# Patient Record
Sex: Female | Born: 1945
Health system: Southern US, Community
[De-identification: ages and names within clinical notes are randomized; demographics above are authoritative.]

## PROBLEM LIST (undated history)

## (undated) DIAGNOSIS — R112 Nausea with vomiting, unspecified: Secondary | ICD-10-CM

## (undated) DIAGNOSIS — I200 Unstable angina: Secondary | ICD-10-CM

## (undated) DIAGNOSIS — K649 Unspecified hemorrhoids: Secondary | ICD-10-CM

## (undated) DIAGNOSIS — G709 Myoneural disorder, unspecified: Secondary | ICD-10-CM

## (undated) DIAGNOSIS — Z860100 Personal history of colon polyps, unspecified: Secondary | ICD-10-CM

## (undated) DIAGNOSIS — E039 Hypothyroidism, unspecified: Secondary | ICD-10-CM

## (undated) DIAGNOSIS — T50905S Adverse effect of unspecified drugs, medicaments and biological substances, sequela: Secondary | ICD-10-CM

## (undated) DIAGNOSIS — M1990 Unspecified osteoarthritis, unspecified site: Secondary | ICD-10-CM

## (undated) DIAGNOSIS — H9190 Unspecified hearing loss, unspecified ear: Secondary | ICD-10-CM

## (undated) DIAGNOSIS — E785 Hyperlipidemia, unspecified: Secondary | ICD-10-CM

## (undated) DIAGNOSIS — R233 Spontaneous ecchymoses: Secondary | ICD-10-CM

## (undated) DIAGNOSIS — Z8701 Personal history of pneumonia (recurrent): Secondary | ICD-10-CM

## (undated) DIAGNOSIS — K5792 Diverticulitis of intestine, part unspecified, without perforation or abscess without bleeding: Secondary | ICD-10-CM

## (undated) DIAGNOSIS — R0600 Dyspnea, unspecified: Secondary | ICD-10-CM

## (undated) DIAGNOSIS — F329 Major depressive disorder, single episode, unspecified: Secondary | ICD-10-CM

## (undated) DIAGNOSIS — IMO0002 Reserved for concepts with insufficient information to code with codable children: Secondary | ICD-10-CM

## (undated) DIAGNOSIS — J069 Acute upper respiratory infection, unspecified: Secondary | ICD-10-CM

## (undated) DIAGNOSIS — Q211 Atrial septal defect: Secondary | ICD-10-CM

## (undated) DIAGNOSIS — Z8709 Personal history of other diseases of the respiratory system: Secondary | ICD-10-CM

## (undated) DIAGNOSIS — I10 Essential (primary) hypertension: Secondary | ICD-10-CM

## (undated) DIAGNOSIS — Z9889 Other specified postprocedural states: Secondary | ICD-10-CM

## (undated) DIAGNOSIS — G459 Transient cerebral ischemic attack, unspecified: Secondary | ICD-10-CM

## (undated) DIAGNOSIS — R51 Headache: Secondary | ICD-10-CM

## (undated) DIAGNOSIS — Z8669 Personal history of other diseases of the nervous system and sense organs: Secondary | ICD-10-CM

## (undated) DIAGNOSIS — R238 Other skin changes: Secondary | ICD-10-CM

## (undated) DIAGNOSIS — C50919 Malignant neoplasm of unspecified site of unspecified female breast: Secondary | ICD-10-CM

## (undated) DIAGNOSIS — J40 Bronchitis, not specified as acute or chronic: Secondary | ICD-10-CM

## (undated) DIAGNOSIS — R109 Unspecified abdominal pain: Secondary | ICD-10-CM

## (undated) DIAGNOSIS — R42 Dizziness and giddiness: Secondary | ICD-10-CM

## (undated) DIAGNOSIS — J189 Pneumonia, unspecified organism: Secondary | ICD-10-CM

## (undated) DIAGNOSIS — G4730 Sleep apnea, unspecified: Secondary | ICD-10-CM

## (undated) DIAGNOSIS — F32A Depression, unspecified: Secondary | ICD-10-CM

## (undated) DIAGNOSIS — K5732 Diverticulitis of large intestine without perforation or abscess without bleeding: Secondary | ICD-10-CM

## (undated) DIAGNOSIS — I639 Cerebral infarction, unspecified: Secondary | ICD-10-CM

## (undated) DIAGNOSIS — R002 Palpitations: Secondary | ICD-10-CM

## (undated) DIAGNOSIS — R351 Nocturia: Secondary | ICD-10-CM

## (undated) DIAGNOSIS — M542 Cervicalgia: Secondary | ICD-10-CM

## (undated) DIAGNOSIS — M47812 Spondylosis without myelopathy or radiculopathy, cervical region: Secondary | ICD-10-CM

## (undated) DIAGNOSIS — J029 Acute pharyngitis, unspecified: Secondary | ICD-10-CM

## (undated) DIAGNOSIS — Q2112 Patent foramen ovale: Secondary | ICD-10-CM

## (undated) HISTORY — DX: Hypothyroidism, unspecified: E03.9

## (undated) HISTORY — PX: BACK SURGERY: SHX140

## (undated) HISTORY — DX: Adverse effect of unspecified drugs, medicaments and biological substances, sequela: T50.905S

## (undated) HISTORY — DX: Personal history of other diseases of the nervous system and sense organs: Z86.69

## (undated) HISTORY — DX: Personal history of colon polyps, unspecified: Z86.0100

## (undated) HISTORY — DX: Patent foramen ovale: Q21.12

## (undated) HISTORY — DX: Diverticulitis of intestine, part unspecified, without perforation or abscess without bleeding: K57.92

## (undated) HISTORY — DX: Spondylosis without myelopathy or radiculopathy, cervical region: M47.812

## (undated) HISTORY — DX: Essential (primary) hypertension: I10

## (undated) HISTORY — DX: Atrial septal defect: Q21.1

## (undated) HISTORY — DX: Cerebral infarction, unspecified: I63.9

## (undated) HISTORY — DX: Malignant neoplasm of unspecified site of unspecified female breast: C50.919

## (undated) HISTORY — PX: UMBILICAL HERNIA REPAIR: SHX2598

## (undated) HISTORY — PX: ABDOMINAL HYSTERECTOMY: SHX81

## (undated) HISTORY — DX: Unstable angina: I20.0

## (undated) HISTORY — PX: EYE SURGERY: SHX253

## (undated) HISTORY — DX: Hyperlipidemia, unspecified: E78.5

## (undated) HISTORY — DX: Palpitations: R00.2

## (undated) HISTORY — DX: Personal history of colonic polyps: Z86.010

## (undated) HISTORY — DX: Unspecified osteoarthritis, unspecified site: M19.90

---

## 1981-09-19 HISTORY — PX: TUBAL LIGATION: SHX77

## 1998-12-08 ENCOUNTER — Encounter: Payer: Self-pay | Admitting: Surgery

## 1998-12-08 ENCOUNTER — Ambulatory Visit (HOSPITAL_COMMUNITY): Admission: RE | Admit: 1998-12-08 | Discharge: 1998-12-08 | Payer: Self-pay | Admitting: Surgery

## 1999-08-09 ENCOUNTER — Encounter: Admission: RE | Admit: 1999-08-09 | Discharge: 1999-08-09 | Payer: Self-pay | Admitting: Urology

## 1999-08-09 ENCOUNTER — Encounter: Payer: Self-pay | Admitting: Urology

## 2000-01-13 ENCOUNTER — Ambulatory Visit (HOSPITAL_COMMUNITY): Admission: RE | Admit: 2000-01-13 | Discharge: 2000-01-13 | Payer: Self-pay | Admitting: Surgery

## 2000-01-13 ENCOUNTER — Encounter: Payer: Self-pay | Admitting: Surgery

## 2000-01-21 ENCOUNTER — Ambulatory Visit: Admission: RE | Admit: 2000-01-21 | Discharge: 2000-01-21 | Payer: Self-pay | Admitting: Otolaryngology

## 2001-01-15 ENCOUNTER — Ambulatory Visit (HOSPITAL_COMMUNITY): Admission: RE | Admit: 2001-01-15 | Discharge: 2001-01-15 | Payer: Self-pay | Admitting: *Deleted

## 2001-01-15 ENCOUNTER — Encounter: Payer: Self-pay | Admitting: *Deleted

## 2001-07-31 ENCOUNTER — Other Ambulatory Visit: Admission: RE | Admit: 2001-07-31 | Discharge: 2001-07-31 | Payer: Self-pay | Admitting: *Deleted

## 2002-01-03 ENCOUNTER — Ambulatory Visit (HOSPITAL_COMMUNITY): Admission: RE | Admit: 2002-01-03 | Discharge: 2002-01-03 | Payer: Self-pay | Admitting: Orthopedic Surgery

## 2002-01-03 ENCOUNTER — Encounter: Payer: Self-pay | Admitting: Orthopedic Surgery

## 2002-09-10 ENCOUNTER — Ambulatory Visit (HOSPITAL_COMMUNITY): Admission: RE | Admit: 2002-09-10 | Discharge: 2002-09-10 | Payer: Self-pay | Admitting: *Deleted

## 2002-09-10 ENCOUNTER — Encounter: Payer: Self-pay | Admitting: *Deleted

## 2002-10-23 ENCOUNTER — Other Ambulatory Visit: Admission: RE | Admit: 2002-10-23 | Discharge: 2002-10-23 | Payer: Self-pay | Admitting: *Deleted

## 2002-12-28 ENCOUNTER — Encounter: Payer: Self-pay | Admitting: Orthopedic Surgery

## 2002-12-28 ENCOUNTER — Ambulatory Visit (HOSPITAL_COMMUNITY): Admission: RE | Admit: 2002-12-28 | Discharge: 2002-12-28 | Payer: Self-pay | Admitting: Orthopedic Surgery

## 2003-09-30 ENCOUNTER — Ambulatory Visit (HOSPITAL_COMMUNITY): Admission: RE | Admit: 2003-09-30 | Discharge: 2003-09-30 | Payer: Self-pay | Admitting: *Deleted

## 2003-11-22 ENCOUNTER — Ambulatory Visit (HOSPITAL_COMMUNITY): Admission: RE | Admit: 2003-11-22 | Discharge: 2003-11-22 | Payer: Self-pay | Admitting: Orthopedic Surgery

## 2003-12-25 ENCOUNTER — Other Ambulatory Visit: Admission: RE | Admit: 2003-12-25 | Discharge: 2003-12-25 | Payer: Self-pay | Admitting: *Deleted

## 2004-07-23 ENCOUNTER — Ambulatory Visit (HOSPITAL_COMMUNITY): Admission: RE | Admit: 2004-07-23 | Discharge: 2004-07-23 | Payer: Self-pay | Admitting: Neurosurgery

## 2004-08-09 ENCOUNTER — Ambulatory Visit (HOSPITAL_COMMUNITY): Admission: RE | Admit: 2004-08-09 | Discharge: 2004-08-09 | Payer: Self-pay | Admitting: Neurosurgery

## 2005-01-27 ENCOUNTER — Other Ambulatory Visit: Admission: RE | Admit: 2005-01-27 | Discharge: 2005-01-27 | Payer: Self-pay | Admitting: *Deleted

## 2005-03-02 ENCOUNTER — Ambulatory Visit (HOSPITAL_COMMUNITY): Admission: RE | Admit: 2005-03-02 | Discharge: 2005-03-02 | Payer: Self-pay | Admitting: *Deleted

## 2005-04-11 ENCOUNTER — Ambulatory Visit (HOSPITAL_COMMUNITY): Admission: RE | Admit: 2005-04-11 | Discharge: 2005-04-11 | Payer: Self-pay | Admitting: Neurosurgery

## 2005-04-25 ENCOUNTER — Ambulatory Visit (HOSPITAL_COMMUNITY): Admission: RE | Admit: 2005-04-25 | Discharge: 2005-04-25 | Payer: Self-pay | Admitting: Neurosurgery

## 2005-04-27 ENCOUNTER — Inpatient Hospital Stay (HOSPITAL_COMMUNITY): Admission: EM | Admit: 2005-04-27 | Discharge: 2005-05-01 | Payer: Self-pay | Admitting: Emergency Medicine

## 2005-04-28 ENCOUNTER — Encounter (INDEPENDENT_AMBULATORY_CARE_PROVIDER_SITE_OTHER): Payer: Self-pay | Admitting: *Deleted

## 2005-04-29 ENCOUNTER — Encounter (INDEPENDENT_AMBULATORY_CARE_PROVIDER_SITE_OTHER): Payer: Self-pay | Admitting: Cardiology

## 2005-06-02 ENCOUNTER — Encounter: Payer: Self-pay | Admitting: Internal Medicine

## 2005-06-08 ENCOUNTER — Encounter: Admission: RE | Admit: 2005-06-08 | Discharge: 2005-06-08 | Payer: Self-pay | Admitting: Neurology

## 2005-08-08 ENCOUNTER — Encounter: Payer: Self-pay | Admitting: Internal Medicine

## 2005-08-26 ENCOUNTER — Encounter: Payer: Self-pay | Admitting: Internal Medicine

## 2005-08-26 ENCOUNTER — Ambulatory Visit (HOSPITAL_COMMUNITY): Admission: RE | Admit: 2005-08-26 | Discharge: 2005-08-26 | Payer: Self-pay | Admitting: Cardiology

## 2005-09-27 ENCOUNTER — Ambulatory Visit: Payer: Self-pay | Admitting: Internal Medicine

## 2005-09-27 ENCOUNTER — Encounter: Admission: RE | Admit: 2005-09-27 | Discharge: 2005-09-27 | Payer: Self-pay | Admitting: Internal Medicine

## 2005-10-03 ENCOUNTER — Ambulatory Visit: Payer: Self-pay | Admitting: Internal Medicine

## 2005-10-07 ENCOUNTER — Encounter: Admission: RE | Admit: 2005-10-07 | Discharge: 2006-01-05 | Payer: Self-pay | Admitting: Internal Medicine

## 2005-11-01 ENCOUNTER — Ambulatory Visit: Payer: Self-pay | Admitting: Internal Medicine

## 2005-12-21 ENCOUNTER — Ambulatory Visit: Payer: Self-pay | Admitting: Internal Medicine

## 2006-01-20 ENCOUNTER — Ambulatory Visit: Payer: Self-pay | Admitting: Internal Medicine

## 2006-03-14 ENCOUNTER — Encounter: Admission: RE | Admit: 2006-03-14 | Discharge: 2006-03-14 | Payer: Self-pay | Admitting: *Deleted

## 2006-03-15 ENCOUNTER — Other Ambulatory Visit: Admission: RE | Admit: 2006-03-15 | Discharge: 2006-03-15 | Payer: Self-pay | Admitting: *Deleted

## 2006-06-20 ENCOUNTER — Ambulatory Visit: Payer: Self-pay | Admitting: Internal Medicine

## 2006-07-26 ENCOUNTER — Ambulatory Visit: Payer: Self-pay | Admitting: Internal Medicine

## 2006-08-24 ENCOUNTER — Encounter: Admission: RE | Admit: 2006-08-24 | Discharge: 2006-08-24 | Payer: Self-pay | Admitting: Orthopedic Surgery

## 2006-09-06 ENCOUNTER — Encounter: Admission: RE | Admit: 2006-09-06 | Discharge: 2006-10-25 | Payer: Self-pay | Admitting: Orthopedic Surgery

## 2006-11-13 ENCOUNTER — Ambulatory Visit: Payer: Self-pay | Admitting: Internal Medicine

## 2007-01-17 ENCOUNTER — Encounter: Admission: RE | Admit: 2007-01-17 | Discharge: 2007-01-17 | Payer: Self-pay | Admitting: *Deleted

## 2007-02-23 ENCOUNTER — Ambulatory Visit: Payer: Self-pay | Admitting: Internal Medicine

## 2007-02-28 ENCOUNTER — Encounter: Payer: Self-pay | Admitting: Internal Medicine

## 2007-03-06 DIAGNOSIS — M545 Low back pain: Secondary | ICD-10-CM | POA: Insufficient documentation

## 2007-03-06 DIAGNOSIS — I10 Essential (primary) hypertension: Secondary | ICD-10-CM

## 2007-03-06 DIAGNOSIS — E785 Hyperlipidemia, unspecified: Secondary | ICD-10-CM | POA: Insufficient documentation

## 2007-03-06 DIAGNOSIS — E039 Hypothyroidism, unspecified: Secondary | ICD-10-CM

## 2007-03-06 DIAGNOSIS — Z8679 Personal history of other diseases of the circulatory system: Secondary | ICD-10-CM | POA: Insufficient documentation

## 2007-03-09 ENCOUNTER — Ambulatory Visit: Payer: Self-pay | Admitting: Internal Medicine

## 2007-04-09 ENCOUNTER — Other Ambulatory Visit: Admission: RE | Admit: 2007-04-09 | Discharge: 2007-04-09 | Payer: Self-pay | Admitting: *Deleted

## 2007-04-11 ENCOUNTER — Encounter: Payer: Self-pay | Admitting: Internal Medicine

## 2007-04-11 ENCOUNTER — Encounter: Admission: RE | Admit: 2007-04-11 | Discharge: 2007-04-11 | Payer: Self-pay | Admitting: *Deleted

## 2007-04-18 ENCOUNTER — Encounter: Payer: Self-pay | Admitting: Internal Medicine

## 2007-04-18 ENCOUNTER — Encounter: Admission: RE | Admit: 2007-04-18 | Discharge: 2007-04-18 | Payer: Self-pay | Admitting: *Deleted

## 2007-04-20 ENCOUNTER — Ambulatory Visit: Payer: Self-pay | Admitting: Internal Medicine

## 2007-04-20 DIAGNOSIS — T887XXA Unspecified adverse effect of drug or medicament, initial encounter: Secondary | ICD-10-CM | POA: Insufficient documentation

## 2007-04-30 LAB — CONVERTED CEMR LAB
AST: 19 units/L (ref 0–37)
Cholesterol: 165 mg/dL (ref 0–200)
TSH: 0.5 microintl units/mL (ref 0.35–5.50)

## 2007-05-23 ENCOUNTER — Encounter: Payer: Self-pay | Admitting: Internal Medicine

## 2007-05-30 ENCOUNTER — Encounter: Payer: Self-pay | Admitting: Internal Medicine

## 2007-06-04 ENCOUNTER — Encounter: Payer: Self-pay | Admitting: Internal Medicine

## 2007-07-02 ENCOUNTER — Telehealth: Payer: Self-pay | Admitting: Internal Medicine

## 2007-07-23 ENCOUNTER — Ambulatory Visit: Payer: Self-pay | Admitting: Internal Medicine

## 2007-07-23 DIAGNOSIS — Z860100 Personal history of colon polyps, unspecified: Secondary | ICD-10-CM | POA: Insufficient documentation

## 2007-09-04 ENCOUNTER — Encounter: Payer: Self-pay | Admitting: Internal Medicine

## 2007-09-11 ENCOUNTER — Encounter: Payer: Self-pay | Admitting: Internal Medicine

## 2007-09-27 ENCOUNTER — Telehealth: Payer: Self-pay | Admitting: Internal Medicine

## 2007-09-28 ENCOUNTER — Ambulatory Visit: Payer: Self-pay | Admitting: Internal Medicine

## 2007-09-28 DIAGNOSIS — G459 Transient cerebral ischemic attack, unspecified: Secondary | ICD-10-CM | POA: Insufficient documentation

## 2007-09-28 HISTORY — DX: Transient cerebral ischemic attack, unspecified: G45.9

## 2007-11-20 ENCOUNTER — Ambulatory Visit: Payer: Self-pay | Admitting: Internal Medicine

## 2007-11-20 DIAGNOSIS — R1310 Dysphagia, unspecified: Secondary | ICD-10-CM

## 2007-11-22 ENCOUNTER — Encounter: Payer: Self-pay | Admitting: Internal Medicine

## 2007-11-23 ENCOUNTER — Encounter: Payer: Self-pay | Admitting: Internal Medicine

## 2007-11-26 ENCOUNTER — Encounter: Payer: Self-pay | Admitting: Internal Medicine

## 2007-11-28 ENCOUNTER — Encounter: Payer: Self-pay | Admitting: Internal Medicine

## 2007-12-11 ENCOUNTER — Ambulatory Visit: Payer: Self-pay | Admitting: Internal Medicine

## 2007-12-11 DIAGNOSIS — L723 Sebaceous cyst: Secondary | ICD-10-CM

## 2008-01-08 ENCOUNTER — Encounter: Payer: Self-pay | Admitting: Internal Medicine

## 2008-01-18 ENCOUNTER — Encounter: Payer: Self-pay | Admitting: Internal Medicine

## 2008-01-21 ENCOUNTER — Ambulatory Visit: Payer: Self-pay | Admitting: Internal Medicine

## 2008-01-30 ENCOUNTER — Encounter: Admission: RE | Admit: 2008-01-30 | Discharge: 2008-01-30 | Payer: Self-pay | Admitting: Neurology

## 2008-02-06 ENCOUNTER — Ambulatory Visit: Payer: Self-pay | Admitting: Internal Medicine

## 2008-02-12 ENCOUNTER — Ambulatory Visit: Payer: Self-pay | Admitting: Internal Medicine

## 2008-02-12 DIAGNOSIS — G44209 Tension-type headache, unspecified, not intractable: Secondary | ICD-10-CM

## 2008-02-14 ENCOUNTER — Telehealth (INDEPENDENT_AMBULATORY_CARE_PROVIDER_SITE_OTHER): Payer: Self-pay

## 2008-02-15 ENCOUNTER — Emergency Department (HOSPITAL_COMMUNITY): Admission: EM | Admit: 2008-02-15 | Discharge: 2008-02-16 | Payer: Self-pay | Admitting: Emergency Medicine

## 2008-02-27 ENCOUNTER — Ambulatory Visit: Payer: Self-pay | Admitting: Internal Medicine

## 2008-02-28 DIAGNOSIS — R51 Headache: Secondary | ICD-10-CM

## 2008-03-03 ENCOUNTER — Encounter: Payer: Self-pay | Admitting: Internal Medicine

## 2008-04-07 ENCOUNTER — Telehealth: Payer: Self-pay | Admitting: Internal Medicine

## 2008-04-11 ENCOUNTER — Encounter: Admission: RE | Admit: 2008-04-11 | Discharge: 2008-04-11 | Payer: Self-pay | Admitting: Gynecology

## 2008-04-23 ENCOUNTER — Encounter: Payer: Self-pay | Admitting: Internal Medicine

## 2008-04-28 ENCOUNTER — Encounter: Payer: Self-pay | Admitting: Internal Medicine

## 2008-04-28 ENCOUNTER — Other Ambulatory Visit: Admission: RE | Admit: 2008-04-28 | Discharge: 2008-04-28 | Payer: Self-pay | Admitting: Gynecology

## 2008-05-15 ENCOUNTER — Ambulatory Visit: Payer: Self-pay | Admitting: Internal Medicine

## 2008-05-15 DIAGNOSIS — R599 Enlarged lymph nodes, unspecified: Secondary | ICD-10-CM | POA: Insufficient documentation

## 2008-06-13 ENCOUNTER — Encounter: Payer: Self-pay | Admitting: Internal Medicine

## 2008-07-10 ENCOUNTER — Telehealth: Payer: Self-pay | Admitting: Family Medicine

## 2008-07-15 ENCOUNTER — Telehealth: Payer: Self-pay | Admitting: Internal Medicine

## 2008-09-02 ENCOUNTER — Ambulatory Visit: Payer: Self-pay | Admitting: Internal Medicine

## 2008-09-02 DIAGNOSIS — J069 Acute upper respiratory infection, unspecified: Secondary | ICD-10-CM | POA: Insufficient documentation

## 2008-09-02 HISTORY — DX: Acute upper respiratory infection, unspecified: J06.9

## 2008-09-04 ENCOUNTER — Telehealth (INDEPENDENT_AMBULATORY_CARE_PROVIDER_SITE_OTHER): Payer: Self-pay

## 2008-09-23 ENCOUNTER — Telehealth: Payer: Self-pay | Admitting: Internal Medicine

## 2008-09-24 ENCOUNTER — Telehealth: Payer: Self-pay | Admitting: Internal Medicine

## 2008-09-30 ENCOUNTER — Encounter: Payer: Self-pay | Admitting: Internal Medicine

## 2008-10-01 ENCOUNTER — Encounter: Admission: RE | Admit: 2008-10-01 | Discharge: 2008-10-01 | Payer: Self-pay | Admitting: Gastroenterology

## 2008-10-03 ENCOUNTER — Encounter: Payer: Self-pay | Admitting: Internal Medicine

## 2008-10-14 ENCOUNTER — Encounter: Payer: Self-pay | Admitting: Internal Medicine

## 2008-10-15 ENCOUNTER — Encounter: Payer: Self-pay | Admitting: Internal Medicine

## 2008-12-24 ENCOUNTER — Encounter: Payer: Self-pay | Admitting: Internal Medicine

## 2008-12-24 ENCOUNTER — Encounter: Admission: RE | Admit: 2008-12-24 | Discharge: 2008-12-24 | Payer: Self-pay | Admitting: Gastroenterology

## 2008-12-30 ENCOUNTER — Encounter: Payer: Self-pay | Admitting: Internal Medicine

## 2009-01-14 ENCOUNTER — Encounter: Payer: Self-pay | Admitting: Internal Medicine

## 2009-01-28 ENCOUNTER — Encounter: Payer: Self-pay | Admitting: Internal Medicine

## 2009-04-13 ENCOUNTER — Encounter: Admission: RE | Admit: 2009-04-13 | Discharge: 2009-04-13 | Payer: Self-pay | Admitting: Gynecology

## 2009-04-30 ENCOUNTER — Ambulatory Visit: Payer: Self-pay | Admitting: Internal Medicine

## 2009-04-30 DIAGNOSIS — J029 Acute pharyngitis, unspecified: Secondary | ICD-10-CM

## 2009-04-30 HISTORY — DX: Acute pharyngitis, unspecified: J02.9

## 2009-04-30 LAB — CONVERTED CEMR LAB: Rapid Strep: NEGATIVE

## 2009-05-07 ENCOUNTER — Encounter: Payer: Self-pay | Admitting: Internal Medicine

## 2009-06-01 ENCOUNTER — Encounter (INDEPENDENT_AMBULATORY_CARE_PROVIDER_SITE_OTHER): Payer: Self-pay

## 2009-08-03 ENCOUNTER — Telehealth: Payer: Self-pay | Admitting: Internal Medicine

## 2009-08-04 ENCOUNTER — Ambulatory Visit: Payer: Self-pay | Admitting: Internal Medicine

## 2009-08-04 LAB — CONVERTED CEMR LAB: Rapid Strep: NEGATIVE

## 2009-08-10 ENCOUNTER — Telehealth: Payer: Self-pay | Admitting: Internal Medicine

## 2009-08-20 ENCOUNTER — Encounter (INDEPENDENT_AMBULATORY_CARE_PROVIDER_SITE_OTHER): Payer: Self-pay | Admitting: *Deleted

## 2009-10-02 ENCOUNTER — Encounter: Payer: Self-pay | Admitting: Internal Medicine

## 2009-10-14 ENCOUNTER — Telehealth: Payer: Self-pay | Admitting: Internal Medicine

## 2009-10-19 ENCOUNTER — Encounter: Payer: Self-pay | Admitting: Internal Medicine

## 2009-12-28 ENCOUNTER — Ambulatory Visit: Payer: Self-pay | Admitting: Internal Medicine

## 2010-02-10 ENCOUNTER — Ambulatory Visit: Payer: Self-pay | Admitting: Internal Medicine

## 2010-02-10 DIAGNOSIS — K5732 Diverticulitis of large intestine without perforation or abscess without bleeding: Secondary | ICD-10-CM

## 2010-02-10 HISTORY — DX: Diverticulitis of large intestine without perforation or abscess without bleeding: K57.32

## 2010-02-10 LAB — CONVERTED CEMR LAB
Basophils Absolute: 0 10*3/uL (ref 0.0–0.1)
Chloride: 104 meq/L (ref 96–112)
Creatinine, Ser: 0.7 mg/dL (ref 0.4–1.2)
Eosinophils Absolute: 0 10*3/uL (ref 0.0–0.7)
Lymphocytes Relative: 21.2 % (ref 12.0–46.0)
MCHC: 35 g/dL (ref 30.0–36.0)
Neutro Abs: 6.5 10*3/uL (ref 1.4–7.7)
Neutrophils Relative %: 70.7 % (ref 43.0–77.0)
Platelets: 174 10*3/uL (ref 150.0–400.0)
RDW: 13.2 % (ref 11.5–14.6)
Sodium: 141 meq/L (ref 135–145)

## 2010-02-12 ENCOUNTER — Ambulatory Visit: Payer: Self-pay | Admitting: Internal Medicine

## 2010-04-15 ENCOUNTER — Encounter: Admission: RE | Admit: 2010-04-15 | Discharge: 2010-04-15 | Payer: Self-pay | Admitting: Gynecology

## 2010-05-06 ENCOUNTER — Encounter: Payer: Self-pay | Admitting: Internal Medicine

## 2010-05-07 ENCOUNTER — Ambulatory Visit: Payer: Self-pay | Admitting: Internal Medicine

## 2010-05-20 ENCOUNTER — Telehealth: Payer: Self-pay | Admitting: Internal Medicine

## 2010-05-20 DIAGNOSIS — R079 Chest pain, unspecified: Secondary | ICD-10-CM

## 2010-05-27 ENCOUNTER — Ambulatory Visit: Payer: Self-pay | Admitting: Internal Medicine

## 2010-05-27 DIAGNOSIS — R002 Palpitations: Secondary | ICD-10-CM | POA: Insufficient documentation

## 2010-05-28 ENCOUNTER — Encounter: Payer: Self-pay | Admitting: Internal Medicine

## 2010-06-01 ENCOUNTER — Telehealth (INDEPENDENT_AMBULATORY_CARE_PROVIDER_SITE_OTHER): Payer: Self-pay | Admitting: *Deleted

## 2010-06-08 ENCOUNTER — Ambulatory Visit: Payer: Self-pay | Admitting: Internal Medicine

## 2010-06-08 ENCOUNTER — Encounter: Payer: Self-pay | Admitting: Internal Medicine

## 2010-06-08 DIAGNOSIS — R072 Precordial pain: Secondary | ICD-10-CM

## 2010-06-09 LAB — CONVERTED CEMR LAB
Chloride: 108 meq/L (ref 96–112)
Eosinophils Relative: 3.9 % (ref 0.0–5.0)
GFR calc non Af Amer: 88.69 mL/min (ref >60)
Glucose, Bld: 88 mg/dL (ref 70–99)
HCT: 36.9 % (ref 36.0–46.0)
Hemoglobin: 12.7 g/dL (ref 12.0–15.0)
Lymphs Abs: 2.2 10*3/uL (ref 0.7–4.0)
Monocytes Relative: 8 % (ref 3.0–12.0)
Neutro Abs: 2.9 10*3/uL (ref 1.4–7.7)
Potassium: 3.9 meq/L (ref 3.5–5.1)
RDW: 14 % (ref 11.5–14.6)
Sodium: 140 meq/L (ref 135–145)
WBC: 5.8 10*3/uL (ref 4.5–10.5)

## 2010-06-11 ENCOUNTER — Inpatient Hospital Stay (HOSPITAL_BASED_OUTPATIENT_CLINIC_OR_DEPARTMENT_OTHER): Admission: RE | Admit: 2010-06-11 | Discharge: 2010-06-11 | Payer: Self-pay | Admitting: Internal Medicine

## 2010-06-11 ENCOUNTER — Ambulatory Visit: Payer: Self-pay | Admitting: Internal Medicine

## 2010-06-17 ENCOUNTER — Ambulatory Visit: Payer: Self-pay

## 2010-06-17 ENCOUNTER — Ambulatory Visit (HOSPITAL_COMMUNITY): Admission: RE | Admit: 2010-06-17 | Discharge: 2010-06-17 | Payer: Self-pay | Admitting: Internal Medicine

## 2010-06-17 ENCOUNTER — Ambulatory Visit: Payer: Self-pay | Admitting: Cardiology

## 2010-07-07 ENCOUNTER — Telehealth: Payer: Self-pay | Admitting: Internal Medicine

## 2010-07-08 ENCOUNTER — Encounter: Payer: Self-pay | Admitting: Internal Medicine

## 2010-07-14 ENCOUNTER — Telehealth: Payer: Self-pay | Admitting: Internal Medicine

## 2010-07-29 ENCOUNTER — Encounter: Payer: Self-pay | Admitting: Internal Medicine

## 2010-08-18 ENCOUNTER — Encounter: Payer: Self-pay | Admitting: Internal Medicine

## 2010-08-24 ENCOUNTER — Telehealth: Payer: Self-pay

## 2010-08-30 ENCOUNTER — Telehealth: Payer: Self-pay | Admitting: Internal Medicine

## 2010-09-21 ENCOUNTER — Ambulatory Visit
Admission: RE | Admit: 2010-09-21 | Discharge: 2010-09-21 | Payer: Self-pay | Source: Home / Self Care | Attending: Internal Medicine | Admitting: Internal Medicine

## 2010-09-21 DIAGNOSIS — M542 Cervicalgia: Secondary | ICD-10-CM

## 2010-09-21 DIAGNOSIS — R109 Unspecified abdominal pain: Secondary | ICD-10-CM | POA: Insufficient documentation

## 2010-09-21 HISTORY — DX: Unspecified abdominal pain: R10.9

## 2010-09-21 HISTORY — DX: Cervicalgia: M54.2

## 2010-10-19 NOTE — Progress Notes (Signed)
Summary: labs from gyn  Phone Note Call from Patient   Caller: Patient Call For: Gordy Savers  MD Reason for Call: Lab or Test Results Summary of Call: we should of recv'd labs from gyn appt - faxed on 12/2 - tsh medication needs to be adjusted per pt.  [facility].   Initial call taken by: Duard Brady LPN,  August 24, 2010 5:13 PM  Follow-up for Phone Call        I have not seen any fax r/t to this results - please advise Follow-up by: Duard Brady LPN,  August 24, 2010 5:15 PM  Additional Follow-up for Phone Call Additional follow up Details #1::        ask gyn to fax lab please Additional Follow-up by: Gordy Savers  MD,  August 24, 2010 5:38 PM    Additional Follow-up for Phone Call Additional follow up Details #2::    attempt to call pt - ans mach - LMTCB - need name and number for GYN to obtain labs - still have not recv'd - ok to leav info on VM. KIK Follow-up by: Duard Brady LPN,  August 27, 2010 1:34 PM

## 2010-10-19 NOTE — Progress Notes (Signed)
Summary: monitor  Phone Note Outgoing Call Call back at [facility] Phone [phone]   Call placed by: Marcos Eke,  June 01, 2010 4:54 PM Action Taken: Appt scheduled Summary of Call: Pt will have monitor put on the same day of Lab 06/08/10 .

## 2010-10-19 NOTE — Assessment & Plan Note (Signed)
Summary: np6/ chestpain. pt has medicare, blue cross state. gd   Visit Type:  Initial Consult Primary Provider:  Gordy Savers  MD  CC:  shortness of breath.  History of Present Illness: 65 y/o with h/p HTN, HL, chronic HAs, CVA hypothyroidism and depression. Referred by Dr. Amador Cunas for further evalaution of CP.  Does not have a known h/o CAD. Has never had a cath. Had exercise treadmill test in 2009 with Dr. Jacinto Halim which was normal.   Had bicerberal CVA in 2006 and found to have PFO on TEE. Referred to Dr. Jacinto Halim but he was unable to close PFO due to small size. In 2009 was having episodes of dizziness and being pulled to her left side. There was question of vertebrobasilar TIAs. MRI mildly abnormal.  Over past year, has had episodes where she feels like she is having an out of body experience. Chest gets tight and she can't breathe. Feels her heart racing. Then feels presyncopal. Episodes occur 2-3x/week. Can come and go at any time. No nocturnal episdoes. Walks on treadmill for 3-4x week. Occasional dyspneic but hasn't hadone of her episodes while walking on a treadmill. Feels like episodes are getting more frequent. Denies panic attacks. Has worn a heart monitor for AF surveillance in 2009 and it was normal.   Preventive Screening-Counseling & Management  Alcohol-Tobacco     Smoking Status: quit  Caffeine-Diet-Exercise     Does Patient Exercise: yes      Drug Use:  yes.    Problems Prior to Update: 1)  Palpitations  (ICD-785.1) 2)  Chest Pain Unspecified  (ICD-786.50) 3)  Diverticulitis, Acute  (ICD-562.11) 4)  Sore Throat  (ICD-462) 5)  Uri  (ICD-465.9) 6)  Cervical Lymphadenopathy, Right  (ICD-785.6) 7)  Headache  (ICD-784.0) 8)  Tension Headache  (ICD-307.81) 9)  Sebaceous Cyst, Infected  (ICD-706.2) 10)  Sebaceous Cyst, Neck  (ICD-706.2) 11)  Problems With Swallowing and Mastication  (ICD-V41.6) 12)  Tia  (ICD-435.9) 13)  Diverticulosis, Colon   (ICD-562.10) 14)  Colonic Polyps, Hx of  (ICD-V12.72) 15)  Advef, Drug/medicinal/biological Subst Nos  (ICD-995.20) 16)  Hyperlipidemia  (ICD-272.4) 17)  Cerebrovascular Accident, Hx of  (ICD-V12.50) 18)  Low Back Pain  (ICD-724.2) 19)  Hypothyroidism  (ICD-244.9) 20)  Hypertension  (ICD-401.9)  Medications Prior to Update: 1)  Levoxyl 125 Mcg Tabs (Levothyroxine Sodium) .... Take 1 Tablet By Mouth Once A Day 2)  Plavix 75 Mg Tabs (Clopidogrel Bisulfate) .... Take 1 Tablet By Mouth Once A Day 3)  Verapamil Hcl Cr 360 Mg Cp24 (Verapamil Hcl) .... Take 1 Once Daily 4)  Simvastatin 40 Mg  Tabs (Simvastatin) .Marland Kitchen.. 1 Once Daily 5)  Fioricet 50-325-40 Mg  Tabs (Butalbital-Apap-Caffeine) .Marland Kitchen.. 1 Q6h As Needed 6)  Meclizine Hcl 25 Mg Tabs (Meclizine Hcl) .... One Tab Every 4-6 Hours As Needed For Dizziness 7)  Diclofenac Sodium 50 Mg Tbec (Diclofenac Sodium) .... One By Mouth Bid 8)  Tramadol Hcl 50 Mg Tabs (Tramadol Hcl) .... One Every 6 Hours As Needed For Pain 9)  Celebrex 200 Mg Caps (Celecoxib) .... Two Times A Day - Given Samples By Ortho  Current Medications (verified): 1)  Levoxyl 125 Mcg Tabs (Levothyroxine Sodium) .... Take 1 Tablet By Mouth Once A Day 2)  Plavix 75 Mg Tabs (Clopidogrel Bisulfate) .... Take 1 Tablet By Mouth Once A Day 3)  Verapamil Hcl Cr 360 Mg Cp24 (Verapamil Hcl) .... Take 1 Once Daily 4)  Simvastatin 40 Mg  Tabs (  Simvastatin) .Marland Kitchen.. 1 Once Daily 5)  Fioricet 50-325-40 Mg  Tabs (Butalbital-Apap-Caffeine) .Marland Kitchen.. 1 Q6h As Needed 6)  Meclizine Hcl 25 Mg Tabs (Meclizine Hcl) .... One Tab Every 4-6 Hours As Needed For Dizziness 7)  Diclofenac Sodium 50 Mg Tbec (Diclofenac Sodium) .... One By Mouth Two Times A Day When Not On Celebrex 8)  Celebrex 200 Mg Caps (Celecoxib) .... Two Times A Day - Given Samples By Ortho  Allergies (verified): 1)  ! Demerol 2)  Morphine Sulfate (Morphine Sulfate) 3)  Codeine Phosphate (Codeine Phosphate)  Past History:  Family  History: Last updated: 08/03/2007 father died age 72, prostate cancer mother died  52, following a stroke two sisters positive for breast cancer  Social History: Last updated: 05/27/2010 Retired  Married  Tobacco Use - Former.  quit 1960's Alcohol Use - yes -- maybe 2 a month if that Regular Exercise - yes -- treadmill Drug Use - yes -- last in 1980's  Risk Factors: Exercise: yes (05/27/2010)  Risk Factors: Smoking Status: quit (05/27/2010)  Past Medical History: Reviewed history from 05/27/2010 and no changes required. 1. Hypertension 2. Hypothyroidism 3. Low back pain 4. Neck pain 5. Cerebrovascular accident, hx of 06 6. CVD 7. Hyperlipidemia 8. Colonic polyps, hx of 9. Diverticulosis, colon 10. Headache 11. Acute diverticulitis  Past Surgical History: colonoscopy in October 2008 heart catheterization 12 2006 Abdominal Hysterectomy-Total Thyroidectomy Elbow ligament repair  Family History: Reviewed history from 08/03/07 and no changes required. father died age 10, prostate cancer mother died  37, following a stroke two sisters positive for breast cancer  Social History: Reviewed history and no changes required. Retired  Married  Tobacco Use - Former.  quit 1960's Alcohol Use - yes -- maybe 2 a month if that Regular Exercise - yes -- treadmill Drug Use - yes -- last in 1980's Does Patient Exercise:  yes Drug Use:  yes  Review of Systems       As per HPI and past medical history; otherwise all systems negative.   Vital Signs:  Patient profile:   65 year old female Height:      65 inches Weight:      190 pounds BMI:     31.73 Pulse rate:   55 / minute BP sitting:   118 / 78  (right arm) Cuff size:   regular  Vitals Entered By: Hardin Negus, RMA (May 27, 2010 10:53 AM)  Physical Exam  General:  Well appearing. no resp difficulty HEENT: normal Neck: supple. no JVD. Carotids 2+ bilat; no bruits. No lymphadenopathy or thryomegaly  appreciated. Cor: PMI nondisplaced. Regular rate & rhythm. No rubs, gallops, murmur. Lungs: clear Abdomen: soft, nontender, nondistended. No hepatosplenomegaly. No bruits or masses. Good bowel sounds. Extremities: no cyanosis, clubbing, rash, edema Neuro: alert & orientedx3, cranial nerves grossly intact. moves all 4 extremities w/o difficulty. affect flat   Impression & Recommendations:  Problem # 1:  CHEST PAIN UNSPECIFIED (ICD-786.50) and PALPITATIONS Symptoms are mostly atypical but does have some typical features. We discussed repeat stress testing vs cardiac catheterization and she favors cardiac cath for a definitive diagnosis as she is very concerned about her symptoms. We will also place a 2 week event monitor to assess for underlying arrhythmia. We did discuss the possibility that these may be panic attacks but she did not feel that this was the case.   Other Orders: EKG w/ Interpretation (93000) Cardiac Catheterization (Cardiac Cath) Event (Event)  Patient Instructions: 1)  Your physician  recommends that you return for lab work in: week of 9/19 (bmet, cbc, pt 786.51) 2)  Your physician has requested that you have a cardiac catheterization.  Cardiac catheterization is used to diagnose and/or treat various heart conditions. Doctors may recommend this procedure for a number of different reasons. The most common reason is to evaluate chest pain. Chest pain can be a symptom of coronary artery disease (CAD), and cardiac catheterization can show whether plaque is narrowing or blocking your heart's arteries. This procedure is also used to evaluate the valves, as well as measure the blood flow and oxygen levels in different parts of your heart.  For further information please visit [email or web address]  Please follow instruction sheet, as given. 3)  Your physician has recommended that you wear an event monitor.  Event monitors are medical devices that record the heart's electrical activity.  Doctors most often use these monitors to diagnose arrhythmias. Arrhythmias are problems with the speed or rhythm of the heartbeat. The monitor is a small, portable device. You can wear one while you do your normal daily activities. This is usually used to diagnose what is causing palpitations/syncope (passing out).

## 2010-10-19 NOTE — Assessment & Plan Note (Signed)
Summary: 2 day rov/njr   Vital Signs:  Patient profile:   65 year old female Weight:      190 pounds Temp:     98.3 degrees F oral BP sitting:   100 / 70  (right arm) Cuff size:   regular  Vitals Entered By: Duard Brady LPN (Feb 12, 2010 10:47 AM) CC: 2 day f/u - improving Is Patient Diabetic? No   CC:  2 day f/u - improving.  History of Present Illness: 65 year old patient who is seen today for follow-up of acute diverticulitis.  She  presently is on Cipro and metronidazole, which she continues to tolerate.  Her pain is much improved, and she is tolerating a soft diet without difficulty.  There is been no fever or chills.  She is much better compared to two days ago.  White blood cell count at that time was normal.  No nausea or vomiting  Allergies: 1)  ! Demerol 2)  Morphine Sulfate (Morphine Sulfate) 3)  Codeine Phosphate (Codeine Phosphate)  Past History:  Past Medical History: Reviewed history from 02/10/2010 and no changes required. Hypertension Hypothyroidism Low back pain Neck pain Cerebrovascular accident, hx of 06 CVD Hyperlipidemia Colonic polyps, hx of Diverticulosis, colon Headache acute diverticulitis  Review of Systems       The patient complains of abdominal pain.  The patient denies anorexia, fever, weight loss, weight gain, vision loss, decreased hearing, hoarseness, chest pain, syncope, dyspnea on exertion, peripheral edema, prolonged cough, headaches, hemoptysis, melena, hematochezia, severe indigestion/heartburn, hematuria, incontinence, genital sores, muscle weakness, suspicious skin lesions, transient blindness, difficulty walking, depression, unusual weight change, abnormal bleeding, enlarged lymph nodes, angioedema, and breast masses.    Physical Exam  General:  Well-developed,well-nourished,in no acute distress; alert,appropriate and cooperative throughout examination Head:  Normocephalic and atraumatic without obvious abnormalities.  No apparent alopecia or balding. Eyes:  No corneal or conjunctival inflammation noted. EOMI. Perrla. Funduscopic exam benign, without hemorrhages, exudates or papilledema. Vision grossly normal. Mouth:  Oral mucosa and oropharynx without lesions or exudates.  Teeth in good repair. Neck:  No deformities, masses, or tenderness noted. Lungs:  Normal respiratory effort, chest expands symmetrically. Lungs are clear to auscultation, no crackles or wheezes. Heart:  Normal rate and regular rhythm. S1 and S2 normal without gallop, murmur, click, rub or other extra sounds. Abdomen:  still with considerable lower quadrant tenderness, probably most marked in the lower midline.  Bowel sounds remain active.  No guarding is present, but persistent mild rebound tenderness noted Msk:  No deformity or scoliosis noted of thoracic or lumbar spine.     Impression & Recommendations:  Problem # 1:  DIVERTICULITIS, ACUTE (ICD-562.11) modestly improved.  Will continue antibiotic regimen, and slowly advance diet.  If pain courses or she develops fever or chills, will evaluate in the emergency department  Problem # 2:  HYPERTENSION (ICD-401.9)  Her updated medication list for this problem includes:    Verapamil Hcl Cr 360 Mg Cp24 (Verapamil hcl) .Marland Kitchen... Take 1 once daily  Complete Medication List: 1)  Levoxyl 125 Mcg Tabs (Levothyroxine sodium) .... Take 1 tablet by mouth once a day 2)  Plavix 75 Mg Tabs (Clopidogrel bisulfate) .... Take 1 tablet by mouth once a day 3)  Verapamil Hcl Cr 360 Mg Cp24 (Verapamil hcl) .... Take 1 once daily 4)  Simvastatin 40 Mg Tabs (Simvastatin) .Marland Kitchen.. 1 once daily 5)  Fioricet 50-325-40 Mg Tabs (Butalbital-apap-caffeine) .Marland Kitchen.. 1 q6h as needed 6)  Meclizine Hcl 25 Mg Tabs (Meclizine  hcl) .... One tab every 4-6 hours as needed for dizziness 7)  Diclofenac Sodium 50 Mg Tbec (Diclofenac sodium) .... One by mouth bid 8)  Metronidazole 500 Mg Tabs (Metronidazole) .... One tablet 3 times  daily 9)  Tramadol Hcl 50 Mg Tabs (Tramadol hcl) .... One every 6 hours as needed for pain 10)  Cipro 500mg   .... Bid  Patient Instructions: 1)  Drink clear liquids only for the next 24 hours, then slowly add other liquids and food as you  tolerate them. 2)  call if pain worsens or  you  develop fever

## 2010-10-19 NOTE — Letter (Signed)
Summary: [facility]   Imported By: Maryln Gottron 10/27/2009 11:27:36  _____________________________________________________________________  External Attachment:    Type:   Image     Comment:   External Document

## 2010-10-19 NOTE — Cardiovascular Report (Signed)
Summary: Pre Cath Orders   Pre Cath Orders   Imported By: Roderic Ovens 06/07/2010 13:37:26  _____________________________________________________________________  External Attachment:    Type:   Image     Comment:   External Document

## 2010-10-19 NOTE — Assessment & Plan Note (Signed)
Summary: DIVERTICULITIS FLARE UP/CJR   Vital Signs:  Patient profile:   65 year old female Weight:      190 pounds Temp:     100.0 degrees F oral BP sitting:   114 / 70  (left arm) Cuff size:   regular  Vitals Entered By: Duard Brady LPN (Feb 10, 2010 11:13 AM) CC: c/o abd pain - diverticulosis, fever, took 2 fioricet this AM , Gi rx'd cipro yesterday Is Patient Diabetic? No   CC:  c/o abd pain - diverticulosis, fever, took 2 fioricet this AM , and Gi rx'd cipro yesterday.  History of Present Illness: a 65 year old patient who has a history of diverticular disease, who presents with a 3-day history of lower abdominal pain.  There has been some low-grade fever.  Denies any nausea or vomiting or change in her bowel habits.  She has been on Cipro for 3 doses as prescribed by her GI consultant, but has reluctant to take Flagyl due to a history of mild intolerance.  She does not recall exactly her side effects, but clearly did not have a significant allergic reaction.  She states that she would really like to avoid a CT abdominal scan today if possible.  Allergies: 1)  ! Demerol 2)  Morphine Sulfate (Morphine Sulfate) 3)  Codeine Phosphate (Codeine Phosphate)  Past History:  Past Medical History: Hypertension Hypothyroidism Low back pain Neck pain Cerebrovascular accident, hx of 06 CVD Hyperlipidemia Colonic polyps, hx of Diverticulosis, colon Headache acute diverticulitis  Review of Systems       The patient complains of anorexia, fever, and abdominal pain.  The patient denies weight loss, weight gain, vision loss, decreased hearing, hoarseness, chest pain, syncope, dyspnea on exertion, peripheral edema, prolonged cough, headaches, hemoptysis, melena, hematochezia, severe indigestion/heartburn, hematuria, incontinence, genital sores, muscle weakness, suspicious skin lesions, transient blindness, difficulty walking, depression, unusual weight change, abnormal bleeding,  enlarged lymph nodes, angioedema, and breast masses.    Physical Exam  General:  appears unwell, but in no acute distress.  Temperature 100 degrees Head:  Normocephalic and atraumatic without obvious abnormalities. No apparent alopecia or balding. Eyes:  No corneal or conjunctival inflammation noted. EOMI. Perrla. Funduscopic exam benign, without hemorrhages, exudates or papilledema. Vision grossly normal. Mouth:  Oral mucosa and oropharynx without lesions or exudates.  Teeth in good repair. Neck:  No deformities, masses, or tenderness noted. Lungs:  Normal respiratory effort, chest expands symmetrically. Lungs are clear to auscultation, no crackles or wheezes. Heart:  Normal rate and regular rhythm. S1 and S2 normal without gallop, murmur, click, rub or other extra sounds. no tachycardia Abdomen:  no distention.  Bowel sounds are quite active she did have a mild tenderness in the lower abdominal quadrants, left greater than the right or midline; mild rebound tenderness noted.  No guarding   Impression & Recommendations:  Problem # 1:  DIVERTICULITIS, ACUTE (ICD-562.11)  Wilcher with analgesics add metronidazole, and observe closely.  Next 24 hours.  If she is unimproved tomorrow.  Will obtain an abdominal - pelvic CT.  Will check a CBC and also a renal indices anticipating the need for a CT scan  Orders: Prescription Created Electronically [id number]) Venipuncture (60454) Demerol / Phenergan Injection (U9811) Admin of Therapeutic Inj  intramuscular or subcutaneous (91478) TLB-CBC Platelet - w/Differential (85025-CBCD) TLB-BMP (Basic Metabolic Panel-BMET) (80048-METABOL)  Complete Medication List: 1)  Levoxyl 125 Mcg Tabs (Levothyroxine sodium) .... Take 1 tablet by mouth once a day 2)  Plavix 75 Mg Tabs (Clopidogrel  bisulfate) .... Take 1 tablet by mouth once a day 3)  Verapamil Hcl Cr 360 Mg Cp24 (Verapamil hcl) .... Take 1 once daily 4)  Simvastatin 40 Mg Tabs (Simvastatin) .Marland Kitchen.. 1  once daily 5)  Fioricet 50-325-40 Mg Tabs (Butalbital-apap-caffeine) .Marland Kitchen.. 1 q6h as needed 6)  Meclizine Hcl 25 Mg Tabs (Meclizine hcl) .... One tab every 4-6 hours as needed for dizziness 7)  Diclofenac Sodium 50 Mg Tbec (Diclofenac sodium) .... One by mouth bid 8)  Cephalexin 500 Mg Caps (Cephalexin) .... One twice daily 9)  Metronidazole 500 Mg Tabs (Metronidazole) .... One tablet 3 times daily 10)  Tramadol Hcl 50 Mg Tabs (Tramadol hcl) .... One every 6 hours as needed for pain  Patient Instructions: 1)  Drink clear liquids only for the next 24 hours, then slowly add other liquids and food as you  tolerate them. 2)  call immediately if pain or symptoms worsen 3)  return in 48 hours for follow-up 4)  Take your antibiotic as prescribed until ALL of it is gone, but stop if you develop a rash or swelling and contact our office as soon as possible. Prescriptions: TRAMADOL HCL 50 MG TABS (TRAMADOL HCL) one every 6 hours as needed for pain  #50 x 0   Entered and Authorized by:   Gordy Savers  MD   Signed by:   Gordy Savers  MD on 02/10/2010   Method used:   Electronically to        [facility] [phone]* (retail)       [address]       [place], [place]  [address]       Ph: [phone] or [phone]       Fax: [phone]   RxID:   [id number] METRONIDAZOLE 500 MG TABS (METRONIDAZOLE) one tablet 3 times daily  #30 x 0   Entered and Authorized by:   Gordy Savers  MD   Signed by:   Gordy Savers  MD on 02/10/2010   Method used:   Electronically to        [facility] [phone]* (retail)       [address]       [place], [place]  [address]       Ph: [phone] or [phone]       Fax: [phone]   RxID:   [id number]    Medication Administration  Injection # 1:    Medication: Demerol / Phenergan Injection    Diagnosis: DIVERTICULITIS, ACUTE (ICD-562.11)    Route: IM    Site: RUOQ  gluteus    Exp Date: 11/18/2010    Lot #: [id number]    Mfr: [email or web address]    Comments: Demerol 50 / phenergan 50 given    Patient tolerated injection without complications    Given by: Duard Brady LPN (Feb 10, 2010 11:53 AM)  Orders Added: 1)  Prescription Created Electronically [G8553] 2)  Est. Patient Level III [51761] 3)  Venipuncture [60737] 4)  Demerol / Phenergan Injection [J2180] 5)  Admin of Therapeutic Inj  intramuscular or subcutaneous [96372] 6)  TLB-CBC Platelet - w/Differential [85025-CBCD] 7)  TLB-BMP (Basic Metabolic Panel-BMET) [80048-METABOL]

## 2010-10-19 NOTE — Progress Notes (Signed)
Summary: Needs Note for Colonoscopy  Phone Note From Other Clinic Call back at [phone]   Caller: Rene Kocher from [facility] Office  Summary of Call: Needs a note stating if patient can be off plavix a week before 11/10 for scheduled colonoscopy.  Please fax to [phone]  Initial call taken by: Trixie Dredge,  July 07, 2010 9:11 AM  Follow-up for Phone Call        OK Follow-up by: Gordy Savers  MD,  July 07, 2010 12:25 PM  Additional Follow-up for Phone Call Additional follow up Details #1::        letter sent Additional Follow-up by: Trixie Dredge,  July 08, 2010 11:45 AM

## 2010-10-19 NOTE — Procedures (Signed)
Summary: Summary Report  Summary Report   Imported By: Erle Crocker 07/23/2010 10:53:14  _____________________________________________________________________  External Attachment:    Type:   Image     Comment:   External Document

## 2010-10-19 NOTE — Assessment & Plan Note (Signed)
Summary: PAINFUL SWOLLEN LYMPH NODE? (NECK AREA) // RS   Vital Signs:  Patient profile:   65 year old female Weight:      195 pounds Temp:     98.1 degrees F oral BP sitting:   126 / 80  (left arm) Cuff size:   regular  Vitals Entered By: Duard Brady LPN (December 28, 2009 9:18 AM) CC: c/o "knot" to (R) upper clavical area since last monday Is Patient Diabetic? No   CC:  c/o "knot" to (R) upper clavical area since last monday.  History of Present Illness: 65 year old patient who presents with a 7 day history of a painful nodule in theright lateral neck area.  She first noticed this with head turning while she was driver in a vehicle.  She feels this has gotten smaller over the past several days.  No sore throat, fever, or other constitutional complaints.  Her last mammogram was this past summer.  No swallowing difficulty, painful throat.  She has treated hypertension, which has been stable  Preventive Screening-Counseling & Management  Alcohol-Tobacco     Smoking Status: quit  Allergies: 1)  Morphine Sulfate (Morphine Sulfate) 2)  Codeine Phosphate (Codeine Phosphate)  Past History:  Past Medical History: Reviewed history from 02/27/2008 and no changes required. Hypertension Hypothyroidism Low back pain Neck pain Cerebrovascular accident, hx of 06 CVD Hyperlipidemia Colonic polyps, hx of Diverticulosis, colon Headache  Review of Systems  The patient denies anorexia, fever, weight loss, weight gain, vision loss, decreased hearing, hoarseness, chest pain, syncope, dyspnea on exertion, peripheral edema, prolonged cough, headaches, hemoptysis, abdominal pain, melena, hematochezia, severe indigestion/heartburn, hematuria, incontinence, genital sores, muscle weakness, suspicious skin lesions, transient blindness, difficulty walking, depression, unusual weight change, abnormal bleeding, enlarged lymph nodes, angioedema, and breast masses.    Physical Exam  General:   Well-developed,well-nourished,in no acute distress; alert,appropriate and cooperative throughout examination Head:  Normocephalic and atraumatic without obvious abnormalities. No apparent alopecia or balding. Eyes:  No corneal or conjunctival inflammation noted. EOMI. Perrla. Funduscopic exam benign, without hemorrhages, exudates or papilledema. Vision grossly normal. Ears:  External ear exam shows no significant lesions or deformities.  Otoscopic examination reveals clear canals, tympanic membranes are intact bilaterally without bulging, retraction, inflammation or discharge. Hearing is grossly normal bilaterally. Nose:  External nasal examination shows no deformity or inflammation. Nasal mucosa are pink and moist without lesions or exudates. Mouth:  Oral mucosa and oropharynx without lesions or exudates.  Teeth in good repair. Neck:  a small 1 cm round, freely movable tender nodule in the right supraclavicular area Lungs:  Normal respiratory effort, chest expands symmetrically. Lungs are clear to auscultation, no crackles or wheezes.   Impression & Recommendations:  Problem # 1:  CERVICAL LYMPHADENOPATHY, RIGHT (ICD-785.6)  The following medications were removed from the medication list:    Doxycycline Hyclate 100 Mg Caps (Doxycycline hyclate) ..... One twice daily Her updated medication list for this problem includes:    Cephalexin 500 Mg Caps (Cephalexin) ..... One twice daily  Problem # 2:  HYPERTENSION (ICD-401.9)  Her updated medication list for this problem includes:    Verapamil Hcl Cr 360 Mg Cp24 (Verapamil hcl) .Marland Kitchen... Take 1 once daily  Complete Medication List: 1)  Levoxyl 125 Mcg Tabs (Levothyroxine sodium) .... Take 1 tablet by mouth once a day 2)  Plavix 75 Mg Tabs (Clopidogrel bisulfate) .... Take 1 tablet by mouth once a day 3)  Verapamil Hcl Cr 360 Mg Cp24 (Verapamil hcl) .... Take 1  once daily 4)  Simvastatin 40 Mg Tabs (Simvastatin) .Marland Kitchen.. 1 once daily 5)  Fioricet  50-325-40 Mg Tabs (Butalbital-apap-caffeine) .Marland Kitchen.. 1 q6h as needed 6)  Tramadol Hcl 50 Mg Tabs (Tramadol hcl) .Marland Kitchen.. 1 q6h as needed 7)  Meclizine Hcl 25 Mg Tabs (Meclizine hcl) .... One tab every 4-6 hours as needed for dizziness 8)  Diazepam 5 Mg Tabs (Diazepam) .... One tab two times a day 9)  Diclofenac Sodium 50 Mg Tbec (Diclofenac sodium) .... One by mouth bid 10)  Cephalexin 500 Mg Caps (Cephalexin) .... One twice daily  Patient Instructions: 1)  Take your antibiotic as prescribed until ALL of it is gone, but stop if you develop a rash or swelling and contact our office as soon as possible. 2)  Please schedule a follow-up appointment in 3 weeks Prescriptions: CEPHALEXIN 500 MG CAPS (CEPHALEXIN) one twice daily  #20 x 0   Entered and Authorized by:   Gordy Savers  MD   Signed by:   Gordy Savers  MD on 12/28/2009   Method used:   Print then Give to Patient   RxID:   [id number] DOXYCYCLINE HYCLATE 100 MG CAPS (DOXYCYCLINE HYCLATE) one twice daily  #20 x 0   Entered and Authorized by:   Gordy Savers  MD   Signed by:   Gordy Savers  MD on 12/28/2009   Method used:   Print then Give to Patient   RxID:   [id number] DICLOFENAC SODIUM 50 MG TBEC (DICLOFENAC SODIUM) one by mouth bid  #30 Tablet x 3   Entered and Authorized by:   Gordy Savers  MD   Signed by:   Gordy Savers  MD on 12/28/2009   Method used:   Print then Give to Patient   RxID:   [id number]

## 2010-10-19 NOTE — Letter (Signed)
Summary: Cardiac Catheterization Instructions- [facility]  [facility], Main Office  [address]   [place], [place] [address]   Phone: [phone]  Fax: [phone]     05/27/2010 MRN: [id number]  [facility] [address] [place], [place]  [address]  Dear Ms. EARNHART,   You are scheduled for a Cardiac Catheterization on Friday 06/11/10 with Dr. Gala Romney  Please arrive to the 1st floor of the Heart and Vascular Center at [facility] at 7:00 am / pm on the day of your procedure. Please do not arrive before 6:30 a.m. Call the Heart and Vascular Center at [phone] if you are unable to make your appointmnet. The Code to get into the parking garage under the building is 0020. Take the elevators to the 1st floor. You must have someone to drive you home. Someone must be with you for the first 24 hours after you arrive home. Please wear clothes that are easy to get on and off and wear slip-on shoes. Do not eat or drink after midnight except water with your medications that morning. Bring all your medications and current insurance cards with you.  ___ DO NOT take these medications before your procedure: ________________________________________________________________  _X__ Make sure you take your aspirin.  _X__ You may take ALL of your medications with water that morning. ________________________________________________________________________________________________________________________________  ___ DO NOT take ANY medications before your procedure.  ___ Pre-med instructions:  ________________________________________________________________________________________________________________________________  The usual length of stay after your procedure is 2 to 3 hours. This can vary.  If you have any questions, please call the office at the number listed above.   Meredith Staggers, RN

## 2010-10-19 NOTE — Progress Notes (Signed)
Summary: monitor results  Phone Note Outgoing Call   Call placed by: Meredith Staggers, RN,  July 14, 2010 9:34 AM Summary of Call: Dr Gala Romney reviewed monitor, sinus rhythm occ. PACs, have left message for pt to call back   Follow-up for Phone Call        pt aware of results Meredith Staggers, RN  July 14, 2010 11:26 AM

## 2010-10-19 NOTE — Miscellaneous (Signed)
Summary: Appointment Canceled  Appointment status changed to canceled by [employer] on 06/15/2010 12:43 PM.  Cancellation Comments --------------------- echo/ dx: questionable dilated aortic root. gd  Appointment Information ----------------------- Appt Type:  CARDIOLOGY ANCILLARY VISIT      Date:  Wednesday, June 16, 2010      Time:  2:00 PM for 60 min   Urgency:  Routine   Made By:  Hoy Finlay Scheduler  To Visit:  [id number]    Reason:  echo/ dx: questionable dilated aortic root. gd  Appt Comments ------------- -- 06/15/10 12:43: ([facility]) CANCELED -- echo/ dx: questionable dilated aortic root. gd -- 06/11/10 9:38: ([facility]) BOOKED -- Routine CARDIOLOGY ANCILLARY VISIT at 06/16/2010 2:00 PM for 60 min echo/ dx: questionable dilated aortic root. gd

## 2010-10-19 NOTE — Letter (Signed)
Summary: Dr. Micki Riley Office   Dr. Micki Riley Office   Imported By: Marylou Mccoy 06/10/2010 16:45:04  _____________________________________________________________________  External Attachment:    Type:   Image     Comment:   External Document

## 2010-10-19 NOTE — Progress Notes (Signed)
Summary: meclizine refill  Phone Note Call from Patient   Caller: Patient Call For: Gordy Savers  MD Reason for Call: Acute Illness Summary of Call: Pt is asking for refill of Meclizine called to [facility])  Complaining of dizziness.  [phone] Initial call taken by: Lynann Beaver CMA,  October 14, 2009 1:21 PM  Follow-up for Phone Call        generic meclizine 25  #90 use every 6 hrs as needed  RF 5 Follow-up by: Gordy Savers  MD,  October 15, 2009 8:01 AM    Prescriptions: MECLIZINE HCL 25 MG TABS (MECLIZINE HCL) one tab every 4-6 hours as needed for dizziness  #50 x 1   Entered by:   Raechel Ache, RN   Authorized by:   Gordy Savers  MD   Signed by:   Raechel Ache, RN on 10/15/2009   Method used:   [facility] to        [facility] [phone]* (retail)       [address]       [place], [place]  [address]       Ph: [phone] or [phone]       Fax: [phone]   RxID:   [id number]

## 2010-10-19 NOTE — Progress Notes (Signed)
Summary: cardiology referral  Phone Note Call from Patient Call back at Home Phone [phone]   Caller: Patient Call For: Gordy Savers  MD Summary of Call: Pt is asking for a cardiology referral to Dr. Gala Romney Initial call taken by: Lynann Beaver CMA,  May 20, 2010 8:58 AM  Follow-up for Phone Call        ok Follow-up by: Gordy Savers  MD,  May 20, 2010 12:39 PM  New Problems: CHEST PAIN UNSPECIFIED (ICD-786.50)   New Problems: CHEST PAIN UNSPECIFIED (ICD-786.50)

## 2010-10-19 NOTE — Letter (Signed)
[facility]: [facility]   Imported By: Maryln Gottron 10/15/2009 09:15:03  _____________________________________________________________________  External Attachment:    Type:   Image     Comment:   External Document

## 2010-10-19 NOTE — Letter (Signed)
Summary: [facility]   Imported By: Maryln Gottron 06/14/2010 13:04:34  _____________________________________________________________________  External Attachment:    Type:   Image     Comment:   External Document

## 2010-10-19 NOTE — Letter (Signed)
Summary: [facility]  [facility]   Imported By: Maryln Gottron 05/13/2010 15:39:50  _____________________________________________________________________  External Attachment:    Type:   Image     Comment:   External Document

## 2010-10-19 NOTE — Assessment & Plan Note (Signed)
Summary: DIZZINESS // RS  the  Vital Signs:  Patient profile:   65 year old female Weight:      195 pounds Temp:     98.0 degrees F oral BP sitting:   130 / 80  (left arm) Cuff size:   regular  Vitals Entered By: Duard Brady LPN (May 07, 2010 11:24 AM) CC: c/o dizziness on/off x 3 wks Is Patient Diabetic? No   CC:  c/o dizziness on/off x 3 wks.  History of Present Illness: 65 year old patient who is seen today for follow-up.  She has a history of hypertension, dyslipidemia, and cerebrovascular disease.  Three complaints today include some dizziness off and on for the past 3 weeks.  She has had some neck pain, as well as some right shoulder discomfort.  She has seen Dr. Charlett Blake  recently and did receive a cortisone injection.  she has  also been placed on Celebrex twice daily.  Allergies: 1)  ! Demerol 2)  Morphine Sulfate (Morphine Sulfate) 3)  Codeine Phosphate (Codeine Phosphate)  Past History:  Past Medical History: Reviewed history from 02/10/2010 and no changes required. Hypertension Hypothyroidism Low back pain Neck pain Cerebrovascular accident, hx of 06 CVD Hyperlipidemia Colonic polyps, hx of Diverticulosis, colon Headache acute diverticulitis  Review of Systems  The patient denies anorexia, fever, weight loss, weight gain, vision loss, decreased hearing, hoarseness, chest pain, syncope, dyspnea on exertion, peripheral edema, prolonged cough, headaches, hemoptysis, abdominal pain, melena, hematochezia, severe indigestion/heartburn, hematuria, incontinence, genital sores, muscle weakness, suspicious skin lesions, transient blindness, difficulty walking, depression, unusual weight change, abnormal bleeding, enlarged lymph nodes, angioedema, and breast masses.    Physical Exam  General:  Well-developed,well-nourished,in no acute distress; alert,appropriate and cooperative throughout examination Head:  Normocephalic and atraumatic without obvious  abnormalities. No apparent alopecia or balding. Eyes:  No corneal or conjunctival inflammation noted. EOMI. Perrla. Funduscopic exam benign, without hemorrhages, exudates or papilledema. Vision grossly normal. Mouth:  Oral mucosa and oropharynx without lesions or exudates.  Teeth in good repair. Neck:  No deformities, masses, or tenderness noted. Lungs:  Normal respiratory effort, chest expands symmetrically. Lungs are clear to auscultation, no crackles or wheezes. Heart:  Normal rate and regular rhythm. S1 and S2 normal without gallop, murmur, click, rub or other extra sounds. Abdomen:  Bowel sounds positive,abdomen soft and non-tender without masses, organomegaly or hernias noted.   Impression & Recommendations:  Problem # 1:  HYPERTENSION (ICD-401.9)  Her updated medication list for this problem includes:    Verapamil Hcl Cr 360 Mg Cp24 (Verapamil hcl) .Marland Kitchen... Take 1 once daily  Problem # 2:  CEREBROVASCULAR ACCIDENT, HX OF (ICD-V12.50)  Problem # 3:  HYPOTHYROIDISM (ICD-244.9)  Her updated medication list for this problem includes:    Levoxyl 125 Mcg Tabs (Levothyroxine sodium) .Marland Kitchen... Take 1 tablet by mouth once a day  Complete Medication List: 1)  Levoxyl 125 Mcg Tabs (Levothyroxine sodium) .... Take 1 tablet by mouth once a day 2)  Plavix 75 Mg Tabs (Clopidogrel bisulfate) .... Take 1 tablet by mouth once a day 3)  Verapamil Hcl Cr 360 Mg Cp24 (Verapamil hcl) .... Take 1 once daily 4)  Simvastatin 40 Mg Tabs (Simvastatin) .Marland Kitchen.. 1 once daily 5)  Fioricet 50-325-40 Mg Tabs (Butalbital-apap-caffeine) .Marland Kitchen.. 1 q6h as needed 6)  Meclizine Hcl 25 Mg Tabs (Meclizine hcl) .... One tab every 4-6 hours as needed for dizziness 7)  Diclofenac Sodium 50 Mg Tbec (Diclofenac sodium) .... One by mouth bid 8)  Tramadol Hcl  50 Mg Tabs (Tramadol hcl) .... One every 6 hours as needed for pain 9)  Celebrex 200 Mg Caps (Celecoxib) .... Two times a day - given samples by ortho  Patient Instructions: 1)   Please schedule a follow-up appointment in 3 months. 2)  Limit your Sodium (Salt). 3)  It is important that you exercise regularly at least 20 minutes 5 times a week. If you develop chest pain, have severe difficulty breathing, or feel very tired , stop exercising immediately and seek medical attention. Prescriptions: CELEBREX 200 MG CAPS (CELECOXIB) two times a day - given samples by ortho  #180 x 6   Entered and Authorized by:   Gordy Savers  MD   Signed by:   Gordy Savers  MD on 05/07/2010   Method used:   [facility] to        [facility] [phone]* (retail)       [address]       [place], [place]  [address]       Ph: [phone] or [phone]       Fax: [phone]   RxID:   [id number] MECLIZINE HCL 25 MG TABS (MECLIZINE HCL) one tab every 4-6 hours as needed for dizziness  #50 x 1   Entered and Authorized by:   Gordy Savers  MD   Signed by:   Gordy Savers  MD on 05/07/2010   Method used:   [facility] to        [facility] [phone]* (retail)       [address]       [place], [place]  [address]       Ph: [phone] or [phone]       Fax: [phone]   RxID:   [id number] SIMVASTATIN 40 MG  TABS (SIMVASTATIN) 1 once daily  #90 x 6   Entered and Authorized by:   Gordy Savers  MD   Signed by:   Gordy Savers  MD on 05/07/2010   Method used:   Electronically to        [facility] [phone]* (retail)       [address]       [place], [place]  [address]       Ph: [phone] or [phone]       Fax: [phone]   RxID:   [id number] PLAVIX 75 MG TABS (CLOPIDOGREL BISULFATE) Take 1 tablet by mouth once a day  #90 x 5   Entered and Authorized by:   Gordy Savers  MD   Signed by:   Gordy Savers  MD on 05/07/2010   Method used:   Electronically to        [facility] [phone]* (retail)       [address]       [place], [place]  [address]       Ph: [phone] or [phone]       Fax: [phone]   RxID:   [id number] LEVOXYL 125 MCG TABS (LEVOTHYROXINE SODIUM) Take 1 tablet by mouth once a day  #90 Tablet x 4   Entered and Authorized by:   Gordy Savers  MD   Signed by:   Janett Labella  Amador Cunas  MD on 05/07/2010   Method used:   Electronically to        [facility] [phone]* (retail)       [address]       [place], [place]  [address]       Ph: [phone] or [phone]       Fax: [phone]   RxID:   [id number]

## 2010-10-19 NOTE — Letter (Signed)
Summary: Generic Letter  [place] [facility]  [address] [place], [place] [address]   Phone: [phone]  Fax: [phone]    07/08/2010  [facility] [address] [place], [place]  [address]  To Whom It May Concern:  [name] is able to d/c plavix one week prior to colonoscopy scheduled on 07/29/10.       Sincerely,     Eleonore Chiquito, MD

## 2010-10-21 NOTE — Assessment & Plan Note (Signed)
Summary: pain in groin area//ccm   Vital Signs:  Patient profile:   65 year old female Weight:      198 pounds Temp:     98.0 degrees F oral BP sitting:   140 / 74  (right arm) Cuff size:   regular  Vitals Entered By: Duard Brady LPN (September 21, 2010 1:45 PM) CC: (R) inner thigh - check lump , (R) neck - check lump Is Patient Diabetic? No   Primary Care Provider:  Gordy Savers  MD  CC:  (R) inner thigh - check lump  and (R) neck - check lump.  History of Present Illness: 65 year old patient who is seen today with concerns about pain and swelling in the right neck and right groin area.  She noticed pain and swelling approximately  5 days ago that initially started in the right groin area.  She subsequently noticed pain in the right neck area, associated with a palpable lump.  At the present time.  She is having difficult time identifying these nodules.  There is been no fever or other constitutional complaints.  She does treated hypertension, which has been stable.  She has been using Fioricet, and anti-inflammatories with improvement  Allergies: 1)  ! Demerol 2)  Morphine Sulfate (Morphine Sulfate) 3)  Codeine Phosphate (Codeine Phosphate)  Past History:  Past Medical History: Reviewed history from 05/27/2010 and no changes required. 1. Hypertension 2. Hypothyroidism 3. Low back pain 4. Neck pain 5. Cerebrovascular accident, hx of 06 6. CVD 7. Hyperlipidemia 8. Colonic polyps, hx of 9. Diverticulosis, colon 10. Headache 11. Acute diverticulitis  Past Surgical History: Reviewed history from 05/27/2010 and no changes required. colonoscopy in October 2008 heart catheterization 12 2006 Abdominal Hysterectomy-Total Thyroidectomy Elbow ligament repair  Review of Systems  The patient denies anorexia, fever, weight loss, weight gain, vision loss, decreased hearing, hoarseness, chest pain, syncope, dyspnea on exertion, peripheral edema, prolonged cough,  headaches, hemoptysis, abdominal pain, melena, hematochezia, severe indigestion/heartburn, hematuria, incontinence, genital sores, muscle weakness, suspicious skin lesions, transient blindness, difficulty walking, depression, unusual weight change, abnormal bleeding, enlarged lymph nodes, angioedema, and breast masses.    Physical Exam  General:  Well-developed,well-nourished,in no acute distress; alert,appropriate and cooperative throughout examination Head:  Normocephalic and atraumatic without obvious abnormalities. No apparent alopecia or balding. Eyes:  No corneal or conjunctival inflammation noted. EOMI. Perrla. Funduscopic exam benign, without hemorrhages, exudates or papilledema. Vision grossly normal. Mouth:  Oral mucosa and oropharynx without lesions or exudates.  Teeth in good repair. Neck:  no adenopathy noted Abdomen:  Bowel sounds positive,abdomen soft and non-tender without masses, organomegaly or hernias noted. no adenopathy in the groin region identified   Impression & Recommendations:  Problem # 1:  GROIN PAIN (ICD-789.09)  Her updated medication list for this problem includes:    Fioricet 50-325-40 Mg Tabs (Butalbital-apap-caffeine) .Marland Kitchen... 1 q6h as needed    Diclofenac Sodium 50 Mg Tbec (Diclofenac sodium) ..... One by mouth two times a day when not on celebrex    Celebrex 200 Mg Caps (Celecoxib) .Marland Kitchen..Marland Kitchen Two times a day - given samples by ortho  Problem # 2:  NECK PAIN, ACUTE (ICD-723.1)  Her updated medication list for this problem includes:    Fioricet 50-325-40 Mg Tabs (Butalbital-apap-caffeine) .Marland Kitchen... 1 q6h as needed    Diclofenac Sodium 50 Mg Tbec (Diclofenac sodium) ..... One by mouth two times a day when not on celebrex    Celebrex 200 Mg Caps (Celecoxib) .Marland Kitchen..Marland Kitchen Two times a  day - given samples by ortho  Complete Medication List: 1)  Levoxyl 125 Mcg Tabs (Levothyroxine sodium) .... Take 1 tablet by mouth once a day 2)  Plavix 75 Mg Tabs (Clopidogrel bisulfate) ....  Take 1 tablet by mouth once a day 3)  Verapamil Hcl Cr 360 Mg Cp24 (Verapamil hcl) .... Take 1 once daily 4)  Simvastatin 40 Mg Tabs (Simvastatin) .Marland Kitchen.. 1 once daily 5)  Fioricet 50-325-40 Mg Tabs (Butalbital-apap-caffeine) .Marland Kitchen.. 1 q6h as needed 6)  Meclizine Hcl 25 Mg Tabs (Meclizine hcl) .... One tab every 4-6 hours as needed for dizziness 7)  Diclofenac Sodium 50 Mg Tbec (Diclofenac sodium) .... One by mouth two times a day when not on celebrex 8)  Celebrex 200 Mg Caps (Celecoxib) .... Two times a day - given samples by ortho 9)  Vitamin D (ergocalciferol) 50000 Unit Caps (Ergocalciferol) .... Biwkly  Patient Instructions: 1)  Please schedule a follow-up appointment in 4 months. 2)  Limit your Sodium (Salt). 3)  call if  any symptoms worsened   Orders Added: 1)  Est. Patient Level III [[address]]

## 2010-10-21 NOTE — Progress Notes (Signed)
Summary: requestin increase of levoxyl  Phone Note Call from Patient Call back at Home Phone [phone] Call back at Work Phone [phone]   Caller: Patient---triage vm Summary of Call: her gyn dr is Dr Teodora Medici with [facility]   [phone].   A fax was sent to Dr Kirtland Bouchard about her labwork, which  was done and was told that she needed an increase of her Levoxyl.         [facility]     ph----[phone] Initial call taken by: Warnell Forester,  August 30, 2010 10:27 AM  Follow-up for Phone Call        spke with Shanda Bumps at Dr. Chevis Pretty - will refax labs   KIK Follow-up by: Duard Brady LPN,  August 30, 2010 12:32 PM  Additional Follow-up for Phone Call Additional follow up Details #1::        patient called back. wants Selena Batten to return call. wants thyroid meds. Additional Follow-up by: Warnell Forester,  September 02, 2010 8:54 AM    Additional Follow-up for Phone Call Additional follow up Details #2::    spoke with pt - TSH - 3.603 per Dr. Amador Cunas - ok - no adjustment to med needed -  [facility] - she has refill aviabl. for her already. KIK Follow-up by: Duard Brady LPN,  September 06, 2010 11:06 AM

## 2010-10-21 NOTE — Procedures (Signed)
Summary: Colonoscopy Report/Dr. Sharrell Ku  Colonoscopy Report/Dr. Sharrell Ku   Imported By: Maryln Gottron 09/02/2010 09:45:13  _____________________________________________________________________  External Attachment:    Type:   Image     Comment:   External Document

## 2010-11-04 ENCOUNTER — Other Ambulatory Visit: Payer: Self-pay | Admitting: Internal Medicine

## 2010-11-29 ENCOUNTER — Other Ambulatory Visit: Payer: Self-pay | Admitting: Internal Medicine

## 2011-01-07 ENCOUNTER — Other Ambulatory Visit: Payer: Self-pay | Admitting: Internal Medicine

## 2011-01-07 NOTE — Telephone Encounter (Signed)
Last filled 11/04/10 with #180 - please advise Last seen 09/2010

## 2011-01-10 NOTE — Telephone Encounter (Signed)
Dr. Amador Cunas approved and sent rx. KIK

## 2011-01-17 ENCOUNTER — Ambulatory Visit (INDEPENDENT_AMBULATORY_CARE_PROVIDER_SITE_OTHER): Payer: Medicare Other | Admitting: Internal Medicine

## 2011-01-17 ENCOUNTER — Encounter: Payer: Self-pay | Admitting: Internal Medicine

## 2011-01-17 VITALS — BP 104/80 | Temp 98.7°F | Wt 193.0 lb

## 2011-01-17 DIAGNOSIS — K5732 Diverticulitis of large intestine without perforation or abscess without bleeding: Secondary | ICD-10-CM

## 2011-01-17 DIAGNOSIS — I10 Essential (primary) hypertension: Secondary | ICD-10-CM

## 2011-01-17 DIAGNOSIS — E785 Hyperlipidemia, unspecified: Secondary | ICD-10-CM

## 2011-01-17 NOTE — Progress Notes (Signed)
  Subjective:    Patient ID: Abigail [name], female    DOB: 1946-04-26, 65 y.o.   MRN: [id number]  HPI 65 year old patient who was hospitalized recently at [facility]. She was admitted for CT scan confirmed acute diverticulitis. She was discharged yesterday on antibiotic therapy which she continues to take and tolerate well she still has abdominal pain that has modestly improved she has advanced her diet to mechanical soft. No nausea or vomiting. She's had no further rectal bleeding. Hospital records reviewed. No fever or chills. She does have treated hypertension and dyslipidemia. She also has a history of known diverticulosis and colonic polyps.    Review of Systems  Constitutional: Positive for fatigue.  HENT: Negative for hearing loss, congestion, sore throat, rhinorrhea, dental problem, sinus pressure and tinnitus.   Eyes: Negative for pain, discharge and visual disturbance.  Respiratory: Negative for cough and shortness of breath.   Cardiovascular: Negative for chest pain, palpitations and leg swelling.  Gastrointestinal: Positive for abdominal pain and blood in stool. Negative for nausea, vomiting, diarrhea, constipation and abdominal distention.  Genitourinary: Negative for dysuria, urgency, frequency, hematuria, flank pain, vaginal bleeding, vaginal discharge, difficulty urinating, vaginal pain and pelvic pain.  Musculoskeletal: Negative for joint swelling, arthralgias and gait problem.  Skin: Negative for rash.  Neurological: Negative for dizziness, syncope, speech difficulty, weakness, numbness and headaches.  Hematological: Negative for adenopathy.  Psychiatric/Behavioral: Negative for behavioral problems, dysphoric mood and agitation. The patient is not nervous/anxious.        Objective:   Physical Exam  Constitutional: She is oriented to person, place, and time. She appears well-developed and well-nourished.  HENT:  Head: Normocephalic.  Right Ear: External  ear normal.  Left Ear: External ear normal.  Mouth/Throat: Oropharynx is clear and moist.  Eyes: Conjunctivae and EOM are normal. Pupils are equal, round, and reactive to light.  Neck: Normal range of motion. Neck supple. No thyromegaly present.  Cardiovascular: Normal rate, regular rhythm, normal heart sounds and intact distal pulses.   Pulmonary/Chest: Effort normal and breath sounds normal.  Abdominal: Soft. Bowel sounds are normal. She exhibits no distension and no mass. There is tenderness. There is rebound. There is no guarding.       Abdomen was soft and nondistended no guarding noted she did have mild rebound tenderness on the left  Musculoskeletal: Normal range of motion.  Lymphadenopathy:    She has no cervical adenopathy.  Neurological: She is alert and oriented to person, place, and time.  Skin: Skin is warm and dry. No rash noted.  Psychiatric: She has a normal mood and affect. Her behavior is normal.          Assessment & Plan:   Acute diverticulitis Rectal bleeding secondary to diverticular disease Hypertension stable History of electrolyte abnormalities  We'll continue antibiotic therapy. We'll check a CBC and electrolytes. She'll call if there is any clinical worsening.

## 2011-01-17 NOTE — Patient Instructions (Signed)
Drink clear liquids only for the next 24 hours,  then slowly add other liquids and food as you  tolerate them  Take your antibiotic as prescribed until ALL of it is gone, but stop if you develop a rash, swelling, or any side effects of the medication.  Contact our office as soon as possible if  there are side effects of the medication.  Call or return to clinic prn if these symptoms worsen or fail to improve as anticipated.  

## 2011-01-18 LAB — CBC WITH DIFFERENTIAL/PLATELET
Basophils Relative: 0.3 % (ref 0.0–3.0)
Eosinophils Relative: 0.9 % (ref 0.0–5.0)
HCT: 39.7 % (ref 36.0–46.0)
Lymphs Abs: 2.5 10*3/uL (ref 0.7–4.0)
MCV: 91.3 fl (ref 78.0–100.0)
Monocytes Absolute: 0.6 10*3/uL (ref 0.1–1.0)
RBC: 4.34 Mil/uL (ref 3.87–5.11)
WBC: 7.9 10*3/uL (ref 4.5–10.5)

## 2011-01-18 LAB — BASIC METABOLIC PANEL
BUN: 14 mg/dL (ref 6–23)
Calcium: 9.6 mg/dL (ref 8.4–10.5)
GFR: 81.68 mL/min (ref >60.00)
Glucose, Bld: 99 mg/dL (ref 70–99)

## 2011-02-01 NOTE — Consult Note (Signed)
NAMESHAVANA, CALDER                   ACCOUNT NO.:  [id number]   MEDICAL RECORD NO.:  [id number]          PATIENT TYPE:  EMS   LOCATION:  MAJO                         FACILITY:  [facility]   PHYSICIAN:  Pramod P. Pearlean Brownie, MD    DATE OF BIRTH:  Feb 15, 1946   DATE OF CONSULTATION:  DATE OF DISCHARGE:  02/16/2008                                 CONSULTATION   REFERRING PHYSICIAN:  Lear Ng, MD.   REASON FOR REFERRAL:  Dizziness and headache.   HISTORY OF PRESENT ILLNESS:  Ms. Eichel is a 65 year old African American  lady who has started having new onset of headache since 2 weeks.  She  describes headache has been daily, severe, throbbing like hemicranial.  She has been taking Fioricet up to 6 tablets a day, which takes the edge  off, headache settles for couple of hours, but then comes back as the  effect wears off.  She has also been complaining of dizziness and  sensation of off-balance.  She feels she did not fall and she denies  vertigo, loss of vision, diplopia, focal extremity weakness or numbness.  She has past neurological history of recent episode of tilting to the  left and dizziness for which she saw me in the office last month and  thought she had mild peripheral vestibular dysfunction as it is  responsible for symptoms.  She was evaluated at that time for posterior  circulation TIA, which on MRI scan showed remote lacunar infarcts in the  right corpus callosum and left anterior thalamus, all of which were of  remote dates, but apparently some progression of white matter changes  compared with previous MRI of June 08, 2005.  Differential  diagnosis otherwise white matter changes was thought to be demyelinating  disease versus small vessel disease, vasculitis, or CADASIL.  The  patient had MRI of the brain and neck done on November 28, 2007, both of  which showed no significant stenosis.  MRI of the C-spine also showed  only minor degenerative change at C5-C6, but no frank  compression was  noted.   PAST MEDICAL HISTORY:  Significant for:  1. Hypertension.  2. Hyperlipidemia.  3. Chronic headaches.  4. Degenerative lumbar and cervical spine disease.  5. Depression.  6. Hypothyroidism.   HOME MEDICATIONS:  Plavix, verapamil, Levoxyl, Fioricet, Zocor,  tramadol, Darvocet, and Amitraz.   MEDICATION ALLERGIES:  CODEINE.   REVIEW OF SYSTEMS:  Positive for headache, dizziness, and gait  difficulties.   PHYSICAL EXAMINATION:  GENERAL:  Reveals a pleasant middle-aged African  American lady at present not in distress.  VITAL SIGNS:  She is afebrile.  Pulse rate 72 per minute and regular,  respirations 16 per minute, and distal pulses well heard.  HEAD:  Nontraumatic.  NECK:  Supple.  There is no neck stiffness.  NEUROLOGICAL:  Face is symmetric, bilateral movements are normal.  Tongue is midline.  MOTOR SYSTEM:  Reveals no upper extremity drift.  Symmetric strength on  reflexes, coordination, and sensation.  The gait was not tested today.   DATA REVIEWED:  No  previous office notes.  An MRI and MRA findings as  stated above and are reviewed.  She has also patent foramen ovale as per  transcranial Doppler bubble study.  Her TE did not show a large enough  PFO to be endovascularly closed, even though she had gone for the  procedure to have it closed.  She also had a CT angiogram of the chest  done recently by me to evaluate for pulmonary AVM, which was also  negative.  MRI scan of the brain has been done today, which I have  looked at shows no acute infarct, shows white matter nonspecific changes  as stated above, which were probably unchanged.   IMPRESSION:  A 65 year old lady with worsening headache and dizziness  for last 2 weeks.  She does have a prior history of chronic headaches  with present headaches seem more severe, perhaps she has a component of  analgesic-rebound headache due to taking too much Fioricet.  I have  advised her to stop the  Fioricet and instead take Ultram 50-100 mg as  needed for headache up to 4 times a day.  Give her IV Depacon 1.5 g,  loading dose now, subsequently followed by Depakote ER 500 mg a day for  headache prophylaxis.  We will check labs for vasculitic panel, Lyme  disease, and sarcoidosis.  She will follow up with me in the office in  few weeks, call early as needed.  May need to consider doing electric  spinal tap in the office if her headache persists.           ______________________________  Sunny Schlein. Pearlean Brownie, MD     PPS/MEDQ  D:  02/15/2008  T:  02/16/2008  Job:  [id number]

## 2011-02-04 NOTE — H&P (Signed)
Abigail [name], Abigail [name]                   ACCOUNT NO.:  [id number]   MEDICAL RECORD NO.:  [id number]          PATIENT TYPE:  INP   LOCATION:  1823                         FACILITY:  [facility]   PHYSICIAN:  Michael L. Reynolds, M.D.DATE OF BIRTH:  Apr 06, 1946   DATE OF ADMISSION:  04/27/2005  DATE OF DISCHARGE:                                HISTORY & PHYSICAL   CHIEF COMPLAINT:  Right-sided weakness and aphasia.   HISTORY OF PRESENT ILLNESS:  This is the initial stroke service for this 65-  year-old woman with a past medical history which includes hypertension.  The  patient's son reports that this morning she was sitting at her computer  acting like her usual normal self.  He went to take a shower at  approximately 10 to 10:15 a.m.  He came back about 10:30 a.m. and found her  with great difficulty speaking and unable to get up from the chair.  EMS was  alerted.  The patient was brought to the [facility]  emergency Room where code stroke protocols were initiated.  The patient had  slight improvement in the emergency room, but still had significant  deficits, including leftward gaze, right hemiparesis and aphasia, especially  non-fluent.  The patient was given intravenous TPA with a bolus starting at  12:40 p.m. and was taken to the angiography suite for urgent intervention.  The patient has no history of previous symptoms or any stroke events per the  family.   PAST MEDICAL HISTORY:  1.  Remarkable for hypertension.  2.  Hypothyroidism.  3.  Chronic neck and low back pain, for which she has seen neurosurgeon, Dr.      Danae Orleans. Venetia Maxon.  She states she had an MRI on her neck yesterday.   FAMILY HISTORY:  Her mother had a stroke in her 70's.   SOCIAL HISTORY:  No tobacco use.  She was independent in her activities of  daily living.   ALLERGIES:  CODEINE causes nausea and vomiting.   MEDICATIONS:  1.  Verapamil.  2.  Levoxyl.  No known vascular or other blood  thinners.   REVIEW OF SYSTEMS:  Unobtainable from the patient.  No recent symptoms or  events per the family members.   PHYSICAL EXAMINATION:  VITAL SIGNS:  Temperature 97.5 degrees, blood  pressure 140/83, pulse 56, respirations 22.  GENERAL:  This is a healthy-appearing woman supine on the hospital bed, in  no evident distress.  HEENT:  Head normocephalic and atraumatic.  Oropharynx benign.  NECK:  Supple without carotid bruits.  CHEST:  Clear to auscultation bilaterally.  HEART:  Bradycardic, regular rhythm, no murmur.  ABDOMEN:  Soft, normoactive bowel sounds.  EXTREMITIES:  With 2+ pulses.  NEUROLOGIC:  Mental status:  She appears drowsy.  She alerts to voice.  She  is unable to answer any orientation questions.  She follows one-step  commands inconsistently.  She is able to answer most yes or no questions,  but is unable to express any speech except for some occasional garbled  words.  Cranial nerves:  Pupils  3 mm, briskly reactive to 2.  Gaze is  deviated to the left.  She does not blink to threat from the right.  There  is a mild right facial droop.  Tongue and palate move in the midline.  Motor:  She is able to occasionally move the right upper and lower  extremities with less than  antigravity strength spontaneously.  She  demonstrates full strength on the left.  Sensation:  She withdraws with 3/5  strength to pain in the right lower extremity and 2/5 strength to pain in  the right upper extremity.  Sensation on the left appears to be normal.  Toes are up on the right and down on the left.   LABORATORY DATA:  CBC:  White count 4.5, hemoglobin 13.8, platelets 227,000.  CMET is unremarkable.  Coags were unremarkable.   A CT of the head is reviewed and demonstrates no acute abnormality.   IMPRESSION:  Acute left brain stroke with right hemiparesis and aphasia.  Suspect a carotid or middle cerebral artery occlusion.   PLAN:  The patient is receiving intravenous TPA in the  angio suite for a  cerebral angiography and possible intra-arterial TPA.  Following that she  will be moved to the intensive care unit for routine post-TPA stroke care.  Stroke service to follow.      Michael L. Thad Ranger, M.D.  Electronically Signed     MLR/MEDQ  D:  04/27/2005  T:  04/27/2005  Job:  [id number]   cc:   Gordy Savers, M.D. [facility]  [address] [place]  [place] [address]   Danae Orleans. Venetia Maxon, M.D.  [address]  [place]  [place] [address]  Fax: [phone]

## 2011-02-04 NOTE — Discharge Summary (Signed)
NAMECAMRY, [name]                   ACCOUNT NO.:  [id number]   MEDICAL RECORD NO.:  [id number]          PATIENT TYPE:  INP   LOCATION:  3033                         FACILITY:  [facility]   PHYSICIAN:  Deanna Artis. Hickling, M.D.DATE OF BIRTH:  [birth year]   DATE OF ADMISSION:  04/27/2005  DATE OF DISCHARGE:  05/01/2005                                 DISCHARGE SUMMARY   FINAL DIAGNOSES:  1.  Bi-cerebral embolic non-hemorrhagic infarctions (433.31) without source.  2.  Mild dyslipidemia with elevated LDH.  3.  Hypertension, in good control (404.10).  4.  Obesity.  5.  Chronic headache disorder (784.0).  6.  Low-grade fever without clear cut source.   PROCEDURE:  1.  MRI of the brain.  2.  MRA intracranial.  3.  2-D echocardiogram.  4.  Carotid Doppler.  5.  Esophageal echocardiogram.   COMPLICATIONS:  None.   HISTORY OF PRESENT ILLNESS:  Abigail [name] is a 65 year old right handed married  woman with a history of hypertension. The patient had onset of right sided  weakness and difficulty speaking and inability to get up from her chair on  the morning of April 27, 2005. She was brought to the emergency room within  a 3 hour time window and received 2/3 dose of IV TPA and was taken to the  angiogram suite. Angiogram was negative. The patient's symptoms completely  cleared. It is presumed that the occlusion lysed with IV TPA, perhaps spread  further distally, where it could not be seen.   MRI scan of the brain showed multiple bi-hemispheric, sub-centimeter, non-  hemorrhagic infarctions that was thought to be from an embolic etiology.   The patient had the 2-D echocardiogram, which failed to show source of  emboli and showed normal ejection fraction of 55% to 65%. No regional wall  abnormalities. Left ventricular wall was mildly increased. The study was  technically limited because of a poor acoustic window.   Transesophageal echocardiogram was carried out and showed a minuet patent  foramen ovale that showed minimal bubbles spontaneously from right to left  and only a few more when the patient had a Valsalva maneuver. No clot was  seen. All chambers were normal. There was minimal mitral and tricuspid  regurgitation, normal aortic valve, and normal pulmonary valve. The aortic  root and ascending aorta showed atheromatous changes and luminal  irregularities. It was felt that the patent foramen ovale was not the cause  of the patient's embolic events.   The patient was placed on Aggrenox and did not tolerate it. Very shortly  after taking it, she developed a very severe headache with vomiting, which  persisted until the next day. This extended her hospital stay by 1 day. We  took her off of Aggrenox, placed her on aspirin, treated her with Toradol  and Phenergan and her symptoms have subsided.   PHYSICAL EXAMINATION:  GENERAL:  This morning, her examination blood  pressure 120/69, resting pulse 66, respiratory rate 18, temperature  minimally elevated at 99.3. No sources for infection. Oxygen saturation 98%  on room air.  LUNGS:  Clear.  HEART:  No murmurs.  ABDOMEN:  Soft. Bowel sounds normal.  EXTREMITIES:  Normal.  SKIN:  No lesions.  NEUROLOGIC:  Awake, alert, without dysphagia or dyspraxia. Cranial nerves  round and reactive. Motor examination, normal strength, tone, and mass. Good  fine motor movements. No pronator drift. Sensation intact to primary and  cortical modalities. Cerebellar examination, good finger to nose, repetitive  movements. Gait was normal. Reflexes were diminished. The patient has  bilateral flexor plantar responses.  HEENT:  Pupils, visual fields full. Symmetric facial strength. Midline  tongue.   LABORATORY DATA:  Other laboratory studies:  Serum homocystine 10.32.  Hemoglobin A1C 5.8. Total cholesterol 195, LDL cholesterol 120, HDL  cholesterol 61, VLDL cholesterol 14, triglycerides 68. We will start Lipitor  10 mg in an attempt to  decrease the LDL and I have also given the patient  counseling concerning a low-fat diet. I have also talked to the patient  about myalgias and the liver function tests while she is on this Statin  drug. We will switch her Aggrenox to aspirin.   Other laboratory studies include:  Factor 5 Leiden mutation, lupus  anticoagulant, protein S, protein C - total and functional, which are  pending at this time. White blood cell count 4,500. Hemoglobin 13.8 and  hematocrit 40.5. MCV 84.8. Platelet count 227,000. PT 12.6, INR 0.9, PTT 31.  Anti-thrombin 3 is also pending. Sodium 138, potassium 4.0, chloride 107,  CO2 23, glucose 92, BUN 9, creatinine 0.7, calcium 9.6, total protein 7.7,  albumin 3.9, AST 18, ALT 16, ALP 79, total bilirubin 1.0. Anti-cardiolipin  antibody panel pending. Beta 2 glucoprotein IGG, IGM, IGA pending.  Prothrombin gene mutation G to A 20210 pending.   DISPOSITION:  The patient is discharged in improved condition at this time.   DISCHARGE MEDICATIONS:  1.  Verapamil 240 mg SR 1 daily.  2.  Synthroid 125 mcg daily.  3.  Aspirin 325 mg daily (enteric coated).  4.  Lipitor 10 mg daily.   FOLLOW UP:  1.  She will return to see Dr. Pearlean Brownie in 6 to 8 weeks time.  2.  She should see Dr. Amador Cunas within a month to check on blood      pressure. I would recommend repeat cholesterol testing in about 3 months      time but a liver panel in 1 months time, when she sees Dr. Amador Cunas.   DIET:  She is to consume a low-fat, low-salt diet.   SPECIAL INSTRUCTIONS:  She is to report any new focal neurologic deficits to  Dr. Pearlean Brownie at [facility] and/or Dr. Amador Cunas.      Deanna Artis. Sharene Skeans, M.D.  Electronically Signed     WHH/MEDQ  D:  05/01/2005  T:  05/01/2005  Job:  [id number]   cc:   Gordy Savers, M.D. [facility]  [address] [place]  [place] [address]   Pramod P. Pearlean Brownie, MD  Fax: [phone]

## 2011-02-04 NOTE — Cardiovascular Report (Signed)
NAMEKYNZEE, DEVINNEY                   ACCOUNT NO.:  [id number]   MEDICAL RECORD NO.:  [id number]          PATIENT TYPE:  OIB   LOCATION:  2853                         FACILITY:  [facility]   PHYSICIAN:  Cristy Hilts. Jacinto Halim, MD       DATE OF BIRTH:  [birth year]   DATE OF PROCEDURE:  08/26/2005  DATE OF DISCHARGE:  08/26/2005                              CARDIAC CATHETERIZATION   PROCEDURE PERFORMED:  Intracardiac echocardiogram.   CARDIOLOGIST:  Cristy Hilts. Jacinto Halim, M.D.   INDICATIONS:  Ms. Joette Schmoker is a 65 year old female with a history of  hypertension and hyperlipidemia who had a cerebrovascular accident showing  multiple bihemispheric hemorrhagic stroke.  It was felt to be embolic.  She  underwent a transesophageal echocardiogram yesterday, which showed a small-  to-moderate size PFO with spontaneous shunting from right-to-left.  Given  this the patient was brought to the catheterization lab for possible PFO  closure.  It was felt that the PFO was probable culprit  for a stroke.   RESULTS:   INTRACARDIAC ECHOCARDIOGRAPHIC DATA:  Left Atrium:  The left atrium was  normal.   Left Ventricle:  The left ventricle was normal.   Right Atrium:  The right atrium was normal.   Right Ventricle:  The right ventricle was normal.   Tricuspid Valve:  The tricuspid valve was normal.  There is mild tricuspid  valvular regurgitation.   Mitral Valve:  The mitral valve was normal.  There was minimal mitral  valvular regurgitation.   Aortic Valve:  The aortic valve was normal.   Pulmonary Valve:  The pulmonary valve was normal.   Aortic Root:  The aortic root was normal without any complex plaque.   Intra-atrial Septum:  The intra-atrial septum was intact.  An injection of  double contrast study was positive for right-to-left shunting; however, the  bubble from the left atrial side appeared late in the cardiac cycle  suggesting an AV malformation than a true PFO.  After confirming this with  multiple  injections and after having probed the intra-atrial septum PFO  could not be confirmed.   IMPRESSION:  1.  Weakly positive double contrast study suggesting right-to-left shunting.      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  The bubbles in the left atrial side appeared      late in the cardiac cycle.  2.  No PFO by intracardiac echocardiographic examination.  In spite of      probing the intra-atrial septum I was unable to cross into the left      atrial side.   RECOMMENDATIONS:  1.  Based on the intracardiac echocardiographic findings continued medical      therapy is advised.  2.  The patient will be discharged home this evening if she remains stable.   TECHNIQUE OF THE PROCEDURE:  Under the topical  anesthesia and mild sedation  using a 9 French left femoral venous access and a 6 French right femoral  venous access the intracardiac echo probe was advanced through the  left  femoral vein and intracardiac echocardiogram was performed.  The data was  carefully analyzed.  Then a 6 [place] multipurpose A2 catheter was advanced  through the right femoral vein and the entry to the septum was attempted to  be probed.  In spite of multiple attempts I was unable to cross into the  left atrial side. Hence, after confirming that there was no significant PFO  the procedure was abandoned.   The patient tolerated the procedure.   COMPLICATIONS:  There were no immediate complications.   MEDICATIONS:  During the procedure intravenous heparin was administered and  the ACT was maintained at greater than 260.      Cristy Hilts. Jacinto Halim, MD  Electronically Signed     JRG/MEDQ  D:  08/26/2005  T:  08/27/2005  Job:  [id number]   cc:   Gordy Savers, M.D. [facility]  [address] [place]  [place] [address]   Pramod P. Pearlean Brownie, MD  Fax: [phone]

## 2011-03-04 ENCOUNTER — Other Ambulatory Visit: Payer: Self-pay | Admitting: Internal Medicine

## 2011-03-04 NOTE — Telephone Encounter (Signed)
Please advise last written 12/2010 #180  0RF , last seen 12/2010

## 2011-03-04 NOTE — Telephone Encounter (Signed)
#  50  RF 2 

## 2011-03-04 NOTE — Telephone Encounter (Signed)
Sent refill

## 2011-03-15 ENCOUNTER — Other Ambulatory Visit: Payer: Self-pay | Admitting: Gynecology

## 2011-03-15 DIAGNOSIS — Z1231 Encounter for screening mammogram for malignant neoplasm of breast: Secondary | ICD-10-CM

## 2011-04-07 ENCOUNTER — Ambulatory Visit (INDEPENDENT_AMBULATORY_CARE_PROVIDER_SITE_OTHER): Payer: Medicare Other | Admitting: Internal Medicine

## 2011-04-07 ENCOUNTER — Encounter: Payer: Self-pay | Admitting: Internal Medicine

## 2011-04-07 DIAGNOSIS — I10 Essential (primary) hypertension: Secondary | ICD-10-CM

## 2011-04-07 DIAGNOSIS — K5732 Diverticulitis of large intestine without perforation or abscess without bleeding: Secondary | ICD-10-CM

## 2011-04-07 MED ORDER — METRONIDAZOLE 500 MG PO TABS: 500.0000 mg | ORAL_TABLET | Freq: Three times a day (TID) | ORAL | Status: DC

## 2011-04-07 MED ORDER — TRAMADOL HCL 50 MG PO TABS: 50.0000 mg | ORAL_TABLET | Freq: Four times a day (QID) | ORAL | Status: AC | PRN

## 2011-04-07 MED ORDER — CIPROFLOXACIN HCL 500 MG PO TABS: 500.0000 mg | ORAL_TABLET | Freq: Two times a day (BID) | ORAL | Status: DC

## 2011-04-07 NOTE — Progress Notes (Signed)
  Subjective:    Patient ID: Abigail [name], female    DOB: 1946-06-28, 65 y.o.   MRN: [id number]  HPI  65 year old patient who has a history of recurrent diverticular disease. She was treated and hospitalized briefly in April of this year. For the past few days she has had increasing pain in the lower abdominal quadrants. There's been no fever. She has had some mild diarrhea earlier that has resolved. For the past 2 days she has been taking Flagyl. Her chief complaint is persistent ongoing pain. She states her symptoms are identical to prior episodes of acute diverticulitis    Review of Systems  Constitutional: Negative.   HENT: Negative for hearing loss, congestion, sore throat, rhinorrhea, dental problem, sinus pressure and tinnitus.   Eyes: Negative for pain, discharge and visual disturbance.  Respiratory: Negative for cough and shortness of breath.   Cardiovascular: Negative for chest pain, palpitations and leg swelling.  Gastrointestinal: Positive for nausea, abdominal pain and diarrhea. Negative for vomiting, constipation, blood in stool and abdominal distention.  Genitourinary: Negative for dysuria, urgency, frequency, hematuria, flank pain, vaginal bleeding, vaginal discharge, difficulty urinating, vaginal pain and pelvic pain.  Musculoskeletal: Negative for joint swelling, arthralgias and gait problem.  Skin: Negative for rash.  Neurological: Negative for dizziness, syncope, speech difficulty, weakness, numbness and headaches.  Hematological: Negative for adenopathy.  Psychiatric/Behavioral: Negative for behavioral problems, dysphoric mood and agitation. The patient is not nervous/anxious.        Objective:   Physical Exam  Constitutional: She appears well-developed and well-nourished. No distress.  Abdominal: Soft. Bowel sounds are normal. She exhibits no distension. There is tenderness. There is rebound. There is no guarding.       Tenderness was noted in the lower quadrants left  greater than right or midline bowel sounds are active No guarding appreciated but she had mild rebound tenderness          Assessment & Plan:   Recurrent acute diverticulitis  The patient placed on bowel rest oral antibiotic therapy. She was told if she develops worsening pain or fever to report to the ED for evaluation

## 2011-04-07 NOTE — Patient Instructions (Addendum)
Advance diet slowly  Take your antibiotic as prescribed until ALL of it is gone, but stop if you develop a rash, swelling, or any side effects of the medication.  Contact our office as soon as possible if  there are side effects of the medication.  Call if you develop  fever worsening pain or report to the emergency department for evaluationDiverticulitis Small pockets or "bubbles" can develop in the wall of the intestine. These are called diverticuli. If they become infected and inflamed, the problem is called diverticulitis. This causes belly pain (usually on the left side). There may also be:    Fever.   Bloating.   Gas.   Feeling sick to your stomach (nausea).   Loss of hunger.  Some people with this problem have watery poop (diarrhea). Others feel that they cannot poop (constipated).   HOME CARE  Only take medicine as told by your doctor. If you have been given medicines (antibiotics) that kill germs, finish all the medicine. Do this even if you feel better.   For 24 to 48 hours, drink only clear liquids, such as:   Water.   Fruit juices with no pulp.   Clear broth.   Frozen ice pops.   Drink enough water and fluids to keep your pee clear or pale yellow.   Eat more fiber (whole grains, fruits, and vegetables). Eat a little more each day as you feel better.   Go to the bathroom when you have the urge to go.   Avoid using medicine that makes you go poop (laxatives).  GET HELP RIGHT AWAY IF YOU:  Are not getting better after taking medicine (antibiotics) for 2 days.   Notice bright red blood in your poop (stool).   Develop very bad belly (abdominal) pain.   You have a temperature by mouth above 100, not controlled by medicine.   Cannot poop.   Keep throwing up (vomiting).  MAKE SURE YOU:    Understand these instructions.   Will watch your condition.   Will get help right away if you are not doing well or get worse.  Document Released: 02/22/2008 Document  Re-Released: 11/30/2009 [facility] Patient Information 2011 [place], [place].Diverticulitis Small pockets or "bubbles" can develop in the wall of the intestine. These are called diverticuli. If they become infected and inflamed, the problem is called diverticulitis. This causes belly pain (usually on the left side). There may also be:    Fever.   Bloating.   Gas.   Feeling sick to your stomach (nausea).   Loss of hunger.  Some people with this problem have watery poop (diarrhea). Others feel that they cannot poop (constipated).   HOME CARE  Only take medicine as told by your doctor. If you have been given medicines (antibiotics) that kill germs, finish all the medicine. Do this even if you feel better.   For 24 to 48 hours, drink only clear liquids, such as:   Water.   Fruit juices with no pulp.   Clear broth.   Frozen ice pops.   Drink enough water and fluids to keep your pee clear or pale yellow.   Eat more fiber (whole grains, fruits, and vegetables). Eat a little more each day as you feel better.   Go to the bathroom when you have the urge to go.   Avoid using medicine that makes you go poop (laxatives).  GET HELP RIGHT AWAY IF YOU:  Are not getting better after taking medicine (antibiotics) for 2 days.  Notice bright red blood in your poop (stool).   Develop very bad belly (abdominal) pain.   You have a temperature by mouth above 100, not controlled by medicine.   Cannot poop.   Keep throwing up (vomiting).  MAKE SURE YOU:    Understand these instructions.   Will watch your condition.   Will get help right away if you are not doing well or get worse.  Document Released: 02/22/2008 Document Re-Released: 11/30/2009 [facility] Patient Information 2011 [place], [place].Diverticulitis Small pockets or "bubbles" can develop in the wall of the intestine. These are called diverticuli. If they become infected and inflamed, the problem is called diverticulitis. This  causes belly pain (usually on the left side). There may also be:    Fever.   Bloating.   Gas.   Feeling sick to your stomach (nausea).   Loss of hunger.  Some people with this problem have watery poop (diarrhea). Others feel that they cannot poop (constipated).   HOME CARE  Only take medicine as told by your doctor. If you have been given medicines (antibiotics) that kill germs, finish all the medicine. Do this even if you feel better.   For 24 to 48 hours, drink only clear liquids, such as:   Water.   Fruit juices with no pulp.   Clear broth.   Frozen ice pops.   Drink enough water and fluids to keep your pee clear or pale yellow.   Eat more fiber (whole grains, fruits, and vegetables). Eat a little more each day as you feel better.   Go to the bathroom when you have the urge to go.   Avoid using medicine that makes you go poop (laxatives).  GET HELP RIGHT AWAY IF YOU:  Are not getting better after taking medicine (antibiotics) for 2 days.   Notice bright red blood in your poop (stool).   Develop very bad belly (abdominal) pain.   You have a temperature by mouth above 100, not controlled by medicine.   Cannot poop.   Keep throwing up (vomiting).  MAKE SURE YOU:    Understand these instructions.   Will watch your condition.   Will get help right away if you are not doing well or get worse.  Document Released: 02/22/2008 Document Re-Released: 11/30/2009 [facility] Patient Information 2011 [place], [place].

## 2011-04-18 ENCOUNTER — Ambulatory Visit
Admission: RE | Admit: 2011-04-18 | Discharge: 2011-04-18 | Disposition: A | Discharge status: Home / Self Care | Payer: Medicare Other | Source: Ambulatory Visit | Attending: Gynecology | Admitting: Gynecology

## 2011-04-18 DIAGNOSIS — Z1231 Encounter for screening mammogram for malignant neoplasm of breast: Secondary | ICD-10-CM

## 2011-04-19 ENCOUNTER — Telehealth: Payer: Self-pay

## 2011-04-19 NOTE — Telephone Encounter (Signed)
Need aug 15 office notes faxed to dr. Glenna Fellows at [phone] , office ph [phone] Being evaled for surgery r/t diveticulisits

## 2011-04-20 ENCOUNTER — Ambulatory Visit (INDEPENDENT_AMBULATORY_CARE_PROVIDER_SITE_OTHER): Payer: Medicare Other | Admitting: Surgery

## 2011-04-25 ENCOUNTER — Telehealth (INDEPENDENT_AMBULATORY_CARE_PROVIDER_SITE_OTHER): Payer: Self-pay | Admitting: General Surgery

## 2011-04-25 NOTE — Telephone Encounter (Signed)
Has the med record for this office visit been sent ?

## 2011-04-25 NOTE — Telephone Encounter (Signed)
Abigail [name] with Dr Kinnie Scales called to see if we could move patient sooner with Dr Johna Sheriff. They did not want to switch MD's. I advised his office was full. If he has any new patient cancellations if we could move her sooner from 05/04/11 that would be good. Patient having diverticulitis attack. Advised if patient doesn't get better may need to go to ER. Thanks.

## 2011-05-02 NOTE — Telephone Encounter (Signed)
Faxed notes to Dr.Hoxworth for pt.

## 2011-05-02 NOTE — Telephone Encounter (Signed)
noted 

## 2011-05-04 ENCOUNTER — Ambulatory Visit (INDEPENDENT_AMBULATORY_CARE_PROVIDER_SITE_OTHER): Payer: Medicare Other | Admitting: General Surgery

## 2011-05-04 ENCOUNTER — Encounter (INDEPENDENT_AMBULATORY_CARE_PROVIDER_SITE_OTHER): Payer: Self-pay | Admitting: General Surgery

## 2011-05-04 VITALS — BP 132/84 | HR 72 | Temp 97.8°F | Resp 16 | Ht 65.5 in | Wt 191.6 lb

## 2011-05-04 DIAGNOSIS — K5732 Diverticulitis of large intestine without perforation or abscess without bleeding: Secondary | ICD-10-CM | POA: Insufficient documentation

## 2011-05-04 NOTE — Patient Instructions (Signed)
Will discuss results of X-ray and GYN referral by phone then schedule surgery

## 2011-05-04 NOTE — Progress Notes (Signed)
Subjective:   Recurrent diverticulitis and abdominal pain  Patient ID: Abigail [name], female   DOB: [birth year], 65 y.o.   MRN: [id number]  HPI The patient is a 65 year old female here for the courtesy of Dr. Kinnie Scales for recurrent abdominal pain and diverticulitis. This has been a problem for her for the last 2-3 years. She estimates that she has had 7 or 8 episodes requiring treatment over that period of time. Her last episode was about one month ago and she is now completing a course of treatment. She did require one hospitalization when she was on vacation at the beach and she developed pain and bleeding. Otherwise this has been treated as an outpatient. Between episodes she will feel reasonably well. She does have some chronic constipation, bloating and small stools. No recent bleeding. Review of her imaging shows that she had a CT scan of the abdomen and pelvis in April of last year showing an uncomplicated sigmoid diverticulitis. The patient is here to discuss possible surgical treatment. Review of Systems  Constitutional: Positive for fatigue. Negative for fever and unexpected weight change.  HENT: Negative.   Eyes: Negative.   Respiratory: Positive for shortness of breath. Negative for chest tightness, wheezing and stridor.   Cardiovascular: Negative.   Gastrointestinal: Positive for abdominal pain, constipation and abdominal distention. Negative for nausea, blood in stool and anal bleeding.  Genitourinary: Negative.   Musculoskeletal: Positive for back pain and arthralgias.  Neurological: Positive for dizziness and light-headedness. Negative for syncope and speech difficulty.  Hematological: Negative.        Objective:   Physical Exam General: Moderately overweight African American female in no acute distress Skin: Warm and dry without rash or infection HEENT: Sclera nonicteric. Pupils equal and reactive. Well-healed thyroidectomy scar. No masses. Lungs: Clear without wheezing or increased  work of breathing Cardiovascular: Regular rate and rhythm. No murmurs. No JVD or edema. Abdomen: Mildly obese. No hernias. Soft with mild to moderate left lower quadrant and suprapubic tenderness. No masses or organomegaly appreciated. Extremities: No joint swelling or deformity. Neurologic: Alert and fully oriented. Gait normal.    Assessment:     65 year old female with multiple episodes of recurrent sigmoid diverticulitis. He has some tenderness on exam today despite being between episodes. I think she is at high risk for continued problems and for complications. For this reason I believe she would be best served by elective sigmoid colectomy.    Plan:     We discussed in detail today the pros and cons of proceeding with elective colectomy to control her recurrent attacks and prevent future complications. I believe she would be a good candidate for laparoscopic procedure and we discussed this. The indications for the procedure, risks of anesthetic complications, bleeding, infection, anastomotic leakage, and possible need for temporary ostomy were discussed and understood.  I would like to get a preoperative barium enema to assess the extent of her disease and discussed this with her.  The patient is followed by Dr. Thamas Jaegers for ovarian cysts and there is apparently some consideration of performing bilateral salpingo-oophorectomy. The patient will obtain a recommendation regarding gynecologic surgery I will discuss with that physician the possibility of a combined procedure.  We will be in touch by phone with the results of the barium enema and the above referral.

## 2011-05-05 ENCOUNTER — Other Ambulatory Visit (INDEPENDENT_AMBULATORY_CARE_PROVIDER_SITE_OTHER): Payer: Self-pay

## 2011-05-09 ENCOUNTER — Other Ambulatory Visit (INDEPENDENT_AMBULATORY_CARE_PROVIDER_SITE_OTHER): Payer: Self-pay

## 2011-05-09 DIAGNOSIS — K5730 Diverticulosis of large intestine without perforation or abscess without bleeding: Secondary | ICD-10-CM

## 2011-05-13 ENCOUNTER — Other Ambulatory Visit: Payer: Self-pay | Admitting: Internal Medicine

## 2011-05-13 ENCOUNTER — Telehealth (INDEPENDENT_AMBULATORY_CARE_PROVIDER_SITE_OTHER): Payer: Self-pay

## 2011-05-13 NOTE — Telephone Encounter (Signed)
Pt left voice mail message c/o abdominal cramping, vomiting x1 & diarrhea, she's scheduled for upcoming Barium Enema and wanted to know if she need's to reschedule her test.  I spoke with Dr. Johna Sheriff and states patient's BE can be rescheduled if she has not improved by then but, he also states that patient need's to be followed by her PCP for symptoms she may be having.  I was unable to reach patient and left message to call our office to discuss this further.

## 2011-05-16 ENCOUNTER — Ambulatory Visit
Admission: RE | Admit: 2011-05-16 | Discharge: 2011-05-16 | Disposition: A | Discharge status: Home / Self Care | Payer: Medicare Other | Source: Ambulatory Visit | Attending: General Surgery | Admitting: General Surgery

## 2011-05-16 DIAGNOSIS — K5730 Diverticulosis of large intestine without perforation or abscess without bleeding: Secondary | ICD-10-CM

## 2011-05-18 ENCOUNTER — Telehealth (INDEPENDENT_AMBULATORY_CARE_PROVIDER_SITE_OTHER): Payer: Self-pay | Admitting: General Surgery

## 2011-05-18 NOTE — Telephone Encounter (Signed)
I discussed with the patient the results of her barium enema showing extensive diverticular disease in the sigmoid colon and scattered diverticula elsewhere. She is interested in proceeding with colectomy. She is to see Dr. Harold Hedge for consultation in about one week. I asked her to have him call me following this to talk about a possible combined procedure.

## 2011-05-18 NOTE — Telephone Encounter (Signed)
Message copied by Mariella Saa on Wed May 18, 2011 12:42 PM ------      Message from: Glenna Fellows T      Created: Tue May 17, 2011  7:15 AM       Call re BE results

## 2011-05-19 ENCOUNTER — Encounter: Payer: Self-pay | Admitting: Internal Medicine

## 2011-05-19 ENCOUNTER — Ambulatory Visit (INDEPENDENT_AMBULATORY_CARE_PROVIDER_SITE_OTHER): Payer: Medicare Other | Admitting: Internal Medicine

## 2011-05-19 DIAGNOSIS — K5730 Diverticulosis of large intestine without perforation or abscess without bleeding: Secondary | ICD-10-CM

## 2011-05-19 DIAGNOSIS — K5732 Diverticulitis of large intestine without perforation or abscess without bleeding: Secondary | ICD-10-CM

## 2011-05-19 LAB — CBC WITH DIFFERENTIAL/PLATELET
Basophils Relative: 0.3 % (ref 0.0–3.0)
Eosinophils Relative: 0.3 % (ref 0.0–5.0)
Lymphocytes Relative: 17.4 % (ref 12.0–46.0)
Neutrophils Relative %: 75 % (ref 43.0–77.0)
RBC: 4.47 Mil/uL (ref 3.87–5.11)
WBC: 10.6 10*3/uL — ABNORMAL HIGH (ref 4.5–10.5)

## 2011-05-19 MED ORDER — METRONIDAZOLE 500 MG PO TABS: 500.0000 mg | ORAL_TABLET | Freq: Three times a day (TID) | ORAL | Status: DC

## 2011-05-19 MED ORDER — CIPROFLOXACIN HCL 500 MG PO TABS: 500.0000 mg | ORAL_TABLET | Freq: Two times a day (BID) | ORAL | Status: DC

## 2011-05-19 MED ORDER — BUTALBITAL-APAP-CAFFEINE 50-325-40 MG PO TABS: 1.0000 | ORAL_TABLET | Freq: Four times a day (QID) | ORAL | Status: DC | PRN

## 2011-05-19 NOTE — Patient Instructions (Signed)
Report to the hospital immediately if you develop fever or worsening abdominal pain.  Take your antibiotic as prescribed until ALL of it is gone, but stop if you develop a rash, swelling, or any side effects of the medication.  Contact our office as soon as possible if  there are side effects of the medication.  General surgical and gynecologic followup as planned

## 2011-05-19 NOTE — Progress Notes (Signed)
  Subjective:    Patient ID: Abigail [name], female    DOB: 1946-04-27, 65 y.o.   MRN: [id number]  HPI  65 year old patient who has a history of complex recurrent diverticular disease. She has been evaluated by general surgery who recently and is scheduled for elective resection probably next month. She is scheduled for a gynecologic evaluation on September 10 for consideration of a combined  Oophorectomy.  She had recurrent pain involving the left mid abdominal area 3 days ago. She has chills but no documented fever. Pain is quite uncomfortable and she did not sleep last night. She has been taking Fioricet only for pain and unfortunately is intolerant of narcotic analgesics;  she has not been using Ultram.    Review of Systems  Constitutional: Positive for chills, activity change and appetite change. Negative for fever.  HENT: Negative for hearing loss, congestion, sore throat, rhinorrhea, dental problem, sinus pressure and tinnitus.   Eyes: Negative for pain, discharge and visual disturbance.  Respiratory: Negative for cough and shortness of breath.   Cardiovascular: Negative for chest pain, palpitations and leg swelling.  Gastrointestinal: Positive for abdominal pain. Negative for nausea, vomiting, diarrhea, constipation, blood in stool and abdominal distention.  Genitourinary: Negative for dysuria, urgency, frequency, hematuria, flank pain, vaginal bleeding, vaginal discharge, difficulty urinating, vaginal pain and pelvic pain.  Musculoskeletal: Negative for joint swelling, arthralgias and gait problem.  Skin: Negative for rash.  Neurological: Negative for dizziness, syncope, speech difficulty, weakness, numbness and headaches.  Hematological: Negative for adenopathy.  Psychiatric/Behavioral: Negative for behavioral problems, dysphoric mood and agitation. The patient is not nervous/anxious.        Objective:   Physical Exam  Constitutional: She is oriented to person, place, and time. She  appears well-developed and well-nourished.  HENT:  Head: Normocephalic.  Right Ear: External ear normal.  Left Ear: External ear normal.  Mouth/Throat: Oropharynx is clear and moist.  Eyes: Conjunctivae and EOM are normal. Pupils are equal, round, and reactive to light.  Neck: Normal range of motion. Neck supple. No thyromegaly present.  Cardiovascular: Normal rate, regular rhythm, normal heart sounds and intact distal pulses.   Pulmonary/Chest: Effort normal and breath sounds normal.  Abdominal: Soft. Bowel sounds are normal. She exhibits no distension and no mass. There is tenderness. There is rebound and guarding.  Musculoskeletal: Normal range of motion.  Lymphadenopathy:    She has no cervical adenopathy.  Neurological: She is alert and oriented to person, place, and time.  Skin: Skin is warm and dry. No rash noted.  Psychiatric: She has a normal mood and affect. Her behavior is normal.          Assessment & Plan:   Recurrent diverticulitis. We'll check a CBC today. The patient feels unwell and does not desire inpatient treatment at this time. This was encouraged and offered;  we'll start on antibiotic therapy today and check a white count. She will report to the ED if pain worsens she develops fever for CT abdominal scan and hospitalization for IV medications. She will continue Fioricet and also take tramadol every 6 hours

## 2011-05-24 ENCOUNTER — Other Ambulatory Visit: Payer: Self-pay | Admitting: Internal Medicine

## 2011-05-24 ENCOUNTER — Telehealth (INDEPENDENT_AMBULATORY_CARE_PROVIDER_SITE_OTHER): Payer: Self-pay | Admitting: General Surgery

## 2011-05-25 ENCOUNTER — Other Ambulatory Visit: Payer: Self-pay | Admitting: Internal Medicine

## 2011-05-31 ENCOUNTER — Telehealth (INDEPENDENT_AMBULATORY_CARE_PROVIDER_SITE_OTHER): Payer: Self-pay | Admitting: General Surgery

## 2011-06-09 ENCOUNTER — Encounter: Payer: Self-pay | Admitting: Internal Medicine

## 2011-06-09 ENCOUNTER — Ambulatory Visit (INDEPENDENT_AMBULATORY_CARE_PROVIDER_SITE_OTHER): Payer: Medicare Other | Admitting: Internal Medicine

## 2011-06-09 DIAGNOSIS — R109 Unspecified abdominal pain: Secondary | ICD-10-CM

## 2011-06-09 DIAGNOSIS — I10 Essential (primary) hypertension: Secondary | ICD-10-CM

## 2011-06-09 NOTE — Progress Notes (Signed)
  Subjective:    Patient ID: Abigail [name], female    DOB: 10-Nov-1945, 65 y.o.   MRN: [id number]  HPI  65 year old patient has a history of treated hypertension and complex diverticular disease. For the past 4 days she has had some discomfort in the left flank area. She was seen earlier and treated for a diverticular flare. These symptoms have resolved. She is scheduled soon for elective surgery due to recurrent complex diverticular disease. Denies any GU symptoms. No fever or chills. No recent falls or trauma. She is using tramadol for pain with definite    Review of Systems  Constitutional: Negative.   HENT: Negative for hearing loss, congestion, sore throat, rhinorrhea, dental problem, sinus pressure and tinnitus.   Eyes: Negative for pain, discharge and visual disturbance.  Respiratory: Negative for cough and shortness of breath.   Cardiovascular: Negative for chest pain, palpitations and leg swelling.  Gastrointestinal: Positive for abdominal pain. Negative for nausea, vomiting, diarrhea, constipation, blood in stool and abdominal distention.  Genitourinary: Negative for dysuria, urgency, frequency, hematuria, flank pain, vaginal bleeding, vaginal discharge, difficulty urinating, vaginal pain and pelvic pain.  Musculoskeletal: Positive for back pain. Negative for joint swelling, arthralgias and gait problem.  Skin: Negative for rash.  Neurological: Negative for dizziness, syncope, speech difficulty, weakness, numbness and headaches.  Hematological: Negative for adenopathy.  Psychiatric/Behavioral: Negative for behavioral problems, dysphoric mood and agitation. The patient is not nervous/anxious.        Objective:   Physical Exam  Constitutional: She appears well-developed and well-nourished. No distress.       Blood pressure 130/76  Pulmonary/Chest: Effort normal and breath sounds normal.  Abdominal: Soft.       No CVA tenderness  Musculoskeletal: Normal range of motion. She exhibits  no tenderness.       No significant back tenderness          Assessment & Plan:    Nonspecific left flank pain. We'll clinically observe at this time and treat symptomatically. She has had a recent CT abdominal scan and is scheduled for a elective surgical repair of diverticular disease. She will call if she develops worsening symptoms  Hypertension stable. We'll continue present regimen

## 2011-06-09 NOTE — Patient Instructions (Signed)
Call or return to clinic prn if these symptoms worsen or fail to improve as anticipated.

## 2011-06-15 LAB — COMPREHENSIVE METABOLIC PANEL
ALT: 36 — ABNORMAL HIGH
AST: 23
Alkaline Phosphatase: 106
CO2: 25
GFR calc Af Amer: >60
Glucose, Bld: 84
Potassium: 3.9
Sodium: 141
Total Protein: 7.9

## 2011-06-15 LAB — DIFFERENTIAL
Basophils Relative: 1
Eosinophils Absolute: 0.3
Eosinophils Relative: 5
Monocytes Relative: 10
Neutrophils Relative %: 51

## 2011-06-15 LAB — CK TOTAL AND CKMB (NOT AT ARMC): CK, MB: 1

## 2011-06-15 LAB — SJOGRENS SYNDROME-A EXTRACTABLE NUCLEAR ANTIBODY: SSA (Ro) (ENA) Antibody, IgG: <0.2 AI (ref <1.0)

## 2011-06-15 LAB — B. BURGDORFI ANTIBODIES: B burgdorferi Ab IgG+IgM: 0.35

## 2011-06-15 LAB — PROTIME-INR: INR: 0.9

## 2011-06-15 LAB — ANA: Anti Nuclear Antibody(ANA): NEGATIVE

## 2011-06-15 LAB — SEDIMENTATION RATE: Sed Rate: 25 — ABNORMAL HIGH

## 2011-06-15 LAB — SJOGRENS SYNDROME-B EXTRACTABLE NUCLEAR ANTIBODY: SSB (La) (ENA) Antibody, IgG: 0.6 AI (ref <1.0)

## 2011-06-15 LAB — CBC
Hemoglobin: 13.3
MCHC: 34.4
MCV: 89
RBC: 4.34
WBC: 5.1

## 2011-06-15 LAB — ANGIOTENSIN CONVERTING ENZYME: Angiotensin-Converting Enzyme: 32 U/L (ref 9–67)

## 2011-06-23 ENCOUNTER — Telehealth (INDEPENDENT_AMBULATORY_CARE_PROVIDER_SITE_OTHER): Payer: Self-pay | Admitting: General Surgery

## 2011-06-23 NOTE — Telephone Encounter (Signed)
I called the patient with a barium enema report. She says she is ready to proceed with surgery. She states she saw Dr. [name] and that he has apparently recommended proceeding with oophorectomy at the time of her colectomy. I told her I would need to discuss that with him personally and then we could get her surgery scheduled.

## 2011-07-13 ENCOUNTER — Other Ambulatory Visit: Payer: Self-pay | Admitting: Internal Medicine

## 2011-07-13 MED ORDER — CIPROFLOXACIN HCL 500 MG PO TABS: 500.0000 mg | ORAL_TABLET | Freq: Two times a day (BID) | ORAL | Status: DC

## 2011-07-13 MED ORDER — METRONIDAZOLE 500 MG PO TABS: 500.0000 mg | ORAL_TABLET | Freq: Three times a day (TID) | ORAL | Status: DC

## 2011-07-13 NOTE — Telephone Encounter (Signed)
Order sent to [facility] Pt aware. kik

## 2011-07-13 NOTE — Telephone Encounter (Signed)
Pt having symptoms of diverticulitis again and is req refill of metroNIDAZOLE (FLAGYL) 500 MG tablet and ciprofloxacin (CIPRO) 500 MG tablet to [facility] in [place]. Pt doesn't feel like coming in for ov.

## 2011-07-13 NOTE — Telephone Encounter (Signed)
OK 

## 2011-07-13 NOTE — Telephone Encounter (Signed)
Please advise 

## 2011-07-29 ENCOUNTER — Encounter (INDEPENDENT_AMBULATORY_CARE_PROVIDER_SITE_OTHER): Payer: Self-pay | Admitting: General Surgery

## 2011-08-06 ENCOUNTER — Other Ambulatory Visit: Payer: Self-pay | Admitting: Internal Medicine

## 2011-08-08 ENCOUNTER — Encounter: Payer: Self-pay | Admitting: Internal Medicine

## 2011-08-08 ENCOUNTER — Ambulatory Visit (INDEPENDENT_AMBULATORY_CARE_PROVIDER_SITE_OTHER): Payer: Medicare Other | Admitting: Internal Medicine

## 2011-08-08 VITALS — BP 120/80 | Temp 98.4°F | Wt 204.0 lb

## 2011-08-08 DIAGNOSIS — Z0000 Encounter for general adult medical examination without abnormal findings: Secondary | ICD-10-CM

## 2011-08-08 DIAGNOSIS — E039 Hypothyroidism, unspecified: Secondary | ICD-10-CM

## 2011-08-08 DIAGNOSIS — K5730 Diverticulosis of large intestine without perforation or abscess without bleeding: Secondary | ICD-10-CM

## 2011-08-08 DIAGNOSIS — Z23 Encounter for immunization: Secondary | ICD-10-CM

## 2011-08-08 DIAGNOSIS — I10 Essential (primary) hypertension: Secondary | ICD-10-CM

## 2011-08-08 NOTE — Patient Instructions (Signed)
Discontinue diclofenac  Nexium one daily    It is important that you exercise regularly, at least 20 minutes 3 to 4 times per week.  If you develop chest pain or shortness of breath seek  medical attention.  Limit your sodium (Salt) intake  Return in 3 months for follow-up

## 2011-08-08 NOTE — Progress Notes (Signed)
  Subjective:    Patient ID: Abigail [name], female    DOB: [birth year], 65 y.o.   MRN: [id number]  HPI  65 year old patient who has a history of complex diverticular disease. She is scheduled for elective partial left colectomy next month. She takes diclofenac for arthritic pain. For the past several days she has had left upper quadrant discomfort. This has been associated with belching bloating. Other complaints including weight gain. No nausea or vomiting. Pain is clearly different from her diverticular pain. She is also scheduled for bilateral oophorectomy at the time of her partial colectomy    Review of Systems  Constitutional: Negative.   HENT: Negative for hearing loss, congestion, sore throat, rhinorrhea, dental problem, sinus pressure and tinnitus.   Eyes: Negative for pain, discharge and visual disturbance.  Respiratory: Negative for cough and shortness of breath.   Cardiovascular: Negative for chest pain, palpitations and leg swelling.  Gastrointestinal: Positive for abdominal pain. Negative for nausea, vomiting, diarrhea, constipation, blood in stool and abdominal distention.  Genitourinary: Negative for dysuria, urgency, frequency, hematuria, flank pain, vaginal bleeding, vaginal discharge, difficulty urinating, vaginal pain and pelvic pain.  Musculoskeletal: Negative for joint swelling, arthralgias and gait problem.  Skin: Negative for rash.  Neurological: Negative for dizziness, syncope, speech difficulty, weakness, numbness and headaches.  Hematological: Negative for adenopathy.  Psychiatric/Behavioral: Negative for behavioral problems, dysphoric mood and agitation. The patient is not nervous/anxious.        Objective:   Physical Exam  Constitutional: She is oriented to person, place, and time. She appears well-developed and well-nourished.  HENT:  Head: Normocephalic.  Right Ear: External ear normal.  Left Ear: External ear normal.  Mouth/Throat: Oropharynx is clear and moist.   Eyes: Conjunctivae and EOM are normal. Pupils are equal, round, and reactive to light.  Neck: Normal range of motion. Neck supple. No thyromegaly present.  Cardiovascular: Normal rate, regular rhythm, normal heart sounds and intact distal pulses.   Pulmonary/Chest: Effort normal and breath sounds normal.  Abdominal: Soft. Bowel sounds are normal. She exhibits no distension and no mass. There is no tenderness. There is no rebound and no guarding.  Musculoskeletal: Normal range of motion.  Lymphadenopathy:    She has no cervical adenopathy.  Neurological: She is alert and oriented to person, place, and time.  Skin: Skin is warm and dry. No rash noted.  Psychiatric: She has a normal mood and affect. Her behavior is normal.          Assessment & Plan:   Left upper quadrant pain. Possible gastritis related to anti-inflammatory drug use. This be discontinued and she'll be empirically placed on Nexium. Hypertension well controlled Complex diverticular disease. Scheduled for elective partial colectomy.  Hypothyroidism. Due to weight gain and a TSH will be reviewed

## 2011-08-10 ENCOUNTER — Other Ambulatory Visit: Payer: Self-pay | Admitting: Internal Medicine

## 2011-08-10 NOTE — Telephone Encounter (Signed)
Pharmacy called and is req to get pts butalbital-acetaminophen-caffeine (FIORICET, ESGIC) 50-325-40 MG per tablet refilled. Pt is completely out of med. Pharmacy said that they had told pt that they would send in refill req for pt, but pharmacy did not send it, so now pt has run out of med. Pls call in today if possible. Pharmacy is aware that pcp is out of office today and that office will be closed tomorrow.

## 2011-08-25 ENCOUNTER — Telehealth: Payer: Self-pay | Admitting: Internal Medicine

## 2011-08-25 NOTE — Telephone Encounter (Signed)
Pt Signed ROI,All Records were Faxed to Dr.Hoxworth @ [phone] 08/25/11/km

## 2011-08-26 ENCOUNTER — Encounter (INDEPENDENT_AMBULATORY_CARE_PROVIDER_SITE_OTHER): Payer: Self-pay | Admitting: General Surgery

## 2011-08-26 ENCOUNTER — Ambulatory Visit (INDEPENDENT_AMBULATORY_CARE_PROVIDER_SITE_OTHER): Payer: Medicare Other | Admitting: General Surgery

## 2011-08-26 VITALS — BP 140/86 | HR 60 | Temp 97.6°F | Resp 16 | Ht 65.5 in | Wt 204.2 lb

## 2011-08-26 DIAGNOSIS — K5732 Diverticulitis of large intestine without perforation or abscess without bleeding: Secondary | ICD-10-CM

## 2011-08-26 NOTE — Progress Notes (Signed)
Subjective:     Patient ID: Abigail [name], female   DOB: 01-31-1946, 65 y.o.   MRN: [id number]  HPI Patient returns to the office for a preop visit prior to planned laparoscopic sigmoid colectomy for recurrent episodes of sigmoid diverticulitis. Please see previous notes for details of this illness. We did get a barium enema since her last visit which showed extensive diverticulosis concentrated in the sigmoid colon with just a scattered tics elsewhere. Also she has salpingo-oophorectomy planned during the same anesthesia. She has not had any recent severe attacks but continues to have intermittent symptoms. We discussed the procedure in detail today and all her questions were answered. Past Medical History  Diagnosis Date  . Hypothyroidism   . Hyperlipidemia   . Hypertension   . History of colonic polyps   . Cervical lymphadenopathy     Right side  . Late effect of adverse effect of drug, medicinal or biological substance   . History of tension headache   . Heart palpitations   . Diverticulitis   . Osteoarthritis   . Stroke 2006, 2009  . Degenerative joint disease of cervical spine   . Patent foramen ovale     Small   No past surgical history on file. Current Outpatient Prescriptions  Medication Sig Dispense Refill  . butalbital-acetaminophen-caffeine (FIORICET, ESGIC) 50-325-40 MG per tablet TAKE 1 TABLET BY MOUTH EVERY 6 HOURS AS NEEDED FOR HEADACHE.  50 tablet  2  . ciprofloxacin (CIPRO) 500 MG tablet Take 1 tablet (500 mg total) by mouth 2 (two) times daily.  20 tablet  0  . clopidogrel (PLAVIX) 75 MG tablet TAKE 1 TABLET BY MOUTH ONCE A DAY  90 tablet  4  . Esomeprazole Magnesium (NEXIUM PO) Take by mouth.        . levothyroxine (SYNTHROID, LEVOTHROID) 125 MCG tablet TAKE 1 TABLET BY MOUTH ONCE A DAY  90 tablet  3  . meclizine (ANTIVERT) 25 MG tablet Take 25 mg by mouth 3 (three) times daily as needed.        . metroNIDAZOLE (FLAGYL) 500 MG tablet Take 1 tablet (500 mg total) by mouth  3 (three) times daily.  30 tablet  0  . PLAVIX 75 MG tablet Take 75 mg by mouth daily.       . simvastatin (ZOCOR) 40 MG tablet TAKE 1 TABLET BY MOUTH EVERY DAY  90 tablet  5  . traMADol (ULTRAM) 50 MG tablet Take 50 mg by mouth every 6 (six) hours as needed.       . diclofenac (VOLTAREN) 50 MG EC tablet Take 50 mg by mouth as needed.        . verapamil (VERELAN PM) 360 MG 24 hr capsule TAKE ONE CAPSULE ONCE DAILY  90 capsule  2   Allergies  Allergen Reactions  . Aggrenox (Aspirin-Dipyridamole)   . Codeine Phosphate     REACTION: unspecified  . Dilaudid (Hydromorphone Hcl)   . Hydrocodone   . Meperidine Hcl   . Percocet (Oxycodone-Acetaminophen)    History  Substance Use Topics  . Smoking status: Former Smoker    Quit date: 09/19/1968  . Smokeless tobacco: Never Used  . Alcohol Use: Yes    Review of Systems  Constitutional: Negative.   Respiratory: Negative.   Cardiovascular: Negative.   Gastrointestinal: Positive for abdominal pain.       Objective:   Physical Exam    General: Moderately overweight African American female in no acute distress  Skin: Warm and  dry without rash or infection  HEENT: Sclera nonicteric. Pupils equal and reactive. Well-healed thyroidectomy scar. No masses.  Lungs: Clear without wheezing or increased work of breathing  Cardiovascular: Regular rate and rhythm. No murmurs. No JVD or edema.  Abdomen: Mildly obese. No hernias. Soft with mild to moderate left lower quadrant and suprapubic tenderness. No masses or organomegaly appreciated.  Extremities: No joint swelling or deformity.  Neurologic: Alert and fully oriented. Gait normal  Assessment:     Recurrent diverticulitis of the sigmoid colon    Plan:     Patient will be admitted for laparoscopic and possible open sigmoid colectomy. I again discussed with the patient and her husband the nature of the surgery, its indications, and risks of possible open procedure, anesthetic complications,  cardiopulmonary complications, bleeding, infection, anastomotic leak, and possible need for diverting temporary ostomy. All their questions were answered.

## 2011-08-30 ENCOUNTER — Other Ambulatory Visit: Payer: Self-pay | Admitting: Obstetrics and Gynecology

## 2011-08-30 ENCOUNTER — Telehealth: Payer: Self-pay | Admitting: Internal Medicine

## 2011-08-30 NOTE — Telephone Encounter (Signed)
Cath,Lov,Echo,12 faxed to [facility]  @ [phone] 08/30/11/km

## 2011-08-31 ENCOUNTER — Telehealth (INDEPENDENT_AMBULATORY_CARE_PROVIDER_SITE_OTHER): Payer: Self-pay | Admitting: General Surgery

## 2011-08-31 NOTE — Telephone Encounter (Signed)
[facility] called and stated that the pt needs to be on Antibodies before surgery. Pt is schedule for surgery on 09-07-11

## 2011-09-01 ENCOUNTER — Encounter (HOSPITAL_COMMUNITY): Payer: Self-pay | Admitting: Pharmacy Technician

## 2011-09-05 ENCOUNTER — Other Ambulatory Visit: Payer: Self-pay

## 2011-09-05 ENCOUNTER — Encounter (HOSPITAL_COMMUNITY)
Admission: RE | Admit: 2011-09-05 | Discharge: 2011-09-05 | Disposition: A | Discharge status: Home / Self Care | Payer: Medicare Other | Source: Ambulatory Visit | Attending: General Surgery | Admitting: General Surgery

## 2011-09-05 ENCOUNTER — Ambulatory Visit (HOSPITAL_COMMUNITY)
Admission: RE | Admit: 2011-09-05 | Discharge: 2011-09-05 | Disposition: A | Discharge status: Home / Self Care | Payer: Medicare Other | Source: Ambulatory Visit | Attending: General Surgery | Admitting: General Surgery

## 2011-09-05 ENCOUNTER — Encounter (HOSPITAL_COMMUNITY): Payer: Self-pay

## 2011-09-05 DIAGNOSIS — E039 Hypothyroidism, unspecified: Secondary | ICD-10-CM

## 2011-09-05 DIAGNOSIS — G4730 Sleep apnea, unspecified: Secondary | ICD-10-CM

## 2011-09-05 DIAGNOSIS — M47812 Spondylosis without myelopathy or radiculopathy, cervical region: Secondary | ICD-10-CM

## 2011-09-05 DIAGNOSIS — I639 Cerebral infarction, unspecified: Secondary | ICD-10-CM

## 2011-09-05 DIAGNOSIS — Z01810 Encounter for preprocedural cardiovascular examination: Secondary | ICD-10-CM | POA: Insufficient documentation

## 2011-09-05 DIAGNOSIS — I10 Essential (primary) hypertension: Secondary | ICD-10-CM

## 2011-09-05 DIAGNOSIS — K5792 Diverticulitis of intestine, part unspecified, without perforation or abscess without bleeding: Secondary | ICD-10-CM

## 2011-09-05 DIAGNOSIS — Z01818 Encounter for other preprocedural examination: Secondary | ICD-10-CM | POA: Insufficient documentation

## 2011-09-05 DIAGNOSIS — Z01812 Encounter for preprocedural laboratory examination: Secondary | ICD-10-CM | POA: Insufficient documentation

## 2011-09-05 HISTORY — DX: Essential (primary) hypertension: I10

## 2011-09-05 HISTORY — DX: Hypothyroidism, unspecified: E03.9

## 2011-09-05 HISTORY — DX: Sleep apnea, unspecified: G47.30

## 2011-09-05 HISTORY — DX: Cerebral infarction, unspecified: I63.9

## 2011-09-05 HISTORY — PX: ELBOW SURGERY: SHX618

## 2011-09-05 HISTORY — PX: THYROIDECTOMY, PARTIAL: SHX18

## 2011-09-05 HISTORY — DX: Spondylosis without myelopathy or radiculopathy, cervical region: M47.812

## 2011-09-05 HISTORY — DX: Diverticulitis of intestine, part unspecified, without perforation or abscess without bleeding: K57.92

## 2011-09-05 LAB — SURGICAL PCR SCREEN
MRSA, PCR: NEGATIVE
Staphylococcus aureus: NEGATIVE

## 2011-09-05 LAB — CBC
HCT: 37.7 % (ref 36.0–46.0)
MCHC: 34.5 g/dL (ref 30.0–36.0)
MCV: 87.5 fL (ref 78.0–100.0)
Platelets: 172 10*3/uL (ref 150–400)
RDW: 13 % (ref 11.5–15.5)
WBC: 4.9 10*3/uL (ref 4.0–10.5)

## 2011-09-05 LAB — BASIC METABOLIC PANEL
CO2: 23 mEq/L (ref 19–32)
Calcium: 9.7 mg/dL (ref 8.4–10.5)
GFR calc non Af Amer: 88 mL/min — ABNORMAL LOW (ref >90)
Glucose, Bld: 89 mg/dL (ref 70–99)
Potassium: 3.6 mEq/L (ref 3.5–5.1)
Sodium: 140 mEq/L (ref 135–145)

## 2011-09-05 LAB — TYPE AND SCREEN: ABO/RH(D): O POS

## 2011-09-05 NOTE — Patient Instructions (Addendum)
20 Abigail [name]  09/05/2011   Your procedure is scheduled on: 09-07-11  Report to [facility] at 0630 AM.  Call this number if you have problems the morning of surgery: [phone]   Remember:   Do not eat food:After Midnight.  May have clear liquids:until Midnight .  Clear liquids include soda, tea, black coffee, apple or grape juice, broth.  Take these medicines the morning of surgery with A SIP OF WATER: Levothyroxine, Tramadol   Do not wear jewelry, make-up or nail polish.  Do not wear lotions, powders, or perfumes. You may wear deodorant.  Do not shave 48 hours prior to surgery.  Do not bring valuables to the hospital.  Contacts, dentures or bridgework may not be worn into surgery.  Leave suitcase in the car. After surgery it may be brought to your room.  For patients admitted to the hospital, checkout time is 11:00 AM the day of discharge.   Patients discharged the day of surgery will not be allowed to drive home.  Name and phone number of your driver: spouse Special Instructions: CHG Shower Use Special Wash: 1/2 bottle night before surgery and 1/2 bottle morning of surgery.             Follow bowel prep instructions per Dr. Johna Sheriff.   Please read over the following fact sheets that you were given: Blood Transfusion Information and MRSA Information

## 2011-09-05 NOTE — Pre-Procedure Instructions (Signed)
09-05-11 (06-11-10) Cardiac Cath report with chart.

## 2011-09-06 ENCOUNTER — Encounter (HOSPITAL_COMMUNITY): Admission: RE | Payer: Self-pay | Source: Ambulatory Visit

## 2011-09-06 ENCOUNTER — Ambulatory Visit (HOSPITAL_COMMUNITY)
Admission: RE | Admit: 2011-09-06 | Payer: Medicare Other | Source: Ambulatory Visit | Admitting: Obstetrics and Gynecology

## 2011-09-06 SURGERY — LAPAROSCOPY OPERATIVE
Anesthesia: General

## 2011-09-06 NOTE — Pre-Procedure Instructions (Signed)
08-31-11, 09-06-11 call to [facility] office to inquire of any antibiotic order(spoke with Pattricia Boss).

## 2011-09-07 ENCOUNTER — Encounter (HOSPITAL_COMMUNITY): Payer: Self-pay | Admitting: *Deleted

## 2011-09-07 ENCOUNTER — Inpatient Hospital Stay (HOSPITAL_COMMUNITY)
Admission: RE | Admit: 2011-09-07 | Discharge: 2011-09-12 | DRG: 331 | Disposition: A | Discharge status: Home / Self Care | Payer: Medicare Other | Source: Ambulatory Visit | Attending: General Surgery | Admitting: General Surgery

## 2011-09-07 ENCOUNTER — Encounter (HOSPITAL_COMMUNITY): Payer: Self-pay | Admitting: Certified Registered Nurse Anesthetist

## 2011-09-07 ENCOUNTER — Other Ambulatory Visit (INDEPENDENT_AMBULATORY_CARE_PROVIDER_SITE_OTHER): Payer: Self-pay | Admitting: General Surgery

## 2011-09-07 ENCOUNTER — Inpatient Hospital Stay (HOSPITAL_COMMUNITY): Payer: Medicare Other | Admitting: Certified Registered Nurse Anesthetist

## 2011-09-07 ENCOUNTER — Encounter (HOSPITAL_COMMUNITY)
Admission: RE | Disposition: A | Discharge status: Home / Self Care | Payer: Self-pay | Source: Ambulatory Visit | Attending: General Surgery

## 2011-09-07 DIAGNOSIS — K5732 Diverticulitis of large intestine without perforation or abscess without bleeding: Secondary | ICD-10-CM

## 2011-09-07 DIAGNOSIS — K5730 Diverticulosis of large intestine without perforation or abscess without bleeding: Secondary | ICD-10-CM

## 2011-09-07 DIAGNOSIS — N83209 Unspecified ovarian cyst, unspecified side: Secondary | ICD-10-CM | POA: Diagnosis present

## 2011-09-07 DIAGNOSIS — I10 Essential (primary) hypertension: Secondary | ICD-10-CM | POA: Diagnosis present

## 2011-09-07 DIAGNOSIS — E039 Hypothyroidism, unspecified: Secondary | ICD-10-CM | POA: Diagnosis present

## 2011-09-07 DIAGNOSIS — N7013 Chronic salpingitis and oophoritis: Secondary | ICD-10-CM

## 2011-09-07 HISTORY — PX: LAPAROSCOPY: SHX197

## 2011-09-07 HISTORY — PX: COLON RESECTION: SHX5231

## 2011-09-07 HISTORY — PX: SALPINGOOPHORECTOMY: SHX82

## 2011-09-07 LAB — CBC
HCT: 33.6 % — ABNORMAL LOW (ref 36.0–46.0)
Hemoglobin: 11.6 g/dL — ABNORMAL LOW (ref 12.0–15.0)
MCH: 29.7 pg (ref 26.0–34.0)
MCHC: 34.5 g/dL (ref 30.0–36.0)
MCV: 86.2 fL (ref 78.0–100.0)
Platelets: 134 K/uL — ABNORMAL LOW (ref 150–400)
RBC: 3.9 MIL/uL (ref 3.87–5.11)
RDW: 12.7 % (ref 11.5–15.5)
WBC: 11 K/uL — ABNORMAL HIGH (ref 4.0–10.5)

## 2011-09-07 SURGERY — LAPAROSCOPIC SIGMOID COLON RESECTION
Anesthesia: General | Site: Abdomen | Laterality: Right | Wound class: Clean Contaminated

## 2011-09-07 MED ORDER — LIDOCAINE HCL (CARDIAC) 20 MG/ML IV SOLN
INTRAVENOUS | Status: DC | PRN
  Administered 2011-09-07: 70 mg via INTRAVENOUS

## 2011-09-07 MED ORDER — GLYCOPYRROLATE 0.2 MG/ML IJ SOLN
INTRAMUSCULAR | Status: DC | PRN
  Administered 2011-09-07: .8 mg via INTRAVENOUS

## 2011-09-07 MED ORDER — DEXAMETHASONE SODIUM PHOSPHATE 10 MG/ML IJ SOLN
INTRAMUSCULAR | Status: DC | PRN
  Administered 2011-09-07: 10 mg via INTRAVENOUS

## 2011-09-07 MED ORDER — MIDAZOLAM HCL 5 MG/5ML IJ SOLN
INTRAMUSCULAR | Status: DC | PRN
  Administered 2011-09-07: 2 mg via INTRAVENOUS

## 2011-09-07 MED ORDER — ONDANSETRON HCL 4 MG/2ML IJ SOLN
INTRAMUSCULAR | Status: DC | PRN
  Administered 2011-09-07 (×2): 4 mg via INTRAVENOUS

## 2011-09-07 MED ORDER — PROMETHAZINE HCL 25 MG/ML IJ SOLN
INTRAMUSCULAR | Status: AC
  Filled 2011-09-07: qty 1

## 2011-09-07 MED ORDER — MORPHINE SULFATE 2 MG/ML IJ SOLN
2.0000 mg | INTRAMUSCULAR | Status: DC | PRN
  Administered 2011-09-07 (×2): 2 mg via INTRAVENOUS

## 2011-09-07 MED ORDER — FENTANYL CITRATE 0.05 MG/ML IJ SOLN: 25.0000 ug | INTRAMUSCULAR | Status: DC | PRN

## 2011-09-07 MED ORDER — ACETAMINOPHEN 10 MG/ML IV SOLN
INTRAVENOUS | Status: AC
  Filled 2011-09-07: qty 100

## 2011-09-07 MED ORDER — NEOSTIGMINE METHYLSULFATE 1 MG/ML IJ SOLN
INTRAMUSCULAR | Status: DC | PRN
  Administered 2011-09-07: 5 mg via INTRAVENOUS

## 2011-09-07 MED ORDER — BUPIVACAINE LIPOSOME 1.3 % IJ SUSP
20.0000 mL | INTRAMUSCULAR | Status: AC
  Administered 2011-09-07: 20 mL
  Filled 2011-09-07: qty 20

## 2011-09-07 MED ORDER — 0.9 % SODIUM CHLORIDE (POUR BTL) OPTIME
TOPICAL | Status: DC | PRN
  Administered 2011-09-07 (×2): 1000 mL

## 2011-09-07 MED ORDER — ONDANSETRON HCL 4 MG/2ML IJ SOLN: 4.0000 mg | Freq: Four times a day (QID) | INTRAMUSCULAR | Status: DC | PRN

## 2011-09-07 MED ORDER — FENTANYL CITRATE 0.05 MG/ML IJ SOLN
INTRAMUSCULAR | Status: DC | PRN
  Administered 2011-09-07: 100 ug via INTRAVENOUS
  Administered 2011-09-07 (×8): 50 ug via INTRAVENOUS

## 2011-09-07 MED ORDER — SODIUM CHLORIDE 0.9 % IV SOLN
1.0000 g | INTRAVENOUS | Status: DC
  Filled 2011-09-07: qty 1

## 2011-09-07 MED ORDER — LACTATED RINGERS IV SOLN
INTRAVENOUS | Status: DC
  Administered 2011-09-07: 14:00:00 via INTRAVENOUS

## 2011-09-07 MED ORDER — ALVIMOPAN 12 MG PO CAPS
12.0000 mg | ORAL_CAPSULE | Freq: Two times a day (BID) | ORAL | Status: DC
  Administered 2011-09-08 – 2011-09-11 (×8): 12 mg via ORAL
  Filled 2011-09-07 (×10): qty 1

## 2011-09-07 MED ORDER — BUPIVACAINE HCL 0.5 % IJ SOLN
INTRAMUSCULAR | Status: DC | PRN
  Administered 2011-09-07: 50 mL

## 2011-09-07 MED ORDER — SUCCINYLCHOLINE CHLORIDE 20 MG/ML IJ SOLN
INTRAMUSCULAR | Status: DC | PRN
  Administered 2011-09-07: 100 mg via INTRAVENOUS

## 2011-09-07 MED ORDER — PHENYLEPHRINE HCL 10 MG/ML IJ SOLN
10.0000 mg | INTRAVENOUS | Status: DC | PRN
  Administered 2011-09-07: 20 ug/min via INTRAVENOUS

## 2011-09-07 MED ORDER — HEPARIN SODIUM (PORCINE) 5000 UNIT/ML IJ SOLN
5000.0000 [IU] | Freq: Three times a day (TID) | INTRAMUSCULAR | Status: DC
  Administered 2011-09-08 – 2011-09-12 (×13): 5000 [IU] via SUBCUTANEOUS
  Filled 2011-09-07 (×16): qty 1

## 2011-09-07 MED ORDER — KCL IN DEXTROSE-NACL 20-5-0.9 MEQ/L-%-% IV SOLN
INTRAVENOUS | Status: DC
  Administered 2011-09-07 – 2011-09-10 (×8): via INTRAVENOUS
  Filled 2011-09-07 (×10): qty 1000

## 2011-09-07 MED ORDER — MORPHINE SULFATE 10 MG/ML IJ SOLN
INTRAMUSCULAR | Status: AC
  Filled 2011-09-07: qty 1

## 2011-09-07 MED ORDER — MORPHINE SULFATE 2 MG/ML IJ SOLN
2.0000 mg | INTRAMUSCULAR | Status: DC | PRN
  Administered 2011-09-07: 2 mg via INTRAVENOUS
  Administered 2011-09-08 (×2): 4 mg via INTRAVENOUS
  Administered 2011-09-08 – 2011-09-09 (×4): 2 mg via INTRAVENOUS
  Administered 2011-09-09: 6 mg via INTRAVENOUS
  Administered 2011-09-09: 4 mg via INTRAVENOUS
  Filled 2011-09-07 (×2): qty 1
  Filled 2011-09-07: qty 2
  Filled 2011-09-07: qty 3
  Filled 2011-09-07 (×3): qty 1
  Filled 2011-09-07 (×2): qty 2
  Filled 2011-09-07: qty 1

## 2011-09-07 MED ORDER — BUPIVACAINE HCL 0.5 % IJ SOLN
INTRAMUSCULAR | Status: AC
  Filled 2011-09-07: qty 2

## 2011-09-07 MED ORDER — FAT EMULSION 20 % IV EMUL
INTRAVENOUS | Status: AC
  Filled 2011-09-07: qty 500

## 2011-09-07 MED ORDER — SODIUM CHLORIDE 0.9 % IV SOLN
1.0000 g | INTRAVENOUS | Status: DC | PRN
  Administered 2011-09-07: 1 g via INTRAVENOUS

## 2011-09-07 MED ORDER — LACTATED RINGERS IV SOLN
INTRAVENOUS | Status: DC | PRN
  Administered 2011-09-07 (×4): via INTRAVENOUS

## 2011-09-07 MED ORDER — ALVIMOPAN 12 MG PO CAPS
12.0000 mg | ORAL_CAPSULE | Freq: Once | ORAL | Status: AC
  Administered 2011-09-07: 12 mg via ORAL

## 2011-09-07 MED ORDER — PROMETHAZINE HCL 25 MG/ML IJ SOLN
6.2500 mg | INTRAMUSCULAR | Status: DC | PRN
  Administered 2011-09-07: 6.25 mg via INTRAVENOUS

## 2011-09-07 MED ORDER — ACETAMINOPHEN 10 MG/ML IV SOLN
INTRAVENOUS | Status: DC | PRN
  Administered 2011-09-07: 1000 mg via INTRAVENOUS

## 2011-09-07 MED ORDER — EPHEDRINE SULFATE 50 MG/ML IJ SOLN
INTRAMUSCULAR | Status: DC | PRN
  Administered 2011-09-07 (×2): 10 mg via INTRAVENOUS

## 2011-09-07 MED ORDER — LACTATED RINGERS IR SOLN
Status: DC | PRN
  Administered 2011-09-07: 3000 mL

## 2011-09-07 MED ORDER — PROPOFOL 10 MG/ML IV EMUL
INTRAVENOUS | Status: DC | PRN
  Administered 2011-09-07: 150 mg via INTRAVENOUS

## 2011-09-07 MED ORDER — ROCURONIUM BROMIDE 100 MG/10ML IV SOLN
INTRAVENOUS | Status: DC | PRN
  Administered 2011-09-07 (×3): 10 mg via INTRAVENOUS
  Administered 2011-09-07: 40 mg via INTRAVENOUS
  Administered 2011-09-07: 10 mg via INTRAVENOUS
  Administered 2011-09-07: 20 mg via INTRAVENOUS
  Administered 2011-09-07: 10 mg via INTRAVENOUS
  Administered 2011-09-07: 30 mg via INTRAVENOUS

## 2011-09-07 MED ORDER — SODIUM CHLORIDE 0.9 % IV SOLN
INTRAVENOUS | Status: AC
  Filled 2011-09-07: qty 50

## 2011-09-07 MED ORDER — ONDANSETRON HCL 4 MG PO TABS: 4.0000 mg | ORAL_TABLET | Freq: Four times a day (QID) | ORAL | Status: DC | PRN

## 2011-09-07 SURGICAL SUPPLY — 108 items
ADH SKN CLS APL DERMABOND .7 (GAUZE/BANDAGES/DRESSINGS) ×3
APPLIER CLIP 5 13 M/L LIGAMAX5 (MISCELLANEOUS)
APPLIER CLIP ROT 10 11.4 M/L (STAPLE) ×4
APR CLP MED LRG 11.4X10 (STAPLE) ×3
APR CLP MED LRG 5 ANG JAW (MISCELLANEOUS)
BAG SPEC RTRVL LRG 6X4 10 (ENDOMECHANICALS) ×6
BAG URINE DRAINAGE (UROLOGICAL SUPPLIES) IMPLANT
BAG URO CATCHER STRL LF (DRAPE) IMPLANT
BLADE EXTENDED COATED 6.5IN (ELECTRODE) ×4 IMPLANT
BLADE HEX COATED 2.75 (ELECTRODE) ×4 IMPLANT
BLADE SURG SZ10 CARB STEEL (BLADE) ×4 IMPLANT
BLADE SURG SZ11 CARB STEEL (BLADE) ×2 IMPLANT
CABLE HIGH FREQUENCY MONO STRZ (ELECTRODE) ×8 IMPLANT
CANISTER SUCTION 2500CC (MISCELLANEOUS) ×4 IMPLANT
CATH FOLEY SILVER 30CC 28FR (CATHETERS) IMPLANT
CELLS DAT CNTRL 66122 CELL SVR (MISCELLANEOUS) ×3 IMPLANT
CLIP APPLIE 5 13 M/L LIGAMAX5 (MISCELLANEOUS) ×2 IMPLANT
CLIP APPLIE ROT 10 11.4 M/L (STAPLE) ×3 IMPLANT
CLOTH BEACON ORANGE TIMEOUT ST (SAFETY) ×8 IMPLANT
COVER MAYO STAND STRL (DRAPES) ×8 IMPLANT
CUTTER FLEX LINEAR 45M (STAPLE) ×2 IMPLANT
DECANTER SPIKE VIAL GLASS SM (MISCELLANEOUS) ×4 IMPLANT
DERMABOND ADVANCED (GAUZE/BANDAGES/DRESSINGS) ×1
DERMABOND ADVANCED .7 DNX12 (GAUZE/BANDAGES/DRESSINGS) ×1 IMPLANT
DRAIN CHANNEL 19F RND (DRAIN) ×4 IMPLANT
DRAPE CAMERA CLOSED 9X96 (DRAPES) ×4 IMPLANT
DRAPE LAPAROSCOPIC ABDOMINAL (DRAPES) ×2 IMPLANT
DRAPE LG THREE QUARTER DISP (DRAPES) ×4 IMPLANT
DRAPE WARM FLUID 44X44 (DRAPE) ×6 IMPLANT
ELECT REM PT RETURN 9FT ADLT (ELECTROSURGICAL) ×4
ELECTRODE REM PT RTRN 9FT ADLT (ELECTROSURGICAL) ×3 IMPLANT
FILTER SMOKE EVAC LAPAROSHD (FILTER) IMPLANT
GLOVE BIO SURGEON STRL SZ8 (GLOVE) ×6 IMPLANT
GLOVE BIOGEL PI IND STRL 6.5 (GLOVE) ×4 IMPLANT
GLOVE BIOGEL PI IND STRL 7.0 (GLOVE) ×8 IMPLANT
GLOVE BIOGEL PI INDICATOR 6.5 (GLOVE) ×4
GLOVE BIOGEL PI INDICATOR 7.0 (GLOVE) ×6
GLOVE ECLIPSE 8.0 STRL XLNG CF (GLOVE) ×8 IMPLANT
GLOVE INDICATOR 8.0 STRL GRN (GLOVE) ×6 IMPLANT
GLOVE SURG SS PI 7.0 STRL IVOR (GLOVE) ×6 IMPLANT
GLOVE SURG SS PI 7.5 STRL IVOR (GLOVE) ×6 IMPLANT
GOWN STRL NON-REIN LRG LVL3 (GOWN DISPOSABLE) ×8 IMPLANT
GOWN STRL REIN XL XLG (GOWN DISPOSABLE) ×8 IMPLANT
GRASPER LAPSCPC 5X35 EPIX (ENDOMECHANICALS) IMPLANT
HAND ACTIVATED (MISCELLANEOUS) ×2 IMPLANT
KIT BASIN OR (CUSTOM PROCEDURE TRAY) ×4 IMPLANT
LEGGING LITHOTOMY PAIR STRL (DRAPES) IMPLANT
LIGASURE IMPACT 36 18CM CVD LR (INSTRUMENTS) IMPLANT
NS IRRIG 1000ML POUR BTL (IV SOLUTION) ×4 IMPLANT
PACK GENERAL/GYN (CUSTOM PROCEDURE TRAY) ×2 IMPLANT
PENCIL BUTTON HOLSTER BLD 10FT (ELECTRODE) ×4 IMPLANT
POUCH SPECIMEN RETRIEVAL 10MM (ENDOMECHANICALS) ×4 IMPLANT
PUMP PAIN ON-Q (MISCELLANEOUS) ×2 IMPLANT
RELOAD 45 VASCULAR/THIN (ENDOMECHANICALS) ×4 IMPLANT
RELOAD STAPLE 45 2.5 WHT GRN (ENDOMECHANICALS) IMPLANT
RELOAD STAPLE 45 3.5 BLU ETS (ENDOMECHANICALS) IMPLANT
RELOAD STAPLE TA45 3.5 REG BLU (ENDOMECHANICALS) ×8 IMPLANT
RETRACTOR WND ALEXIS 18 MED (MISCELLANEOUS) IMPLANT
RTRCTR WOUND ALEXIS 18CM MED (MISCELLANEOUS) ×4
SCISSORS LAP 5X35 DISP (ENDOMECHANICALS) ×4 IMPLANT
SEALER TISSUE G2 CVD JAW 45CM (ENDOMECHANICALS) ×2 IMPLANT
SET IRRIG TUBING LAPAROSCOPIC (IRRIGATION / IRRIGATOR) ×4 IMPLANT
SHEET LAVH (DRAPES) ×2 IMPLANT
SLEEVE ADV FIXATION 12X100MM (TROCAR) ×2 IMPLANT
SLEEVE ADV FIXATION 5X100MM (TROCAR) ×2 IMPLANT
SLEEVE Z-THREAD 5X100MM (TROCAR) ×4 IMPLANT
SOLUTION ANTI FOG 6CC (MISCELLANEOUS) ×4 IMPLANT
SPONGE GAUZE 4X4 12PLY (GAUZE/BANDAGES/DRESSINGS) ×4 IMPLANT
SPONGE LAP 18X18 X RAY DECT (DISPOSABLE) ×8 IMPLANT
STAPLER CIRC CVD 29MM 37CM (STAPLE) ×2 IMPLANT
STAPLER VISISTAT 35W (STAPLE) ×4 IMPLANT
SUCTION POOLE TIP (SUCTIONS) ×4 IMPLANT
SUT MNCRL AB 4-0 PS2 18 (SUTURE) ×2 IMPLANT
SUT NOVA NAB GS-21 0 18 T12 DT (SUTURE) ×4 IMPLANT
SUT PDS AB 0 CT1 36 (SUTURE) ×4 IMPLANT
SUT PDS AB 1 CTX 36 (SUTURE) ×8 IMPLANT
SUT PDS AB 1 TP1 96 (SUTURE) IMPLANT
SUT PROLENE 2 0 KS (SUTURE) ×2 IMPLANT
SUT SILK 2 0 (SUTURE) ×4
SUT SILK 2 0 SH CR/8 (SUTURE) ×4 IMPLANT
SUT SILK 2-0 18XBRD TIE 12 (SUTURE) ×3 IMPLANT
SUT SILK 3 0 (SUTURE) ×4
SUT SILK 3 0 SH CR/8 (SUTURE) ×4 IMPLANT
SUT SILK 3-0 18XBRD TIE 12 (SUTURE) ×3 IMPLANT
SUT VIC AB 2-0 SH 18 (SUTURE) ×8 IMPLANT
SUT VICRYL 2 0 18  UND BR (SUTURE) ×2
SUT VICRYL 2 0 18 UND BR (SUTURE) ×6 IMPLANT
SYR 30ML LL (SYRINGE) IMPLANT
SYRINGE IRR TOOMEY STRL 70CC (SYRINGE) IMPLANT
SYS LAPSCP GELPORT 120MM (MISCELLANEOUS)
SYSTEM LAPSCP GELPORT 120MM (MISCELLANEOUS) IMPLANT
TAPE STRIPS DRAPE STRL (GAUZE/BANDAGES/DRESSINGS) ×2 IMPLANT
TOWEL OR 17X26 10 PK STRL BLUE (TOWEL DISPOSABLE) ×8 IMPLANT
TRAY FOLEY CATH 14FRSI W/METER (CATHETERS) ×4 IMPLANT
TRAY LAP CHOLE (CUSTOM PROCEDURE TRAY) ×6 IMPLANT
TROCAR ADV FIXATION 11X100MM (TROCAR) ×2 IMPLANT
TROCAR ADV FIXATION 5X100MM (TROCAR) ×2 IMPLANT
TROCAR BALLN 12MMX100 BLUNT (TROCAR) ×2 IMPLANT
TROCAR XCEL BLUNT TIP 100MML (ENDOMECHANICALS) ×4 IMPLANT
TROCAR XCEL NON-BLD 11X100MML (ENDOMECHANICALS) ×2 IMPLANT
TROCAR Z-THREAD FIOS 11X100 BL (TROCAR) ×4 IMPLANT
TROCAR Z-THREAD FIOS 12X100MM (TROCAR) ×2 IMPLANT
TROCAR Z-THREAD FIOS 5X100MM (TROCAR) ×10 IMPLANT
TROCAR Z-THREAD SLEEVE 11X100 (TROCAR) IMPLANT
TUBING CONNECTING 10 (TUBING) ×2 IMPLANT
TUBING FILTER THERMOFLATOR (ELECTROSURGICAL) ×4 IMPLANT
YANKAUER SUCT BULB TIP 10FT TU (MISCELLANEOUS) ×4 IMPLANT
YANKAUER SUCT BULB TIP NO VENT (SUCTIONS) ×4 IMPLANT

## 2011-09-07 NOTE — Transfer of Care (Signed)
Immediate Anesthesia Transfer of Care Note  Patient: Abigail [name]  Procedure(s) Performed:  LAPAROSCOPY DIAGNOSTIC; SALPINGO OOPHERECTOMY; LAPAROSCOPIC SIGMOID COLON RESECTION - with proctoscopy  Patient Location: PACU  Anesthesia Type: General  Level of Consciousness: sedated, patient cooperative and responds to stimulaton  Airway & Oxygen Therapy: Patient Spontanous Breathing and Patient connected to face mask oxgen  Post-op Assessment: Report given to PACU RN and Post -op Vital signs reviewed and stable  Post vital signs: Reviewed and stable  Complications: No apparent anesthesia complications

## 2011-09-07 NOTE — Anesthesia Postprocedure Evaluation (Signed)
  Anesthesia Post-op Note  Patient: Abigail [name]  Procedure(s) Performed:  LAPAROSCOPY DIAGNOSTIC; SALPINGO OOPHERECTOMY; LAPAROSCOPIC SIGMOID COLON RESECTION - with proctoscopy  Patient Location: PACU  Anesthesia Type: General  Level of Consciousness: awake and alert   Airway and Oxygen Therapy: Patient Spontanous Breathing  Post-op Pain: mild  Post-op Assessment: Post-op Vital signs reviewed, Patient's Cardiovascular Status Stable, Respiratory Function Stable, Patent Airway and No signs of Nausea or vomiting  Post-op Vital Signs: stable  Complications: No apparent anesthesia complications

## 2011-09-07 NOTE — Progress Notes (Signed)
65 yo S/P hysterectomy with approximately 1.5 x 3.0 cm cystic mass in left adnexa most c/w hydrosalpinx.  Admitted for sigmoid resection for diverticulosis with Dr Johna Sheriff.  Laparoscopic BSO planned for patient if possible. Patient understands that due to adhesions it may be impossible to complete BSO. If bowel resection possible laparoscopically but BSO not possible with laparoscope, will not proceed to laparotomy for BSO only. Risks/benefits reviewed with patient. Exam unchanged. All questions answered.

## 2011-09-07 NOTE — Progress Notes (Signed)
Resting well, good pain relief, no nausea  Blood pressure 132/51, pulse 80, temperature 97.8 F (36.6 C), temperature source Axillary, resp. rate 16, height 5\' 5"  (1.651 m), weight 93.8 kg (206 lb 12.7 oz), SpO2 98.00%.  Lungs CTA Cor RRR Abd soft Ext PAS on  D/W pt and family my part of surgery FU office 2-3 weeks

## 2011-09-07 NOTE — Brief Op Note (Signed)
09/07/2011  9:46 AM  PATIENT:  Abigail [name]  65 y.o. female   PRE-OPERATIVE DIAGNOSIS:  Hydrosalpinx  POST-OPERATIVE DIAGNOSIS:  right hydro salpinx  PROCEDURE:  Procedure(s): DIAGNOSTIC LAPAROSCOPY, RIGHT SALPINGO OOPHERECTOMY  SURGEON:  Harold Hedge MD  PHYSICIAN ASSISTANT:   ASSISTANTS: none   ANESTHESIA:   general  EBL:  Total I/O In: 1000 [I.V.:1000] Out: -   BLOOD ADMINISTERED:none  DRAINS: Urinary Catheter (Foley)   LOCAL MEDICATIONS USED:  MARCAINE 8CC  SPECIMEN:  Source of Specimen:  right tube/ovary  DISPOSITION OF SPECIMEN:  PATHOLOGY  COUNTS:  YES  TOURNIQUET:  * No tourniquets in log *  DICTATION: .Other Dictation: Dictation Number [id number]  PLAN OF CARE: Dr Johna Sheriff proceeds with his proceedure  PATIENT DISPOSITION:  Stable in OR, all counts correct   Delay start of Pharmacological VTE agent (>24hrs) due to surgical blood loss or risk of bleeding:  {YES/NO/NOT APPLICABLE:20182

## 2011-09-07 NOTE — OR Nursing (Signed)
First case completed by Dr [name] at [phone] Dr Johna Sheriff scrubbed in at [phone] and made incision at [phone] for second procedure

## 2011-09-07 NOTE — Op Note (Signed)
Preoperative Diagnosis: diverticulitis of sigmoid colon and ovarian cyst  Postoprative Diagnosis: diverticulitis of sigmoid colon and ovarian cyst  Procedure: Procedure(s): LAPAROSCOPIC SIGMOID COLON RESECTION with takedown of splenic flexure Left oophorectomy   Surgeon: Glenna Fellows T   Assistants: Cyndia Bent M.D.  Anesthesia:  General endotracheal anesthesiaDiagnos  Indications: severe recurrent sigmoid diverticulitis and symptomatic ovarian cysts.    Procedure Detail: at the completion of laparoscopic right oophorectomy by Dr. [name].  I proceeded with planned sigmoid colon resection. Dr. [name] indicated that he was unable to get to the left ovary due to severe adhesions from her the patient's sigmoid diverticulitis and that he felt that a left nephrectomy was optional and that I could proceed with that based on findings during the procedure. The patient had a Hassan cannula at the umbilicus and a 5 mm suprapubic cannula. Under direct vision I placed a 12 mm trocar just medial and above the right iliac crest and a 5 mm trocar mirror image on the left. There was noted to be severe chronic inflammatory changes of the sigmoid colon with a loop matted to itself and dense adhesions to the lateral pelvic sidewall. Initially I approached the sigmoid colon medial to lateral placing the patient in steep Trendelenburg and moving the bowel loops to the right upper quadrant. I incised the peritoneum along the base of the sigmoid mesentery. However due to the severe adhesions and somewhat thickened mesentery I was not happy with the plane that I could develop from the medial to lateral approach as I could not really retract the colon anteriorly. We therefore began mobilizing lateral inflammatory adhesions with hook cautery and the harmonic scalpel mobilizing the sigmoid lateral to medial. As we did this we came across the left ovary with a large simple appearing cyst which was densely adherent  to the sigmoid colon and sigmoid mesentery laterally. We continued to mobilize and the ovarian vessels were clearly identified and more medially we identified the ureter and traced along its course beneath the sigmoid mesentery. The ovary was densely adherent to the colon and with a large cyst I felt that had to be removed and we clearly isolated the ovarian vessels and divided further adhesions to the ovary and the cyst completely mobilizing it to the vascular pedicle which was then divided with the harmonic scalpel with the ureter clearly in view posterior to this. After mobilizing the sigmoid we could see normal appearing rectosigmoid distal in the pelvis. The sigmoid was very redundant. I was then able to elevate the sigmoid mesentery from a medial approach and the mesentery was dissected laterally connecting to our lateral dissection and the inferior mesenteric vascular pedicle was isolated again with the ureter clearly  In view and away from the area and this pedicle was divided with a single firing of the white load 45 mm stapler. We then fully mobilized the left colon dividing lateral peritoneal attachments working toward the splenic flexure. With the patient in reverse Trendelenburg I mobilized the splenic flexure off of the introitus fascia and dissected the omentum up off the distal transverse colon with the Harmonic scalpel completely mobilizing the splenic flexure. We then continued to tack down toward the pelvis and further mobilizing the mesentery toward the midline for mobility. At this point we again went back to reverse Trendelenburg and pulling the rectosigmoid up out of the pelvis chose a point of resection at the rectosigmoid where there were no tinea or diverticular changes. The peritoneum on either side of this  area was dissected and careful blunt dissection was carried behind the rectosigmoid opening a window in the mesentery. The rectosigmoid was then divided with 2 firings of the 45 mm blue  load stapler. Elevating the distal end of the rectosigmoid the mesentery of the distal sigmoid was divided with the harmonic scalpel again keeping the ureter in clear view until the mesenteric dissection joined the area where it divided the inferior mesenteric pedicle. At this point we had complete mobility of the left and sigmoid colon. Approximately 6 cm oblique incision was made in the left lower quadrant and a muscle-splitting fashion and the peritoneum opened. The end of the proximal colon was brought out through the wound protector in place and the entire thickened and inflamed sigmoid mobilized well out of the abdomen. We chose a nice soft normal portion of distal left colon which was cleared of mesentery and then the mesentery down distally was divided between clamps and tied with 2-0 silk ties. A pursestring clamp was used to place a 2-0 Prolene pursestring suture through the distal left colon and the bowel was divided and specimen removed. There was plenty of length to extend down to the pubis. The end of the bowel was sized to a 29 mm dilator and the 29 mm anvil placed in the end of the bowel and the pursestring suture secured with nice healthy bowel wall over the anvil. This end of the bowel was then returned to the abdomen and the wound protector occluded and pneumoperitoneum reestablished. Dr. Jamey Ripa then went below and passed the circular stapler through the rectum and advanced up to the end of the divided rectosigmoid under direct vision. The spike was deployed just anterior to the middle of the staple line and the anvil connected and the stapler closed excluding any extraneous tissue. The stapler was fired and removed without difficulty. We had 2 complete tissue rings. With the bowel clamped just above the anastomosis and under saline irrigation after strep performed sigmoidoscopy and with the bowel tensely distended with air there was no evidence of leak. The abdomen was carefully inspected for  hemostasis or bowel injury and everything looked fine. Trochars were removed and all CO2 evacuated. The left lower quadrant incision was closed in layers with 0 PDS. The umbilical port site was closed with interrupted #1 Novafil. Skin incisions were closed with subcuticular Monocryl and Dermabond. Sponge needle and its contents were correct.     Estimated Blood Loss:  less than 100 mL         Drains: none  Blood Given: none          Specimens: #1 sigmoid colon #2 left ovary and cyst        Complications:  * No complications entered in OR log *         Disposition: PACU - hemodynamically stable.         Condition: stable   Mariella Saa MD, FACS  09/07/2011, 2:05 PM

## 2011-09-07 NOTE — Progress Notes (Deleted)
Notified NP Gunnar Fusi concerning obtaining diet order for patient, NP stated that patient could resume his regular diet Means, Anand Tejada N 09-07-11

## 2011-09-07 NOTE — Op Note (Signed)
Abigail [name], Abigail [name]                   ACCOUNT NO.:  [id number]  MEDICAL RECORD NO.:  [id number]  LOCATION:  [facility]                         FACILITY:  [facility]  PHYSICIAN:  Guy Sandifer. [name], M.D. DATE OF BIRTH:  [birth year]  DATE OF PROCEDURE:  09/07/2011 DATE OF DISCHARGE:                              OPERATIVE REPORT   PREOPERATIVE DIAGNOSIS:  Hydrosalpinx.  POSTOPERATIVE DIAGNOSIS:  Right hydrosalpinx.  PROCEDURE:  Diagnostic open laparoscopy with right salpingo- oophorectomy.  SURGEON:  Guy Sandifer. [name], M.D.  ANESTHESIA:  General with endotracheal intubation.  ESTIMATED BLOOD LOSS:  Drops.  INDICATIONS AND CONSENT:  This patient is a 65 year old female with previous hysterectomy who is admitted for laparoscopic resection of her sigmoid with Dr. Johna Sheriff.  She had an incidental finding of an approximately 1 x 3.5 cm hydrosalpinx in the left adnexa on evaluation in the office.  Laparoscopic BSO is discussed with the patient preoperatively.  She understands due to adhesive disease, it may not be possible to remove both ovaries.  She agrees that we will not proceed the laparotomy simply to do the BSO if it is not needed to accomplish her bowel resection.  Potential risks and complications have been discussed preoperatively including not limited to infection, organ damage, bleeding requiring transfusion of blood products with HIV and hepatitis acquisition, DVT, PE, pneumonia, laparotomy, pelvic abdominal pain.  All questions have been answered and consent is signed on the chart.  FINDINGS:  Upper abdomen is grossly normal.  Left ovary and left adnexa completely enveloped with adhesions from the sigmoid.  The right fallopian tube has a small hydrosalpinx approximately 1-2 cm in diameter and the right ovary is otherwise normal in appearance.  PROCEDURE:  The patient was taken to the operating room where she was identified, placed in dorsal supine position, general anesthesia  was induced via endotracheal intubation.  She was placed in the dorsal lithotomy position.  Time-out was undertaken.  She was prepped abdominally and vaginally.  Foley catheter was placed in the bladder and she was draped in a sterile fashion.  The infraumbilical and suprapubic areas were injected with 1.5% Marcaine, approximately 8 cc total.  An infraumbilical incision was made and dissection was carried out to the fascia.  The fascia was grasped with Kocher clamps, elevated, and incised.  Angle sutures were placed at 3 and 9 o'clock position with 0 Vicryl suture.  The peritoneal cavity was entered bluntly without difficulty.  10, 11 disposable Hasson trocar sleeve was then placed and anchored down.  Small suprapubic incision was made and a 5-mm disposable trocar sleeve was placed under direct visualization without difficulty. The above findings were noted.  Using the EnSeal products, the right infundibulopelvic ligaments taken down and the remainder of the ovaries resected.  The stoma are clear of the pelvic sidewall and the ureter. Good hemostasis was maintained.  Using the 5-mm laparoscope suprapubically, the EndoCatch was placed at the umbilical trocar sleeve and the ovary was retrieved through the umbilical incision without difficulty.  Trocar sleeve was replaced and reinspection revealed excellent hemostasis all around.  At this point, patient is stable and all counts are correct. Dr. Johna Sheriff enters the  room and proceeds with his procedure dictated under separate note.     Guy Sandifer [name], M.D.     JET/MEDQ  D:  09/07/2011  T:  09/07/2011  Job:  [id number]

## 2011-09-07 NOTE — Anesthesia Preprocedure Evaluation (Addendum)
Anesthesia Evaluation  Patient identified by MRN, date of birth, ID band Patient awake    Reviewed: Allergy & Precautions, H&P , NPO status , Patient's Chart, lab work & pertinent test results  Airway Mallampati: II TM Distance: >3 FB Neck ROM: Full    Dental No notable dental hx.    Pulmonary neg pulmonary ROS, sleep apnea (s/p uvppp. now asymptomatic) ,  clear to auscultation  Pulmonary exam normal       Cardiovascular hypertension, Pt. on medications neg cardio ROS Regular Normal    Neuro/Psych CVA, Residual Symptoms Negative Neurological ROS  Negative Psych ROS   GI/Hepatic negative GI ROS, Neg liver ROS,   Endo/Other  Negative Endocrine ROSHypothyroidism   Renal/GU negative Renal ROS  Genitourinary negative   Musculoskeletal negative musculoskeletal ROS (+)   Abdominal   Peds negative pediatric ROS (+)  Hematology negative hematology ROS (+)   Anesthesia Other Findings   Reproductive/Obstetrics negative OB ROS                          Anesthesia Physical Anesthesia Plan  ASA: II  Anesthesia Plan: General   Post-op Pain Management:    Induction: Intravenous  Airway Management Planned: Oral ETT  Additional Equipment:   Intra-op Plan:   Post-operative Plan: Extubation in OR  Informed Consent: I have reviewed the patients History and Physical, chart, labs and discussed the procedure including the risks, benefits and alternatives for the proposed anesthesia with the patient or authorized representative who has indicated his/her understanding and acceptance.   Dental advisory given  Plan Discussed with: CRNA  Anesthesia Plan Comments:         Anesthesia Quick Evaluation

## 2011-09-08 LAB — BASIC METABOLIC PANEL
CO2: 26 mEq/L (ref 19–32)
Calcium: 9.2 mg/dL (ref 8.4–10.5)
Chloride: 108 mEq/L (ref 96–112)
GFR calc Af Amer: >90 mL/min (ref >90)
Sodium: 140 mEq/L (ref 135–145)

## 2011-09-08 LAB — CBC
Platelets: 140 10*3/uL — ABNORMAL LOW (ref 150–400)
RBC: 3.78 MIL/uL — ABNORMAL LOW (ref 3.87–5.11)
WBC: 11 10*3/uL — ABNORMAL HIGH (ref 4.0–10.5)

## 2011-09-08 MED ORDER — BUTALBITAL-APAP-CAFFEINE 50-325-40 MG PO TABS
2.0000 | ORAL_TABLET | Freq: Four times a day (QID) | ORAL | Status: DC | PRN
  Filled 2011-09-08: qty 2

## 2011-09-08 MED ORDER — LEVOTHYROXINE SODIUM 125 MCG PO TABS
125.0000 ug | ORAL_TABLET | Freq: Every day | ORAL | Status: DC
  Administered 2011-09-08 – 2011-09-12 (×5): 125 ug via ORAL
  Filled 2011-09-08 (×5): qty 1

## 2011-09-08 MED ORDER — VERAPAMIL HCL ER 180 MG PO TBCR
180.0000 mg | EXTENDED_RELEASE_TABLET | Freq: Every day | ORAL | Status: DC
  Administered 2011-09-08 – 2011-09-11 (×4): 180 mg via ORAL
  Filled 2011-09-08 (×5): qty 1

## 2011-09-08 MED ORDER — LEVOTHYROXINE SODIUM 125 MCG PO TABS: 125.0000 ug | ORAL_TABLET | Freq: Every day | ORAL | Status: DC

## 2011-09-08 MED ORDER — BUTALBITAL-APAP-CAFFEINE 50-325-40 MG PO TABS
1.0000 | ORAL_TABLET | Freq: Four times a day (QID) | ORAL | Status: DC | PRN
  Administered 2011-09-08 – 2011-09-12 (×10): 1 via ORAL
  Filled 2011-09-08 (×10): qty 1

## 2011-09-08 NOTE — Progress Notes (Signed)
Pt states that she knows how to correctly use Incentive Spirometer and verbalizes understanding of its correct use. Pt educated by RN the benefits associated with IS use. Pt encouraged to use IS often throughout the day. Marcelino Duster, RN

## 2011-09-08 NOTE — Progress Notes (Signed)
Pt has had a low-grade fever throughout the day: 99.46F at 10:00, 99.44F at 14:00. Pt encouraged to use Incentive Spirometer to aid in helping to alleviate fever. Marcelino Duster, RN

## 2011-09-08 NOTE — Progress Notes (Signed)
Patient ID: Abigail [name], female   DOB: 02/01/1946, 65 y.o.   MRN: [id number] 1 Day Post-Op  Subjective: Some lower abd pain, relieved with meds.  No nausea  Objective: Vital signs in last 24 hours: Temp:  [94.2 F (34.6 C)-99.8 F (37.7 C)] 97.1 F (36.2 C) (12/20 0616) Pulse Rate:  [61-80] 66  (12/20 0616) Resp:  [12-18] 18  (12/20 0616) BP: (118-147)/(51-88) 119/74 mmHg (12/20 0616) SpO2:  [97 %-100 %] 98 % (12/20 0616) Weight:  [206 lb 12.7 oz (93.8 kg)] 206 lb 12.7 oz (93.8 kg) (12/19 1611) Last BM Date: 09/07/11  Intake/Output from previous day: 12/19 0701 - 12/20 0700 In: 6466 [I.V.:6366; IV Piggyback:100] Out: 3900 [Urine:3800; Blood:100] Intake/Output this shift:    General appearance: alert and no distress Resp: clear to auscultation bilaterally GI: abnormal findings:  moderate tenderness in the lower abdomen Incision/Wound:Clean and dry  Lab Results:   [facility] 09/08/11 0355 09/07/11 2229  WBC 11.0* 11.0*  HGB 11.3* 11.6*  HCT 33.2* 33.6*  PLT 140* 134*   BMET  Basename 09/08/11 0355 09/05/11 1130  NA 140 140  K 3.8 3.6  CL 108 108  CO2 26 23  GLUCOSE 138* 89  BUN 6 15  CREATININE 0.69 0.70  CALCIUM 9.2 9.7   PT/INR No results found for this basename: LABPROT:2,INR:2 in the last 72 hours ABG No results found for this basename: PHART:2,PCO2:2,PO2:2,HCO3:2 in the last 72 hours  Studies/Results: No results found.  Anti-infectives: Anti-infectives     Start     Dose/Rate Route Frequency Ordered Stop   09/07/11 0638   ertapenem [facility]) 1 g in sodium chloride 0.9 % 50 mL IVPB  Status:  Discontinued        1 g 100 mL/hr over 30 Minutes Intravenous 60 min pre-op 09/07/11 0638 09/07/11 1440          Assessment/Plan: s/p Procedure(s): LAPAROSCOPY DIAGNOSTIC SALPINGO OOPHERECTOMY LAPAROSCOPIC SIGMOID COLON RESECTION Stable post op.  On SQ heparin. Start CL diet   LOS: 1 day    Terrick Allred T 09/08/2011

## 2011-09-08 NOTE — Progress Notes (Signed)
Pt states that she takes Simvastatin x daily for cholesterol. RN did not see active order for the medication. Pt asked the the RN call MD to enquire about obtaining an order for medication. Notified MD, MD stated "pt does not need to be on the medication while in the hospital". Marcelino Duster, RN

## 2011-09-09 ENCOUNTER — Encounter (HOSPITAL_COMMUNITY): Payer: Self-pay | Admitting: General Surgery

## 2011-09-09 LAB — DIFFERENTIAL
Eosinophils Relative: 1 % (ref 0–5)
Lymphocytes Relative: 25 % (ref 12–46)
Lymphs Abs: 2 10*3/uL (ref 0.7–4.0)
Neutro Abs: 5.4 10*3/uL (ref 1.7–7.7)

## 2011-09-09 LAB — CBC
HCT: 31.2 % — ABNORMAL LOW (ref 36.0–46.0)
MCV: 88.6 fL (ref 78.0–100.0)
Platelets: 126 10*3/uL — ABNORMAL LOW (ref 150–400)
RBC: 3.52 MIL/uL — ABNORMAL LOW (ref 3.87–5.11)
WBC: 8.1 10*3/uL (ref 4.0–10.5)

## 2011-09-09 MED ORDER — MORPHINE SULFATE 15 MG PO TABS
15.0000 mg | ORAL_TABLET | ORAL | Status: DC | PRN
  Administered 2011-09-09 – 2011-09-10 (×4): 15 mg via ORAL
  Filled 2011-09-09 (×4): qty 1

## 2011-09-09 NOTE — Progress Notes (Signed)
Patient ID: Abigail [name], female   DOB: 11-10-1945, 65 y.o.   MRN: [id number] 2 Days Post-Op  Subjective: Feels much better today. Less pain. Up walking in the hall. Tolerating clear liquids without difficulty. She has had several small BMs with a little bit of blood. Denies nausea.  Objective: Vital signs in last 24 hours: Temp:  [98.7 F (37.1 C)-100.9 F (38.3 C)] 99.1 F (37.3 C) (12/21 0600) Pulse Rate:  [67-75] 68  (12/21 0600) Resp:  [16-19] 19  (12/21 0600) BP: (127-144)/(75-84) 135/84 mmHg (12/21 0600) SpO2:  [92 %-96 %] 92 % (12/21 0600) Last BM Date: 09/08/11  Intake/Output from previous day: 12/20 0701 - 12/21 0700 In: 2842.4 [P.O.:440; I.V.:2402.4] Out: 2800 [Urine:2800] Intake/Output this shift:    General appearance: alert and no distress Resp: clear to auscultation bilaterally GI: abnormal findings:  moderate tenderness in the LLQ Incision/Wound:clean and dry without signs of infection  Lab Results:   Basename 09/09/11 0350 09/08/11 0355  WBC 8.1 11.0*  HGB 10.6* 11.3*  HCT 31.2* 33.2*  PLT 126* 140*   BMET  Basename 09/08/11 0355  NA 140  K 3.8  CL 108  CO2 26  GLUCOSE 138*  BUN 6  CREATININE 0.69  CALCIUM 9.2   PT/INR No results found for this basename: LABPROT:2,INR:2 in the last 72 hours ABG No results found for this basename: PHART:2,PCO2:2,PO2:2,HCO3:2 in the last 72 hours  Studies/Results: No results found.  Anti-infectives: Anti-infectives     Start     Dose/Rate Route Frequency Ordered Stop   09/07/11 0638   ertapenem [facility]) 1 g in sodium chloride 0.9 % 50 mL IVPB  Status:  Discontinued        1 g 100 mL/hr over 30 Minutes Intravenous 60 min pre-op 09/07/11 4098 09/07/11 1440          Assessment/Plan: s/p Procedure(s): LAPAROSCOPY DIAGNOSTIC SALPINGO OOPHERECTOMY LAPAROSCOPIC SIGMOID COLON RESECTION Doing well postoperatively. She has a low-grade temp but less pain and white count is normal. I suspect this is pulmonary.  We'll advance to full liquid diet. Continue pulmonary toilet and mobility.   LOS: 2 days    Franky Reier T 09/09/2011

## 2011-09-10 MED ORDER — TRAMADOL HCL 50 MG PO TABS
50.0000 mg | ORAL_TABLET | Freq: Four times a day (QID) | ORAL | Status: DC | PRN
  Administered 2011-09-10 – 2011-09-12 (×5): 50 mg via ORAL
  Filled 2011-09-10 (×5): qty 1

## 2011-09-10 NOTE — Progress Notes (Signed)
Pt not having relief from current prescribed pain medicine.  Pt requesting to take home dose of ultram with foricet.  Notified Dr Biagio Quint. Order given.  Patient informed of changes in pain medicines and pt verbalizes understanding.  Pt instructed to call with call light within reach.

## 2011-09-10 NOTE — Progress Notes (Signed)
3 Days Post-Op  Subjective: +flatus and a few small bloody BM's.  Tolerating full liquids, no nausea   Objective: Vital signs in last 24 hours: Temp:  [98.6 F (37 C)-99.5 F (37.5 C)] 98.6 F (37 C) (12/22 0500) Pulse Rate:  [66-75] 66  (12/22 0500) Resp:  [16-18] 17  (12/22 0500) BP: (105-138)/(67-84) 122/70 mmHg (12/22 1013) SpO2:  [93 %-95 %] 94 % (12/22 0500) Last BM Date: 09/09/11  Intake/Output from previous day: 12/21 0701 - 12/22 0700 In: 900 [I.V.:900] Out: 3575 [Urine:3575] Intake/Output this shift: Total I/O In: 940 [P.O.:940] Out: 1300 [Urine:1300]  General appearance: alert, cooperative and no distress GI: soft, appropriate LLQ tenderness, ND, incisions without infectino  Lab Results:   Basename 09/09/11 0350 09/08/11 0355  WBC 8.1 11.0*  HGB 10.6* 11.3*  HCT 31.2* 33.2*  PLT 126* 140*   BMET  Basename 09/08/11 0355  NA 140  K 3.8  CL 108  CO2 26  GLUCOSE 138*  BUN 6  CREATININE 0.69  CALCIUM 9.2   PT/INR No results found for this basename: LABPROT:2,INR:2 in the last 72 hours ABG No results found for this basename: PHART:2,PCO2:2,PO2:2,HCO3:2 in the last 72 hours  Studies/Results: No results found.  Anti-infectives: Anti-infectives     Start     Dose/Rate Route Frequency Ordered Stop   09/07/11 0638   ertapenem (INVANZ) 1 g in sodium chloride 0.9 % 50 mL IVPB  Status:  Discontinued        1 g 100 mL/hr over 30 Minutes Intravenous 60 min pre-op 09/07/11 1610 09/07/11 1440          Assessment/Plan: s/p Procedure(s): LAPAROSCOPY DIAGNOSTIC SALPINGO OOPHERECTOMY LAPAROSCOPIC SIGMOID COLON RESECTION will continue on full liquids and likely advance tomorrow if still doing okay.  Check HGB in am   LOS: 3 days    Lodema Pilot DAVID 09/10/2011

## 2011-09-11 LAB — CBC
HCT: 33.7 % — ABNORMAL LOW (ref 36.0–46.0)
Hemoglobin: 11.3 g/dL — ABNORMAL LOW (ref 12.0–15.0)
MCV: 88 fL (ref 78.0–100.0)
RDW: 12.8 % (ref 11.5–15.5)
WBC: 4.8 10*3/uL (ref 4.0–10.5)

## 2011-09-11 NOTE — Progress Notes (Signed)
4 Days Post-Op  Subjective: Feels well, no nausea and would like to advance diet.  Objective: Vital signs in last 24 hours: Temp:  [99 F (37.2 C)-99.4 F (37.4 C)] 99 F (37.2 C) (12/23 0520) Pulse Rate:  [61-68] 61  (12/23 0520) Resp:  [18-20] 18  (12/23 0520) BP: (98-143)/(63-97) 120/72 mmHg (12/23 1018) SpO2:  [93 %-96 %] 93 % (12/23 0520) Last BM Date: 09/09/11  Intake/Output from previous day: 12/22 0701 - 12/23 0700 In: 2929.5 [P.O.:2040] Out: 4025 [Urine:4025] Intake/Output this shift: Total I/O In: 560 [P.O.:560] Out: -   General appearance: alert, cooperative and no distress GI: soft, no significant tenderness, nd, and wounds without infection  Lab Results:   Basename 09/11/11 0447 09/09/11 0350  WBC 4.8 8.1  HGB 11.3* 10.6*  HCT 33.7* 31.2*  PLT 151 126*   BMET No results found for this basename: NA:2,K:2,CL:2,CO2:2,GLUCOSE:2,BUN:2,CREATININE:2,CALCIUM:2 in the last 72 hours PT/INR No results found for this basename: LABPROT:2,INR:2 in the last 72 hours ABG No results found for this basename: PHART:2,PCO2:2,PO2:2,HCO3:2 in the last 72 hours  Studies/Results: No results found.  Anti-infectives: Anti-infectives     Start     Dose/Rate Route Frequency Ordered Stop   09/07/11 0638   ertapenem [facility]) 1 g in sodium chloride 0.9 % 50 mL IVPB  Status:  Discontinued        1 g 100 mL/hr over 30 Minutes Intravenous 60 min pre-op 09/07/11 1610 09/07/11 1440          Assessment/Plan: s/p Procedure(s): LAPAROSCOPY DIAGNOSTIC SALPINGO OOPHERECTOMY LAPAROSCOPIC SIGMOID COLON RESECTION Advance diet Plan for discharge tomorrow  LOS: 4 days    Abigail [name] DAVID 09/11/2011

## 2011-09-12 MED ORDER — TRAMADOL HCL 50 MG PO TABS: 50.0000 mg | ORAL_TABLET | Freq: Four times a day (QID) | ORAL | Status: AC | PRN

## 2011-09-12 NOTE — Progress Notes (Signed)
Patient ID: Abigail [name], female   DOB: 07/08/1946, 65 y.o.   MRN: [id number] 5 Days Post-Op  Subjective: Feels better daily, mild pain.  + BMs. tol diet  Objective: Vital signs in last 24 hours: Temp:  [98.2 F (36.8 C)-99.2 F (37.3 C)] 98.9 F (37.2 C) (12/24 0450) Pulse Rate:  [62] 62  (12/24 0450) Resp:  [18-20] 18  (12/24 0450) BP: (108-120)/(63-73) 108/68 mmHg (12/24 0450) SpO2:  [96 %-97 %] 97 % (12/24 0450) Last BM Date: 09/11/11  Intake/Output from previous day: 12/23 0701 - 12/24 0700 In: 920 [P.O.:920] Out: 850 [Urine:850] Intake/Output this shift:    General appearance: alert and no distress GI: abnormal findings:  mild tenderness in the LLQ, improved Incision/Wound:Clean and dry  Lab Results:   [facility] 09/11/11 0447  WBC 4.8  HGB 11.3*  HCT 33.7*  PLT 151   BMET No results found for this basename: NA:2,K:2,CL:2,CO2:2,GLUCOSE:2,BUN:2,CREATININE:2,CALCIUM:2 in the last 72 hours PT/INR No results found for this basename: LABPROT:2,INR:2 in the last 72 hours ABG No results found for this basename: PHART:2,PCO2:2,PO2:2,HCO3:2 in the last 72 hours  Studies/Results: No results found.  Anti-infectives: Anti-infectives     Start     Dose/Rate Route Frequency Ordered Stop   09/07/11 0638   ertapenem [facility]) 1 g in sodium chloride 0.9 % 50 mL IVPB  Status:  Discontinued        1 g 100 mL/hr over 30 Minutes Intravenous 60 min pre-op 09/07/11 1324 09/07/11 1440          Assessment/Plan: s/p Procedure(s): LAPAROSCOPY DIAGNOSTIC SALPINGO OOPHERECTOMY LAPAROSCOPIC SIGMOID COLON RESECTION Doing well postoperatively. Okay for discharge. Followup in the office in 2 weeks.   LOS: 5 days    Tiari Andringa T 09/12/2011

## 2011-09-12 NOTE — Discharge Summary (Signed)
   Patient ID: Abigail [name] [id number] 65 y.o. June 18, 1946  09/07/2011  Discharge date and time: 09/12/2011   Admitting Physician: Glenna Fellows T  Discharge Physician: Glenna Fellows T  Admission Diagnoses: diverticulitis of sigmoid colon  Discharge Diagnoses: same  Operations: Procedure(s): LAPAROSCOPY DIAGNOSTIC SALPINGO OOPHERECTOMY LAPAROSCOPIC SIGMOID COLON RESECTION  Admission Condition: fair  Discharged Condition: good  Indication for Admission: chronic and recurring sigmoid colon diverticulitis  Hospital Course: following a mechanical bowel prep at home the patient was admitted on the morning of her procedure. She underwent an uneventful laparoscopic sigmoid colectomy with anastomosis as well as bilateral salpingo-oophorectomy. Findings intraoperatively were consistent with severe chronic and acute sigmoid colon diverticulitis. Her postoperative course was relatively smooth. She had some moderate pain and nausea early postoperatively as expected controlled with medications. She was begun on a clear liquid diet on the first postoperative day and was able to be advanced over several days to a regular diet. By the third postoperative day she was having small bowel movements. Her hemoglobin was stable and white count decreased to normal. Pathology confirmed acute diverticulitis and ovarian cysts and hydrosalpinx. At the time of discharge she is tolerating a regular diet and having semi-formed bowel movements. Abdomen is minimally tender and wounds healing well.  Consults: none  Significant Diagnostic Studies: none  Disposition: Home  Patient Instructions:   Abigail, [name]  Home Medication Instructions [id number]   Printed on:09/12/11 0725  Medication Information                    traMADol (ULTRAM) 50 MG tablet Take 50 mg by mouth every 6 (six) hours as needed. PAIN            butalbital-acetaminophen-caffeine (FIORICET, ESGIC) 50-325-40 MG per tablet TAKE 1  TABLET BY MOUTH EVERY 6 HOURS AS NEEDED FOR HEADACHE.           Esomeprazole Magnesium (NEXIUM PO) Take 40 mg by mouth every evening.            Aspirin-Caffeine (ANACIN) 400-32 MG TABS Take 2 tablets by mouth every 6 (six) hours as needed. HEADACHE              metroNIDAZOLE (METROGEL) 0.75 % vaginal gel Place 1 Applicatorful vaginally at bedtime.             levothyroxine (SYNTHROID, LEVOTHROID) 125 MCG tablet             verapamil (VERELAN PM) 360 MG 24 hr capsule             simvastatin (ZOCOR) 40 MG tablet             clopidogrel (PLAVIX) 75 MG tablet             traMADol (ULTRAM) 50 MG tablet Take 1 tablet (50 mg total) by mouth every 6 (six) hours as needed. Maximum dose= 8 tablets per day             Activity: no heavy lifting for 3 weeks Diet: regular diet Wound Care: none needed  Follow-up:  With Dr. Johna Sheriff in 2 weeks.  Signed: Mariella Saa MD, FACS  09/12/2011, 7:25 AM

## 2011-09-14 NOTE — H&P (Signed)
Please see detailed history and physical from office visit on 08/26/2011. The patient was taken to the operating room initially today by Dr. Huntley Dec for a joint procedure and I was therefore unable to reexamine her preoperatively. Mariella Saa MD, FACS  09/14/2011, 11:14 AM

## 2011-09-15 ENCOUNTER — Telehealth: Payer: Self-pay

## 2011-09-15 MED ORDER — BUTALBITAL-APAP-CAFFEINE 50-325-40 MG PO TABS: 2.0000 | ORAL_TABLET | Freq: Four times a day (QID) | ORAL | Status: DC | PRN

## 2011-09-15 NOTE — Telephone Encounter (Signed)
ok 

## 2011-09-15 NOTE — Telephone Encounter (Signed)
Change made and new rx sent to [facility]

## 2011-09-15 NOTE — Telephone Encounter (Signed)
Pt states she had surgery on last Wednesday and came home from the hospital on 09/12/11. Pt states she cannot have any medication with codeine in it.  Pt has been taking Fioricet and tramadol for pain.  Pt states she usually takes 2 Fioricet q6h and would like to get an rx written for this quantity. [facility] [place]. Pls advise.

## 2011-09-16 ENCOUNTER — Telehealth (INDEPENDENT_AMBULATORY_CARE_PROVIDER_SITE_OTHER): Payer: Self-pay | Admitting: General Surgery

## 2011-09-16 NOTE — Telephone Encounter (Signed)
PT CALLED RE ABDOMINAL BLOATING NOTED FOR SEVERAL DAYS/ SHE HAD SURGERY ON 09-07-11/ SHE HAS NOT HAD A BM FOR SEVERAL DAYS/ SHE IS ONLY EATING SOUPS AND DRINKING LIQUIDS/ NO NAUSEA OR VOMITING/ OR EXCESSIVE PAIN/ PT STATES SHE IS PASSING GAS AND IS UP AND ABOUT WITH ACTIVITY. PHYSICAL ACTIVITY/WALKING ENCOURAGED/ I REVIEWED THIS WITH DR. HOXWORTH. HE SAID AS LONG AS SHE IS PASSING GAS IS GOOD. TRY TO INCREASE DIET GRADUALLY. IF PAIN NOT RELIEVED BY PAIN MED OR IF FEVER OR NAUSEA/VOMITING OCCUR CALL OFFICE/ PT INSTRUCTED/GY/09-16-11.

## 2011-09-23 ENCOUNTER — Encounter (INDEPENDENT_AMBULATORY_CARE_PROVIDER_SITE_OTHER): Payer: Self-pay | Admitting: General Surgery

## 2011-09-23 ENCOUNTER — Ambulatory Visit (INDEPENDENT_AMBULATORY_CARE_PROVIDER_SITE_OTHER): Payer: Medicare Other | Admitting: General Surgery

## 2011-09-23 VITALS — BP 154/96 | HR 66 | Temp 97.5°F | Ht 65.5 in | Wt 198.2 lb

## 2011-09-23 DIAGNOSIS — Z09 Encounter for follow-up examination after completed treatment for conditions other than malignant neoplasm: Secondary | ICD-10-CM

## 2011-09-23 MED ORDER — CIPROFLOXACIN HCL 500 MG PO TABS: 500.0000 mg | ORAL_TABLET | Freq: Two times a day (BID) | ORAL | Status: AC

## 2011-09-23 NOTE — Progress Notes (Signed)
Patient returns to the office 2 weeks following laparoscopic sigmoid colectomy for severe chronic diverticulitis and bilateral oophorectomy for benign ovarian cysts. She generally has been getting along well and has noticed some increasing pain and redness at her umbilicus for 2-3 days. She does not have drainage. No fever or chills. She does not have any generalized abdominal pain. She is eating okay with some expected minimal appetite. Bowels are moving regularly and returning to more normal from loose right after surgery.  On exam she is afebrile. She does not appear acutely ill. Abdomen is generally soft and nontender. There is an approximately 5 cm area of erythema just beneath her small umbilical incision and a smaller area of induration at the incision. No fluctuance.  Assessment and plan: Doing well except for some mild cellulitis at her umbilical incision. I am going to put her on Cipro b.i.d. for one week. She will call me if this is not improving after several days or worsens at any point. She has a return appointment in 3 weeks.

## 2011-09-27 ENCOUNTER — Encounter (INDEPENDENT_AMBULATORY_CARE_PROVIDER_SITE_OTHER): Payer: Self-pay | Admitting: General Surgery

## 2011-09-27 ENCOUNTER — Ambulatory Visit (INDEPENDENT_AMBULATORY_CARE_PROVIDER_SITE_OTHER): Payer: Medicare Other | Admitting: General Surgery

## 2011-09-27 VITALS — BP 154/92 | HR 64 | Temp 96.1°F | Ht 65.5 in | Wt 192.6 lb

## 2011-09-27 DIAGNOSIS — T8140XA Infection following a procedure, unspecified, initial encounter: Secondary | ICD-10-CM

## 2011-09-27 NOTE — Patient Instructions (Signed)
Tomorrow removed the gauze strip from the wound bed wash and a tub or shower daily and keep covered. Expect some drainage for several days. Call for any concerns and keep her scheduled appointment. Complete antibiotics.

## 2011-09-27 NOTE — Progress Notes (Signed)
Patient returns to the office. We placed her on antibiotics last week for some cellulitis around her umbilical incision. She noted some drainage over the last 48 hours. It's been bloody a little bit purulent. No fever or chills or generalized abdominal pain. Overall the area feels better.  On examination there is about a half centimeter skin separation just beneath the umbilicus. I cleaned this out with a Q-tip and it tracks about a centimeter deep with some slight purulent drainage. Overall the erythema and induration around the umbilicus has essentially resolved.  Assessment and plan: Superficial wound infection following laparoscopic colectomy. I think this is resolving. I cleaned the wound today and put a small piece of form gauze into the cavity which will remove tomorrow. She will shower daily and keep this covered. She has an appointment in 3 weeks and will call me sooner if this is not feeling steadily better.

## 2011-10-13 ENCOUNTER — Ambulatory Visit (INDEPENDENT_AMBULATORY_CARE_PROVIDER_SITE_OTHER): Payer: Medicare Other | Admitting: General Surgery

## 2011-10-13 ENCOUNTER — Encounter (INDEPENDENT_AMBULATORY_CARE_PROVIDER_SITE_OTHER): Payer: Self-pay | Admitting: General Surgery

## 2011-10-13 VITALS — BP 130/84 | HR 72 | Ht 65.5 in | Wt 192.4 lb

## 2011-10-13 DIAGNOSIS — Z09 Encounter for follow-up examination after completed treatment for conditions other than malignant neoplasm: Secondary | ICD-10-CM

## 2011-10-13 NOTE — Progress Notes (Signed)
History: Patient returns for more long-term followup after laparoscopic sigmoid colectomy for severe chronic diverticulitis as well as bilateral laparoscopic salpingo-oophorectomy. She  at this point is getting along very well. She denies pain. Valves are moving well. Appetite is good. She's getting back to normal activities. She still does has a tiny spot of drainage at her umbilical bandage where we had opened her wound.  Exam: She appears well. Abdomen is soft and nontender. There is just a 1-2 mm area of granulation tissue at her umbilical incision is clean and just about healed.  Assessment and plan: Doing well postoperatively. I told her the drainage at the umbilicus should stop within the week or 2. If it does not work she has any other concerns she will call as needed but did not schedule any further routine followup.

## 2011-11-10 ENCOUNTER — Telehealth: Payer: Self-pay | Admitting: *Deleted

## 2011-11-10 MED ORDER — BUTALBITAL-APAP-CAFFEINE 50-325-40 MG PO TABS: 2.0000 | ORAL_TABLET | Freq: Four times a day (QID) | ORAL | Status: DC | PRN

## 2011-11-10 NOTE — Telephone Encounter (Signed)
Pt wants 3 months written at the time not refills.

## 2011-11-10 NOTE — Telephone Encounter (Signed)
done

## 2011-11-10 NOTE — Telephone Encounter (Signed)
Pt is asking for larger amount of Fioricet called to [facility] than just one month.

## 2011-11-10 NOTE — Telephone Encounter (Signed)
#  120  One every 6 hours prn

## 2011-11-10 NOTE — Telephone Encounter (Signed)
Please advise -  Last written 09/05/11  # 60 2 RF

## 2012-02-28 ENCOUNTER — Other Ambulatory Visit: Payer: Self-pay | Admitting: Internal Medicine

## 2012-03-05 ENCOUNTER — Encounter: Payer: Self-pay | Admitting: Internal Medicine

## 2012-03-05 ENCOUNTER — Ambulatory Visit (INDEPENDENT_AMBULATORY_CARE_PROVIDER_SITE_OTHER): Payer: Medicare Other | Admitting: Internal Medicine

## 2012-03-05 VITALS — BP 128/80 | Temp 98.2°F | Wt 200.0 lb

## 2012-03-05 DIAGNOSIS — I10 Essential (primary) hypertension: Secondary | ICD-10-CM

## 2012-03-05 DIAGNOSIS — Z87448 Personal history of other diseases of urinary system: Secondary | ICD-10-CM

## 2012-03-05 DIAGNOSIS — R3129 Other microscopic hematuria: Secondary | ICD-10-CM

## 2012-03-05 DIAGNOSIS — Z8679 Personal history of other diseases of the circulatory system: Secondary | ICD-10-CM

## 2012-03-05 DIAGNOSIS — E785 Hyperlipidemia, unspecified: Secondary | ICD-10-CM

## 2012-03-05 DIAGNOSIS — E039 Hypothyroidism, unspecified: Secondary | ICD-10-CM

## 2012-03-05 DIAGNOSIS — R311 Benign essential microscopic hematuria: Secondary | ICD-10-CM

## 2012-03-05 LAB — POCT URINALYSIS DIPSTICK
Bilirubin, UA: NEGATIVE
Glucose, UA: NEGATIVE
Ketones, UA: NEGATIVE
Spec Grav, UA: 1.025
Urobilinogen, UA: 0.2

## 2012-03-05 LAB — LIPID PANEL
Cholesterol: 151 mg/dL (ref 0–200)
HDL: 77.8 mg/dL (ref >39.00)
Triglycerides: 62 mg/dL (ref 0.0–149.0)
VLDL: 12.4 mg/dL (ref 0.0–40.0)

## 2012-03-05 MED ORDER — SIMVASTATIN 40 MG PO TABS: 40.0000 mg | ORAL_TABLET | Freq: Every day | ORAL | Status: DC

## 2012-03-05 MED ORDER — TRAMADOL HCL 50 MG PO TABS: 50.0000 mg | ORAL_TABLET | Freq: Four times a day (QID) | ORAL | Status: DC | PRN

## 2012-03-05 MED ORDER — CLOPIDOGREL BISULFATE 75 MG PO TABS: 75.0000 mg | ORAL_TABLET | Freq: Every day | ORAL | Status: DC

## 2012-03-05 MED ORDER — DICLOFENAC SODIUM 75 MG PO TBEC: 75.0000 mg | DELAYED_RELEASE_TABLET | Freq: Two times a day (BID) | ORAL | Status: DC

## 2012-03-05 MED ORDER — LEVOTHYROXINE SODIUM 125 MCG PO TABS: 125.0000 ug | ORAL_TABLET | Freq: Every day | ORAL | Status: DC

## 2012-03-05 MED ORDER — VERAPAMIL HCL ER 360 MG PO CP24: 360.0000 mg | ORAL_CAPSULE | Freq: Every day | ORAL | Status: DC

## 2012-03-05 NOTE — Patient Instructions (Signed)
Limit your sodium (Salt) intake    It is important that you exercise regularly, at least 20 minutes 3 to 4 times per week.  If you develop chest pain or shortness of breath seek  medical attention. 

## 2012-03-05 NOTE — Progress Notes (Signed)
  Subjective:    Patient ID: Abigail [name], female    DOB: 01-Oct-1945, 66 y.o.   MRN: [id number]  HPI  66 year old patient history of hypertension cerebral vascular disease and dyslipidemia. She has had a recent GYN evaluation that revealed microscopic hematuria. This has been evaluated by Dr. Aldean Ast in the past. She has no GU symptoms. Her blood pressure remains well-controlled. She states that she has had a recent eye examination. She remains on simvastatin which he continues to tolerate well she has surgical hypothyroidism. She has a history of cerebral vascular disease and denies any focal neurological symptoms. Her main complaints are osteoarthritic.    Review of Systems  Constitutional: Negative.   HENT: Negative for hearing loss, congestion, sore throat, rhinorrhea, dental problem, sinus pressure and tinnitus.   Eyes: Negative for pain, discharge and visual disturbance.  Respiratory: Negative for cough and shortness of breath.   Cardiovascular: Negative for chest pain, palpitations and leg swelling.  Gastrointestinal: Negative for nausea, vomiting, abdominal pain, diarrhea, constipation, blood in stool and abdominal distention.  Genitourinary: Negative for dysuria, urgency, frequency, hematuria, flank pain, vaginal bleeding, vaginal discharge, difficulty urinating, vaginal pain and pelvic pain.  Musculoskeletal: Positive for back pain and arthralgias. Negative for joint swelling and gait problem.  Skin: Negative for rash.  Neurological: Negative for dizziness, syncope, speech difficulty, weakness, numbness and headaches.  Hematological: Negative for adenopathy.  Psychiatric/Behavioral: Negative for behavioral problems, dysphoric mood and agitation. The patient is not nervous/anxious.        Objective:   Physical Exam  Constitutional: She is oriented to person, place, and time. She appears well-developed and well-nourished.  HENT:  Head: Normocephalic.  Right Ear: External ear  normal.  Left Ear: External ear normal.  Mouth/Throat: Oropharynx is clear and moist.  Eyes: Conjunctivae and EOM are normal. Pupils are equal, round, and reactive to light.  Neck: Normal range of motion. Neck supple. No thyromegaly present.  Cardiovascular: Normal rate, regular rhythm, normal heart sounds and intact distal pulses.   Pulmonary/Chest: Effort normal and breath sounds normal.  Abdominal: Soft. Bowel sounds are normal. She exhibits no mass. There is no tenderness.  Musculoskeletal: Normal range of motion.  Lymphadenopathy:    She has no cervical adenopathy.  Neurological: She is alert and oriented to person, place, and time.  Skin: Skin is warm and dry. No rash noted.  Psychiatric: She has a normal mood and affect. Her behavior is normal.          Assessment & Plan:   Hypertension well controlled Dyslipidemia. We'll check a lipid profile today Cerebrovascular disease. Remains asymptomatic continue Plavix Osteoarthritis  Recheck in 6 months

## 2012-03-19 ENCOUNTER — Other Ambulatory Visit: Payer: Self-pay | Admitting: Internal Medicine

## 2012-03-29 ENCOUNTER — Other Ambulatory Visit: Payer: Self-pay | Admitting: Gynecology

## 2012-03-29 DIAGNOSIS — Z1231 Encounter for screening mammogram for malignant neoplasm of breast: Secondary | ICD-10-CM

## 2012-04-18 ENCOUNTER — Ambulatory Visit
Admission: RE | Admit: 2012-04-18 | Discharge: 2012-04-18 | Disposition: A | Discharge status: Home / Self Care | Payer: Medicare Other | Source: Ambulatory Visit | Attending: Gynecology | Admitting: Gynecology

## 2012-04-18 DIAGNOSIS — Z1231 Encounter for screening mammogram for malignant neoplasm of breast: Secondary | ICD-10-CM

## 2012-04-24 ENCOUNTER — Other Ambulatory Visit: Payer: Self-pay | Admitting: Gynecology

## 2012-04-24 DIAGNOSIS — N63 Unspecified lump in unspecified breast: Secondary | ICD-10-CM

## 2012-05-09 ENCOUNTER — Other Ambulatory Visit: Payer: Self-pay | Admitting: Internal Medicine

## 2012-05-10 ENCOUNTER — Ambulatory Visit (INDEPENDENT_AMBULATORY_CARE_PROVIDER_SITE_OTHER): Payer: Medicare Other | Admitting: General Surgery

## 2012-05-10 ENCOUNTER — Encounter (INDEPENDENT_AMBULATORY_CARE_PROVIDER_SITE_OTHER): Payer: Self-pay | Admitting: General Surgery

## 2012-05-10 VITALS — BP 116/76 | HR 64 | Temp 97.6°F | Ht 65.5 in | Wt 197.0 lb

## 2012-05-10 DIAGNOSIS — K432 Incisional hernia without obstruction or gangrene: Secondary | ICD-10-CM

## 2012-05-10 NOTE — Progress Notes (Signed)
Subjective:   lump in the abdomen  Patient ID: Abigail [name], female   DOB: 1946/09/19, 66 y.o.   MRN: [id number]  HPI Patient returns to the office. She has a history of laparoscopic sigmoid colectomy for chronic diverticulitis almost one year ago. She recently has noted a bulge at her umbilicus. This has gotten a little bit larger. It is occasionally tender with pressure. She has not had any nausea or vomiting. She does get a little bit of lower abdominal discomfort after eating somewhat similar to her previous diverticulitis but much less severe. Her bowels are moving okay with just a little bit of constipation. No bleeding.  Past Medical History  Diagnosis Date  . Hyperlipidemia   . History of colonic polyps   . Cervical lymphadenopathy     Right side  . Late effect of adverse effect of drug, medicinal or biological substance   . History of tension headache   . Heart palpitations   . Diverticulitis 09-05-11    hx. gastritis, diverticulitis x2 -now surgery planned  . Patent foramen ovale     Small  . Hypertension 09-05-11    tx. Verapamil  . Stroke 09-05-11    2006/2009-(loss of memory, balance issues remains occ.)  . Sleep apnea 09-05-11    no cpap ever, had surgery to remove cartilage, no problems now  . Hypothyroidism 09-05-11    Supplement used  . Degenerative joint disease of cervical spine 09-05-11    Cervival area, now some osteoarthritis-lower back and Rt. shoulder  . Osteoarthritis 112-17-12    spine and rt. hip, rt. shoulder   Past Surgical History  Procedure Date  . Abdominal hysterectomy   . Thyroidectomy, partial 09-05-11  . Elbow surgery 09-05-11    left elbow -ligament repair  . Umbilical hernia repair 09-05-11  . Tubal ligation   . Colon resection 09/07/2011    Procedure: LAPAROSCOPIC SIGMOID COLON RESECTION;  Surgeon: Mariella Saa, MD;  Location: [facility];  Service: General;  Laterality: N/A;  with proctoscopy  . Laparoscopy 09/07/2011    Procedure:  LAPAROSCOPY DIAGNOSTIC;  Surgeon: Roselle Locus II;  Location: [facility];  Service: Gynecology;  Laterality: N/A;  . Salpingoophorectomy 09/07/2011    Procedure: SALPINGO OOPHERECTOMY;  Surgeon: Roselle Locus II;  Location: [facility];  Service: Gynecology;  Laterality: Right;   Current Outpatient Prescriptions  Medication Sig Dispense Refill  . Aspirin-Caffeine (ANACIN) 400-32 MG TABS Take 2 tablets by mouth every 6 (six) hours as needed. HEADACHE         . butalbital-acetaminophen-caffeine (FIORICET, ESGIC) 50-325-40 MG per tablet TAKE 2 TABLETS BY MOUTH EVERY 6 (SIX) HOURS AS NEEDED FOR HEADACHE.  120 tablet  0  . clopidogrel (PLAVIX) 75 MG tablet Take 1 tablet (75 mg total) by mouth daily.  90 tablet  6  . diclofenac (VOLTAREN) 75 MG EC tablet Take 1 tablet (75 mg total) by mouth 2 (two) times daily.  180 tablet  6  . levothyroxine (SYNTHROID, LEVOTHROID) 125 MCG tablet Take 1 tablet (125 mcg total) by mouth daily.  90 tablet  6  . ranitidine (ZANTAC) 150 MG tablet Take 75 mg by mouth 2 (two) times daily.       . simvastatin (ZOCOR) 40 MG tablet Take 1 tablet (40 mg total) by mouth at bedtime.  90 tablet  6  . traMADol (ULTRAM) 50 MG tablet Take 1 tablet (50 mg total) by mouth every 6 (six) hours as needed. PAIN  90 tablet  6  . verapamil (VERELAN PM) 360 MG 24 hr capsule Take 1 capsule (360 mg total) by mouth at bedtime.  90 capsule  1   Allergies  Allergen Reactions  . Aggrenox (Aspirin-Dipyridamole Er) Nausea And Vomiting and Other (See Comments)    Headache   . Dilaudid (Hydromorphone Hcl) Shortness Of Breath and Nausea And Vomiting  . Shellfish Allergy Shortness Of Breath and Rash  . Codeine Phosphate     REACTION: unspecified  . Hydrocodone Nausea And Vomiting  . Meperidine Hcl Nausea And Vomiting  . Percocet (Oxycodone-Acetaminophen) Nausea And Vomiting  . Cephalexin Itching and Rash     Review of Systems  Constitutional: Positive for fatigue.  Respiratory: Positive for  chest tightness and shortness of breath.   Cardiovascular: Negative for chest pain, palpitations and leg swelling.  Gastrointestinal: Positive for abdominal pain and constipation. Negative for nausea, vomiting, blood in stool and abdominal distention.       Objective:   Physical Exam General: Alert, Mildly obese African American female, in no distress Skin: Warm and dry without rash or infection. HEENT: No palpable masses or thyromegaly. Sclera nonicteric. Pupils equal round and reactive. Oropharynx clear. Lymph nodes: No cervical, supraclavicular, or inguinal nodes palpable. Lungs: Breath sounds clear and equal without increased work of breathing Cardiovascular: Regular rate and rhythm without murmur. No JVD or edema. Peripheral pulses intact. Abdomen: Nondistended. Soft and nontender. No masses palpable. No organomegaly. There is a small to moderate-sized reducible hernia in her small periumbilical incision site. Extremities: No edema or joint swelling or deformity. No chronic venous stasis changes. Neurologic: Alert and fully oriented. Gait normal.     Assessment:     Incisional hernia. This will need to be repaired. I discussed laparoscopic repair with her including the indications and risks of general anesthesia, bleeding, infection, recurrence, and visceral injury. She's having a little bit of lower abdominal discomfort which may be related to her hernia. We are going to go ahead with a CT scan of the abdomen and pelvis to make sure there is no other pathology or recurrent diverticulitis.  The little concerned about her symptoms of chest tightness and shortness of breath. She has had these in the past with an apparently unremarkable cardiac evaluation. I've asked her to see Dr. Amador Cunas for a preoperative visit.    Plan:     Laparoscopic repair of ventral incisional hernia following preoperative CT scan of the abdomen and pelvis and medical evaluation.

## 2012-05-16 ENCOUNTER — Ambulatory Visit
Admission: RE | Admit: 2012-05-16 | Discharge: 2012-05-16 | Disposition: A | Discharge status: Home / Self Care | Payer: Medicare Other | Source: Ambulatory Visit | Attending: General Surgery | Admitting: General Surgery

## 2012-05-16 DIAGNOSIS — K432 Incisional hernia without obstruction or gangrene: Secondary | ICD-10-CM

## 2012-05-16 MED ORDER — IOHEXOL 300 MG/ML  SOLN
100.0000 mL | Freq: Once | INTRAMUSCULAR | Status: AC | PRN
  Administered 2012-05-16: 100 mL via INTRAVENOUS

## 2012-05-17 ENCOUNTER — Encounter: Payer: Self-pay | Admitting: Internal Medicine

## 2012-05-17 ENCOUNTER — Ambulatory Visit (INDEPENDENT_AMBULATORY_CARE_PROVIDER_SITE_OTHER): Payer: Medicare Other | Admitting: Internal Medicine

## 2012-05-17 VITALS — BP 118/80 | HR 64 | Temp 98.2°F | Resp 20 | Ht 65.0 in | Wt 200.0 lb

## 2012-05-17 DIAGNOSIS — I10 Essential (primary) hypertension: Secondary | ICD-10-CM

## 2012-05-17 DIAGNOSIS — Z8679 Personal history of other diseases of the circulatory system: Secondary | ICD-10-CM

## 2012-05-17 DIAGNOSIS — R072 Precordial pain: Secondary | ICD-10-CM

## 2012-05-17 DIAGNOSIS — E785 Hyperlipidemia, unspecified: Secondary | ICD-10-CM

## 2012-05-17 NOTE — Progress Notes (Signed)
Subjective:    Patient ID: Abigail [name], female    DOB: 1945-12-18, 66 y.o.   MRN: [id number]  HPI  66 year old patient who is seen today for preoperative clearance. She is scheduled for elective repair of a ventral hernia next month. She has a long history of a chest pain syndrome that occurs 2-3 times per week these episodes of paroxysmal and can occur at rest with walking use with the change in position. She describes the precordial chest pain as a tightness with radiation to the upper neck area. These are associated with shortness of breath palpitations and a sense of near syncope. She has had cardiology evaluation in the past and she states she has had 2 treadmill exercise tests. Unclear whether these were nuclear tests she has had a 2-D echocardiogram. Twelve-lead EKG has been unremarkable except for a short PR interval.  There has been no change in her symptom complex over the past 3 years. She does exercise regularly on her home treadmill for 30 minutes frequently without any symptoms.  Past Medical History  Diagnosis Date  . Hyperlipidemia   . History of colonic polyps   . Cervical lymphadenopathy     Right side  . Late effect of adverse effect of drug, medicinal or biological substance   . History of tension headache   . Heart palpitations   . Diverticulitis 09-05-11    hx. gastritis, diverticulitis x2 -now surgery planned  . Patent foramen ovale     Small  . Hypertension 09-05-11    tx. Verapamil  . Stroke 09-05-11    2006/2009-(loss of memory, balance issues remains occ.)  . Sleep apnea 09-05-11    no cpap ever, had surgery to remove cartilage, no problems now  . Hypothyroidism 09-05-11    Supplement used  . Degenerative joint disease of cervical spine 09-05-11    Cervival area, now some osteoarthritis-lower back and Rt. shoulder  . Osteoarthritis 112-17-12    spine and rt. hip, rt. shoulder    History   Social History  . Marital Status: Married    Spouse Name: N/A   Number of Children: N/A  . Years of Education: N/A   Occupational History  . Not on file.   Social History Main Topics  . Smoking status: Former Smoker    Quit date: 09/19/1968  . Smokeless tobacco: Never Used  . Alcohol Use: Yes     social -rare  . Drug Use: No  . Sexually Active: Yes   Other Topics Concern  . Not on file   Social History Narrative  . No narrative on file    Past Surgical History  Procedure Date  . Abdominal hysterectomy   . Thyroidectomy, partial 09-05-11  . Elbow surgery 09-05-11    left elbow -ligament repair  . Umbilical hernia repair 09-05-11  . Tubal ligation   . Colon resection 09/07/2011    Procedure: LAPAROSCOPIC SIGMOID COLON RESECTION;  Surgeon: Mariella Saa, MD;  Location: [facility];  Service: General;  Laterality: N/A;  with proctoscopy  . Laparoscopy 09/07/2011    Procedure: LAPAROSCOPY DIAGNOSTIC;  Surgeon: Roselle Locus II;  Location: [facility];  Service: Gynecology;  Laterality: N/A;  . Salpingoophorectomy 09/07/2011    Procedure: SALPINGO OOPHERECTOMY;  Surgeon: Roselle Locus II;  Location: [facility];  Service: Gynecology;  Laterality: Right;    No family history on file.  Allergies  Allergen Reactions  . Aggrenox (Aspirin-Dipyridamole Er) Nausea And Vomiting and Other (See Comments)  Headache   . Dilaudid (Hydromorphone Hcl) Shortness Of Breath and Nausea And Vomiting  . Shellfish Allergy Shortness Of Breath and Rash  . Codeine Phosphate     REACTION: unspecified  . Hydrocodone Nausea And Vomiting  . Meperidine Hcl Nausea And Vomiting  . Percocet (Oxycodone-Acetaminophen) Nausea And Vomiting  . Cephalexin Itching and Rash    Current Outpatient Prescriptions on File Prior to Visit  Medication Sig Dispense Refill  . Aspirin-Caffeine (ANACIN) 400-32 MG TABS Take 2 tablets by mouth every 6 (six) hours as needed. HEADACHE         . butalbital-acetaminophen-caffeine (FIORICET, ESGIC) 50-325-40 MG per tablet TAKE 2 TABLETS  BY MOUTH EVERY 6 (SIX) HOURS AS NEEDED FOR HEADACHE.  120 tablet  0  . clopidogrel (PLAVIX) 75 MG tablet Take 1 tablet (75 mg total) by mouth daily.  90 tablet  6  . diclofenac (VOLTAREN) 75 MG EC tablet Take 1 tablet (75 mg total) by mouth 2 (two) times daily.  180 tablet  6  . levothyroxine (SYNTHROID, LEVOTHROID) 125 MCG tablet Take 1 tablet (125 mcg total) by mouth daily.  90 tablet  6  . ranitidine (ZANTAC) 150 MG tablet Take 75 mg by mouth 2 (two) times daily.       . simvastatin (ZOCOR) 40 MG tablet Take 1 tablet (40 mg total) by mouth at bedtime.  90 tablet  6  . traMADol (ULTRAM) 50 MG tablet Take 1 tablet (50 mg total) by mouth every 6 (six) hours as needed. PAIN  90 tablet  6  . verapamil (VERELAN PM) 360 MG 24 hr capsule Take 1 capsule (360 mg total) by mouth at bedtime.  90 capsule  1    BP 118/80  Pulse 64  Temp 98.2 F (36.8 C) (Oral)  Resp 20  Ht 5\' 5"  (1.651 m)  Wt 200 lb (90.719 kg)  BMI 33.28 kg/m2  SpO2 97%      Review of Systems  Constitutional: Negative.   HENT: Negative for hearing loss, congestion, sore throat, rhinorrhea, dental problem, sinus pressure and tinnitus.   Eyes: Negative for pain, discharge and visual disturbance.  Respiratory: Positive for chest tightness. Negative for cough and shortness of breath.   Cardiovascular: Negative for chest pain, palpitations and leg swelling.  Gastrointestinal: Negative for nausea, vomiting, abdominal pain, diarrhea, constipation, blood in stool and abdominal distention.  Genitourinary: Negative for dysuria, urgency, frequency, hematuria, flank pain, vaginal bleeding, vaginal discharge, difficulty urinating, vaginal pain and pelvic pain.  Musculoskeletal: Negative for joint swelling, arthralgias and gait problem.  Skin: Negative for rash.  Neurological: Negative for dizziness, syncope, speech difficulty, weakness, numbness and headaches.  Hematological: Negative for adenopathy.  Psychiatric/Behavioral: Negative  for behavioral problems, dysphoric mood and agitation. The patient is not nervous/anxious.        Objective:   Physical Exam  Constitutional: She is oriented to person, place, and time. She appears well-developed and well-nourished.  HENT:  Head: Normocephalic.  Right Ear: External ear normal.  Left Ear: External ear normal.  Mouth/Throat: Oropharynx is clear and moist.  Eyes: Conjunctivae and EOM are normal. Pupils are equal, round, and reactive to light.  Neck: Normal range of motion. Neck supple. No thyromegaly present.  Cardiovascular: Normal rate, regular rhythm, normal heart sounds and intact distal pulses.   Pulmonary/Chest: Effort normal and breath sounds normal.  Abdominal: Soft. Bowel sounds are normal.  Musculoskeletal: Normal range of motion.  Lymphadenopathy:    She has no cervical adenopathy.  Neurological:  She is alert and oriented to person, place, and time.  Skin: Skin is warm and dry. No rash noted.  Psychiatric: She has a normal mood and affect. Her behavior is normal.          Assessment & Plan:   Long history of chest pain syndrome in a patient with multiple cardiovascular risk factors. Patient has seen cardiology in the past and we'll obtain a cardiology preoperative appointment Hypertension stable Dyslipidemia History of cerebrovascular disease

## 2012-05-17 NOTE — Patient Instructions (Signed)
Cardiology followup as discussed  Limit your sodium (Salt) intake    It is important that you exercise regularly, at least 20 minutes 3 to 4 times per week.  If you develop chest pain or shortness of breath seek  medical attention.  Return in 4 months for follow-up

## 2012-05-23 ENCOUNTER — Telehealth (INDEPENDENT_AMBULATORY_CARE_PROVIDER_SITE_OTHER): Payer: Self-pay

## 2012-05-23 NOTE — Telephone Encounter (Signed)
Message copied by Maryan Puls on Wed May 23, 2012  4:23 PM ------      Message from: Glenna Fellows T      Created: Tue May 22, 2012 10:15 AM       Please call patient "CT showed hernia as expected, colon looks fine without diverticulitis"

## 2012-05-23 NOTE — Telephone Encounter (Signed)
Called and left message for patient to call our office re: CT results "CT showed hernia as expected, colon looks fine without diverticulitis"

## 2012-05-25 ENCOUNTER — Telehealth (INDEPENDENT_AMBULATORY_CARE_PROVIDER_SITE_OTHER): Payer: Self-pay

## 2012-05-25 NOTE — Telephone Encounter (Signed)
Return call to patient re: CT results, left voice message on home and mobile phone numbers.  Will try to contact patient Monday 05/28/12.

## 2012-05-29 ENCOUNTER — Telehealth (INDEPENDENT_AMBULATORY_CARE_PROVIDER_SITE_OTHER): Payer: Self-pay

## 2012-05-29 NOTE — Telephone Encounter (Signed)
Patient given CT results

## 2012-05-29 NOTE — Telephone Encounter (Signed)
Message copied by Maryan Puls on Tue May 29, 2012  3:38 PM ------      Message from: Glenna Fellows T      Created: Tue May 22, 2012 10:15 AM       Please call patient "CT showed hernia as expected, colon looks fine without diverticulitis"

## 2012-05-30 ENCOUNTER — Ambulatory Visit (INDEPENDENT_AMBULATORY_CARE_PROVIDER_SITE_OTHER): Payer: Medicare Other | Admitting: Cardiology

## 2012-05-30 ENCOUNTER — Encounter: Payer: Self-pay | Admitting: Cardiology

## 2012-05-30 VITALS — BP 124/78 | HR 56 | Ht 65.5 in | Wt 200.0 lb

## 2012-05-30 DIAGNOSIS — Z01810 Encounter for preprocedural cardiovascular examination: Secondary | ICD-10-CM | POA: Insufficient documentation

## 2012-05-30 DIAGNOSIS — I10 Essential (primary) hypertension: Secondary | ICD-10-CM

## 2012-05-30 DIAGNOSIS — R002 Palpitations: Secondary | ICD-10-CM

## 2012-05-30 DIAGNOSIS — R079 Chest pain, unspecified: Secondary | ICD-10-CM

## 2012-05-30 NOTE — Patient Instructions (Addendum)
Your physician recommends that you schedule a follow-up appointment in: as needed  

## 2012-05-30 NOTE — Assessment & Plan Note (Signed)
Blood pressure controlled. Continue present medications. 

## 2012-05-30 NOTE — Assessment & Plan Note (Signed)
Patient presents for preoperative evaluation prior to ventral hernia surgery. She has not had chest pain. Cardiac catheterization approximately 2 years ago showed essentially normal coronaries. No plans for further evaluation prior to surgery.

## 2012-05-30 NOTE — Assessment & Plan Note (Signed)
Patient has long-standing brief palpitations associated with dizziness. She is worn monitors previously. We will not pursue further evaluation at this time but can consider repeat monitor in the future if her symptoms worsen.

## 2012-05-30 NOTE — Progress Notes (Signed)
HPI: 66 year old female for preoperative evaluation prior to repair of ventral hernia. She has a long history of palpitations and presyncope. Patient apparently noted to have a PFO on transesophageal echocardiogram in 2006 following CVA. Cardiac catheterization performed in September of 2011 showed a normal left main, minimal irregularities in the mid LAD, normal circumflex and normal right coronary artery. Ejection fraction was 55%. No evidence of abdominal aortic aneurysm. Echocardiogram in September 2011 showed normal LV function, grade 1 diastolic dysfunction, mildly dilated descending aorta and trace aortic insufficiency. Event monitor in 2011 for palpitations showed sinus with PACs. The patient has mild dyspnea on exertion but denies orthopnea, PND, pedal edema. She does not have exertional chest pain. She does have "spells".  These are long-standing for several years. She has worn monitors previously. She feels 2-3 seconds of palpitations associated with dizziness and dyspnea. No chest pain. Her symptoms resolved spontaneously. They occur more commonly with standing suddenly or when she is "hot". She does not have syncope.  Current Outpatient Prescriptions  Medication Sig Dispense Refill  . Aspirin-Caffeine (ANACIN) 400-32 MG TABS Take 2 tablets by mouth every 6 (six) hours as needed. HEADACHE         . butalbital-acetaminophen-caffeine (FIORICET, ESGIC) 50-325-40 MG per tablet TAKE 2 TABLETS BY MOUTH EVERY 6 (SIX) HOURS AS NEEDED FOR HEADACHE.  120 tablet  0  . clopidogrel (PLAVIX) 75 MG tablet Take 1 tablet (75 mg total) by mouth daily.  90 tablet  6  . diclofenac (VOLTAREN) 75 MG EC tablet Take 1 tablet (75 mg total) by mouth 2 (two) times daily.  180 tablet  6  . levothyroxine (SYNTHROID, LEVOTHROID) 125 MCG tablet Take 1 tablet (125 mcg total) by mouth daily.  90 tablet  6  . ranitidine (ZANTAC) 150 MG tablet Take 75 mg by mouth 2 (two) times daily.       . simvastatin (ZOCOR) 40 MG  tablet Take 1 tablet (40 mg total) by mouth at bedtime.  90 tablet  6  . traMADol (ULTRAM) 50 MG tablet Take 1 tablet (50 mg total) by mouth every 6 (six) hours as needed. PAIN  90 tablet  6  . verapamil (VERELAN PM) 360 MG 24 hr capsule Take 1 capsule (360 mg total) by mouth at bedtime.  90 capsule  1     Past Medical History  Diagnosis Date  . Hyperlipidemia   . History of colonic polyps   . Cervical lymphadenopathy     Right side  . Late effect of adverse effect of drug, medicinal or biological substance   . History of tension headache   . Heart palpitations   . Diverticulitis 09-05-11    hx. gastritis, diverticulitis x2 -now surgery planned  . Patent foramen ovale     Small  . Hypertension 09-05-11    tx. Verapamil  . Stroke 09-05-11    2006/2009-(loss of memory, balance issues remains occ.)  . Sleep apnea 09-05-11    no cpap ever, had surgery to remove cartilage, no problems now  . Hypothyroidism 09-05-11    Supplement used  . Degenerative joint disease of cervical spine 09-05-11    Cervival area, now some osteoarthritis-lower back and Rt. shoulder  . Osteoarthritis 112-17-12    spine and rt. hip, rt. shoulder    Past Surgical History  Procedure Date  . Abdominal hysterectomy   . Thyroidectomy, partial 09-05-11  . Elbow surgery 09-05-11    left elbow -ligament repair  . Umbilical hernia repair  09-05-11  . Tubal ligation   . Colon resection 09/07/2011    Procedure: LAPAROSCOPIC SIGMOID COLON RESECTION;  Surgeon: Mariella Saa, MD;  Location: [facility];  Service: General;  Laterality: N/A;  with proctoscopy  . Laparoscopy 09/07/2011    Procedure: LAPAROSCOPY DIAGNOSTIC;  Surgeon: Roselle Locus II;  Location: [facility];  Service: Gynecology;  Laterality: N/A;  . Salpingoophorectomy 09/07/2011    Procedure: SALPINGO OOPHERECTOMY;  Surgeon: Roselle Locus II;  Location: [facility];  Service: Gynecology;  Laterality: Right;    History   Social History  . Marital  Status: Married    Spouse Name: N/A    Number of Children: N/A  . Years of Education: N/A   Occupational History  . Not on file.   Social History Main Topics  . Smoking status: Former Smoker    Quit date: 09/19/1968  . Smokeless tobacco: Never Used  . Alcohol Use: Yes     social -rare  . Drug Use: No  . Sexually Active: Yes   Other Topics Concern  . Not on file   Social History Narrative  . No narrative on file    ROS: no fevers or chills, productive cough, hemoptysis, dysphasia, odynophagia, melena, hematochezia, dysuria, hematuria, rash, seizure activity, orthopnea, PND, pedal edema, claudication. Remaining systems are negative.  Physical Exam: Well-developed well-nourished in no acute distress.  Skin is warm and dry.  HEENT is normal.  Neck is supple.  Chest is clear to auscultation with normal expansion.  Cardiovascular exam is regular rate and rhythm.  Abdominal exam nontender or distended. No masses palpated. Extremities show no edema. neuro grossly intact  ECG sinus bradycardia at a rate of 56. Left ventricular hypertrophy. Nonspecific ST changes.

## 2012-06-01 ENCOUNTER — Other Ambulatory Visit: Payer: Self-pay | Admitting: Internal Medicine

## 2012-06-01 ENCOUNTER — Institutional Professional Consult (permissible substitution): Payer: Medicare Other | Admitting: Cardiology

## 2012-06-07 ENCOUNTER — Encounter (HOSPITAL_COMMUNITY): Payer: Self-pay | Admitting: Pharmacy Technician

## 2012-06-07 ENCOUNTER — Telehealth (INDEPENDENT_AMBULATORY_CARE_PROVIDER_SITE_OTHER): Payer: Self-pay | Admitting: General Surgery

## 2012-06-07 NOTE — Telephone Encounter (Signed)
Cold patient and answered questions she had about surgery and use of mesh

## 2012-06-14 ENCOUNTER — Encounter (HOSPITAL_COMMUNITY)
Admission: RE | Admit: 2012-06-14 | Discharge: 2012-06-14 | Disposition: A | Discharge status: Home / Self Care | Payer: Medicare Other | Source: Ambulatory Visit | Attending: General Surgery | Admitting: General Surgery

## 2012-06-14 ENCOUNTER — Telehealth (INDEPENDENT_AMBULATORY_CARE_PROVIDER_SITE_OTHER): Payer: Self-pay | Admitting: General Surgery

## 2012-06-14 ENCOUNTER — Encounter (HOSPITAL_COMMUNITY): Payer: Self-pay

## 2012-06-14 HISTORY — DX: Other specified postprocedural states: R11.2

## 2012-06-14 HISTORY — DX: Other specified postprocedural states: Z98.890

## 2012-06-14 LAB — BASIC METABOLIC PANEL
BUN: 17 mg/dL (ref 6–23)
CO2: 24 mEq/L (ref 19–32)
Chloride: 106 mEq/L (ref 96–112)
Creatinine, Ser: 0.73 mg/dL (ref 0.50–1.10)

## 2012-06-14 LAB — CBC
HCT: 39.7 % (ref 36.0–46.0)
MCH: 30.2 pg (ref 26.0–34.0)
MCV: 89.6 fL (ref 78.0–100.0)
RBC: 4.43 MIL/uL (ref 3.87–5.11)
WBC: 5.5 10*3/uL (ref 4.0–10.5)

## 2012-06-14 NOTE — Pre-Procedure Instructions (Addendum)
09-05-2011 chest 2 view xray epic lov note/ cardiac clearance dr Jens Som 05-30-2012 epic ekg 05-30-2012 epic

## 2012-06-14 NOTE — Telephone Encounter (Signed)
Pt called to ask Dr. Johna Sheriff to order (subq) heparin shots in her stomach after her surgery.  She is on Plavix for history of stroke.  Stated this is something that can be ordered on her DOS if needed, but will let MD know she called with this request.

## 2012-06-14 NOTE — Patient Instructions (Signed)
20 Abigail [name]  06/14/2012   Your procedure is scheduled on:  06-20-2012  Report to [facility] at  0730 AM.  Call this number if you have problems the morning of surgery: [phone]   Remember:   Do not eat food or drink liquids:After Midnight.  .  Take these medicines the morning of surgery with A SIP OF WATER: levothryoxine, ranitidine, eye drop if needed   Do not wear jewelry or make up.  Do not wear lotions, powders, or perfumes. You may wear deodorant.    Do not bring valuables to the hospital.  Contacts, dentures or bridgework may not be worn into surgery.  Leave suitcase in the car. After surgery it may be brought to your room.  For patients admitted to the hospital, checkout time is 11:00 AM the day of discharge                             Patients discharged the day of surgery will not be allowed to drive home. If going home same day of surgery, you must have someone stay with you the first 24 hours at home and arrange for some one to drive you home from hospital.    Special Instructions: See [facility] Preparing for Surgery instruction sheet. Women do not shave legs or underarms for 12 hours before showers. Men may shave face morning of surgery.    Please read over the following fact sheets that you were given: MRSA Information  Cain Sieve [facility] pre op nurse phone number [phone], call if needed

## 2012-06-15 ENCOUNTER — Other Ambulatory Visit: Payer: Self-pay

## 2012-06-15 ENCOUNTER — Other Ambulatory Visit: Payer: Self-pay | Admitting: Internal Medicine

## 2012-06-15 MED ORDER — BUTALBITAL-APAP-CAFFEINE 50-325-40 MG PO TABS: 1.0000 | ORAL_TABLET | Freq: Four times a day (QID) | ORAL | Status: DC | PRN

## 2012-06-15 NOTE — Telephone Encounter (Signed)
Last written 05/09/12 # [id number] Rf Please advise

## 2012-06-15 NOTE — Telephone Encounter (Signed)
ok 

## 2012-06-20 ENCOUNTER — Encounter (HOSPITAL_COMMUNITY): Payer: Self-pay | Admitting: *Deleted

## 2012-06-20 ENCOUNTER — Encounter (HOSPITAL_COMMUNITY): Payer: Self-pay | Admitting: Anesthesiology

## 2012-06-20 ENCOUNTER — Encounter (HOSPITAL_COMMUNITY)
Admission: RE | Disposition: A | Discharge status: Home / Self Care | Payer: Self-pay | Source: Ambulatory Visit | Attending: General Surgery

## 2012-06-20 ENCOUNTER — Inpatient Hospital Stay (HOSPITAL_COMMUNITY)
Admission: RE | Admit: 2012-06-20 | Discharge: 2012-06-22 | DRG: 354 | Disposition: A | Discharge status: Home / Self Care | Payer: Medicare Other | Source: Ambulatory Visit | Attending: General Surgery | Admitting: General Surgery

## 2012-06-20 ENCOUNTER — Ambulatory Visit (HOSPITAL_COMMUNITY): Payer: Medicare Other | Admitting: Anesthesiology

## 2012-06-20 DIAGNOSIS — E669 Obesity, unspecified: Secondary | ICD-10-CM | POA: Diagnosis present

## 2012-06-20 DIAGNOSIS — Z888 Allergy status to other drugs, medicaments and biological substances status: Secondary | ICD-10-CM

## 2012-06-20 DIAGNOSIS — K432 Incisional hernia without obstruction or gangrene: Secondary | ICD-10-CM

## 2012-06-20 DIAGNOSIS — Z860100 Personal history of colon polyps, unspecified: Secondary | ICD-10-CM

## 2012-06-20 DIAGNOSIS — Q2111 Secundum atrial septal defect: Secondary | ICD-10-CM

## 2012-06-20 DIAGNOSIS — G4730 Sleep apnea, unspecified: Secondary | ICD-10-CM | POA: Diagnosis present

## 2012-06-20 DIAGNOSIS — Z90710 Acquired absence of both cervix and uterus: Secondary | ICD-10-CM

## 2012-06-20 DIAGNOSIS — Z9089 Acquired absence of other organs: Secondary | ICD-10-CM

## 2012-06-20 DIAGNOSIS — Z6832 Body mass index (BMI) 32.0-32.9, adult: Secondary | ICD-10-CM

## 2012-06-20 DIAGNOSIS — K439 Ventral hernia without obstruction or gangrene: Secondary | ICD-10-CM

## 2012-06-20 DIAGNOSIS — Z9079 Acquired absence of other genital organ(s): Secondary | ICD-10-CM

## 2012-06-20 DIAGNOSIS — Z885 Allergy status to narcotic agent status: Secondary | ICD-10-CM

## 2012-06-20 DIAGNOSIS — I69998 Other sequelae following unspecified cerebrovascular disease: Secondary | ICD-10-CM

## 2012-06-20 DIAGNOSIS — Z79899 Other long term (current) drug therapy: Secondary | ICD-10-CM

## 2012-06-20 DIAGNOSIS — M479 Spondylosis, unspecified: Secondary | ICD-10-CM | POA: Diagnosis present

## 2012-06-20 DIAGNOSIS — I10 Essential (primary) hypertension: Secondary | ICD-10-CM | POA: Diagnosis present

## 2012-06-20 DIAGNOSIS — Z886 Allergy status to analgesic agent status: Secondary | ICD-10-CM

## 2012-06-20 DIAGNOSIS — Q211 Atrial septal defect: Secondary | ICD-10-CM

## 2012-06-20 DIAGNOSIS — Z8719 Personal history of other diseases of the digestive system: Secondary | ICD-10-CM

## 2012-06-20 DIAGNOSIS — Z7982 Long term (current) use of aspirin: Secondary | ICD-10-CM

## 2012-06-20 DIAGNOSIS — M159 Polyosteoarthritis, unspecified: Secondary | ICD-10-CM | POA: Diagnosis present

## 2012-06-20 DIAGNOSIS — E890 Postprocedural hypothyroidism: Secondary | ICD-10-CM | POA: Diagnosis present

## 2012-06-20 DIAGNOSIS — E785 Hyperlipidemia, unspecified: Secondary | ICD-10-CM | POA: Diagnosis present

## 2012-06-20 DIAGNOSIS — Z9049 Acquired absence of other specified parts of digestive tract: Secondary | ICD-10-CM

## 2012-06-20 DIAGNOSIS — M47812 Spondylosis without myelopathy or radiculopathy, cervical region: Secondary | ICD-10-CM | POA: Diagnosis present

## 2012-06-20 HISTORY — PX: VENTRAL HERNIA REPAIR: SHX424

## 2012-06-20 SURGERY — REPAIR, HERNIA, VENTRAL, LAPAROSCOPIC
Anesthesia: General | Wound class: Clean

## 2012-06-20 MED ORDER — SCOPOLAMINE 1 MG/3DAYS TD PT72
1.0000 | MEDICATED_PATCH | TRANSDERMAL | Status: DC
  Administered 2012-06-20: 1 via TRANSDERMAL
  Filled 2012-06-20: qty 1

## 2012-06-20 MED ORDER — NEOSTIGMINE METHYLSULFATE 1 MG/ML IJ SOLN
INTRAMUSCULAR | Status: DC | PRN
  Administered 2012-06-20: .5 mg via INTRAVENOUS

## 2012-06-20 MED ORDER — HYDROMORPHONE 0.3 MG/ML IV SOLN
INTRAVENOUS | Status: AC
  Filled 2012-06-20: qty 25

## 2012-06-20 MED ORDER — BACITRACIN-NEOMYCIN-POLYMYXIN 400-5-5000 EX OINT
TOPICAL_OINTMENT | Freq: Two times a day (BID) | CUTANEOUS | Status: DC
  Administered 2012-06-20 – 2012-06-21 (×2): 1 via TOPICAL

## 2012-06-20 MED ORDER — ONDANSETRON HCL 4 MG/2ML IJ SOLN
INTRAMUSCULAR | Status: DC | PRN
  Administered 2012-06-20: 4 mg via INTRAVENOUS

## 2012-06-20 MED ORDER — SUCCINYLCHOLINE CHLORIDE 20 MG/ML IJ SOLN
INTRAMUSCULAR | Status: DC | PRN
  Administered 2012-06-20: 100 mg via INTRAVENOUS

## 2012-06-20 MED ORDER — CISATRACURIUM BESYLATE (PF) 10 MG/5ML IV SOLN
INTRAVENOUS | Status: DC | PRN
  Administered 2012-06-20: 6 mg via INTRAVENOUS
  Administered 2012-06-20 (×2): 4 mg via INTRAVENOUS

## 2012-06-20 MED ORDER — DEXTROSE IN LACTATED RINGERS 5 % IV SOLN
INTRAVENOUS | Status: DC
  Administered 2012-06-20 – 2012-06-21 (×3): via INTRAVENOUS

## 2012-06-20 MED ORDER — BUPIVACAINE-EPINEPHRINE 0.25% -1:200000 IJ SOLN
INTRAMUSCULAR | Status: DC | PRN
  Administered 2012-06-20: 19 mL

## 2012-06-20 MED ORDER — PROMETHAZINE HCL 25 MG/ML IJ SOLN: 12.5000 mg | Freq: Four times a day (QID) | INTRAMUSCULAR | Status: DC | PRN

## 2012-06-20 MED ORDER — ONDANSETRON HCL 4 MG PO TABS: 4.0000 mg | ORAL_TABLET | Freq: Four times a day (QID) | ORAL | Status: DC | PRN

## 2012-06-20 MED ORDER — VERAPAMIL HCL ER 120 MG PO TBCR
360.0000 mg | EXTENDED_RELEASE_TABLET | Freq: Every day | ORAL | Status: DC
  Administered 2012-06-20 – 2012-06-21 (×2): 360 mg via ORAL
  Filled 2012-06-20 (×4): qty 3

## 2012-06-20 MED ORDER — ACETAMINOPHEN 10 MG/ML IV SOLN
INTRAVENOUS | Status: DC | PRN
  Administered 2012-06-20: 1000 mg via INTRAVENOUS

## 2012-06-20 MED ORDER — ALBUTEROL SULFATE (5 MG/ML) 0.5% IN NEBU
INHALATION_SOLUTION | RESPIRATORY_TRACT | Status: AC
  Filled 2012-06-20: qty 0.5

## 2012-06-20 MED ORDER — HEPARIN SODIUM (PORCINE) 5000 UNIT/ML IJ SOLN
5000.0000 [IU] | Freq: Three times a day (TID) | INTRAMUSCULAR | Status: DC
  Administered 2012-06-20 – 2012-06-22 (×5): 5000 [IU] via SUBCUTANEOUS
  Filled 2012-06-20 (×9): qty 1

## 2012-06-20 MED ORDER — FENTANYL CITRATE 0.05 MG/ML IJ SOLN
INTRAMUSCULAR | Status: DC | PRN
  Administered 2012-06-20 (×3): 50 ug via INTRAVENOUS
  Administered 2012-06-20: 100 ug via INTRAVENOUS

## 2012-06-20 MED ORDER — KETOROLAC TROMETHAMINE 30 MG/ML IJ SOLN: 15.0000 mg | Freq: Once | INTRAMUSCULAR | Status: DC | PRN

## 2012-06-20 MED ORDER — CIPROFLOXACIN IN D5W 400 MG/200ML IV SOLN
400.0000 mg | INTRAVENOUS | Status: AC
  Administered 2012-06-20: 400 mg via INTRAVENOUS

## 2012-06-20 MED ORDER — KETOROLAC TROMETHAMINE 30 MG/ML IJ SOLN
INTRAMUSCULAR | Status: AC
  Filled 2012-06-20: qty 1

## 2012-06-20 MED ORDER — FENTANYL CITRATE 0.05 MG/ML IJ SOLN
25.0000 ug | INTRAMUSCULAR | Status: DC | PRN
  Administered 2012-06-20 (×3): 50 ug via INTRAVENOUS

## 2012-06-20 MED ORDER — ONDANSETRON HCL 4 MG/2ML IJ SOLN
4.0000 mg | Freq: Four times a day (QID) | INTRAMUSCULAR | Status: DC | PRN
  Administered 2012-06-20 – 2012-06-22 (×3): 4 mg via INTRAVENOUS
  Filled 2012-06-20 (×3): qty 2

## 2012-06-20 MED ORDER — EPHEDRINE SULFATE 50 MG/ML IJ SOLN
INTRAMUSCULAR | Status: DC | PRN
  Administered 2012-06-20: 5 mg via INTRAVENOUS

## 2012-06-20 MED ORDER — GLYCOPYRROLATE 0.2 MG/ML IJ SOLN
INTRAMUSCULAR | Status: DC | PRN
  Administered 2012-06-20: .5 mg via INTRAVENOUS

## 2012-06-20 MED ORDER — FENTANYL CITRATE 0.05 MG/ML IJ SOLN
INTRAMUSCULAR | Status: AC
  Filled 2012-06-20: qty 2

## 2012-06-20 MED ORDER — LEVOTHYROXINE SODIUM 125 MCG PO TABS
125.0000 ug | ORAL_TABLET | Freq: Every day | ORAL | Status: DC
  Administered 2012-06-21 – 2012-06-22 (×2): 125 ug via ORAL
  Filled 2012-06-20 (×4): qty 1

## 2012-06-20 MED ORDER — SCOPOLAMINE 1 MG/3DAYS TD PT72
MEDICATED_PATCH | TRANSDERMAL | Status: AC
  Filled 2012-06-20: qty 1

## 2012-06-20 MED ORDER — TRAMADOL HCL 50 MG PO TABS
50.0000 mg | ORAL_TABLET | Freq: Four times a day (QID) | ORAL | Status: DC | PRN
  Administered 2012-06-21: 50 mg via ORAL
  Filled 2012-06-20: qty 1

## 2012-06-20 MED ORDER — BUPIVACAINE-EPINEPHRINE PF 0.25-1:200000 % IJ SOLN
INTRAMUSCULAR | Status: AC
  Filled 2012-06-20: qty 30

## 2012-06-20 MED ORDER — SIMVASTATIN 40 MG PO TABS
40.0000 mg | ORAL_TABLET | Freq: Every day | ORAL | Status: DC
  Administered 2012-06-20: 40 mg via ORAL
  Filled 2012-06-20 (×3): qty 1

## 2012-06-20 MED ORDER — LIDOCAINE HCL (CARDIAC) 20 MG/ML IV SOLN
INTRAVENOUS | Status: DC | PRN
  Administered 2012-06-20: 8 mg via INTRAVENOUS

## 2012-06-20 MED ORDER — RACEPINEPHRINE HCL 2.25 % IN NEBU
0.5000 mL | INHALATION_SOLUTION | Freq: Once | RESPIRATORY_TRACT | Status: AC
  Administered 2012-06-20: 0.5 mL via RESPIRATORY_TRACT
  Filled 2012-06-20: qty 0.5

## 2012-06-20 MED ORDER — PROPOFOL 10 MG/ML IV BOLUS
INTRAVENOUS | Status: DC | PRN
  Administered 2012-06-20: 170 mg via INTRAVENOUS

## 2012-06-20 MED ORDER — MORPHINE SULFATE 2 MG/ML IJ SOLN
2.0000 mg | INTRAMUSCULAR | Status: DC | PRN
  Administered 2012-06-20: 2 mg via INTRAVENOUS
  Administered 2012-06-20: 4 mg via INTRAVENOUS
  Administered 2012-06-21: 2 mg via INTRAVENOUS
  Administered 2012-06-21 (×2): 4 mg via INTRAVENOUS
  Administered 2012-06-22: 2 mg via INTRAVENOUS
  Filled 2012-06-20: qty 2
  Filled 2012-06-20 (×2): qty 1
  Filled 2012-06-20 (×2): qty 2
  Filled 2012-06-20 (×2): qty 1

## 2012-06-20 MED ORDER — LACTATED RINGERS IV SOLN
INTRAVENOUS | Status: DC
  Administered 2012-06-20: 1000 mL via INTRAVENOUS
  Administered 2012-06-20: 11:00:00 via INTRAVENOUS

## 2012-06-20 MED ORDER — CIPROFLOXACIN IN D5W 400 MG/200ML IV SOLN
INTRAVENOUS | Status: AC
  Filled 2012-06-20: qty 200

## 2012-06-20 MED ORDER — BUTALBITAL-APAP-CAFFEINE 50-325-40 MG PO TABS
1.0000 | ORAL_TABLET | Freq: Four times a day (QID) | ORAL | Status: DC | PRN
  Administered 2012-06-20 – 2012-06-22 (×3): 1 via ORAL
  Filled 2012-06-20 (×3): qty 1

## 2012-06-20 MED ORDER — PROMETHAZINE HCL 25 MG/ML IJ SOLN: 6.2500 mg | INTRAMUSCULAR | Status: DC | PRN

## 2012-06-20 SURGICAL SUPPLY — 56 items
ADH SKN CLS APL DERMABOND .7 (GAUZE/BANDAGES/DRESSINGS) ×1
APL SKNCLS STERI-STRIP NONHPOA (GAUZE/BANDAGES/DRESSINGS)
APPLIER CLIP 5 13 M/L LIGAMAX5 (MISCELLANEOUS)
APR CLP MED LRG 5 ANG JAW (MISCELLANEOUS)
BENZOIN TINCTURE PRP APPL 2/3 (GAUZE/BANDAGES/DRESSINGS) IMPLANT
BINDER ABD UNIV 12 45-62 (WOUND CARE) IMPLANT
BINDER ABDOMINAL 46IN 62IN (WOUND CARE)
CANISTER SUCTION 2500CC (MISCELLANEOUS) ×2 IMPLANT
CHLORAPREP W/TINT 26ML (MISCELLANEOUS) ×3 IMPLANT
CLIP APPLIE 5 13 M/L LIGAMAX5 (MISCELLANEOUS) IMPLANT
CLOTH BEACON ORANGE TIMEOUT ST (SAFETY) ×2 IMPLANT
COVER SURGICAL LIGHT HANDLE (MISCELLANEOUS) ×1 IMPLANT
DECANTER SPIKE VIAL GLASS SM (MISCELLANEOUS) ×2 IMPLANT
DERMABOND ADVANCED (GAUZE/BANDAGES/DRESSINGS) ×1
DERMABOND ADVANCED .7 DNX12 (GAUZE/BANDAGES/DRESSINGS) IMPLANT
DEVICE SECURE STRAP 25 ABSORB (INSTRUMENTS) ×1 IMPLANT
DEVICE TROCAR PUNCTURE CLOSURE (ENDOMECHANICALS) ×1 IMPLANT
DISSECTOR BLUNT TIP ENDO 5MM (MISCELLANEOUS) IMPLANT
DRAPE LAPAROSCOPIC ABDOMINAL (DRAPES) ×2 IMPLANT
DRAPE UTILITY XL STRL (DRAPES) ×2 IMPLANT
ELECT REM PT RETURN 9FT ADLT (ELECTROSURGICAL) ×2
ELECTRODE REM PT RTRN 9FT ADLT (ELECTROSURGICAL) ×1 IMPLANT
GLOVE BIOGEL PI IND STRL 7.0 (GLOVE) ×1 IMPLANT
GLOVE BIOGEL PI INDICATOR 7.0 (GLOVE) ×8
GLOVE SURG SS PI 7.5 STRL IVOR (GLOVE) ×2 IMPLANT
GOWN STRL NON-REIN LRG LVL3 (GOWN DISPOSABLE) ×2 IMPLANT
GOWN STRL REIN XL XLG (GOWN DISPOSABLE) ×5 IMPLANT
KIT BASIN OR (CUSTOM PROCEDURE TRAY) ×2 IMPLANT
MESH VENTRALIGHT ST 6IN (Mesh General) ×1 IMPLANT
NDL SPNL 22GX3.5 QUINCKE BK (NEEDLE) ×1 IMPLANT
NEEDLE SPNL 22GX3.5 QUINCKE BK (NEEDLE) ×2 IMPLANT
NS IRRIG 1000ML POUR BTL (IV SOLUTION) ×2 IMPLANT
PEN SKIN MARKING BROAD (MISCELLANEOUS) ×1 IMPLANT
PENCIL BUTTON HOLSTER BLD 10FT (ELECTRODE) IMPLANT
SCALPEL HARMONIC ACE (MISCELLANEOUS) IMPLANT
SCISSORS LAP 5X35 DISP (ENDOMECHANICALS) ×1 IMPLANT
SET IRRIG TUBING LAPAROSCOPIC (IRRIGATION / IRRIGATOR) IMPLANT
SLEEVE ADV FIXATION 5X100MM (TROCAR) IMPLANT
SLEEVE Z-THREAD 5X100MM (TROCAR) IMPLANT
SOLUTION ANTI FOG 6CC (MISCELLANEOUS) ×2 IMPLANT
STRIP CLOSURE SKIN 1/2X4 (GAUZE/BANDAGES/DRESSINGS) IMPLANT
SUT MNCRL AB 4-0 PS2 18 (SUTURE) ×2 IMPLANT
SUT NOVA NAB GS-21 0 18 T12 DT (SUTURE) ×1 IMPLANT
SUT PROLENE 0 CT 1 CR/8 (SUTURE) ×1 IMPLANT
TACKER 5MM HERNIA 3.5CML NAB (ENDOMECHANICALS) ×1 IMPLANT
TOWEL OR 17X26 10 PK STRL BLUE (TOWEL DISPOSABLE) ×2 IMPLANT
TRAY FOLEY CATH 14FRSI W/METER (CATHETERS) ×1 IMPLANT
TRAY LAP CHOLE (CUSTOM PROCEDURE TRAY) ×2 IMPLANT
TROCAR ADV FIXATION 11X100MM (TROCAR) IMPLANT
TROCAR ADV FIXATION 5X100MM (TROCAR) ×1 IMPLANT
TROCAR BLADELESS OPT 5 75 (ENDOMECHANICALS) ×1 IMPLANT
TROCAR XCEL NON-BLD 11X100MML (ENDOMECHANICALS) IMPLANT
TROCAR Z-THREAD FIOS 11X100 BL (TROCAR) ×1 IMPLANT
TROCAR Z-THREAD FIOS 5X100MM (TROCAR) ×1 IMPLANT
TROCAR Z-THREAD SLEEVE 11X100 (TROCAR) IMPLANT
TUBING INSUFFLATION 10FT LAP (TUBING) ×2 IMPLANT

## 2012-06-20 NOTE — Anesthesia Procedure Notes (Signed)
Procedure Name: Intubation Date/Time: 06/20/2012 10:12 AM Performed by: Hulan Fess Pre-anesthesia Checklist: Patient identified, Emergency Drugs available, Suction available, Patient being monitored and Timeout performed Patient Re-evaluated:Patient Re-evaluated prior to inductionOxygen Delivery Method: Circle system utilized and Simple face mask Preoxygenation: Pre-oxygenation with 100% oxygen Intubation Type: IV induction Ventilation: Mask ventilation without difficulty Laryngoscope Size: Mac and 3 Grade View: Grade I Tube type: Oral Tube size: 8.0 mm Number of attempts: 1 Placement Confirmation: ETT inserted through vocal cords under direct vision Secured at: 22 cm Tube secured with: Tape Dental Injury: Teeth and Oropharynx as per pre-operative assessment

## 2012-06-20 NOTE — H&P (Signed)
Subjective:   lump in the abdomen  Patient ID: Abigail [name], female DOB: Jun 05, 1946, 66 y.o. MRN: [id number]  HPI  Patient returns to the office. She has a history of laparoscopic sigmoid colectomy for chronic diverticulitis almost one year ago. She recently has noted a bulge at her umbilicus. This has gotten a little bit larger. It is occasionally tender with pressure. She has not had any nausea or vomiting. She does get a little bit of lower abdominal discomfort after eating somewhat similar to her previous diverticulitis but much less severe. Her bowels are moving okay with just a little bit of constipation. No bleeding.  Past Medical History   Diagnosis  Date   .  Hyperlipidemia    .  History of colonic polyps    .  Cervical lymphadenopathy      Right side   .  Late effect of adverse effect of drug, medicinal or biological substance    .  History of tension headache    .  Heart palpitations    .  Diverticulitis  09-05-11     hx. gastritis, diverticulitis x2 -now surgery planned   .  Patent foramen ovale      Small   .  Hypertension  09-05-11     tx. Verapamil   .  Stroke  09-05-11     2006/2009-(loss of memory, balance issues remains occ.)   .  Sleep apnea  09-05-11     no cpap ever, had surgery to remove cartilage, no problems now   .  Hypothyroidism  09-05-11     Supplement used   .  Degenerative joint disease of cervical spine  09-05-11     Cervival area, now some osteoarthritis-lower back and Rt. shoulder   .  Osteoarthritis  112-17-12     spine and rt. hip, rt. shoulder    Past Surgical History   Procedure  Date   .  Abdominal hysterectomy    .  Thyroidectomy, partial  09-05-11   .  Elbow surgery  09-05-11     left elbow -ligament repair   .  Umbilical hernia repair  09-05-11   .  Tubal ligation    .  Colon resection  09/07/2011     Procedure: LAPAROSCOPIC SIGMOID COLON RESECTION; Surgeon: Mariella Saa, MD; Location: [facility]; Service: General; Laterality: N/A; with  proctoscopy   .  Laparoscopy  09/07/2011     Procedure: LAPAROSCOPY DIAGNOSTIC; Surgeon: Roselle Locus II; Location: [facility]; Service: Gynecology; Laterality: N/A;   .  Salpingoophorectomy  09/07/2011     Procedure: SALPINGO OOPHERECTOMY; Surgeon: Roselle Locus II; Location: [facility]; Service: Gynecology; Laterality: Right;    Current Outpatient Prescriptions   Medication  Sig  Dispense  Refill   .  Aspirin-Caffeine (ANACIN) 400-32 MG TABS  Take 2 tablets by mouth every 6 (six) hours as needed. HEADACHE     .  butalbital-acetaminophen-caffeine (FIORICET, ESGIC) 50-325-40 MG per tablet  TAKE 2 TABLETS BY MOUTH EVERY 6 (SIX) HOURS AS NEEDED FOR HEADACHE.  120 tablet  0   .  clopidogrel (PLAVIX) 75 MG tablet  Take 1 tablet (75 mg total) by mouth daily.  90 tablet  6   .  diclofenac (VOLTAREN) 75 MG EC tablet  Take 1 tablet (75 mg total) by mouth 2 (two) times daily.  180 tablet  6   .  levothyroxine (SYNTHROID, LEVOTHROID) 125 MCG tablet  Take 1 tablet (125 mcg total) by mouth daily.  90 tablet  6   .  ranitidine (ZANTAC) 150 MG tablet  Take 75 mg by mouth 2 (two) times daily.     .  simvastatin (ZOCOR) 40 MG tablet  Take 1 tablet (40 mg total) by mouth at bedtime.  90 tablet  6   .  traMADol (ULTRAM) 50 MG tablet  Take 1 tablet (50 mg total) by mouth every 6 (six) hours as needed. PAIN  90 tablet  6   .  verapamil (VERELAN PM) 360 MG 24 hr capsule  Take 1 capsule (360 mg total) by mouth at bedtime.  90 capsule  1    Allergies   Allergen  Reactions   .  Aggrenox (Aspirin-Dipyridamole Er)  Nausea And Vomiting and Other (See Comments)     Headache   .  Dilaudid (Hydromorphone Hcl)  Shortness Of Breath and Nausea And Vomiting   .  Shellfish Allergy  Shortness Of Breath and Rash   .  Codeine Phosphate      REACTION: unspecified   .  Hydrocodone  Nausea And Vomiting   .  Meperidine Hcl  Nausea And Vomiting   .  Percocet (Oxycodone-Acetaminophen)  Nausea And Vomiting   .  Cephalexin  Itching and  Rash    Review of Systems  Constitutional: Positive for fatigue.  Respiratory: Positive for chest tightness and shortness of breath.  Cardiovascular: Negative for chest pain, palpitations and leg swelling.  Gastrointestinal: Positive for abdominal pain and constipation. Negative for nausea, vomiting, blood in stool and abdominal distention.    Objective:   Physical Exam  General: Alert, Mildly obese African American female, in no distress  Skin: Warm and dry without rash or infection.  HEENT: No palpable masses or thyromegaly. Sclera nonicteric. Pupils equal round and reactive. Oropharynx clear.  Lymph nodes: No cervical, supraclavicular, or inguinal nodes palpable.  Lungs: Breath sounds clear and equal without increased work of breathing  Cardiovascular: Regular rate and rhythm without murmur. No JVD or edema. Peripheral pulses intact.  Abdomen: Nondistended. Soft and nontender. No masses palpable. No organomegaly. There is a small to moderate-sized reducible hernia in her small periumbilical incision site.  Extremities: No edema or joint swelling or deformity. No chronic venous stasis changes.  Neurologic: Alert and fully oriented. Gait normal.   Assessment:    Incisional hernia. This will need to be repaired. I discussed laparoscopic repair with her including the indications and risks of general anesthesia, bleeding, infection, recurrence, and visceral injury. We obtained a preoperative CT scan showing an incisional hernia but no other pathology.  Plan:    Laparoscopic repair of ventral incisional hernia

## 2012-06-20 NOTE — Anesthesia Postprocedure Evaluation (Signed)
  Anesthesia Post-op Note  Patient: Abigail [name]  Procedure(s) Performed: Procedure(s) (LRB): LAPAROSCOPIC VENTRAL HERNIA (N/A)  Patient Location: PACU  Anesthesia Type: General  Level of Consciousness: awake and alert   Airway and Oxygen Therapy: Patient Spontanous Breathing  Post-op Pain: mild  Post-op Assessment: Post-op Vital signs reviewed, Patient's Cardiovascular Status Stable, Respiratory Function Stable, Patent Airway and No signs of Nausea or vomiting  Post-op Vital Signs: stable  Complications: No apparent anesthesia complications

## 2012-06-20 NOTE — Transfer of Care (Signed)
Immediate Anesthesia Transfer of Care Note  Patient: Abigail [name]  Procedure(s) Performed: Procedure(s) (LRB) with comments: LAPAROSCOPIC VENTRAL HERNIA (N/A) - Laparoscopic Repair of Ventral Hernia with mesh  Patient Location: PACU  Anesthesia Type: General  Level of Consciousness: awake, alert  and oriented  Airway & Oxygen Therapy: Patient Spontanous Breathing and Patient connected to face mask oxygen  Post-op Assessment: Report given to PACU RN  Post vital signs: Reviewed and stable  Complications: No apparent anesthesia complications

## 2012-06-20 NOTE — Op Note (Signed)
Preoperative Diagnosis: ventral incisional hernia  Postoprative Diagnosis: ventral incisional hernia  Procedure: Procedure(s): LAPAROSCOPIC VENTRAL HERNIA REPAIR   Surgeon: Glenna Fellows T   Assistants: None  Anesthesia:  General endotracheal anesthesiaDiagnos  Indications:   Patient is a 66 year old female with a history of laparoscopic-assisted sigmoid colectomy for diverticulitis. She has now developed an enlarging ventral incisional hernia at the umbilicus after her small extraction site.  We have discussed options and risks detailed extensively elsewhere and have elected to proceed with laparoscopic repair of her ventral incisional hernia.  Procedure Detail:  Patient is brought to the operating room, placed in supine position on the operating table, and general endotracheal anesthesia induced. Foley catheter was placed. She received preoperative IV antibiotics. The abdomen was widely sterilely prepped and draped and the correct patient and procedure were verified. Access was obtained with a 5 mm Optiview trocar in the left upper quadrant without difficulty. Under direct vision a 5 mm and an 11 mm trocar were placed in the left midabdomen and left lower quadrant laterally. The hernia defect was easily visualized. There was a tiny area of omental adhesions that were taken down. The defect was circular measuring about 4.5 cm in diameter. I chose to repair this with a 15 cm diameter Bard VentralLight echo mesh. The mesh was coiled and introduced into the abdominal cavity without difficulty. Through a small stab wound in the center of the hernia defect the insufflation tubing was brought up. The balloon was then inflated and the mesh brought up to the anterior abdominal wall with good wide even deployment in all directions. The mesh was then tacked circumferentially with the Securestrap tacker. The balloon was deflated and removed intact from the abdominal cavity. 0 Prolene fixation sutures  were then placed at the 12, 6, 3 and 9:00 positions. This appeared to provide nice wide part coverage of the defect with the mesh. The abdomen was inspected for hemostasis or any evidence of trocar injury and everything looked fine. Trochars were removed and all CO2 evacuated. Skin incisions were closed with subcuticular Monocryl and Dermabond. Sponge needle and instrument counts were correct.   Estimated Blood Loss:  Minimal         Blood Given: none          Specimens: None        Complications:  * No complications entered in OR log *         Disposition: PACU - hemodynamically stable.         Condition: stable  Mariella Saa MD, FACS  06/20/2012, 11:59 AM

## 2012-06-20 NOTE — Anesthesia Preprocedure Evaluation (Signed)
Anesthesia Evaluation  Patient identified by MRN, date of birth, ID band Patient awake    Reviewed: Allergy & Precautions, H&P , NPO status , Patient's Chart, lab work & pertinent test results  History of Anesthesia Complications (+) PONV  Airway Mallampati: II TM Distance: <3 FB Neck ROM: Full    Dental No notable dental hx.    Pulmonary neg pulmonary ROS,  breath sounds clear to auscultation  Pulmonary exam normal       Cardiovascular hypertension, Pt. on medications Rhythm:Regular Rate:Normal     Neuro/Psych CVA, Residual Symptoms negative psych ROS   GI/Hepatic negative GI ROS, Neg liver ROS,   Endo/Other  Hypothyroidism   Renal/GU negative Renal ROS  negative genitourinary   Musculoskeletal negative musculoskeletal ROS (+)   Abdominal   Peds negative pediatric ROS (+)  Hematology negative hematology ROS (+)   Anesthesia Other Findings   Reproductive/Obstetrics negative OB ROS                           Anesthesia Physical Anesthesia Plan  ASA: III  Anesthesia Plan: General   Post-op Pain Management:    Induction: Intravenous  Airway Management Planned: Oral ETT  Additional Equipment:   Intra-op Plan:   Post-operative Plan:   Informed Consent: I have reviewed the patients History and Physical, chart, labs and discussed the procedure including the risks, benefits and alternatives for the proposed anesthesia with the patient or authorized representative who has indicated his/her understanding and acceptance.   Dental advisory given  Plan Discussed with: CRNA and Surgeon  Anesthesia Plan Comments:         Anesthesia Quick Evaluation

## 2012-06-21 MED ORDER — SALINE SPRAY 0.65 % NA SOLN
1.0000 | NASAL | Status: DC | PRN
  Administered 2012-06-21: 1 via NASAL
  Filled 2012-06-21: qty 44

## 2012-06-21 MED ORDER — ATORVASTATIN CALCIUM 20 MG PO TABS
20.0000 mg | ORAL_TABLET | Freq: Every day | ORAL | Status: DC
  Administered 2012-06-21: 20 mg via ORAL
  Filled 2012-06-21 (×2): qty 1

## 2012-06-21 MED ORDER — FAMOTIDINE 20 MG PO TABS
20.0000 mg | ORAL_TABLET | Freq: Two times a day (BID) | ORAL | Status: DC
  Administered 2012-06-21: 20 mg via ORAL
  Filled 2012-06-21 (×4): qty 1

## 2012-06-21 NOTE — Care Management Note (Signed)
    Page 1 of 1   06/21/2012     1:45:38 PM   CARE MANAGEMENT NOTE 06/21/2012  Patient:  Abigail [name],Abigail [name]   Account Number:  [id number]  Date Initiated:  06/21/2012  Documentation initiated by:  Lorenda Ishihara  Subjective/Objective Assessment:   66 yo female admitted s/p hernia repair. PTA lived at home with spouse     Action/Plan:   Anticipated DC Date:  06/22/2012   Anticipated DC Plan:  HOME/SELF CARE      DC Planning Services  CM consult      Choice offered to / List presented to:             Status of service:  Completed, signed off Medicare Important Message given?   (If response is "NO", the following Medicare IM given date fields will be blank) Date Medicare IM given:   Date Additional Medicare IM given:    Discharge Disposition:  HOME/SELF CARE  Per UR Regulation:  Reviewed for med. necessity/level of care/duration of stay  If discussed at Long Length of Stay Meetings, dates discussed:    Comments:

## 2012-06-21 NOTE — Progress Notes (Signed)
PHARMACIST - PHYSICIAN COMMUNICATION  DESCRIPTION:   Patients on verapamil and simvastatin >10mg /day have reported cases of rhabdomyolysis. Per P/T Policy, if simvastatin dose is > 20 mg, substitute atorvastatin (Lipitor) 1mg  for each 2mg  simvastatin and leave communication form advising MD of change.  Please take this into consideration at discharge.   Thank you.    If you have questions about this conversion, please contact the Pharmacy Department  []   [phone] )  [facility] []   [phone] )  [facility]  []   [phone] )  [facility] [x]   [phone] )  [facility]    Geoffry Paradise, PharmD, BCPS Pager: [phone] 2:23 PM Pharmacy #: [phone]

## 2012-06-21 NOTE — Progress Notes (Signed)
Patient ID: Abigail [name], female   DOB: 03/13/1946, 66 y.o.   MRN: [id number] 1 Day Post-Op  Subjective: No major complaints except she is having quite a bit of pain when she tries to get up. No pain at rest. Mild nausea well controlled with medications.  Objective: Vital signs in last 24 hours: Temp:  [97.4 F (36.3 C)-98.8 F (37.1 C)] 98.7 F (37.1 C) (10/03 0650) Pulse Rate:  [55-68] 55  (10/03 0650) Resp:  [13-22] 18  (10/03 0650) BP: (113-143)/(57-84) 113/63 mmHg (10/03 0650) SpO2:  [93 %-100 %] 94 % (10/03 0650) Weight:  [199 lb (90.266 kg)] 199 lb (90.266 kg) (10/02 1748)    Intake/Output from previous day: 10/02 0701 - 10/03 0700 In: 2817.5 [P.O.:240; I.V.:2577.5] Out: 1750 [Urine:1750] Intake/Output this shift:    General appearance: alert and no distress GI: normal findings: soft, non-tender Incision/Wound: clean and dry  Lab Results:  No results found for this basename: WBC:2,HGB:2,HCT:2,PLT:2 in the last 72 hours BMET No results found for this basename: NA:2,K:2,CL:2,CO2:2,GLUCOSE:2,BUN:2,CREATININE:2,CALCIUM:2 in the last 72 hours   Studies/Results: No results found.  Anti-infectives: Anti-infectives     Start     Dose/Rate Route Frequency Ordered Stop   06/20/12 0819   ciprofloxacin (CIPRO) IVPB 400 mg        400 mg 200 mL/hr over 60 Minutes Intravenous 120 min pre-op 06/20/12 0819 06/20/12 1004          Assessment/Plan: s/p Procedure(s): LAPAROSCOPIC VENTRAL HERNIA Doing well. Still requiring IV pain and nausea medication that I don't think quite ready to go home. She will work on walking and mobilization today and probable discharge tomorrow.   LOS: 1 day    Baylea Milburn T 06/21/2012

## 2012-06-22 ENCOUNTER — Encounter (HOSPITAL_COMMUNITY): Payer: Self-pay | Admitting: General Surgery

## 2012-06-22 MED ORDER — TRAMADOL HCL 50 MG PO TABS: 50.0000 mg | ORAL_TABLET | Freq: Four times a day (QID) | ORAL | Status: DC | PRN

## 2012-06-22 MED ORDER — ONDANSETRON HCL 4 MG PO TABS: 4.0000 mg | ORAL_TABLET | Freq: Four times a day (QID) | ORAL | Status: DC | PRN

## 2012-06-22 NOTE — Discharge Summary (Signed)
  Patient ID: Abigail [name] [id number] 66 y.o. 03-09-1946  06/20/2012  Discharge date and time: 06/22/2012   Admitting Physician: Glenna Fellows T  Discharge Physician: Glenna Fellows T  Admission Diagnoses: ventral incisional hernia  Discharge Diagnoses: same  Operations: Procedure(s): LAPAROSCOPIC VENTRAL HERNIA  Admission Condition: good  Discharged Condition: good  Indication for Admission: patient is a 66 year old female who presents with an enlarging ventral incisional hernia from her periumbilical incision status post laparoscopic colectomy. After discussion of benefits and risks detailed elsewhere she is admitted for elective laparoscopic repair  Hospital Course: on the morning of admission the patient underwent laparoscopic repair of an approximately 5 cm periumbilical hernia without complication. Her postoperative course was smooth. She had some expected pain and nausea on the first postoperative day. This improved and by the second postoperative day she is ambulatory, tolerating her diet, and pain is well-controlled. Abdomen is benign her wound is healing well.   Disposition: Home  Patient Instructions:   Abigail [name], Abigail [name]  Home Medication Instructions [id number]   Printed on:06/22/12 0849  Medication Information                    ranitidine (ZANTAC) 150 MG tablet Take 150 mg by mouth 2 (two) times daily.            diclofenac (VOLTAREN) 75 MG EC tablet Take 1 tablet (75 mg total) by mouth 2 (two) times daily.           simvastatin (ZOCOR) 40 MG tablet Take 1 tablet (40 mg total) by mouth at bedtime.           traMADol (ULTRAM) 50 MG tablet Take 1 tablet (50 mg total) by mouth every 6 (six) hours as needed. PAIN           verapamil (VERELAN PM) 360 MG 24 hr capsule Take 1 capsule (360 mg total) by mouth at bedtime.           clopidogrel (PLAVIX) 75 MG tablet Take 75 mg by mouth every evening.           levothyroxine (SYNTHROID, LEVOTHROID) 125 MCG  tablet Take 125 mcg by mouth daily before breakfast.           tetrahydrozoline 0.05 % ophthalmic solution Place 2 drops into both eyes as needed. Red eyes           Aspirin (ANACIN PO) Take 2 tablets by mouth every 6 (six) hours as needed. headache           neomycin-bacitracin-polymyxin (NEOSPORIN) ointment Apply topically every 12 (twelve) hours. Apply right shoulder as needed           butalbital-acetaminophen-caffeine (FIORICET, ESGIC) 50-325-40 MG per tablet Take 1 tablet by mouth every 6 (six) hours as needed for headache.           ondansetron (ZOFRAN) 4 MG tablet Take 1 tablet (4 mg total) by mouth every 6 (six) hours as needed for nausea.           traMADol (ULTRAM) 50 MG tablet Take 1 tablet (50 mg total) by mouth every 6 (six) hours as needed.             Activity: no lifting,  or strenuous exercise for three-week Diet: regular diet Wound Care: none needed  Follow-up:  With Dr. Johna Sheriff in 3 weeks.  Signed: Mariella Saa MD, FACS  06/22/2012, 8:49 AM

## 2012-06-22 NOTE — Progress Notes (Signed)
Patient ID: Abigail [name], female   DOB: July 15, 1946, 66 y.o.   MRN: [id number] 2 Days Post-Op  Subjective: No major C/O, some expected pain when getting in and out of bed.  Tol diet, wants to go home  Objective: Vital signs in last 24 hours: Temp:  [98.4 F (36.9 C)-98.7 F (37.1 C)] 98.6 F (37 C) (10/04 0610) Pulse Rate:  [54-80] 63  (10/04 0610) Resp:  [18] 18  (10/04 0610) BP: (127-153)/(68-80) 127/68 mmHg (10/04 0610) SpO2:  [94 %-97 %] 97 % (10/04 0610) Last BM Date: 06/20/12  Intake/Output from previous day: 10/03 0701 - 10/04 0700 In: 2880 [P.O.:1080; I.V.:1800] Out: 2650 [Urine:2650] Intake/Output this shift:    General appearance: alert and no distress GI: abnormal findings:  mild tenderness in the periumbilical area Incision/Wound: Clean  Lab Results:  No results found for this basename: WBC:2,HGB:2,HCT:2,PLT:2 in the last 72 hours BMET No results found for this basename: NA:2,K:2,CL:2,CO2:2,GLUCOSE:2,BUN:2,CREATININE:2,CALCIUM:2 in the last 72 hours   Studies/Results: No results found.  Anti-infectives: Anti-infectives     Start     Dose/Rate Route Frequency Ordered Stop   06/20/12 0819   ciprofloxacin (CIPRO) IVPB 400 mg        400 mg 200 mL/hr over 60 Minutes Intravenous 120 min pre-op 06/20/12 0819 06/20/12 1004          Assessment/Plan: s/p Procedure(s): LAPAROSCOPIC VENTRAL HERNIA Doing well without apparent complication. Okay for discharge today.   LOS: 2 days    Joie Reamer T 06/22/2012

## 2012-06-26 ENCOUNTER — Telehealth (INDEPENDENT_AMBULATORY_CARE_PROVIDER_SITE_OTHER): Payer: Self-pay

## 2012-06-26 NOTE — Telephone Encounter (Signed)
Pt called today c/o burning pain in RLQ.  She only gets minimal relief when she takes the Tramadol.  She also had redness on her abdomen, but it resolved when she took off her binder.  Please advise.

## 2012-07-11 ENCOUNTER — Other Ambulatory Visit: Payer: Self-pay | Admitting: Internal Medicine

## 2012-07-12 ENCOUNTER — Ambulatory Visit (INDEPENDENT_AMBULATORY_CARE_PROVIDER_SITE_OTHER): Payer: Medicare Other | Admitting: General Surgery

## 2012-07-12 VITALS — BP 140/98 | HR 45 | Temp 96.8°F | Resp 20 | Ht 65.5 in | Wt 195.8 lb

## 2012-07-12 DIAGNOSIS — Z09 Encounter for follow-up examination after completed treatment for conditions other than malignant neoplasm: Secondary | ICD-10-CM

## 2012-07-12 NOTE — Progress Notes (Signed)
History: Patient returns just over 3 weeks following laparoscopic repair of her ventral incisional hernia. She's had some expected discomfort and soreness but this is steadily improving and she is pleased with her progress. She had one day of some significant pain in her right flank and also one day of some pain in her left leg but this is now all resolved.  Exam: Gen.: Appears well Abdomen: Soft and nontender. Incisions are well healed. Hernia repair feels solid.  Assessment and plan: Doing well following laparoscopic ventral hernia repair without complication identified. She will return in one month for a final check.

## 2012-07-24 ENCOUNTER — Telehealth: Payer: Self-pay | Admitting: Internal Medicine

## 2012-07-24 NOTE — Telephone Encounter (Signed)
Caller: Corianne/Patient; Patient Name: Abigail [name]; PCP: Eleonore Chiquito [facility]); Best Callback Phone Number: [phone]. States that she started with Diarrhea yesterday, 07/23/12. Has become almost uncontrollable and is not being able to make it to the bathroom. Is very sore internally and wanted to be seen in office tomorrow, 07/25/12. Afebrile.  States that anything she tries to eat or drink comes back out "violently". Has not been drinking since 0730AM when taking medications.  States that she last urinated approximately 15 minutes ago. States that inside of abdomen is sore, does not hurt to the touch. All emergent symptoms per Diarrhea or Other Changes in Bowel Habits protocol ruled out. Home care advice given. Advised appointment within 24 hours per guidelines. Appointment scheduled for 07/25/12 with Dr. Lesia Hausen, tomorrow, 07/25/12 at 1015AM.

## 2012-07-25 ENCOUNTER — Encounter: Payer: Self-pay | Admitting: Internal Medicine

## 2012-07-25 ENCOUNTER — Ambulatory Visit (INDEPENDENT_AMBULATORY_CARE_PROVIDER_SITE_OTHER): Payer: Medicare Other | Admitting: Internal Medicine

## 2012-07-25 VITALS — BP 130/80 | Temp 97.9°F | Wt 195.0 lb

## 2012-07-25 DIAGNOSIS — Z23 Encounter for immunization: Secondary | ICD-10-CM

## 2012-07-25 DIAGNOSIS — K5732 Diverticulitis of large intestine without perforation or abscess without bleeding: Secondary | ICD-10-CM

## 2012-07-25 DIAGNOSIS — I10 Essential (primary) hypertension: Secondary | ICD-10-CM

## 2012-07-25 MED ORDER — CIPROFLOXACIN HCL 500 MG PO TABS: 500.0000 mg | ORAL_TABLET | Freq: Two times a day (BID) | ORAL | Status: DC

## 2012-07-25 MED ORDER — METRONIDAZOLE 500 MG PO TABS: 500.0000 mg | ORAL_TABLET | Freq: Three times a day (TID) | ORAL | Status: DC

## 2012-07-25 NOTE — Progress Notes (Signed)
Subjective:    Patient ID: Abigail [name], female    DOB: 11/02/1945, 66 y.o.   MRN: [id number]  HPI  66 year old patient who has a history recurrent diverticulitis. She's had a partial colectomy in the past do to the complex diverticular disease. For the past 3 days she has had recurrent abdominal pain. She states the pain is identical to the pain associated with prior episodes of acute diverticulitis. There's been no fever. She has noted diarrhea for the past 2 days. No nausea or vomiting.  Past Medical History  Diagnosis Date  . Hyperlipidemia   . History of colonic polyps   . Late effect of adverse effect of drug, medicinal or biological substance   . History of tension headache   . Heart palpitations   . Diverticulitis 09-05-11    hx. gastritis, diverticulitis x2 -now surgery planned  . Patent foramen ovale     Small  . Hypertension 09-05-11    tx. Verapamil  . Hypothyroidism 09-05-11    Supplement used  . Degenerative joint disease of cervical spine 09-05-11    Cervival area, now some osteoarthritis-lower back and Rt. shoulder  . Osteoarthritis 112-17-12    spine and rt. hip, rt. shoulder  . Sleep apnea 09-05-11    no cpap ever, had surgery to remove cartilage, no problems now  . Stroke 09-05-11    2006/2009-(loss of memory, balance issues remains occ.)  . Cervical lymphadenopathy     Right side  . PONV (postoperative nausea and vomiting)     History   Social History  . Marital Status: Married    Spouse Name: N/A    Number of Children: N/A  . Years of Education: N/A   Occupational History  . Not on file.   Social History Main Topics  . Smoking status: Former Smoker -- 0.5 packs/day for 5 years    Types: Cigarettes    Quit date: 09/19/1968  . Smokeless tobacco: Never Used  . Alcohol Use: Yes     Comment: social -rare  . Drug Use: No  . Sexually Active: Yes   Other Topics Concern  . Not on file   Social History Narrative  . No narrative on file    Past  Surgical History  Procedure Date  . Abdominal hysterectomy   . Thyroidectomy, partial 09-05-11  . Elbow surgery 09-05-11    left elbow -ligament repair  . Umbilical hernia repair yrs ago  . Colon resection 09/07/2011    Procedure: LAPAROSCOPIC SIGMOID COLON RESECTION;  Surgeon: Mariella Saa, MD;  Location: [facility];  Service: General;  Laterality: N/A;  with proctoscopy  . Laparoscopy 09/07/2011    Procedure: LAPAROSCOPY DIAGNOSTIC;  Surgeon: Roselle Locus II;  Location: [facility];  Service: Gynecology;  Laterality: N/A;  . Salpingoophorectomy 09/07/2011    Procedure: SALPINGO OOPHERECTOMY;  Surgeon: Roselle Locus II;  Location: [facility];  Service: Gynecology;  Laterality: Right;  . Tubal ligation 1983  . Ventral hernia repair 06/20/2012    Procedure: LAPAROSCOPIC VENTRAL HERNIA;  Surgeon: Mariella Saa, MD;  Location: [facility];  Service: General;  Laterality: N/A;  Laparoscopic Repair of Ventral Hernia with mesh    No family history on file.  Allergies  Allergen Reactions  . Aggrenox (Aspirin-Dipyridamole Er) Nausea And Vomiting and Other (See Comments)    Headache   . Dilaudid (Hydromorphone Hcl) Shortness Of Breath and Nausea And Vomiting  . Shellfish Allergy Shortness Of Breath and Rash    Only shrimp  allergy  . Codeine Phosphate Nausea And Vomiting    REACTION: unspecified  . Hydrocodone Nausea And Vomiting  . Meperidine Hcl Nausea And Vomiting  . Percocet (Oxycodone-Acetaminophen) Nausea And Vomiting  . Cephalexin Itching and Rash    Current Outpatient Prescriptions on File Prior to Visit  Medication Sig Dispense Refill  . butalbital-acetaminophen-caffeine (FIORICET, ESGIC) 50-325-40 MG per tablet TAKE 2 TABLETS BY MOUTH EVERY 6 HOURS AS NEEDED FOR HEADACHE  120 tablet  0  . clopidogrel (PLAVIX) 75 MG tablet Take 75 mg by mouth every evening.      . diclofenac (VOLTAREN) 75 MG EC tablet Take 1 tablet (75 mg total) by mouth 2 (two) times daily.  180 tablet  6  .  levothyroxine (SYNTHROID, LEVOTHROID) 125 MCG tablet Take 125 mcg by mouth daily before breakfast.      . neomycin-bacitracin-polymyxin (NEOSPORIN) ointment Apply topically every 12 (twelve) hours. Apply right shoulder as needed      . ranitidine (ZANTAC) 150 MG tablet Take 150 mg by mouth 2 (two) times daily.       . simvastatin (ZOCOR) 40 MG tablet Take 1 tablet (40 mg total) by mouth at bedtime.  90 tablet  6  . tetrahydrozoline 0.05 % ophthalmic solution Place 2 drops into both eyes as needed. Red eyes      . traMADol (ULTRAM) 50 MG tablet Take 1 tablet (50 mg total) by mouth every 6 (six) hours as needed. PAIN  90 tablet  6  . verapamil (VERELAN PM) 360 MG 24 hr capsule Take 1 capsule (360 mg total) by mouth at bedtime.  90 capsule  1    BP 130/80  Temp 97.9 F (36.6 C) (Oral)  Wt 195 lb (88.451 kg)       Review of Systems  Constitutional: Negative.   HENT: Negative for hearing loss, congestion, sore throat, rhinorrhea, dental problem, sinus pressure and tinnitus.   Eyes: Negative for pain, discharge and visual disturbance.  Respiratory: Negative for cough and shortness of breath.   Cardiovascular: Negative for chest pain, palpitations and leg swelling.  Gastrointestinal: Positive for abdominal pain and diarrhea. Negative for nausea, vomiting, constipation, blood in stool and abdominal distention.  Genitourinary: Negative for dysuria, urgency, frequency, hematuria, flank pain, vaginal bleeding, vaginal discharge, difficulty urinating, vaginal pain and pelvic pain.  Musculoskeletal: Negative for joint swelling, arthralgias and gait problem.  Skin: Negative for rash.  Neurological: Negative for dizziness, syncope, speech difficulty, weakness, numbness and headaches.  Hematological: Negative for adenopathy.  Psychiatric/Behavioral: Negative for behavioral problems, dysphoric mood and agitation. The patient is not nervous/anxious.        Objective:   Physical Exam    Constitutional: She is oriented to person, place, and time. She appears well-developed and well-nourished.  HENT:  Head: Normocephalic.  Right Ear: External ear normal.  Left Ear: External ear normal.  Mouth/Throat: Oropharynx is clear and moist.  Eyes: Conjunctivae normal and EOM are normal. Pupils are equal, round, and reactive to light.  Neck: Normal range of motion. Neck supple. No thyromegaly present.  Cardiovascular: Normal rate, regular rhythm, normal heart sounds and intact distal pulses.   Pulmonary/Chest: Effort normal and breath sounds normal.  Abdominal: Soft. She exhibits no distension and no mass. There is tenderness (bowel sounds). There is rebound. There is no guarding.       Abdomen is flat and nondistended No significant guarding Considerable tenderness is noted in the mid and left abdominal quadrants mainly Rebound tenderness noted Bowel sounds  diminished but present  Musculoskeletal: Normal range of motion.  Lymphadenopathy:    She has no cervical adenopathy.  Neurological: She is alert and oriented to person, place, and time.  Skin: Skin is warm and dry. No rash noted.  Psychiatric: She has a normal mood and affect. Her behavior is normal.          Assessment & Plan:    probable recurrent diverticulitis. Diarrhea is somewhat atypical. Will treat with aggressive antibiotic therapy and recheck in 48 hours. ED evaluation if any clinical worsening Hypertension

## 2012-07-25 NOTE — Patient Instructions (Signed)
Take your antibiotic as prescribed until ALL of it is gone, but stop if you develop a rash, swelling, or any side effects of the medication.  Contact our office as soon as possible if  there are side effects of the medication.  Emergency department evaluation if any clinical worsening  Recheck in 2 days  Drink as much fluid as you  can tolerate over the next few days  ALIGN  one daily for 2 weeks

## 2012-07-27 ENCOUNTER — Other Ambulatory Visit: Payer: Self-pay | Admitting: Internal Medicine

## 2012-07-30 ENCOUNTER — Other Ambulatory Visit: Payer: Self-pay | Admitting: General Practice

## 2012-07-30 MED ORDER — ATORVASTATIN CALCIUM 40 MG PO TABS: 40.0000 mg | ORAL_TABLET | Freq: Every day | ORAL | Status: DC

## 2012-07-30 NOTE — Telephone Encounter (Signed)
Med changed and filled.

## 2012-07-30 NOTE — Telephone Encounter (Signed)
Med filled. Old Rx had expired before all refills used.

## 2012-07-31 ENCOUNTER — Telehealth: Payer: Self-pay | Admitting: Internal Medicine

## 2012-07-31 NOTE — Telephone Encounter (Signed)
Pt is aware that the simvastatin 40 mg may have a reaction with her other meds. Dr. Kirtland Bouchard changed her to the atorvastatin 40 mg. To avoid an interaction.

## 2012-07-31 NOTE — Telephone Encounter (Signed)
Caller: Tranice/Patient; Patient Name: Abigail [name]; PCP: Eleonore Chiquito [facility]); Best Callback Phone Number: [phone].  Patient returning call from [facility] 07/30/12.  States she got a call from the office but did not understand the voicemail.  Per Epic, no telephone note in system, but Rx sent to her pharmacy for atorvastatin 40mg , 1 tab q day.  States has been taking simvastatin for a long time, and this is "the wrong medication."  Unable to answer patient's questions; info to office for staff/provider review/callback.   May reach patient at [phone].

## 2012-08-07 ENCOUNTER — Other Ambulatory Visit: Payer: Self-pay | Admitting: Internal Medicine

## 2012-08-07 NOTE — Telephone Encounter (Signed)
Fioricet refill request. Last filled on 10/23 qty 120 0 refills. Pt last seen on 11/6. Ok to refill?

## 2012-08-07 NOTE — Telephone Encounter (Signed)
Med phoned in °

## 2012-08-07 NOTE — Telephone Encounter (Signed)
ok 

## 2012-08-08 ENCOUNTER — Ambulatory Visit (INDEPENDENT_AMBULATORY_CARE_PROVIDER_SITE_OTHER): Payer: Medicare Other | Admitting: Internal Medicine

## 2012-08-08 DIAGNOSIS — Z23 Encounter for immunization: Secondary | ICD-10-CM

## 2012-08-23 ENCOUNTER — Telehealth (INDEPENDENT_AMBULATORY_CARE_PROVIDER_SITE_OTHER): Payer: Self-pay

## 2012-08-23 NOTE — Telephone Encounter (Signed)
Follow up call to patient to see how she's feeling.  Patient reports some discomfort around her umbilicus.  Patient appointment moved to 1:45 pm on 08/24/12 w/Dr. Johna Sheriff.

## 2012-08-24 ENCOUNTER — Ambulatory Visit (INDEPENDENT_AMBULATORY_CARE_PROVIDER_SITE_OTHER): Payer: Medicare Other | Admitting: General Surgery

## 2012-08-24 ENCOUNTER — Encounter (INDEPENDENT_AMBULATORY_CARE_PROVIDER_SITE_OTHER): Payer: Self-pay | Admitting: General Surgery

## 2012-08-24 VITALS — BP 136/80 | HR 57 | Temp 97.0°F | Ht 65.5 in | Wt 193.8 lb

## 2012-08-24 DIAGNOSIS — Z09 Encounter for follow-up examination after completed treatment for conditions other than malignant neoplasm: Secondary | ICD-10-CM

## 2012-08-24 NOTE — Progress Notes (Signed)
History: The patient returns for more long-term followup and possibly 9 weeks laparoscopic repair of incisional hernia post colectomy. She states she is feeling pretty well but did a little increased physical activity around Thanksgiving the couple of weeks ago and has noted some persistent soreness at the incision site. She also saw Dr. Lesia Hausen. Therefore an episode of acute [place] pain very similar to previous diverticulitis attacks. This resolved after a few days of antibiotics.  Exam: BP 136/80  Pulse 57  Temp 97 F (36.1 C) (Temporal)  Ht 5' 5.5" (1.664 m)  Wt 193 lb 12.8 oz (87.907 kg)  BMI 31.76 kg/m2  SpO2 98% General: No distress Abdomen: With patient standing I cannot feel evidence of recurrent hernia. There is mild tenderness.  Assessment and plan: Status post ventral hernia repair. She has some discomfort but her exam seems okay. She has been doing a little more physical activity. I told her to monitor this and if she is still having discomfort in 4 weeks I will get her back in for recheck. Encouraged to call me at any point should she have concerns or any symptoms similar to recurrent diverticulitis again.

## 2012-08-24 NOTE — Patient Instructions (Signed)
Call us in 4 weeks if discomfort does not resolve then we will recheck you.

## 2012-09-05 ENCOUNTER — Other Ambulatory Visit: Payer: Self-pay | Admitting: Internal Medicine

## 2012-09-06 ENCOUNTER — Other Ambulatory Visit: Payer: Self-pay | Admitting: *Deleted

## 2012-09-12 ENCOUNTER — Other Ambulatory Visit: Payer: Self-pay | Admitting: Internal Medicine

## 2012-09-26 ENCOUNTER — Other Ambulatory Visit: Payer: Self-pay | Admitting: Internal Medicine

## 2012-10-22 ENCOUNTER — Telehealth (INDEPENDENT_AMBULATORY_CARE_PROVIDER_SITE_OTHER): Payer: Self-pay | Admitting: General Surgery

## 2012-10-22 NOTE — Telephone Encounter (Signed)
Patient called in stating that she had another bout of diverticulitis. Patient stated she was instructed by Dr. Johna Sheriff to call so that he can see her if it happens again. Patient said it was very bad last night, but did not need an appointment to be seen by him today, but whatever he had available.   I checked and her last appointment in December was a 15 minute post op, but he does not have any available on his schedule right now. Patient confirmed her contact number is [phone] and that a message can be left on her voicemail if she cannot pick up.

## 2012-10-25 ENCOUNTER — Telehealth (INDEPENDENT_AMBULATORY_CARE_PROVIDER_SITE_OTHER): Payer: Self-pay

## 2012-10-25 ENCOUNTER — Other Ambulatory Visit: Payer: Self-pay | Admitting: Internal Medicine

## 2012-10-25 NOTE — Telephone Encounter (Signed)
Return call to patient re:   follow up appointment made for Thursday 11/01/12 @ 9:50 am w/Dr. Johna Sheriff.

## 2012-11-01 ENCOUNTER — Ambulatory Visit (INDEPENDENT_AMBULATORY_CARE_PROVIDER_SITE_OTHER): Payer: Medicare Other | Admitting: General Surgery

## 2012-11-02 ENCOUNTER — Ambulatory Visit (INDEPENDENT_AMBULATORY_CARE_PROVIDER_SITE_OTHER): Payer: Medicare Other | Admitting: General Surgery

## 2012-11-05 ENCOUNTER — Encounter (INDEPENDENT_AMBULATORY_CARE_PROVIDER_SITE_OTHER): Payer: Self-pay

## 2012-11-29 ENCOUNTER — Telehealth: Payer: Self-pay | Admitting: Internal Medicine

## 2012-12-03 ENCOUNTER — Other Ambulatory Visit: Payer: Self-pay | Admitting: *Deleted

## 2012-12-03 MED ORDER — BUTALBITAL-APAP-CAFFEINE 50-325-40 MG PO TABS: 2.0000 | ORAL_TABLET | Freq: Four times a day (QID) | ORAL | Status: DC | PRN

## 2012-12-03 NOTE — Telephone Encounter (Signed)
Pt notified Rx refill for Fioricet was faxed to pharmacy.

## 2012-12-03 NOTE — Telephone Encounter (Signed)
Pt is out of fioricet

## 2012-12-17 ENCOUNTER — Encounter: Payer: Self-pay | Admitting: Internal Medicine

## 2012-12-17 ENCOUNTER — Ambulatory Visit (INDEPENDENT_AMBULATORY_CARE_PROVIDER_SITE_OTHER): Payer: Medicare PPO | Admitting: Internal Medicine

## 2012-12-17 VITALS — BP 130/80 | HR 73 | Temp 98.5°F | Resp 18 | Wt 197.0 lb

## 2012-12-17 DIAGNOSIS — K5289 Other specified noninfective gastroenteritis and colitis: Secondary | ICD-10-CM

## 2012-12-17 DIAGNOSIS — I10 Essential (primary) hypertension: Secondary | ICD-10-CM

## 2012-12-17 DIAGNOSIS — K529 Noninfective gastroenteritis and colitis, unspecified: Secondary | ICD-10-CM

## 2012-12-17 NOTE — Progress Notes (Signed)
Subjective:    Patient ID: Abigail [name], female    DOB: Jul 07, 1946, 67 y.o.   MRN: [id number]  HPI  67 year old patient who has treated hypertension and a history of diverticulitis. Over the weekend she had the 24 hour history of profuse diarrhea as well as some crampy abdominal pain this was associated with nausea but no vomiting. She used Imodium and diarrhea resolved after 3 pills. Today she has had no recurrent diarrhea but has mild abdominal discomfort and weakness. She is tolerating a soft diet without difficulty. No fever or chills  Past Medical History  Diagnosis Date  . Hyperlipidemia   . History of colonic polyps   . Late effect of adverse effect of drug, medicinal or biological substance   . History of tension headache   . Heart palpitations   . Diverticulitis 09-05-11    hx. gastritis, diverticulitis x2 -now surgery planned  . Patent foramen ovale     Small  . Hypertension 09-05-11    tx. Verapamil  . Hypothyroidism 09-05-11    Supplement used  . Degenerative joint disease of cervical spine 09-05-11    Cervival area, now some osteoarthritis-lower back and Rt. shoulder  . Osteoarthritis 112-17-12    spine and rt. hip, rt. shoulder  . Sleep apnea 09-05-11    no cpap ever, had surgery to remove cartilage, no problems now  . Stroke 09-05-11    2006/2009-(loss of memory, balance issues remains occ.)  . Cervical lymphadenopathy     Right side  . PONV (postoperative nausea and vomiting)     History   Social History  . Marital Status: Married    Spouse Name: N/A    Number of Children: N/A  . Years of Education: N/A   Occupational History  . Not on file.   Social History Main Topics  . Smoking status: Former Smoker -- 0.50 packs/day for 5 years    Types: Cigarettes    Quit date: 09/19/1968  . Smokeless tobacco: Never Used  . Alcohol Use: Yes     Comment: social -rare  . Drug Use: No  . Sexually Active: Yes   Other Topics Concern  . Not on file   Social History  Narrative  . No narrative on file    Past Surgical History  Procedure Laterality Date  . Abdominal hysterectomy    . Thyroidectomy, partial  09-05-11  . Elbow surgery  09-05-11    left elbow -ligament repair  . Umbilical hernia repair  yrs ago  . Colon resection  09/07/2011    Procedure: LAPAROSCOPIC SIGMOID COLON RESECTION;  Surgeon: Mariella Saa, MD;  Location: [facility];  Service: General;  Laterality: N/A;  with proctoscopy  . Laparoscopy  09/07/2011    Procedure: LAPAROSCOPY DIAGNOSTIC;  Surgeon: Roselle Locus II;  Location: [facility];  Service: Gynecology;  Laterality: N/A;  . Salpingoophorectomy  09/07/2011    Procedure: SALPINGO OOPHERECTOMY;  Surgeon: Roselle Locus II;  Location: [facility];  Service: Gynecology;  Laterality: Right;  . Tubal ligation  1983  . Ventral hernia repair  06/20/2012    Procedure: LAPAROSCOPIC VENTRAL HERNIA;  Surgeon: Mariella Saa, MD;  Location: [facility];  Service: General;  Laterality: N/A;  Laparoscopic Repair of Ventral Hernia with mesh    No family history on file.  Allergies  Allergen Reactions  . Aggrenox (Aspirin-Dipyridamole Er) Nausea And Vomiting and Other (See Comments)    Headache   . Dilaudid (Hydromorphone Hcl) Shortness Of Breath and  Nausea And Vomiting  . Shellfish Allergy Shortness Of Breath and Rash    Only shrimp allergy  . Codeine Phosphate Nausea And Vomiting    REACTION: unspecified  . Hydrocodone Nausea And Vomiting  . Meperidine Hcl Nausea And Vomiting  . Percocet (Oxycodone-Acetaminophen) Nausea And Vomiting  . Cephalexin Itching and Rash    Current Outpatient Prescriptions on File Prior to Visit  Medication Sig Dispense Refill  . atorvastatin (LIPITOR) 40 MG tablet Take 1 tablet (40 mg total) by mouth daily.  90 tablet  3  . butalbital-acetaminophen-caffeine (FIORICET, ESGIC) 50-325-40 MG per tablet Take 2 tablets by mouth every 6 (six) hours as needed for headache.  120 tablet  0  . clopidogrel (PLAVIX)  75 MG tablet Take 75 mg by mouth every evening.      . diclofenac (VOLTAREN) 75 MG EC tablet Take 1 tablet (75 mg total) by mouth 2 (two) times daily.  180 tablet  6  . levothyroxine (SYNTHROID, LEVOTHROID) 125 MCG tablet Take 125 mcg by mouth daily before breakfast.      . neomycin-bacitracin-polymyxin (NEOSPORIN) ointment Apply topically every 12 (twelve) hours. Apply right shoulder as needed      . ranitidine (ZANTAC) 150 MG tablet Take 150 mg by mouth 2 (two) times daily.       Marland Kitchen tetrahydrozoline 0.05 % ophthalmic solution Place 2 drops into both eyes as needed. Red eyes      . traMADol (ULTRAM) 50 MG tablet Take 1 tablet (50 mg total) by mouth every 6 (six) hours as needed. PAIN  90 tablet  6  . verapamil (VERELAN PM) 360 MG 24 hr capsule Take 1 capsule (360 mg total) by mouth at bedtime.  90 capsule  1  . verapamil (VERELAN PM) 360 MG 24 hr capsule TAKE ONE CAPSULE ONCE DAILY  90 capsule  1   No current facility-administered medications on file prior to visit.    BP 130/80  Pulse 73  Temp(Src) 98.5 F (36.9 C) (Oral)  Resp 18  Wt 197 lb (89.359 kg)  BMI 32.27 kg/m2  SpO2 98%       Review of Systems  Constitutional: Negative.   HENT: Negative for hearing loss, congestion, sore throat, rhinorrhea, dental problem, sinus pressure and tinnitus.   Eyes: Negative for pain, discharge and visual disturbance.  Respiratory: Negative for cough and shortness of breath.   Cardiovascular: Negative for chest pain, palpitations and leg swelling.  Gastrointestinal: Positive for nausea, abdominal pain and diarrhea. Negative for vomiting, constipation, blood in stool and abdominal distention.  Genitourinary: Negative for dysuria, urgency, frequency, hematuria, flank pain, vaginal bleeding, vaginal discharge, difficulty urinating, vaginal pain and pelvic pain.  Musculoskeletal: Negative for joint swelling, arthralgias and gait problem.  Skin: Negative for rash.  Neurological: Negative for  dizziness, syncope, speech difficulty, weakness, numbness and headaches.  Hematological: Negative for adenopathy.  Psychiatric/Behavioral: Negative for behavioral problems, dysphoric mood and agitation. The patient is not nervous/anxious.        Objective:   Physical Exam  Constitutional: She is oriented to person, place, and time. She appears well-developed and well-nourished.  Unwell but in no acute distress. Blood pressure normal afebrile Pulse rate 73   HENT:  Head: Normocephalic.  Right Ear: External ear normal.  Left Ear: External ear normal.  Mouth/Throat: Oropharynx is clear and moist.  Eyes: Conjunctivae and EOM are normal. Pupils are equal, round, and reactive to light.  Neck: Normal range of motion. Neck supple. No thyromegaly present.  Cardiovascular: Normal rate, regular rhythm, normal heart sounds and intact distal pulses.   Pulmonary/Chest: Effort normal and breath sounds normal.  Abdominal: Soft. Bowel sounds are normal. She exhibits no mass. There is no tenderness.  Mild tenderness without guarding. Bowel sounds normal  Musculoskeletal: Normal range of motion.  Lymphadenopathy:    She has no cervical adenopathy.  Neurological: She is alert and oriented to person, place, and time.  Skin: Skin is warm and dry. No rash noted.  Psychiatric: She has a normal mood and affect. Her behavior is normal.          Assessment & Plan:   Acute viral gastroenteritis. Symptoms have greatly improved and the patient has had no further diarrhea today. We'll force fluids and slowly advance her diet. Hypertension stable

## 2012-12-17 NOTE — Patient Instructions (Signed)
Drink as much fluid as you  can tolerate over the next few days  Slowly advance your diet as tolerated  Call or return to clinic prn if these symptoms worsen or fail to improve as anticipated.

## 2012-12-18 ENCOUNTER — Telehealth: Payer: Self-pay | Admitting: Internal Medicine

## 2012-12-18 ENCOUNTER — Encounter: Payer: Self-pay | Admitting: Internal Medicine

## 2012-12-18 ENCOUNTER — Ambulatory Visit (INDEPENDENT_AMBULATORY_CARE_PROVIDER_SITE_OTHER): Payer: Medicare PPO | Admitting: Internal Medicine

## 2012-12-18 VITALS — BP 138/80 | HR 67 | Temp 98.3°F | Resp 20 | Wt 196.0 lb

## 2012-12-18 DIAGNOSIS — R197 Diarrhea, unspecified: Secondary | ICD-10-CM

## 2012-12-18 DIAGNOSIS — I10 Essential (primary) hypertension: Secondary | ICD-10-CM

## 2012-12-18 MED ORDER — DIPHENOXYLATE-ATROPINE 2.5-0.025 MG PO TABS: 1.0000 | ORAL_TABLET | Freq: Four times a day (QID) | ORAL | Status: DC | PRN

## 2012-12-18 NOTE — Patient Instructions (Addendum)
Diarrhea  Diarrhea is watery poop (stool). The most common cause of diarrhea is a germ. Other causes include:   Food poisoning.   A reaction to medicine.  HOME CARE    Drink clear fluids. This can stop you from losing too much body fluid (dehydration).   Drink enough fluids to keep your pee (urine) clear or pale yellow.   Avoid solid foods and dairy products until you start to feel better. Then start eating bland foods, such as:   Bananas.   Rice.   Crackers.   Applesauce.   Dry toast.   Avoid spicy foods, caffeine, and alcohol.   Your doctor may give medicine to help with cramps and watery poop. Take this as told. Avoid these medicines if you have a fever or blood in your poop.   Take your medicine as told. Finish them even if you start to feel better.  GET HELP RIGHT AWAY IF:    The watery poop lasts longer than 3 days.   You have a fever.   Your baby is older than 3 months with a rectal temperature of 100.5 F (38.1 C) or higher for more than 1 day.   There is blood in your poop.   You start to throw up (vomit).   You lose too much fluid.  MAKE SURE YOU:    Understand these instructions.   Will watch your condition.   Will get help right away if you are not doing well or get worse.  Document Released: 02/22/2008 Document Revised: 11/28/2011 Document Reviewed: 02/22/2008  [employer] Patient Information 2013 [employer]

## 2012-12-18 NOTE — Progress Notes (Signed)
Subjective:    Patient ID: Abigail [name], female    DOB: 05-28-1946, 67 y.o.   MRN: [id number]  HPI  67 year old patient who was seen yesterday with a complaint of diarrhea which had resolved. Late yesterday afternoon after dance in her diet she had recurrent diarrhea through the night. Today diarrhea has again resolved. She has been using Imodium A-D.   She called the office and it was suggested she be reevaluated. No nausea vomiting or abdominal pain  Past Medical History  Diagnosis Date  . Hyperlipidemia   . History of colonic polyps   . Late effect of adverse effect of drug, medicinal or biological substance   . History of tension headache   . Heart palpitations   . Diverticulitis 09-05-11    hx. gastritis, diverticulitis x2 -now surgery planned  . Patent foramen ovale     Small  . Hypertension 09-05-11    tx. Verapamil  . Hypothyroidism 09-05-11    Supplement used  . Degenerative joint disease of cervical spine 09-05-11    Cervival area, now some osteoarthritis-lower back and Rt. shoulder  . Osteoarthritis 112-17-12    spine and rt. hip, rt. shoulder  . Sleep apnea 09-05-11    no cpap ever, had surgery to remove cartilage, no problems now  . Stroke 09-05-11    2006/2009-(loss of memory, balance issues remains occ.)  . Cervical lymphadenopathy     Right side  . PONV (postoperative nausea and vomiting)     History   Social History  . Marital Status: Married    Spouse Name: N/A    Number of Children: N/A  . Years of Education: N/A   Occupational History  . Not on file.   Social History Main Topics  . Smoking status: Former Smoker -- 0.50 packs/day for 5 years    Types: Cigarettes    Quit date: 09/19/1968  . Smokeless tobacco: Never Used  . Alcohol Use: Yes     Comment: social -rare  . Drug Use: No  . Sexually Active: Yes   Other Topics Concern  . Not on file   Social History Narrative  . No narrative on file    Past Surgical History  Procedure Laterality  Date  . Abdominal hysterectomy    . Thyroidectomy, partial  09-05-11  . Elbow surgery  09-05-11    left elbow -ligament repair  . Umbilical hernia repair  yrs ago  . Colon resection  09/07/2011    Procedure: LAPAROSCOPIC SIGMOID COLON RESECTION;  Surgeon: Mariella Saa, MD;  Location: [facility];  Service: General;  Laterality: N/A;  with proctoscopy  . Laparoscopy  09/07/2011    Procedure: LAPAROSCOPY DIAGNOSTIC;  Surgeon: Roselle Locus II;  Location: [facility];  Service: Gynecology;  Laterality: N/A;  . Salpingoophorectomy  09/07/2011    Procedure: SALPINGO OOPHERECTOMY;  Surgeon: Roselle Locus II;  Location: [facility];  Service: Gynecology;  Laterality: Right;  . Tubal ligation  1983  . Ventral hernia repair  06/20/2012    Procedure: LAPAROSCOPIC VENTRAL HERNIA;  Surgeon: Mariella Saa, MD;  Location: [facility];  Service: General;  Laterality: N/A;  Laparoscopic Repair of Ventral Hernia with mesh    No family history on file.  Allergies  Allergen Reactions  . Aggrenox (Aspirin-Dipyridamole Er) Nausea And Vomiting and Other (See Comments)    Headache   . Dilaudid (Hydromorphone Hcl) Shortness Of Breath and Nausea And Vomiting  . Shellfish Allergy Shortness Of Breath and Rash  Only shrimp allergy  . Codeine Phosphate Nausea And Vomiting    REACTION: unspecified  . Hydrocodone Nausea And Vomiting  . Meperidine Hcl Nausea And Vomiting  . Percocet (Oxycodone-Acetaminophen) Nausea And Vomiting  . Cephalexin Itching and Rash    Current Outpatient Prescriptions on File Prior to Visit  Medication Sig Dispense Refill  . atorvastatin (LIPITOR) 40 MG tablet Take 1 tablet (40 mg total) by mouth daily.  90 tablet  3  . butalbital-acetaminophen-caffeine (FIORICET, ESGIC) 50-325-40 MG per tablet Take 2 tablets by mouth every 6 (six) hours as needed for headache.  120 tablet  0  . clopidogrel (PLAVIX) 75 MG tablet Take 75 mg by mouth every evening.      . diclofenac (VOLTAREN) 75 MG EC  tablet Take 1 tablet (75 mg total) by mouth 2 (two) times daily.  180 tablet  6  . levothyroxine (SYNTHROID, LEVOTHROID) 125 MCG tablet Take 125 mcg by mouth daily before breakfast.      . neomycin-bacitracin-polymyxin (NEOSPORIN) ointment Apply topically every 12 (twelve) hours. Apply right shoulder as needed      . ranitidine (ZANTAC) 150 MG tablet Take 150 mg by mouth 2 (two) times daily.       Marland Kitchen tetrahydrozoline 0.05 % ophthalmic solution Place 2 drops into both eyes as needed. Red eyes      . traMADol (ULTRAM) 50 MG tablet Take 1 tablet (50 mg total) by mouth every 6 (six) hours as needed. PAIN  90 tablet  6  . verapamil (VERELAN PM) 360 MG 24 hr capsule Take 1 capsule (360 mg total) by mouth at bedtime.  90 capsule  1  . verapamil (VERELAN PM) 360 MG 24 hr capsule TAKE ONE CAPSULE ONCE DAILY  90 capsule  1   No current facility-administered medications on file prior to visit.    BP 138/80  Pulse 67  Temp(Src) 98.3 F (36.8 C) (Oral)  Resp 20  Wt 196 lb (88.905 kg)  BMI 32.11 kg/m2  SpO2 96%      Review of Systems  Constitutional: Negative.   HENT: Negative for hearing loss, congestion, sore throat, rhinorrhea, dental problem, sinus pressure and tinnitus.   Eyes: Negative for pain, discharge and visual disturbance.  Respiratory: Negative for cough and shortness of breath.   Cardiovascular: Negative for chest pain, palpitations and leg swelling.  Gastrointestinal: Positive for diarrhea. Negative for nausea, vomiting, abdominal pain, constipation, blood in stool and abdominal distention.  Genitourinary: Negative for dysuria, urgency, frequency, hematuria, flank pain, vaginal bleeding, vaginal discharge, difficulty urinating, vaginal pain and pelvic pain.  Musculoskeletal: Negative for joint swelling, arthralgias and gait problem.  Skin: Negative for rash.  Neurological: Negative for dizziness, syncope, speech difficulty, weakness, numbness and headaches.  Hematological:  Negative for adenopathy.  Psychiatric/Behavioral: Negative for behavioral problems, dysphoric mood and agitation. The patient is not nervous/anxious.        Objective:   Physical Exam  Constitutional: She is oriented to person, place, and time. She appears well-developed and well-nourished.  HENT:  Head: Normocephalic.  Right Ear: External ear normal.  Left Ear: External ear normal.  Mouth/Throat: Oropharynx is clear and moist.  Eyes: Conjunctivae and EOM are normal. Pupils are equal, round, and reactive to light.  Neck: Normal range of motion. Neck supple. No thyromegaly present.  Cardiovascular: Normal rate, regular rhythm, normal heart sounds and intact distal pulses.   Pulmonary/Chest: Effort normal and breath sounds normal.  Abdominal: Soft. Bowel sounds are normal. She exhibits no distension  and no mass. There is no tenderness. There is no rebound and no guarding.  Musculoskeletal: Normal range of motion.  Lymphadenopathy:    She has no cervical adenopathy.  Neurological: She is alert and oriented to person, place, and time.  Skin: Skin is warm and dry. No rash noted.  Psychiatric: She has a normal mood and affect. Her behavior is normal.          Assessment & Plan:   Diarrhea-  Will slowly advance diet; Align for 2 weeks HTN- stable

## 2012-12-18 NOTE — Telephone Encounter (Signed)
Patient Information:  Caller Name: Verne  Phone: [phone]  Patient: Abigail [name], Abigail [name]  Gender: Female  DOB: December 28, 1945  Age: 67 Years  PCP: [facility])  Office Follow Up:  Does the office need to follow up with this patient?: No  Instructions For The Office: N/A  RN Note:  Pt was seen by Dr Amador Cunas on 3-31, advised to take Imodium and start eating.  Pt states Diarrhea started again as soon as she left appt.  Pt took Imudium x4 on 3-31, Pt continued to have x4 episodes of Diarrhea.  Pt asking for something stronger than Imodium to be called in.  Pt denies dizziness, has urinated his AM.  All emergent symptoms ruled out per Diarrhea protocol, go to office now dispo. 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 Discussed clear fluids, urine output and starchy foods w/ diarrhea for easy disgestion.  Pt verbalized understanding.  Appt scheduled w/ Dr Amador Cunas at 1430 on 4-1.   Symptoms  Reason For Call & Symptoms: ER CALL. Follow up Diarrhea after eval on 3-31  Reviewed Health History In EMR: N/A  Reviewed Medications In EMR: N/A  Reviewed Allergies In EMR: N/A  Reviewed Surgeries / Procedures: N/A  Date of Onset of Symptoms: 12/16/2012  Treatments Tried: Imodium  Treatments Tried Worked: No  Guideline(s) Used:  Diarrhea  Disposition Per Guideline:   Go to Office Now  Reason For Disposition Reached:   Age > 60 years and has had > 6 diarrhea stools in past 24 hours  Advice Given:  N/A  Patient Will Follow Care Advice:  YES  Appointment Scheduled:  12/18/2012 16:00:00 Appointment Scheduled Provider:  Eleonore Chiquito (Family Practice)

## 2012-12-27 ENCOUNTER — Other Ambulatory Visit: Payer: Self-pay | Admitting: Internal Medicine

## 2012-12-28 ENCOUNTER — Encounter: Payer: Self-pay | Admitting: Family Medicine

## 2012-12-28 ENCOUNTER — Ambulatory Visit (INDEPENDENT_AMBULATORY_CARE_PROVIDER_SITE_OTHER): Payer: Medicare PPO | Admitting: Family Medicine

## 2012-12-28 VITALS — BP 136/88 | HR 68 | Temp 98.2°F | Wt 196.0 lb

## 2012-12-28 DIAGNOSIS — D692 Other nonthrombocytopenic purpura: Secondary | ICD-10-CM

## 2012-12-28 DIAGNOSIS — E785 Hyperlipidemia, unspecified: Secondary | ICD-10-CM

## 2012-12-28 LAB — POCT URINALYSIS DIPSTICK
Bilirubin, UA: NEGATIVE
Ketones, UA: NEGATIVE
Spec Grav, UA: 1.025
pH, UA: 6

## 2012-12-28 LAB — CBC WITH DIFFERENTIAL/PLATELET
Basophils Relative: 0.6 % (ref 0.0–3.0)
Eosinophils Absolute: 0.1 10*3/uL (ref 0.0–0.7)
HCT: 38.5 % (ref 36.0–46.0)
Hemoglobin: 12.9 g/dL (ref 12.0–15.0)
Lymphocytes Relative: 36.1 % (ref 12.0–46.0)
MCHC: 33.6 g/dL (ref 30.0–36.0)
MCV: 90.4 fl (ref 78.0–100.0)
Monocytes Absolute: 0.5 10*3/uL (ref 0.1–1.0)
Neutro Abs: 2.7 10*3/uL (ref 1.4–7.7)
RBC: 4.25 Mil/uL (ref 3.87–5.11)

## 2012-12-28 LAB — HEPATIC FUNCTION PANEL
AST: 16 U/L (ref 0–37)
Alkaline Phosphatase: 108 U/L (ref 39–117)
Total Bilirubin: 0.4 mg/dL (ref 0.3–1.2)

## 2012-12-28 LAB — BASIC METABOLIC PANEL
GFR: 114.65 mL/min (ref >60.00)
Potassium: 3.6 mEq/L (ref 3.5–5.1)
Sodium: 137 mEq/L (ref 135–145)

## 2012-12-28 LAB — APTT: aPTT: 27.3 s (ref 21.7–28.8)

## 2012-12-28 NOTE — Progress Notes (Signed)
  Subjective:    Patient ID: Abigail [name], female    DOB: 1945/11/16, 67 y.o.   MRN: [id number]  HPI Here for 3 days of bruising over the arms and legs with no recent hx of trauma. No coughing up blood, no nosebleeds, no blood in the urine or stool. She feels fine. She has been on Plavix since 2006 and has never had a problem with bruising. She never takes OTC NSAIDs. She has used Diclofenac in the past but has not used this for several months. No new medications recently.    Review of Systems  Constitutional: Negative.   Respiratory: Negative.   Cardiovascular: Negative.   Gastrointestinal: Negative.   Genitourinary: Negative.   Hematological: Negative for adenopathy. Bruises/bleeds easily.       Objective:   Physical Exam  Constitutional: She is oriented to person, place, and time. She appears well-developed and well-nourished.  Cardiovascular: Normal rate, regular rhythm, normal heart sounds and intact distal pulses.   Pulmonary/Chest: Effort normal and breath sounds normal.  Abdominal: Soft. Bowel sounds are normal. She exhibits no distension and no mass. There is no tenderness. There is no rebound and no guarding.  Neurological: She is alert and oriented to person, place, and time.  Skin:  She has numerous tender ecchymotic areas on the arms and legs. The trunk is clear          Assessment & Plan:  Purpura of unclear etiology. Check labs today.

## 2012-12-31 ENCOUNTER — Telehealth: Payer: Self-pay | Admitting: Internal Medicine

## 2012-12-31 NOTE — Telephone Encounter (Signed)
Pt would like blood work results and refill on fiorcet call into [facility]

## 2012-12-31 NOTE — Telephone Encounter (Signed)
Spoke to pt told her labs look normal but Dr. Kirtland Bouchard has not reviewed them yet will be back tomorrow and was given a printed Rx for Fiorcet and Lomotil at last visit. Pt stated she did not realize will look when she gets home. Told pt okay just let me know. Pt verbalized understanding.

## 2013-01-01 ENCOUNTER — Telehealth: Payer: Self-pay | Admitting: Internal Medicine

## 2013-01-01 NOTE — Telephone Encounter (Addendum)
Pt needs refill of butalbital-acetaminophen-caffeine (FIORICET, ESGIC) 50-325-40 MG per tablet. [facility]. Pt states she does not have this script as thought she might have .

## 2013-01-02 MED ORDER — BUTALBITAL-APAP-CAFFEINE 50-325-40 MG PO TABS: 2.0000 | ORAL_TABLET | Freq: Four times a day (QID) | ORAL | Status: DC | PRN

## 2013-01-02 NOTE — Telephone Encounter (Signed)
Rx for Fioricet called into pharmacy gave verbal order to pharmacist Robin. Pt lost printed Rx.  Pt notified Rx called into pharmacy.

## 2013-01-07 NOTE — Progress Notes (Signed)
Quick Note:  I left voice message with results. ______ 

## 2013-01-17 ENCOUNTER — Encounter: Payer: Self-pay | Admitting: Internal Medicine

## 2013-01-17 ENCOUNTER — Telehealth: Payer: Self-pay | Admitting: Internal Medicine

## 2013-01-17 ENCOUNTER — Ambulatory Visit (INDEPENDENT_AMBULATORY_CARE_PROVIDER_SITE_OTHER): Payer: Medicare PPO | Admitting: Internal Medicine

## 2013-01-17 VITALS — BP 140/80 | HR 68 | Temp 98.7°F | Resp 20 | Wt 199.0 lb

## 2013-01-17 DIAGNOSIS — M545 Low back pain: Secondary | ICD-10-CM

## 2013-01-17 DIAGNOSIS — I10 Essential (primary) hypertension: Secondary | ICD-10-CM

## 2013-01-17 DIAGNOSIS — Z8679 Personal history of other diseases of the circulatory system: Secondary | ICD-10-CM

## 2013-01-17 MED ORDER — METHYLPREDNISOLONE ACETATE 80 MG/ML IJ SUSP
80.0000 mg | Freq: Once | INTRAMUSCULAR | Status: AC
  Administered 2013-01-17: 80 mg via INTRAMUSCULAR

## 2013-01-17 NOTE — Progress Notes (Signed)
Subjective:    Patient ID: Abigail [name], female    DOB: 1946/07/06, 67 y.o.   MRN: [id number]  HPI   67 year old patient who has a long history of the intermittent low back pain. She has been followed by orthopedics. She's had MRIs that revealed degenerative joint disease. For the past 2 or 3 weeks has had increasing low back pain. This has been helped immensely by when necessary diclofenac but this was discontinued due to some excessive traumatic bruising. Bruising has resolved.  Past Medical History  Diagnosis Date  . Hyperlipidemia   . History of colonic polyps   . Late effect of adverse effect of drug, medicinal or biological substance   . History of tension headache   . Heart palpitations   . Diverticulitis 09-05-11    hx. gastritis, diverticulitis x2 -now surgery planned  . Patent foramen ovale     Small  . Hypertension 09-05-11    tx. Verapamil  . Hypothyroidism 09-05-11    Supplement used  . Degenerative joint disease of cervical spine 09-05-11    Cervival area, now some osteoarthritis-lower back and Rt. shoulder  . Osteoarthritis 112-17-12    spine and rt. hip, rt. shoulder  . Sleep apnea 09-05-11    no cpap ever, had surgery to remove cartilage, no problems now  . Stroke 09-05-11    2006/2009-(loss of memory, balance issues remains occ.)  . Cervical lymphadenopathy     Right side  . PONV (postoperative nausea and vomiting)     History   Social History  . Marital Status: Married    Spouse Name: N/A    Number of Children: N/A  . Years of Education: N/A   Occupational History  . Not on file.   Social History Main Topics  . Smoking status: Former Smoker -- 0.50 packs/day for 5 years    Types: Cigarettes    Quit date: 09/19/1968  . Smokeless tobacco: Never Used  . Alcohol Use: Yes     Comment: social -rare  . Drug Use: No  . Sexually Active: Yes   Other Topics Concern  . Not on file   Social History Narrative  . No narrative on file    Past Surgical  History  Procedure Laterality Date  . Abdominal hysterectomy    . Thyroidectomy, partial  09-05-11  . Elbow surgery  09-05-11    left elbow -ligament repair  . Umbilical hernia repair  yrs ago  . Colon resection  09/07/2011    Procedure: LAPAROSCOPIC SIGMOID COLON RESECTION;  Surgeon: Mariella Saa, MD;  Location: [facility];  Service: General;  Laterality: N/A;  with proctoscopy  . Laparoscopy  09/07/2011    Procedure: LAPAROSCOPY DIAGNOSTIC;  Surgeon: Roselle Locus II;  Location: [facility];  Service: Gynecology;  Laterality: N/A;  . Salpingoophorectomy  09/07/2011    Procedure: SALPINGO OOPHERECTOMY;  Surgeon: Roselle Locus II;  Location: [facility];  Service: Gynecology;  Laterality: Right;  . Tubal ligation  1983  . Ventral hernia repair  06/20/2012    Procedure: LAPAROSCOPIC VENTRAL HERNIA;  Surgeon: Mariella Saa, MD;  Location: [facility];  Service: General;  Laterality: N/A;  Laparoscopic Repair of Ventral Hernia with mesh    No family history on file.  Allergies  Allergen Reactions  . Aggrenox (Aspirin-Dipyridamole Er) Nausea And Vomiting and Other (See Comments)    Headache   . Dilaudid (Hydromorphone Hcl) Shortness Of Breath and Nausea And Vomiting  . Shellfish Allergy Shortness Of Breath  and Rash    Only shrimp allergy  . Codeine Phosphate Nausea And Vomiting    REACTION: unspecified  . Hydrocodone Nausea And Vomiting  . Meperidine Hcl Nausea And Vomiting  . Percocet (Oxycodone-Acetaminophen) Nausea And Vomiting  . Cephalexin Itching and Rash    Current Outpatient Prescriptions on File Prior to Visit  Medication Sig Dispense Refill  . atorvastatin (LIPITOR) 40 MG tablet Take 1 tablet (40 mg total) by mouth daily.  90 tablet  3  . butalbital-acetaminophen-caffeine (FIORICET, ESGIC) 50-325-40 MG per tablet Take 2 tablets by mouth every 6 (six) hours as needed for headache.  120 tablet  0  . clopidogrel (PLAVIX) 75 MG tablet Take 75 mg by mouth every evening.      Marland Kitchen  levothyroxine (SYNTHROID, LEVOTHROID) 125 MCG tablet Take 125 mcg by mouth daily before breakfast.      . ranitidine (ZANTAC) 150 MG tablet Take 150 mg by mouth 2 (two) times daily.       Marland Kitchen tetrahydrozoline 0.05 % ophthalmic solution Place 2 drops into both eyes as needed. Red eyes      . traMADol (ULTRAM) 50 MG tablet Take 1 tablet (50 mg total) by mouth every 6 (six) hours as needed. PAIN  90 tablet  6  . verapamil (VERELAN PM) 360 MG 24 hr capsule Take 1 capsule (360 mg total) by mouth at bedtime.  90 capsule  1   No current facility-administered medications on file prior to visit.    BP 140/80  Pulse 68  Temp(Src) 98.7 F (37.1 C) (Oral)  Resp 20  Wt 199 lb (90.266 kg)  BMI 32.6 kg/m2  SpO2 97%       Review of Systems  Constitutional: Negative.   HENT: Negative for hearing loss, congestion, sore throat, rhinorrhea, dental problem, sinus pressure and tinnitus.   Eyes: Negative for pain, discharge and visual disturbance.  Respiratory: Negative for cough and shortness of breath.   Cardiovascular: Negative for chest pain, palpitations and leg swelling.  Gastrointestinal: Negative for nausea, vomiting, abdominal pain, diarrhea, constipation, blood in stool and abdominal distention.  Genitourinary: Negative for dysuria, urgency, frequency, hematuria, flank pain, vaginal bleeding, vaginal discharge, difficulty urinating, vaginal pain and pelvic pain.  Musculoskeletal: Positive for back pain. Negative for joint swelling, arthralgias and gait problem.  Skin: Negative for rash.  Neurological: Negative for dizziness, syncope, speech difficulty, weakness, numbness and headaches.  Hematological: Negative for adenopathy.  Psychiatric/Behavioral: Negative for behavioral problems, dysphoric mood and agitation. The patient is not nervous/anxious.        Objective:   Physical Exam  Constitutional: She is oriented to person, place, and time. She appears well-developed and well-nourished.   HENT:  Head: Normocephalic.  Right Ear: External ear normal.  Left Ear: External ear normal.  Mouth/Throat: Oropharynx is clear and moist.  Eyes: Conjunctivae and EOM are normal. Pupils are equal, round, and reactive to light.  Neck: Normal range of motion. Neck supple. No thyromegaly present.  Cardiovascular: Normal rate, regular rhythm, normal heart sounds and intact distal pulses.   Pulmonary/Chest: Effort normal and breath sounds normal.  Abdominal: Soft. Bowel sounds are normal. She exhibits no mass. There is no tenderness.  Musculoskeletal: Normal range of motion.  Straight leg test normal Normal muscle strength Able to walk on toes and heels   Lymphadenopathy:    She has no cervical adenopathy.  Neurological: She is alert and oriented to person, place, and time.  Skin: Skin is warm and dry. No rash  noted.  Psychiatric: She has a normal mood and affect. Her behavior is normal.          Assessment & Plan:   Recurrent low back pain. Patient treated with the Depo-Medrol. Hypertension stable  Will consider resuming diclofenac in the future if needed. Risks versus benefits of the medication discussed

## 2013-01-17 NOTE — Patient Instructions (Signed)
You  may move around, but avoid painful motions and activities.  Apply ice to the sore area for 15 to 20 minutes 3 or 4 times daily for the next two to 3 days. 

## 2013-01-17 NOTE — Telephone Encounter (Signed)
Patient Information:  Caller Name: Laura-Lee  Phone: [phone]  Patient: Abigail [name], Tolsma  Gender: Female  DOB: 1945/11/25  Age: 67 Years  PCP: Eleon[facility] Chiqu[facility])  Office Follow Up:  Does the office need to follow up with this patient?: No  Instructions For The Office: N/A  RN Note:  Pt has history of degenerative disk disease w/ ongoing problems w/ back pain.  Pt takes care of grand-daughter, 2 mth old.  Pt took Tramadol before bed on 4-30, Pt woke up on 5-1 w/ severe back pain, unable to stand up straight.  Pt wanting to know if there is any other pain meds she could take, Pt is allergic to codene, Pt unable to take Diclofenac.  Appt scheduled for 15:30 on 5-1 due to Pt needs husband to drive her and husband's work schedule conflicts w/ earlier appts.   Symptoms  Reason For Call & Symptoms: ER CALL. Back Pain, onset several years.  Reviewed Health History In EMR: N/A  Reviewed Medications In EMR: N/A  Reviewed Allergies In EMR: N/A  Reviewed Surgeries / Procedures: N/A  Date of Onset of Symptoms: 01/17/2013  Treatments Tried: Tramadol and Fioricet  Treatments Tried Worked: No  Guideline(s) Used:  Back Pain  Disposition Per Guideline:   Go to Office Now  Reason For Disposition Reached:   Severe back pain  Advice Given:  N/A  Patient Will Follow Care Advice:  YES  Appointment Scheduled:  01/17/2013 15:30:00 Appointment Scheduled Provider:  Eleonore Chiquito (Family Practice)

## 2013-01-28 ENCOUNTER — Other Ambulatory Visit: Payer: Self-pay | Admitting: Internal Medicine

## 2013-02-01 ENCOUNTER — Other Ambulatory Visit: Payer: Self-pay | Admitting: Internal Medicine

## 2013-02-01 MED ORDER — BUTALBITAL-APAP-CAFFEINE 50-325-40 MG PO TABS: 2.0000 | ORAL_TABLET | Freq: Four times a day (QID) | ORAL | Status: DC | PRN

## 2013-02-01 NOTE — Telephone Encounter (Signed)
Rx printed, signed and faxed to pharmacy. 

## 2013-03-05 ENCOUNTER — Other Ambulatory Visit: Payer: Self-pay | Admitting: Internal Medicine

## 2013-03-10 ENCOUNTER — Other Ambulatory Visit: Payer: Self-pay | Admitting: Internal Medicine

## 2013-03-22 ENCOUNTER — Other Ambulatory Visit: Payer: Self-pay | Admitting: Internal Medicine

## 2013-04-05 ENCOUNTER — Other Ambulatory Visit: Payer: Self-pay | Admitting: Internal Medicine

## 2013-04-21 ENCOUNTER — Other Ambulatory Visit: Payer: Self-pay | Admitting: Internal Medicine

## 2013-05-07 ENCOUNTER — Other Ambulatory Visit: Payer: Self-pay | Admitting: Internal Medicine

## 2013-06-01 ENCOUNTER — Other Ambulatory Visit: Payer: Self-pay | Admitting: Internal Medicine

## 2013-06-04 ENCOUNTER — Other Ambulatory Visit: Payer: Self-pay | Admitting: Internal Medicine

## 2013-06-06 ENCOUNTER — Other Ambulatory Visit: Payer: Self-pay | Admitting: Internal Medicine

## 2013-06-14 ENCOUNTER — Ambulatory Visit (INDEPENDENT_AMBULATORY_CARE_PROVIDER_SITE_OTHER): Payer: Medicare PPO | Admitting: Internal Medicine

## 2013-06-14 ENCOUNTER — Encounter: Payer: Self-pay | Admitting: Internal Medicine

## 2013-06-14 VITALS — BP 142/80 | HR 65 | Temp 98.1°F | Resp 20 | Wt 193.0 lb

## 2013-06-14 DIAGNOSIS — I10 Essential (primary) hypertension: Secondary | ICD-10-CM

## 2013-06-14 DIAGNOSIS — G5600 Carpal tunnel syndrome, unspecified upper limb: Secondary | ICD-10-CM

## 2013-06-14 DIAGNOSIS — M545 Low back pain: Secondary | ICD-10-CM

## 2013-06-14 DIAGNOSIS — G5602 Carpal tunnel syndrome, left upper limb: Secondary | ICD-10-CM

## 2013-06-14 DIAGNOSIS — Z23 Encounter for immunization: Secondary | ICD-10-CM

## 2013-06-14 MED ORDER — METHYLPREDNISOLONE ACETATE 80 MG/ML IJ SUSP
80.0000 mg | Freq: Once | INTRAMUSCULAR | Status: AC
  Administered 2013-06-14: 80 mg via INTRAMUSCULAR

## 2013-06-14 NOTE — Addendum Note (Signed)
Addended by: Jimmye Norman on: 06/14/2013 12:11 PM   Modules accepted: Orders

## 2013-06-14 NOTE — Progress Notes (Signed)
Subjective:    Patient ID: Abigail [name], female    DOB: 09-20-1945, 67 y.o.   MRN: [id number]  HPI   67 year old patient who has a history of treated hypertension. She also has a history of right-sided carpal tunnel syndrome. Complaints today include a painful left thumb as well as paresthesias of the left hand. She has seen orthopedics in the past. She also has had some left lower back and left hip discomfort that began yesterday after a round trip to [place] and back by car. Analgesics include Fioricet diclofenac and tramadol She has hypertension which has been well-controlled.  Past Medical History  Diagnosis Date  . Hyperlipidemia   . History of colonic polyps   . Late effect of adverse effect of drug, medicinal or biological substance   . History of tension headache   . Heart palpitations   . Diverticulitis 09-05-11    hx. gastritis, diverticulitis x2 -now surgery planned  . Patent foramen ovale     Small  . Hypertension 09-05-11    tx. Verapamil  . Hypothyroidism 09-05-11    Supplement used  . Degenerative joint disease of cervical spine 09-05-11    Cervival area, now some osteoarthritis-lower back and Rt. shoulder  . Osteoarthritis 112-17-12    spine and rt. hip, rt. shoulder  . Sleep apnea 09-05-11    no cpap ever, had surgery to remove cartilage, no problems now  . Stroke 09-05-11    2006/2009-(loss of memory, balance issues remains occ.)  . Cervical lymphadenopathy     Right side  . PONV (postoperative nausea and vomiting)     History   Social History  . Marital Status: Married    Spouse Name: N/A    Number of Children: N/A  . Years of Education: N/A   Occupational History  . Not on file.   Social History Main Topics  . Smoking status: Former Smoker -- 0.50 packs/day for 5 years    Types: Cigarettes    Quit date: 09/19/1968  . Smokeless tobacco: Never Used  . Alcohol Use: Yes     Comment: social -rare  . Drug Use: No  . Sexual Activity: Yes   Other  Topics Concern  . Not on file   Social History Narrative  . No narrative on file    Past Surgical History  Procedure Laterality Date  . Abdominal hysterectomy    . Thyroidectomy, partial  09-05-11  . Elbow surgery  09-05-11    left elbow -ligament repair  . Umbilical hernia repair  yrs ago  . Colon resection  09/07/2011    Procedure: LAPAROSCOPIC SIGMOID COLON RESECTION;  Surgeon: Mariella Saa, MD;  Location: [facility];  Service: General;  Laterality: N/A;  with proctoscopy  . Laparoscopy  09/07/2011    Procedure: LAPAROSCOPY DIAGNOSTIC;  Surgeon: Roselle Locus II;  Location: [facility];  Service: Gynecology;  Laterality: N/A;  . Salpingoophorectomy  09/07/2011    Procedure: SALPINGO OOPHERECTOMY;  Surgeon: Roselle Locus II;  Location: [facility];  Service: Gynecology;  Laterality: Right;  . Tubal ligation  1983  . Ventral hernia repair  06/20/2012    Procedure: LAPAROSCOPIC VENTRAL HERNIA;  Surgeon: Mariella Saa, MD;  Location: [facility];  Service: General;  Laterality: N/A;  Laparoscopic Repair of Ventral Hernia with mesh    No family history on file.  Allergies  Allergen Reactions  . Aggrenox [Aspirin-Dipyridamole Er] Nausea And Vomiting and Other (See Comments)    Headache   .  Dilaudid [Hydromorphone Hcl] Shortness Of Breath and Nausea And Vomiting  . Shellfish Allergy Shortness Of Breath and Rash    Only shrimp allergy  . Codeine Phosphate Nausea And Vomiting    REACTION: unspecified  . Hydrocodone Nausea And Vomiting  . Meperidine Hcl Nausea And Vomiting  . Percocet [Oxycodone-Acetaminophen] Nausea And Vomiting  . Cephalexin Itching and Rash    Current Outpatient Prescriptions on File Prior to Visit  Medication Sig Dispense Refill  . atorvastatin (LIPITOR) 40 MG tablet Take 1 tablet (40 mg total) by mouth daily.  90 tablet  3  . butalbital-acetaminophen-caffeine (FIORICET, ESGIC) 50-325-40 MG per tablet TAKE 2 TABLETS BY MOUTH EVERY 6 HOURS AS NEEDED  120 tablet   1  . clopidogrel (PLAVIX) 75 MG tablet TAKE 1 TABLET BY MOUTH ONCE A DAY  90 tablet  3  . diclofenac (VOLTAREN) 75 MG EC tablet TAKE 1 TABLET BY MOUTH TWICE A DAY  180 tablet  0  . levothyroxine (SYNTHROID, LEVOTHROID) 125 MCG tablet Take 125 mcg by mouth daily before breakfast.      . ranitidine (ZANTAC) 150 MG tablet Take 150 mg by mouth 2 (two) times daily.       Marland Kitchen tetrahydrozoline 0.05 % ophthalmic solution Place 2 drops into both eyes as needed. Red eyes      . traMADol (ULTRAM) 50 MG tablet TAKE 1 TABLET BY MOUTH EVERY 6 HOURS AS NEEDED FOR PAIN  90 tablet  2  . verapamil (VERELAN PM) 360 MG 24 hr capsule Take 1 capsule (360 mg total) by mouth at bedtime.  90 capsule  1   No current facility-administered medications on file prior to visit.    BP 142/80  Pulse 65  Temp(Src) 98.1 F (36.7 C) (Oral)  Resp 20  Wt 193 lb (87.544 kg)  BMI 31.62 kg/m2  SpO2 98%       Review of Systems  Constitutional: Negative.   HENT: Negative for hearing loss, congestion, sore throat, rhinorrhea, dental problem, sinus pressure and tinnitus.   Eyes: Negative for pain, discharge and visual disturbance.  Respiratory: Negative for cough and shortness of breath.   Cardiovascular: Negative for chest pain, palpitations and leg swelling.  Gastrointestinal: Negative for nausea, vomiting, abdominal pain, diarrhea, constipation, blood in stool and abdominal distention.  Genitourinary: Negative for dysuria, urgency, frequency, hematuria, flank pain, vaginal bleeding, vaginal discharge, difficulty urinating, vaginal pain and pelvic pain.  Musculoskeletal: Positive for back pain. Negative for joint swelling, arthralgias and gait problem.  Skin: Negative for rash.  Neurological: Positive for numbness. Negative for dizziness, syncope, speech difficulty, weakness and headaches.  Hematological: Negative for adenopathy.  Psychiatric/Behavioral: Negative for behavioral problems, dysphoric mood and agitation. The  patient is not nervous/anxious.        Objective:   Physical Exam  Constitutional: She appears well-developed and well-nourished. No distress.  Blood pressure well controlled  Musculoskeletal:  Negative canals Negative straight leg test Full range of motion left hip          Assessment & Plan:   Hypertension stable Left lumbar and left hip pain. Osteoarthritis and probable mild left hip bursitis. Will continue rest anti-inflammatories. We'll treat with Depo-Medrol 80. We'll continue analgesics as needed Probable carpal tunnel syndrome left hand. A wrist splint was discussed

## 2013-06-14 NOTE — Patient Instructions (Signed)

## 2013-06-19 ENCOUNTER — Other Ambulatory Visit: Payer: Self-pay

## 2013-06-19 DIAGNOSIS — Z1231 Encounter for screening mammogram for malignant neoplasm of breast: Secondary | ICD-10-CM

## 2013-06-19 DIAGNOSIS — Z803 Family history of malignant neoplasm of breast: Secondary | ICD-10-CM

## 2013-06-21 ENCOUNTER — Ambulatory Visit
Admission: RE | Admit: 2013-06-21 | Discharge: 2013-06-21 | Disposition: A | Discharge status: Home / Self Care | Payer: Medicare PPO | Source: Ambulatory Visit

## 2013-06-21 DIAGNOSIS — Z1231 Encounter for screening mammogram for malignant neoplasm of breast: Secondary | ICD-10-CM

## 2013-06-21 DIAGNOSIS — Z803 Family history of malignant neoplasm of breast: Secondary | ICD-10-CM

## 2013-06-25 ENCOUNTER — Other Ambulatory Visit: Payer: Self-pay | Admitting: Gynecology

## 2013-06-25 DIAGNOSIS — R928 Other abnormal and inconclusive findings on diagnostic imaging of breast: Secondary | ICD-10-CM

## 2013-07-01 ENCOUNTER — Telehealth: Payer: Self-pay | Admitting: Internal Medicine

## 2013-07-01 ENCOUNTER — Other Ambulatory Visit: Payer: Self-pay | Admitting: Internal Medicine

## 2013-07-01 MED ORDER — LEVOTHYROXINE SODIUM 125 MCG PO TABS: 125.0000 ug | ORAL_TABLET | Freq: Every day | ORAL | Status: DC

## 2013-07-01 MED ORDER — VERAPAMIL HCL ER 360 MG PO CP24: 360.0000 mg | ORAL_CAPSULE | Freq: Every day | ORAL | Status: DC

## 2013-07-01 NOTE — Telephone Encounter (Signed)
Pt notified Rx's sent to pharmacy as requested. 

## 2013-07-01 NOTE — Telephone Encounter (Signed)
Pt is calling is calling to request a refill of her levothyroxine (SYNTHROID, LEVOTHROID) 125 MCG tablet, and verapamil (VERELAN PM) 360 MG 24 hr capsule. She would like them called into [facility] in [place], [place].phone number; [phone]

## 2013-07-09 ENCOUNTER — Ambulatory Visit
Admission: RE | Admit: 2013-07-09 | Discharge: 2013-07-09 | Disposition: A | Discharge status: Home / Self Care | Payer: Medicare PPO | Source: Ambulatory Visit | Attending: Gynecology | Admitting: Gynecology

## 2013-07-09 DIAGNOSIS — R928 Other abnormal and inconclusive findings on diagnostic imaging of breast: Secondary | ICD-10-CM

## 2013-07-12 ENCOUNTER — Telehealth (INDEPENDENT_AMBULATORY_CARE_PROVIDER_SITE_OTHER): Payer: Self-pay

## 2013-07-12 NOTE — Telephone Encounter (Signed)
Message copied by Maryan Puls on Fri Jul 12, 2013  2:27 PM ------      Message from: Rise Paganini      Created: Fri Jul 05, 2013  9:18 AM      Regarding: [place]      Contact: [phone]       Patient stated that she has a question about hemorrhoids. Please call. Thank you. ------

## 2013-07-12 NOTE — Telephone Encounter (Signed)
Spoke to Ms. Silliman regarding message.  Ms. Correll req'd her son an earlier appointment with Dr. Johna Sheriff if available due to the discomfort he's having.  Appointment has been rescheduled to 07/18/13 @ 8:45 am w/Dr. Johna Sheriff.

## 2013-08-02 ENCOUNTER — Other Ambulatory Visit: Payer: Self-pay | Admitting: Internal Medicine

## 2013-08-06 NOTE — Telephone Encounter (Signed)
Pt is out °

## 2013-08-07 ENCOUNTER — Other Ambulatory Visit: Payer: Self-pay | Admitting: *Deleted

## 2013-08-07 MED ORDER — BUTALBITAL-APAP-CAFFEINE 50-325-40 MG PO TABS: ORAL_TABLET | ORAL | Status: DC

## 2013-08-07 NOTE — Telephone Encounter (Signed)
Pt stopped by office for refill on Fioricet Rx printed and signed given to pt.

## 2013-08-31 ENCOUNTER — Other Ambulatory Visit: Payer: Self-pay | Admitting: Internal Medicine

## 2013-09-09 ENCOUNTER — Ambulatory Visit (INDEPENDENT_AMBULATORY_CARE_PROVIDER_SITE_OTHER): Payer: Medicare PPO | Admitting: Internal Medicine

## 2013-09-09 ENCOUNTER — Encounter: Payer: Self-pay | Admitting: Internal Medicine

## 2013-09-09 VITALS — BP 110/80 | Temp 98.6°F | Wt 188.0 lb

## 2013-09-09 DIAGNOSIS — M545 Low back pain: Secondary | ICD-10-CM

## 2013-09-09 DIAGNOSIS — I10 Essential (primary) hypertension: Secondary | ICD-10-CM

## 2013-09-09 NOTE — Progress Notes (Signed)
Pre visit review using our clinic review tool, if applicable. No additional management support is needed unless otherwise documented below in the visit note. 

## 2013-09-10 ENCOUNTER — Encounter: Payer: Self-pay | Admitting: Internal Medicine

## 2013-09-10 NOTE — Patient Instructions (Signed)
Call or return to clinic prn if these symptoms worsen or fail to improve as anticipated.

## 2013-09-10 NOTE — Progress Notes (Signed)
Subjective:    Patient ID: Abigail [name], female    DOB: [birth year], 67 y.o.   MRN: [id number]  HPI Pre-visit discussion using our clinic review tool. No additional management support is needed unless otherwise documented below in the visit note.  67 year old patient who has a history of treated hypertension. She presents with a three-day history of low back pain this started after spending hours trimming her Christmas tree she is somewhat improved today. Initially the pain was in the right lumbar region but now has migrated to the left lower hip and back area. She has a history of dyslipidemia and due to concerns about diabetes has self discontinued Lipitor. Risks and benefits of the medication discussed at length and encouraged to resume  Past Medical History  Diagnosis Date  . Hyperlipidemia   . History of colonic polyps   . Late effect of adverse effect of drug, medicinal or biological substance   . History of tension headache   . Heart palpitations   . Diverticulitis 09-05-11    hx. gastritis, diverticulitis x2 -now surgery planned  . Patent foramen ovale     Small  . Hypertension 09-05-11    tx. Verapamil  . Hypothyroidism 09-05-11    Supplement used  . Degenerative joint disease of cervical spine 09-05-11    Cervival area, now some osteoarthritis-lower back and Rt. shoulder  . Osteoarthritis 112-17-12    spine and rt. hip, rt. shoulder  . Sleep apnea 09-05-11    no cpap ever, had surgery to remove cartilage, no problems now  . Stroke 09-05-11    2006/2009-(loss of memory, balance issues remains occ.)  . Cervical lymphadenopathy     Right side  . PONV (postoperative nausea and vomiting)     History   Social History  . Marital Status: Married    Spouse Name: N/A    Number of Children: N/A  . Years of Education: N/A   Occupational History  . Not on file.   Social History Main Topics  . Smoking status: Former Smoker -- 0.50 packs/day for 5 years    Types: Cigarettes      Quit date: 09/19/1968  . Smokeless tobacco: Never Used  . Alcohol Use: Yes     Comment: social -rare  . Drug Use: No  . Sexual Activity: Yes   Other Topics Concern  . Not on file   Social History Narrative  . No narrative on file    Past Surgical History  Procedure Laterality Date  . Abdominal hysterectomy    . Thyroidectomy, partial  09-05-11  . Elbow surgery  09-05-11    left elbow -ligament repair  . Umbilical hernia repair  yrs ago  . Colon resection  09/07/2011    Procedure: LAPAROSCOPIC SIGMOID COLON RESECTION;  Surgeon: Mariella Saa, MD;  Location: [facility];  Service: General;  Laterality: N/A;  with proctoscopy  . Laparoscopy  09/07/2011    Procedure: LAPAROSCOPY DIAGNOSTIC;  Surgeon: Roselle Locus II;  Location: [facility];  Service: Gynecology;  Laterality: N/A;  . Salpingoophorectomy  09/07/2011    Procedure: SALPINGO OOPHERECTOMY;  Surgeon: Roselle Locus II;  Location: [facility];  Service: Gynecology;  Laterality: Right;  . Tubal ligation  1983  . Ventral hernia repair  06/20/2012    Procedure: LAPAROSCOPIC VENTRAL HERNIA;  Surgeon: Mariella Saa, MD;  Location: [facility];  Service: General;  Laterality: N/A;  Laparoscopic Repair of Ventral Hernia with mesh    No family history  on file.  Allergies  Allergen Reactions  . Aggrenox [Aspirin-Dipyridamole Er] Nausea And Vomiting and Other (See Comments)    Headache   . Dilaudid [Hydromorphone Hcl] Shortness Of Breath and Nausea And Vomiting  . Shellfish Allergy Shortness Of Breath and Rash    Only shrimp allergy  . Codeine Phosphate Nausea And Vomiting    REACTION: unspecified  . Hydrocodone Nausea And Vomiting  . Meperidine Hcl Nausea And Vomiting  . Percocet [Oxycodone-Acetaminophen] Nausea And Vomiting  . Cephalexin Itching and Rash    Current Outpatient Prescriptions on File Prior to Visit  Medication Sig Dispense Refill  . butalbital-acetaminophen-caffeine (FIORICET, ESGIC) 50-325-40 MG per  tablet TAKE 2 TABLETS BY MOUTH EVERY 6 HOURS AS NEEDED  120 tablet  2  . clopidogrel (PLAVIX) 75 MG tablet TAKE 1 TABLET BY MOUTH ONCE A DAY  90 tablet  3  . diclofenac (VOLTAREN) 75 MG EC tablet TAKE 1 TABLET BY MOUTH TWICE A DAY  180 tablet  0  . levothyroxine (SYNTHROID, LEVOTHROID) 125 MCG tablet Take 1 tablet (125 mcg total) by mouth daily before breakfast.  90 tablet  3  . ranitidine (ZANTAC) 150 MG tablet Take 150 mg by mouth 2 (two) times daily.       Marland Kitchen tetrahydrozoline 0.05 % ophthalmic solution Place 2 drops into both eyes as needed. Red eyes      . traMADol (ULTRAM) 50 MG tablet TAKE 1 TABLET BY MOUTH EVERY 6 HOURS AS NEEDED FOR PAIN  90 tablet  2  . verapamil (VERELAN PM) 360 MG 24 hr capsule Take 1 capsule (360 mg total) by mouth at bedtime.  90 capsule  1  . atorvastatin (LIPITOR) 40 MG tablet TAKE 1 TABLET BY MOUTH EVERY DAY  90 tablet  1   No current facility-administered medications on file prior to visit.    BP 110/80  Temp(Src) 98.6 F (37 C) (Oral)  Wt 188 lb (85.276 kg)       Review of Systems  Musculoskeletal: Positive for back pain.       Objective:   Physical Exam  Constitutional: She appears well-developed and well-nourished. No distress.  Musculoskeletal: Normal range of motion.  Negative straight leg test Full range of motion both hips No focal tenderness or spasm noted on exam          Assessment & Plan:   Low back strain secondary to overuse. We'll continue symptomatic treatment and warm compresses Hypertension stable Dyslipidemia. Will continue Lipitor

## 2013-09-23 ENCOUNTER — Telehealth: Payer: Self-pay | Admitting: Internal Medicine

## 2013-09-23 NOTE — Telephone Encounter (Signed)
Pt is requesting a

## 2013-09-30 ENCOUNTER — Encounter: Payer: Self-pay | Admitting: Internal Medicine

## 2013-09-30 ENCOUNTER — Ambulatory Visit (INDEPENDENT_AMBULATORY_CARE_PROVIDER_SITE_OTHER): Payer: Medicare PPO | Admitting: Internal Medicine

## 2013-09-30 VITALS — BP 146/90 | HR 62 | Temp 98.7°F | Resp 20 | Ht 65.5 in | Wt 192.0 lb

## 2013-09-30 DIAGNOSIS — H8110 Benign paroxysmal vertigo, unspecified ear: Secondary | ICD-10-CM

## 2013-09-30 DIAGNOSIS — I10 Essential (primary) hypertension: Secondary | ICD-10-CM

## 2013-09-30 MED ORDER — MECLIZINE HCL 25 MG PO TABS: 25.0000 mg | ORAL_TABLET | Freq: Three times a day (TID) | ORAL | Status: DC | PRN

## 2013-09-30 NOTE — Progress Notes (Signed)
Pre-visit discussion using our clinic review tool. No additional management support is needed unless otherwise documented below in the visit note.  

## 2013-09-30 NOTE — Patient Instructions (Addendum)
Perform  repositioning exercises as discussed   Call or return to clinic prn if these symptoms worsen or fail to improve as anticipated. Benign Positional Vertigo Vertigo means you feel like you or your surroundings are moving when they are not. Benign positional vertigo is the most common form of vertigo. Benign means that the cause of your condition is not serious. Benign positional vertigo is more common in older adults. CAUSES  Benign positional vertigo is the result of an upset in the labyrinth system. This is an area in the middle ear that helps control your balance. This may be caused by a viral infection, head injury, or repetitive motion. However, often no specific cause is found. SYMPTOMS  Symptoms of benign positional vertigo occur when you move your head or eyes in different directions. Some of the symptoms may include:  Loss of balance and falls.  Vomiting.  Blurred vision.  Dizziness.  Nausea.  Involuntary eye movements (nystagmus). DIAGNOSIS  Benign positional vertigo is usually diagnosed by physical exam. If the specific cause of your benign positional vertigo is unknown, your caregiver may perform imaging tests, such as magnetic resonance imaging (MRI) or computed tomography (CT). TREATMENT  Your caregiver may recommend movements or procedures to correct the benign positional vertigo. Medicines such as meclizine, benzodiazepines, and medicines for nausea may be used to treat your symptoms. In rare cases, if your symptoms are caused by certain conditions that affect the inner ear, you may need surgery. HOME CARE INSTRUCTIONS   Follow your caregiver's instructions.  Move slowly. Do not make sudden body or head movements.  Avoid driving.  Avoid operating heavy machinery.  Avoid performing any tasks that would be dangerous to you or others during a vertigo episode.  Drink enough fluids to keep your urine clear or pale yellow. SEEK IMMEDIATE MEDICAL CARE IF:   You  develop problems with walking, weakness, numbness, or using your arms, hands, or legs.  You have difficulty speaking.  You develop severe headaches.  Your nausea or vomiting continues or gets worse.  You develop visual changes.  Your family or friends notice any behavioral changes.  Your condition gets worse.  You have a fever.  You develop a stiff neck or sensitivity to light. MAKE SURE YOU:   Understand these instructions.  Will watch your condition.  Will get help right away if you are not doing well or get worse. Document Released: 06/13/2006 Document Revised: 11/28/2011 Document Reviewed: 05/26/2011 [facility] Patient Information 2014 [place], [place].

## 2013-09-30 NOTE — Progress Notes (Signed)
Subjective:    Patient ID: Abigail [name], female    DOB: 1945/11/12, 68 y.o.   MRN: [id number]  HPI  68 year old patient who presents with a two-week history of intermittent vertigo. Symptoms are worse with the [facility] on her right side. She has hypertension and a history of chronic headaches. No significant URI symptoms. She has had vertigo in the past but not in greater than one year. No focal neurological symptoms  Past Medical History  Diagnosis Date  . Hyperlipidemia   . History of colonic polyps   . Late effect of adverse effect of drug, medicinal or biological substance   . History of tension headache   . Heart palpitations   . Diverticulitis 09-05-11    hx. gastritis, diverticulitis x2 -now surgery planned  . Patent foramen ovale     Small  . Hypertension 09-05-11    tx. Verapamil  . Hypothyroidism 09-05-11    Supplement used  . Degenerative joint disease of cervical spine 09-05-11    Cervival area, now some osteoarthritis-lower back and Rt. shoulder  . Osteoarthritis 112-17-12    spine and rt. hip, rt. shoulder  . Sleep apnea 09-05-11    no cpap ever, had surgery to remove cartilage, no problems now  . Stroke 09-05-11    2006/2009-(loss of memory, balance issues remains occ.)  . Cervical lymphadenopathy     Right side  . PONV (postoperative nausea and vomiting)     History   Social History  . Marital Status: Married    Spouse Name: N/A    Number of Children: N/A  . Years of Education: N/A   Occupational History  . Not on file.   Social History Main Topics  . Smoking status: Former Smoker -- 0.50 packs/day for 5 years    Types: Cigarettes    Quit date: 09/19/1968  . Smokeless tobacco: Never Used  . Alcohol Use: Yes     Comment: social -rare  . Drug Use: No  . Sexual Activity: Yes   Other Topics Concern  . Not on file   Social History Narrative  . No narrative on file    Past Surgical History  Procedure Laterality Date  . Abdominal hysterectomy    .  Thyroidectomy, partial  09-05-11  . Elbow surgery  09-05-11    left elbow -ligament repair  . Umbilical hernia repair  yrs ago  . Colon resection  09/07/2011    Procedure: LAPAROSCOPIC SIGMOID COLON RESECTION;  Surgeon: Mariella Saa, MD;  Location: [facility];  Service: General;  Laterality: N/A;  with proctoscopy  . Laparoscopy  09/07/2011    Procedure: LAPAROSCOPY DIAGNOSTIC;  Surgeon: Roselle Locus II;  Location: [facility];  Service: Gynecology;  Laterality: N/A;  . Salpingoophorectomy  09/07/2011    Procedure: SALPINGO OOPHERECTOMY;  Surgeon: Roselle Locus II;  Location: [facility];  Service: Gynecology;  Laterality: Right;  . Tubal ligation  1983  . Ventral hernia repair  06/20/2012    Procedure: LAPAROSCOPIC VENTRAL HERNIA;  Surgeon: Mariella Saa, MD;  Location: [facility];  Service: General;  Laterality: N/A;  Laparoscopic Repair of Ventral Hernia with mesh    No family history on file.  Allergies  Allergen Reactions  . Aggrenox [Aspirin-Dipyridamole Er] Nausea And Vomiting and Other (See Comments)    Headache   . Dilaudid [Hydromorphone Hcl] Shortness Of Breath and Nausea And Vomiting  . Shellfish Allergy Shortness Of Breath and Rash    Only shrimp allergy  .  Codeine Phosphate Nausea And Vomiting    REACTION: unspecified  . Hydrocodone Nausea And Vomiting  . Meperidine Hcl Nausea And Vomiting  . Percocet [Oxycodone-Acetaminophen] Nausea And Vomiting  . Cephalexin Itching and Rash    Current Outpatient Prescriptions on File Prior to Visit  Medication Sig Dispense Refill  . atorvastatin (LIPITOR) 40 MG tablet TAKE 1 TABLET BY MOUTH EVERY DAY  90 tablet  1  . butalbital-acetaminophen-caffeine (FIORICET, ESGIC) 50-325-40 MG per tablet TAKE 2 TABLETS BY MOUTH EVERY 6 HOURS AS NEEDED  120 tablet  2  . clopidogrel (PLAVIX) 75 MG tablet TAKE 1 TABLET BY MOUTH ONCE A DAY  90 tablet  3  . diclofenac (VOLTAREN) 75 MG EC tablet TAKE 1 TABLET BY MOUTH TWICE A DAY  180 tablet  0  .  levothyroxine (SYNTHROID, LEVOTHROID) 125 MCG tablet Take 1 tablet (125 mcg total) by mouth daily before breakfast.  90 tablet  3  . tetrahydrozoline 0.05 % ophthalmic solution Place 2 drops into both eyes as needed. Red eyes      . traMADol (ULTRAM) 50 MG tablet TAKE 1 TABLET BY MOUTH EVERY 6 HOURS AS NEEDED FOR PAIN  90 tablet  2  . verapamil (VERELAN PM) 360 MG 24 hr capsule Take 1 capsule (360 mg total) by mouth at bedtime.  90 capsule  1   No current facility-administered medications on file prior to visit.    BP 146/90  Pulse 62  Temp(Src) 98.7 F (37.1 C) (Oral)  Resp 20  Ht 5' 5.5" (1.664 m)  Wt 192 lb (87.091 kg)  BMI 31.45 kg/m2  SpO2 98%       Review of Systems  Constitutional: Negative.   HENT: Negative for congestion, dental problem, hearing loss, rhinorrhea, sinus pressure, sore throat and tinnitus.   Eyes: Negative for pain, discharge and visual disturbance.  Respiratory: Negative for cough and shortness of breath.   Cardiovascular: Negative for chest pain, palpitations and leg swelling.  Gastrointestinal: Negative for nausea, vomiting, abdominal pain, diarrhea, constipation, blood in stool and abdominal distention.  Genitourinary: Negative for dysuria, urgency, frequency, hematuria, flank pain, vaginal bleeding, vaginal discharge, difficulty urinating, vaginal pain and pelvic pain.  Musculoskeletal: Negative for arthralgias, gait problem and joint swelling.  Skin: Negative for rash.  Neurological: Positive for dizziness and headaches. Negative for syncope, speech difficulty, weakness and numbness.  Hematological: Negative for adenopathy.  Psychiatric/Behavioral: Negative for behavioral problems, dysphoric mood and agitation. The patient is not nervous/anxious.        Objective:   Physical Exam  Constitutional: She is oriented to person, place, and time. She appears well-developed and well-nourished. No distress.  Repeat blood pressure 130/80  HENT:  Head:  Normocephalic.  Right Ear: External ear normal.  Left Ear: External ear normal.  Mouth/Throat: Oropharynx is clear and moist.  Tympanic membranes normal  Eyes: Conjunctivae and EOM are normal. Pupils are equal, round, and reactive to light.  No pathological nystagmus  Neck: Normal range of motion. Neck supple. No thyromegaly present.  Cardiovascular: Normal rate, regular rhythm, normal heart sounds and intact distal pulses.   Pulmonary/Chest: Effort normal and breath sounds normal.  Abdominal: Soft. Bowel sounds are normal. She exhibits no mass. There is no tenderness.  Musculoskeletal: Normal range of motion.  Lymphadenopathy:    She has no cervical adenopathy.  Neurological: She is alert and oriented to person, place, and time. She has normal reflexes.  Finger to nose testing normal No drift  Skin: Skin is warm  and dry. No rash noted.  Psychiatric: She has a normal mood and affect. Her behavior is normal.          Assessment & Plan:   Benign positional vertigo. We'll treat with meclizine. She was given instructions for repositioning maneuvers. She will call if unimproved Hypertension stable

## 2013-10-01 ENCOUNTER — Telehealth: Payer: Self-pay | Admitting: Internal Medicine

## 2013-10-01 NOTE — Telephone Encounter (Signed)
Relevant patient education assigned to patient using Emmi. ° °

## 2013-10-17 ENCOUNTER — Other Ambulatory Visit: Payer: Self-pay | Admitting: Internal Medicine

## 2013-11-06 ENCOUNTER — Telehealth (INDEPENDENT_AMBULATORY_CARE_PROVIDER_SITE_OTHER): Payer: Self-pay

## 2013-11-06 NOTE — Telephone Encounter (Signed)
Patient states she thinks she has a incisional hernia, she states  was offered a appointment for April but she feels she needs to be seen sooner, Spoke with Neysa BonitoChristy , she advised for her to come 11-13-13 @245p . Appt scheduled at that time and advised her if her pain become worse for her to go to the ED. Patient verbalized understanding

## 2013-11-13 ENCOUNTER — Encounter (INDEPENDENT_AMBULATORY_CARE_PROVIDER_SITE_OTHER): Payer: Self-pay | Admitting: General Surgery

## 2013-11-13 ENCOUNTER — Other Ambulatory Visit (INDEPENDENT_AMBULATORY_CARE_PROVIDER_SITE_OTHER): Payer: Self-pay | Admitting: General Surgery

## 2013-11-13 ENCOUNTER — Ambulatory Visit (INDEPENDENT_AMBULATORY_CARE_PROVIDER_SITE_OTHER): Payer: Medicare PPO | Admitting: General Surgery

## 2013-11-13 VITALS — BP 125/87 | HR 72 | Temp 98.3°F | Resp 16 | Ht 65.5 in | Wt 190.2 lb

## 2013-11-13 DIAGNOSIS — K432 Incisional hernia without obstruction or gangrene: Secondary | ICD-10-CM

## 2013-11-13 NOTE — Patient Instructions (Signed)
Our office will call you probably next week to schedule her surgery  Laparoscopic Ventral Hernia Repair Laparoscopic ventral hernia repairis a surgery to fix a ventral hernia. Aventral hernia, also called an incisional hernia, is a bulge of body tissue or intestines that pushes through the front part of the abdomen. This can happen if the connective tissue covering the muscles over the abdomen has a weak spot or is torn because of a surgical cut (incision) from a previous surgery. Laparoscopic ventral hernia repair is often done soon after diagnosis to stop the hernia from getting bigger, becoming uncomfortable, or becoming an emergency. This surgery usually takes about 2 hours, but the time can vary greatly. LET YOUR CAREGIVER KNOW ABOUT:  Any allergies you have.  All medicines you are taking, including steroids, vitamins, herbs, eyedrops, and over-the-counter medicines and creams.  Previous problems you or members of your family have had with the use of anesthetics.  Any blood disorders or bleeding problems you have had.  Past surgeries you have had.  Other health problems you have. RISKS AND COMPLICATIONS  Generally, laparoscopic ventral hernia repair is a safe procedure. However, as with any surgical procedure, complications can occur. Possible complications include:  Bleeding.  Trouble passing urine or having a bowel movement after the operation.  Infection.  Pneumonia.  Blood clots.  Pain in the area of the hernia.  A bulge in the area of the hernia that may be caused by a collection of fluid.  Injury to intestines or other structures in the abdomen.  Return of the hernia after surgery. In some cases, the caregiver may need to stop the laparoscopic procedure and do regular, open surgery. This may be necessary for very difficult hernias, when organs are hard to see, or when bleeding problems occur during surgery. BEFORE THE PROCEDURE   You may need to have blood tests,  urine tests, a chest X-ray, or electrocardiography done before the day of the surgery.  Ask your caregiver about changing or stopping your regular medicines.  You may need to wash with a special type of germ-killing soap.  Do not eat or drink anything for at least 6 hours before the surgery.  Make plans to have someone drive you home after the procedure. PROCEDURE   Small monitors will be put on your body. They are used to check your heart, blood pressure, and oxygen level.  An intravenous (IV) access tube will be put into a vein in your hand or arm. Fluids and medicine will flow directly into your body through the IV tube.  You will be given medicine to make you sleep through the procedure (general anesthetic).  Your abdomen will be cleaned with a special soap to kill any germs on your skin.  Once you are asleep, several small incisions will be made in your abdomen.  The large space in your abdomen will be filled with air so that it expands. This gives the caregiver more room and a better view.  A thin, lighted tube with a tiny camera on the end (laparoscope) is put through a small incision in your abdomen. The camera on the laparoscope sends a picture to a TV screen in the operating room. This gives the caregiver a good view inside the abdomen.  Hollow tubes are put through the other small incisions in your abdomen. The tools needed for the procedure are put through these tubes.  The caregiver puts the tissue or intestines that formed the hernia back in place.  A screen-like patch (mesh) is used to close the hernia. This helps make the area stronger. Stitches, tacks, or staples are used to keep the mesh in place.  Medicine and a bandage (dressing) or skin glue will be put over the incisions. AFTER THE PROCEDURE   You will stay in a recovery area until the anesthetic wears off. Your blood pressure and pulse will be checked often.  You may be able to go home the same day or may  need to stay in the hospital for 1 or 2 days after this surgery. Your caregiver will decide when you can go home.  You may feel some pain. You will likely be given medicine for pain.  You will be urged to do breathing exercises that involve taking deep breaths. This helps prevent a lung infection after a surgery.  You may have to wear compression stockings while you are in the hospital. These stockings help keep blood clots from forming in your legs. Document Released: 08/22/2012 Document Reviewed: 08/22/2012 [facility] Patient Information 2014 [employer], [place].

## 2013-11-13 NOTE — Progress Notes (Signed)
Subjective:   pain and bulge in the upper abdomen  Patient ID: Abigail [name], female   DOB: 07/12/1946, 68 y.o.   MRN: [id number]  HPI Patient is a 68 year old female well known to me with history of laparoscopic sigmoid colectomy for recurrent symptomatic diverticulitis. She then subsequently developed an incisional hernia at her small infraumbilical extraction site and underwent laparoscopic repair about 1-1/2 years ago. About 3 months ago she noted somewhat of a bulge above her umbilicus but it was not painful. About a month ago she had some significant pain over several days which now has gradually improved although it is still sore. She has noted a gradual increase in the size of the bulge. No nausea or vomiting.  Past Medical History  Diagnosis Date  . Hyperlipidemia   . History of colonic polyps   . Late effect of adverse effect of drug, medicinal or biological substance   . History of tension headache   . Heart palpitations   . Diverticulitis 09-05-11    hx. gastritis, diverticulitis x2 -now surgery planned  . Patent foramen ovale     Small  . Hypertension 09-05-11    tx. Verapamil  . Hypothyroidism 09-05-11    Supplement used  . Degenerative joint disease of cervical spine 09-05-11    Cervival area, now some osteoarthritis-lower back and Rt. shoulder  . Osteoarthritis 112-17-12    spine and rt. hip, rt. shoulder  . Sleep apnea 09-05-11    no cpap ever, had surgery to remove cartilage, no problems now  . Stroke 09-05-11    2006/2009-(loss of memory, balance issues remains occ.)  . Cervical lymphadenopathy     Right side  . PONV (postoperative nausea and vomiting)    Past Surgical History  Procedure Laterality Date  . Abdominal hysterectomy    . Thyroidectomy, partial  09-05-11  . Elbow surgery  09-05-11    left elbow -ligament repair  . Umbilical hernia repair  yrs ago  . Colon resection  09/07/2011    Procedure: LAPAROSCOPIC SIGMOID COLON RESECTION;  Surgeon: Mariella SaaBenjamin T  Jun Rightmyer, MD;  Location: [facility];  Service: General;  Laterality: N/A;  with proctoscopy  . Laparoscopy  09/07/2011    Procedure: LAPAROSCOPY DIAGNOSTIC;  Surgeon: Roselle LocusJames E Tomblin II;  Location: [facility];  Service: Gynecology;  Laterality: N/A;  . Salpingoophorectomy  09/07/2011    Procedure: SALPINGO OOPHERECTOMY;  Surgeon: Roselle LocusJames E Tomblin II;  Location: [facility];  Service: Gynecology;  Laterality: Right;  . Tubal ligation  1983  . Ventral hernia repair  06/20/2012    Procedure: LAPAROSCOPIC VENTRAL HERNIA;  Surgeon: Mariella SaaBenjamin T Dayona Shaheen, MD;  Location: [facility];  Service: General;  Laterality: N/A;  Laparoscopic Repair of Ventral Hernia with mesh   Current Outpatient Prescriptions  Medication Sig Dispense Refill  . atorvastatin (LIPITOR) 40 MG tablet TAKE 1 TABLET BY MOUTH EVERY DAY  90 tablet  1  . atorvastatin (LIPITOR) 40 MG tablet TAKE 1 TABLET BY MOUTH EVERY DAY  90 tablet  1  . butalbital-acetaminophen-caffeine (FIORICET, ESGIC) 50-325-40 MG per tablet TAKE 2 TABLETS BY MOUTH EVERY 6 HOURS AS NEEDED  120 tablet  2  . clopidogrel (PLAVIX) 75 MG tablet TAKE 1 TABLET BY MOUTH ONCE A DAY  90 tablet  3  . diclofenac (VOLTAREN) 75 MG EC tablet TAKE 1 TABLET BY MOUTH TWICE A DAY  180 tablet  0  . levothyroxine (SYNTHROID, LEVOTHROID) 125 MCG tablet Take 1 tablet (125 mcg total) by mouth daily before  breakfast.  90 tablet  3  . meclizine (ANTIVERT) 25 MG tablet Take 1 tablet (25 mg total) by mouth 3 (three) times daily as needed for dizziness.  30 tablet  2  . ranitidine (ZANTAC) 150 MG tablet Take 150 mg by mouth as needed for heartburn.      . tetrahydrozoline 0.05 % ophthalmic solution Place 2 drops into both eyes as needed. Red eyes      . traMADol (ULTRAM) 50 MG tablet TAKE 1 TABLET BY MOUTH EVERY 6 HOURS AS NEEDED FOR PAIN  90 tablet  2  . verapamil (VERELAN PM) 360 MG 24 hr capsule Take 1 capsule (360 mg total) by mouth at bedtime.  90 capsule  1   No current facility-administered medications  for this visit.   Allergies  Allergen Reactions  . Aggrenox [Aspirin-Dipyridamole Er] Nausea And Vomiting and Other (See Comments)    Headache   . Dilaudid [Hydromorphone Hcl] Shortness Of Breath and Nausea And Vomiting  . Shellfish Allergy Shortness Of Breath and Rash    Only shrimp allergy  . Codeine Phosphate Nausea And Vomiting    REACTION: unspecified  . Hydrocodone Nausea And Vomiting  . Meperidine Hcl Nausea And Vomiting  . Percocet [Oxycodone-Acetaminophen] Nausea And Vomiting  . Cephalexin Itching and Rash   History  Substance Use Topics  . Smoking status: Former Smoker -- 0.50 packs/day for 5 years    Types: Cigarettes    Quit date: 09/19/1968  . Smokeless tobacco: Never Used  . Alcohol Use: Yes     Comment: social -rare     Review of Systems  Constitutional: Negative for fever and chills.  Respiratory: Negative.   Cardiovascular: Negative.   Gastrointestinal: Positive for abdominal pain. Negative for nausea, vomiting, diarrhea, constipation and blood in stool.       Objective:   Physical Exam BP 125/87  Pulse 72  Temp(Src) 98.3 F (36.8 C) (Temporal)  Resp 16  Ht 5' 5.5" (1.664 m)  Wt 190 lb 3.2 oz (86.274 kg)  BMI 31.16 kg/m2 General: Well-appearing African American female no distress Skin: No rash infection HEENT: No palpable masses or thyromegaly. Sclera nonicteric Lungs: Clear equal breath sounds bilaterally without increased work of breathing Cardiac: Regular rate and rhythm without murmurs. No edema. Abdomen: well healed infraumbilical incision about 2 cm in length. Her abdominal wall feels solid this area. However several centimeters above the umbilicus is a several centimeter slightly tender reducible midline hernia. No other masses or tenderness. No organomegaly. Extremities: No joint swelling or deformity Neurologic: Alert and oriented. Gait normal.    Assessment:     Ventral hernia. This is superior to her previous incision and hernia but  I expect represents a recurrence at the uppermost aspect of her incision. It is symptomatic and enlarging. I recommended proceeding with repair. I still think she would be a very good candidate for laparoscopic repair and we would just plan to put a larger piece of mesh into cover the superior aspect of her incision. We discussed the nature of the procedure and indications as well as risks of anesthetic complications, bleeding, infection, intestinal injury and recurrence. [facility] and given literature regarding the procedure. All questions were answered.    Plan:     Laparoscopic repair of recurrent ventral incisional hernia with a.m. Admission. We will call the patient regarding scheduling.

## 2013-12-04 ENCOUNTER — Encounter (HOSPITAL_COMMUNITY): Payer: Self-pay | Admitting: Pharmacy Technician

## 2013-12-09 ENCOUNTER — Encounter (HOSPITAL_COMMUNITY)
Admission: RE | Admit: 2013-12-09 | Discharge: 2013-12-09 | Disposition: A | Discharge status: Home / Self Care | Payer: Medicare PPO | Source: Ambulatory Visit | Attending: General Surgery | Admitting: General Surgery

## 2013-12-09 ENCOUNTER — Ambulatory Visit (HOSPITAL_COMMUNITY)
Admission: RE | Admit: 2013-12-09 | Discharge: 2013-12-09 | Disposition: A | Discharge status: Home / Self Care | Payer: Medicare PPO | Source: Ambulatory Visit | Attending: General Surgery | Admitting: General Surgery

## 2013-12-09 ENCOUNTER — Encounter (HOSPITAL_COMMUNITY): Payer: Self-pay

## 2013-12-09 DIAGNOSIS — Z01812 Encounter for preprocedural laboratory examination: Secondary | ICD-10-CM | POA: Insufficient documentation

## 2013-12-09 DIAGNOSIS — Z01810 Encounter for preprocedural cardiovascular examination: Secondary | ICD-10-CM | POA: Insufficient documentation

## 2013-12-09 DIAGNOSIS — I517 Cardiomegaly: Secondary | ICD-10-CM | POA: Insufficient documentation

## 2013-12-09 DIAGNOSIS — Z01818 Encounter for other preprocedural examination: Secondary | ICD-10-CM | POA: Insufficient documentation

## 2013-12-09 DIAGNOSIS — R9431 Abnormal electrocardiogram [ECG] [EKG]: Secondary | ICD-10-CM | POA: Insufficient documentation

## 2013-12-09 DIAGNOSIS — I498 Other specified cardiac arrhythmias: Secondary | ICD-10-CM | POA: Insufficient documentation

## 2013-12-09 DIAGNOSIS — I10 Essential (primary) hypertension: Secondary | ICD-10-CM | POA: Insufficient documentation

## 2013-12-09 HISTORY — DX: Reserved for concepts with insufficient information to code with codable children: IMO0002

## 2013-12-09 HISTORY — DX: Spontaneous ecchymoses: R23.3

## 2013-12-09 HISTORY — DX: Headache: R51

## 2013-12-09 HISTORY — DX: Other skin changes: R23.8

## 2013-12-09 LAB — BASIC METABOLIC PANEL
BUN: 12 mg/dL (ref 6–23)
CHLORIDE: 105 meq/L (ref 96–112)
CO2: 25 mEq/L (ref 19–32)
CREATININE: 0.71 mg/dL (ref 0.50–1.10)
Calcium: 9.7 mg/dL (ref 8.4–10.5)
GFR calc Af Amer: >90 mL/min (ref >90)
GFR calc non Af Amer: 87 mL/min — ABNORMAL LOW (ref >90)
GLUCOSE: 95 mg/dL (ref 70–99)
POTASSIUM: 3.8 meq/L (ref 3.7–5.3)
Sodium: 142 mEq/L (ref 137–147)

## 2013-12-09 LAB — CBC
HEMATOCRIT: 37.9 % (ref 36.0–46.0)
HEMOGLOBIN: 12.7 g/dL (ref 12.0–15.0)
MCH: 30 pg (ref 26.0–34.0)
MCHC: 33.5 g/dL (ref 30.0–36.0)
MCV: 89.4 fL (ref 78.0–100.0)
Platelets: 173 10*3/uL (ref 150–400)
RBC: 4.24 MIL/uL (ref 3.87–5.11)
RDW: 13 % (ref 11.5–15.5)
WBC: 6.2 10*3/uL (ref 4.0–10.5)

## 2013-12-09 NOTE — Patient Instructions (Addendum)
Enid DerryLynn G Kackley  12/09/2013                           YOUR PROCEDURE IS SCHEDULED ON: 12/13/13               PLEASE REPORT TO SHORT STAY CENTER AT : 5:30 AM               CALL THIS NUMBER IF ANY PROBLEMS THE DAY OF SURGERY :               [phone]                                REMEMBER:   Do not eat food or drink liquids AFTER MIDNIGHT                 Take these medicines the morning of surgery with A SIP OF WATER: LEVOTHYROXINE   Do not wear jewelry, make-up   Do not wear lotions, powders, or perfumes.   Do not shave legs or underarms 12 hrs. before surgery (men may shave face)  Do not bring valuables to the hospital.  Contacts, dentures or bridgework may not be worn into surgery.  Leave suitcase in the car. After surgery it may be brought to your room.  For patients admitted to the hospital more than one night, checkout time is            11:00 AM                                                       The day of discharge.   Patients discharged the day of surgery will not be allowed to drive home.            If going home same day of surgery, must have someone stay with you              FIRST 24 hrs at home and arrange for some one to drive you              home from hospital.    Special Instructions             Please read over the following fact sheets that you were given:               1. [facility] PREPARING FOR SURGERY SHEET               2. STOP ASPIRIN AND HERBAL MEDS 5 DAYS PREOP                                                X_____________________________________________________________________        Failure to follow these instructions may result in cancellation of your surgery

## 2013-12-11 ENCOUNTER — Ambulatory Visit (INDEPENDENT_AMBULATORY_CARE_PROVIDER_SITE_OTHER): Payer: Medicare PPO | Admitting: Internal Medicine

## 2013-12-11 ENCOUNTER — Telehealth: Payer: Self-pay

## 2013-12-11 ENCOUNTER — Encounter: Payer: Self-pay | Admitting: Internal Medicine

## 2013-12-11 VITALS — BP 130/90 | HR 65 | Temp 98.2°F | Resp 20 | Ht 65.5 in | Wt 190.0 lb

## 2013-12-11 DIAGNOSIS — M7061 Trochanteric bursitis, right hip: Secondary | ICD-10-CM

## 2013-12-11 DIAGNOSIS — M76899 Other specified enthesopathies of unspecified lower limb, excluding foot: Secondary | ICD-10-CM

## 2013-12-11 DIAGNOSIS — I10 Essential (primary) hypertension: Secondary | ICD-10-CM

## 2013-12-11 DIAGNOSIS — E785 Hyperlipidemia, unspecified: Secondary | ICD-10-CM

## 2013-12-11 MED ORDER — OSELTAMIVIR PHOSPHATE 75 MG PO CAPS: 75.0000 mg | ORAL_CAPSULE | Freq: Two times a day (BID) | ORAL | Status: DC

## 2013-12-11 NOTE — Progress Notes (Signed)
Pre-visit discussion using our clinic review tool. No additional management support is needed unless otherwise documented below in the visit note.  

## 2013-12-11 NOTE — Telephone Encounter (Signed)
Error // ck °

## 2013-12-11 NOTE — Progress Notes (Signed)
Subjective:    Patient ID: Abigail [name], female    DOB: 09/18/1946, 68 y.o.   MRN: [id number]  HPI  68 year old patient, who presents with a two-week history of worsening right hip pain.  There's been no fall or trauma.  She is scheduled for elective ventral hernia repair later this week.  No constitutional complaints.  Pain is often aggravated by walking and movement.  Pain is aggravated by sleeping on her right side and alleviated by sleeping on the left side.  Past Medical History  Diagnosis Date  . Hyperlipidemia   . History of colonic polyps   . Late effect of adverse effect of drug, medicinal or biological substance   . History of tension headache   . Heart palpitations   . Diverticulitis 09-05-11    hx. gastritis, diverticulitis x2 -now surgery planned  . Patent foramen ovale     Small - unable to be closed -   . Hypertension 09-05-11    tx. Verapamil  . Hypothyroidism 09-05-11    Supplement used  . Degenerative joint disease of cervical spine 09-05-11    Cervival area, now some osteoarthritis-lower back and Rt. shoulder  . Osteoarthritis 112-17-12    spine and rt. hip, rt. shoulder  . Sleep apnea 09-05-11    no cpap ever, had surgery to remove cartilage, no problems now  . Stroke 09-05-11    2006/2009-(loss of memory, balance issues remains occ.)  . PONV (postoperative nausea and vomiting)   . Shortness of breath     occasionally  . Headache(784.0)   . Bruises easily   . DDD (degenerative disc disease)     History   Social History  . Marital Status: Married    Spouse Name: N/A    Number of Children: N/A  . Years of Education: N/A   Occupational History  . Not on file.   Social History Main Topics  . Smoking status: Former Smoker -- 0.50 packs/day for 5 years    Types: Cigarettes    Quit date: 09/19/1968  . Smokeless tobacco: Never Used  . Alcohol Use: Yes     Comment:  -rare  . Drug Use: No  . Sexual Activity: Yes   Other Topics Concern  . Not on  file   Social History Narrative  . No narrative on file    Past Surgical History  Procedure Laterality Date  . Abdominal hysterectomy    . Thyroidectomy, partial  09-05-11  . Elbow surgery  09-05-11    left elbow -ligament repair  . Umbilical hernia repair  yrs ago  . Colon resection  09/07/2011    Procedure: LAPAROSCOPIC SIGMOID COLON RESECTION;  Surgeon: Mariella Saa, MD;  Location: [facility];  Service: General;  Laterality: N/A;  with proctoscopy  . Laparoscopy  09/07/2011    Procedure: LAPAROSCOPY DIAGNOSTIC;  Surgeon: Roselle Locus II;  Location: [facility];  Service: Gynecology;  Laterality: N/A;  . Salpingoophorectomy  09/07/2011    Procedure: SALPINGO OOPHERECTOMY;  Surgeon: Roselle Locus II;  Location: [facility];  Service: Gynecology;  Laterality: Right;  . Tubal ligation  1983  . Ventral hernia repair  06/20/2012    Procedure: LAPAROSCOPIC VENTRAL HERNIA;  Surgeon: Mariella Saa, MD;  Location: [facility];  Service: General;  Laterality: N/A;  Laparoscopic Repair of Ventral Hernia with mesh    History reviewed. No pertinent family history.  Allergies  Allergen Reactions  . Aggrenox [Aspirin-Dipyridamole Er] Nausea And Vomiting and Other (  See Comments)    Headache   . Dilaudid [Hydromorphone Hcl] Shortness Of Breath and Nausea And Vomiting  . Shellfish Allergy Shortness Of Breath and Rash    Only shrimp allergy  . Codeine Phosphate Nausea And Vomiting    REACTION: unspecified  . Hydrocodone Nausea And Vomiting  . Meperidine Hcl Nausea And Vomiting  . Percocet [Oxycodone-Acetaminophen] Nausea And Vomiting  . Cephalexin Itching and Rash    Current Outpatient Prescriptions on File Prior to Visit  Medication Sig Dispense Refill  . atorvastatin (LIPITOR) 40 MG tablet Take 40 mg by mouth every evening.      . butalbital-acetaminophen-caffeine (FIORICET, ESGIC) 50-325-40 MG per tablet Take 2 tablets by mouth every 6 (six) hours as needed for headache.      .  clopidogrel (PLAVIX) 75 MG tablet Take 75 mg by mouth every evening.      Marland Kitchen levothyroxine (SYNTHROID, LEVOTHROID) 125 MCG tablet Take 125 mcg by mouth daily before breakfast.      . tetrahydrozoline 0.05 % ophthalmic solution Place 2 drops into both eyes as needed. Red eyes      . traMADol (ULTRAM) 50 MG tablet Take 50 mg by mouth every 6 (six) hours as needed for moderate pain.      . verapamil (VERELAN PM) 360 MG 24 hr capsule Take 360 mg by mouth at bedtime.       No current facility-administered medications on file prior to visit.    BP 130/90  Pulse 65  Temp(Src) 98.2 F (36.8 C) (Oral)  Resp 20  Ht 5' 5.5" (1.664 m)  Wt 190 lb (86.183 kg)  BMI 31.13 kg/m2  SpO2 99%       Review of Systems  Constitutional: Negative.   HENT: Negative for congestion, dental problem, hearing loss, rhinorrhea, sinus pressure, sore throat and tinnitus.   Eyes: Negative for pain, discharge and visual disturbance.  Respiratory: Negative for cough and shortness of breath.   Cardiovascular: Negative for chest pain, palpitations and leg swelling.  Gastrointestinal: Negative for nausea, vomiting, abdominal pain, diarrhea, constipation, blood in stool and abdominal distention.  Genitourinary: Negative for dysuria, urgency, frequency, hematuria, flank pain, vaginal bleeding, vaginal discharge, difficulty urinating, vaginal pain and pelvic pain.  Musculoskeletal: Positive for arthralgias and gait problem. Negative for joint swelling.  Skin: Negative for rash.  Neurological: Negative for dizziness, syncope, speech difficulty, weakness, numbness and headaches.  Hematological: Negative for adenopathy.  Psychiatric/Behavioral: Negative for behavioral problems, dysphoric mood and agitation. The patient is not nervous/anxious.        Objective:   Physical Exam  Constitutional: She is oriented to person, place, and time. She appears well-developed and well-nourished.  HENT:  Head: Normocephalic.  Right  Ear: External ear normal.  Left Ear: External ear normal.  Mouth/Throat: Oropharynx is clear and moist.  Status post uvuloplasty  Eyes: Conjunctivae and EOM are normal. Pupils are equal, round, and reactive to light.  Neck: Normal range of motion. Neck supple. No thyromegaly present.  Cardiovascular: Normal rate, regular rhythm, normal heart sounds and intact distal pulses.   Pulmonary/Chest: Effort normal and breath sounds normal.  Abdominal: Soft. Bowel sounds are normal. She exhibits no mass. There is no tenderness.  Musculoskeletal: Normal range of motion.  Full range of motion of the right hip without diminished range of motion Tenderness over the right greater trochanter  Lymphadenopathy:    She has no cervical adenopathy.  Neurological: She is alert and oriented to person, place, and time.  Skin:  Skin is warm and dry. No rash noted.  Psychiatric: She has a normal mood and affect. Her behavior is normal.          Assessment & Plan:   Right hip pain secondary to greater trochanteric bursitis  Procedure note:  after informed consent.  The area of the right trochanteric bursa injected with 2 cc of lidocaine 1% and 1 cc of 80 mg of Depo-Medrol after alcohol prep.  A bandage applied.  Patient tolerated procedure well

## 2013-12-11 NOTE — Patient Instructions (Signed)

## 2013-12-12 ENCOUNTER — Other Ambulatory Visit: Payer: Self-pay | Admitting: Internal Medicine

## 2013-12-13 ENCOUNTER — Encounter (HOSPITAL_COMMUNITY)
Admission: RE | Disposition: A | Discharge status: Home / Self Care | Payer: Self-pay | Source: Ambulatory Visit | Attending: General Surgery

## 2013-12-13 ENCOUNTER — Encounter (HOSPITAL_COMMUNITY): Payer: Self-pay | Admitting: *Deleted

## 2013-12-13 ENCOUNTER — Inpatient Hospital Stay (HOSPITAL_COMMUNITY)
Admission: RE | Admit: 2013-12-13 | Discharge: 2013-12-14 | DRG: 355 | Disposition: A | Discharge status: Home / Self Care | Payer: Medicare PPO | Source: Ambulatory Visit | Attending: General Surgery | Admitting: General Surgery

## 2013-12-13 ENCOUNTER — Encounter (HOSPITAL_COMMUNITY): Payer: Medicare PPO | Admitting: *Deleted

## 2013-12-13 ENCOUNTER — Ambulatory Visit (HOSPITAL_COMMUNITY): Payer: Medicare PPO | Admitting: *Deleted

## 2013-12-13 DIAGNOSIS — Z8673 Personal history of transient ischemic attack (TIA), and cerebral infarction without residual deficits: Secondary | ICD-10-CM

## 2013-12-13 DIAGNOSIS — K432 Incisional hernia without obstruction or gangrene: Secondary | ICD-10-CM

## 2013-12-13 DIAGNOSIS — I10 Essential (primary) hypertension: Secondary | ICD-10-CM | POA: Diagnosis present

## 2013-12-13 DIAGNOSIS — Z87891 Personal history of nicotine dependence: Secondary | ICD-10-CM

## 2013-12-13 DIAGNOSIS — Z90710 Acquired absence of both cervix and uterus: Secondary | ICD-10-CM

## 2013-12-13 DIAGNOSIS — E039 Hypothyroidism, unspecified: Secondary | ICD-10-CM | POA: Diagnosis present

## 2013-12-13 HISTORY — PX: INSERTION OF MESH: SHX5868

## 2013-12-13 HISTORY — PX: VENTRAL HERNIA REPAIR: SHX424

## 2013-12-13 HISTORY — DX: Transient cerebral ischemic attack, unspecified: G45.9

## 2013-12-13 HISTORY — DX: Cervicalgia: M54.2

## 2013-12-13 HISTORY — DX: Acute pharyngitis, unspecified: J02.9

## 2013-12-13 HISTORY — DX: Acute upper respiratory infection, unspecified: J06.9

## 2013-12-13 HISTORY — DX: Unspecified abdominal pain: R10.9

## 2013-12-13 HISTORY — DX: Diverticulitis of large intestine without perforation or abscess without bleeding: K57.32

## 2013-12-13 SURGERY — REPAIR, HERNIA, VENTRAL, LAPAROSCOPIC
Anesthesia: General | Site: Abdomen

## 2013-12-13 MED ORDER — GLYCOPYRROLATE 0.2 MG/ML IJ SOLN
INTRAMUSCULAR | Status: DC | PRN
  Administered 2013-12-13: 0.1 mg via INTRAVENOUS
  Administered 2013-12-13: 0.6 mg via INTRAVENOUS
  Administered 2013-12-13: 0.1 mg via INTRAVENOUS

## 2013-12-13 MED ORDER — ONDANSETRON HCL 4 MG/2ML IJ SOLN: 4.0000 mg | Freq: Four times a day (QID) | INTRAMUSCULAR | Status: DC | PRN

## 2013-12-13 MED ORDER — GLYCOPYRROLATE 0.2 MG/ML IJ SOLN
INTRAMUSCULAR | Status: AC
  Filled 2013-12-13: qty 3

## 2013-12-13 MED ORDER — BUTALBITAL-APAP-CAFFEINE 50-325-40 MG PO TABS
2.0000 | ORAL_TABLET | Freq: Four times a day (QID) | ORAL | Status: DC | PRN
  Administered 2013-12-14: 2 via ORAL
  Filled 2013-12-13: qty 2

## 2013-12-13 MED ORDER — MIDAZOLAM HCL 2 MG/2ML IJ SOLN
INTRAMUSCULAR | Status: AC
  Filled 2013-12-13: qty 2

## 2013-12-13 MED ORDER — LACTATED RINGERS IR SOLN
Status: DC | PRN
  Administered 2013-12-13: 1000 mL

## 2013-12-13 MED ORDER — ROCURONIUM BROMIDE 100 MG/10ML IV SOLN
INTRAVENOUS | Status: AC
  Filled 2013-12-13: qty 1

## 2013-12-13 MED ORDER — EPHEDRINE SULFATE 50 MG/ML IJ SOLN
INTRAMUSCULAR | Status: DC | PRN
  Administered 2013-12-13: 5 mg via INTRAVENOUS

## 2013-12-13 MED ORDER — NEOSTIGMINE METHYLSULFATE 1 MG/ML IJ SOLN
INTRAMUSCULAR | Status: DC | PRN
  Administered 2013-12-13: 4 mg via INTRAVENOUS

## 2013-12-13 MED ORDER — LEVOTHYROXINE SODIUM 125 MCG PO TABS
125.0000 ug | ORAL_TABLET | Freq: Every day | ORAL | Status: DC
  Administered 2013-12-14: 125 ug via ORAL
  Filled 2013-12-13 (×2): qty 1

## 2013-12-13 MED ORDER — LIDOCAINE HCL (CARDIAC) 20 MG/ML IV SOLN
INTRAVENOUS | Status: DC | PRN
  Administered 2013-12-13: 50 mg via INTRAVENOUS

## 2013-12-13 MED ORDER — MORPHINE SULFATE 10 MG/ML IJ SOLN: 1.0000 mg | INTRAMUSCULAR | Status: DC | PRN

## 2013-12-13 MED ORDER — NEOSTIGMINE METHYLSULFATE 1 MG/ML IJ SOLN
INTRAMUSCULAR | Status: AC
  Filled 2013-12-13: qty 10

## 2013-12-13 MED ORDER — LIDOCAINE HCL (CARDIAC) 20 MG/ML IV SOLN
INTRAVENOUS | Status: AC
  Filled 2013-12-13: qty 5

## 2013-12-13 MED ORDER — ONDANSETRON HCL 4 MG PO TABS: 4.0000 mg | ORAL_TABLET | Freq: Four times a day (QID) | ORAL | Status: DC | PRN

## 2013-12-13 MED ORDER — ACETAMINOPHEN 10 MG/ML IV SOLN
1000.0000 mg | Freq: Once | INTRAVENOUS | Status: AC
  Administered 2013-12-13: 1000 mg via INTRAVENOUS
  Filled 2013-12-13: qty 100

## 2013-12-13 MED ORDER — SODIUM CHLORIDE 0.9 % IJ SOLN
INTRAMUSCULAR | Status: AC
  Administered 2013-12-13: 12:00:00
  Filled 2013-12-13: qty 3

## 2013-12-13 MED ORDER — KCL IN DEXTROSE-NACL 20-5-0.9 MEQ/L-%-% IV SOLN
INTRAVENOUS | Status: DC
  Administered 2013-12-13 – 2013-12-14 (×2): via INTRAVENOUS
  Filled 2013-12-13 (×4): qty 1000

## 2013-12-13 MED ORDER — MIDAZOLAM HCL 5 MG/5ML IJ SOLN
INTRAMUSCULAR | Status: DC | PRN
  Administered 2013-12-13 (×2): 1 mg via INTRAVENOUS

## 2013-12-13 MED ORDER — ROCURONIUM BROMIDE 100 MG/10ML IV SOLN
INTRAVENOUS | Status: DC | PRN
  Administered 2013-12-13: 50 mg via INTRAVENOUS
  Administered 2013-12-13 (×2): 10 mg via INTRAVENOUS

## 2013-12-13 MED ORDER — CHLORHEXIDINE GLUCONATE 4 % EX LIQD: 1.0000 "application " | Freq: Once | CUTANEOUS | Status: DC

## 2013-12-13 MED ORDER — MORPHINE SULFATE 2 MG/ML IJ SOLN
2.0000 mg | INTRAMUSCULAR | Status: DC | PRN
  Administered 2013-12-13 – 2013-12-14 (×3): 2 mg via INTRAVENOUS
  Filled 2013-12-13 (×3): qty 1

## 2013-12-13 MED ORDER — PROPOFOL 10 MG/ML IV BOLUS
INTRAVENOUS | Status: DC | PRN
  Administered 2013-12-13: 160 mg via INTRAVENOUS

## 2013-12-13 MED ORDER — DEXAMETHASONE SODIUM PHOSPHATE 10 MG/ML IJ SOLN
INTRAMUSCULAR | Status: DC | PRN
  Administered 2013-12-13: 5 mg via INTRAVENOUS

## 2013-12-13 MED ORDER — LACTATED RINGERS IV SOLN
INTRAVENOUS | Status: DC | PRN
  Administered 2013-12-13 (×2): via INTRAVENOUS

## 2013-12-13 MED ORDER — FENTANYL CITRATE 0.05 MG/ML IJ SOLN
INTRAMUSCULAR | Status: AC
  Filled 2013-12-13: qty 5

## 2013-12-13 MED ORDER — MORPHINE SULFATE 15 MG PO TABS: 15.0000 mg | ORAL_TABLET | ORAL | Status: DC | PRN

## 2013-12-13 MED ORDER — ONDANSETRON HCL 4 MG/2ML IJ SOLN
INTRAMUSCULAR | Status: AC
  Filled 2013-12-13: qty 2

## 2013-12-13 MED ORDER — FENTANYL CITRATE 0.05 MG/ML IJ SOLN
INTRAMUSCULAR | Status: DC | PRN
  Administered 2013-12-13: 50 ug via INTRAVENOUS
  Administered 2013-12-13: 25 ug via INTRAVENOUS
  Administered 2013-12-13: 50 ug via INTRAVENOUS
  Administered 2013-12-13: 25 ug via INTRAVENOUS
  Administered 2013-12-13 (×2): 50 ug via INTRAVENOUS

## 2013-12-13 MED ORDER — RACEPINEPHRINE HCL 2.25 % IN NEBU
0.5000 mL | INHALATION_SOLUTION | RESPIRATORY_TRACT | Status: AC
  Administered 2013-12-13: 0.5 mL via RESPIRATORY_TRACT
  Filled 2013-12-13: qty 0.5

## 2013-12-13 MED ORDER — BUPIVACAINE-EPINEPHRINE 0.25% -1:200000 IJ SOLN
INTRAMUSCULAR | Status: DC | PRN
  Administered 2013-12-13: 20 mL

## 2013-12-13 MED ORDER — VERAPAMIL HCL ER 180 MG PO TBCR
360.0000 mg | EXTENDED_RELEASE_TABLET | Freq: Every day | ORAL | Status: DC
  Administered 2013-12-13: 360 mg via ORAL
  Filled 2013-12-13 (×2): qty 2

## 2013-12-13 MED ORDER — PROMETHAZINE HCL 25 MG/ML IJ SOLN: 6.2500 mg | INTRAMUSCULAR | Status: DC | PRN

## 2013-12-13 MED ORDER — HEPARIN SODIUM (PORCINE) 5000 UNIT/ML IJ SOLN
5000.0000 [IU] | Freq: Three times a day (TID) | INTRAMUSCULAR | Status: DC
  Administered 2013-12-13 – 2013-12-14 (×2): 5000 [IU] via SUBCUTANEOUS
  Filled 2013-12-13 (×5): qty 1

## 2013-12-13 MED ORDER — CIPROFLOXACIN IN D5W 400 MG/200ML IV SOLN
INTRAVENOUS | Status: AC
  Filled 2013-12-13: qty 200

## 2013-12-13 MED ORDER — METOCLOPRAMIDE HCL 5 MG/ML IJ SOLN
INTRAMUSCULAR | Status: DC | PRN
  Administered 2013-12-13: 10 mg via INTRAVENOUS

## 2013-12-13 MED ORDER — TRAMADOL HCL 50 MG PO TABS
50.0000 mg | ORAL_TABLET | Freq: Four times a day (QID) | ORAL | Status: DC | PRN
  Administered 2013-12-14: 50 mg via ORAL
  Filled 2013-12-13: qty 1

## 2013-12-13 MED ORDER — ONDANSETRON HCL 4 MG/2ML IJ SOLN
INTRAMUSCULAR | Status: DC | PRN
  Administered 2013-12-13: 4 mg via INTRAVENOUS

## 2013-12-13 MED ORDER — OSELTAMIVIR PHOSPHATE 75 MG PO CAPS: 75.0000 mg | ORAL_CAPSULE | Freq: Every day | ORAL | Status: DC

## 2013-12-13 MED ORDER — BUPIVACAINE-EPINEPHRINE PF 0.25-1:200000 % IJ SOLN
INTRAMUSCULAR | Status: AC
  Filled 2013-12-13: qty 30

## 2013-12-13 MED ORDER — PROPOFOL 10 MG/ML IV BOLUS
INTRAVENOUS | Status: AC
  Filled 2013-12-13: qty 20

## 2013-12-13 MED ORDER — CIPROFLOXACIN IN D5W 400 MG/200ML IV SOLN
400.0000 mg | INTRAVENOUS | Status: AC
  Administered 2013-12-13: 400 mg via INTRAVENOUS

## 2013-12-13 SURGICAL SUPPLY — 54 items
ADH SKN CLS APL DERMABOND .7 (GAUZE/BANDAGES/DRESSINGS) ×2
APL SKNCLS STERI-STRIP NONHPOA (GAUZE/BANDAGES/DRESSINGS)
APPLIER CLIP 5 13 M/L LIGAMAX5 (MISCELLANEOUS)
APR CLP MED LRG 5 ANG JAW (MISCELLANEOUS)
BENZOIN TINCTURE PRP APPL 2/3 (GAUZE/BANDAGES/DRESSINGS) IMPLANT
BINDER ABDOMINAL 12 ML 46-62 (SOFTGOODS) ×2 IMPLANT
CANISTER SUCTION 2500CC (MISCELLANEOUS) ×4 IMPLANT
CHLORAPREP W/TINT 26ML (MISCELLANEOUS) ×4 IMPLANT
CLIP APPLIE 5 13 M/L LIGAMAX5 (MISCELLANEOUS) IMPLANT
CLOSURE WOUND 1/2 X4 (GAUZE/BANDAGES/DRESSINGS)
COVER SURGICAL LIGHT HANDLE (MISCELLANEOUS) ×4 IMPLANT
DECANTER SPIKE VIAL GLASS SM (MISCELLANEOUS) ×2 IMPLANT
DERMABOND ADVANCED (GAUZE/BANDAGES/DRESSINGS) ×2
DERMABOND ADVANCED .7 DNX12 (GAUZE/BANDAGES/DRESSINGS) IMPLANT
DEVICE SECURE STRAP 25 ABSORB (INSTRUMENTS) ×2 IMPLANT
DEVICE TROCAR PUNCTURE CLOSURE (ENDOMECHANICALS) ×2 IMPLANT
DISSECTOR BLUNT TIP ENDO 5MM (MISCELLANEOUS) IMPLANT
DRAPE LAPAROSCOPIC ABDOMINAL (DRAPES) ×4 IMPLANT
DRAPE UTILITY XL STRL (DRAPES) ×4 IMPLANT
ELECT REM PT RETURN 9FT ADLT (ELECTROSURGICAL) ×4
ELECTRODE REM PT RTRN 9FT ADLT (ELECTROSURGICAL) ×2 IMPLANT
GLOVE BIOGEL PI IND STRL 7.0 (GLOVE) ×2 IMPLANT
GLOVE BIOGEL PI INDICATOR 7.0 (GLOVE) ×2
GLOVE SURG SS PI 7.5 STRL IVOR (GLOVE) ×18 IMPLANT
GOWN STRL REUS W/TWL LRG LVL3 (GOWN DISPOSABLE) ×6 IMPLANT
GOWN STRL REUS W/TWL XL LVL3 (GOWN DISPOSABLE) ×8 IMPLANT
KIT BASIN OR (CUSTOM PROCEDURE TRAY) ×4 IMPLANT
MARKER SKIN DUAL TIP RULER LAB (MISCELLANEOUS) ×4 IMPLANT
MESH VENTRALIGHT ST 4X6IN (Mesh General) ×2 IMPLANT
NDL SPNL 22GX3.5 QUINCKE BK (NEEDLE) ×2 IMPLANT
NEEDLE SPNL 22GX3.5 QUINCKE BK (NEEDLE) ×4 IMPLANT
NS IRRIG 1000ML POUR BTL (IV SOLUTION) ×4 IMPLANT
PENCIL BUTTON HOLSTER BLD 10FT (ELECTRODE) IMPLANT
SCALPEL HARMONIC ACE (MISCELLANEOUS) ×2 IMPLANT
SCISSORS LAP 5X35 DISP (ENDOMECHANICALS) ×2 IMPLANT
SET IRRIG TUBING LAPAROSCOPIC (IRRIGATION / IRRIGATOR) ×2 IMPLANT
SLEEVE ADV FIXATION 5X100MM (TROCAR) ×2 IMPLANT
SLEEVE Z-THREAD 5X100MM (TROCAR) IMPLANT
SOLUTION ANTI FOG 6CC (MISCELLANEOUS) ×4 IMPLANT
STRIP CLOSURE SKIN 1/2X4 (GAUZE/BANDAGES/DRESSINGS) IMPLANT
SUT MNCRL AB 4-0 PS2 18 (SUTURE) ×4 IMPLANT
SUT NOVA NAB GS-21 0 18 T12 DT (SUTURE) ×4 IMPLANT
SUT PROLENE 0 CT 1 CR/8 (SUTURE) IMPLANT
TACKER 5MM HERNIA 3.5CML NAB (ENDOMECHANICALS) ×2 IMPLANT
TOWEL OR 17X26 10 PK STRL BLUE (TOWEL DISPOSABLE) ×4 IMPLANT
TRAY FOLEY CATH 14FRSI W/METER (CATHETERS) IMPLANT
TRAY LAP CHOLE (CUSTOM PROCEDURE TRAY) ×4 IMPLANT
TROCAR ADV FIXATION 11X100MM (TROCAR) ×2 IMPLANT
TROCAR ADV FIXATION 5X100MM (TROCAR) ×2 IMPLANT
TROCAR BLADELESS OPT 5 100 (ENDOMECHANICALS) ×2 IMPLANT
TROCAR BLADELESS OPT 5 75 (ENDOMECHANICALS) IMPLANT
TROCAR XCEL NON-BLD 11X100MML (ENDOMECHANICALS) IMPLANT
TROCAR XCEL UNIV SLVE 11M 100M (ENDOMECHANICALS) IMPLANT
TUBING INSUFFLATION 10FT LAP (TUBING) ×4 IMPLANT

## 2013-12-13 NOTE — Interval H&P Note (Signed)
History and Physical Interval Note:  12/13/2013 7:20 AM  Abigail [name]  has presented today for surgery, with the diagnosis of recurrent vental incisional hernia  The various methods of treatment have been discussed with the patient and family. After consideration of risks, benefits and other options for treatment, the patient has consented to  Procedure(s): LAPAROSCOPIC REPAIR RECURRENT VENTRAL INCISIONAL  HERNIA (N/A) as a surgical intervention .  The patient's history has been reviewed, patient examined, no change in status, stable for surgery.  I have reviewed the patient's chart and labs.  Questions were answered to the patient's satisfaction.     Maisha Bogen T

## 2013-12-13 NOTE — Progress Notes (Signed)
PACU note-----Pt having inspiratory stridor on arrival to PACU; SAO2 100; Dr. Okey Dupreose called, order rec'd for racemic epi treatment, P Flynn-Cook, CRNA at bedside, positive pressure with ambu bag; pt reassured

## 2013-12-13 NOTE — Op Note (Signed)
Preoperative Diagnosis: recurrent vental incisional hernia  Postoprative Diagnosis: recurrent vental incisional hernia  Procedure: Procedure(s): LAPAROSCOPIC REPAIR RECURRENT VENTRAL INCISIONAL  HERNIA INSERTION OF MESH   Surgeon: Glenna FellowsHoxworth, Shernell Saldierna T   Assistants: none  Anesthesia:  General endotracheal anesthesia  Indications: patient is a 68 year old female with a number of previous abdominal operations including laparoscopic tubal ligation, hysterectomy, laparoscopic sigmoid colectomy. Approximately 1-1/2 years ago I repaired a umbilical incisional hernia laparoscopically with mesh. She now presents with a recurrent hernia higher in the abdomen about 5 cm above the umbilicus. This is enlarging and symptomatic and I recommended proceeding with laparoscopic repair. We discussed the nature of the procedure and expected recovery and risks extensively which have been detailed elsewhere.    Procedure Detail:  Patient was brought to the operating room, placed in the supine position on the operating table, and general endotracheal anesthesia induced. She received preoperative IV antibiotics. PAS were in place. The abdomen was widely sterilely prepped and draped. Patient timeout was performed and correct procedure verified. Access was obtained in the right upper quadrant with a 5 mm Optiview trocar. Under direct vision an 11 mm trocar was placed in the left lower quadrant and a 5 mm trocar in the left upper quadrant. There was a previously placed intraperitoneal piece of mesh which was very nicely deployed over the lower abdomen and periumbilical area. There were just a few minimal omental adhesions to this that were taken down with harmonic scalpel. In the midline just at the superior edge of the mesh was a discrete approximately 2 cm hernia which was right at the base of the falciform ligament.  The falciform ligament was retracted posteriorly and I completely took this down from the anterior  abdominal wall up for about 10 cm toward the epigastrium to allow wide coverage of mesh over the defect. A 15 x 10.2 cm piece of Bard DualMesh was chosen for Psychologist, forensicrepair. 6 stay sutures of 0 Novafil were placed circumferentially. Sites on the anterior abdominal wall just out away from each suture were chosen and small stab wounds made for retrieval of the fixation sutures. The mesh was then moistened and coiled and introduced in the abdomen and deployed. Using the Endo Close device the stay sutures were brought up to the anterior abdominal wall and the mesh was seen to be deployed very nicely and smoothly, probably in all directions with broad coverage of the defect. The sutures were secured. The mesh was then tacked circumferentially with the Securestrap tacker also placing an anaerobic around the defect. There was no bleeding. The viscera were inspected and no evidence of injury or other problems. All CO2 was evacuated and trochars removed. Skin incisions were closed with Dermabond. Sponge needle and instrument counts were correct.    Findings: As above  Estimated Blood Loss:  Minimal         Drains: none  Blood Given: none          Specimens: none        Complications:  * No complications entered in OR log *         Disposition: PACU - hemodynamically stable.         Condition: stable

## 2013-12-13 NOTE — Anesthesia Preprocedure Evaluation (Signed)
Anesthesia Evaluation  Patient identified by MRN, date of birth, ID band Patient awake    Reviewed: Allergy & Precautions, H&P , NPO status , Patient's Chart, lab work & pertinent test results  Airway Mallampati: II TM Distance: >3 FB Neck ROM: Full    Dental no notable dental hx.    Pulmonary former smoker,  breath sounds clear to auscultation  Pulmonary exam normal       Cardiovascular hypertension, Pt. on medications Rhythm:Regular Rate:Normal     Neuro/Psych CVA, No Residual Symptoms negative psych ROS   GI/Hepatic negative GI ROS, Neg liver ROS,   Endo/Other  Hypothyroidism   Renal/GU negative Renal ROS  negative genitourinary   Musculoskeletal negative musculoskeletal ROS (+)   Abdominal   Peds negative pediatric ROS (+)  Hematology negative hematology ROS (+)   Anesthesia Other Findings   Reproductive/Obstetrics negative OB ROS                           Anesthesia Physical Anesthesia Plan  ASA: III  Anesthesia Plan: General   Post-op Pain Management:    Induction: Intravenous  Airway Management Planned: Oral ETT  Additional Equipment:   Intra-op Plan:   Post-operative Plan: Extubation in OR  Informed Consent: I have reviewed the patients History and Physical, chart, labs and discussed the procedure including the risks, benefits and alternatives for the proposed anesthesia with the patient or authorized representative who has indicated his/her understanding and acceptance.   Dental advisory given  Plan Discussed with: CRNA and Surgeon  Anesthesia Plan Comments:         Anesthesia Quick Evaluation

## 2013-12-13 NOTE — H&P (View-Only) (Signed)
Subjective:   pain and bulge in the upper abdomen  Patient ID: Abigail [name], female   DOB: 07/12/1946, 68 y.o.   MRN: [id number]  HPI Patient is a 68 year old female well known to me with history of laparoscopic sigmoid colectomy for recurrent symptomatic diverticulitis. She then subsequently developed an incisional hernia at her small infraumbilical extraction site and underwent laparoscopic repair about 1-1/2 years ago. About 3 months ago she noted somewhat of a bulge above her umbilicus but it was not painful. About a month ago she had some significant pain over several days which now has gradually improved although it is still sore. She has noted a gradual increase in the size of the bulge. No nausea or vomiting.  Past Medical History  Diagnosis Date  . Hyperlipidemia   . History of colonic polyps   . Late effect of adverse effect of drug, medicinal or biological substance   . History of tension headache   . Heart palpitations   . Diverticulitis 09-05-11    hx. gastritis, diverticulitis x2 -now surgery planned  . Patent foramen ovale     Small  . Hypertension 09-05-11    tx. Verapamil  . Hypothyroidism 09-05-11    Supplement used  . Degenerative joint disease of cervical spine 09-05-11    Cervival area, now some osteoarthritis-lower back and Rt. shoulder  . Osteoarthritis 112-17-12    spine and rt. hip, rt. shoulder  . Sleep apnea 09-05-11    no cpap ever, had surgery to remove cartilage, no problems now  . Stroke 09-05-11    2006/2009-(loss of memory, balance issues remains occ.)  . Cervical lymphadenopathy     Right side  . PONV (postoperative nausea and vomiting)    Past Surgical History  Procedure Laterality Date  . Abdominal hysterectomy    . Thyroidectomy, partial  09-05-11  . Elbow surgery  09-05-11    left elbow -ligament repair  . Umbilical hernia repair  yrs ago  . Colon resection  09/07/2011    Procedure: LAPAROSCOPIC SIGMOID COLON RESECTION;  Surgeon: Mariella SaaBenjamin T  Herminio Kniskern, MD;  Location: [facility];  Service: General;  Laterality: N/A;  with proctoscopy  . Laparoscopy  09/07/2011    Procedure: LAPAROSCOPY DIAGNOSTIC;  Surgeon: Roselle LocusJames E Tomblin II;  Location: [facility];  Service: Gynecology;  Laterality: N/A;  . Salpingoophorectomy  09/07/2011    Procedure: SALPINGO OOPHERECTOMY;  Surgeon: Roselle LocusJames E Tomblin II;  Location: [facility];  Service: Gynecology;  Laterality: Right;  . Tubal ligation  1983  . Ventral hernia repair  06/20/2012    Procedure: LAPAROSCOPIC VENTRAL HERNIA;  Surgeon: Mariella SaaBenjamin T Terrin Meddaugh, MD;  Location: [facility];  Service: General;  Laterality: N/A;  Laparoscopic Repair of Ventral Hernia with mesh   Current Outpatient Prescriptions  Medication Sig Dispense Refill  . atorvastatin (LIPITOR) 40 MG tablet TAKE 1 TABLET BY MOUTH EVERY DAY  90 tablet  1  . atorvastatin (LIPITOR) 40 MG tablet TAKE 1 TABLET BY MOUTH EVERY DAY  90 tablet  1  . butalbital-acetaminophen-caffeine (FIORICET, ESGIC) 50-325-40 MG per tablet TAKE 2 TABLETS BY MOUTH EVERY 6 HOURS AS NEEDED  120 tablet  2  . clopidogrel (PLAVIX) 75 MG tablet TAKE 1 TABLET BY MOUTH ONCE A DAY  90 tablet  3  . diclofenac (VOLTAREN) 75 MG EC tablet TAKE 1 TABLET BY MOUTH TWICE A DAY  180 tablet  0  . levothyroxine (SYNTHROID, LEVOTHROID) 125 MCG tablet Take 1 tablet (125 mcg total) by mouth daily before  breakfast.  90 tablet  3  . meclizine (ANTIVERT) 25 MG tablet Take 1 tablet (25 mg total) by mouth 3 (three) times daily as needed for dizziness.  30 tablet  2  . ranitidine (ZANTAC) 150 MG tablet Take 150 mg by mouth as needed for heartburn.      . tetrahydrozoline 0.05 % ophthalmic solution Place 2 drops into both eyes as needed. Red eyes      . traMADol (ULTRAM) 50 MG tablet TAKE 1 TABLET BY MOUTH EVERY 6 HOURS AS NEEDED FOR PAIN  90 tablet  2  . verapamil (VERELAN PM) 360 MG 24 hr capsule Take 1 capsule (360 mg total) by mouth at bedtime.  90 capsule  1   No current facility-administered medications  for this visit.   Allergies  Allergen Reactions  . Aggrenox [Aspirin-Dipyridamole Er] Nausea And Vomiting and Other (See Comments)    Headache   . Dilaudid [Hydromorphone Hcl] Shortness Of Breath and Nausea And Vomiting  . Shellfish Allergy Shortness Of Breath and Rash    Only shrimp allergy  . Codeine Phosphate Nausea And Vomiting    REACTION: unspecified  . Hydrocodone Nausea And Vomiting  . Meperidine Hcl Nausea And Vomiting  . Percocet [Oxycodone-Acetaminophen] Nausea And Vomiting  . Cephalexin Itching and Rash   History  Substance Use Topics  . Smoking status: Former Smoker -- 0.50 packs/day for 5 years    Types: Cigarettes    Quit date: 09/19/1968  . Smokeless tobacco: Never Used  . Alcohol Use: Yes     Comment: social -rare     Review of Systems  Constitutional: Negative for fever and chills.  Respiratory: Negative.   Cardiovascular: Negative.   Gastrointestinal: Positive for abdominal pain. Negative for nausea, vomiting, diarrhea, constipation and blood in stool.       Objective:   Physical Exam BP 125/87  Pulse 72  Temp(Src) 98.3 F (36.8 C) (Temporal)  Resp 16  Ht 5' 5.5" (1.664 m)  Wt 190 lb 3.2 oz (86.274 kg)  BMI 31.16 kg/m2 General: Well-appearing African American female no distress Skin: No rash infection HEENT: No palpable masses or thyromegaly. Sclera nonicteric Lungs: Clear equal breath sounds bilaterally without increased work of breathing Cardiac: Regular rate and rhythm without murmurs. No edema. Abdomen: well healed infraumbilical incision about 2 cm in length. Her abdominal wall feels solid this area. However several centimeters above the umbilicus is a several centimeter slightly tender reducible midline hernia. No other masses or tenderness. No organomegaly. Extremities: No joint swelling or deformity Neurologic: Alert and oriented. Gait normal.    Assessment:     Ventral hernia. This is superior to her previous incision and hernia but  I expect represents a recurrence at the uppermost aspect of her incision. It is symptomatic and enlarging. I recommended proceeding with repair. I still think she would be a very good candidate for laparoscopic repair and we would just plan to put a larger piece of mesh into cover the superior aspect of her incision. We discussed the nature of the procedure and indications as well as risks of anesthetic complications, bleeding, infection, intestinal injury and recurrence. She [facility] and given literature regarding the procedure. All questions were answered.    Plan:     Laparoscopic repair of recurrent ventral incisional hernia with a.m. Admission. We will call the patient regarding scheduling.

## 2013-12-13 NOTE — Progress Notes (Signed)
PACU note---- treatment done, end tidal CO2 40

## 2013-12-13 NOTE — Transfer of Care (Signed)
Immediate Anesthesia Transfer of Care Note  Patient: Abigail [name]  Procedure(s) Performed: Procedure(s): LAPAROSCOPIC REPAIR RECURRENT VENTRAL INCISIONAL  HERNIA (N/A) INSERTION OF MESH (N/A)  Patient Location: PACU  Anesthesia Type:General  Level of Consciousness: awake, alert , oriented and patient cooperative  Airway & Oxygen Therapy: Patient Spontanous Breathing and Patient connected to face mask oxygen  Post-op Assessment: Report given to PACU RN, Post -op Vital signs reviewed and stable and Patient moving all extremities  Post vital signs: Reviewed  Complications: No apparent anesthesia complications

## 2013-12-13 NOTE — Anesthesia Postprocedure Evaluation (Signed)
  Anesthesia Post-op Note  Patient: Abigail [name]  Procedure(s) Performed: Procedure(s) (LRB): LAPAROSCOPIC REPAIR RECURRENT VENTRAL INCISIONAL  HERNIA (N/A) INSERTION OF MESH (N/A)  Patient Location: PACU  Anesthesia Type: General  Level of Consciousness: awake and alert   Airway and Oxygen Therapy: Patient Spontanous Breathing  Post-op Pain: mild  Post-op Assessment: Post-op Vital signs reviewed, Patient's Cardiovascular Status Stable, Respiratory Function Stable, Patent Airway and No signs of Nausea or vomiting  Last Vitals:  Filed Vitals:   12/13/13 0930  BP:   Pulse:   Temp: 36.6 C  Resp:     Post-op Vital Signs: stable   Complications: No apparent anesthesia complications

## 2013-12-14 ENCOUNTER — Encounter (HOSPITAL_COMMUNITY): Payer: Self-pay | Admitting: Surgery

## 2013-12-14 LAB — CBC
HCT: 36.1 % (ref 36.0–46.0)
HEMOGLOBIN: 11.8 g/dL — AB (ref 12.0–15.0)
MCH: 29.7 pg (ref 26.0–34.0)
MCHC: 32.7 g/dL (ref 30.0–36.0)
MCV: 90.9 fL (ref 78.0–100.0)
Platelets: 154 10*3/uL (ref 150–400)
RBC: 3.97 MIL/uL (ref 3.87–5.11)
RDW: 12.9 % (ref 11.5–15.5)
WBC: 8.8 10*3/uL (ref 4.0–10.5)

## 2013-12-14 MED ORDER — TRAMADOL HCL 50 MG PO TABS: 50.0000 mg | ORAL_TABLET | Freq: Four times a day (QID) | ORAL | Status: DC | PRN

## 2013-12-14 MED ORDER — OSELTAMIVIR PHOSPHATE 75 MG PO CAPS
75.0000 mg | ORAL_CAPSULE | Freq: Two times a day (BID) | ORAL | Status: DC
  Administered 2013-12-14 (×2): 75 mg via ORAL
  Filled 2013-12-14 (×3): qty 1

## 2013-12-14 MED ORDER — BUTALBITAL-APAP-CAFFEINE 50-325-40 MG PO TABS: 2.0000 | ORAL_TABLET | Freq: Four times a day (QID) | ORAL | Status: DC | PRN

## 2013-12-14 NOTE — Progress Notes (Signed)
Discharge instructions explained to patient and pt was given a copy of discharge instructions as well as her prescriptions.  Pt verbalized understanding discharge instructions.

## 2013-12-14 NOTE — Discharge Instructions (Signed)
[facility] ______[facility] °LAPAROSCOPIC SURGERY: POST OP INSTRUCTIONS °Always review your discharge instruction sheet given to you by the facility where your surgery was performed. °IF YOU HAVE DISABILITY OR FAMILY LEAVE FORMS, YOU MUST BRING THEM TO THE OFFICE FOR PROCESSING.   °DO NOT GIVE THEM TO YOUR DOCTOR. ° °1. A prescription for pain medication may be given to you upon discharge.  Take your pain medication as prescribed, if needed.  If narcotic pain medicine is not needed, then you may take acetaminophen (Tylenol) or ibuprofen (Advil) as needed. °2. Take your usually prescribed medications unless otherwise directed. °3. If you need a refill on your pain medication, please contact your pharmacy.  They will contact our office to request authorization. Prescriptions will not be filled after 5pm or on week-ends. °4. You should follow a light diet the first few days after arrival home, such as soup and crackers, etc.  Be sure to include lots of fluids daily. °5. Most patients will experience some swelling and bruising in the area of the incisions.  Ice packs will help.  Swelling and bruising can take several days to resolve.  °6. It is common to experience some constipation if taking pain medication after surgery.  Increasing fluid intake and taking a stool softener (such as Colace) will usually help or prevent this problem from occurring.  A mild laxative (Milk of Magnesia or Miralax) should be taken according to package instructions if there are no bowel movements after 48 hours. °7. Unless discharge instructions indicate otherwise, you may remove your bandages 24-48 hours after surgery, and you may shower at that time.  You may have steri-strips (small skin tapes) in place directly over the incision.  These strips should be left on the skin for 7-10 days.  If your surgeon used skin glue on the incision, you may shower in 24 hours.  The glue will flake off over the next 2-3 weeks.  Any sutures or  staples will be removed at the office during your follow-up visit. °8. ACTIVITIES:  You may resume regular (light) daily activities beginning the next day--such as daily self-care, walking, climbing stairs--gradually increasing activities as tolerated.  You may have sexual intercourse when it is comfortable.  Refrain from any heavy lifting or straining until approved by your doctor. °a. You may drive when you are no longer taking prescription pain medication, you can comfortably wear a seatbelt, and you can safely maneuver your car and apply brakes. °b. RETURN TO WORK:  __________________________________________________________ °9. You should see your doctor in the office for a follow-up appointment approximately 2-3 weeks after your surgery.  Make sure that you call for this appointment within a day or two after you arrive home to insure a convenient appointment time. °10. OTHER INSTRUCTIONS: __________________________________________________________________________________________________________________________ __________________________________________________________________________________________________________________________ °WHEN TO CALL YOUR DOCTOR: °1. Fever over 101.0 °2. Inability to urinate °3. Continued bleeding from incision. °4. Increased pain, redness, or drainage from the incision. °5. Increasing abdominal pain ° °The clinic staff is available to answer your questions during regular business hours.  Please don’t hesitate to call and ask to speak to one of the nurses for clinical concerns.  If you have a medical emergency, go to the nearest emergency room or call 911.  A surgeon from [facility] is always on call at the hospital. °[address], [address], [place], [place]  [address] ? [address] [address], [place], [place]   [address] °[phone] ? [phone] ? FAX [phone] °Web site:   [email or web address] °

## 2013-12-14 NOTE — Discharge Summary (Signed)
Physician Discharge Summary  Patient ID: Abigail [name] MRN: [id number] DOB/AGE: 69/02/1946 69 y.o.  Admit date: 12/13/2013 Discharge date: 12/14/2013  Admission Diagnoses:  Discharge Diagnoses:  Active Problems:   HYPOTHYROIDISM   HYPERTENSION   Ventral incisional hernia   Discharged Condition: good  Hospital Course: uneventful postop recovery.  Discharged POD#1  Consults: None  Significant Diagnostic Studies:   Treatments: surgery: lap incisional hernia repair with mesh  Discharge Exam: Blood pressure 148/82, pulse 59, temperature 98.3 F (36.8 C), temperature source Oral, resp. rate 18, height 5\' 5"  (1.651 m), weight 190 lb 0.6 oz (86.2 kg), SpO2 97.00%. General appearance: alert, cooperative and no distress Resp: clear to auscultation bilaterally Cardio: regular rate and rhythm, S1, S2 normal, no murmur, click, rub or gallop Incision/Wound: incisions clean  Disposition: 01-Home or Self Care   Future Appointments Provider Department Dept Phone   12/27/2013 2:30 PM Mariella SaaBenjamin T Hoxworth, MD [facility] [place] [phone]       Medication List         atorvastatin 40 MG tablet  Commonly known as:  LIPITOR  Take 40 mg by mouth every evening.     butalbital-acetaminophen-caffeine 50-325-40 MG per tablet  Commonly known as:  FIORICET, ESGIC  Take 2 tablets by mouth every 6 (six) hours as needed for headache.     butalbital-acetaminophen-caffeine 50-325-40 MG per tablet  Commonly known as:  FIORICET, ESGIC  Take 2 tablets by mouth every 6 (six) hours as needed for headache.     clopidogrel 75 MG tablet  Commonly known as:  PLAVIX  Take 75 mg by mouth every evening.     diclofenac 75 MG EC tablet  Commonly known as:  VOLTAREN  TAKE 1 TABLET BY MOUTH TWICE A DAY     levothyroxine 125 MCG tablet  Commonly known as:  SYNTHROID, LEVOTHROID  Take 125 mcg by mouth daily before breakfast.     oseltamivir 75 MG capsule  Commonly known as:  TAMIFLU  Take 1  capsule (75 mg total) by mouth 2 (two) times daily.     ranitidine 150 MG tablet  Commonly known as:  ZANTAC  Take 150 mg by mouth as needed.     tetrahydrozoline 0.05 % ophthalmic solution  Place 2 drops into both eyes as needed. Red eyes     traMADol 50 MG tablet  Commonly known as:  ULTRAM  Take 1 tablet (50 mg total) by mouth every 6 (six) hours as needed for moderate pain.     traMADol 50 MG tablet  Commonly known as:  ULTRAM  Take 50 mg by mouth every 6 (six) hours as needed for moderate pain.     verapamil 360 MG 24 hr capsule  Commonly known as:  VERELAN PM  Take 360 mg by mouth at bedtime.           Follow-up Information   Follow up with HOXWORTH,BENJAMIN T, MD. Call in 2 weeks.   Specialty:  General Surgery   Contact information:   [address] [place] [address] [phone]       Signed: Shelly RubensteinBLACKMAN,Preston Weill A 12/14/2013, 10:17 AM

## 2013-12-14 NOTE — Progress Notes (Signed)
1 Day Post-Op  Subjective: Feels comfortable  Objective: Vital signs in last 24 hours: Temp:  [98 F (36.7 C)-99.2 F (37.3 C)] 98.3 F (36.8 C) (03/28 0515) Pulse Rate:  [59-80] 59 (03/28 0515) Resp:  [13-20] 18 (03/28 0515) BP: (126-167)/(70-88) 148/82 mmHg (03/28 0515) SpO2:  [94 %-100 %] 97 % (03/28 0515) Weight:  [190 lb 0.6 oz (86.2 kg)] 190 lb 0.6 oz (86.2 kg) (03/28 0010) Last BM Date:  (PTA)  Intake/Output from previous day: 03/27 0701 - 03/28 0700 In: 3900 [P.O.:240; I.V.:3160] Out: 3870 [Urine:3850; Blood:20] Intake/Output this shift: Total I/O In: 120 [P.O.:120] Out: 750 [Urine:750]  Abdomen soft, non distended  Lab Results:   Recent Labs  12/14/13 0440  WBC 8.8  HGB 11.8*  HCT 36.1  PLT 154   BMET No results found for this basename: NA, K, CL, CO2, GLUCOSE, BUN, CREATININE, CALCIUM,  in the last 72 hours PT/INR No results found for this basename: LABPROT, INR,  in the last 72 hours ABG No results found for this basename: PHART, PCO2, PO2, HCO3,  in the last 72 hours  Studies/Results: No results found.  Anti-infectives: Anti-infectives   Start     Dose/Rate Route Frequency Ordered Stop   12/14/13 2300  oseltamivir (TAMIFLU) capsule 75 mg  Status:  Discontinued     75 mg Oral Daily 12/13/13 2226 12/14/13 0014   12/14/13 0030  oseltamivir (TAMIFLU) capsule 75 mg     75 mg Oral 2 times daily 12/14/13 0015 12/18/13 2159   12/13/13 0601  ciprofloxacin (CIPRO) IVPB 400 mg     400 mg 200 mL/hr over 60 Minutes Intravenous On call to O.R. 12/13/13 0601 12/13/13 0750      Assessment/Plan: s/p Procedure(s): LAPAROSCOPIC REPAIR RECURRENT VENTRAL INCISIONAL  HERNIA (N/A) INSERTION OF MESH (N/A)  Stable post op  She wants to go home which I feel is reasonable  LOS: 1 day    Layton Tappan A 12/14/2013

## 2013-12-16 ENCOUNTER — Encounter (HOSPITAL_COMMUNITY): Payer: Self-pay | Admitting: General Surgery

## 2013-12-16 NOTE — Progress Notes (Signed)
Utilization review completed.  

## 2013-12-26 ENCOUNTER — Ambulatory Visit (INDEPENDENT_AMBULATORY_CARE_PROVIDER_SITE_OTHER): Payer: Medicare PPO | Admitting: Internal Medicine

## 2013-12-26 ENCOUNTER — Encounter: Payer: Self-pay | Admitting: Internal Medicine

## 2013-12-26 ENCOUNTER — Telehealth: Payer: Self-pay | Admitting: *Deleted

## 2013-12-26 VITALS — BP 142/90 | Temp 98.3°F | Ht 65.5 in | Wt 185.0 lb

## 2013-12-26 DIAGNOSIS — H9190 Unspecified hearing loss, unspecified ear: Secondary | ICD-10-CM

## 2013-12-26 DIAGNOSIS — H9193 Unspecified hearing loss, bilateral: Secondary | ICD-10-CM

## 2013-12-26 DIAGNOSIS — H938X1 Other specified disorders of right ear: Secondary | ICD-10-CM

## 2013-12-26 DIAGNOSIS — H938X9 Other specified disorders of ear, unspecified ear: Secondary | ICD-10-CM

## 2013-12-26 MED ORDER — BUTALBITAL-APAP-CAFFEINE 50-325-40 MG PO TABS: 2.0000 | ORAL_TABLET | Freq: Four times a day (QID) | ORAL | Status: DC | PRN

## 2013-12-26 NOTE — Telephone Encounter (Signed)
Left message on voicemail to call office.  

## 2013-12-26 NOTE — Patient Instructions (Addendum)
   Fortunately your exam  Is good to day minimal amount of wax and hair in canal.  I suppose could cause this but  Uncertain at this time.  Your hearing is down but at both ears . Will arrange  ent evaluation

## 2013-12-26 NOTE — Telephone Encounter (Signed)
Spoke to pt told her insurance will cover 120 tablets every 30 days for Fioricet. Told pt will call Rx into the pharmacy. Pt verbalized understanding. Rx called into pharmacy.

## 2013-12-26 NOTE — Progress Notes (Signed)
Chief Complaint  Patient presents with  . Ear Problem    Rt ear.  Has a "fluttering" sensation in her rt ear.  Started on Tuesday morning.  . Nasal Congestion  . Post Nasal Drip    HPI: Patient comes in today for SDA for  new acute problem evaluation. pcp na  Onset  2 days nonstop fluttering in right ear.   likes wings in ear. Lasts 5-10 seconds at a time and comes and goes no significant pain. No ringing Some runny nose and pnd . No fever  Hearing no change  Denies hearing changes but could be a little hard of hearing. Remote history of strokes no new symptoms. No weakness or numbness. Just had surgery for umbilical hernia a few weeks ago has followup check tomorrow. No fever headache vision changes. Denies any sinus infection symptoms dizziness or falling. ROS: See pertinent positives and negatives per HPI.  Past Medical History  Diagnosis Date  . Hyperlipidemia   . History of colonic polyps   . Late effect of adverse effect of drug, medicinal or biological substance   . History of tension headache   . Heart palpitations   . Diverticulitis 09-05-11    hx. gastritis, diverticulitis x2 -now surgery planned  . Patent foramen ovale     Small - unable to be closed -   . Hypertension 09-05-11    tx. Verapamil  . Hypothyroidism 09-05-11    Supplement used  . Degenerative joint disease of cervical spine 09-05-11    Cervival area, now some osteoarthritis-lower back and Rt. shoulder  . Osteoarthritis 112-17-12    spine and rt. hip, rt. shoulder  . Sleep apnea 09-05-11    no cpap ever, had surgery to remove cartilage, no problems now  . Stroke 09-05-11    2006/2009-(loss of memory, balance issues remains occ.)  . PONV (postoperative nausea and vomiting)   . Shortness of breath     occasionally  . Headache(784.0)   . Bruises easily   . DDD (degenerative disc disease)   . DIVERTICULITIS, ACUTE 02/10/2010    Qualifier: Diagnosis of  By: Amador CunasKwiatkowski  MD, Janett LabellaPeter F   . URI  09/02/2008    Qualifier: Diagnosis of  By: Amador CunasKwiatkowski  MD, Janett LabellaPeter F   . TIA 09/28/2007    Qualifier: Diagnosis of  By: Amador CunasKwiatkowski  MD, Janett LabellaPeter F   . SORE THROAT 04/30/2009    Qualifier: Diagnosis of  By: Amador CunasKwiatkowski  MD, Janett LabellaPeter F   . NECK PAIN, ACUTE 09/21/2010    Qualifier: Diagnosis of  By: Amador CunasKwiatkowski  MD, Antionette PolesPeter F   . GROIN PAIN 09/21/2010    Qualifier: Diagnosis of  By: Amador CunasKwiatkowski  MD, Janett LabellaPeter F   . CHEST PAIN UNSPECIFIED 05/20/2010    Annotation: Would like to see Dr. Gala RomneyBensimhon. Qualifier: Diagnosis of  By: Carole BinningMeadows CMA AAMA, Debby      No family history on file.  History   Social History  . Marital Status: Married    Spouse Name: N/A    Number of Children: N/A  . Years of Education: N/A   Social History Main Topics  . Smoking status: Former Smoker -- 0.50 packs/day for 5 years    Types: Cigarettes    Quit date: 09/19/1968  . Smokeless tobacco: Never Used  . Alcohol Use: Yes     Comment:  -rare  . Drug Use: No  . Sexual Activity: Yes   Other Topics Concern  . None   Social History  Narrative  . None    Outpatient Encounter Prescriptions as of 12/26/2013  Medication Sig  . atorvastatin (LIPITOR) 40 MG tablet Take 40 mg by mouth every evening.  . butalbital-acetaminophen-caffeine (FIORICET, ESGIC) 50-325-40 MG per tablet Take 2 tablets by mouth every 6 (six) hours as needed for headache.  . butalbital-acetaminophen-caffeine (FIORICET, ESGIC) 50-325-40 MG per tablet Take 2 tablets by mouth every 6 (six) hours as needed for headache.  . clopidogrel (PLAVIX) 75 MG tablet Take 75 mg by mouth every evening.  Marland Kitchen levothyroxine (SYNTHROID, LEVOTHROID) 125 MCG tablet Take 125 mcg by mouth daily before breakfast.  . ranitidine (ZANTAC) 150 MG tablet Take 150 mg by mouth as needed.   Marland Kitchen tetrahydrozoline 0.05 % ophthalmic solution Place 2 drops into both eyes as needed. Red eyes  . traMADol (ULTRAM) 50 MG tablet Take 1 tablet (50 mg total) by mouth every 6 (six) hours as needed for moderate  pain.  . verapamil (VERELAN PM) 360 MG 24 hr capsule Take 360 mg by mouth at bedtime.  . diclofenac (VOLTAREN) 75 MG EC tablet TAKE 1 TABLET BY MOUTH TWICE A DAY  . [DISCONTINUED] oseltamivir (TAMIFLU) 75 MG capsule Take 1 capsule (75 mg total) by mouth 2 (two) times daily.  . [DISCONTINUED] traMADol (ULTRAM) 50 MG tablet Take 50 mg by mouth every 6 (six) hours as needed for moderate pain.    EXAM:  BP 142/90  Temp(Src) 98.3 F (36.8 C) (Oral)  Ht 5' 5.5" (1.664 m)  Wt 185 lb (83.915 kg)  BMI 30.31 kg/m2  Body mass index is 30.31 kg/(m^2).  GENERAL: vitals reviewed and listed above, alert, oriented, appears well hydrated and in no acute distress HEENT: atraumatic, conjunctiva  clear, no obvious abnormalities on inspection of external nose and ears TMs are intact left canal totally clear right canal minimal string of wax a few hairs but nothing against the TM and no foreign body is seen OP : no lesion edema or exudate  NECK: no obvious masses on inspection palpation well healed thyroidectomy scar no bruits are heard LUNGS: clear to auscultation bilaterally, no wheezes, rales or rhonchi, CV: HRRR, no clubbing cyanosis or  peripheral edema nl cap refill  MS: moves all extremities without noticeable focal  abnormality PSYCH: pleasant and cooperative,  Hearing screen could not hear at 20 dB except for right ear at high frequency rest 40 dB bilaterally ASSESSMENT AND PLAN:  Discussed the following assessment and plan:  flutter sound in right ear - minimal wax some hair no explantion   no evkednece of vascular neuro if continue s - Plan: Ambulatory referral to ENT  Decreased hearing of both ears - Predated the above symptoms - Plan: Ambulatory referral to ENT Because of both symptoms would get ENT evaluation nonurgently she is a name of an ENT she is seen in the past would like to go to them will contact us with the name. This may go away on its and it would have her hearing evaluation  anyway. -Patient advised to return or notify health care team  if symptoms worsen ,persist or new concerns arise.  Patient Instructions    Fortunately your exam  Is good to day minimal amount of wax and hair in canal.  I suppose could cause this but  Uncertain at this time.  Your hearing is down but at both ears . Will arrange  ent evaluation      Neta Mends. Loui Massenburg M.D.  Pre visit review using our clinic review  tool, if applicable. No additional management support is needed unless otherwise documented below in the visit note.

## 2013-12-27 ENCOUNTER — Encounter (INDEPENDENT_AMBULATORY_CARE_PROVIDER_SITE_OTHER): Payer: Self-pay | Admitting: General Surgery

## 2013-12-27 ENCOUNTER — Ambulatory Visit (INDEPENDENT_AMBULATORY_CARE_PROVIDER_SITE_OTHER): Payer: Medicare PPO | Admitting: General Surgery

## 2013-12-27 VITALS — BP 124/80 | HR 77 | Temp 97.0°F | Ht 65.0 in | Wt 186.0 lb

## 2013-12-27 DIAGNOSIS — Z09 Encounter for follow-up examination after completed treatment for conditions other than malignant neoplasm: Secondary | ICD-10-CM

## 2013-12-27 NOTE — Progress Notes (Signed)
History: Patient returns 2 weeks following laparoscopic repair of recurrent ventral hernia. She is doing pretty well. She has some soreness and some occasional burning with more strenuous activity but generally is improving without major complaints.  Exam: BP 124/80  Pulse 77  Temp(Src) 97 F (36.1 C) (Oral)  Ht 5\' 5"  (1.651 m)  Wt 186 lb (84.369 kg)  BMI 30.95 kg/m2 General: Appears well Abdomen: All incisions are healing nicely. No seroma or early recurrence or other complication.  Assessment and plan: Doing well following laparoscopic repair of ventral hernia. Return in one month for what should be a final check.

## 2014-01-17 ENCOUNTER — Encounter: Payer: Self-pay | Admitting: Internal Medicine

## 2014-01-17 ENCOUNTER — Ambulatory Visit (INDEPENDENT_AMBULATORY_CARE_PROVIDER_SITE_OTHER): Payer: Medicare PPO | Admitting: Internal Medicine

## 2014-01-17 ENCOUNTER — Ambulatory Visit: Payer: Medicare PPO | Admitting: Internal Medicine

## 2014-01-17 VITALS — BP 136/80 | HR 55 | Temp 98.1°F | Resp 20 | Ht 65.0 in | Wt 185.0 lb

## 2014-01-17 DIAGNOSIS — M545 Low back pain, unspecified: Secondary | ICD-10-CM

## 2014-01-17 DIAGNOSIS — I10 Essential (primary) hypertension: Secondary | ICD-10-CM

## 2014-01-17 NOTE — Progress Notes (Signed)
Pre-visit discussion using our clinic review tool. No additional management support is needed unless otherwise documented below in the visit note.  

## 2014-01-17 NOTE — Progress Notes (Signed)
Subjective:    Patient ID: Abigail [name], female    DOB: Apr 19, 1946, 68 y.o.   MRN: [id number]  HPI  68 year old patient who has a prior history of low back pain.  She is treated hypertension.  She has had recent repair of a ventral hernia.  Complaints include some pain in the low back region.  She also describes some mild left medial elbow discomfort. She has treated hypertension, which has been stable  Past Medical History  Diagnosis Date  . Hyperlipidemia   . History of colonic polyps   . Late effect of adverse effect of drug, medicinal or biological substance   . History of tension headache   . Heart palpitations   . Diverticulitis 09-05-11    hx. gastritis, diverticulitis x2 -now surgery planned  . Patent foramen ovale     Small - unable to be closed -   . Hypertension 09-05-11    tx. Verapamil  . Hypothyroidism 09-05-11    Supplement used  . Degenerative joint disease of cervical spine 09-05-11    Cervival area, now some osteoarthritis-lower back and Rt. shoulder  . Osteoarthritis 112-17-12    spine and rt. hip, rt. shoulder  . Sleep apnea 09-05-11    no cpap ever, had surgery to remove cartilage, no problems now  . Stroke 09-05-11    2006/2009-(loss of memory, balance issues remains occ.)  . PONV (postoperative nausea and vomiting)   . Shortness of breath     occasionally  . Headache(784.0)   . Bruises easily   . DDD (degenerative disc disease)   . DIVERTICULITIS, ACUTE 02/10/2010    Qualifier: Diagnosis of  By: Amador Cunas  MD, Janett Labella   . URI 09/02/2008    Qualifier: Diagnosis of  By: Amador Cunas  MD, Janett Labella   . TIA 09/28/2007    Qualifier: Diagnosis of  By: Amador Cunas  MD, Janett Labella   . SORE THROAT 04/30/2009    Qualifier: Diagnosis of  By: Amador Cunas  MD, Janett Labella   . NECK PAIN, ACUTE 09/21/2010    Qualifier: Diagnosis of  By: Amador Cunas  MD, Antionette Poles PAIN 09/21/2010    Qualifier: Diagnosis of  By: Amador Cunas  MD, Janett Labella   . CHEST PAIN UNSPECIFIED  05/20/2010    Annotation: Would like to see Dr. Gala Romney. Qualifier: Diagnosis of  By: Carole Binning CMA AAMA, Debby      History   Social History  . Marital Status: Married    Spouse Name: N/A    Number of Children: N/A  . Years of Education: N/A   Occupational History  . Not on file.   Social History Main Topics  . Smoking status: Former Smoker -- 0.50 packs/day for 5 years    Types: Cigarettes    Quit date: 09/19/1968  . Smokeless tobacco: Never Used  . Alcohol Use: Yes     Comment:  -rare  . Drug Use: No  . Sexual Activity: Yes   Other Topics Concern  . Not on file   Social History Narrative  . No narrative on file    Past Surgical History  Procedure Laterality Date  . Abdominal hysterectomy    . Thyroidectomy, partial  09-05-11  . Elbow surgery  09-05-11    left elbow -ligament repair  . Umbilical hernia repair  yrs ago  . Colon resection  09/07/2011    Procedure: LAPAROSCOPIC SIGMOID COLON RESECTION;  Surgeon: Mariella Saa, MD;  Location: [facility];  Service: General;  Laterality: N/A;  with proctoscopy  . Laparoscopy  09/07/2011    Procedure: LAPAROSCOPY DIAGNOSTIC;  Surgeon: Roselle LocusJames E Tomblin II;  Location: [facility];  Service: Gynecology;  Laterality: N/A;  . Salpingoophorectomy  09/07/2011    Procedure: SALPINGO OOPHERECTOMY;  Surgeon: Roselle LocusJames E Tomblin II;  Location: [facility];  Service: Gynecology;  Laterality: Right;  . Tubal ligation  1983  . Ventral hernia repair  06/20/2012    Procedure: LAPAROSCOPIC VENTRAL HERNIA;  Surgeon: Mariella SaaBenjamin T Hoxworth, MD;  Location: [facility];  Service: General;  Laterality: N/A;  Laparoscopic Repair of Ventral Hernia with mesh  . Ventral hernia repair N/A 12/13/2013    Procedure: LAPAROSCOPIC REPAIR RECURRENT VENTRAL INCISIONAL  HERNIA;  Surgeon: Mariella SaaBenjamin T Hoxworth, MD;  Location: [facility];  Service: General;  Laterality: N/A;  . Insertion of mesh N/A 12/13/2013    Procedure: INSERTION OF MESH;  Surgeon: Mariella SaaBenjamin T Hoxworth, MD;  Location:  [facility];  Service: General;  Laterality: N/A;    History reviewed. No pertinent family history.  Allergies  Allergen Reactions  . Aggrenox [Aspirin-Dipyridamole Er] Nausea And Vomiting and Other (See Comments)    Headache   . Dilaudid [Hydromorphone Hcl] Shortness Of Breath and Nausea And Vomiting    Able to take morphine without issue  . Shellfish Allergy Shortness Of Breath and Rash    Only shrimp allergy  . Codeine Phosphate Nausea And Vomiting    REACTION: unspecified  . Hydrocodone Nausea And Vomiting    Can take morphine without issue  . Meperidine Hcl Nausea And Vomiting  . Percocet [Oxycodone-Acetaminophen] Nausea And Vomiting    Can take morphine without issue  . Cephalexin Itching and Rash    Current Outpatient Prescriptions on File Prior to Visit  Medication Sig Dispense Refill  . atorvastatin (LIPITOR) 40 MG tablet Take 40 mg by mouth every evening.      . butalbital-acetaminophen-caffeine (FIORICET, ESGIC) 50-325-40 MG per tablet Take 2 tablets by mouth every 6 (six) hours as needed for headache.  120 tablet  2  . clopidogrel (PLAVIX) 75 MG tablet Take 75 mg by mouth every evening.      Marland Kitchen. levothyroxine (SYNTHROID, LEVOTHROID) 125 MCG tablet Take 125 mcg by mouth daily before breakfast.      . tetrahydrozoline 0.05 % ophthalmic solution Place 2 drops into both eyes as needed. Red eyes      . traMADol (ULTRAM) 50 MG tablet Take 1 tablet (50 mg total) by mouth every 6 (six) hours as needed for moderate pain.  30 tablet  0  . verapamil (VERELAN PM) 360 MG 24 hr capsule Take 360 mg by mouth at bedtime.      . diclofenac (VOLTAREN) 75 MG EC tablet TAKE 1 TABLET BY MOUTH TWICE A DAY  180 tablet  0   No current facility-administered medications on file prior to visit.    BP 136/80  Pulse 55  Temp(Src) 98.1 F (36.7 C) (Oral)  Resp 20  Ht 5\' 5"  (1.651 m)  Wt 185 lb (83.915 kg)  BMI 30.79 kg/m2  SpO2 99%       Review of Systems  Constitutional: Negative.     HENT: Negative for congestion, dental problem, hearing loss, rhinorrhea, sinus pressure, sore throat and tinnitus.   Eyes: Negative for pain, discharge and visual disturbance.  Respiratory: Negative for cough and shortness of breath.   Cardiovascular: Negative for chest pain, palpitations and leg swelling.  Gastrointestinal: Negative for nausea, vomiting, abdominal pain,  diarrhea, constipation, blood in stool and abdominal distention.  Genitourinary: Negative for dysuria, urgency, frequency, hematuria, flank pain, vaginal bleeding, vaginal discharge, difficulty urinating, vaginal pain and pelvic pain.  Musculoskeletal: Positive for arthralgias and back pain. Negative for gait problem and joint swelling.  Skin: Negative for rash.  Neurological: Negative for dizziness, syncope, speech difficulty, weakness, numbness and headaches.  Hematological: Negative for adenopathy.  Psychiatric/Behavioral: Negative for behavioral problems, dysphoric mood and agitation. The patient is not nervous/anxious.        Objective:   Physical Exam  Constitutional: She is oriented to person, place, and time. She appears well-developed and well-nourished.  HENT:  Head: Normocephalic.  Right Ear: External ear normal.  Left Ear: External ear normal.  Mouth/Throat: Oropharynx is clear and moist.  Eyes: Conjunctivae and EOM are normal. Pupils are equal, round, and reactive to light.  Neck: Normal range of motion. Neck supple. No thyromegaly present.  Cardiovascular: Normal rate, regular rhythm, normal heart sounds and intact distal pulses.   Pulmonary/Chest: Effort normal and breath sounds normal.  Abdominal: Soft. Bowel sounds are normal. She exhibits no mass. There is no tenderness.  Musculoskeletal: Normal range of motion.  Mild tenderness over the  medial condyle, left elbow Negative straight leg test Full range of motion both hips No tenderness over the greater trochanter  Lymphadenopathy:    She has no  cervical adenopathy.  Neurological: She is alert and oriented to person, place, and time.  Skin: Skin is warm and dry. No rash noted.  Psychiatric: She has a normal mood and affect. Her behavior is normal.          Assessment & Plan:   Nonspecific low back pain.  Patient does have a prescription for diclofenac, as well as tramadol Hypertension well controlled

## 2014-01-17 NOTE — Patient Instructions (Signed)
Limit your sodium (Salt) intake  Please check your blood pressure on a regular basis.  If it is consistently greater than 150/90, please make an office appointment.  Most patients with low back pain will improve with time over the next two to 6 weeks.  Keep active but avoid any activities that cause pain.  Apply moist heat to the low back area several times daily.Back Injury Prevention Back injuries can be extremely painful and difficult to heal. After having one back injury, you are much more likely to experience another later on. It is important to learn how to avoid injuring or re-injuring your back. The following tips can help you to prevent a back injury. PHYSICAL FITNESS  Exercise regularly and try to develop good tone in your abdominal muscles. Your abdominal muscles provide a lot of the support needed by your back.  Do aerobic exercises (walking, jogging, biking, swimming) regularly.  Do exercises that increase balance and strength (tai chi, yoga) regularly. This can decrease your risk of falling and injuring your back.  Stretch before and after exercising.  Maintain a healthy weight. The more you weigh, the more stress is placed on your back. For every pound of weight, 10 times that amount of pressure is placed on the back. DIET  Talk to your caregiver about how much calcium and vitamin D you need per day. These nutrients help to prevent weakening of the bones (osteoporosis). Osteoporosis can cause broken (fractured) bones that lead to back pain.  Include good sources of calcium in your diet, such as dairy products, green, leafy vegetables, and products with calcium added (fortified).  Include good sources of vitamin D in your diet, such as milk and foods that are fortified with vitamin D.  Consider taking a nutritional supplement or a multivitamin if needed.  Stop smoking if you smoke. POSTURE  Sit and stand up straight. Avoid leaning forward when you sit or hunching over when  you stand.  Choose chairs with good low back (lumbar) support.  If you work at a desk, sit close to your work so you do not need to lean over. Keep your chin tucked in. Keep your neck drawn back and elbows bent at a right angle. Your arms should look like the letter "L."  Sit high and close to the steering wheel when you drive. Add a lumbar support to your car seat if needed.  Avoid sitting or standing in one position for too long. Take breaks to get up, stretch, and walk around at least once every hour. Take breaks if you are driving for long periods of time.  Sleep on your side with your knees slightly bent, or sleep on your back with a pillow under your knees. Do not sleep on your stomach. LIFTING, TWISTING, AND REACHING  Avoid heavy lifting, especially repetitive lifting. If you must do heavy lifting:  Stretch before lifting.  Work slowly.  Rest between lifts.  Use carts and dollies to move objects when possible.  Make several small trips instead of carrying 1 heavy load.  Ask for help when you need it.  Ask for help when moving big, awkward objects.  Follow these steps when lifting:  Stand with your feet shoulder-width apart.  Get as close to the object as you can. Do not try to pick up heavy objects that are far from your body.  Use handles or lifting straps if they are available.  Bend at your knees. Squat down, but keep your heels off  the floor.  Keep your shoulders pulled back, your chin tucked in, and your back straight.  Lift the object slowly, tightening the muscles in your legs, abdomen, and buttocks. Keep the object as close to the center of your body as possible.  When you put a load down, use these same guidelines in reverse.  Do not:  Lift the object above your waist.  Twist at the waist while lifting or carrying a load. Move your feet if you need to turn, not your waist.  Bend over without bending at your knees.  Avoid reaching over your head,  across a table, or for an object on a high surface. OTHER TIPS  Avoid wet floors and keep sidewalks clear of ice to prevent falls.  Do not sleep on a mattress that is too soft or too hard.  Keep items that are used frequently within easy reach.  Put heavier objects on shelves at waist level and lighter objects on lower or higher shelves.  Find ways to decrease your stress, such as exercise, massage, or relaxation techniques. Stress can build up in your muscles. Tense muscles are more vulnerable to injury.  Seek treatment for depression or anxiety if needed. These conditions can increase your risk of developing back pain. SEEK MEDICAL CARE IF:  You injure your back.  You have questions about diet, exercise, or other ways to prevent back injuries. MAKE SURE YOU:  Understand these instructions.  Will watch your condition.  Will get help right away if you are not doing well or get worse. Document Released: 10/13/2004 Document Revised: 11/28/2011 Document Reviewed: 10/17/2011 [facility] Patient Information 2014 [place], [place]. Lumbosacral Strain Lumbosacral strain is a strain of any of the parts that make up your lumbosacral vertebrae. Your lumbosacral vertebrae are the bones that make up the lower third of your backbone. Your lumbosacral vertebrae are held together by muscles and tough, fibrous tissue (ligaments).  CAUSES  A sudden blow to your back can cause lumbosacral strain. Also, anything that causes an excessive stretch of the muscles in the low back can cause this strain. This is typically seen when people exert themselves strenuously, fall, lift heavy objects, bend, or crouch repeatedly. RISK FACTORS  Physically demanding work.  Participation in pushing or pulling sports or sports that require sudden twist of the back (tennis, golf, baseball).  Weight lifting.  Excessive lower back curvature.  Forward-tilted pelvis.  Weak back or abdominal muscles or both.  Tight  hamstrings. SIGNS AND SYMPTOMS  Lumbosacral strain may cause pain in the area of your injury or pain that moves (radiates) down your leg.  DIAGNOSIS Your health care provider can often diagnose lumbosacral strain through a physical exam. In some cases, you may need tests such as X-ray exams.  TREATMENT  Treatment for your lower back injury depends on many factors that your clinician will have to evaluate. However, most treatment will include the use of anti-inflammatory medicines. HOME CARE INSTRUCTIONS   Avoid hard physical activities (tennis, racquetball, waterskiing) if you are not in proper physical condition for it. This may aggravate or create problems.  If you have a back problem, avoid sports requiring sudden body movements. Swimming and walking are generally safer activities.  Maintain good posture.  Maintain a healthy weight.  For acute conditions, you may put ice on the injured area.  Put ice in a plastic bag.  Place a towel between your skin and the bag.  Leave the ice on for 20 minutes, 2 3 times  a day.  When the low back starts healing, stretching and strengthening exercises may be recommended. SEEK MEDICAL CARE IF:  Your back pain is getting worse.  You experience severe back pain not relieved with medicines. SEEK IMMEDIATE MEDICAL CARE IF:   You have numbness, tingling, weakness, or problems with the use of your arms or legs.  There is a change in bowel or bladder control.  You have increasing pain in any area of the body, including your belly (abdomen).  You notice shortness of breath, dizziness, or feel faint.  You feel sick to your stomach (nauseous), are throwing up (vomiting), or become sweaty.  You notice discoloration of your toes or legs, or your feet get very cold. MAKE SURE YOU:   Understand these instructions.  Will watch your condition.  Will get help right away if you are not doing well or get worse. Document Released: 06/15/2005  Document Revised: 06/26/2013 Document Reviewed: 04/24/2013 [facility] Patient Information 2014 [place], [place]. In the ER

## 2014-01-24 ENCOUNTER — Telehealth (INDEPENDENT_AMBULATORY_CARE_PROVIDER_SITE_OTHER): Payer: Self-pay

## 2014-01-24 NOTE — Telephone Encounter (Signed)
[facility] [phone], fax confirmation rec'd.  Copy mailed to patient

## 2014-01-24 NOTE — Telephone Encounter (Signed)
Message copied by Maryan PulsMOORE, Shanitra Phillippi on Fri Jan 24, 2014  6:28 PM ------      Message from: Kerrin ChampagneLOWERY, TENA M      Created: Tue Jan 14, 2014  3:50 PM      Contact: [phone]       Did you mail her the form that you and the pt discussed, she is concerned because it has been 2 weeks. She has not received anything. If not could you fax the form. [phone] cell number also. ------

## 2014-02-04 ENCOUNTER — Ambulatory Visit (INDEPENDENT_AMBULATORY_CARE_PROVIDER_SITE_OTHER): Payer: Medicare PPO | Admitting: Internal Medicine

## 2014-02-04 ENCOUNTER — Encounter: Payer: Self-pay | Admitting: Internal Medicine

## 2014-02-04 ENCOUNTER — Encounter (INDEPENDENT_AMBULATORY_CARE_PROVIDER_SITE_OTHER): Payer: Medicare PPO | Admitting: General Surgery

## 2014-02-04 VITALS — BP 140/90 | HR 65 | Temp 99.0°F | Resp 20 | Ht 65.0 in | Wt 181.0 lb

## 2014-02-04 DIAGNOSIS — L720 Epidermal cyst: Secondary | ICD-10-CM

## 2014-02-04 DIAGNOSIS — L723 Sebaceous cyst: Secondary | ICD-10-CM

## 2014-02-04 MED ORDER — CIPROFLOXACIN HCL 500 MG PO TABS: 500.0000 mg | ORAL_TABLET | Freq: Two times a day (BID) | ORAL | Status: DC

## 2014-02-04 NOTE — Patient Instructions (Signed)
Sitz baths as discussed Take your antibiotic as prescribed until ALL of it is gone, but stop if you develop a rash, swelling, or any side effects of the medication.  Contact our office as soon as possible if  there are side effects of the medication.  Call or return to clinic prn if these symptoms worsen or fail to improve as anticipated.  Sitz Bath A sitz bath is a warm water bath taken in the sitting position that covers only the hips and buttocks. It may be used for either healing or hygiene purposes. Sitz baths are also used to relieve pain, itching, or muscle spasms. The water may contain medicine. Moist heat will help you heal and relax.  HOME CARE INSTRUCTIONS  Take 3 to 4 sitz baths a day. 1. Fill the bathtub half full with warm water. 2. Sit in the water and open the drain a little. 3. Turn on the warm water to keep the tub half full. Keep the water running constantly. 4. Soak in the water for 15 to 20 minutes. 5. After the sitz bath, pat the affected area dry first. SEEK MEDICAL CARE IF:  You get worse instead of better. Stop the sitz baths if you get worse. MAKE SURE YOU:  Understand these instructions.  Will watch your condition.  Will get help right away if you are not doing well or get worse. Document Released: 05/28/2004 Document Revised: 05/30/2012 Document Reviewed: 12/03/2010 [facility] Group[facility] Patient Information 2014 [place]ExitCare, [place]LL[place]. Epidermal Cyst An epidermal cyst is sometimes called a sebaceous cyst, epidermal inclusion cyst, or infundibular cyst. These cysts usually contain a substance that looks "pasty" or "cheesy" and may have a bad smell. This substance is a protein called keratin. Epidermal cysts are usually found on the face, neck, or trunk. They may also occur in the vaginal area or other parts of the genitalia of both men and women. Epidermal cysts are usually small, painless, slow-growing bumps or lumps that move freely under the skin. It is important not to try to pop  them. This may cause an infection and lead to tenderness and swelling. CAUSES  Epidermal cysts may be caused by a deep penetrating injury to the skin or a plugged hair follicle, often associated with acne. SYMPTOMS  Epidermal cysts can become inflamed and cause:  Redness.  Tenderness.  Increased temperature of the skin over the bumps or lumps.  Grayish-white, bad smelling material that drains from the bump or lump. DIAGNOSIS  Epidermal cysts are easily diagnosed by your caregiver during an exam. Rarely, a tissue sample (biopsy) may be taken to rule out other conditions that may resemble epidermal cysts. TREATMENT   Epidermal cysts often get better and disappear on their own. They are rarely ever cancerous.  If a cyst becomes infected, it may become inflamed and tender. This may require opening and draining the cyst. Treatment with antibiotics may be necessary. When the infection is gone, the cyst may be removed with minor surgery.  Small, inflamed cysts can often be treated with antibiotics or by injecting steroid medicines.  Sometimes, epidermal cysts become large and bothersome. If this happens, surgical removal in your caregiver's office may be necessary. HOME CARE INSTRUCTIONS  Only take over-the-counter or prescription medicines as directed by your caregiver.  Take your antibiotics as directed. Finish them even if you start to feel better. SEEK MEDICAL CARE IF:   Your cyst becomes tender, red, or swollen.  Your condition is not improving or is getting worse.  You  have any other questions or concerns. MAKE SURE YOU:  Understand these instructions.  Will watch your condition.  Will get help right away if you are not doing well or get worse. Document Released: 08/06/2004 Document Revised: 11/28/2011 Document Reviewed: 03/14/2011 [facility] Patient Information 2014 [place]ExitCare, [place]LLC.

## 2014-02-04 NOTE — Progress Notes (Signed)
Pre-visit discussion using our clinic review tool. No additional management support is needed unless otherwise documented below in the visit note.  

## 2014-02-04 NOTE — Progress Notes (Signed)
Subjective:    Patient ID: Abigail [name], female    DOB: 05/25/1946, 68 y.o.   MRN: [id number]  HPI  68 year old patient, who presents with a chief complaint of a painful nodule involving the right buttock area.  She has had infected epidermoid cysts in the past.  3 days ago, there was some minimal draining of the lesion.  No fever, or other constitutional complaints.  Past Medical History  Diagnosis Date  . Hyperlipidemia   . History of colonic polyps   . Late effect of adverse effect of drug, medicinal or biological substance   . History of tension headache   . Heart palpitations   . Diverticulitis 09-05-11    hx. gastritis, diverticulitis x2 -now surgery planned  . Patent foramen ovale     Small - unable to be closed -   . Hypertension 09-05-11    tx. Verapamil  . Hypothyroidism 09-05-11    Supplement used  . Degenerative joint disease of cervical spine 09-05-11    Cervival area, now some osteoarthritis-lower back and Rt. shoulder  . Osteoarthritis 112-17-12    spine and rt. hip, rt. shoulder  . Sleep apnea 09-05-11    no cpap ever, had surgery to remove cartilage, no problems now  . Stroke 09-05-11    2006/2009-(loss of memory, balance issues remains occ.)  . PONV (postoperative nausea and vomiting)   . Shortness of breath     occasionally  . Headache(784.0)   . Bruises easily   . DDD (degenerative disc disease)   . DIVERTICULITIS, ACUTE 02/10/2010    Qualifier: Diagnosis of  By: Amador CunasKwiatkowski  MD, Janett LabellaPeter F   . URI 09/02/2008    Qualifier: Diagnosis of  By: Amador CunasKwiatkowski  MD, Janett LabellaPeter F   . TIA 09/28/2007    Qualifier: Diagnosis of  By: Amador CunasKwiatkowski  MD, Janett LabellaPeter F   . SORE THROAT 04/30/2009    Qualifier: Diagnosis of  By: Amador CunasKwiatkowski  MD, Janett LabellaPeter F   . NECK PAIN, ACUTE 09/21/2010    Qualifier: Diagnosis of  By: Amador CunasKwiatkowski  MD, Antionette PolesPeter F   . GROIN PAIN 09/21/2010    Qualifier: Diagnosis of  By: Amador CunasKwiatkowski  MD, Janett LabellaPeter F   . CHEST PAIN UNSPECIFIED 05/20/2010    Annotation: Would like to see  Dr. Gala RomneyBensimhon. Qualifier: Diagnosis of  By: Carole BinningMeadows CMA AAMA, Debby      History   Social History  . Marital Status: Married    Spouse Name: N/A    Number of Children: N/A  . Years of Education: N/A   Occupational History  . Not on file.   Social History Main Topics  . Smoking status: Former Smoker -- 0.50 packs/day for 5 years    Types: Cigarettes    Quit date: 09/19/1968  . Smokeless tobacco: Never Used  . Alcohol Use: Yes     Comment:  -rare  . Drug Use: No  . Sexual Activity: Yes   Other Topics Concern  . Not on file   Social History Narrative  . No narrative on file    Past Surgical History  Procedure Laterality Date  . Abdominal hysterectomy    . Thyroidectomy, partial  09-05-11  . Elbow surgery  09-05-11    left elbow -ligament repair  . Umbilical hernia repair  yrs ago  . Colon resection  09/07/2011    Procedure: LAPAROSCOPIC SIGMOID COLON RESECTION;  Surgeon: Mariella SaaBenjamin T Hoxworth, MD;  Location: [facility];  Service: General;  Laterality: N/A;  with  proctoscopy  . Laparoscopy  09/07/2011    Procedure: LAPAROSCOPY DIAGNOSTIC;  Surgeon: Roselle Locus II;  Location: [facility];  Service: Gynecology;  Laterality: N/A;  . Salpingoophorectomy  09/07/2011    Procedure: SALPINGO OOPHERECTOMY;  Surgeon: Roselle Locus II;  Location: [facility];  Service: Gynecology;  Laterality: Right;  . Tubal ligation  1983  . Ventral hernia repair  06/20/2012    Procedure: LAPAROSCOPIC VENTRAL HERNIA;  Surgeon: Mariella Saa, MD;  Location: [facility];  Service: General;  Laterality: N/A;  Laparoscopic Repair of Ventral Hernia with mesh  . Ventral hernia repair N/A 12/13/2013    Procedure: LAPAROSCOPIC REPAIR RECURRENT VENTRAL INCISIONAL  HERNIA;  Surgeon: Mariella Saa, MD;  Location: [facility];  Service: General;  Laterality: N/A;  . Insertion of mesh N/A 12/13/2013    Procedure: INSERTION OF MESH;  Surgeon: Mariella Saa, MD;  Location: [facility];  Service: General;  Laterality:  N/A;    History reviewed. No pertinent family history.  Allergies  Allergen Reactions  . Aggrenox [Aspirin-Dipyridamole Er] Nausea And Vomiting and Other (See Comments)    Headache   . Dilaudid [Hydromorphone Hcl] Shortness Of Breath and Nausea And Vomiting    Able to take morphine without issue  . Shellfish Allergy Shortness Of Breath and Rash    Only shrimp allergy  . Codeine Phosphate Nausea And Vomiting    REACTION: unspecified  . Hydrocodone Nausea And Vomiting    Can take morphine without issue  . Meperidine Hcl Nausea And Vomiting  . Percocet [Oxycodone-Acetaminophen] Nausea And Vomiting    Can take morphine without issue  . Cephalexin Itching and Rash    Current Outpatient Prescriptions on File Prior to Visit  Medication Sig Dispense Refill  . atorvastatin (LIPITOR) 40 MG tablet Take 40 mg by mouth every evening.      . butalbital-acetaminophen-caffeine (FIORICET, ESGIC) 50-325-40 MG per tablet Take 2 tablets by mouth every 6 (six) hours as needed for headache.  120 tablet  2  . clopidogrel (PLAVIX) 75 MG tablet Take 75 mg by mouth every evening.      . diclofenac (VOLTAREN) 75 MG EC tablet TAKE 1 TABLET BY MOUTH TWICE A DAY  180 tablet  0  . levothyroxine (SYNTHROID, LEVOTHROID) 125 MCG tablet Take 125 mcg by mouth daily before breakfast.      . tetrahydrozoline 0.05 % ophthalmic solution Place 2 drops into both eyes as needed. Red eyes      . traMADol (ULTRAM) 50 MG tablet Take 1 tablet (50 mg total) by mouth every 6 (six) hours as needed for moderate pain.  30 tablet  0  . verapamil (VERELAN PM) 360 MG 24 hr capsule Take 360 mg by mouth at bedtime.       No current facility-administered medications on file prior to visit.    BP 140/90  Pulse 65  Temp(Src) 99 F (37.2 C) (Oral)  Resp 20  Ht 5\' 5"  (1.651 m)  Wt 181 lb (82.101 kg)  BMI 30.12 kg/m2  SpO2 98%       Review of Systems  Constitutional: Negative for fever, chills, diaphoresis, activity change,  appetite change and fatigue.  Skin: Positive for wound.       Objective:   Physical Exam  Constitutional: She is oriented to person, place, and time. She appears well-developed and well-nourished. No distress.  Neck: Normal range of motion. No thyromegaly present.  Pulmonary/Chest: Breath sounds normal.  Abdominal: She exhibits no mass.  There is no tenderness.  Lymphadenopathy:    She has no cervical adenopathy.  Neurological: She is alert and oriented to person, place, and time.  Skin: No rash noted.  A 2 cm, firm, slightly tender nodule noted involving the right buttock area near the intergluteal region.  No fluctuance or drainage  Psychiatric: She has a normal mood and affect. Her behavior is normal.          Assessment & Plan:   Infected epidermoid cyst. Sitz baths, and antibiotic therapy will be initiated.  She states that she is allergic to cephalexin and has not tolerated doxycycline well.  Will place on Cipro for 7 days

## 2014-02-08 ENCOUNTER — Other Ambulatory Visit: Payer: Self-pay | Admitting: Internal Medicine

## 2014-02-08 ENCOUNTER — Other Ambulatory Visit (INDEPENDENT_AMBULATORY_CARE_PROVIDER_SITE_OTHER): Payer: Self-pay | Admitting: Surgery

## 2014-02-11 ENCOUNTER — Other Ambulatory Visit: Payer: Self-pay

## 2014-02-11 MED ORDER — TRAMADOL HCL 50 MG PO TABS: ORAL_TABLET | ORAL | Status: DC

## 2014-02-11 NOTE — Telephone Encounter (Signed)
This an Dr Johna Sheriff patient

## 2014-02-12 ENCOUNTER — Telehealth (INDEPENDENT_AMBULATORY_CARE_PROVIDER_SITE_OTHER): Payer: Self-pay | Admitting: General Surgery

## 2014-02-12 NOTE — Telephone Encounter (Signed)
Spoke with pt and she informed us that another physician went ahead and signed off on her Rx for tramadol.  We cancelled the order that was written for Tramadol.

## 2014-02-16 ENCOUNTER — Other Ambulatory Visit (INDEPENDENT_AMBULATORY_CARE_PROVIDER_SITE_OTHER): Payer: Self-pay | Admitting: Surgery

## 2014-02-17 NOTE — Telephone Encounter (Signed)
Can this patient have this Rx 

## 2014-02-25 ENCOUNTER — Other Ambulatory Visit: Payer: Self-pay | Admitting: Internal Medicine

## 2014-02-25 ENCOUNTER — Telehealth: Payer: Self-pay | Admitting: Internal Medicine

## 2014-02-25 NOTE — Telephone Encounter (Signed)
Pt is needing new rx tramadol, pt states dr. Kirtland Bouchard had written it before but its been awhile. Pharmacy needs new rx sent to [facility]

## 2014-02-25 NOTE — Telephone Encounter (Signed)
Rx phoned into pharmacy.

## 2014-03-10 ENCOUNTER — Ambulatory Visit (INDEPENDENT_AMBULATORY_CARE_PROVIDER_SITE_OTHER): Payer: Medicare PPO | Admitting: Internal Medicine

## 2014-03-10 ENCOUNTER — Encounter: Payer: Self-pay | Admitting: Internal Medicine

## 2014-03-10 VITALS — BP 140/80 | HR 61 | Temp 98.4°F | Resp 20 | Ht 65.0 in | Wt 185.0 lb

## 2014-03-10 DIAGNOSIS — L723 Sebaceous cyst: Secondary | ICD-10-CM

## 2014-03-10 DIAGNOSIS — I10 Essential (primary) hypertension: Secondary | ICD-10-CM

## 2014-03-10 MED ORDER — AMOXICILLIN-POT CLAVULANATE 875-125 MG PO TABS: 1.0000 | ORAL_TABLET | Freq: Two times a day (BID) | ORAL | Status: DC

## 2014-03-10 NOTE — Patient Instructions (Signed)
Call or return to clinic prn if these symptoms worsen or fail to improve as anticipated.  Sitz Bath A sitz bath is a warm water bath taken in the sitting position that covers only the hips and buttocks. It may be used for either healing or hygiene purposes. Sitz baths are also used to relieve pain, itching, or muscle spasms. The water may contain medicine. Moist heat will help you heal and relax.  HOME CARE INSTRUCTIONS  Take 3 to 4 sitz baths a day. 1. Fill the bathtub half full with warm water. 2. Sit in the water and open the drain a little. 3. Turn on the warm water to keep the tub half full. Keep the water running constantly. 4. Soak in the water for 15 to 20 minutes. 5. After the sitz bath, pat the affected area dry first. SEEK MEDICAL CARE IF:  You get worse instead of better. Stop the sitz baths if you get worse. MAKE SURE YOU:  Understand these instructions.  Will watch your condition.  Will get help right away if you are not doing well or get worse. Document Released: 05/28/2004 Document Revised: 05/30/2012 Document Reviewed: 12/03/2010 [facility]allExitCare Patient Information 2015 [place]ExitCare, [place]LLC. This information is not intended to replace advice given to you by your health care provider. Make sure you discuss any questions you have with your health care provider. Epidermal Cyst An epidermal cyst is sometimes called a sebaceous cyst, epidermal inclusion cyst, or infundibular cyst. These cysts usually contain a substance that looks "pasty" or "cheesy" and may have a bad smell. This substance is a protein called keratin. Epidermal cysts are usually found on the face, neck, or trunk. They may also occur in the vaginal area or other parts of the genitalia of both men and women. Epidermal cysts are usually small, painless, slow-growing bumps or lumps that move freely under the skin. It is important not to try to pop them. This may cause an infection and lead to tenderness and swelling. CAUSES    Epidermal cysts may be caused by a deep penetrating injury to the skin or a plugged hair follicle, often associated with acne. SYMPTOMS  Epidermal cysts can become inflamed and cause:  Redness.  Tenderness.  Increased temperature of the skin over the bumps or lumps.  Grayish-white, bad smelling material that drains from the bump or lump. DIAGNOSIS  Epidermal cysts are easily diagnosed by your caregiver during an exam. Rarely, a tissue sample (biopsy) may be taken to rule out other conditions that may resemble epidermal cysts. TREATMENT   Epidermal cysts often get better and disappear on their own. They are rarely ever cancerous.  If a cyst becomes infected, it may become inflamed and tender. This may require opening and draining the cyst. Treatment with antibiotics may be necessary. When the infection is gone, the cyst may be removed with minor surgery.  Small, inflamed cysts can often be treated with antibiotics or by injecting steroid medicines.  Sometimes, epidermal cysts become large and bothersome. If this happens, surgical removal in your caregiver's office may be necessary. HOME CARE INSTRUCTIONS  Only take over-the-counter or prescription medicines as directed by your caregiver.  Take your antibiotics as directed. Finish them even if you start to feel better. SEEK MEDICAL CARE IF:   Your cyst becomes tender, red, or swollen.  Your condition is not improving or is getting worse.  You have any other questions or concerns. MAKE SURE YOU:  Understand these instructions.  Will watch your condition.  Will get help right away if you are not doing well or get worse. Document Released: 08/06/2004 Document Revised: 11/28/2011 Document Reviewed: 03/14/2011 [facility]Exit[facility] Patient Information 2015 [place] StationExitCare, MarylandLLC. This information is not intended to replace advice given to you by your health care provider. Make sure you discuss any questions you have with your health care  provider.

## 2014-03-10 NOTE — Progress Notes (Signed)
Pre-visit discussion using our clinic review tool. No additional management support is needed unless otherwise documented below in the visit note.  

## 2014-03-10 NOTE — Progress Notes (Signed)
Subjective:    Patient ID: Abigail [name], female    DOB: 09/08/1946, 68 y.o.   MRN: [id number]  HPI  68 year old patient who has treated hypertension, who presents with a one-day history of a painful cyst involving the left inner gluteal buttock area.  She has had a fairly recent sebaceous cyst responded to antibiotic therapy alone without I&D.  There has been no drainage.  Past Medical History  Diagnosis Date  . Hyperlipidemia   . History of colonic polyps   . Late effect of adverse effect of drug, medicinal or biological substance   . History of tension headache   . Heart palpitations   . Diverticulitis 09-05-11    hx. gastritis, diverticulitis x2 -now surgery planned  . Patent foramen ovale     Small - unable to be closed -   . Hypertension 09-05-11    tx. Verapamil  . Hypothyroidism 09-05-11    Supplement used  . Degenerative joint disease of cervical spine 09-05-11    Cervival area, now some osteoarthritis-lower back and Rt. shoulder  . Osteoarthritis 112-17-12    spine and rt. hip, rt. shoulder  . Sleep apnea 09-05-11    no cpap ever, had surgery to remove cartilage, no problems now  . Stroke 09-05-11    2006/2009-(loss of memory, balance issues remains occ.)  . PONV (postoperative nausea and vomiting)   . Shortness of breath     occasionally  . Headache(784.0)   . Bruises easily   . DDD (degenerative disc disease)   . DIVERTICULITIS, ACUTE 02/10/2010    Qualifier: Diagnosis of  By: Amador CunasKwiatkowski  MD, Janett LabellaPeter F   . URI 09/02/2008    Qualifier: Diagnosis of  By: Amador CunasKwiatkowski  MD, Janett LabellaPeter F   . TIA 09/28/2007    Qualifier: Diagnosis of  By: Amador CunasKwiatkowski  MD, Janett LabellaPeter F   . SORE THROAT 04/30/2009    Qualifier: Diagnosis of  By: Amador CunasKwiatkowski  MD, Janett LabellaPeter F   . NECK PAIN, ACUTE 09/21/2010    Qualifier: Diagnosis of  By: Amador CunasKwiatkowski  MD, Antionette PolesPeter F   . GROIN PAIN 09/21/2010    Qualifier: Diagnosis of  By: Amador CunasKwiatkowski  MD, Janett LabellaPeter F   . CHEST PAIN UNSPECIFIED 05/20/2010    Annotation: Would like to  see Dr. Gala RomneyBensimhon. Qualifier: Diagnosis of  By: Carole BinningMeadows CMA AAMA, Debby      History   Social History  . Marital Status: Married    Spouse Name: N/A    Number of Children: N/A  . Years of Education: N/A   Occupational History  . Not on file.   Social History Main Topics  . Smoking status: Former Smoker -- 0.50 packs/day for 5 years    Types: Cigarettes    Quit date: 09/19/1968  . Smokeless tobacco: Never Used  . Alcohol Use: Yes     Comment:  -rare  . Drug Use: No  . Sexual Activity: Yes   Other Topics Concern  . Not on file   Social History Narrative  . No narrative on file    Past Surgical History  Procedure Laterality Date  . Abdominal hysterectomy    . Thyroidectomy, partial  09-05-11  . Elbow surgery  09-05-11    left elbow -ligament repair  . Umbilical hernia repair  yrs ago  . Colon resection  09/07/2011    Procedure: LAPAROSCOPIC SIGMOID COLON RESECTION;  Surgeon: Mariella SaaBenjamin T Hoxworth, MD;  Location: [facility];  Service: General;  Laterality: N/A;  with proctoscopy  .  Laparoscopy  09/07/2011    Procedure: LAPAROSCOPY DIAGNOSTIC;  Surgeon: Roselle LocusJames E Tomblin II;  Location: [facility];  Service: Gynecology;  Laterality: N/A;  . Salpingoophorectomy  09/07/2011    Procedure: SALPINGO OOPHERECTOMY;  Surgeon: Roselle LocusJames E Tomblin II;  Location: [facility];  Service: Gynecology;  Laterality: Right;  . Tubal ligation  1983  . Ventral hernia repair  06/20/2012    Procedure: LAPAROSCOPIC VENTRAL HERNIA;  Surgeon: Mariella SaaBenjamin T Hoxworth, MD;  Location: [facility];  Service: General;  Laterality: N/A;  Laparoscopic Repair of Ventral Hernia with mesh  . Ventral hernia repair N/A 12/13/2013    Procedure: LAPAROSCOPIC REPAIR RECURRENT VENTRAL INCISIONAL  HERNIA;  Surgeon: Mariella SaaBenjamin T Hoxworth, MD;  Location: [facility];  Service: General;  Laterality: N/A;  . Insertion of mesh N/A 12/13/2013    Procedure: INSERTION OF MESH;  Surgeon: Mariella SaaBenjamin T Hoxworth, MD;  Location: [facility];  Service: General;  Laterality:  N/A;    History reviewed. No pertinent family history.  Allergies  Allergen Reactions  . Aggrenox [Aspirin-Dipyridamole Er] Nausea And Vomiting and Other (See Comments)    Headache   . Dilaudid [Hydromorphone Hcl] Shortness Of Breath and Nausea And Vomiting    Able to take morphine without issue  . Shellfish Allergy Shortness Of Breath and Rash    Only shrimp allergy  . Codeine Phosphate Nausea And Vomiting    REACTION: unspecified  . Hydrocodone Nausea And Vomiting    Can take morphine without issue  . Meperidine Hcl Nausea And Vomiting  . Percocet [Oxycodone-Acetaminophen] Nausea And Vomiting    Can take morphine without issue  . Cephalexin Itching and Rash    Current Outpatient Prescriptions on File Prior to Visit  Medication Sig Dispense Refill  . atorvastatin (LIPITOR) 40 MG tablet Take 40 mg by mouth every evening.      . butalbital-acetaminophen-caffeine (FIORICET, ESGIC) 50-325-40 MG per tablet Take 2 tablets by mouth every 6 (six) hours as needed for headache.  120 tablet  2  . clopidogrel (PLAVIX) 75 MG tablet Take 75 mg by mouth every evening.      . diclofenac (VOLTAREN) 75 MG EC tablet TAKE 1 TABLET BY MOUTH TWICE A DAY  180 tablet  0  . levothyroxine (SYNTHROID, LEVOTHROID) 125 MCG tablet Take 125 mcg by mouth daily before breakfast.      . tetrahydrozoline 0.05 % ophthalmic solution Place 2 drops into both eyes as needed. Red eyes      . traMADol (ULTRAM) 50 MG tablet TAKE 1 TABLET BY MOUTH EVERY 6 HOURS AS NEEDED FOR PAIN  90 tablet  2  . verapamil (VERELAN PM) 360 MG 24 hr capsule Take 360 mg by mouth at bedtime.       No current facility-administered medications on file prior to visit.    BP 140/80  Pulse 61  Temp(Src) 98.4 F (36.9 C) (Oral)  Resp 20  Ht 5\' 5"  (1.651 m)  Wt 185 lb (83.915 kg)  BMI 30.79 kg/m2  SpO2 98%       Review of Systems  Constitutional: Negative for fever.  Skin: Positive for wound.       Objective:   Physical Exam    Skin:  2 cm, slightly erythematous, firm, nodular density in the left inner gluteal buttock area.  No fluctuance.          Assessment & Plan:   Inflamed sebaceous cyst.  Will treat with oral antibiotics, as well as sitz baths.  Patient is aware  this may require I&D in the future Hypertension stable

## 2014-04-13 ENCOUNTER — Other Ambulatory Visit: Payer: Self-pay | Admitting: Internal Medicine

## 2014-04-27 ENCOUNTER — Other Ambulatory Visit: Payer: Self-pay | Admitting: Internal Medicine

## 2014-05-13 ENCOUNTER — Other Ambulatory Visit: Payer: Self-pay | Admitting: Internal Medicine

## 2014-05-27 ENCOUNTER — Other Ambulatory Visit: Payer: Self-pay | Admitting: Internal Medicine

## 2014-05-30 ENCOUNTER — Other Ambulatory Visit: Payer: Self-pay | Admitting: *Deleted

## 2014-05-30 ENCOUNTER — Other Ambulatory Visit: Payer: Self-pay | Admitting: Internal Medicine

## 2014-05-30 MED ORDER — BUTALBITAL-APAP-CAFFEINE 50-325-40 MG PO TABS: ORAL_TABLET | ORAL | Status: DC

## 2014-06-10 ENCOUNTER — Other Ambulatory Visit: Payer: Self-pay | Admitting: Internal Medicine

## 2014-06-18 ENCOUNTER — Telehealth: Payer: Self-pay | Admitting: Internal Medicine

## 2014-06-18 MED ORDER — CLOPIDOGREL BISULFATE 75 MG PO TABS: 75.0000 mg | ORAL_TABLET | Freq: Every evening | ORAL | Status: DC

## 2014-06-18 NOTE — Telephone Encounter (Signed)
[id number] [place] [place] [address] [facility] [place] [facility] on clopidogrel (PLAVIX) 75 MG tablet

## 2014-06-18 NOTE — Telephone Encounter (Signed)
Rx sent 

## 2014-06-19 ENCOUNTER — Other Ambulatory Visit: Payer: Self-pay | Admitting: Internal Medicine

## 2014-07-08 ENCOUNTER — Other Ambulatory Visit: Payer: Self-pay | Admitting: Internal Medicine

## 2014-07-28 ENCOUNTER — Ambulatory Visit (INDEPENDENT_AMBULATORY_CARE_PROVIDER_SITE_OTHER): Payer: Medicare PPO | Admitting: Internal Medicine

## 2014-07-28 ENCOUNTER — Encounter: Payer: Self-pay | Admitting: Internal Medicine

## 2014-07-28 VITALS — BP 146/90 | HR 84 | Temp 100.9°F | Resp 20 | Ht 65.0 in | Wt 188.0 lb

## 2014-07-28 DIAGNOSIS — I10 Essential (primary) hypertension: Secondary | ICD-10-CM

## 2014-07-28 DIAGNOSIS — J209 Acute bronchitis, unspecified: Secondary | ICD-10-CM

## 2014-07-28 MED ORDER — BENZONATATE 200 MG PO CAPS: 200.0000 mg | ORAL_CAPSULE | Freq: Two times a day (BID) | ORAL | Status: DC | PRN

## 2014-07-28 MED ORDER — AZITHROMYCIN 250 MG PO TABS: ORAL_TABLET | ORAL | Status: DC

## 2014-07-28 NOTE — Progress Notes (Signed)
Subjective:    Patient ID: Abigail [name], female    DOB: 07/28/1946, 68 y.o.   MRN: [id number]  HPI 68 year old patient who has a history of treated hypertension.  She first developed an acute febrile illness approximately 2 weeks ago.  She seemed to improve but then 4 days ago had worsening symptoms with fever, chills, worsening cough, which has been intermittently productive.  Cough has been quite incessant.  She has a history of intolerance to narcotic medication.  Past Medical History  Diagnosis Date  . Hyperlipidemia   . History of colonic polyps   . Late effect of adverse effect of drug, medicinal or biological substance   . History of tension headache   . Heart palpitations   . Diverticulitis 09-05-11    hx. gastritis, diverticulitis x2 -now surgery planned  . Patent foramen ovale     Small - unable to be closed -   . Hypertension 09-05-11    tx. Verapamil  . Hypothyroidism 09-05-11    Supplement used  . Degenerative joint disease of cervical spine 09-05-11    Cervival area, now some osteoarthritis-lower back and Rt. shoulder  . Osteoarthritis 112-17-12    spine and rt. hip, rt. shoulder  . Sleep apnea 09-05-11    no cpap ever, had surgery to remove cartilage, no problems now  . Stroke 09-05-11    2006/2009-(loss of memory, balance issues remains occ.)  . PONV (postoperative nausea and vomiting)   . Shortness of breath     occasionally  . Headache(784.0)   . Bruises easily   . DDD (degenerative disc disease)   . DIVERTICULITIS, ACUTE 02/10/2010    Qualifier: Diagnosis of  By: Amador CunasKwiatkowski  MD, Janett LabellaPeter F   . URI 09/02/2008    Qualifier: Diagnosis of  By: Amador CunasKwiatkowski  MD, Janett LabellaPeter F   . TIA 09/28/2007    Qualifier: Diagnosis of  By: Amador CunasKwiatkowski  MD, Janett LabellaPeter F   . SORE THROAT 04/30/2009    Qualifier: Diagnosis of  By: Amador CunasKwiatkowski  MD, Janett LabellaPeter F   . NECK PAIN, ACUTE 09/21/2010    Qualifier: Diagnosis of  By: Amador CunasKwiatkowski  MD, Antionette PolesPeter F   . GROIN PAIN 09/21/2010    Qualifier: Diagnosis of   By: Amador CunasKwiatkowski  MD, Janett LabellaPeter F   . CHEST PAIN UNSPECIFIED 05/20/2010    Annotation: Would like to see Dr. Gala RomneyBensimhon. Qualifier: Diagnosis of  By: Carole BinningMeadows CMA AAMA, Debby      History   Social History  . Marital Status: Married    Spouse Name: N/A    Number of Children: N/A  . Years of Education: N/A   Occupational History  . Not on file.   Social History Main Topics  . Smoking status: Former Smoker -- 0.50 packs/day for 5 years    Types: Cigarettes    Quit date: 09/19/1968  . Smokeless tobacco: Never Used  . Alcohol Use: Yes     Comment:  -rare  . Drug Use: No  . Sexual Activity: Yes   Other Topics Concern  . Not on file   Social History Narrative    Past Surgical History  Procedure Laterality Date  . Abdominal hysterectomy    . Thyroidectomy, partial  09-05-11  . Elbow surgery  09-05-11    left elbow -ligament repair  . Umbilical hernia repair  yrs ago  . Colon resection  09/07/2011    Procedure: LAPAROSCOPIC SIGMOID COLON RESECTION;  Surgeon: Mariella SaaBenjamin T Hoxworth, MD;  Location: [facility];  Service: General;  Laterality: N/A;  with proctoscopy  . Laparoscopy  09/07/2011    Procedure: LAPAROSCOPY DIAGNOSTIC;  Surgeon: Roselle Locus II;  Location: [facility];  Service: Gynecology;  Laterality: N/A;  . Salpingoophorectomy  09/07/2011    Procedure: SALPINGO OOPHERECTOMY;  Surgeon: Roselle Locus II;  Location: [facility];  Service: Gynecology;  Laterality: Right;  . Tubal ligation  1983  . Ventral hernia repair  06/20/2012    Procedure: LAPAROSCOPIC VENTRAL HERNIA;  Surgeon: Mariella Saa, MD;  Location: [facility];  Service: General;  Laterality: N/A;  Laparoscopic Repair of Ventral Hernia with mesh  . Ventral hernia repair N/A 12/13/2013    Procedure: LAPAROSCOPIC REPAIR RECURRENT VENTRAL INCISIONAL  HERNIA;  Surgeon: Mariella Saa, MD;  Location: [facility];  Service: General;  Laterality: N/A;  . Insertion of mesh N/A 12/13/2013    Procedure: INSERTION OF MESH;  Surgeon:  Mariella Saa, MD;  Location: [facility];  Service: General;  Laterality: N/A;    No family history on file.  Allergies  Allergen Reactions  . Aggrenox [Aspirin-Dipyridamole Er] Nausea And Vomiting and Other (See Comments)    Headache   . Dilaudid [Hydromorphone Hcl] Shortness Of Breath and Nausea And Vomiting    Able to take morphine without issue  . Shellfish Allergy Shortness Of Breath and Rash    Only shrimp allergy  . Codeine Phosphate Nausea And Vomiting    REACTION: unspecified  . Hydrocodone Nausea And Vomiting    Can take morphine without issue  . Meperidine Hcl Nausea And Vomiting  . Percocet [Oxycodone-Acetaminophen] Nausea And Vomiting    Can take morphine without issue  . Cephalexin Itching and Rash    Current Outpatient Prescriptions on File Prior to Visit  Medication Sig Dispense Refill  . atorvastatin (LIPITOR) 40 MG tablet TAKE 1 TABLET BY MOUTH EVERY DAY 90 tablet 1  . butalbital-acetaminophen-caffeine (FIORICET, ESGIC) 50-325-40 MG per tablet TAKE 1 TO 2 TABLETS BY MOUTH EVERY 6 HOURS AS NEEDED 120 tablet 1  . clopidogrel (PLAVIX) 75 MG tablet Take 1 tablet (75 mg total) by mouth every evening. 90 tablet 1  . diclofenac (VOLTAREN) 75 MG EC tablet TAKE 1 TABLET BY MOUTH TWICE A DAY 180 tablet 0  . levothyroxine (SYNTHROID, LEVOTHROID) 125 MCG tablet TAKE 1 TABLET BY MOUTH DAILY BEFORE BREAKFAST. 90 tablet 1  . tetrahydrozoline 0.05 % ophthalmic solution Place 2 drops into both eyes as needed. Red eyes    . traMADol (ULTRAM) 50 MG tablet TAKE 1 TABLET BY MOUTH EVERY 6 HOURS AS NEEDED FOR PAIN 90 tablet 2  . verapamil (VERELAN PM) 360 MG 24 hr capsule TAKE 1 CAPSULE BY MOUTH AT BEDTIME. 90 capsule 1   No current facility-administered medications on file prior to visit.    BP 146/90 mmHg  Pulse 84  Temp(Src) 100.9 F (38.3 C) (Oral)  Resp 20  Ht 5\' 5"  (1.651 m)  Wt 188 lb (85.276 kg)  BMI 31.28 kg/m2  SpO2 98%      Review of Systems    Constitutional: Positive for fever, chills, activity change, appetite change and fatigue.  HENT: Positive for congestion. Negative for dental problem, hearing loss, rhinorrhea, sinus pressure, sore throat and tinnitus.   Eyes: Negative for pain, discharge and visual disturbance.  Respiratory: Positive for cough. Negative for shortness of breath.   Cardiovascular: Negative for chest pain, palpitations and leg swelling.  Gastrointestinal: Negative for nausea, vomiting, abdominal pain, diarrhea, constipation, blood in stool  and abdominal distention.  Genitourinary: Negative for dysuria, urgency, frequency, hematuria, flank pain, vaginal bleeding, vaginal discharge, difficulty urinating, vaginal pain and pelvic pain.  Musculoskeletal: Negative for joint swelling, arthralgias and gait problem.  Skin: Negative for rash.  Neurological: Negative for dizziness, syncope, speech difficulty, weakness, numbness and headaches.  Hematological: Negative for adenopathy.  Psychiatric/Behavioral: Negative for behavioral problems, dysphoric mood and agitation. The patient is not nervous/anxious.        Objective:   Physical Exam  Constitutional: She is oriented to person, place, and time. She appears well-developed and well-nourished.  HENT:  Head: Normocephalic.  Right Ear: External ear normal.  Left Ear: External ear normal.  Mouth/Throat: Oropharynx is clear and moist.  Eyes: Conjunctivae and EOM are normal. Pupils are equal, round, and reactive to light.  Neck: Normal range of motion. Neck supple. No thyromegaly present.  Cardiovascular: Normal rate, regular rhythm, normal heart sounds and intact distal pulses.   Pulmonary/Chest: Effort normal.  Scattered coarse rhonchi on the left O2 saturation 98% Frequent paroxysms of coughing  Abdominal: Soft. Bowel sounds are normal. She exhibits no mass. There is no tenderness.  Musculoskeletal: Normal range of motion.  Lymphadenopathy:    She has no  cervical adenopathy.  Neurological: She is alert and oriented to person, place, and time.  Skin: Skin is warm and dry. No rash noted.  Psychiatric: She has a normal mood and affect. Her behavior is normal.          Assessment & Plan:   Bronchitis.  Possible left-sided CAP.  We'll treat with expectorants antitussives and azithromycin Hypertension, stable

## 2014-07-28 NOTE — Patient Instructions (Addendum)
Take over-the-counter expectorants and cough medications such as  Mucinex DM.  Call if there is no improvement in 5 to 7 days or if  you develop worsening cough, fever, or new symptoms, such as shortness of breath or chest pain.  Take your antibiotic as prescribed until ALL of it is gone, but stop if you develop a rash, swelling, or any side effects of the medication.  Contact our office as soon as possible if  there are side effects of the medication. Acute Bronchitis Bronchitis is inflammation of the airways that extend from the windpipe into the lungs (bronchi). The inflammation often causes mucus to develop. This leads to a cough, which is the most common symptom of bronchitis.  In acute bronchitis, the condition usually develops suddenly and goes away over time, usually in a couple weeks. Smoking, allergies, and asthma can make bronchitis worse. Repeated episodes of bronchitis may cause further lung problems.  CAUSES Acute bronchitis is most often caused by the same virus that causes a cold. The virus can spread from person to person (contagious) through coughing, sneezing, and touching contaminated objects. SIGNS AND SYMPTOMS   Cough.   Fever.   Coughing up mucus.   Body aches.   Chest congestion.   Chills.   Shortness of breath.   Sore throat.  DIAGNOSIS  Acute bronchitis is usually diagnosed through a physical exam. Your health care provider will also ask you questions about your medical history. Tests, such as chest X-rays, are sometimes done to rule out other conditions.  TREATMENT  Acute bronchitis usually goes away in a couple weeks. Oftentimes, no medical treatment is necessary. Medicines are sometimes given for relief of fever or cough. Antibiotic medicines are usually not needed but may be prescribed in certain situations. In some cases, an inhaler may be recommended to help reduce shortness of breath and control the cough. A cool mist vaporizer may also be used to  help thin bronchial secretions and make it easier to clear the chest.  HOME CARE INSTRUCTIONS  Get plenty of rest.   Drink enough fluids to keep your urine clear or pale yellow (unless you have a medical condition that requires fluid restriction). Increasing fluids may help thin your respiratory secretions (sputum) and reduce chest congestion, and it will prevent dehydration.   Take medicines only as directed by your health care provider.  If you were prescribed an antibiotic medicine, finish it all even if you start to feel better.  Avoid smoking and secondhand smoke. Exposure to cigarette smoke or irritating chemicals will make bronchitis worse. If you are a smoker, consider using nicotine gum or skin patches to help control withdrawal symptoms. Quitting smoking will help your lungs heal faster.   Reduce the chances of another bout of acute bronchitis by washing your hands frequently, avoiding people with cold symptoms, and trying not to touch your hands to your mouth, nose, or eyes.   Keep all follow-up visits as directed by your health care provider.  SEEK MEDICAL CARE IF: Your symptoms do not improve after 1 week of treatment.  SEEK IMMEDIATE MEDICAL CARE IF:  You develop an increased fever or chills.   You have chest pain.   You have severe shortness of breath.  You have bloody sputum.   You develop dehydration.  You faint or repeatedly feel like you are going to pass out.  You develop repeated vomiting.  You develop a severe headache. MAKE SURE YOU:   Understand these instructions.  Will  watch your condition.  Will get help right away if you are not doing well or get worse. Document Released: 10/13/2004 Document Revised: 01/20/2014 Document Reviewed: 02/26/2013 [facility] Patient Information 2015 [employer] [place]. This information is not intended to replace advice given to you by your health care provider. Make sure you discuss any questions you have with your  health care provider.

## 2014-07-28 NOTE — Progress Notes (Signed)
Pre visit review using our clinic review tool, if applicable. No additional management support is needed unless otherwise documented below in the visit note. 

## 2014-08-07 ENCOUNTER — Other Ambulatory Visit: Payer: Self-pay | Admitting: Internal Medicine

## 2014-08-27 ENCOUNTER — Other Ambulatory Visit: Payer: Self-pay | Admitting: Gynecology

## 2014-08-27 DIAGNOSIS — N6310 Unspecified lump in the right breast, unspecified quadrant: Secondary | ICD-10-CM

## 2014-09-04 ENCOUNTER — Ambulatory Visit (INDEPENDENT_AMBULATORY_CARE_PROVIDER_SITE_OTHER): Payer: Medicare PPO | Admitting: Family Medicine

## 2014-09-04 ENCOUNTER — Encounter: Payer: Self-pay | Admitting: Family Medicine

## 2014-09-04 VITALS — BP 130/80 | Temp 98.6°F | Wt 185.0 lb

## 2014-09-04 DIAGNOSIS — W57XXXA Bitten or stung by nonvenomous insect and other nonvenomous arthropods, initial encounter: Secondary | ICD-10-CM

## 2014-09-04 DIAGNOSIS — S50861A Insect bite (nonvenomous) of right forearm, initial encounter: Secondary | ICD-10-CM | POA: Insufficient documentation

## 2014-09-04 NOTE — Progress Notes (Signed)
Pre visit review using our clinic review tool, if applicable. No additional management support is needed unless otherwise documented below in the visit note. 

## 2014-09-04 NOTE — Progress Notes (Signed)
   Subjective:    Patient ID: Abigail [name], female    DOB: 05/03/1946, 68 y.o.   MRN: [id number]  HPI Abigail [name] is a 68 year old female who comes in today for evaluation of a tick bite  She states she had a bite on her right arm about 2 weeks ago on her husband got the tick off but apparently a piece of the tick remained in her arm. She's been afebrile and feels well.    Review of Systems Review of systems otherwise negative    Objective:   Physical Exam  Well-developed well-nourished female no acute distress vital signs stable she's afebrile examination skin shows a partially retained piece of the tick although its well demarcated and it really walled itself off.  I explained we had 2 options one was to leave alone since its been 2 weeks and she is asymptomatic the second would be the numbers up and go ahead and excise the oral area. She is content to leave alone      Assessment & Plan:  Tick bite........ no fever.......Marland Kitchen. return when necessary

## 2014-09-04 NOTE — Patient Instructions (Signed)
Return when necessary 

## 2014-09-09 ENCOUNTER — Ambulatory Visit
Admission: RE | Admit: 2014-09-09 | Discharge: 2014-09-09 | Disposition: A | Discharge status: Home / Self Care | Payer: Medicare PPO | Source: Ambulatory Visit | Attending: Gynecology | Admitting: Gynecology

## 2014-09-09 DIAGNOSIS — N6310 Unspecified lump in the right breast, unspecified quadrant: Secondary | ICD-10-CM

## 2014-09-22 ENCOUNTER — Other Ambulatory Visit: Payer: Self-pay | Admitting: Internal Medicine

## 2014-10-08 ENCOUNTER — Other Ambulatory Visit: Payer: Self-pay | Admitting: Internal Medicine

## 2014-10-20 ENCOUNTER — Encounter: Payer: Self-pay | Admitting: *Deleted

## 2014-10-20 ENCOUNTER — Encounter: Payer: Self-pay | Admitting: Internal Medicine

## 2014-10-20 ENCOUNTER — Ambulatory Visit (INDEPENDENT_AMBULATORY_CARE_PROVIDER_SITE_OTHER): Payer: Medicare PPO | Admitting: Internal Medicine

## 2014-10-20 VITALS — BP 130/86 | HR 67 | Temp 98.1°F | Resp 20 | Ht 65.0 in | Wt 185.0 lb

## 2014-10-20 DIAGNOSIS — I10 Essential (primary) hypertension: Secondary | ICD-10-CM

## 2014-10-20 DIAGNOSIS — K5732 Diverticulitis of large intestine without perforation or abscess without bleeding: Secondary | ICD-10-CM

## 2014-10-20 MED ORDER — CIPROFLOXACIN HCL 500 MG PO TABS: 500.0000 mg | ORAL_TABLET | Freq: Two times a day (BID) | ORAL | Status: DC

## 2014-10-20 MED ORDER — METRONIDAZOLE 500 MG PO TABS: 500.0000 mg | ORAL_TABLET | Freq: Three times a day (TID) | ORAL | Status: DC

## 2014-10-20 NOTE — Progress Notes (Signed)
Subjective:    Patient ID: Abigail [name], female    DOB: 1945/11/06, 69 y.o.   MRN: [id number]  HPI 69 year old patient who has a history of diverticulitis.  She is status post sigmoid colectomy for chronic recurrent diverticulitis.  For the past several days she's had increasing mid and lower abdominal pain.  She has had some associated diarrhea but no nausea, vomiting or fever.  Past Medical History  Diagnosis Date  . Hyperlipidemia   . History of colonic polyps   . Late effect of adverse effect of drug, medicinal or biological substance   . History of tension headache   . Heart palpitations   . Diverticulitis 09-05-11    hx. gastritis, diverticulitis x2 -now surgery planned  . Patent foramen ovale     Small - unable to be closed -   . Hypertension 09-05-11    tx. Verapamil  . Hypothyroidism 09-05-11    Supplement used  . Degenerative joint disease of cervical spine 09-05-11    Cervival area, now some osteoarthritis-lower back and Rt. shoulder  . Osteoarthritis 112-17-12    spine and rt. hip, rt. shoulder  . Sleep apnea 09-05-11    no cpap ever, had surgery to remove cartilage, no problems now  . Stroke 09-05-11    2006/2009-(loss of memory, balance issues remains occ.)  . PONV (postoperative nausea and vomiting)   . Shortness of breath     occasionally  . Headache(784.0)   . Bruises easily   . DDD (degenerative disc disease)   . DIVERTICULITIS, ACUTE 02/10/2010    Qualifier: Diagnosis of  By: Amador Cunas  MD, Janett Labella   . URI 09/02/2008    Qualifier: Diagnosis of  By: Amador Cunas  MD, Janett Labella   . TIA 09/28/2007    Qualifier: Diagnosis of  By: Amador Cunas  MD, Janett Labella   . SORE THROAT 04/30/2009    Qualifier: Diagnosis of  By: Amador Cunas  MD, Janett Labella   . NECK PAIN, ACUTE 09/21/2010    Qualifier: Diagnosis of  By: Amador Cunas  MD, Antionette Poles PAIN 09/21/2010    Qualifier: Diagnosis of  By: Amador Cunas  MD, Janett Labella   . CHEST PAIN UNSPECIFIED 05/20/2010    Annotation: Would  like to see Dr. Gala Romney. Qualifier: Diagnosis of  By: Carole Binning CMA AAMA, Debby      History   Social History  . Marital Status: Married    Spouse Name: N/A    Number of Children: N/A  . Years of Education: N/A   Occupational History  . Not on file.   Social History Main Topics  . Smoking status: Former Smoker -- 0.50 packs/day for 5 years    Types: Cigarettes    Quit date: 09/19/1968  . Smokeless tobacco: Never Used  . Alcohol Use: Yes     Comment:  -rare  . Drug Use: No  . Sexual Activity: Yes   Other Topics Concern  . Not on file   Social History Narrative    Past Surgical History  Procedure Laterality Date  . Abdominal hysterectomy    . Thyroidectomy, partial  09-05-11  . Elbow surgery  09-05-11    left elbow -ligament repair  . Umbilical hernia repair  yrs ago  . Colon resection  09/07/2011    Procedure: LAPAROSCOPIC SIGMOID COLON RESECTION;  Surgeon: Mariella Saa, MD;  Location: [facility];  Service: General;  Laterality: N/A;  with proctoscopy  . Laparoscopy  09/07/2011  Procedure: LAPAROSCOPY DIAGNOSTIC;  Surgeon: Roselle Locus II;  Location: [facility];  Service: Gynecology;  Laterality: N/A;  . Salpingoophorectomy  09/07/2011    Procedure: SALPINGO OOPHERECTOMY;  Surgeon: Roselle Locus II;  Location: [facility];  Service: Gynecology;  Laterality: Right;  . Tubal ligation  1983  . Ventral hernia repair  06/20/2012    Procedure: LAPAROSCOPIC VENTRAL HERNIA;  Surgeon: Mariella Saa, MD;  Location: [facility];  Service: General;  Laterality: N/A;  Laparoscopic Repair of Ventral Hernia with mesh  . Ventral hernia repair N/A 12/13/2013    Procedure: LAPAROSCOPIC REPAIR RECURRENT VENTRAL INCISIONAL  HERNIA;  Surgeon: Mariella Saa, MD;  Location: [facility];  Service: General;  Laterality: N/A;  . Insertion of mesh N/A 12/13/2013    Procedure: INSERTION OF MESH;  Surgeon: Mariella Saa, MD;  Location: [facility];  Service: General;  Laterality: N/A;    No  family history on file.  Allergies  Allergen Reactions  . Aggrenox [Aspirin-Dipyridamole Er] Nausea And Vomiting and Other (See Comments)    Headache   . Dilaudid [Hydromorphone Hcl] Shortness Of Breath and Nausea And Vomiting    Able to take morphine without issue  . Shellfish Allergy Shortness Of Breath and Rash    Only shrimp allergy  . Codeine Phosphate Nausea And Vomiting    REACTION: unspecified  . Hydrocodone Nausea And Vomiting    Can take morphine without issue  . Meperidine Hcl Nausea And Vomiting  . Percocet [Oxycodone-Acetaminophen] Nausea And Vomiting    Can take morphine without issue  . Cephalexin Itching and Rash    Current Outpatient Prescriptions on File Prior to Visit  Medication Sig Dispense Refill  . atorvastatin (LIPITOR) 40 MG tablet TAKE 1 TABLET BY MOUTH EVERY DAY 90 tablet 1  . butalbital-acetaminophen-caffeine (FIORICET, ESGIC) 50-325-40 MG per tablet TAKE 1 TO 2 TABLETS BY MOUTH EVERY 6 HOURS AS NEEDED 120 tablet 1  . clopidogrel (PLAVIX) 75 MG tablet Take 1 tablet (75 mg total) by mouth every evening. 90 tablet 1  . diclofenac (VOLTAREN) 75 MG EC tablet TAKE 1 TABLET BY MOUTH TWICE A DAY 180 tablet 0  . levothyroxine (SYNTHROID, LEVOTHROID) 125 MCG tablet TAKE 1 TABLET BY MOUTH DAILY BEFORE BREAKFAST. 90 tablet 1  . tetrahydrozoline 0.05 % ophthalmic solution Place 2 drops into both eyes as needed. Red eyes    . traMADol (ULTRAM) 50 MG tablet TAKE 1 TABLET BY MOUTH EVERY 6 HOURS AS NEEDED FOR PAIN 90 tablet 1  . verapamil (VERELAN PM) 360 MG 24 hr capsule TAKE 1 CAPSULE BY MOUTH AT BEDTIME. 90 capsule 1   No current facility-administered medications on file prior to visit.    BP 130/86 mmHg  Pulse 67  Temp(Src) 98.1 F (36.7 C) (Oral)  Resp 20  Ht  (1.651 m)  Wt 185 lb (83.915 kg)  BMI 30.79 kg/m2  SpO2 97%      Review of Systems  Constitutional: Negative.   HENT: Negative for congestion, dental problem, hearing loss, rhinorrhea,  sinus pressure, sore throat and tinnitus.   Eyes: Negative for pain, discharge and visual disturbance.  Respiratory: Negative for cough and shortness of breath.   Cardiovascular: Negative for chest pain, palpitations and leg swelling.  Gastrointestinal: Positive for abdominal pain and diarrhea. Negative for nausea, vomiting, constipation, blood in stool and abdominal distention.  Genitourinary: Negative for dysuria, urgency, frequency, hematuria, flank pain, vaginal bleeding, vaginal discharge, difficulty urinating, vaginal pain and pelvic pain.  Musculoskeletal: Negative for joint swelling, arthralgias and gait problem.  Skin: Negative for rash.  Neurological: Negative for dizziness, syncope, speech difficulty, weakness, numbness and headaches.  Hematological: Negative for adenopathy.  Psychiatric/Behavioral: Negative for behavioral problems, dysphoric mood and agitation. The patient is not nervous/anxious.        Objective:   Physical Exam  Constitutional: She appears well-developed and well-nourished. No distress.  Cardiovascular: Regular rhythm.   Heart rate normal without tachycardia  Pulmonary/Chest: Effort normal and breath sounds normal.  Abdominal: Soft. Bowel sounds are normal. She exhibits no distension and no mass. There is tenderness. There is rebound. There is no guarding.  Abdomen is soft, flat, and nondistended. Scattered bowel sounds were noted No voluntary guarding Rebound tenderness present          Assessment & Plan:   Recurrent acute diverticulitis.  Patient will be treated with the dual antibiotic therapy, analgesics and low residue diet.  She will call if she develops fever, worsening pain or does not respond promptly to medication

## 2014-10-20 NOTE — Patient Instructions (Signed)

## 2014-10-20 NOTE — Progress Notes (Signed)
Pre visit review using our clinic review tool, if applicable. No additional management support is needed unless otherwise documented below in the visit note. 

## 2014-10-21 ENCOUNTER — Telehealth: Payer: Self-pay | Admitting: Internal Medicine

## 2014-10-21 NOTE — Telephone Encounter (Signed)
emmi emailed °

## 2014-10-22 ENCOUNTER — Telehealth: Payer: Self-pay | Admitting: Internal Medicine

## 2014-10-22 NOTE — Telephone Encounter (Signed)
done

## 2014-10-22 NOTE — Telephone Encounter (Signed)
Patient states [facility] office [facility] from her 10/20/14 appointment.  (f) [phone]

## 2014-10-22 NOTE — Telephone Encounter (Signed)
Archie Pattenonya, please fax notes from last office visit with Dr. Kirtland BouchardK. Thanks

## 2014-10-27 ENCOUNTER — Telehealth: Payer: Self-pay | Admitting: Internal Medicine

## 2014-10-27 NOTE — Telephone Encounter (Signed)
[facility] for butalb-acetamin-caff.  PA was already on file and good till 09/19/15.  Patient was notified.

## 2014-10-30 ENCOUNTER — Telehealth: Payer: Self-pay | Admitting: Internal Medicine

## 2014-10-30 NOTE — Telephone Encounter (Signed)
Left detailed message need to be seen tomorrow,  call office tomorrow morning to schedule appointment per Dr.K.

## 2014-10-30 NOTE — Telephone Encounter (Signed)
Please see message and advise 

## 2014-10-30 NOTE — Telephone Encounter (Signed)
ROV tomarrow

## 2014-10-30 NOTE — Telephone Encounter (Signed)
Pt seen Mon w/ diverticulitis, an states she is still having systems.pt is having diarrhea, and stomach is sore. Pt finished all antibiotics on Mon. pls advise. [facility]

## 2014-10-31 NOTE — Telephone Encounter (Signed)
Pt states she is much better and would prefer to wait for an appt. The bloating is gone and diarrhea and states she does not feel as though she needs to come in anymore.  Advised pt  to cb if there are any changes.  Pt verbalized understanding,.

## 2014-10-31 NOTE — Telephone Encounter (Signed)
Olegario MessierKathy, please try to get pt in today.

## 2014-10-31 NOTE — Telephone Encounter (Signed)
FYI

## 2014-11-05 ENCOUNTER — Other Ambulatory Visit: Payer: Self-pay | Admitting: Internal Medicine

## 2014-11-12 ENCOUNTER — Other Ambulatory Visit: Payer: Self-pay | Admitting: Internal Medicine

## 2014-11-13 ENCOUNTER — Other Ambulatory Visit: Payer: Self-pay | Admitting: Internal Medicine

## 2014-12-09 ENCOUNTER — Other Ambulatory Visit: Payer: Self-pay | Admitting: Gynecology

## 2014-12-10 LAB — CYTOLOGY - PAP

## 2014-12-13 ENCOUNTER — Other Ambulatory Visit: Payer: Self-pay | Admitting: Internal Medicine

## 2015-01-07 ENCOUNTER — Other Ambulatory Visit: Payer: Self-pay | Admitting: Internal Medicine

## 2015-02-09 ENCOUNTER — Ambulatory Visit (INDEPENDENT_AMBULATORY_CARE_PROVIDER_SITE_OTHER): Payer: Medicare PPO | Admitting: Internal Medicine

## 2015-02-09 ENCOUNTER — Encounter: Payer: Self-pay | Admitting: Internal Medicine

## 2015-02-09 VITALS — BP 150/90 | HR 100 | Temp 100.9°F | Resp 20 | Ht 65.0 in | Wt 198.0 lb

## 2015-02-09 DIAGNOSIS — R5081 Fever presenting with conditions classified elsewhere: Secondary | ICD-10-CM

## 2015-02-09 DIAGNOSIS — R05 Cough: Secondary | ICD-10-CM

## 2015-02-09 DIAGNOSIS — I10 Essential (primary) hypertension: Secondary | ICD-10-CM | POA: Diagnosis not present

## 2015-02-09 DIAGNOSIS — R059 Cough, unspecified: Secondary | ICD-10-CM

## 2015-02-09 MED ORDER — AZITHROMYCIN 250 MG PO TABS: ORAL_TABLET | ORAL | Status: DC

## 2015-02-09 NOTE — Progress Notes (Signed)
Subjective:    Patient ID: Abigail [name], female    DOB: 10/06/1945, 69 y.o.   MRN: [id number]  HPI  69 year old patient who presents with a three-day history of headache, sinus congestion, nausea, left-sided chest pain and mild productive cough, headaches or associated with the dental pain.  She has had high fever and today is 100 point 9.  Past Medical History  Diagnosis Date  . Hyperlipidemia   . History of colonic polyps   . Late effect of adverse effect of drug, medicinal or biological substance   . History of tension headache   . Heart palpitations   . Diverticulitis 09-05-11    hx. gastritis, diverticulitis x2 -now surgery planned  . Patent foramen ovale     Small - unable to be closed -   . Hypertension 09-05-11    tx. Verapamil  . Hypothyroidism 09-05-11    Supplement used  . Degenerative joint disease of cervical spine 09-05-11    Cervival area, now some osteoarthritis-lower back and Rt. shoulder  . Osteoarthritis 112-17-12    spine and rt. hip, rt. shoulder  . Sleep apnea 09-05-11    no cpap ever, had surgery to remove cartilage, no problems now  . Stroke 09-05-11    2006/2009-(loss of memory, balance issues remains occ.)  . PONV (postoperative nausea and vomiting)   . Shortness of breath     occasionally  . Headache(784.0)   . Bruises easily   . DDD (degenerative disc disease)   . DIVERTICULITIS, ACUTE 02/10/2010    Qualifier: Diagnosis of  By: Amador CunasKwiatkowski  MD, Janett LabellaPeter F   . URI 09/02/2008    Qualifier: Diagnosis of  By: Amador CunasKwiatkowski  MD, Janett LabellaPeter F   . TIA 09/28/2007    Qualifier: Diagnosis of  By: Amador CunasKwiatkowski  MD, Janett LabellaPeter F   . SORE THROAT 04/30/2009    Qualifier: Diagnosis of  By: Amador CunasKwiatkowski  MD, Janett LabellaPeter F   . NECK PAIN, ACUTE 09/21/2010    Qualifier: Diagnosis of  By: Amador CunasKwiatkowski  MD, Antionette PolesPeter F   . GROIN PAIN 09/21/2010    Qualifier: Diagnosis of  By: Amador CunasKwiatkowski  MD, Janett LabellaPeter F   . CHEST PAIN UNSPECIFIED 05/20/2010    Annotation: Would like to see Dr. Gala RomneyBensimhon. Qualifier:  Diagnosis of  By: Carole BinningMeadows CMA AAMA, Debby      History   Social History  . Marital Status: Married    Spouse Name: N/A  . Number of Children: N/A  . Years of Education: N/A   Occupational History  . Not on file.   Social History Main Topics  . Smoking status: Former Smoker -- 0.50 packs/day for 5 years    Types: Cigarettes    Quit date: 09/19/1968  . Smokeless tobacco: Never Used  . Alcohol Use: Yes     Comment:  -rare  . Drug Use: No  . Sexual Activity: Yes   Other Topics Concern  . Not on file   Social History Narrative    Past Surgical History  Procedure Laterality Date  . Abdominal hysterectomy    . Thyroidectomy, partial  09-05-11  . Elbow surgery  09-05-11    left elbow -ligament repair  . Umbilical hernia repair  yrs ago  . Colon resection  09/07/2011    Procedure: LAPAROSCOPIC SIGMOID COLON RESECTION;  Surgeon: Mariella SaaBenjamin T Hoxworth, MD;  Location: [facility];  Service: General;  Laterality: N/A;  with proctoscopy  . Laparoscopy  09/07/2011    Procedure: LAPAROSCOPY DIAGNOSTIC;  Surgeon:  Roselle Locus II;  Location: [facility];  Service: Gynecology;  Laterality: N/A;  . Salpingoophorectomy  09/07/2011    Procedure: SALPINGO OOPHERECTOMY;  Surgeon: Roselle Locus II;  Location: [facility];  Service: Gynecology;  Laterality: Right;  . Tubal ligation  1983  . Ventral hernia repair  06/20/2012    Procedure: LAPAROSCOPIC VENTRAL HERNIA;  Surgeon: Mariella Saa, MD;  Location: [facility];  Service: General;  Laterality: N/A;  Laparoscopic Repair of Ventral Hernia with mesh  . Ventral hernia repair N/A 12/13/2013    Procedure: LAPAROSCOPIC REPAIR RECURRENT VENTRAL INCISIONAL  HERNIA;  Surgeon: Mariella Saa, MD;  Location: [facility];  Service: General;  Laterality: N/A;  . Insertion of mesh N/A 12/13/2013    Procedure: INSERTION OF MESH;  Surgeon: Mariella Saa, MD;  Location: [facility];  Service: General;  Laterality: N/A;    No family history on file.  Allergies    Allergen Reactions  . Aggrenox [Aspirin-Dipyridamole Er] Nausea And Vomiting and Other (See Comments)    Headache   . Dilaudid [Hydromorphone Hcl] Shortness Of Breath and Nausea And Vomiting    Able to take morphine without issue  . Shellfish Allergy Shortness Of Breath and Rash    Only shrimp allergy  . Codeine Phosphate Nausea And Vomiting    REACTION: unspecified  . Hydrocodone Nausea And Vomiting    Can take morphine without issue  . Meperidine Hcl Nausea And Vomiting  . Percocet [Oxycodone-Acetaminophen] Nausea And Vomiting    Can take morphine without issue  . Cephalexin Itching and Rash    Current Outpatient Prescriptions on File Prior to Visit  Medication Sig Dispense Refill  . atorvastatin (LIPITOR) 40 MG tablet TAKE 1 TABLET BY MOUTH EVERY DAY 90 tablet 1  . butalbital-acetaminophen-caffeine (FIORICET, ESGIC) 50-325-40 MG per tablet TAKE 1 TO 2 TABLETS BY MOUTH EVERY 6 HOURS AS NEEDED FOR HEADACHES 120 tablet 1  . clopidogrel (PLAVIX) 75 MG tablet Take 1 tablet (75 mg total) by mouth every evening. 90 tablet 1  . diclofenac (VOLTAREN) 75 MG EC tablet TAKE 1 TABLET BY MOUTH TWICE A DAY 180 tablet 1  . levothyroxine (SYNTHROID, LEVOTHROID) 125 MCG tablet TAKE 1 TABLET BY MOUTH DAILY BEFORE BREAKFAST. 90 tablet 0  . tetrahydrozoline 0.05 % ophthalmic solution Place 2 drops into both eyes as needed. Red eyes    . traMADol (ULTRAM) 50 MG tablet TAKE 1 TABLET BY MOUTH EVERY 6 HOURS AS NEEDED FOR PAIN 90 tablet 1  . verapamil (VERELAN PM) 360 MG 24 hr capsule TAKE 1 CAPSULE BY MOUTH AT BEDTIME. 90 capsule 1   No current facility-administered medications on file prior to visit.    BP 150/90 mmHg  Pulse 100  Temp(Src) 100.9 F (38.3 C) (Oral)  Resp 20  Ht  (1.651 m)  Wt 198 lb (89.812 kg)  BMI 32.95 kg/m2  SpO2 97%     Review of Systems  Constitutional: Positive for fever, activity change, appetite change and fatigue. Negative for chills and diaphoresis.  HENT:  Positive for congestion, postnasal drip, rhinorrhea, sinus pressure and sore throat. Negative for dental problem, hearing loss and tinnitus.   Eyes: Negative for pain, discharge and visual disturbance.  Respiratory: Positive for cough. Negative for shortness of breath.   Cardiovascular: Positive for chest pain. Negative for palpitations and leg swelling.  Gastrointestinal: Negative for nausea, vomiting, abdominal pain, diarrhea, constipation, blood in stool and abdominal distention.  Genitourinary: Negative for dysuria, urgency, frequency, hematuria,  flank pain, vaginal bleeding, vaginal discharge, difficulty urinating, vaginal pain and pelvic pain.  Musculoskeletal: Negative for joint swelling, arthralgias and gait problem.  Skin: Negative for rash.  Neurological: Negative for dizziness, syncope, speech difficulty, weakness, numbness and headaches.  Hematological: Negative for adenopathy.  Psychiatric/Behavioral: Negative for behavioral problems, dysphoric mood and agitation. The patient is not nervous/anxious.        Objective:   Physical Exam  Constitutional: She is oriented to person, place, and time. She appears well-developed and well-nourished.  Appears unwell, with temperature of 100 point 9 O2 saturation 97  HENT:  Head: Normocephalic.  Right Ear: External ear normal.  Left Ear: External ear normal.  Mouth/Throat: Oropharynx is clear and moist.  Eyes: Conjunctivae and EOM are normal. Pupils are equal, round, and reactive to light.  Neck: Normal range of motion. Neck supple. No thyromegaly present.  Cardiovascular: Normal rate, regular rhythm, normal heart sounds and intact distal pulses.   Pulmonary/Chest: Effort normal.  Bilateral coarse rhonchi  Abdominal: Soft. Bowel sounds are normal. She exhibits no mass. There is no tenderness.  Musculoskeletal: Normal range of motion.  Lymphadenopathy:    She has no cervical adenopathy.  Neurological: She is alert and oriented to  person, place, and time.  Skin: Skin is warm and dry. No rash noted.  Psychiatric: She has a normal mood and affect. Her behavior is normal.          Assessment & Plan:   URI.  Rule out community acquired pneumonia.  Will check a chest x-ray will treat with expectorants And Tylenol for fever control Hypertension

## 2015-02-09 NOTE — Progress Notes (Signed)
Pre visit review using our clinic review tool, if applicable. No additional management support is needed unless otherwise documented below in the visit note. 

## 2015-02-09 NOTE — Patient Instructions (Addendum)
HOME CARE INSTRUCTIONS  Drink plenty of water. Water helps thin the mucus so your sinuses can drain more easily.  Use a humidifier.  Inhale steam 3 to 4 times a day (for example, sit in the bathroom with the shower running).  Apply a warm, moist washcloth to your face 3 to 4 times a day, or as directed by your health care provider.  Use saline nasal sprays to help moisten and clean your sinuses.   Get plenty of rest. Drink enough fluids to keep your urine clear or pale yellow (unless you have a medical condition that requires fluid restriction). Increasing fluids may help thin your respiratory secretions (sputum) and reduce chest congestion, and it will prevent dehydration. Take medicines only as directed by your health care provider. If you were prescribed an antibiotic medicine, finish it all even if you start to feel better.   Chest x-ray in the morning as discussed  Report any new or worsening symptoms  Take [phone] mg of Tylenol every 6 hours as needed for pain relief or fever.  Avoid taking more than 3000 mg in a 24-hour period (  This may cause liver damage).

## 2015-02-10 ENCOUNTER — Ambulatory Visit (INDEPENDENT_AMBULATORY_CARE_PROVIDER_SITE_OTHER)
Admission: RE | Admit: 2015-02-10 | Discharge: 2015-02-10 | Disposition: A | Discharge status: Home / Self Care | Payer: Medicare PPO | Source: Ambulatory Visit | Attending: Internal Medicine | Admitting: Internal Medicine

## 2015-02-10 DIAGNOSIS — R05 Cough: Secondary | ICD-10-CM | POA: Diagnosis not present

## 2015-02-10 DIAGNOSIS — R5081 Fever presenting with conditions classified elsewhere: Secondary | ICD-10-CM | POA: Diagnosis not present

## 2015-02-10 DIAGNOSIS — R059 Cough, unspecified: Secondary | ICD-10-CM

## 2015-02-11 ENCOUNTER — Other Ambulatory Visit: Payer: Self-pay | Admitting: Internal Medicine

## 2015-03-10 ENCOUNTER — Telehealth: Payer: Self-pay | Admitting: *Deleted

## 2015-03-10 NOTE — Telephone Encounter (Signed)
Pt request rx for athletes feet.  I reviewed pt's records and she has not been seen in this office in over 2 years.  I informed pt and transferred her to schedulers.

## 2015-03-13 ENCOUNTER — Telehealth: Payer: Self-pay | Admitting: Internal Medicine

## 2015-03-13 NOTE — Telephone Encounter (Signed)
Spoke to pt, she is concerned about her son and GI issues. Told her to have her son call me and I will get him on the schedule for next week. Dewey verbalized understanding.

## 2015-03-13 NOTE — Telephone Encounter (Signed)
Pt called for an appt to  discuss chest xray results that has already been discussed with her.  I spoke with Dr Kirtland Bouchard to see if she needed an appt or just a call back  And he said a call back.

## 2015-03-18 ENCOUNTER — Telehealth: Payer: Self-pay | Admitting: Cardiology

## 2015-03-18 NOTE — Telephone Encounter (Signed)
PAOV Abigail [name]  

## 2015-03-18 NOTE — Telephone Encounter (Signed)
Request sent to scheduling to set up visit for next available extender.

## 2015-03-18 NOTE — Telephone Encounter (Signed)
Pt c/o of Chest Pain: STAT if CP now or developed within 24 hours  1. Are you having CP right now? Yes   2. Are you experiencing any other symptoms (ex. SOB, nausea, vomiting, sweating)? SOB   3. How long have you been experiencing CP? They have become more frequent in the last month   4. Is your CP continuous or coming and going? Coming and going   5. Have you taken Nitroglycerin? Did not say  ?

## 2015-03-18 NOTE — Telephone Encounter (Signed)
New Message   Pt called to make appt but pt stated that she has been experiencing SOB  Transferred to [place].

## 2015-03-18 NOTE — Telephone Encounter (Signed)
[facility]  Has been having ongoing chest pain and shortness of breath X several months. Last seen in 2013 w/ instruction to f/u as needed.  States since May, chest pain has become "a little more frequent". Pain under left breast. Sometimes dull, sometimes sharp/throbbing. No increase in severity. She gets relief w/ tramadol which she has used long-term PRN for pain.  States shortness of breath "daily, but not all the time".  Would like to f/u - Dr. Ludwig Clarksrenshaw's next available or w/ recommended provider. Did not want to wait till August (next schedule openings)  Will route for advice - [place] office visit OK?

## 2015-03-19 ENCOUNTER — Telehealth: Payer: Self-pay | Admitting: Cardiology

## 2015-03-19 NOTE — Telephone Encounter (Signed)
Closed encounter °

## 2015-03-29 ENCOUNTER — Other Ambulatory Visit: Payer: Self-pay | Admitting: Internal Medicine

## 2015-04-07 ENCOUNTER — Telehealth: Payer: Self-pay | Admitting: Internal Medicine

## 2015-04-07 DIAGNOSIS — R51 Headache: Principal | ICD-10-CM

## 2015-04-07 DIAGNOSIS — R519 Headache, unspecified: Secondary | ICD-10-CM

## 2015-04-07 NOTE — Telephone Encounter (Signed)
Spoke to pt, told her order for referral to Neurologist was done and someone will contact her regarding an appointment. Pt verbalized understanding.

## 2015-04-07 NOTE — Telephone Encounter (Signed)
Okay to send referral for Neuro?

## 2015-04-07 NOTE — Telephone Encounter (Signed)
ok 

## 2015-04-07 NOTE — Telephone Encounter (Signed)
Pt call to ask for a referral to see Dr Pearlean BrownieSethi her neurologist. She said she has been having sharpe pain in her head and some disoriented

## 2015-04-09 ENCOUNTER — Other Ambulatory Visit: Payer: Self-pay | Admitting: Internal Medicine

## 2015-04-17 ENCOUNTER — Telehealth: Payer: Self-pay | Admitting: Internal Medicine

## 2015-04-17 NOTE — Telephone Encounter (Signed)
Ask patient to call her neurologist office and have her placed on a cancellation list for hopefully an earlier appointment

## 2015-04-17 NOTE — Telephone Encounter (Signed)
Abigail [name] that is can't see her neuro until Sept

## 2015-04-17 NOTE — Telephone Encounter (Signed)
Please see message and advise 

## 2015-04-17 NOTE — Telephone Encounter (Signed)
Left detailed message on personal voicemail, Dr.K said for you to call your Neurologist and ask them to put you on cancellation list and hopefully they can see you sooner. Any questions please call the office.

## 2015-04-17 NOTE — Telephone Encounter (Signed)
Pt call to say she can see her neuro doctor till Sept and is asking if Dr Kirtland Bouchard knows any of the other doctors in that practice he can recommend

## 2015-04-22 ENCOUNTER — Other Ambulatory Visit: Payer: Self-pay | Admitting: Internal Medicine

## 2015-04-24 ENCOUNTER — Encounter: Payer: Self-pay | Admitting: Internal Medicine

## 2015-04-24 ENCOUNTER — Ambulatory Visit (INDEPENDENT_AMBULATORY_CARE_PROVIDER_SITE_OTHER): Payer: Medicare PPO | Admitting: Internal Medicine

## 2015-04-24 VITALS — BP 120/80 | HR 56 | Temp 99.3°F | Wt 188.0 lb

## 2015-04-24 DIAGNOSIS — I10 Essential (primary) hypertension: Secondary | ICD-10-CM | POA: Diagnosis not present

## 2015-04-24 DIAGNOSIS — M549 Dorsalgia, unspecified: Secondary | ICD-10-CM | POA: Diagnosis not present

## 2015-04-24 DIAGNOSIS — R2681 Unsteadiness on feet: Secondary | ICD-10-CM | POA: Diagnosis not present

## 2015-04-24 MED ORDER — METHYLPREDNISOLONE ACETATE 80 MG/ML IJ SUSP
80.0000 mg | Freq: Once | INTRAMUSCULAR | Status: AC
  Administered 2015-04-24: 80 mg via INTRAMUSCULAR

## 2015-04-24 NOTE — Progress Notes (Signed)
Pre visit review using our clinic review tool, if applicable. No additional management support is needed unless otherwise documented below in the visit note. 

## 2015-04-24 NOTE — Patient Instructions (Addendum)
Most patients with low back pain will improve with time over the next two to 6 weeks.  Keep active but avoid any activities that cause pain.  Apply moist heat to the low back area several times daily.  Low Back Sprain with Rehab  A sprain is an injury in which a ligament is torn. The ligaments of the lower back are vulnerable to sprains. However, they are strong and require great force to be injured. These ligaments are important for stabilizing the spinal column. Sprains are classified into three categories. Grade 1 sprains cause pain, but the tendon is not lengthened. Grade 2 sprains include a lengthened ligament, due to the ligament being stretched or partially ruptured. With grade 2 sprains there is still function, although the function may be decreased. Grade 3 sprains involve a complete tear of the tendon or muscle, and function is usually impaired. SYMPTOMS   Severe pain in the lower back.  Sometimes, a feeling of a "pop," "snap," or tear, at the time of injury.  Tenderness and sometimes swelling at the injury site.  Uncommonly, bruising (contusion) within 48 hours of injury.  Muscle spasms in the back. CAUSES  Low back sprains occur when a force is placed on the ligaments that is greater than they can handle. Common causes of injury include:  Performing a stressful act while off-balance.  Repetitive stressful activities that involve movement of the lower back.  Direct hit (trauma) to the lower back. RISK INCREASES WITH:  Contact sports (football, wrestling).  Collisions (major skiing accidents).  Sports that require throwing or lifting (baseball, weightlifting).  Sports involving twisting of the spine (gymnastics, diving, tennis, golf).  Poor strength and flexibility.  Inadequate protection.  Previous back injury or surgery (especially fusion). PREVENTION  Wear properly fitted and padded protective equipment.  Warm up and stretch properly before activity.  Allow  for adequate recovery between workouts.  Maintain physical fitness:  Strength, flexibility, and endurance.  Cardiovascular fitness.  Maintain a healthy body weight. PROGNOSIS  If treated properly, low back sprains usually heal with non-surgical treatment. The length of time for healing depends on the severity of the injury.  RELATED COMPLICATIONS   Recurring symptoms, resulting in a chronic problem.  Chronic inflammation and pain in the low back.  Delayed healing or resolution of symptoms, especially if activity is resumed too soon.  Prolonged impairment.  Unstable or arthritic joints of the low back. TREATMENT  Treatment first involves the use of ice and medicine, to reduce pain and inflammation. The use of strengthening and stretching exercises may help reduce pain with activity. These exercises may be performed at home or with a therapist. Severe injuries may require referral to a therapist for further evaluation and treatment, such as ultrasound. Your caregiver may advise that you wear a back brace or corset, to help reduce pain and discomfort. Often, prolonged bed rest results in greater harm then benefit. Corticosteroid injections may be recommended. However, these should be reserved for the most serious cases. It is important to avoid using your back when lifting objects. At night, sleep on your back on a firm mattress, with a pillow placed under your knees. If non-surgical treatment is unsuccessful, surgery may be needed.  MEDICATION   If pain medicine is needed, nonsteroidal anti-inflammatory medicines (aspirin and ibuprofen), or other minor pain relievers (acetaminophen), are often advised.  Do not take pain medicine for 7 days before surgery.  Prescription pain relievers may be given, if your caregiver thinks they are  needed. Use only as directed and only as much as you need.  Ointments applied to the skin may be helpful.  Corticosteroid injections may be given by your  caregiver. These injections should be reserved for the most serious cases, because they may only be given a certain number of times. HEAT AND COLD  Cold treatment (icing) should be applied for 10 to 15 minutes every 2 to 3 hours for inflammation and pain, and immediately after activity that aggravates your symptoms. Use ice packs or an ice massage.  Heat treatment may be used before performing stretching and strengthening activities prescribed by your caregiver, physical therapist, or athletic trainer. Use a heat pack or a warm water soak. SEEK MEDICAL CARE IF:   Symptoms get worse or do not improve in 2 to 4 weeks, despite treatment.  You develop numbness or weakness in either leg.  You lose bowel or bladder function.  Any of the following occur after surgery: fever, increased pain, swelling, redness, drainage of fluids, or bleeding in the affected area.  New, unexplained symptoms develop. (Drugs used in treatment may produce side effects.) EXERCISES  RANGE OF MOTION (ROM) AND STRETCHING EXERCISES - Low Back Sprain Most people with lower back pain will find that their symptoms get worse with excessive bending forward (flexion) or arching at the lower back (extension). The exercises that will help resolve your symptoms will focus on the opposite motion.  Your physician, physical therapist or athletic trainer will help you determine which exercises will be most helpful to resolve your lower back pain. Do not complete any exercises without first consulting with your caregiver. Discontinue any exercises which make your symptoms worse, until you speak to your caregiver. If you have pain, numbness or tingling which travels down into your buttocks, leg or foot, the goal of the therapy is for these symptoms to move closer to your back and eventually resolve. Sometimes, these leg symptoms will get better, but your lower back pain may worsen. This is often an indication of progress in your  rehabilitation. Be very alert to any changes in your symptoms and the activities in which you participated in the 24 hours prior to the change. Sharing this information with your caregiver will allow him or her to most efficiently treat your condition. These exercises may help you when beginning to rehabilitate your injury. Your symptoms may resolve with or without further involvement from your physician, physical therapist or athletic trainer. While completing these exercises, remember:   Restoring tissue flexibility helps normal motion to return to the joints. This allows healthier, less painful movement and activity.  An effective stretch should be held for at least 30 seconds.  A stretch should never be painful. You should only feel a gentle lengthening or release in the stretched tissue. FLEXION RANGE OF MOTION AND STRETCHING EXERCISES: STRETCH - Flexion, Single Knee to Chest   Lie on a firm bed or floor with both legs extended in front of you.  Keeping one leg in contact with the floor, bring your opposite knee to your chest. Hold your leg in place by either grabbing behind your thigh or at your knee.  Pull until you feel a gentle stretch in your low back. Hold __________ seconds.  Slowly release your grasp and repeat the exercise with the opposite side. Repeat __________ times. Complete this exercise __________ times per day.  STRETCH - Flexion, Double Knee to Chest  Lie on a firm bed or floor with both legs extended in  front of you.  Keeping one leg in contact with the floor, bring your opposite knee to your chest.  Tense your stomach muscles to support your back and then lift your other knee to your chest. Hold your legs in place by either grabbing behind your thighs or at your knees.  Pull both knees toward your chest until you feel a gentle stretch in your low back. Hold __________ seconds.  Tense your stomach muscles and slowly return one leg at a time to the floor. Repeat  __________ times. Complete this exercise __________ times per day.  STRETCH - Low Trunk Rotation  Lie on a firm bed or floor. Keeping your legs in front of you, bend your knees so they are both pointed toward the ceiling and your feet are flat on the floor.  Extend your arms out to the side. This will stabilize your upper body by keeping your shoulders in contact with the floor.  Gently and slowly drop both knees together to one side until you feel a gentle stretch in your low back. Hold for __________ seconds.  Tense your stomach muscles to support your lower back as you bring your knees back to the starting position. Repeat the exercise to the other side. Repeat __________ times. Complete this exercise __________ times per day  EXTENSION RANGE OF MOTION AND FLEXIBILITY EXERCISES: STRETCH - Extension, Prone on Elbows   Lie on your stomach on the floor, a bed will be too soft. Place your palms about shoulder width apart and at the height of your head.  Place your elbows under your shoulders. If this is too painful, stack pillows under your chest.  Allow your body to relax so that your hips drop lower and make contact more completely with the floor.  Hold this position for __________ seconds.  Slowly return to lying flat on the floor. Repeat __________ times. Complete this exercise __________ times per day.  RANGE OF MOTION - Extension, Prone Press Ups  Lie on your stomach on the floor, a bed will be too soft. Place your palms about shoulder width apart and at the height of your head.  Keeping your back as relaxed as possible, slowly straighten your elbows while keeping your hips on the floor. You may adjust the placement of your hands to maximize your comfort. As you gain motion, your hands will come more underneath your shoulders.  Hold this position __________ seconds.  Slowly return to lying flat on the floor. Repeat __________ times. Complete this exercise __________ times per  day.  RANGE OF MOTION- Quadruped, Neutral Spine   Assume a hands and knees position on a firm surface. Keep your hands under your shoulders and your knees under your hips. You may place padding under your knees for comfort.  Drop your head and point your tailbone toward the ground below you. This will round out your lower back like an angry cat. Hold this position for __________ seconds.  Slowly lift your head and release your tail bone so that your back sags into a large arch, like an old horse.  Hold this position for __________ seconds.  Repeat this until you feel limber in your low back.  Now, find your "sweet spot." This will be the most comfortable position somewhere between the two previous positions. This is your neutral spine. Once you have found this position, tense your stomach muscles to support your low back.  Hold this position for __________ seconds. Repeat __________ times. Complete this exercise __________  times per day.  STRENGTHENING EXERCISES - Low Back Sprain These exercises may help you when beginning to rehabilitate your injury. These exercises should be done near your "sweet spot." This is the neutral, low-back arch, somewhere between fully rounded and fully arched, that is your least painful position. When performed in this safe range of motion, these exercises can be used for people who have either a flexion or extension based injury. These exercises may resolve your symptoms with or without further involvement from your physician, physical therapist or athletic trainer. While completing these exercises, remember:   Muscles can gain both the endurance and the strength needed for everyday activities through controlled exercises.  Complete these exercises as instructed by your physician, physical therapist or athletic trainer. Increase the resistance and repetitions only as guided.  You may experience muscle soreness or fatigue, but the pain or discomfort you are  trying to eliminate should never worsen during these exercises. If this pain does worsen, stop and make certain you are following the directions exactly. If the pain is still present after adjustments, discontinue the exercise until you can discuss the trouble with your caregiver. STRENGTHENING - Deep Abdominals, Pelvic Tilt   Lie on a firm bed or floor. Keeping your legs in front of you, bend your knees so they are both pointed toward the ceiling and your feet are flat on the floor.  Tense your lower abdominal muscles to press your low back into the floor. This motion will rotate your pelvis so that your tail bone is scooping upwards rather than pointing at your feet or into the floor. With a gentle tension and even breathing, hold this position for __________ seconds. Repeat __________ times. Complete this exercise __________ times per day.  STRENGTHENING - Abdominals, Crunches   Lie on a firm bed or floor. Keeping your legs in front of you, bend your knees so they are both pointed toward the ceiling and your feet are flat on the floor. Cross your arms over your chest.  Slightly tip your chin down without bending your neck.  Tense your abdominals and slowly lift your trunk high enough to just clear your shoulder blades. Lifting higher can put excessive stress on the lower back and does not further strengthen your abdominal muscles.  Control your return to the starting position. Repeat __________ times. Complete this exercise __________ times per day.  STRENGTHENING - Quadruped, Opposite UE/LE Lift   Assume a hands and knees position on a firm surface. Keep your hands under your shoulders and your knees under your hips. You may place padding under your knees for comfort.  Find your neutral spine and gently tense your abdominal muscles so that you can maintain this position. Your shoulders and hips should form a rectangle that is parallel with the floor and is not twisted.  Keeping your trunk  steady, lift your right hand no higher than your shoulder and then your left leg no higher than your hip. Make sure you are not holding your breath. Hold this position for __________ seconds.  Continuing to keep your abdominal muscles tense and your back steady, slowly return to your starting position. Repeat with the opposite arm and leg. Repeat __________ times. Complete this exercise __________ times per day.  STRENGTHENING - Abdominals and Quadriceps, Straight Leg Raise   Lie on a firm bed or floor with both legs extended in front of you.  Keeping one leg in contact with the floor, bend the other knee so that your  foot can rest flat on the floor.  Find your neutral spine, and tense your abdominal muscles to maintain your spinal position throughout the exercise.  Slowly lift your straight leg off the floor about 6 inches for a count of 15, making sure to not hold your breath.  Still keeping your neutral spine, slowly lower your leg all the way to the floor. Repeat this exercise with each leg __________ times. Complete this exercise __________ times per day. POSTURE AND BODY MECHANICS CONSIDERATIONS - Low Back Sprain Keeping correct posture when sitting, standing or completing your activities will reduce the stress put on different body tissues, allowing injured tissues a chance to heal and limiting painful experiences. The following are general guidelines for improved posture. Your physician or physical therapist will provide you with any instructions specific to your needs. While reading these guidelines, remember:  The exercises prescribed by your provider will help you have the flexibility and strength to maintain correct postures.  The correct posture provides the best environment for your joints to work. All of your joints have less wear and tear when properly supported by a spine with good posture. This means you will experience a healthier, less painful body.  Correct posture must be  practiced with all of your activities, especially prolonged sitting and standing. Correct posture is as important when doing repetitive low-stress activities (typing) as it is when doing a single heavy-load activity (lifting). RESTING POSITIONS Consider which positions are most painful for you when choosing a resting position. If you have pain with flexion-based activities (sitting, bending, stooping, squatting), choose a position that allows you to rest in a less flexed posture. You would want to avoid curling into a fetal position on your side. If your pain worsens with extension-based activities (prolonged standing, working overhead), avoid resting in an extended position such as sleeping on your stomach. Most people will find more comfort when they rest with their spine in a more neutral position, neither too rounded nor too arched. Lying on a non-sagging bed on your side with a pillow between your knees, or on your back with a pillow under your knees will often provide some relief. Keep in mind, being in any one position for a prolonged period of time, no matter how correct your posture, can still lead to stiffness. PROPER SITTING POSTURE In order to minimize stress and discomfort on your spine, you must sit with correct posture. Sitting with good posture should be effortless for a healthy body. Returning to good posture is a gradual process. Many people can work toward this most comfortably by using various supports until they have the flexibility and strength to maintain this posture on their own. When sitting with proper posture, your ears will fall over your shoulders and your shoulders will fall over your hips. You should use the back of the chair to support your upper back. Your lower back will be in a neutral position, just slightly arched. You may place a small pillow or folded towel at the base of your lower back for  support.  When working at a desk, create an environment that supports good,  upright posture. Without extra support, muscles tire, which leads to excessive strain on joints and other tissues. Keep these recommendations in mind: CHAIR:  A chair should be able to slide under your desk when your back makes contact with the back of the chair. This allows you to work closely.  The chair's height should allow your eyes to be level  with the upper part of your monitor and your hands to be slightly lower than your elbows. BODY POSITION  Your feet should make contact with the floor. If this is not possible, use a foot rest.  Keep your ears over your shoulders. This will reduce stress on your neck and low back. INCORRECT SITTING POSTURES  If you are feeling tired and unable to assume a healthy sitting posture, do not slouch or slump. This puts excessive strain on your back tissues, causing more damage and pain. Healthier options include:  Using more support, like a lumbar pillow.  Switching tasks to something that requires you to be upright or walking.  Talking a brief walk.  Lying down to rest in a neutral-spine position. PROLONGED STANDING WHILE SLIGHTLY LEANING FORWARD  When completing a task that requires you to lean forward while standing in one place for a long time, place either foot up on a stationary 2-4 inch high object to help maintain the best posture. When both feet are on the ground, the lower back tends to lose its slight inward curve. If this curve flattens (or becomes too large), then the back and your other joints will experience too much stress, tire more quickly, and can cause pain. CORRECT STANDING POSTURES Proper standing posture should be assumed with all daily activities, even if they only take a few moments, like when brushing your teeth. As in sitting, your ears should fall over your shoulders and your shoulders should fall over your hips. You should keep a slight tension in your abdominal muscles to brace your spine. Your tailbone should point down to  the ground, not behind your body, resulting in an over-extended swayback posture.  INCORRECT STANDING POSTURES  Common incorrect standing postures include a forward head, locked knees and/or an excessive swayback. WALKING Walk with an upright posture. Your ears, shoulders and hips should all line-up. PROLONGED ACTIVITY IN A FLEXED POSITION When completing a task that requires you to bend forward at your waist or lean over a low surface, try to find a way to stabilize 3 out of 4 of your limbs. You can place a hand or elbow on your thigh or rest a knee on the surface you are reaching across. This will provide you more stability, so that your muscles do not tire as quickly. By keeping your knees relaxed, or slightly bent, you will also reduce stress across your lower back. CORRECT LIFTING TECHNIQUES DO :  Assume a wide stance. This will provide you more stability and the opportunity to get as close as possible to the object which you are lifting.  Tense your abdominals to brace your spine. Bend at the knees and hips. Keeping your back locked in a neutral-spine position, lift using your leg muscles. Lift with your legs, keeping your back straight.  Test the weight of unknown objects before attempting to lift them.  Try to keep your elbows locked down at your sides in order get the best strength from your shoulders when carrying an object.  Always ask for help when lifting heavy or awkward objects. INCORRECT LIFTING TECHNIQUES DO NOT:   Lock your knees when lifting, even if it is a small object.  Bend and twist. Pivot at your feet or move your feet when needing to change directions.  Assume that you can safely pick up even a paperclip without proper posture. Document Released: 09/05/2005 Document Revised: 11/28/2011 Document Reviewed: 12/18/2008 [facility] Patient Information 2015 [place], [place]. This information is not intended  to replace advice given to you by your health care provider. Make  sure you discuss any questions you have with your health care provider.  Neurology follow-up as scheduled  MRI as discussed

## 2015-04-24 NOTE — Progress Notes (Signed)
Subjective:    Patient ID: Abigail [name], female    DOB: 1946/02/17, 69 y.o.   MRN: [id number]  HPI  69 year old patient who presents with a chief complaint of left lumbar back pain.  She has had a long history of intermittent low back pain over the years.  Her analgesics have not been very effective.  Pain is worsened over the past week. She has a history of cerebral vascular disease.  MRI in 2009 revealed progressive small vessel disease changes compared to a prior scan in 2007.  She has had a prior left medullary stroke.  Progressive changes raise the possibility of demyelinating disease.  She is complaining of some worsening gait instability but has had no falls.  She is scheduled for neurology follow-up next month  Past Medical History  Diagnosis Date  . Hyperlipidemia   . History of colonic polyps   . Late effect of adverse effect of drug, medicinal or biological substance   . History of tension headache   . Heart palpitations   . Diverticulitis 09-05-11    hx. gastritis, diverticulitis x2 -now surgery planned  . Patent foramen ovale     Small - unable to be closed -   . Hypertension 09-05-11    tx. Verapamil  . Hypothyroidism 09-05-11    Supplement used  . Degenerative joint disease of cervical spine 09-05-11    Cervival area, now some osteoarthritis-lower back and Rt. shoulder  . Osteoarthritis 112-17-12    spine and rt. hip, rt. shoulder  . Sleep apnea 09-05-11    no cpap ever, had surgery to remove cartilage, no problems now  . Stroke 09-05-11    2006/2009-(loss of memory, balance issues remains occ.)  . PONV (postoperative nausea and vomiting)   . Shortness of breath     occasionally  . Headache(784.0)   . Bruises easily   . DDD (degenerative disc disease)   . DIVERTICULITIS, ACUTE 02/10/2010    Qualifier: Diagnosis of  By: Amador Cunas  MD, Janett Labella   . URI 09/02/2008    Qualifier: Diagnosis of  By: Amador Cunas  MD, Janett Labella   . TIA 09/28/2007    Qualifier: Diagnosis  of  By: Amador Cunas  MD, Janett Labella   . SORE THROAT 04/30/2009    Qualifier: Diagnosis of  By: Amador Cunas  MD, Janett Labella   . NECK PAIN, ACUTE 09/21/2010    Qualifier: Diagnosis of  By: Amador Cunas  MD, Antionette Poles PAIN 09/21/2010    Qualifier: Diagnosis of  By: Amador Cunas  MD, Janett Labella   . CHEST PAIN UNSPECIFIED 05/20/2010    Annotation: Would like to see Dr. Gala Romney. Qualifier: Diagnosis of  By: Carole Binning CMA AAMA, Debby      History   Social History  . Marital Status: Married    Spouse Name: N/A  . Number of Children: N/A  . Years of Education: N/A   Occupational History  . Not on file.   Social History Main Topics  . Smoking status: Former Smoker -- 0.50 packs/day for 5 years    Types: Cigarettes    Quit date: 09/19/1968  . Smokeless tobacco: Never Used  . Alcohol Use: Yes     Comment:  -rare  . Drug Use: No  . Sexual Activity: Yes   Other Topics Concern  . Not on file   Social History Narrative    Past Surgical History  Procedure Laterality Date  . Abdominal hysterectomy    .  Thyroidectomy, partial  09-05-11  . Elbow surgery  09-05-11    left elbow -ligament repair  . Umbilical hernia repair  yrs ago  . Colon resection  09/07/2011    Procedure: LAPAROSCOPIC SIGMOID COLON RESECTION;  Surgeon: Mariella Saa, MD;  Location: [facility];  Service: General;  Laterality: N/A;  with proctoscopy  . Laparoscopy  09/07/2011    Procedure: LAPAROSCOPY DIAGNOSTIC;  Surgeon: Roselle Locus II;  Location: [facility];  Service: Gynecology;  Laterality: N/A;  . Salpingoophorectomy  09/07/2011    Procedure: SALPINGO OOPHERECTOMY;  Surgeon: Roselle Locus II;  Location: [facility];  Service: Gynecology;  Laterality: Right;  . Tubal ligation  1983  . Ventral hernia repair  06/20/2012    Procedure: LAPAROSCOPIC VENTRAL HERNIA;  Surgeon: Mariella Saa, MD;  Location: [facility];  Service: General;  Laterality: N/A;  Laparoscopic Repair of Ventral Hernia with mesh  . Ventral hernia repair  N/A 12/13/2013    Procedure: LAPAROSCOPIC REPAIR RECURRENT VENTRAL INCISIONAL  HERNIA;  Surgeon: Mariella Saa, MD;  Location: [facility];  Service: General;  Laterality: N/A;  . Insertion of mesh N/A 12/13/2013    Procedure: INSERTION OF MESH;  Surgeon: Mariella Saa, MD;  Location: [facility];  Service: General;  Laterality: N/A;    No family history on file.  Allergies  Allergen Reactions  . Aggrenox [Aspirin-Dipyridamole Er] Nausea And Vomiting and Other (See Comments)    Headache   . Dilaudid [Hydromorphone Hcl] Shortness Of Breath and Nausea And Vomiting    Able to take morphine without issue  . Shellfish Allergy Shortness Of Breath and Rash    Only shrimp allergy  . Codeine Phosphate Nausea And Vomiting    REACTION: unspecified  . Hydrocodone Nausea And Vomiting    Can take morphine without issue  . Meperidine Hcl Nausea And Vomiting  . Percocet [Oxycodone-Acetaminophen] Nausea And Vomiting    Can take morphine without issue  . Cephalexin Itching and Rash    Current Outpatient Prescriptions on File Prior to Visit  Medication Sig Dispense Refill  . atorvastatin (LIPITOR) 40 MG tablet TAKE 1 TABLET BY MOUTH EVERY DAY 90 tablet 1  . butalbital-acetaminophen-caffeine (FIORICET, ESGIC) 50-325-40 MG per tablet TAKE 1 TO 2 TABLETS BY MOUTH EVERY 6 HOURS AS NEEDED FOR HEADACHE 120 tablet 1  . clopidogrel (PLAVIX) 75 MG tablet Take 1 tablet (75 mg total) by mouth every evening. 90 tablet 1  . diclofenac (VOLTAREN) 75 MG EC tablet TAKE 1 TABLET BY MOUTH TWICE A DAY 180 tablet 1  . levothyroxine (SYNTHROID, LEVOTHROID) 125 MCG tablet TAKE 1 TABLET BY MOUTH DAILY BEFORE BREAKFAST. 90 tablet 3  . tetrahydrozoline 0.05 % ophthalmic solution Place 2 drops into both eyes as needed. Red eyes    . traMADol (ULTRAM) 50 MG tablet TAKE 1 TABLET BY MOUTH EVERY 6 HOURS AS NEEDED FOR PAIN 90 tablet 2  . verapamil (VERELAN PM) 360 MG 24 hr capsule TAKE 1 CAPSULE BY MOUTH AT BEDTIME. 90 capsule 1     No current facility-administered medications on file prior to visit.    BP 120/80 mmHg  Pulse 56  Temp(Src) 99.3 F (37.4 C) (Oral)  Wt 188 lb (85.276 kg)     Review of Systems  Constitutional: Negative.   HENT: Negative for congestion, dental problem, hearing loss, rhinorrhea, sinus pressure, sore throat and tinnitus.   Eyes: Negative for pain, discharge and visual disturbance.  Respiratory: Negative for cough and shortness of breath.  Cardiovascular: Negative for chest pain, palpitations and leg swelling.  Gastrointestinal: Negative for nausea, vomiting, abdominal pain, diarrhea, constipation, blood in stool and abdominal distention.  Genitourinary: Negative for dysuria, urgency, frequency, hematuria, flank pain, vaginal bleeding, vaginal discharge, difficulty urinating, vaginal pain and pelvic pain.  Musculoskeletal: Positive for back pain and gait problem. Negative for joint swelling and arthralgias.  Skin: Negative for rash.  Neurological: Negative for dizziness, syncope, speech difficulty, weakness, numbness and headaches.  Hematological: Negative for adenopathy.  Psychiatric/Behavioral: Negative for behavioral problems, dysphoric mood and agitation. The patient is not nervous/anxious.        Objective:   Physical Exam  Constitutional: She is oriented to person, place, and time. She appears well-developed and well-nourished. She appears distressed.  Musculoskeletal:  Negative straight leg test Full range of motion of the hips Normal flexion and extension of feet Reflexes symmetrical, although blunted  Neurological: She is alert and oriented to person, place, and time.  Slightly impaired.  Finger to nose testing Heel to shin testing appeared fairly normal Gait appeared normal          Assessment & Plan:   Cerebrovascular disease.  History of worsening gait abnormality.  Will follow-up a MRI in view of her history of small vessel disease and concern for  demyelination.  Neurology follow-up as scheduled.  We'll continue antiplatelet medication and aggressive risk factor modification with good blood pressure control and statin therapy  Left lumbar pain.  Acute on chronic low back pain.  Will treat with Depo-Medrol.  Was given exercises for general stretching and conditioning.  Will call if unimproved.  Will continue analgesics  Hypertension, stable

## 2015-05-02 ENCOUNTER — Ambulatory Visit
Admission: RE | Admit: 2015-05-02 | Discharge: 2015-05-02 | Disposition: A | Discharge status: Home / Self Care | Payer: Medicare PPO | Source: Ambulatory Visit | Attending: Internal Medicine | Admitting: Internal Medicine

## 2015-05-02 DIAGNOSIS — R2681 Unsteadiness on feet: Secondary | ICD-10-CM

## 2015-05-02 DIAGNOSIS — I639 Cerebral infarction, unspecified: Secondary | ICD-10-CM | POA: Diagnosis not present

## 2015-05-04 NOTE — Progress Notes (Signed)
HPI: FU palpitations and evaluate CP. She has a long history of palpitations and presyncope. Patient apparently noted to have a PFO on transesophageal echocardiogram in 2006 following CVA. Cardiac catheterization performed in September of 2011 showed a normal left main, minimal irregularities in the mid LAD, normal circumflex and normal right coronary artery. Ejection fraction was 55%. No evidence of abdominal aortic aneurysm. Echocardiogram in September 2011 showed normal LV function, grade 1 diastolic dysfunction, mildly dilated descending aorta and trace aortic insufficiency. Event monitor in 2011 for palpitations showed sinus with PACs. Pt not seen since 9/13. Patient complains of chest pain today. This has been intermittent for the past several months. It is under the left breast without radiation. No associated symptoms. Pain is not pleuritic, positional or exertional. Last approximately 15 minutes and resolved spontaneously. Occasionally takes tramadol for these pains. Also with some dyspnea on exertion but no orthopnea, PND or pedal edema.  Current Outpatient Prescriptions  Medication Sig Dispense Refill  . atorvastatin (LIPITOR) 40 MG tablet TAKE 1 TABLET BY MOUTH EVERY DAY 90 tablet 1  . butalbital-acetaminophen-caffeine (FIORICET, ESGIC) 50-325-40 MG per tablet TAKE 1 TO 2 TABLETS BY MOUTH EVERY 6 HOURS AS NEEDED FOR HEADACHE 120 tablet 1  . clopidogrel (PLAVIX) 75 MG tablet Take 1 tablet (75 mg total) by mouth every evening. 90 tablet 1  . diclofenac (VOLTAREN) 75 MG EC tablet TAKE 1 TABLET BY MOUTH TWICE A DAY 180 tablet 1  . levothyroxine (SYNTHROID, LEVOTHROID) 125 MCG tablet TAKE 1 TABLET BY MOUTH DAILY BEFORE BREAKFAST. 90 tablet 3  . tetrahydrozoline 0.05 % ophthalmic solution Place 2 drops into both eyes as needed. Red eyes    . traMADol (ULTRAM) 50 MG tablet TAKE 1 TABLET BY MOUTH EVERY 6 HOURS AS NEEDED FOR PAIN 90 tablet 2  . verapamil (VERELAN PM) 360 MG 24 hr capsule TAKE  1 CAPSULE BY MOUTH AT BEDTIME. 90 capsule 1   No current facility-administered medications for this visit.     Past Medical History  Diagnosis Date  . Hyperlipidemia   . History of colonic polyps   . Late effect of adverse effect of drug, medicinal or biological substance   . History of tension headache   . Heart palpitations   . Diverticulitis 09-05-11    hx. gastritis, diverticulitis x2 -now surgery planned  . Patent foramen ovale     Small - unable to be closed -   . Hypertension 09-05-11    tx. Verapamil  . Hypothyroidism 09-05-11    Supplement used  . Degenerative joint disease of cervical spine 09-05-11    Cervival area, now some osteoarthritis-lower back and Rt. shoulder  . Osteoarthritis 112-17-12    spine and rt. hip, rt. shoulder  . Sleep apnea 09-05-11    no cpap ever, had surgery to remove cartilage, no problems now  . Stroke 09-05-11    2006/2009-(loss of memory, balance issues remains occ.)  . PONV (postoperative nausea and vomiting)   . Shortness of breath     occasionally  . Headache(784.0)   . Bruises easily   . DDD (degenerative disc disease)   . DIVERTICULITIS, ACUTE 02/10/2010    Qualifier: Diagnosis of  By: Amador Cunas  MD, Janett Labella   . URI 09/02/2008    Qualifier: Diagnosis of  By: Amador Cunas  MD, Janett Labella   . TIA 09/28/2007    Qualifier: Diagnosis of  By: Amador Cunas  MD, Janett Labella   . SORE THROAT 04/30/2009  Qualifier: Diagnosis of  By: Amador Cunas  MD, Janett Labella   . NECK PAIN, ACUTE 09/21/2010    Qualifier: Diagnosis of  By: Amador Cunas  MD, Antionette Poles PAIN 09/21/2010    Qualifier: Diagnosis of  By: Amador Cunas  MD, Janett Labella   . CHEST PAIN UNSPECIFIED 05/20/2010    Annotation: Would like to see Dr. Gala Romney. Qualifier: Diagnosis of  By: Carole Binning CMA Domenic Moras      Past Surgical History  Procedure Laterality Date  . Abdominal hysterectomy    . Thyroidectomy, partial  09-05-11  . Elbow surgery  09-05-11    left elbow -ligament repair  .  Umbilical hernia repair  yrs ago  . Colon resection  09/07/2011    Procedure: LAPAROSCOPIC SIGMOID COLON RESECTION;  Surgeon: Mariella Saa, MD;  Location: [facility];  Service: General;  Laterality: N/A;  with proctoscopy  . Laparoscopy  09/07/2011    Procedure: LAPAROSCOPY DIAGNOSTIC;  Surgeon: Roselle Locus II;  Location: [facility];  Service: Gynecology;  Laterality: N/A;  . Salpingoophorectomy  09/07/2011    Procedure: SALPINGO OOPHERECTOMY;  Surgeon: Roselle Locus II;  Location: [facility];  Service: Gynecology;  Laterality: Right;  . Tubal ligation  1983  . Ventral hernia repair  06/20/2012    Procedure: LAPAROSCOPIC VENTRAL HERNIA;  Surgeon: Mariella Saa, MD;  Location: [facility];  Service: General;  Laterality: N/A;  Laparoscopic Repair of Ventral Hernia with mesh  . Ventral hernia repair N/A 12/13/2013    Procedure: LAPAROSCOPIC REPAIR RECURRENT VENTRAL INCISIONAL  HERNIA;  Surgeon: Mariella Saa, MD;  Location: [facility];  Service: General;  Laterality: N/A;  . Insertion of mesh N/A 12/13/2013    Procedure: INSERTION OF MESH;  Surgeon: Mariella Saa, MD;  Location: [facility];  Service: General;  Laterality: N/A;    Social History   Social History  . Marital Status: Married    Spouse Name: N/A  . Number of Children: N/A  . Years of Education: N/A   Occupational History  . Not on file.   Social History Main Topics  . Smoking status: Former Smoker -- 0.50 packs/day for 5 years    Types: Cigarettes    Quit date: 09/19/1968  . Smokeless tobacco: Never Used  . Alcohol Use: Yes     Comment:  -rare  . Drug Use: No  . Sexual Activity: Yes   Other Topics Concern  . Not on file   Social History Narrative    ROS: no fevers or chills, productive cough, hemoptysis, dysphasia, odynophagia, melena, hematochezia, dysuria, hematuria, rash, seizure activity, orthopnea, PND, pedal edema, claudication. Remaining systems are negative.  Physical Exam: Well-developed  well-nourished in no acute distress.  Skin is warm and dry.  HEENT is normal.  Neck is supple.  Chest is clear to auscultation with normal expansion.  Cardiovascular exam is regular rate and rhythm.  Abdominal exam nontender or distended. No masses palpated. Extremities show no edema. neuro grossly intact  ECG sinus rhythm at a rate of 60. Left ventricular hypertrophy. Nonspecific ST changes.

## 2015-05-06 ENCOUNTER — Other Ambulatory Visit: Payer: Self-pay | Admitting: Internal Medicine

## 2015-05-07 ENCOUNTER — Ambulatory Visit (INDEPENDENT_AMBULATORY_CARE_PROVIDER_SITE_OTHER): Payer: Medicare PPO | Admitting: Cardiology

## 2015-05-07 ENCOUNTER — Encounter: Payer: Self-pay | Admitting: *Deleted

## 2015-05-07 ENCOUNTER — Encounter: Payer: Self-pay | Admitting: Cardiology

## 2015-05-07 VITALS — BP 140/84 | HR 60 | Ht 65.5 in | Wt 189.2 lb

## 2015-05-07 DIAGNOSIS — I10 Essential (primary) hypertension: Secondary | ICD-10-CM | POA: Diagnosis not present

## 2015-05-07 DIAGNOSIS — R002 Palpitations: Secondary | ICD-10-CM

## 2015-05-07 DIAGNOSIS — R072 Precordial pain: Secondary | ICD-10-CM | POA: Diagnosis not present

## 2015-05-07 DIAGNOSIS — R079 Chest pain, unspecified: Secondary | ICD-10-CM | POA: Diagnosis not present

## 2015-05-07 NOTE — Assessment & Plan Note (Signed)
Continue statin. 

## 2015-05-07 NOTE — Assessment & Plan Note (Signed)
Symptoms atypical. Plan nuclear study for risk stratification. She will require nuclear imaging because of inability to exercise fully and baseline left ventricular hypertrophy on ECG.

## 2015-05-07 NOTE — Patient Instructions (Signed)
Your physician recommends that you schedule a follow-up appointment in:  AS NEEDED PENDING TEST RESULTS  Your physician has requested that you have a lexiscan myoview. For further information please visit [email or web address]. Please follow instruction sheet, as given.   

## 2015-05-07 NOTE — Assessment & Plan Note (Signed)
Blood pressure controlled. Continue present medications. 

## 2015-05-07 NOTE — Assessment & Plan Note (Signed)
No recent symptoms. Continue calcium blocker.

## 2015-05-20 ENCOUNTER — Telehealth (HOSPITAL_COMMUNITY): Payer: Self-pay | Admitting: Radiology

## 2015-05-20 NOTE — Telephone Encounter (Signed)
Encounter complete. 

## 2015-05-21 DIAGNOSIS — N760 Acute vaginitis: Secondary | ICD-10-CM | POA: Diagnosis not present

## 2015-05-22 ENCOUNTER — Ambulatory Visit (HOSPITAL_COMMUNITY)
Admission: RE | Admit: 2015-05-22 | Discharge: 2015-05-22 | Disposition: A | Discharge status: Home / Self Care | Payer: Medicare PPO | Source: Ambulatory Visit | Attending: Cardiology | Admitting: Cardiology

## 2015-05-22 ENCOUNTER — Telehealth: Payer: Self-pay | Admitting: Nurse Practitioner

## 2015-05-22 ENCOUNTER — Other Ambulatory Visit: Payer: Self-pay | Admitting: Internal Medicine

## 2015-05-22 DIAGNOSIS — R9439 Abnormal result of other cardiovascular function study: Secondary | ICD-10-CM | POA: Insufficient documentation

## 2015-05-22 DIAGNOSIS — R079 Chest pain, unspecified: Secondary | ICD-10-CM

## 2015-05-22 DIAGNOSIS — R0602 Shortness of breath: Secondary | ICD-10-CM | POA: Insufficient documentation

## 2015-05-22 DIAGNOSIS — F17200 Nicotine dependence, unspecified, uncomplicated: Secondary | ICD-10-CM | POA: Insufficient documentation

## 2015-05-22 DIAGNOSIS — R0609 Other forms of dyspnea: Secondary | ICD-10-CM | POA: Insufficient documentation

## 2015-05-22 DIAGNOSIS — R002 Palpitations: Secondary | ICD-10-CM | POA: Insufficient documentation

## 2015-05-22 DIAGNOSIS — R55 Syncope and collapse: Secondary | ICD-10-CM | POA: Diagnosis not present

## 2015-05-22 LAB — MYOCARDIAL PERFUSION IMAGING
CHL CUP RESTING HR STRESS: 53 {beats}/min
CSEPPHR: 75 {beats}/min
LV dias vol: 85 mL
LVSYSVOL: 39 mL
SDS: 3
SRS: 5
SSS: 8
TID: 1.01

## 2015-05-22 MED ORDER — TECHNETIUM TC 99M SESTAMIBI GENERIC - CARDIOLITE
30.3000 | Freq: Once | INTRAVENOUS | Status: AC | PRN
  Administered 2015-05-22: 30.3 via INTRAVENOUS

## 2015-05-22 MED ORDER — TECHNETIUM TC 99M SESTAMIBI GENERIC - CARDIOLITE
10.4000 | Freq: Once | INTRAVENOUS | Status: AC | PRN
  Administered 2015-05-22: 10 via INTRAVENOUS

## 2015-05-22 MED ORDER — REGADENOSON 0.4 MG/5ML IV SOLN
0.4000 mg | Freq: Once | INTRAVENOUS | Status: AC
  Administered 2015-05-22: 0.4 mg via INTRAVENOUS

## 2015-05-26 ENCOUNTER — Telehealth: Payer: Self-pay | Admitting: *Deleted

## 2015-05-26 ENCOUNTER — Encounter: Payer: Self-pay | Admitting: *Deleted

## 2015-05-26 NOTE — Telephone Encounter (Signed)
got the fm hx info.

## 2015-05-27 ENCOUNTER — Ambulatory Visit (INDEPENDENT_AMBULATORY_CARE_PROVIDER_SITE_OTHER): Payer: Medicare PPO | Admitting: Nurse Practitioner

## 2015-05-27 ENCOUNTER — Encounter: Payer: Self-pay | Admitting: Nurse Practitioner

## 2015-05-27 VITALS — BP 148/84 | HR 60 | Ht 66.0 in | Wt 188.8 lb

## 2015-05-27 DIAGNOSIS — I10 Essential (primary) hypertension: Secondary | ICD-10-CM

## 2015-05-27 DIAGNOSIS — E785 Hyperlipidemia, unspecified: Secondary | ICD-10-CM | POA: Diagnosis not present

## 2015-05-27 DIAGNOSIS — R9439 Abnormal result of other cardiovascular function study: Secondary | ICD-10-CM

## 2015-05-27 DIAGNOSIS — I200 Unstable angina: Secondary | ICD-10-CM

## 2015-05-27 LAB — CBC WITH DIFFERENTIAL/PLATELET
BASOS PCT: 0.4 % (ref 0.0–3.0)
Basophils Absolute: 0 10*3/uL (ref 0.0–0.1)
EOS ABS: 0.1 10*3/uL (ref 0.0–0.7)
Eosinophils Relative: 1.6 % (ref 0.0–5.0)
HEMATOCRIT: 39.9 % (ref 36.0–46.0)
HEMOGLOBIN: 13.6 g/dL (ref 12.0–15.0)
LYMPHS PCT: 32.7 % (ref 12.0–46.0)
Lymphs Abs: 2 10*3/uL (ref 0.7–4.0)
MCHC: 34.1 g/dL (ref 30.0–36.0)
MCV: 89.5 fl (ref 78.0–100.0)
MONOS PCT: 9.6 % (ref 3.0–12.0)
Monocytes Absolute: 0.6 10*3/uL (ref 0.1–1.0)
NEUTROS ABS: 3.4 10*3/uL (ref 1.4–7.7)
Neutrophils Relative %: 55.7 % (ref 43.0–77.0)
PLATELETS: 167 10*3/uL (ref 150.0–400.0)
RBC: 4.45 Mil/uL (ref 3.87–5.11)
RDW: 13.7 % (ref 11.5–15.5)
WBC: 6.2 10*3/uL (ref 4.0–10.5)

## 2015-05-27 LAB — PROTIME-INR
INR: 1 ratio (ref 0.8–1.0)
PROTHROMBIN TIME: 10.8 s (ref 9.6–13.1)

## 2015-05-27 LAB — BASIC METABOLIC PANEL
BUN: 14 mg/dL (ref 6–23)
CHLORIDE: 107 meq/L (ref 96–112)
CO2: 27 meq/L (ref 19–32)
Calcium: 9.7 mg/dL (ref 8.4–10.5)
Creatinine, Ser: 0.71 mg/dL (ref 0.40–1.20)
GFR: 104.64 mL/min (ref >60.00)
Glucose, Bld: 90 mg/dL (ref 70–99)
POTASSIUM: 3.7 meq/L (ref 3.5–5.1)
SODIUM: 141 meq/L (ref 135–145)

## 2015-05-27 MED ORDER — ASPIRIN EC 81 MG PO TBEC: 81.0000 mg | DELAYED_RELEASE_TABLET | Freq: Every day | ORAL | Status: DC

## 2015-05-27 NOTE — Patient Instructions (Signed)
Medication Instructions:  START Aspirin  daily  Labwork: Bmet,Cbc, Pt/Inr today  Testing/Procedures: Your physician has requested that you have a cardiac catheterization. Cardiac catheterization is used to diagnose and/or treat various heart conditions. Doctors may recommend this procedure for a number of different reasons. The most common reason is to evaluate chest pain. Chest pain can be a symptom of coronary artery disease (CAD), and cardiac catheterization can show whether plaque is narrowing or blocking your heart's arteries. This procedure is also used to evaluate the valves, as well as measure the blood flow and oxygen levels in different parts of your heart. For further information please visit [email or web address] Please follow instruction sheet, as given.   Follow-Up: Your physician recommends that you schedule a follow-up appointment in: 2 weeks with Dr.Crenshaw/ or an APP at [facility]   Any Other Special Instructions Will Be Listed Below (If Applicable).

## 2015-05-27 NOTE — Progress Notes (Signed)
Patient Name: Abigail [name] Date of Encounter: 05/27/2015  Primary Care Provider:  Rogelia Boga, MD Primary Cardiologist:  B. Jens Som, MD   Chief Complaint  69 year old female status post recent stress testing related to chest pain, who presents today secondary to abnormal stress testing.  Past Medical History   Past Medical History  Diagnosis Date  . Hyperlipidemia   . History of colonic polyps   . Late effect of adverse effect of drug, medicinal or biological substance   . History of tension headache   . Heart palpitations   . Diverticulitis 09-05-11    hx. gastritis, diverticulitis x2 -now surgery planned  . Patent foramen ovale     Small - unable to be closed -   . Essential hypertension 09-05-11    tx. Verapamil  . Hypothyroidism 09-05-11    Supplement used  . Degenerative joint disease of cervical spine 09-05-11    Cervival area, now some osteoarthritis-lower back and Rt. shoulder  . Osteoarthritis 112-17-12    spine and rt. hip, rt. shoulder  . Sleep apnea 09-05-11    no cpap ever, had surgery to remove cartilage, no problems now  . Stroke 09-05-11    2006/2009-(loss of memory, balance issues remains occ.)  . PONV (postoperative nausea and vomiting)   . Headache(784.0)   . Bruises easily   . DDD (degenerative disc disease)   . DIVERTICULITIS, ACUTE 02/10/2010  . URI 09/02/2008  . TIA 09/28/2007  . SORE THROAT 04/30/2009  . NECK PAIN, ACUTE 09/21/2010  . GROIN PAIN 09/21/2010  . Unstable angina     a. 05/2010 Cath: nl cors, EF 55%;  b. 05/2015 Lexiscan MV: small, severe, fixed apical defect and a small, severe, reversible inf lateral defect w/ apical thinning and mild ischemia, EF 54%.   Past Surgical History  Procedure Laterality Date  . Abdominal hysterectomy    . Thyroidectomy, partial  09-05-11  . Elbow surgery  09-05-11    left elbow -ligament repair  . Umbilical hernia repair  yrs ago  . Colon resection  09/07/2011    Procedure: LAPAROSCOPIC  SIGMOID COLON RESECTION;  Surgeon: Mariella Saa, MD;  Location: [facility];  Service: General;  Laterality: N/A;  with proctoscopy  . Laparoscopy  09/07/2011    Procedure: LAPAROSCOPY DIAGNOSTIC;  Surgeon: Roselle Locus II;  Location: [facility];  Service: Gynecology;  Laterality: N/A;  . Salpingoophorectomy  09/07/2011    Procedure: SALPINGO OOPHERECTOMY;  Surgeon: Roselle Locus II;  Location: [facility];  Service: Gynecology;  Laterality: Right;  . Tubal ligation  1983  . Ventral hernia repair  06/20/2012    Procedure: LAPAROSCOPIC VENTRAL HERNIA;  Surgeon: Mariella Saa, MD;  Location: [facility];  Service: General;  Laterality: N/A;  Laparoscopic Repair of Ventral Hernia with mesh  . Ventral hernia repair N/A 12/13/2013    Procedure: LAPAROSCOPIC REPAIR RECURRENT VENTRAL INCISIONAL  HERNIA;  Surgeon: Mariella Saa, MD;  Location: [facility];  Service: General;  Laterality: N/A;  . Insertion of mesh N/A 12/13/2013    Procedure: INSERTION OF MESH;  Surgeon: Mariella Saa, MD;  Location: [facility];  Service: General;  Laterality: N/A;    Allergies  Allergies  Allergen Reactions  . Aggrenox [Aspirin-Dipyridamole Er] Nausea And Vomiting and Other (See Comments)    Headache   . Dilaudid [Hydromorphone Hcl] Shortness Of Breath and Nausea And Vomiting    Able to take morphine without issue  . Shellfish Allergy Shortness Of Breath  and Rash    Only shrimp allergy  . Hydrocodone Nausea And Vomiting    Can take morphine without issue  . Cephalexin Itching and Rash  . Codeine Phosphate Nausea And Vomiting    REACTION: unspecified  . Meperidine Hcl Nausea And Vomiting  . Percocet [Oxycodone-Acetaminophen] Nausea And Vomiting    Can take morphine without issue    HPI  69 year old female with the above complex past medical history. She has a history of chest pain status post normal catheterization in 2011. She also has a prior history of left medullary stroke with some resultant loss of  memory and balance issues. Over the past several months, she's been experiencing intermittent rest and exertional substernal chest pressure associated with dyspnea. Symptoms vary in duration and generally resolve spontaneously. There are no specific provocative factors. She recently saw Dr. Jens Som on August 18 and subsequently underwent stress testing which revealed inferolateral ischemia with normal LV function. Since then, she is continued to have intermittent chest discomfort several times per day. She presents today to discuss and schedule diagnostic catheterization. She denies PND, orthopnea, syncope, edema, or early satiety.  Home Medications  Prior to Admission medications   Medication Sig Start Date End Date Taking? Authorizing Provider  atorvastatin (LIPITOR) 40 MG tablet TAKE 1 TABLET BY MOUTH EVERY DAY 05/06/15  Yes Gordy Savers, MD  butalbital-acetaminophen-caffeine (FIORICET, ESGIC) [phone] MG per tablet TAKE 1 TO 2 TABLETS BY MOUTH EVERY 6 HOURS AS NEEDED FOR HEADACHE 04/10/15  Yes Gordy Savers, MD  clopidogrel (PLAVIX) 75 MG tablet Take 1 tablet (75 mg total) by mouth every evening. 06/18/14  Yes Gordy Savers, MD  diclofenac (VOLTAREN) 75 MG EC tablet TAKE 1 TABLET BY MOUTH TWICE A DAY 11/13/14  Yes Gordy Savers, MD  levothyroxine (SYNTHROID, LEVOTHROID) 125 MCG tablet TAKE 1 TABLET BY MOUTH DAILY BEFORE BREAKFAST. 04/22/15  Yes Gordy Savers, MD  tetrahydrozoline 0.05 % ophthalmic solution Place 2 drops into both eyes as needed. Red eyes   Yes Historical Provider, MD  traMADol (ULTRAM) 50 MG tablet TAKE 1 TABLET BY MOUTH EVERY 6 HOURS AS NEEDED FOR PAIN 03/30/15  Yes Gordy Savers, MD  verapamil (VERELAN PM) 360 MG 24 hr capsule TAKE 1 CAPSULE BY MOUTH AT BEDTIME. 05/22/15  Yes Gordy Savers, MD  aspirin EC 81 MG tablet Take 1 tablet (81 mg total) by mouth daily. 05/27/15   Ok Anis, NP    Family History  Family History  Problem  Relation Age of Onset  . Arrhythmia Mother   . Hypertension Mother   . Stroke Mother   . Stroke Maternal Grandmother   . Diabetes Paternal Grandmother     Social History  Social History   Social History  . Marital Status: Married    Spouse Name: N/A  . Number of Children: N/A  . Years of Education: N/A   Occupational History  . Not on file.   Social History Main Topics  . Smoking status: Former Smoker -- 0.50 packs/day for 5 years    Types: Cigarettes    Quit date: 09/19/1968  . Smokeless tobacco: Never Used  . Alcohol Use: 0.0 oz/week    0 Standard drinks or equivalent per week     Comment: previously a moderate drinker but none in several  years.  . Drug Use: No  . Sexual Activity: Yes   Other Topics Concern  . Not on file   Social History Narrative  Lives in [place] with her husband.  She does not routinely exercise.     Review of Systems  General:  No chills, fever, night sweats or weight changes.  Cardiovascular:  Positive chest pain, positive dyspnea on exertion, no edema, orthopnea, palpitations, paroxysmal nocturnal dyspnea. Dermatological: No rash, lesions/masses Respiratory: No cough, dyspnea Urologic: No hematuria, dysuria Abdominal:   No nausea, vomiting, diarrhea, bright red blood per rectum, melena, or hematemesis Neurologic:  She does have some balance issues and is scheduled to follow-up with neurology next week. No visual changes, wkns, changes in mental status. All other systems reviewed and are otherwise negative except as noted above.  Physical Exam  VS:  BP 148/84 mmHg  Pulse 60  Ht  (1.676 m)  Wt 188 lb 12.8 oz (85.639 kg)  BMI 30.49 kg/m2 , BMI Body mass index is 30.49 kg/(m^2). GEN: Well nourished, well developed, in no acute distress. HEENT: normal. Neck: Supple, no JVD, carotid bruits, or masses. Cardiac: RRR, no murmurs, rubs, or gallops. No clubbing, cyanosis, edema.  Radials/DP/PT 2+ and equal bilaterally.  Respiratory:   Respirations regular and unlabored, clear to auscultation bilaterally. GI: Soft, nontender, nondistended, BS + x 4. MS: no deformity or atrophy. Skin: warm and dry, no rash. Neuro:  Strength and sensation are intact. Psych: Normal affect.  Accessory Clinical Findings  Labs pending  Assessment & Plan  1.  Unstable angina: Patient has a several month history of progressive rest and exertional substernal chest discomfort associated with dyspnea. There are no specific aggravating or alleviating factors. She underwent stress testing revealing inferolateral ischemia with normal LV function. She presents today to schedule diagnostic catheterization.  The patient understands that risks include but are not limited to stroke (1 in 1000), death (1 in 1000), kidney failure [usually temporary] (1 in 500), bleeding (1 in 200), allergic reaction [possibly serious] (1 in 200), and agrees to proceed.  She has been scheduled for Monday of next week. She is on chronic Plavix therapy in the setting of prior stroke. I will add low-dose aspirin today. She remains on statin.  2. Essential hypertension: Blood pressure is elevated in clinic today though she reports that it has been normal on prior checks. I will not make changes to her antihypertensives regimen today which currently is only verapamil. Can follow-up during catheterization.  3. Hyperlipidemia: She remains on Lipitor therapy.  4. Disposition: Scheduled for diagnostic catheterization on Monday, September 12.  Nicolasa Ducking, NP 05/27/2015, 11:56 AM

## 2015-06-01 ENCOUNTER — Encounter (HOSPITAL_COMMUNITY)
Admission: RE | Disposition: A | Discharge status: Home / Self Care | Payer: Self-pay | Source: Ambulatory Visit | Attending: Cardiovascular Disease

## 2015-06-01 ENCOUNTER — Ambulatory Visit (HOSPITAL_COMMUNITY)
Admission: RE | Admit: 2015-06-01 | Discharge: 2015-06-01 | Disposition: A | Discharge status: Home / Self Care | Payer: Medicare PPO | Source: Ambulatory Visit | Attending: Cardiovascular Disease | Admitting: Cardiovascular Disease

## 2015-06-01 DIAGNOSIS — Z87891 Personal history of nicotine dependence: Secondary | ICD-10-CM | POA: Diagnosis not present

## 2015-06-01 DIAGNOSIS — R072 Precordial pain: Secondary | ICD-10-CM | POA: Diagnosis not present

## 2015-06-01 DIAGNOSIS — Z7982 Long term (current) use of aspirin: Secondary | ICD-10-CM | POA: Diagnosis not present

## 2015-06-01 DIAGNOSIS — I10 Essential (primary) hypertension: Secondary | ICD-10-CM | POA: Insufficient documentation

## 2015-06-01 DIAGNOSIS — E039 Hypothyroidism, unspecified: Secondary | ICD-10-CM | POA: Insufficient documentation

## 2015-06-01 DIAGNOSIS — Q211 Atrial septal defect: Secondary | ICD-10-CM | POA: Diagnosis not present

## 2015-06-01 DIAGNOSIS — I2510 Atherosclerotic heart disease of native coronary artery without angina pectoris: Secondary | ICD-10-CM | POA: Diagnosis not present

## 2015-06-01 DIAGNOSIS — Z8673 Personal history of transient ischemic attack (TIA), and cerebral infarction without residual deficits: Secondary | ICD-10-CM | POA: Diagnosis not present

## 2015-06-01 DIAGNOSIS — Z8249 Family history of ischemic heart disease and other diseases of the circulatory system: Secondary | ICD-10-CM | POA: Insufficient documentation

## 2015-06-01 DIAGNOSIS — Z7902 Long term (current) use of antithrombotics/antiplatelets: Secondary | ICD-10-CM | POA: Insufficient documentation

## 2015-06-01 DIAGNOSIS — E785 Hyperlipidemia, unspecified: Secondary | ICD-10-CM | POA: Diagnosis not present

## 2015-06-01 DIAGNOSIS — R0789 Other chest pain: Secondary | ICD-10-CM | POA: Insufficient documentation

## 2015-06-01 DIAGNOSIS — I200 Unstable angina: Secondary | ICD-10-CM

## 2015-06-01 DIAGNOSIS — G4730 Sleep apnea, unspecified: Secondary | ICD-10-CM | POA: Diagnosis not present

## 2015-06-01 DIAGNOSIS — M1990 Unspecified osteoarthritis, unspecified site: Secondary | ICD-10-CM | POA: Insufficient documentation

## 2015-06-01 DIAGNOSIS — Z823 Family history of stroke: Secondary | ICD-10-CM | POA: Diagnosis not present

## 2015-06-01 HISTORY — PX: CARDIAC CATHETERIZATION: SHX172

## 2015-06-01 SURGERY — LEFT HEART CATH AND CORONARY ANGIOGRAPHY

## 2015-06-01 MED ORDER — SODIUM CHLORIDE 0.9 % IJ SOLN: 3.0000 mL | Freq: Two times a day (BID) | INTRAMUSCULAR | Status: DC

## 2015-06-01 MED ORDER — VERAPAMIL HCL 2.5 MG/ML IV SOLN
INTRAVENOUS | Status: AC
  Filled 2015-06-01: qty 2

## 2015-06-01 MED ORDER — FENTANYL CITRATE (PF) 100 MCG/2ML IJ SOLN
INTRAMUSCULAR | Status: DC | PRN
  Administered 2015-06-01: 50 ug via INTRAVENOUS
  Administered 2015-06-01: 25 ug via INTRAVENOUS

## 2015-06-01 MED ORDER — SODIUM CHLORIDE 0.9 % IJ SOLN: 3.0000 mL | INTRAMUSCULAR | Status: DC | PRN

## 2015-06-01 MED ORDER — SODIUM CHLORIDE 0.9 % WEIGHT BASED INFUSION
1.0000 mL/kg/h | INTRAVENOUS | Status: DC
  Administered 2015-06-01: 1 mL/kg/h via INTRAVENOUS

## 2015-06-01 MED ORDER — SODIUM CHLORIDE 0.9 % IV SOLN
250.0000 mL | INTRAVENOUS | Status: DC | PRN
Start: 2015-06-01 — End: 2015-06-01

## 2015-06-01 MED ORDER — VERAPAMIL HCL 2.5 MG/ML IV SOLN
INTRAVENOUS | Status: DC | PRN
  Administered 2015-06-01: 12:00:00 via INTRA_ARTERIAL

## 2015-06-01 MED ORDER — MIDAZOLAM HCL 2 MG/2ML IJ SOLN
INTRAMUSCULAR | Status: AC
  Filled 2015-06-01: qty 4

## 2015-06-01 MED ORDER — ASPIRIN 81 MG PO CHEW
CHEWABLE_TABLET | ORAL | Status: AC
  Filled 2015-06-01: qty 1

## 2015-06-01 MED ORDER — HEPARIN (PORCINE) IN NACL 2-0.9 UNIT/ML-% IJ SOLN
INTRAMUSCULAR | Status: AC
  Filled 2015-06-01: qty 1500

## 2015-06-01 MED ORDER — SODIUM CHLORIDE 0.9 % WEIGHT BASED INFUSION
3.0000 mL/kg/h | INTRAVENOUS | Status: DC
  Administered 2015-06-01: 3 mL/kg/h via INTRAVENOUS

## 2015-06-01 MED ORDER — ASPIRIN 81 MG PO CHEW
81.0000 mg | CHEWABLE_TABLET | ORAL | Status: AC
  Administered 2015-06-01: 81 mg via ORAL

## 2015-06-01 MED ORDER — SODIUM CHLORIDE 0.9 % IV SOLN: 250.0000 mL | INTRAVENOUS | Status: DC | PRN

## 2015-06-01 MED ORDER — FENTANYL CITRATE (PF) 100 MCG/2ML IJ SOLN
INTRAMUSCULAR | Status: AC
  Filled 2015-06-01: qty 4

## 2015-06-01 MED ORDER — SODIUM CHLORIDE 0.9 % IV SOLN: INTRAVENOUS | Status: AC

## 2015-06-01 MED ORDER — LIDOCAINE HCL (PF) 1 % IJ SOLN
INTRAMUSCULAR | Status: AC
  Filled 2015-06-01: qty 30

## 2015-06-01 MED ORDER — IOHEXOL 350 MG/ML SOLN
INTRAVENOUS | Status: DC | PRN
  Administered 2015-06-01: 80 mL via INTRAVENOUS

## 2015-06-01 MED ORDER — HEPARIN SODIUM (PORCINE) 1000 UNIT/ML IJ SOLN
INTRAMUSCULAR | Status: DC | PRN
  Administered 2015-06-01: 4000 [IU] via INTRAVENOUS

## 2015-06-01 MED ORDER — MIDAZOLAM HCL 2 MG/2ML IJ SOLN
INTRAMUSCULAR | Status: DC | PRN
  Administered 2015-06-01: 1 mg via INTRAVENOUS

## 2015-06-01 MED ORDER — LIDOCAINE HCL (PF) 1 % IJ SOLN
INTRAMUSCULAR | Status: DC | PRN
  Administered 2015-06-01: 12:00:00

## 2015-06-01 SURGICAL SUPPLY — 12 items
CATH INFINITI 5 FR JL3.5 (CATHETERS) ×3 IMPLANT
CATH INFINITI 5FR ANG PIGTAIL (CATHETERS) ×3 IMPLANT
CATH INFINITI JR4 5F (CATHETERS) ×3 IMPLANT
DEVICE RAD COMP TR BAND LRG (VASCULAR PRODUCTS) ×3 IMPLANT
GLIDESHEATH SLEND SS 6F .021 (SHEATH) ×3 IMPLANT
KIT HEART LEFT (KITS) ×3 IMPLANT
PACK CARDIAC CATHETERIZATION (CUSTOM PROCEDURE TRAY) ×3 IMPLANT
SYR MEDRAD MARK V 150ML (SYRINGE) ×3 IMPLANT
TRANSDUCER W/STOPCOCK (MISCELLANEOUS) ×3 IMPLANT
TUBING CIL FLEX 10 FLL-RA (TUBING) ×3 IMPLANT
WIRE HI TORQ VERSACORE-J 145CM (WIRE) ×2 IMPLANT
WIRE SAFE-T 1.5MM-J .035X260CM (WIRE) ×3 IMPLANT

## 2015-06-01 NOTE — Interval H&P Note (Signed)
History and Physical Interval Note:  06/01/2015 11:28 AM  Abigail [name]  has presented today for cardiac cath with the diagnosis of abnoramal stress test/chest pain. The various methods of treatment have been discussed with the patient and family. After consideration of risks, benefits and other options for treatment, the patient has consented to  Procedure(s): Left Heart Cath and Coronary Angiography (N/A) as a surgical intervention .  The patient's history has been reviewed, patient examined, no change in status, stable for surgery.  I have reviewed the patient's chart and labs.  Questions were answered to the patient's satisfaction.    Cath Lab Visit (complete for each Cath Lab visit)  Clinical Evaluation Leading to the Procedure:   ACS: No.  Non-ACS:    Anginal Classification: CCS III  Anti-ischemic medical therapy: Minimal Therapy (1 class of medications)  Non-Invasive Test Results: No non-invasive testing performed  Prior CABG: No previous CABG         Abigail [name]

## 2015-06-01 NOTE — Discharge Instructions (Signed)
Radial Site Care °Refer to this sheet in the next few weeks. These instructions provide you with information on caring for yourself after your procedure. Your caregiver may also give you more specific instructions. Your treatment has been planned according to current medical practices, but problems sometimes occur. Call your caregiver if you have any problems or questions after your procedure. °HOME CARE INSTRUCTIONS °· You may shower the day after the procedure. Remove the bandage (dressing) and gently wash the site with plain soap and water. Gently pat the site dry. °· Do not apply powder or lotion to the site. °· Do not submerge the affected site in water for 3 to 5 days. °· Inspect the site at least twice daily. °· Do not flex or bend the affected arm for 24 hours. °· No lifting over 5 pounds (2.3 kg) for 5 days after your procedure. °· Do not drive home if you are discharged the same day of the procedure. Have someone else drive you. °· You may drive 24 hours after the procedure unless otherwise instructed by your caregiver. °· Do not operate machinery or power tools for 24 hours. °· A responsible adult should be with you for the first 24 hours after you arrive home. °What to expect: °· Any bruising will usually fade within 1 to 2 weeks. °· Blood that collects in the tissue (hematoma) may be painful to the touch. It should usually decrease in size and tenderness within 1 to 2 weeks. °SEEK IMMEDIATE MEDICAL CARE IF: °· You have unusual pain at the radial site. °· You have redness, warmth, swelling, or pain at the radial site. °· You have drainage (other than a small amount of blood on the dressing). °· You have chills. °· You have a fever or persistent symptoms for more than 72 hours. °· You have a fever and your symptoms suddenly get worse. °· Your arm becomes pale, cool, tingly, or numb. °· You have heavy bleeding from the site. Hold pressure on the site and call 911. °Document Released: 10/08/2010 Document  Revised: 11/28/2011 Document Reviewed: 10/08/2010 °[employer]® Patient Information ©2015 [employer]. This information is not intended to replace advice given to you by your health care provider. Make sure you discuss any questions you have with your health care provider. ° °

## 2015-06-01 NOTE — H&P (View-Only) (Signed)
Patient Name: Abigail [name] Date of Encounter: 05/27/2015  Primary Care Provider:  Rogelia Boga, MD Primary Cardiologist:  B. Jens Som, MD   Chief Complaint  69 year old female status post recent stress testing related to chest pain, who presents today secondary to abnormal stress testing.  Past Medical History   Past Medical History  Diagnosis Date  . Hyperlipidemia   . History of colonic polyps   . Late effect of adverse effect of drug, medicinal or biological substance   . History of tension headache   . Heart palpitations   . Diverticulitis 09-05-11    hx. gastritis, diverticulitis x2 -now surgery planned  . Patent foramen ovale     Small - unable to be closed -   . Essential hypertension 09-05-11    tx. Verapamil  . Hypothyroidism 09-05-11    Supplement used  . Degenerative joint disease of cervical spine 09-05-11    Cervival area, now some osteoarthritis-lower back and Rt. shoulder  . Osteoarthritis 112-17-12    spine and rt. hip, rt. shoulder  . Sleep apnea 09-05-11    no cpap ever, had surgery to remove cartilage, no problems now  . Stroke 09-05-11    2006/2009-(loss of memory, balance issues remains occ.)  . PONV (postoperative nausea and vomiting)   . Headache(784.0)   . Bruises easily   . DDD (degenerative disc disease)   . DIVERTICULITIS, ACUTE 02/10/2010  . URI 09/02/2008  . TIA 09/28/2007  . SORE THROAT 04/30/2009  . NECK PAIN, ACUTE 09/21/2010  . GROIN PAIN 09/21/2010  . Unstable angina     a. 05/2010 Cath: nl cors, EF 55%;  b. 05/2015 Lexiscan MV: small, severe, fixed apical defect and a small, severe, reversible inf lateral defect w/ apical thinning and mild ischemia, EF 54%.   Past Surgical History  Procedure Laterality Date  . Abdominal hysterectomy    . Thyroidectomy, partial  09-05-11  . Elbow surgery  09-05-11    left elbow -ligament repair  . Umbilical hernia repair  yrs ago  . Colon resection  09/07/2011    Procedure: LAPAROSCOPIC  SIGMOID COLON RESECTION;  Surgeon: Mariella Saa, MD;  Location: [facility];  Service: General;  Laterality: N/A;  with proctoscopy  . Laparoscopy  09/07/2011    Procedure: LAPAROSCOPY DIAGNOSTIC;  Surgeon: Roselle Locus II;  Location: [facility];  Service: Gynecology;  Laterality: N/A;  . Salpingoophorectomy  09/07/2011    Procedure: SALPINGO OOPHERECTOMY;  Surgeon: Roselle Locus II;  Location: [facility];  Service: Gynecology;  Laterality: Right;  . Tubal ligation  1983  . Ventral hernia repair  06/20/2012    Procedure: LAPAROSCOPIC VENTRAL HERNIA;  Surgeon: Mariella Saa, MD;  Location: [facility];  Service: General;  Laterality: N/A;  Laparoscopic Repair of Ventral Hernia with mesh  . Ventral hernia repair N/A 12/13/2013    Procedure: LAPAROSCOPIC REPAIR RECURRENT VENTRAL INCISIONAL  HERNIA;  Surgeon: Mariella Saa, MD;  Location: [facility];  Service: General;  Laterality: N/A;  . Insertion of mesh N/A 12/13/2013    Procedure: INSERTION OF MESH;  Surgeon: Mariella Saa, MD;  Location: [facility];  Service: General;  Laterality: N/A;    Allergies  Allergies  Allergen Reactions  . Aggrenox [Aspirin-Dipyridamole Er] Nausea And Vomiting and Other (See Comments)    Headache   . Dilaudid [Hydromorphone Hcl] Shortness Of Breath and Nausea And Vomiting    Able to take morphine without issue  . Shellfish Allergy Shortness Of Breath  and Rash    Only shrimp allergy  . Hydrocodone Nausea And Vomiting    Can take morphine without issue  . Cephalexin Itching and Rash  . Codeine Phosphate Nausea And Vomiting    REACTION: unspecified  . Meperidine Hcl Nausea And Vomiting  . Percocet [Oxycodone-Acetaminophen] Nausea And Vomiting    Can take morphine without issue    HPI  69 year old female with the above complex past medical history. She has a history of chest pain status post normal catheterization in 2011. She also has a prior history of left medullary stroke with some resultant loss of  memory and balance issues. Over the past several months, she's been experiencing intermittent rest and exertional substernal chest pressure associated with dyspnea. Symptoms vary in duration and generally resolve spontaneously. There are no specific provocative factors. She recently saw Dr. Jens Som on August 18 and subsequently underwent stress testing which revealed inferolateral ischemia with normal LV function. Since then, she is continued to have intermittent chest discomfort several times per day. She presents today to discuss and schedule diagnostic catheterization. She denies PND, orthopnea, syncope, edema, or early satiety.  Home Medications  Prior to Admission medications   Medication Sig Start Date End Date Taking? Authorizing Provider  atorvastatin (LIPITOR) 40 MG tablet TAKE 1 TABLET BY MOUTH EVERY DAY 05/06/15  Yes Gordy Savers, MD  butalbital-acetaminophen-caffeine (FIORICET, ESGIC) [phone] MG per tablet TAKE 1 TO 2 TABLETS BY MOUTH EVERY 6 HOURS AS NEEDED FOR HEADACHE 04/10/15  Yes Gordy Savers, MD  clopidogrel (PLAVIX) 75 MG tablet Take 1 tablet (75 mg total) by mouth every evening. 06/18/14  Yes Gordy Savers, MD  diclofenac (VOLTAREN) 75 MG EC tablet TAKE 1 TABLET BY MOUTH TWICE A DAY 11/13/14  Yes Gordy Savers, MD  levothyroxine (SYNTHROID, LEVOTHROID) 125 MCG tablet TAKE 1 TABLET BY MOUTH DAILY BEFORE BREAKFAST. 04/22/15  Yes Gordy Savers, MD  tetrahydrozoline 0.05 % ophthalmic solution Place 2 drops into both eyes as needed. Red eyes   Yes Historical Provider, MD  traMADol (ULTRAM) 50 MG tablet TAKE 1 TABLET BY MOUTH EVERY 6 HOURS AS NEEDED FOR PAIN 03/30/15  Yes Gordy Savers, MD  verapamil (VERELAN PM) 360 MG 24 hr capsule TAKE 1 CAPSULE BY MOUTH AT BEDTIME. 05/22/15  Yes Gordy Savers, MD  aspirin EC 81 MG tablet Take 1 tablet (81 mg total) by mouth daily. 05/27/15   Ok Anis, NP    Family History  Family History  Problem  Relation Age of Onset  . Arrhythmia Mother   . Hypertension Mother   . Stroke Mother   . Stroke Maternal Grandmother   . Diabetes Paternal Grandmother     Social History  Social History   Social History  . Marital Status: Married    Spouse Name: N/A  . Number of Children: N/A  . Years of Education: N/A   Occupational History  . Not on file.   Social History Main Topics  . Smoking status: Former Smoker -- 0.50 packs/day for 5 years    Types: Cigarettes    Quit date: 09/19/1968  . Smokeless tobacco: Never Used  . Alcohol Use: 0.0 oz/week    0 Standard drinks or equivalent per week     Comment: previously a moderate drinker but none in several  years.  . Drug Use: No  . Sexual Activity: Yes   Other Topics Concern  . Not on file   Social History Narrative  Lives in [place] with her husband.  She does not routinely exercise.     Review of Systems  General:  No chills, fever, night sweats or weight changes.  Cardiovascular:  Positive chest pain, positive dyspnea on exertion, no edema, orthopnea, palpitations, paroxysmal nocturnal dyspnea. Dermatological: No rash, lesions/masses Respiratory: No cough, dyspnea Urologic: No hematuria, dysuria Abdominal:   No nausea, vomiting, diarrhea, bright red blood per rectum, melena, or hematemesis Neurologic:  She does have some balance issues and is scheduled to follow-up with neurology next week. No visual changes, wkns, changes in mental status. All other systems reviewed and are otherwise negative except as noted above.  Physical Exam  VS:  BP 148/84 mmHg  Pulse 60  Ht  (1.676 m)  Wt 188 lb 12.8 oz (85.639 kg)  BMI 30.49 kg/m2 , BMI Body mass index is 30.49 kg/(m^2). GEN: Well nourished, well developed, in no acute distress. HEENT: normal. Neck: Supple, no JVD, carotid bruits, or masses. Cardiac: RRR, no murmurs, rubs, or gallops. No clubbing, cyanosis, edema.  Radials/DP/PT 2+ and equal bilaterally.  Respiratory:   Respirations regular and unlabored, clear to auscultation bilaterally. GI: Soft, nontender, nondistended, BS + x 4. MS: no deformity or atrophy. Skin: warm and dry, no rash. Neuro:  Strength and sensation are intact. Psych: Normal affect.  Accessory Clinical Findings  Labs pending  Assessment & Plan  1.  Unstable angina: Patient has a several month history of progressive rest and exertional substernal chest discomfort associated with dyspnea. There are no specific aggravating or alleviating factors. She underwent stress testing revealing inferolateral ischemia with normal LV function. She presents today to schedule diagnostic catheterization.  The patient understands that risks include but are not limited to stroke (1 in 1000), death (1 in 1000), kidney failure [usually temporary] (1 in 500), bleeding (1 in 200), allergic reaction [possibly serious] (1 in 200), and agrees to proceed.  She has been scheduled for Monday of next week. She is on chronic Plavix therapy in the setting of prior stroke. I will add low-dose aspirin today. She remains on statin.  2. Essential hypertension: Blood pressure is elevated in clinic today though she reports that it has been normal on prior checks. I will not make changes to her antihypertensives regimen today which currently is only verapamil. Can follow-up during catheterization.  3. Hyperlipidemia: She remains on Lipitor therapy.  4. Disposition: Scheduled for diagnostic catheterization on Monday, September 12.  Nicolasa Ducking, NP 05/27/2015, 11:56 AM

## 2015-06-02 ENCOUNTER — Encounter (HOSPITAL_COMMUNITY): Payer: Self-pay | Admitting: Cardiovascular Disease

## 2015-06-03 ENCOUNTER — Encounter: Payer: Self-pay | Admitting: Neurology

## 2015-06-03 ENCOUNTER — Ambulatory Visit (INDEPENDENT_AMBULATORY_CARE_PROVIDER_SITE_OTHER): Payer: Medicare PPO | Admitting: Neurology

## 2015-06-03 VITALS — BP 171/97 | HR 59 | Ht 65.0 in | Wt 187.0 lb

## 2015-06-03 DIAGNOSIS — R42 Dizziness and giddiness: Secondary | ICD-10-CM | POA: Diagnosis not present

## 2015-06-03 DIAGNOSIS — G8190 Hemiplegia, unspecified affecting unspecified side: Secondary | ICD-10-CM | POA: Diagnosis not present

## 2015-06-03 DIAGNOSIS — G467 Other lacunar syndromes: Secondary | ICD-10-CM | POA: Insufficient documentation

## 2015-06-03 DIAGNOSIS — I679 Cerebrovascular disease, unspecified: Secondary | ICD-10-CM

## 2015-06-03 DIAGNOSIS — I639 Cerebral infarction, unspecified: Secondary | ICD-10-CM | POA: Diagnosis not present

## 2015-06-03 DIAGNOSIS — I6381 Other cerebral infarction due to occlusion or stenosis of small artery: Secondary | ICD-10-CM

## 2015-06-03 NOTE — Patient Instructions (Addendum)
I had a long d/w patient and husband about her recent stroke, risk for recurrent stroke/TIAs, personally independently reviewed imaging studies and stroke evaluation results and answered questions.Continue Plavix  for secondary stroke prevention and maintain strict control of hypertension with blood pressure goal below 130/90, diabetes with hemoglobin A1c goal below 6.5% and lipids with LDL cholesterol goal below 100 mg/dL. Plan to check MRA of the brain and neck to look for significant occlusive intra-or extracranial stenosis I also advised the patient to eat a healthy diet with plenty of whole grains, cereals, fruits and vegetables, exercise regularly and maintain ideal body weight Followup in the future with me in  2 months or call earlier if necessary Stroke Prevention Some medical conditions and behaviors are associated with an increased chance of having a stroke. You may prevent a stroke by making healthy choices and managing medical conditions. HOW CAN I REDUCE MY RISK OF HAVING A STROKE?   Stay physically active. Get at least 30 minutes of activity on most or all days.  Do not smoke. It may also be helpful to avoid exposure to secondhand smoke.  Limit alcohol use. Moderate alcohol use is considered to be:  No more than 2 drinks per day for men.  No more than 1 drink per day for nonpregnant women.  Eat healthy foods. This involves:  Eating 5 or more servings of fruits and vegetables a day.  Making dietary changes that address high blood pressure (hypertension), high cholesterol, diabetes, or obesity.  Manage your cholesterol levels.  Making food choices that are high in fiber and low in saturated fat, trans fat, and cholesterol may control cholesterol levels.  Take any prescribed medicines to control cholesterol as directed by your health care provider.  Manage your diabetes.  Controlling your carbohydrate and sugar intake is recommended to manage diabetes.  Take any prescribed  medicines to control diabetes as directed by your health care provider.  Control your hypertension.  Making food choices that are low in salt (sodium), saturated fat, trans fat, and cholesterol is recommended to manage hypertension.  Take any prescribed medicines to control hypertension as directed by your health care provider.  Maintain a healthy weight.  Reducing calorie intake and making food choices that are low in sodium, saturated fat, trans fat, and cholesterol are recommended to manage weight.  Stop drug abuse.  Avoid taking birth control pills.  Talk to your health care provider about the risks of taking birth control pills if you are over 10 years old, smoke, get migraines, or have ever had a blood clot.  Get evaluated for sleep disorders (sleep apnea).  Talk to your health care provider about getting a sleep evaluation if you snore a lot or have excessive sleepiness.  Take medicines only as directed by your health care provider.  For some people, aspirin or blood thinners (anticoagulants) are helpful in reducing the risk of forming abnormal blood clots that can lead to stroke. If you have the irregular heart rhythm of atrial fibrillation, you should be on a blood thinner unless there is a good reason you cannot take them.  Understand all your medicine instructions.  Make sure that other conditions (such as anemia or atherosclerosis) are addressed. SEEK IMMEDIATE MEDICAL CARE IF:   You have sudden weakness or numbness of the face, arm, or leg, especially on one side of the body.  Your face or eyelid droops to one side.  You have sudden confusion.  You have trouble speaking (aphasia) or  understanding.  You have sudden trouble seeing in one or both eyes.  You have sudden trouble walking.  You have dizziness.  You have a loss of balance or coordination.  You have a sudden, severe headache with no known cause.  You have new chest pain or an irregular  heartbeat. Any of these symptoms may represent a serious problem that is an emergency. Do not wait to see if the symptoms will go away. Get medical help at once. Call your local emergency services (911 in [place] Do not drive yourself to the hospital. Document Released: 10/13/2004 Document Revised: 01/20/2014 Document Reviewed: 03/08/2013 [facility] Patient Information 2015 [place], [place]. This information is not intended to replace advice given to you by your health care provider. Make sure you discuss any questions you have with your health care provider. nd husband about her recent stroke, risk for recurrent stroke/TIAs, personally independently reviewed imaging studies and stroke evaluation results and answered questions.Continue Plavix  for secondary stroke prevention and maintain strict control of hypertension with blood pressure goal below 130/90, diabetes with hemoglobin A1c goal below 6.5% and lipids with LDL cholesterol goal below 100 mg/dL. I also advised the patient to eat a healthy diet with plenty of whole grains, cereals, fruits and vegetables, exercise regularly and maintain ideal body weight .check fasting lipid profile hemoglobin A1c, MRA of the brain and neck. I offered the patient referral to physical therapy for gait and balance training but she is declining. I counseled the patient to be compliant with her blood pressure medications and seek regular follow-up with her primary physician to regulate her blood pressure. Followup in the future with me in 2 months or call earlier if necessary.

## 2015-06-04 DIAGNOSIS — R42 Dizziness and giddiness: Secondary | ICD-10-CM | POA: Insufficient documentation

## 2015-06-04 DIAGNOSIS — I6381 Other cerebral infarction due to occlusion or stenosis of small artery: Secondary | ICD-10-CM | POA: Insufficient documentation

## 2015-06-04 LAB — LIPID PANEL
CHOLESTEROL TOTAL: 179 mg/dL (ref 100–199)
Chol/HDL Ratio: 2.3 ratio units (ref 0.0–4.4)
HDL: 77 mg/dL (ref >39)
LDL Calculated: 84 mg/dL (ref 0–99)
TRIGLYCERIDES: 92 mg/dL (ref 0–149)
VLDL Cholesterol Cal: 18 mg/dL (ref 5–40)

## 2015-06-04 LAB — HEMOGLOBIN A1C
Est. average glucose Bld gHb Est-mCnc: 126 mg/dL
Hgb A1c MFr Bld: 6 % — ABNORMAL HIGH (ref 4.8–5.6)

## 2015-06-04 NOTE — Progress Notes (Signed)
[facility] [address] [place]. [place] [address] [phone]       OFFICE CONSULT NOTE  Abigail. Abigail [name] Date of Birth:  1945-10-08 Medical Record Number:  [id number]   Referring MD:  Beverely Low  Reason for Referral:  Dizziness and abnormal MRI brain  HPI: Abigail [name] is a 37 year pleasant lady whose had five-month history of gait and balance difficulties. She just feels she often leans to the left and stumbles if she is not careful. She feels the symptoms began suddenly one day when she noticed trouble with balance and walking she also had some trouble swallowing but she did not seek immediate help at that time. She does have a prior history of her left thalamic lacunar infarct in 2007 for which she had seen me. That was felt to be due to small vessel disease and she was started on Aggrenox which did not tolerate due to side effects. She was lost to medical follow-up to me. She is recently had an outpatient MRI scan of the brain which have personally reviewed on 05/02/15 which does show a right pontine and cerebellar hyperintensity which is likely a age indeterminate lacunar infarcts which were not noticed on the previous MRI and perhaps may be the explanation of her recent dizziness and gait difficulties. The patient is currently on Plavix which is tolerating well without bleeding or bruising. She states her blood pressure has been difficult to control and today it is elevated at 171/97 in office. She did also had some recent chest pain or shortness of breath for which she underwent cardiac catheterization last Monday which did not show any correctable lesion. Dr. Jens Som for cardiologist has also added a baby aspirin and she seems to be having increased bruising tendency since then. She's not sure when her last lipid profile was checked but she does take Lipitor 40 mg daily which is tolerating well without side effects. She does have history of sleep apnea and had surgery for  that and does not use CPAP  ROS:   14 system review of systems is positive for hearing loss, spinning sensation, dizziness, imbalance, shortness of breath, chest pain, easy bruising, memory loss, headache and all other systems negative  PMH:  Past Medical History  Diagnosis Date  . Hyperlipidemia   . History of colonic polyps   . Late effect of adverse effect of drug, medicinal or biological substance   . History of tension headache   . Heart palpitations   . Diverticulitis 09-05-11    hx. gastritis, diverticulitis x2 -now surgery planned  . Patent foramen ovale     Small - unable to be closed -   . Essential hypertension 09-05-11    tx. Verapamil  . Hypothyroidism 09-05-11    Supplement used  . Degenerative joint disease of cervical spine 09-05-11    Cervival area, now some osteoarthritis-lower back and Rt. shoulder  . Osteoarthritis 112-17-12    spine and rt. hip, rt. shoulder  . Sleep apnea 09-05-11    no cpap ever, had surgery to remove cartilage, no problems now  . Stroke 09-05-11    2006/2009-(loss of memory, balance issues remains occ.)  . PONV (postoperative nausea and vomiting)   . Headache(784.0)   . Bruises easily   . DDD (degenerative disc disease)   . DIVERTICULITIS, ACUTE 02/10/2010  . URI 09/02/2008  . TIA 09/28/2007  . SORE THROAT 04/30/2009  . NECK PAIN, ACUTE 09/21/2010  . GROIN PAIN 09/21/2010  .  Unstable angina     a. 05/2010 Cath: nl cors, EF 55%;  b. 05/2015 Lexiscan MV: small, severe, fixed apical defect and a small, severe, reversible inf lateral defect w/ apical thinning and mild ischemia, EF 54%.    Social History:  Social History   Social History  . Marital Status: Married    Spouse Name: N/A  . Number of Children: N/A  . Years of Education: N/A   Occupational History  . Not on file.   Social History Main Topics  . Smoking status: Former Smoker -- 0.50 packs/day for 5 years    Types: Cigarettes    Quit date: 09/19/1968  . Smokeless tobacco:  Never Used  . Alcohol Use: 0.0 oz/week    0 Standard drinks or equivalent per week     Comment: previously a moderate drinker but none in several  years.  . Drug Use: No  . Sexual Activity: Yes   Other Topics Concern  . Not on file   Social History Narrative   Lives in [place] with her husband.  She does not routinely exercise.    Medications:   Current Outpatient Prescriptions on File Prior to Visit  Medication Sig Dispense Refill  . atorvastatin (LIPITOR) 40 MG tablet TAKE 1 TABLET BY MOUTH EVERY DAY (Patient taking differently: TAKE 40 MG BY MOUTH EVERY DAY) 90 tablet 1  . butalbital-acetaminophen-caffeine (FIORICET, ESGIC) 50-325-40 MG per tablet TAKE 1 TO 2 TABLETS BY MOUTH EVERY 6 HOURS AS NEEDED FOR HEADACHE 120 tablet 1  . clopidogrel (PLAVIX) 75 MG tablet Take 1 tablet (75 mg total) by mouth every evening. 90 tablet 1  . diclofenac (VOLTAREN) 75 MG EC tablet TAKE 1 TABLET BY MOUTH TWICE A DAY (Patient taking differently: TAKE 75 MG BY MOUTH TWICE A DAY) 180 tablet 1  . levothyroxine (SYNTHROID, LEVOTHROID) 125 MCG tablet TAKE 1 TABLET BY MOUTH DAILY BEFORE BREAKFAST. (Patient taking differently: TAKE 125 MCG BY MOUTH DAILY BEFORE BREAKFAST.) 90 tablet 3  . tetrahydrozoline 0.05 % ophthalmic solution Place 2 drops into both eyes as needed (for red eyes).     . traMADol (ULTRAM) 50 MG tablet TAKE 1 TABLET BY MOUTH EVERY 6 HOURS AS NEEDED FOR PAIN (Patient taking differently: TAKE 50 MG BY MOUTH EVERY 6 HOURS AS NEEDED FOR PAIN) 90 tablet 2  . verapamil (VERELAN PM) 360 MG 24 hr capsule TAKE 1 CAPSULE BY MOUTH AT BEDTIME. (Patient taking differently: TAKE 360 MG BY MOUTH AT BEDTIME.) 90 capsule 1   No current facility-administered medications on file prior to visit.    Allergies:   Allergies  Allergen Reactions  . Aggrenox [Aspirin-Dipyridamole Er] Nausea And Vomiting and Other (See Comments)    Headache   . Dilaudid [Hydromorphone Hcl] Shortness Of Breath, Nausea And Vomiting  and Other (See Comments)    Able to take morphine without issue  . Shellfish Allergy Shortness Of Breath and Rash    Only shrimp allergy  . Hydrocodone Nausea And Vomiting and Other (See Comments)    Can take morphine without issue  . Cephalexin Itching and Rash  . Codeine Phosphate Nausea And Vomiting and Other (See Comments)    REACTION: unspecified  . Contrast Media [Iodinated Diagnostic Agents] Other (See Comments)    Makes mouth water  . Meperidine Hcl Nausea And Vomiting  . Percocet [Oxycodone-Acetaminophen] Nausea And Vomiting and Other (See Comments)    Can take morphine without issue    Physical Exam General: well developed, well nourished, seated,  in no evident distress Head: head normocephalic and atraumatic.   Neck: supple with no carotid or supraclavicular bruits Cardiovascular: regular rate and rhythm, no murmurs Musculoskeletal: no deformity Skin:  no rash/petichiae Vascular:  Normal pulses all extremities  Neurologic Exam Mental Status: Awake and fully alert. Oriented to place and time. Recent and remote memory intact. Attention span, concentration and fund of knowledge appropriate. Mood and affect appropriate.  Cranial Nerves: Fundoscopic exam reveals sharp disc margins. Pupils equal, briskly reactive to light. Extraocular movements full without nystagmus. Visual fields full to confrontation. Hearing intact. Facial sensation intact. Face, tongue, palate moves normally and symmetrically.  Motor: Normal bulk and tone. Normal strength in all tested extremity muscles. Sensory.: intact to touch , pinprick , position and vibratory sensation.  Coordination: Rapid alternating movements normal in all extremities. Finger-to-nose and heel-to-shin performed accurately bilaterally. Gait and Station: Arises from chair without difficulty. Stance is slightly broad-based Gait demonstrates normal stride length and balance . Unable to heel, toe and tandem walk without difficulty. Patient  tends to have retropulsion while standing on a narrow base Reflexes: 1+ and symmetric. Toes downgoing.   NIHSS  0 Modified Rankin  2   ASSESSMENT: 29 year Caucasian lady with five-month history of gait and balance difficulties likely due to right pontine and cerebellar infarcts etiology likely small vessel disease. Multiple vascular risk factors of hypertension, hyperlipidemia, h/o sleep apnoea  .    PLAN: I had a long d/w patient and husband about her recent stroke, risk for recurrent stroke/TIAs, personally independently reviewed imaging studies and stroke evaluation results and answered questions.Continue Plavix  for secondary stroke prevention and maintain strict control of hypertension with blood pressure goal below 130/90, diabetes with hemoglobin A1c goal below 6.5% and lipids with LDL cholesterol goal below 100 mg/dL. Check MRA of the brain and neck to look for significant occlusive extra or intracranial disease. I also advised the patient to eat a healthy diet with plenty of whole grains, cereals, fruits and vegetables, exercise regularly and maintain ideal body weight Followup in the future with me in  2 months or call earlier if necessary  Delia Heady, MD Note: This document was prepared with digital dictation and possible smart phrase technology. Any transcriptional errors that result from this process are unintentional.

## 2015-06-05 ENCOUNTER — Ambulatory Visit (INDEPENDENT_AMBULATORY_CARE_PROVIDER_SITE_OTHER): Payer: Medicare PPO | Admitting: Internal Medicine

## 2015-06-05 ENCOUNTER — Encounter: Payer: Self-pay | Admitting: Internal Medicine

## 2015-06-05 VITALS — BP 158/100 | HR 61 | Temp 98.1°F | Resp 20 | Ht 65.0 in | Wt 187.0 lb

## 2015-06-05 DIAGNOSIS — I6381 Other cerebral infarction due to occlusion or stenosis of small artery: Secondary | ICD-10-CM

## 2015-06-05 DIAGNOSIS — Z23 Encounter for immunization: Secondary | ICD-10-CM

## 2015-06-05 DIAGNOSIS — I639 Cerebral infarction, unspecified: Secondary | ICD-10-CM

## 2015-06-05 DIAGNOSIS — I10 Essential (primary) hypertension: Secondary | ICD-10-CM | POA: Diagnosis not present

## 2015-06-05 MED ORDER — LISINOPRIL 20 MG PO TABS: 20.0000 mg | ORAL_TABLET | Freq: Every day | ORAL | Status: DC

## 2015-06-05 NOTE — Progress Notes (Signed)
Pre visit review using our clinic review tool, if applicable. No additional management support is needed unless otherwise documented below in the visit note. 

## 2015-06-05 NOTE — Progress Notes (Signed)
Subjective:    Patient ID: Abigail [name], female    DOB: 09/19/1946, 69 y.o.   MRN: [id number]  HPI  BP Readings from Last 3 Encounters:  06/05/15 158/100  06/03/15 171/97  06/01/15 65/84   69 year old patient who has essential hypertension.  She was evaluated by neurology recently due to a left-sided lacunar hypertensive stroke.  Brain MRI has been scheduled.  Doing well today except for her chronic muscle contraction headaches.  She has had a recent heart catheterization with normal results.  She complains of elevated blood pressure readings  Past Medical History  Diagnosis Date  . Hyperlipidemia   . History of colonic polyps   . Late effect of adverse effect of drug, medicinal or biological substance   . History of tension headache   . Heart palpitations   . Diverticulitis 09-05-11    hx. gastritis, diverticulitis x2 -now surgery planned  . Patent foramen ovale     Small - unable to be closed -   . Essential hypertension 09-05-11    tx. Verapamil  . Hypothyroidism 09-05-11    Supplement used  . Degenerative joint disease of cervical spine 09-05-11    Cervival area, now some osteoarthritis-lower back and Rt. shoulder  . Osteoarthritis 112-17-12    spine and rt. hip, rt. shoulder  . Sleep apnea 09-05-11    no cpap ever, had surgery to remove cartilage, no problems now  . Stroke 09-05-11    2006/2009-(loss of memory, balance issues remains occ.)  . PONV (postoperative nausea and vomiting)   . Headache(784.0)   . Bruises easily   . DDD (degenerative disc disease)   . DIVERTICULITIS, ACUTE 02/10/2010  . URI 09/02/2008  . TIA 09/28/2007  . SORE THROAT 04/30/2009  . NECK PAIN, ACUTE 09/21/2010  . GROIN PAIN 09/21/2010  . Unstable angina     a. 05/2010 Cath: nl cors, EF 55%;  b. 05/2015 Lexiscan MV: small, severe, fixed apical defect and a small, severe, reversible inf lateral defect w/ apical thinning and mild ischemia, EF 54%.    Social History   Social History  . Marital  Status: Married    Spouse Name: N/A  . Number of Children: N/A  . Years of Education: N/A   Occupational History  . Not on file.   Social History Main Topics  . Smoking status: Former Smoker -- 0.50 packs/day for 5 years    Types: Cigarettes    Quit date: 09/19/1968  . Smokeless tobacco: Never Used  . Alcohol Use: 0.0 oz/week    0 Standard drinks or equivalent per week     Comment: previously a moderate drinker but none in several  years.  . Drug Use: No  . Sexual Activity: Yes   Other Topics Concern  . Not on file   Social History Narrative   Lives in [place] with her husband.  She does not routinely exercise.    Past Surgical History  Procedure Laterality Date  . Abdominal hysterectomy    . Thyroidectomy, partial  09-05-11  . Elbow surgery  09-05-11    left elbow -ligament repair  . Umbilical hernia repair  yrs ago  . Colon resection  09/07/2011    Procedure: LAPAROSCOPIC SIGMOID COLON RESECTION;  Surgeon: Mariella Saa, MD;  Location: [facility];  Service: General;  Laterality: N/A;  with proctoscopy  . Laparoscopy  09/07/2011    Procedure: LAPAROSCOPY DIAGNOSTIC;  Surgeon: Roselle Locus II;  Location: [facility];  Service: Gynecology;  Laterality: N/A;  . Salpingoophorectomy  09/07/2011    Procedure: SALPINGO OOPHERECTOMY;  Surgeon: Roselle Locus II;  Location: [facility];  Service: Gynecology;  Laterality: Right;  . Tubal ligation  1983  . Ventral hernia repair  06/20/2012    Procedure: LAPAROSCOPIC VENTRAL HERNIA;  Surgeon: Mariella Saa, MD;  Location: [facility];  Service: General;  Laterality: N/A;  Laparoscopic Repair of Ventral Hernia with mesh  . Ventral hernia repair N/A 12/13/2013    Procedure: LAPAROSCOPIC REPAIR RECURRENT VENTRAL INCISIONAL  HERNIA;  Surgeon: Mariella Saa, MD;  Location: [facility];  Service: General;  Laterality: N/A;  . Insertion of mesh N/A 12/13/2013    Procedure: INSERTION OF MESH;  Surgeon: Mariella Saa, MD;  Location: [facility];   Service: General;  Laterality: N/A;  . Cardiac catheterization N/A 06/01/2015    Procedure: Left Heart Cath and Coronary Angiography;  Surgeon: Kathleene Hazel, MD;  Location: [facility] INVASIVE CV LAB;  Service: Cardiovascular;  Laterality: N/A;    Family History  Problem Relation Age of Onset  . Arrhythmia Mother   . Hypertension Mother   . Stroke Mother   . Stroke Maternal Grandmother   . Diabetes Paternal Grandmother     Allergies  Allergen Reactions  . Aggrenox [Aspirin-Dipyridamole Er] Nausea And Vomiting and Other (See Comments)    Headache   . Dilaudid [Hydromorphone Hcl] Shortness Of Breath, Nausea And Vomiting and Other (See Comments)    Able to take morphine without issue  . Shellfish Allergy Shortness Of Breath and Rash    Only shrimp allergy  . Hydrocodone Nausea And Vomiting and Other (See Comments)    Can take morphine without issue  . Cephalexin Itching and Rash  . Codeine Phosphate Nausea And Vomiting and Other (See Comments)    REACTION: unspecified  . Contrast Media [Iodinated Diagnostic Agents] Other (See Comments)    Makes mouth water  . Meperidine Hcl Nausea And Vomiting  . Percocet [Oxycodone-Acetaminophen] Nausea And Vomiting and Other (See Comments)    Can take morphine without issue    Current Outpatient Prescriptions on File Prior to Visit  Medication Sig Dispense Refill  . atorvastatin (LIPITOR) 40 MG tablet TAKE 1 TABLET BY MOUTH EVERY DAY (Patient taking differently: TAKE 40 MG BY MOUTH EVERY DAY) 90 tablet 1  . butalbital-acetaminophen-caffeine (FIORICET, ESGIC) 50-325-40 MG per tablet TAKE 1 TO 2 TABLETS BY MOUTH EVERY 6 HOURS AS NEEDED FOR HEADACHE 120 tablet 1  . clopidogrel (PLAVIX) 75 MG tablet Take 1 tablet (75 mg total) by mouth every evening. 90 tablet 1  . diclofenac (VOLTAREN) 75 MG EC tablet TAKE 1 TABLET BY MOUTH TWICE A DAY (Patient taking differently: TAKE 75 MG BY MOUTH TWICE A DAY) 180 tablet 1  . levothyroxine (SYNTHROID,  LEVOTHROID) 125 MCG tablet TAKE 1 TABLET BY MOUTH DAILY BEFORE BREAKFAST. (Patient taking differently: TAKE 125 MCG BY MOUTH DAILY BEFORE BREAKFAST.) 90 tablet 3  . tetrahydrozoline 0.05 % ophthalmic solution Place 2 drops into both eyes as needed (for red eyes).     . traMADol (ULTRAM) 50 MG tablet TAKE 1 TABLET BY MOUTH EVERY 6 HOURS AS NEEDED FOR PAIN (Patient taking differently: TAKE 50 MG BY MOUTH EVERY 6 HOURS AS NEEDED FOR PAIN) 90 tablet 2  . verapamil (VERELAN PM) 360 MG 24 hr capsule TAKE 1 CAPSULE BY MOUTH AT BEDTIME. (Patient taking differently: TAKE 360 MG BY MOUTH AT BEDTIME.) 90 capsule 1   No current facility-administered medications on  file prior to visit.    BP 158/100 mmHg  Pulse 61  Temp(Src) 98.1 F (36.7 C) (Oral)  Resp 20  Ht 5\' 5"  (1.651 m)  Wt 187 lb (84.823 kg)  BMI 31.12 kg/m2  SpO2 98%      Review of Systems  Constitutional: Positive for fatigue.  Neurological: Positive for light-headedness and headaches.       Objective:   Physical Exam  Constitutional:  Blood pressure on arrival 158 over 100 Lowest blood pressure 140/90  Neurological:  Impaired finger to nose testing on the left          Assessment & Plan:   Hypertensive History of lacunar stroke  Will add lisinopril 20 Low-salt diet recommended Home blood pressure monitoring.  Encouraged  Recheck 2 weeks Brain MRA as scheduled

## 2015-06-05 NOTE — Patient Instructions (Signed)
Limit your sodium (Salt) intake    It is important that you exercise regularly, at least 20 minutes 3 to 4 times per week.  If you develop chest pain or shortness of breath seek  medical attention.  Please check your blood pressure on a regular basis.  If it is consistently greater than 150/90, please make an office appointment.  Return in 2 weeks for follow-up

## 2015-06-10 ENCOUNTER — Other Ambulatory Visit: Payer: Self-pay | Admitting: Internal Medicine

## 2015-06-11 ENCOUNTER — Encounter: Payer: Self-pay | Admitting: Cardiology

## 2015-06-11 ENCOUNTER — Telehealth: Payer: Self-pay

## 2015-06-11 ENCOUNTER — Ambulatory Visit (INDEPENDENT_AMBULATORY_CARE_PROVIDER_SITE_OTHER): Payer: Medicare PPO | Admitting: Cardiology

## 2015-06-11 VITALS — BP 144/80 | HR 60 | Ht 65.5 in | Wt 186.8 lb

## 2015-06-11 DIAGNOSIS — R072 Precordial pain: Secondary | ICD-10-CM | POA: Diagnosis not present

## 2015-06-11 DIAGNOSIS — I10 Essential (primary) hypertension: Secondary | ICD-10-CM | POA: Diagnosis not present

## 2015-06-11 DIAGNOSIS — E785 Hyperlipidemia, unspecified: Secondary | ICD-10-CM

## 2015-06-11 MED ORDER — VERAPAMIL HCL ER 240 MG PO CP24: 240.0000 mg | ORAL_CAPSULE | Freq: Every day | ORAL | Status: DC

## 2015-06-11 NOTE — Telephone Encounter (Signed)
Rn call patient to inform her that her cholesterol and blood test for diabetes was borderline but satisfactory. Rn explain to patient if her provider is a part of [facility] they can see in epic. Pt stated her PCP is trying to regulate her blood pressure and she will be seeing him in 2 weeks. Pt will let her PCP know about the test. Pt understands the lab results.

## 2015-06-11 NOTE — Telephone Encounter (Signed)
-----   Message from Micki Riley, MD sent at 06/08/2015  5:19 PM EDT ----- Abigail [name] inform the patient that both cholesterol and blood test for diabetes are borderline but satisfactory

## 2015-06-11 NOTE — Progress Notes (Signed)
Cardiology Office Note   Date:  06/11/2015   ID:  Abigail [name], DOB 11-21-1945, MRN [id number]  PCP:  Rogelia Boga, MD  Cardiologist:  Dr. Jens Som    Chief Complaint  Patient presents with  . Shortness of Breath    OCC AT REST AND EXERTION  . Chest Pain    DAILY, UNCOMFORTABLE FEELING/OCC ANYTIME      History of Present Illness: Abigail [name] is a 69 y.o. female who presents for post cath follow up.  She had been with chest pain and dyspnea but cardiac cath with normal cors only 10% stenosis.  She continues with dyspnea at any time.  Today on EKG done for chest pain, her HR is 50. Possible the brady is causing some of her symptoms.    She is scheduled for a MRA with recent stroke.  CXR in May this year was stable. She has hx of positive transcranial dopplers for PFO.    On echo only small PFO EF 55-60%. Dr. Jacinto Halim was unable to close due to the size.    She denies indigestion.      Past Medical History  Diagnosis Date  . Hyperlipidemia   . History of colonic polyps   . Late effect of adverse effect of drug, medicinal or biological substance   . History of tension headache   . Heart palpitations   . Diverticulitis 09-05-11    hx. gastritis, diverticulitis x2 -now surgery planned  . Patent foramen ovale     Small - unable to be closed -   . Essential hypertension 09-05-11    tx. Verapamil  . Hypothyroidism 09-05-11    Supplement used  . Degenerative joint disease of cervical spine 09-05-11    Cervival area, now some osteoarthritis-lower back and Rt. shoulder  . Osteoarthritis 112-17-12    spine and rt. hip, rt. shoulder  . Sleep apnea 09-05-11    no cpap ever, had surgery to remove cartilage, no problems now  . Stroke 09-05-11    2006/2009-(loss of memory, balance issues remains occ.)  . PONV (postoperative nausea and vomiting)   . Headache(784.0)   . Bruises easily   . DDD (degenerative disc disease)   . DIVERTICULITIS, ACUTE 02/10/2010  . URI 09/02/2008    . TIA 09/28/2007  . SORE THROAT 04/30/2009  . NECK PAIN, ACUTE 09/21/2010  . GROIN PAIN 09/21/2010  . Unstable angina     a. 05/2010 Cath: nl cors, EF 55%;  b. 05/2015 Lexiscan MV: small, severe, fixed apical defect and a small, severe, reversible inf lateral defect w/ apical thinning and mild ischemia, EF 54%.    Past Surgical History  Procedure Laterality Date  . Abdominal hysterectomy    . Thyroidectomy, partial  09-05-11  . Elbow surgery  09-05-11    left elbow -ligament repair  . Umbilical hernia repair  yrs ago  . Colon resection  09/07/2011    Procedure: LAPAROSCOPIC SIGMOID COLON RESECTION;  Surgeon: Mariella Saa, MD;  Location: [facility];  Service: General;  Laterality: N/A;  with proctoscopy  . Laparoscopy  09/07/2011    Procedure: LAPAROSCOPY DIAGNOSTIC;  Surgeon: Roselle Locus II;  Location: [facility];  Service: Gynecology;  Laterality: N/A;  . Salpingoophorectomy  09/07/2011    Procedure: SALPINGO OOPHERECTOMY;  Surgeon: Roselle Locus II;  Location: [facility];  Service: Gynecology;  Laterality: Right;  . Tubal ligation  1983  . Ventral hernia repair  06/20/2012    Procedure: LAPAROSCOPIC VENTRAL  HERNIA;  Surgeon: Mariella Saa, MD;  Location: [facility];  Service: General;  Laterality: N/A;  Laparoscopic Repair of Ventral Hernia with mesh  . Ventral hernia repair N/A 12/13/2013    Procedure: LAPAROSCOPIC REPAIR RECURRENT VENTRAL INCISIONAL  HERNIA;  Surgeon: Mariella Saa, MD;  Location: [facility];  Service: General;  Laterality: N/A;  . Insertion of mesh N/A 12/13/2013    Procedure: INSERTION OF MESH;  Surgeon: Mariella Saa, MD;  Location: [facility];  Service: General;  Laterality: N/A;  . Cardiac catheterization N/A 06/01/2015    Procedure: Left Heart Cath and Coronary Angiography;  Surgeon: Kathleene Hazel, MD;  Location: [facility];  Service: Cardiovascular;  Laterality: N/A;     Current Outpatient Prescriptions  Medication Sig Dispense Refill  .  atorvastatin (LIPITOR) 40 MG tablet TAKE 1 TABLET BY MOUTH EVERY DAY (Patient taking differently: TAKE 40 MG BY MOUTH EVERY DAY) 90 tablet 1  . butalbital-acetaminophen-caffeine (FIORICET, ESGIC) 50-325-40 MG per tablet TAKE 1 TO 2 TABLETS BY MOUTH EVERY 6 HOURS AS NEEDED FOR HEADACHE 120 tablet 1  . clopidogrel (PLAVIX) 75 MG tablet Take 1 tablet (75 mg total) by mouth every evening. 90 tablet 1  . diclofenac (VOLTAREN) 75 MG EC tablet TAKE 1 TABLET BY MOUTH TWICE A DAY (Patient taking differently: TAKE 75 MG BY MOUTH TWICE A DAY) 180 tablet 1  . levothyroxine (SYNTHROID, LEVOTHROID) 125 MCG tablet TAKE 1 TABLET BY MOUTH DAILY BEFORE BREAKFAST. (Patient taking differently: TAKE 125 MCG BY MOUTH DAILY BEFORE BREAKFAST.) 90 tablet 3  . lisinopril (PRINIVIL,ZESTRIL) 20 MG tablet Take 1 tablet (20 mg total) by mouth daily. 90 tablet 3  . tetrahydrozoline 0.05 % ophthalmic solution Place 2 drops into both eyes as needed (for red eyes).     . traMADol (ULTRAM) 50 MG tablet TAKE 1 TABLET BY MOUTH EVERY 6 HOURS AS NEEDED FOR PAIN (Patient taking differently: TAKE 50 MG BY MOUTH EVERY 6 HOURS AS NEEDED FOR PAIN) 90 tablet 2  . verapamil (VERELAN PM) 360 MG 24 hr capsule TAKE 1 CAPSULE BY MOUTH AT BEDTIME. (Patient taking differently: TAKE 360 MG BY MOUTH AT BEDTIME.) 90 capsule 1   No current facility-administered medications for this visit.    Allergies:   Aggrenox; Dilaudid; Shellfish allergy; Hydrocodone; Cephalexin; Codeine phosphate; Contrast media; Meperidine hcl; and Percocet    Social History:  The patient  reports that she quit smoking about 46 years ago. Her smoking use included Cigarettes. She has a 2.5 pack-year smoking history. She has never used smokeless tobacco. She reports that she drinks alcohol. She reports that she does not use illicit drugs.   Family History:  The patient's family history includes Arrhythmia in her mother; Diabetes in her paternal grandmother; Hypertension in her  mother; Stroke in her maternal grandmother and mother.    ROS:  General:no colds or fevers, no weight changes Skin:no rashes or ulcers HEENT:no blurred vision, no congestion CV:see HPI PUL:see HPI GI:no diarrhea constipation or melena, no indigestion GU:no hematuria, no dysuria MS:no joint pain, no claudication Neuro:no syncope, no lightheadedness- off balance having neuro workup Endo:no diabetes, no thyroid disease  Wt Readings from Last 3 Encounters:  06/11/15 186 lb 12.8 oz (84.732 kg)  06/05/15 187 lb (84.823 kg)  06/03/15 187 lb (84.823 kg)     PHYSICAL EXAM: VS:  BP 144/80 mmHg  Pulse 60  Ht 5' 5.5" (1.664 m)  Wt 186 lb 12.8 oz (84.732 kg)  BMI 30.60  kg/m2 , BMI Body mass index is 30.6 kg/(m^2). General:Pleasant affect but flat, NAD Skin:Warm and dry, brisk capillary refill HEENT:normocephalic, sclera clear, mucus membranes moist Neck:supple, no JVD, no bruits  Heart:S1S2 RRR without murmur, gallup, rub or click Lungs:clear without rales, rhonchi, or wheezes ZOX:WRUE, non tender, + BS, do not palpate liver spleen or masses Ext:no lower ext edema, 2+ pedal pulses, 2+ radial pulses Neuro:alert and oriented X 3, MAE, follows commands, + facial symmetry    EKG:  EKG is ordered today. The ekg ordered today demonstrates S Brady at 50 but no acute changes   Recent Labs: 05/27/2015: BUN 14; Creatinine, Ser 0.71; Hemoglobin 13.6; Platelets 167.0; Potassium 3.7; Sodium 141    Lipid Panel    Component Value Date/Time   CHOL 179 06/03/2015 1101   CHOL 151 03/05/2012 1048   TRIG 92 06/03/2015 1101   HDL 77 06/03/2015 1101   HDL 77.80 03/05/2012 1048   CHOLHDL 2.3 06/03/2015 1101   CHOLHDL 2 03/05/2012 1048   VLDL 12.4 03/05/2012 1048   LDLCALC 84 06/03/2015 1101   LDLCALC 61 03/05/2012 1048       Other studies Reviewed: Additional studies/ records that were reviewed today include: Echos, cath report.   ASSESSMENT AND PLAN:  1.  Chest pain continues along  with dyspnea at rest.  She denies indigestion.  HR is 50 today will decrease dose of verapamil to 240 mg to give her more HR that may help.  She will follow up with Dr. Jens Som in 2-3 weeks   2. Cardiac cath after positive nuc study with non obstructive disease, only 10% stenosis.  3. Normal EF on cath.  4. Sinus brady.  5. HTN controlled now on lisinopril.    6. hyperlipidemia  Continue statin.   Current medicines are reviewed with the patient today.  The patient Has no concerns regarding medicines.  The following changes have been made:  See above Labs/ tests ordered today include:see above  Disposition:   FU:  see above  Nyoka Lint, NP  06/11/2015 3:38 PM    [facility] [address] [place], [place], [place]  [address]/ [address] [place], [place] Phone: [phone]; Fax: [phone]  [phone]

## 2015-06-11 NOTE — Patient Instructions (Signed)
Your physician recommends that you schedule a follow-up appointment in: 3 WEEKS WITH DR CRENSHAW  DECREASE VERAPAMIL TO 240 MG ONCE DAILY

## 2015-06-13 ENCOUNTER — Other Ambulatory Visit: Payer: Self-pay | Admitting: Internal Medicine

## 2015-06-16 ENCOUNTER — Ambulatory Visit
Admission: RE | Admit: 2015-06-16 | Discharge: 2015-06-16 | Disposition: A | Discharge status: Home / Self Care | Payer: Medicare PPO | Source: Ambulatory Visit | Attending: Neurology | Admitting: Neurology

## 2015-06-16 DIAGNOSIS — G8190 Hemiplegia, unspecified affecting unspecified side: Secondary | ICD-10-CM | POA: Diagnosis not present

## 2015-06-16 DIAGNOSIS — I679 Cerebrovascular disease, unspecified: Secondary | ICD-10-CM

## 2015-06-16 DIAGNOSIS — G467 Other lacunar syndromes: Secondary | ICD-10-CM

## 2015-06-16 MED ORDER — GADOBENATE DIMEGLUMINE 529 MG/ML IV SOLN
17.0000 mL | Freq: Once | INTRAVENOUS | Status: AC | PRN
  Administered 2015-06-16: 17 mL via INTRAVENOUS

## 2015-06-18 ENCOUNTER — Encounter: Payer: Self-pay | Admitting: Internal Medicine

## 2015-06-18 ENCOUNTER — Ambulatory Visit (INDEPENDENT_AMBULATORY_CARE_PROVIDER_SITE_OTHER): Payer: Medicare PPO | Admitting: Internal Medicine

## 2015-06-18 VITALS — BP 150/90 | HR 57 | Temp 98.6°F | Resp 20 | Ht 65.5 in | Wt 190.0 lb

## 2015-06-18 DIAGNOSIS — Z8679 Personal history of other diseases of the circulatory system: Secondary | ICD-10-CM | POA: Diagnosis not present

## 2015-06-18 DIAGNOSIS — E039 Hypothyroidism, unspecified: Secondary | ICD-10-CM | POA: Diagnosis not present

## 2015-06-18 DIAGNOSIS — G467 Other lacunar syndromes: Secondary | ICD-10-CM

## 2015-06-18 DIAGNOSIS — Z23 Encounter for immunization: Secondary | ICD-10-CM

## 2015-06-18 DIAGNOSIS — G8190 Hemiplegia, unspecified affecting unspecified side: Secondary | ICD-10-CM

## 2015-06-18 DIAGNOSIS — I10 Essential (primary) hypertension: Secondary | ICD-10-CM | POA: Diagnosis not present

## 2015-06-18 MED ORDER — LISINOPRIL-HYDROCHLOROTHIAZIDE 20-12.5 MG PO TABS: 1.0000 | ORAL_TABLET | Freq: Every day | ORAL | Status: DC

## 2015-06-18 NOTE — Progress Notes (Signed)
Subjective:    Patient ID: Abigail [name], female    DOB: 31-Aug-1946, 69 y.o.   MRN: [id number]  HPI  69 year old patient who has a history of hypertension and a hypertensive lacunar stroke.  She still has some balance difficulties and impaired finger to nose testing on the left She was seen by cardiology recently and verapamil down titrated to 240 mg daily apparently for  concerns of bradycardia  Past Medical History  Diagnosis Date  . Hyperlipidemia   . History of colonic polyps   . Late effect of adverse effect of drug, medicinal or biological substance   . History of tension headache   . Heart palpitations   . Diverticulitis 09-05-11    hx. gastritis, diverticulitis x2 -now surgery planned  . Patent foramen ovale     Small - unable to be closed -   . Essential hypertension 09-05-11    tx. Verapamil  . Hypothyroidism 09-05-11    Supplement used  . Degenerative joint disease of cervical spine 09-05-11    Cervival area, now some osteoarthritis-lower back and Rt. shoulder  . Osteoarthritis 112-17-12    spine and rt. hip, rt. shoulder  . Sleep apnea 09-05-11    no cpap ever, had surgery to remove cartilage, no problems now  . Stroke 09-05-11    2006/2009-(loss of memory, balance issues remains occ.)  . PONV (postoperative nausea and vomiting)   . Headache(784.0)   . Bruises easily   . DDD (degenerative disc disease)   . DIVERTICULITIS, ACUTE 02/10/2010  . URI 09/02/2008  . TIA 09/28/2007  . SORE THROAT 04/30/2009  . NECK PAIN, ACUTE 09/21/2010  . GROIN PAIN 09/21/2010  . Unstable angina     a. 05/2010 Cath: nl cors, EF 55%;  b. 05/2015 Lexiscan MV: small, severe, fixed apical defect and a small, severe, reversible inf lateral defect w/ apical thinning and mild ischemia, EF 54%.    Social History   Social History  . Marital Status: Married    Spouse Name: N/A  . Number of Children: N/A  . Years of Education: N/A   Occupational History  . Not on file.   Social History Main  Topics  . Smoking status: Former Smoker -- 0.50 packs/day for 5 years    Types: Cigarettes    Quit date: 09/19/1968  . Smokeless tobacco: Never Used  . Alcohol Use: 0.0 oz/week    0 Standard drinks or equivalent per week     Comment: previously a moderate drinker but none in several  years.  . Drug Use: No  . Sexual Activity: Yes   Other Topics Concern  . Not on file   Social History Narrative   Lives in [place] with her husband.  She does not routinely exercise.    Past Surgical History  Procedure Laterality Date  . Abdominal hysterectomy    . Thyroidectomy, partial  09-05-11  . Elbow surgery  09-05-11    left elbow -ligament repair  . Umbilical hernia repair  yrs ago  . Colon resection  09/07/2011    Procedure: LAPAROSCOPIC SIGMOID COLON RESECTION;  Surgeon: Mariella Saa, MD;  Location: [facility];  Service: General;  Laterality: N/A;  with proctoscopy  . Laparoscopy  09/07/2011    Procedure: LAPAROSCOPY DIAGNOSTIC;  Surgeon: Roselle Locus II;  Location: [facility];  Service: Gynecology;  Laterality: N/A;  . Salpingoophorectomy  09/07/2011    Procedure: SALPINGO OOPHERECTOMY;  Surgeon: Roselle Locus II;  Location: [facility];  Service: Gynecology;  Laterality: Right;  . Tubal ligation  1983  . Ventral hernia repair  06/20/2012    Procedure: LAPAROSCOPIC VENTRAL HERNIA;  Surgeon: Mariella Saa, MD;  Location: [facility];  Service: General;  Laterality: N/A;  Laparoscopic Repair of Ventral Hernia with mesh  . Ventral hernia repair N/A 12/13/2013    Procedure: LAPAROSCOPIC REPAIR RECURRENT VENTRAL INCISIONAL  HERNIA;  Surgeon: Mariella Saa, MD;  Location: [facility];  Service: General;  Laterality: N/A;  . Insertion of mesh N/A 12/13/2013    Procedure: INSERTION OF MESH;  Surgeon: Mariella Saa, MD;  Location: [facility];  Service: General;  Laterality: N/A;  . Cardiac catheterization N/A 06/01/2015    Procedure: Left Heart Cath and Coronary Angiography;  Surgeon: Kathleene Hazel, MD;  Location: [facility];  Service: Cardiovascular;  Laterality: N/A;    Family History  Problem Relation Age of Onset  . Arrhythmia Mother   . Hypertension Mother   . Stroke Mother   . Stroke Maternal Grandmother   . Diabetes Paternal Grandmother     Allergies  Allergen Reactions  . Aggrenox [Aspirin-Dipyridamole Er] Nausea And Vomiting and Other (See Comments)    Headache   . Dilaudid [Hydromorphone Hcl] Shortness Of Breath, Nausea And Vomiting and Other (See Comments)    Able to take morphine without issue  . Shellfish Allergy Shortness Of Breath and Rash    Only shrimp allergy  . Hydrocodone Nausea And Vomiting and Other (See Comments)    Can take morphine without issue  . Cephalexin Itching and Rash  . Codeine Phosphate Nausea And Vomiting and Other (See Comments)    REACTION: unspecified  . Contrast Media [Iodinated Diagnostic Agents] Other (See Comments)    Makes mouth water  . Meperidine Hcl Nausea And Vomiting  . Percocet [Oxycodone-Acetaminophen] Nausea And Vomiting and Other (See Comments)    Can take morphine without issue    Current Outpatient Prescriptions on File Prior to Visit  Medication Sig Dispense Refill  . atorvastatin (LIPITOR) 40 MG tablet TAKE 1 TABLET BY MOUTH EVERY DAY (Patient taking differently: TAKE 40 MG BY MOUTH EVERY DAY) 90 tablet 1  . butalbital-acetaminophen-caffeine (FIORICET, ESGIC) 50-325-40 MG per tablet TAKE 1 TO 2 TABLETS BY MOUTH EVERY 6 HOURS AS NEEDED FOR HEADACHE 120 tablet 1  . clopidogrel (PLAVIX) 75 MG tablet Take 1 tablet (75 mg total) by mouth every evening. 90 tablet 1  . diclofenac (VOLTAREN) 75 MG EC tablet TAKE 1 TABLET BY MOUTH TWICE A DAY (Patient taking differently: TAKE 75 MG BY MOUTH TWICE A DAY) 180 tablet 1  . levothyroxine (SYNTHROID, LEVOTHROID) 125 MCG tablet TAKE 1 TABLET BY MOUTH DAILY BEFORE BREAKFAST. (Patient taking differently: TAKE 125 MCG BY MOUTH DAILY BEFORE BREAKFAST.) 90 tablet 3  .  lisinopril (PRINIVIL,ZESTRIL) 20 MG tablet Take 1 tablet (20 mg total) by mouth daily. 90 tablet 3  . tetrahydrozoline 0.05 % ophthalmic solution Place 2 drops into both eyes as needed (for red eyes).     . traMADol (ULTRAM) 50 MG tablet TAKE 1 TABLET BY MOUTH EVERY 6 HOURS AS NEEDED FOR PAIN (Patient taking differently: TAKE 50 MG BY MOUTH EVERY 6 HOURS AS NEEDED FOR PAIN) 90 tablet 2  . verapamil (VERELAN PM) 240 MG 24 hr capsule Take 1 capsule (240 mg total) by mouth at bedtime. 30 capsule 12   No current facility-administered medications on file prior to visit.    BP 150/90 mmHg  Pulse  57  Temp(Src) 98.6 F (37 C) (Oral)  Resp 20  Ht 5' 5.5" (1.664 m)  Wt 190 lb (86.183 kg)  BMI 31.13 kg/m2  SpO2 98%     Review of Systems  Constitutional: Positive for fatigue.  HENT: Negative for congestion, dental problem, hearing loss, rhinorrhea, sinus pressure, sore throat and tinnitus.   Eyes: Negative for pain, discharge and visual disturbance.  Respiratory: Negative for cough and shortness of breath.   Cardiovascular: Negative for chest pain, palpitations and leg swelling.  Gastrointestinal: Negative for nausea, vomiting, abdominal pain, diarrhea, constipation, blood in stool and abdominal distention.  Genitourinary: Negative for dysuria, urgency, frequency, hematuria, flank pain, vaginal bleeding, vaginal discharge, difficulty urinating, vaginal pain and pelvic pain.  Musculoskeletal: Positive for gait problem. Negative for joint swelling and arthralgias.  Skin: Negative for rash.  Neurological: Positive for weakness and headaches. Negative for dizziness, syncope, speech difficulty and numbness.  Hematological: Negative for adenopathy.  Psychiatric/Behavioral: Negative for behavioral problems, dysphoric mood and agitation. The patient is not nervous/anxious.        Objective:   Physical Exam  Constitutional:  Blood pressure 140/90  Neurological:  Impaired finger to nose testing  on the left          Assessment & Plan:  Hypertension.  In view of her hypertensive stroke.  Will attempt tighter blood pressure control.  Will add hydrochlorothiazide 12 point 5.  Home blood pressure monitor and encourage.  Will reassess in 3 months.  She will call if blood pressures are consistently greater than 140 over 90 CVD, status post hypertensive lacunar stroke

## 2015-06-18 NOTE — Addendum Note (Signed)
Addended by: Jimmye Norman on: 06/18/2015 01:14 PM   Modules accepted: Orders

## 2015-06-18 NOTE — Progress Notes (Signed)
Pre visit review using our clinic review tool, if applicable. No additional management support is needed unless otherwise documented below in the visit note. 

## 2015-06-18 NOTE — Patient Instructions (Signed)
Limit your sodium (Salt) intake    It is important that you exercise regularly, at least 20 minutes 3 to 4 times per week.  If you develop chest pain or shortness of breath seek  medical attention.  Return in 3 months for follow-up  Please check your blood pressure on a regular basis.  If it is consistently greater than 140/90, please make an office appointment.

## 2015-06-19 ENCOUNTER — Telehealth: Payer: Self-pay

## 2015-06-19 NOTE — Telephone Encounter (Signed)
Spoke to patient. Gave imaging results. Patient verbalized understanding.

## 2015-06-19 NOTE — Telephone Encounter (Signed)
-----   Message from Micki Riley, MD sent at 06/19/2015  8:38 AM EDT ----- Joneen Roach inform the patient that MRA of both the brain and the neck is unremarkable and does not show any major blockages to worry about

## 2015-06-22 ENCOUNTER — Telehealth: Payer: Self-pay

## 2015-06-22 NOTE — Telephone Encounter (Signed)
Spoke to patient. Gave MRI results. Patient verbalized understanding.  

## 2015-06-22 NOTE — Telephone Encounter (Signed)
-----   Message from Pramod S Sethi, MD sent at 06/19/2015  8:38 AM EDT ----- Kindly inform the patient that MRA of both the brain and the neck is unremarkable and does not show any major blockages to worry about 

## 2015-07-01 NOTE — Progress Notes (Signed)
HPI: FU palpitations and evaluate CP. She has a long history of palpitations and presyncope. Patient apparently noted to have a PFO on transesophageal echocardiogram in 2006 following CVA. Echocardiogram in September 2011 showed normal LV function, grade 1 diastolic dysfunction, mildly dilated descending aorta and trace aortic insufficiency. Event monitor in 2011 for palpitations showed sinus with PACs. Nuclear study September 2016 showed apical thinning and mild ischemia in the inferior lateral wall. Cardiac catheterization September 2016 showed no obstructive coronary disease and normal LV function. Since last seen, She complains of dyspnea, fatigue and chest pain.  Current Outpatient Prescriptions  Medication Sig Dispense Refill  . atorvastatin (LIPITOR) 40 MG tablet TAKE 1 TABLET BY MOUTH EVERY DAY (Patient taking differently: TAKE 40 MG BY MOUTH EVERY DAY) 90 tablet 1  . butalbital-acetaminophen-caffeine (FIORICET, ESGIC) 50-325-40 MG per tablet TAKE 1 TO 2 TABLETS BY MOUTH EVERY 6 HOURS AS NEEDED FOR HEADACHE 120 tablet 1  . clopidogrel (PLAVIX) 75 MG tablet Take 1 tablet (75 mg total) by mouth every evening. 90 tablet 1  . diclofenac (VOLTAREN) 75 MG EC tablet TAKE 1 TABLET BY MOUTH TWICE A DAY (Patient taking differently: TAKE 75 MG BY MOUTH TWICE A DAY) 180 tablet 1  . levothyroxine (SYNTHROID, LEVOTHROID) 125 MCG tablet TAKE 1 TABLET BY MOUTH DAILY BEFORE BREAKFAST. (Patient taking differently: TAKE 125 MCG BY MOUTH DAILY BEFORE BREAKFAST.) 90 tablet 3  . lisinopril-hydrochlorothiazide (ZESTORETIC) 20-12.5 MG tablet Take 1 tablet by mouth daily. 90 tablet 3  . tetrahydrozoline 0.05 % ophthalmic solution Place 2 drops into both eyes as needed (for red eyes).     . traMADol (ULTRAM) 50 MG tablet TAKE 1 TABLET BY MOUTH EVERY 6 HOURS AS NEEDED FOR PAIN (Patient taking differently: TAKE 50 MG BY MOUTH EVERY 6 HOURS AS NEEDED FOR PAIN) 90 tablet 2  . verapamil (VERELAN PM) 240 MG 24 hr  capsule Take 1 capsule (240 mg total) by mouth at bedtime. 30 capsule 12   No current facility-administered medications for this visit.     Past Medical History  Diagnosis Date  . Hyperlipidemia   . History of colonic polyps   . Late effect of adverse effect of drug, medicinal or biological substance   . History of tension headache   . Heart palpitations   . Diverticulitis 09-05-11    hx. gastritis, diverticulitis x2 -now surgery planned  . Patent foramen ovale     Small - unable to be closed -   . Essential hypertension 09-05-11    tx. Verapamil  . Hypothyroidism 09-05-11    Supplement used  . Degenerative joint disease of cervical spine 09-05-11    Cervival area, now some osteoarthritis-lower back and Rt. shoulder  . Osteoarthritis 112-17-12    spine and rt. hip, rt. shoulder  . Sleep apnea 09-05-11    no cpap ever, had surgery to remove cartilage, no problems now  . Stroke (HCC) 09-05-11    2006/2009-(loss of memory, balance issues remains occ.)  . PONV (postoperative nausea and vomiting)   . Headache(784.0)   . Bruises easily   . DDD (degenerative disc disease)   . DIVERTICULITIS, ACUTE 02/10/2010  . URI 09/02/2008  . TIA 09/28/2007  . SORE THROAT 04/30/2009  . NECK PAIN, ACUTE 09/21/2010  . GROIN PAIN 09/21/2010  . Unstable angina (HCC)     a. 05/2010 Cath: nl cors, EF 55%;  b. 05/2015 Lexiscan MV: small, severe, fixed apical defect and a small, severe, reversible  inf lateral defect w/ apical thinning and mild ischemia, EF 54%.    Past Surgical History  Procedure Laterality Date  . Abdominal hysterectomy    . Thyroidectomy, partial  09-05-11  . Elbow surgery  09-05-11    left elbow -ligament repair  . Umbilical hernia repair  yrs ago  . Colon resection  09/07/2011    Procedure: LAPAROSCOPIC SIGMOID COLON RESECTION;  Surgeon: Mariella Saa, MD;  Location: [facility];  Service: General;  Laterality: N/A;  with proctoscopy  . Laparoscopy  09/07/2011    Procedure:  LAPAROSCOPY DIAGNOSTIC;  Surgeon: Roselle Locus II;  Location: [facility];  Service: Gynecology;  Laterality: N/A;  . Salpingoophorectomy  09/07/2011    Procedure: SALPINGO OOPHERECTOMY;  Surgeon: Roselle Locus II;  Location: [facility];  Service: Gynecology;  Laterality: Right;  . Tubal ligation  1983  . Ventral hernia repair  06/20/2012    Procedure: LAPAROSCOPIC VENTRAL HERNIA;  Surgeon: Mariella Saa, MD;  Location: [facility];  Service: General;  Laterality: N/A;  Laparoscopic Repair of Ventral Hernia with mesh  . Ventral hernia repair N/A 12/13/2013    Procedure: LAPAROSCOPIC REPAIR RECURRENT VENTRAL INCISIONAL  HERNIA;  Surgeon: Mariella Saa, MD;  Location: [facility];  Service: General;  Laterality: N/A;  . Insertion of mesh N/A 12/13/2013    Procedure: INSERTION OF MESH;  Surgeon: Mariella Saa, MD;  Location: [facility];  Service: General;  Laterality: N/A;  . Cardiac catheterization N/A 06/01/2015    Procedure: Left Heart Cath and Coronary Angiography;  Surgeon: Kathleene Hazel, MD;  Location: [facility] INVASIVE CV LAB;  Service: Cardiovascular;  Laterality: N/A;    Social History   Social History  . Marital Status: Married    Spouse Name: N/A  . Number of Children: N/A  . Years of Education: N/A   Occupational History  . Not on file.   Social History Main Topics  . Smoking status: Former Smoker -- 0.50 packs/day for 5 years    Types: Cigarettes    Quit date: 09/19/1968  . Smokeless tobacco: Never Used  . Alcohol Use: 0.0 oz/week    0 Standard drinks or equivalent per week     Comment: previously a moderate drinker but none in several  years.  . Drug Use: No  . Sexual Activity: Yes   Other Topics Concern  . Not on file   Social History Narrative   Lives in [place] with her husband.  She does not routinely exercise.    ROS: no fevers or chills, productive cough, hemoptysis, dysphasia, odynophagia, melena, hematochezia, dysuria, hematuria, rash, seizure activity,  orthopnea, PND, pedal edema, claudication. Remaining systems are negative.  Physical Exam: Well-developed well-nourished in no acute distress.  Skin is warm and dry.  HEENT is normal.  Neck is supple.  Chest is clear to auscultation with normal expansion.  Cardiovascular exam is regular rate and rhythm.  Abdominal exam nontender or distended. No masses palpated. Extremities show no edema. neuro grossly intact

## 2015-07-03 ENCOUNTER — Encounter: Payer: Self-pay | Admitting: Cardiology

## 2015-07-03 ENCOUNTER — Ambulatory Visit (INDEPENDENT_AMBULATORY_CARE_PROVIDER_SITE_OTHER): Payer: Medicare PPO | Admitting: Cardiology

## 2015-07-03 VITALS — BP 126/76 | HR 58 | Ht 65.0 in | Wt 187.0 lb

## 2015-07-03 DIAGNOSIS — R072 Precordial pain: Secondary | ICD-10-CM | POA: Diagnosis not present

## 2015-07-03 DIAGNOSIS — R002 Palpitations: Secondary | ICD-10-CM | POA: Diagnosis not present

## 2015-07-03 DIAGNOSIS — I10 Essential (primary) hypertension: Secondary | ICD-10-CM | POA: Diagnosis not present

## 2015-07-03 NOTE — Assessment & Plan Note (Signed)
Blood pressure controlled. Continue present medications. 

## 2015-07-03 NOTE — Assessment & Plan Note (Signed)
-   Continue verapamil 

## 2015-07-03 NOTE — Assessment & Plan Note (Signed)
Patient continues to have occasional chest pain.Catheterization revealed normal coronary arteries. No plans for further cardiac workup. Follow-up primary care.

## 2015-07-03 NOTE — Patient Instructions (Signed)
Your physician wants you to follow-up in: ONE YEAR WITH DR CRENSHAW You will receive a reminder letter in the mail two months in advance. If you don't receive a letter, please call our office to schedule the follow-up appointment.  

## 2015-07-03 NOTE — Assessment & Plan Note (Signed)
Continue statin. 

## 2015-07-07 NOTE — Telephone Encounter (Signed)
error 

## 2015-07-28 ENCOUNTER — Telehealth: Payer: Self-pay | Admitting: Internal Medicine

## 2015-07-28 NOTE — Telephone Encounter (Signed)
[facility]

## 2015-07-28 NOTE — Telephone Encounter (Signed)
Left message for patient to call office back.

## 2015-07-28 NOTE — Telephone Encounter (Signed)
Patient Name: Abigail [name]  DOB: 01/06/1946    Initial Comment caller states she has diarrhea, vomiting, chest pain and shortness of breath   Nurse Assessment  Nurse: Scarlette ArStandifer, RN, Heather Date/Time (Eastern Time): 07/28/2015 9:00:59 AM  Confirm and document reason for call. If symptomatic, describe symptoms. ---caller states she has diarrhea, vomiting all night, she is still having the diarrhea, but no vomiting., chest heaviness and shortness of breath, she feels like she did when she had pneumonia before.  Has the patient traveled out of the country within the last 30 days? ---Not Applicable  Does the patient have any new or worsening symptoms? ---Yes  Will a triage be completed? ---Yes  Related visit to physician within the last 2 weeks? ---No  Does the PT have any chronic conditions? (i.e. diabetes, asthma, etc.) ---Yes  List chronic conditions. ---heart problems, stroke     Guidelines    Guideline Title Affirmed Question Affirmed Notes  Chest Pain [1] Chest pain lasts > 5 minutes AND [2] age > 3650    Final Disposition User   Call EMS 911 Now Standifer, RN, Insurance underwriterHeather    Comments  Caller states that she is not going to go to the ED, she just wants to make an appt with her doctor. She cannot sit in the ED and wait to be seen.   Disagree/Comply: Disagree  Disagree/Comply Reason: Disagree with instructions

## 2015-07-29 ENCOUNTER — Ambulatory Visit: Payer: Medicare PPO | Admitting: Adult Health

## 2015-08-03 ENCOUNTER — Other Ambulatory Visit: Payer: Self-pay | Admitting: Internal Medicine

## 2015-08-06 HISTORY — PX: COLONOSCOPY: SHX174

## 2015-08-11 ENCOUNTER — Other Ambulatory Visit: Payer: Self-pay | Admitting: Internal Medicine

## 2015-08-18 ENCOUNTER — Ambulatory Visit (INDEPENDENT_AMBULATORY_CARE_PROVIDER_SITE_OTHER): Payer: Medicare PPO | Admitting: Family Medicine

## 2015-08-18 ENCOUNTER — Ambulatory Visit: Payer: Medicare PPO | Admitting: Adult Health

## 2015-08-18 ENCOUNTER — Encounter: Payer: Self-pay | Admitting: Family Medicine

## 2015-08-18 VITALS — BP 133/79 | HR 73 | Temp 98.7°F | Ht 65.0 in | Wt 184.0 lb

## 2015-08-18 DIAGNOSIS — K5732 Diverticulitis of large intestine without perforation or abscess without bleeding: Secondary | ICD-10-CM | POA: Diagnosis not present

## 2015-08-18 MED ORDER — CIPROFLOXACIN HCL 500 MG PO TABS: 500.0000 mg | ORAL_TABLET | Freq: Two times a day (BID) | ORAL | Status: DC

## 2015-08-18 MED ORDER — METRONIDAZOLE 500 MG PO TABS: 500.0000 mg | ORAL_TABLET | Freq: Three times a day (TID) | ORAL | Status: DC

## 2015-08-18 NOTE — Progress Notes (Signed)
   Subjective:    Patient ID: Abigail [name], female    DOB: 04/21/1946, 69 y.o.   MRN: [id number]  HPI Here for  2 days of LLQ abdominal pains and loose stools. No fever, no nausea. No urinary symptoms.    Review of Systems  Constitutional: Negative.   Respiratory: Negative.   Cardiovascular: Negative.   Gastrointestinal: Positive for abdominal pain and diarrhea. Negative for nausea, vomiting, constipation, blood in stool, abdominal distention and rectal pain.       Objective:   Physical Exam  Constitutional: She appears well-developed and well-nourished.  Cardiovascular: Normal rate, regular rhythm, normal heart sounds and intact distal pulses.   Pulmonary/Chest: Effort normal and breath sounds normal.  Abdominal: Soft. She exhibits no distension and no mass. There is no rebound and no guarding.  Tender in the LLQ           Assessment & Plan:  Diverticulitis, treat with Cipro and Flagyl.

## 2015-08-18 NOTE — Progress Notes (Signed)
Pre visit review using our clinic review tool, if applicable. No additional management support is needed unless otherwise documented below in the visit note. 

## 2015-08-25 ENCOUNTER — Ambulatory Visit: Payer: Medicare PPO | Admitting: Neurology

## 2015-08-31 DIAGNOSIS — Z5181 Encounter for therapeutic drug level monitoring: Secondary | ICD-10-CM | POA: Diagnosis not present

## 2015-08-31 DIAGNOSIS — Z7901 Long term (current) use of anticoagulants: Secondary | ICD-10-CM | POA: Diagnosis not present

## 2015-09-01 DIAGNOSIS — Z09 Encounter for follow-up examination after completed treatment for conditions other than malignant neoplasm: Secondary | ICD-10-CM | POA: Diagnosis not present

## 2015-09-01 DIAGNOSIS — K649 Unspecified hemorrhoids: Secondary | ICD-10-CM | POA: Diagnosis not present

## 2015-09-01 DIAGNOSIS — K641 Second degree hemorrhoids: Secondary | ICD-10-CM | POA: Diagnosis not present

## 2015-09-01 DIAGNOSIS — K5730 Diverticulosis of large intestine without perforation or abscess without bleeding: Secondary | ICD-10-CM | POA: Diagnosis not present

## 2015-09-01 DIAGNOSIS — K648 Other hemorrhoids: Secondary | ICD-10-CM | POA: Diagnosis not present

## 2015-09-01 DIAGNOSIS — Z86010 Personal history of colonic polyps: Secondary | ICD-10-CM | POA: Diagnosis not present

## 2015-09-18 ENCOUNTER — Encounter: Payer: Self-pay | Admitting: Internal Medicine

## 2015-09-23 ENCOUNTER — Telehealth: Payer: Self-pay | Admitting: Internal Medicine

## 2015-09-23 NOTE — Telephone Encounter (Signed)
Patient Name: Abigail [name]  DOB: 08/19/1946    Initial Comment Caller states neck glands are tender, congestion, can't get a productive cough    Nurse Assessment  Nurse: Vickey SagesAtkins, RN, Jacquilin Date/Time (Eastern Time): 09/23/2015 8:55:29 AM  Confirm and document reason for call. If symptomatic, describe symptoms. ---Caller states neck glands are tender, congestion, can't get a productive cough - unknown fever. Symptoms have gotten worse since Sunday.  Has the patient traveled out of the country within the last 30 days? ---No  Does the patient have any new or worsening symptoms? ---Yes  Will a triage be completed? ---Yes  Related visit to physician within the last 2 weeks? ---No  Does the PT have any chronic conditions? (i.e. diabetes, asthma, etc.) ---Yes  List chronic conditions. ---htn, hypothyroid  Is this a behavioral health or substance abuse call? ---No     Guidelines    Guideline Title Affirmed Question Affirmed Notes  Cough - Acute Non-Productive SEVERE coughing spells (e.g., whooping sound after coughing, vomiting after coughing)    Final Disposition User   See Physician within 24 Hours Atkins, RN, Jacquilin    Comments  Appointment made for 1/5 at 1030- with Dr Clent RidgesFry    Referrals  REFERRED TO PCP OFFICE   Disagree/Comply: Comply

## 2015-09-24 ENCOUNTER — Encounter: Payer: Self-pay | Admitting: Family Medicine

## 2015-09-24 ENCOUNTER — Ambulatory Visit (INDEPENDENT_AMBULATORY_CARE_PROVIDER_SITE_OTHER): Payer: Medicare Other | Admitting: Family Medicine

## 2015-09-24 VITALS — BP 119/74 | HR 72 | Temp 98.4°F | Ht 65.0 in | Wt 182.0 lb

## 2015-09-24 DIAGNOSIS — J209 Acute bronchitis, unspecified: Secondary | ICD-10-CM | POA: Diagnosis not present

## 2015-09-24 MED ORDER — LEVOFLOXACIN 500 MG PO TABS: 500.0000 mg | ORAL_TABLET | Freq: Every day | ORAL | Status: AC

## 2015-09-24 NOTE — Progress Notes (Signed)
Pre visit review using our clinic review tool, if applicable. No additional management support is needed unless otherwise documented below in the visit note. 

## 2015-09-24 NOTE — Progress Notes (Signed)
   Subjective:    Patient ID: Abigail [name], female    DOB: 08/31/1946, 70 y.o.   MRN: [id number]  HPI Here for 5 days of stuffy head, PND, chest tightness and coughing up yellow sputum. No fever.    Review of Systems  Constitutional: Negative.   HENT: Positive for congestion and postnasal drip.   Eyes: Negative.   Respiratory: Positive for cough, chest tightness and shortness of breath. Negative for wheezing.   Cardiovascular: Negative.        Objective:   Physical Exam  Constitutional: She appears well-developed and well-nourished. No distress.  HENT:  Right Ear: External ear normal.  Left Ear: External ear normal.  Nose: Nose normal.  Mouth/Throat: Oropharynx is clear and moist.  Eyes: Conjunctivae are normal.  Pulmonary/Chest: Effort normal. No respiratory distress. She has no wheezes. She has no rales.  Scattered rhonchi   Lymphadenopathy:    She has no cervical adenopathy.          Assessment & Plan:  Bronchitis. Treat with Levaquin and Delsym

## 2015-09-29 ENCOUNTER — Telehealth: Payer: Self-pay | Admitting: *Deleted

## 2015-09-29 NOTE — Telephone Encounter (Signed)
Ok for cataract surgery Abigail [name]  

## 2015-09-29 NOTE — Telephone Encounter (Signed)
Pt is needing clearance for cataract extraction w/intraocular lens implantation of the left eye followed by the right eye. Will forward for dr Jens Somcrenshaw review

## 2015-09-30 NOTE — Telephone Encounter (Signed)
Will forward to [facility].

## 2015-10-12 ENCOUNTER — Encounter: Payer: Self-pay | Admitting: Internal Medicine

## 2015-10-12 ENCOUNTER — Telehealth: Payer: Self-pay | Admitting: Internal Medicine

## 2015-10-12 ENCOUNTER — Ambulatory Visit (INDEPENDENT_AMBULATORY_CARE_PROVIDER_SITE_OTHER): Payer: Medicare Other | Admitting: Internal Medicine

## 2015-10-12 VITALS — BP 140/82 | HR 75 | Temp 98.9°F | Resp 20 | Ht 65.0 in | Wt 182.0 lb

## 2015-10-12 DIAGNOSIS — I10 Essential (primary) hypertension: Secondary | ICD-10-CM | POA: Diagnosis not present

## 2015-10-12 DIAGNOSIS — B9789 Other viral agents as the cause of diseases classified elsewhere: Principal | ICD-10-CM

## 2015-10-12 DIAGNOSIS — J069 Acute upper respiratory infection, unspecified: Secondary | ICD-10-CM | POA: Diagnosis not present

## 2015-10-12 MED ORDER — BENZONATATE 200 MG PO CAPS: 200.0000 mg | ORAL_CAPSULE | Freq: Two times a day (BID) | ORAL | Status: DC | PRN

## 2015-10-12 NOTE — Telephone Encounter (Signed)
Please see message and advise 

## 2015-10-12 NOTE — Progress Notes (Signed)
Pre visit review using our clinic review tool, if applicable. No additional management support is needed unless otherwise documented below in the visit note. 

## 2015-10-12 NOTE — Progress Notes (Signed)
Subjective:    Patient ID: Abigail [name], female    DOB: 27-Jul-1946, 70 y.o.   MRN: [id number]  HPI  70 year old patient who has a history of essential hypertension as well as cerebrovascular disease.  The past week she's had increasing cough, congestion, and general sense of unwellness. She was seen by another provider on January 5 and was treated with antibiotic therapy for bronchitis.  She continues to have cough, but no documented fever.  No wheezing, chest pain or significant sputum production  Past Medical History  Diagnosis Date  . Hyperlipidemia   . History of colonic polyps   . Late effect of adverse effect of drug, medicinal or biological substance   . History of tension headache   . Heart palpitations   . Diverticulitis 09-05-11    hx. gastritis, diverticulitis x2 -now surgery planned  . Patent foramen ovale     Small - unable to be closed -   . Essential hypertension 09-05-11    tx. Verapamil  . Hypothyroidism 09-05-11    Supplement used  . Degenerative joint disease of cervical spine 09-05-11    Cervival area, now some osteoarthritis-lower back and Rt. shoulder  . Osteoarthritis 112-17-12    spine and rt. hip, rt. shoulder  . Sleep apnea 09-05-11    no cpap ever, had surgery to remove cartilage, no problems now  . Stroke (HCC) 09-05-11    2006/2009-(loss of memory, balance issues remains occ.)  . PONV (postoperative nausea and vomiting)   . Headache(784.0)   . Bruises easily   . DDD (degenerative disc disease)   . DIVERTICULITIS, ACUTE 02/10/2010  . URI 09/02/2008  . TIA 09/28/2007  . SORE THROAT 04/30/2009  . NECK PAIN, ACUTE 09/21/2010  . GROIN PAIN 09/21/2010  . Unstable angina (HCC)     a. 05/2010 Cath: nl cors, EF 55%;  b. 05/2015 Lexiscan MV: small, severe, fixed apical defect and a small, severe, reversible inf lateral defect w/ apical thinning and mild ischemia, EF 54%.    Social History   Social History  . Marital Status: Married    Spouse Name: N/A  .  Number of Children: N/A  . Years of Education: N/A   Occupational History  . Not on file.   Social History Main Topics  . Smoking status: Former Smoker -- 0.50 packs/day for 5 years    Types: Cigarettes    Quit date: 09/19/1968  . Smokeless tobacco: Never Used  . Alcohol Use: 0.0 oz/week    0 Standard drinks or equivalent per week     Comment: previously a moderate drinker but none in several  years.  . Drug Use: No  . Sexual Activity: Yes   Other Topics Concern  . Not on file   Social History Narrative   Lives in [place] with her husband.  She does not routinely exercise.    Past Surgical History  Procedure Laterality Date  . Abdominal hysterectomy    . Thyroidectomy, partial  09-05-11  . Elbow surgery  09-05-11    left elbow -ligament repair  . Umbilical hernia repair  yrs ago  . Colon resection  09/07/2011    Procedure: LAPAROSCOPIC SIGMOID COLON RESECTION;  Surgeon: Mariella Saa, MD;  Location: [facility];  Service: General;  Laterality: N/A;  with proctoscopy  . Laparoscopy  09/07/2011    Procedure: LAPAROSCOPY DIAGNOSTIC;  Surgeon: Roselle Locus II;  Location: [facility];  Service: Gynecology;  Laterality: N/A;  . Salpingoophorectomy  09/07/2011    Procedure: SALPINGO OOPHERECTOMY;  Surgeon: Roselle Locus II;  Location: [facility];  Service: Gynecology;  Laterality: Right;  . Tubal ligation  1983  . Ventral hernia repair  06/20/2012    Procedure: LAPAROSCOPIC VENTRAL HERNIA;  Surgeon: Mariella Saa, MD;  Location: [facility];  Service: General;  Laterality: N/A;  Laparoscopic Repair of Ventral Hernia with mesh  . Ventral hernia repair N/A 12/13/2013    Procedure: LAPAROSCOPIC REPAIR RECURRENT VENTRAL INCISIONAL  HERNIA;  Surgeon: Mariella Saa, MD;  Location: [facility];  Service: General;  Laterality: N/A;  . Insertion of mesh N/A 12/13/2013    Procedure: INSERTION OF MESH;  Surgeon: Mariella Saa, MD;  Location: [facility];  Service: General;  Laterality: N/A;  .  Cardiac catheterization N/A 06/01/2015    Procedure: Left Heart Cath and Coronary Angiography;  Surgeon: Kathleene Hazel, MD;  Location: [facility] INVASIVE CV LAB;  Service: Cardiovascular;  Laterality: N/A;    Family History  Problem Relation Age of Onset  . Arrhythmia Mother   . Hypertension Mother   . Stroke Mother   . Stroke Maternal Grandmother   . Diabetes Paternal Grandmother     Allergies  Allergen Reactions  . Aggrenox [Aspirin-Dipyridamole Er] Nausea And Vomiting and Other (See Comments)    Headache   . Dilaudid [Hydromorphone Hcl] Shortness Of Breath, Nausea And Vomiting and Other (See Comments)    Able to take morphine without issue  . Shellfish Allergy Shortness Of Breath and Rash    Only shrimp allergy  . Hydrocodone Nausea And Vomiting and Other (See Comments)    Can take morphine without issue  . Cephalexin Itching and Rash  . Codeine Phosphate Nausea And Vomiting and Other (See Comments)    REACTION: unspecified  . Contrast Media [Iodinated Diagnostic Agents] Other (See Comments)    Makes mouth water  . Meperidine Hcl Nausea And Vomiting  . Percocet [Oxycodone-Acetaminophen] Nausea And Vomiting and Other (See Comments)    Can take morphine without issue    Current Outpatient Prescriptions on File Prior to Visit  Medication Sig Dispense Refill  . atorvastatin (LIPITOR) 40 MG tablet TAKE 1 TABLET BY MOUTH EVERY DAY (Patient taking differently: TAKE 40 MG BY MOUTH EVERY DAY) 90 tablet 1  . butalbital-acetaminophen-caffeine (FIORICET, ESGIC) 50-325-40 MG tablet TAKE 1 TO 2 TABLETS BY MOUTH EVERY 6 HOURS AS NEEDED FOR HEADACHE 120 tablet 2  . clopidogrel (PLAVIX) 75 MG tablet Take 1 tablet (75 mg total) by mouth every evening. 90 tablet 1  . diclofenac (VOLTAREN) 75 MG EC tablet TAKE 1 TABLET BY MOUTH TWICE A DAY 180 tablet 1  . levothyroxine (SYNTHROID, LEVOTHROID) 125 MCG tablet TAKE 1 TABLET BY MOUTH DAILY BEFORE BREAKFAST. (Patient taking differently: TAKE 125  MCG BY MOUTH DAILY BEFORE BREAKFAST.) 90 tablet 3  . lisinopril-hydrochlorothiazide (ZESTORETIC) 20-12.5 MG tablet Take 1 tablet by mouth daily. 90 tablet 3  . tetrahydrozoline 0.05 % ophthalmic solution Place 2 drops into both eyes as needed (for red eyes).     . traMADol (ULTRAM) 50 MG tablet TAKE 1 TABLET BY MOUTH EVERY 6 HOURS AS NEEDED FOR PAIN (Patient taking differently: TAKE 50 MG BY MOUTH EVERY 6 HOURS AS NEEDED FOR PAIN) 90 tablet 2  . verapamil (VERELAN PM) 240 MG 24 hr capsule Take 1 capsule (240 mg total) by mouth at bedtime. 30 capsule 12   No current facility-administered medications on file prior to visit.    BP 140/82  mmHg  Pulse 75  Temp(Src) 98.9 F (37.2 C) (Oral)  Resp 20  Ht  (1.651 m)  Wt 182 lb (82.555 kg)  BMI 30.29 kg/m2  SpO2 98%     Review of Systems  Constitutional: Positive for fatigue. Negative for fever and chills.  HENT: Negative for congestion, dental problem, hearing loss, rhinorrhea, sinus pressure, sore throat and tinnitus.   Eyes: Negative for pain, discharge and visual disturbance.  Respiratory: Positive for cough. Negative for shortness of breath.   Cardiovascular: Negative for chest pain, palpitations and leg swelling.  Gastrointestinal: Negative for nausea, vomiting, abdominal pain, diarrhea, constipation, blood in stool and abdominal distention.  Genitourinary: Negative for dysuria, urgency, frequency, hematuria, flank pain, vaginal bleeding, vaginal discharge, difficulty urinating, vaginal pain and pelvic pain.  Musculoskeletal: Negative for joint swelling, arthralgias and gait problem.  Skin: Negative for rash.  Neurological: Negative for dizziness, syncope, speech difficulty, weakness, numbness and headaches.  Hematological: Negative for adenopathy.  Psychiatric/Behavioral: Negative for behavioral problems, dysphoric mood and agitation. The patient is not nervous/anxious.        Objective:   Physical Exam  Constitutional: She  is oriented to person, place, and time. She appears well-developed and well-nourished.  Frequent paroxysms of coughing Afebrile  HENT:  Head: Normocephalic.  Right Ear: External ear normal.  Left Ear: External ear normal.  Mild injection of the oropharynx  Eyes: Conjunctivae and EOM are normal. Pupils are equal, round, and reactive to light.  Neck: Normal range of motion. Neck supple. No thyromegaly present.  Cardiovascular: Normal rate, regular rhythm, normal heart sounds and intact distal pulses.   Pulmonary/Chest: Effort normal and breath sounds normal.  Abdominal: Soft. Bowel sounds are normal. She exhibits no mass. There is no tenderness.  Musculoskeletal: Normal range of motion.  Lymphadenopathy:    She has no cervical adenopathy.  Neurological: She is alert and oriented to person, place, and time.  Skin: Skin is warm and dry. No rash noted.  Psychiatric: She has a normal mood and affect. Her behavior is normal.          Assessment & Plan:   Viral bronchitis.  Will treat symptomatically patient report any fever, or new or worsening symptoms Essential hypertension, stable

## 2015-10-12 NOTE — Telephone Encounter (Signed)
Spoke to pt, told her there is nothing else Dr.K can give her with her codeine/hydrocodone allergy but to continue Mucinex DM. Pt verbalized understanding.

## 2015-10-12 NOTE — Telephone Encounter (Signed)
Pt call to say that she took the following medicine and it caused her to vomit . benzonatate (TESSALON) 200 MG capsule   She is asking if there is something else she can take    phamracy [facility]

## 2015-10-12 NOTE — Patient Instructions (Signed)
Acute bronchitis symptoms  are generally not helped by antibiotics.  Take over-the-counter expectorants and cough medications such as  Mucinex DM.  Call if there is no improvement in 5 to 7 days or if  you develop worsening cough, fever, or new symptoms, such as shortness of breath or chest pain.  HOME CARE INSTRUCTIONS  Drink plenty of water. Water helps thin the mucus so your sinuses can drain more easily.  Use a humidifier.  Inhale steam 3-4 times a day (for example, sit in the bathroom with the shower running).  Apply a warm, moist washcloth to your face 3-4 times a day, or as directed by your health care provider.  Use saline nasal sprays to help moisten and clean your sinuses.  Take medicines only as directed by your health care provider.  

## 2015-10-12 NOTE — Telephone Encounter (Signed)
No, not with her codeine/hydrocodone allergy Continue Mucinex DM

## 2015-10-22 ENCOUNTER — Telehealth: Payer: Self-pay | Admitting: Internal Medicine

## 2015-10-22 NOTE — Telephone Encounter (Signed)
Spoke to pt, asked her if we have a copy of her new insurance card? Pt stated yes. Told her okay, Tim Lair is going to work on the PA and she will contact you with the information once she is done. Pt verbalized understanding.

## 2015-10-22 NOTE — Telephone Encounter (Signed)
Pt states she has new insurance, [employer], and was told they will not cover  butalbital-acetaminophen-caffeine (FIORICET, ESGIC) 50-325-40 MG tablet  Pt states she spoke with you and would like to know if you have heard anything?

## 2015-10-23 ENCOUNTER — Other Ambulatory Visit: Payer: Self-pay | Admitting: Internal Medicine

## 2015-10-23 NOTE — Telephone Encounter (Signed)
PA submitted to [employer], please allow 3-5 business days for response from insurance.  Left a message on patient's voicemail notifying her of this.

## 2015-10-26 ENCOUNTER — Telehealth: Payer: Self-pay | Admitting: Internal Medicine

## 2015-10-26 NOTE — Telephone Encounter (Signed)
Fax received from [facility] stating prior authorization for butalbital-acetaminophen-caffeine (FIORICET, ESGIC) 50-325-40 MG tablet has been denied due to:  This medication not being on the patient's drug formulary list.  Patient's plan does not cover this drug unless the patient has previously tried  and failed drugs covered on the formulary that are clinical alternatives for patient's condition or if the prescribing provider submits documentation to indicate that these other drugs are not clinically appropriate to treat patient's condition.  Patient will need to first try all of the following covered medications:  (a) Brand Synalgos -DC (b) Naproxen (Anaprox DS, EC-Naprosyn, Naprelan, Naprosyn, naproxen, naproxen DR, naproxen ER) OR there are specific medical reason(s) why the alternative mediations are not appropriate to treat patient's medical condition.

## 2015-10-26 NOTE — Telephone Encounter (Signed)
Dr.K, pt needs a letter stating why she needs Fioricet medication and can not take anything else. Please write letter for insurance.

## 2015-10-26 NOTE — Telephone Encounter (Signed)
Abigail [name] from [employer] is calling to let our office know  the fioricet was denied and we must do an appeal. Please call PA [phone]

## 2015-11-03 NOTE — Telephone Encounter (Signed)
Letter faxed to Pharmacy

## 2015-11-03 NOTE — Telephone Encounter (Signed)
Tim Lair can you help with this. I sent a message to Dr.K to write a letter but no response or does he need to call.

## 2015-11-03 NOTE — Telephone Encounter (Addendum)
[employer] called for pt to follow up on the request for pt to take butalbital-acetaminophen-caffeine (FIORICET, ESGIC) 50-325-40 MG tablet   They would like the appeal information to go to [facility] asap.  Please see prior notes. Please follow up with pt. Thank you.

## 2015-11-04 ENCOUNTER — Telehealth: Payer: Self-pay | Admitting: Internal Medicine

## 2015-11-04 MED ORDER — BUTALBITAL-APAP-CAFFEINE 50-325-40 MG PO TABS: ORAL_TABLET | ORAL | Status: DC

## 2015-11-04 NOTE — Telephone Encounter (Signed)
Left message on voicemail Rx called into pharmacy  

## 2015-11-04 NOTE — Telephone Encounter (Signed)
Pt needs new rx for butalbital-acetaminophen-caffeine (FIORICET, ESGIC) 50-325-40 MG tablet   90 day supply Pt has no more refills, even though prior auth was sent  [facility]/pt has new insurance [employer].

## 2015-11-09 ENCOUNTER — Telehealth: Payer: Self-pay | Admitting: Internal Medicine

## 2015-11-09 NOTE — Telephone Encounter (Signed)
Brigitte from [facility],  Has clinical question for a medical appeal for patient, will be faxing over a request form. Phone # [phone]

## 2015-11-09 NOTE — Telephone Encounter (Signed)
Cancel.

## 2015-11-11 ENCOUNTER — Telehealth: Payer: Self-pay | Admitting: Cardiology

## 2015-11-11 NOTE — Telephone Encounter (Signed)
New message    Need clearance form by tomorrow afternoon.     Request for surgical clearance:  1. What type of surgery is being performed?   eye surgery    2. When is this surgery scheduled? 2.27.2017   3. Are there any medications that need to be held prior to surgery and how long? No meds need to be held   4. Name of physician performing surgery? Dr. Vonna Kotyk   5. What is your office phone and fax number? Fax # [phone]

## 2015-11-11 NOTE — Telephone Encounter (Signed)
Ok for surgery Abigail [name]  

## 2015-11-11 NOTE — Telephone Encounter (Signed)
Will forward this note to the number provided. 

## 2015-11-17 ENCOUNTER — Encounter: Payer: Self-pay | Admitting: Neurology

## 2015-11-17 ENCOUNTER — Other Ambulatory Visit: Payer: Self-pay

## 2015-11-17 ENCOUNTER — Ambulatory Visit (INDEPENDENT_AMBULATORY_CARE_PROVIDER_SITE_OTHER): Payer: Medicare Other | Admitting: Neurology

## 2015-11-17 VITALS — BP 139/76 | HR 59 | Ht 65.0 in | Wt 183.2 lb

## 2015-11-17 DIAGNOSIS — I639 Cerebral infarction, unspecified: Secondary | ICD-10-CM

## 2015-11-17 DIAGNOSIS — I6381 Other cerebral infarction due to occlusion or stenosis of small artery: Secondary | ICD-10-CM | POA: Insufficient documentation

## 2015-11-17 MED ORDER — BUTALBITAL-APAP-CAFFEINE 50-325-40 MG PO TABS: ORAL_TABLET | ORAL | Status: DC

## 2015-11-17 NOTE — Progress Notes (Signed)
[facility] [address] [place] [address] [phone]       OFFICE FOLLOW UP VISIT NOTE  Ms. ERIELLE SPROWL Date of Birth:  [birth year] Medical Record Number:  [id number]   Referring MD:  Beverely Low  Reason for Referral:  Dizziness and abnormal MRI brain  HPI: Initial Consult 06/03/2015 :  Ms Abigail [name] is a 57 year pleasant lady whose had five-month history of gait and balance difficulties. She just feels she often leans to the left and stumbles if she is not careful. She feels the symptoms began suddenly one day when she noticed trouble with balance and walking she also had some trouble swallowing but she did not seek immediate help at that time. She does have a prior history of her left thalamic lacunar infarct in 2007 for which she had seen me. That was felt to be due to small vessel disease and she was started on Aggrenox which did not tolerate due to side effects. She was lost to medical follow-up to me. She is recently had an outpatient MRI scan of the brain which have personally reviewed on 05/02/15 which does show a right pontine and cerebellar hyperintensity which is likely a age indeterminate lacunar infarcts which were not noticed on the previous MRI and perhaps may be the explanation of her recent dizziness and gait difficulties. The patient is currently on Plavix which is tolerating well without bleeding or bruising. She states her blood pressure has been difficult to control and today it is elevated at 171/97 in office. She did also had some recent chest pain or shortness of breath for which she underwent cardiac catheterization last Monday which did not show any correctable lesion. Dr. Jens Som for cardiologist has also added a baby aspirin and she seems to be having increased bruising tendency since then. She's not sure when her last lipid profile was checked but she does take Lipitor 40 mg daily which is tolerating well without side effects. She does have  history of sleep apnea and had surgery for that and does not use CPAP Update 11/17/15 : She returns for follow-up after last visit 5 months ago. She is accompanied by husband. Patient states she was doing fine until a few weeks ago since then she's noticed intermittent tingling in the left hand as well as weakness and dropping of objects from that hand. She's also noticed some walking difficulty and having to drag her left leg. She denies any slurred speech, headache, fall or injury. She was seen by me in September of last year and at that time MRI scan of the brain prior to that visit had shown small remote age lacunar infarcts in the right cerebellum and pons. I had ordered MRA of the neck and brain which have personally reviewed done on 06/03/15 which showed no significant intra-or extracranial stenosis. Lab work on 06/03/15 showed LDL cholesterol 84 but HDL of 77 with the ratio of 2.3. Hemoglobin A1c was borderline at 6.0. Patient has been on Plavix since 2006 following her previous stroke. She was unable to tolerate Aggrenox at that time. Patient states she is having gradual worsening of her balance and walking since spring of last year and perhaps had a small stroke at that time which was not recognized. She states she is tolerating Plavix well without bleeding or bruising. Her blood pressure is well controlled and today it is 139/76. The patient is refusing a referral to physical outpatient therapy but she wants to go and participate  in the [facility] program at the [facility]. She is tolerating Lipitor without myalgias or arthralgias. She does use a CPAP for sleep apnea. ROS:   14 system review of systems is positive for numbness, tingling, weakness, walking difficulty and all other systems negative e and all other systems negative  PMH:  Past Medical History  Diagnosis Date  . Hyperlipidemia   . History of colonic polyps   . Late effect of adverse effect of drug, medicinal or biological substance   .  History of tension headache   . Heart palpitations   . Diverticulitis 09-05-11    hx. gastritis, diverticulitis x2 -now surgery planned  . Patent foramen ovale     Small - unable to be closed -   . Essential hypertension 09-05-11    tx. Verapamil  . Hypothyroidism 09-05-11    Supplement used  . Degenerative joint disease of cervical spine 09-05-11    Cervival area, now some osteoarthritis-lower back and Rt. shoulder  . Osteoarthritis 112-17-12    spine and rt. hip, rt. shoulder  . Sleep apnea 09-05-11    no cpap ever, had surgery to remove cartilage, no problems now  . Stroke (HCC) 09-05-11    2006/2009-(loss of memory, balance issues remains occ.)  . PONV (postoperative nausea and vomiting)   . Bruises easily   . DDD (degenerative disc disease)   . DIVERTICULITIS, ACUTE 02/10/2010  . URI 09/02/2008  . TIA 09/28/2007  . SORE THROAT 04/30/2009  . NECK PAIN, ACUTE 09/21/2010  . GROIN PAIN 09/21/2010  . Unstable angina (HCC)     a. 05/2010 Cath: nl cors, EF 55%;  b. 05/2015 Lexiscan MV: small, severe, fixed apical defect and a small, severe, reversible inf lateral defect w/ apical thinning and mild ischemia, EF 54%.  Marland Kitchen Headache(784.0)     headaches are better    Social History:  Social History   Social History  . Marital Status: Married    Spouse Name: N/A  . Number of Children: N/A  . Years of Education: N/A   Occupational History  . Not on file.   Social History Main Topics  . Smoking status: Former Smoker -- 0.50 packs/day for 5 years    Types: Cigarettes    Quit date: 09/19/1968  . Smokeless tobacco: Never Used  . Alcohol Use: 1.2 oz/week    0 Standard drinks or equivalent, 1 Shots of liquor, 1 Cans of beer per week     Comment: socailly  . Drug Use: No  . Sexual Activity: Yes   Other Topics Concern  . Not on file   Social History Narrative   Lives in [place] with her husband.  She does not routinely exercise.    Medications:   Current Outpatient Prescriptions on  File Prior to Visit  Medication Sig Dispense Refill  . atorvastatin (LIPITOR) 40 MG tablet TAKE 1 TABLET BY MOUTH EVERY DAY (Patient taking differently: TAKE 40 MG BY MOUTH EVERY DAY) 90 tablet 1  . clopidogrel (PLAVIX) 75 MG tablet Take 1 tablet (75 mg total) by mouth every evening. 90 tablet 1  . diclofenac (VOLTAREN) 75 MG EC tablet TAKE 1 TABLET BY MOUTH TWICE A DAY 180 tablet 1  . levothyroxine (SYNTHROID, LEVOTHROID) 125 MCG tablet TAKE 1 TABLET BY MOUTH DAILY BEFORE BREAKFAST. (Patient taking differently: TAKE 125 MCG BY MOUTH DAILY BEFORE BREAKFAST.) 90 tablet 3  . lisinopril-hydrochlorothiazide (ZESTORETIC) 20-12.5 MG tablet Take 1 tablet by mouth daily. 90 tablet 3  . tetrahydrozoline  0.05 % ophthalmic solution Place 2 drops into both eyes as needed (for red eyes).     . traMADol (ULTRAM) 50 MG tablet TAKE 1 TABLET BY MOUTH EVERY 6 HOURS AS NEEDED FOR PAIN 90 tablet 2  . verapamil (VERELAN PM) 240 MG 24 hr capsule Take 1 capsule (240 mg total) by mouth at bedtime. 30 capsule 12   No current facility-administered medications on file prior to visit.    Allergies:   Allergies  Allergen Reactions  . Aggrenox [Aspirin-Dipyridamole Er] Nausea And Vomiting and Other (See Comments)    Headache   . Dilaudid [Hydromorphone Hcl] Shortness Of Breath, Nausea And Vomiting and Other (See Comments)    Able to take morphine without issue  . Shellfish Allergy Shortness Of Breath and Rash    Only shrimp allergy  . Hydrocodone Nausea And Vomiting and Other (See Comments)    Can take morphine without issue  . Cephalexin Itching and Rash  . Codeine Phosphate Nausea And Vomiting and Other (See Comments)    REACTION: unspecified  . Contrast Media [Iodinated Diagnostic Agents] Other (See Comments)    Makes mouth water  . Meperidine Hcl Nausea And Vomiting  . Percocet [Oxycodone-Acetaminophen] Nausea And Vomiting and Other (See Comments)    Can take morphine without issue    Physical  Exam General: well developed, well nourished [place] African-American lady , seated, in no evident distress Head: head normocephalic and atraumatic.   Neck: supple with no carotid or supraclavicular bruits Cardiovascular: regular rate and rhythm, no murmurs Musculoskeletal: no deformity Skin:  no rash/petichiae Vascular:  Normal pulses all extremities  Neurologic Exam Mental Status: Awake and fully alert. Oriented to place and time. Recent and remote memory intact. Attention span, concentration and fund of knowledge appropriate. Mood and affect appropriate.  Cranial Nerves: Fundoscopic exam reveals sharp disc margins. Pupils equal, briskly reactive to light. Extraocular movements full without nystagmus. Visual fields full to confrontation. Hearing intact. Facial sensation intact. Face, tongue, palate moves normally and symmetrically.  Motor: Normal bulk and tone.Mild weakness of left grip and intrinsic hand muscles. Orbits right over left upper extremity. Mild weakness of left hip flexors only.  Sensory.: intact to touch , pinprick , position and vibratory sensation.  Coordination: Rapid alternating movements normal in all extremities. Finger-to-nose and heel-to-shin performed accurately bilaterally. Gait and Station: Arises from chair without difficulty. Stance is slightly broad-based Gait demonstrates normal stride length and balance but slight dragging of the left leg.. Unable to heel, toe and tandem walk without difficulty.   Reflexes: 1+ and symmetric. Toes downgoing.   NIHSS  0 Modified Rankin  2   ASSESSMENT: 57 year Caucasian lady with five-month history of gait and balance difficulties likely due to right pontine and cerebellar infarcts etiology likely small vessel disease. Multiple vascular risk factors of hypertension, hyperlipidemia, h/o sleep apnoea  .Recent worsening of left-sided weakness in the last 2 weeks may also represent new right brain subcortical infarct    PLAN:   had a long d/w patient and husband about her recent complaints of increased left hand weakness numbness and left leg weakness may likely represent a subacute right brain subcortical infarct.. We also discussed risk for recurrent stroke/TIAs, personally independently reviewed imaging studies and stroke evaluation results and answered questions.Continue Plavix as she has been unable to tolerate Aggrenox in the past  for secondary stroke prevention and maintain strict control of hypertension with blood pressure goal below 130/90, diabetes with hemoglobin A1c goal below 6.5% and  lipids with LDL cholesterol goal below 70 mg/dL. she was also counseled to be compliant with her CPAP for sleep apnea I also advised the patient to eat a healthy diet with plenty of whole grains, cereals, fruits and vegetables, exercise regularly and maintain ideal body weight .The patient is refusing referral to physical and occupational therapy. Greater than 50% time during this 30 minute visit was spent on counseling and coordination of care for her stroke Followup in the future with stroke nurse practitioner in 3 months or call earlier if necessary.  Delia Heady, MD Note: This document was prepared with digital dictation and possible smart phrase technology. Any transcriptional errors that result from this process are unintentional.

## 2015-11-17 NOTE — Patient Instructions (Signed)
I had a long d/w Abigail [name] and husband about her recent complaints of increased left hand weakness numbness and left leg weakness may likely represent a subacute right brain subcortical infarct.. We also discussed risk for recurrent stroke/TIAs, personally independently reviewed imaging studies and stroke evaluation results and answered questions.Continue Plavix as she has been unable to tolerate Aggrenox in the past  for secondary stroke prevention and maintain strict control of hypertension with blood pressure goal below 130/90, diabetes with hemoglobin A1c goal below 6.5% and lipids with LDL cholesterol goal below 70 mg/dL. I also advised the Abigail [name] to eat a healthy diet with plenty of whole grains, cereals, fruits and vegetables, exercise regularly and maintain ideal body weight .The Abigail [name] is refusing referral to physical and occupational therapy. Followup in the future with stroke nurse practitioner in 3 months or call earlier if necessary. Stroke Prevention Some medical conditions and behaviors are associated with an increased chance of having a stroke. You may prevent a stroke by making healthy choices and managing medical conditions. HOW CAN I REDUCE MY RISK OF HAVING A STROKE?   Stay physically active. Get at least 30 minutes of activity on most or all days.  Do not smoke. It may also be helpful to avoid exposure to secondhand smoke.  Limit alcohol use. Moderate alcohol use is considered to be:  No more than 2 drinks per day for men.  No more than 1 drink per day for nonpregnant women.  Eat healthy foods. This involves:  Eating 5 or more servings of fruits and vegetables a day.  Making dietary changes that address high blood pressure (hypertension), high cholesterol, diabetes, or obesity.  Manage your cholesterol levels.  Making food choices that are high in fiber and low in saturated fat, trans fat, and cholesterol may control cholesterol levels.  Take any prescribed medicines to  control cholesterol as directed by your health care provider.  Manage your diabetes.  Controlling your carbohydrate and sugar intake is recommended to manage diabetes.  Take any prescribed medicines to control diabetes as directed by your health care provider.  Control your hypertension.  Making food choices that are low in salt (sodium), saturated fat, trans fat, and cholesterol is recommended to manage hypertension.  Ask your health care provider if you need treatment to lower your blood pressure. Take any prescribed medicines to control hypertension as directed by your health care provider.  If you are 47-36 years of age, have your blood pressure checked every 3-5 years. If you are 69 years of age or older, have your blood pressure checked every year.  Maintain a healthy weight.  Reducing calorie intake and making food choices that are low in sodium, saturated fat, trans fat, and cholesterol are recommended to manage weight.  Stop drug abuse.  Avoid taking birth control pills.  Talk to your health care provider about the risks of taking birth control pills if you are over 50 years old, smoke, get migraines, or have ever had a blood clot.  Get evaluated for sleep disorders (sleep apnea).  Talk to your health care provider about getting a sleep evaluation if you snore a lot or have excessive sleepiness.  Take medicines only as directed by your health care provider.  For some people, aspirin or blood thinners (anticoagulants) are helpful in reducing the risk of forming abnormal blood clots that can lead to stroke. If you have the irregular heart rhythm of atrial fibrillation, you should be on a blood thinner unless there is  a good reason you cannot take them.  Understand all your medicine instructions.  Make sure that other conditions (such as anemia or atherosclerosis) are addressed. SEEK IMMEDIATE MEDICAL CARE IF:   You have sudden weakness or numbness of the face, arm, or  leg, especially on one side of the body.  Your face or eyelid droops to one side.  You have sudden confusion.  You have trouble speaking (aphasia) or understanding.  You have sudden trouble seeing in one or both eyes.  You have sudden trouble walking.  You have dizziness.  You have a loss of balance or coordination.  You have a sudden, severe headache with no known cause.  You have new chest pain or an irregular heartbeat. Any of these symptoms may represent a serious problem that is an emergency. Do not wait to see if the symptoms will go away. Get medical help at once. Call your local emergency services (911 in [place] Do not drive yourself to the hospital.   This information is not intended to replace advice given to you by your health care provider. Make sure you discuss any questions you have with your health care provider.   Document Released: 10/13/2004 Document Revised: 09/26/2014 Document Reviewed: 03/08/2013 Elsevier Interactive Abigail [name] Education [employer]

## 2015-11-18 ENCOUNTER — Other Ambulatory Visit: Payer: Self-pay | Admitting: Internal Medicine

## 2015-11-18 LAB — LIPID PANEL
Chol/HDL Ratio: 2.5 ratio units (ref 0.0–4.4)
Cholesterol, Total: 153 mg/dL (ref 100–199)
HDL: 62 mg/dL (ref >39)
LDL CALC: 70 mg/dL (ref 0–99)
Triglycerides: 103 mg/dL (ref 0–149)
VLDL CHOLESTEROL CAL: 21 mg/dL (ref 5–40)

## 2015-11-19 LAB — LIPID PANEL

## 2015-11-26 ENCOUNTER — Ambulatory Visit
Admission: RE | Admit: 2015-11-26 | Discharge: 2015-11-26 | Disposition: A | Discharge status: Home / Self Care | Payer: Medicare Other | Source: Ambulatory Visit | Attending: Neurology | Admitting: Neurology

## 2015-11-26 DIAGNOSIS — I6381 Other cerebral infarction due to occlusion or stenosis of small artery: Secondary | ICD-10-CM

## 2015-11-26 DIAGNOSIS — I639 Cerebral infarction, unspecified: Secondary | ICD-10-CM | POA: Diagnosis not present

## 2015-11-26 MED ORDER — GADOBENATE DIMEGLUMINE 529 MG/ML IV SOLN
15.0000 mL | Freq: Once | INTRAVENOUS | Status: AC | PRN
  Administered 2015-11-26: 15 mL via INTRAVENOUS

## 2015-12-17 ENCOUNTER — Other Ambulatory Visit: Payer: Self-pay | Admitting: Internal Medicine

## 2016-02-05 ENCOUNTER — Encounter: Payer: Self-pay | Admitting: Internal Medicine

## 2016-02-05 ENCOUNTER — Ambulatory Visit (INDEPENDENT_AMBULATORY_CARE_PROVIDER_SITE_OTHER): Payer: Medicare Other | Admitting: Internal Medicine

## 2016-02-05 VITALS — BP 134/86 | HR 63 | Temp 98.4°F | Ht 65.0 in | Wt 186.0 lb

## 2016-02-05 DIAGNOSIS — M5440 Lumbago with sciatica, unspecified side: Secondary | ICD-10-CM | POA: Diagnosis not present

## 2016-02-05 DIAGNOSIS — I639 Cerebral infarction, unspecified: Secondary | ICD-10-CM | POA: Diagnosis not present

## 2016-02-05 DIAGNOSIS — E039 Hypothyroidism, unspecified: Secondary | ICD-10-CM | POA: Diagnosis not present

## 2016-02-05 DIAGNOSIS — I6381 Other cerebral infarction due to occlusion or stenosis of small artery: Secondary | ICD-10-CM

## 2016-02-05 DIAGNOSIS — G8929 Other chronic pain: Secondary | ICD-10-CM

## 2016-02-05 DIAGNOSIS — I10 Essential (primary) hypertension: Secondary | ICD-10-CM

## 2016-02-05 NOTE — Progress Notes (Signed)
Pre visit review using our clinic review tool, if applicable. No additional management support is needed unless otherwise documented below in the visit note. 

## 2016-02-05 NOTE — Progress Notes (Signed)
Subjective:    Patient ID: Abigail [name], female    DOB: 03/16/1946, 70 y.o.   MRN: [id number]  HPI  70 year old patient who has a history of hypertension and dyslipidemia.  She also has a history of cerebrovascular disease and did have a right brain lacunar infarct in February of this year.  She has a long history of low back pain that has intensified over the past 5 months.  She has seen Dr. Venetia MaxonStern in the past and request a referral. She is on diclofenac due to low back pain.  She is concerned about cardiovascular risk and the medication has not been very helpful.  She has been fairly active.  She does have a treadmill as well as a exercise bike at home.  She has signed up for a [employer] sne[employer] program.  She has obtained a new mattress.  Due to the worsening low back pain.  She states that she is only able to walk on the treadmill for 10 or 15 minutes before worsening pain ' Past Medical History  Diagnosis Date  . Hyperlipidemia   . History of colonic polyps   . Late effect of adverse effect of drug, medicinal or biological substance   . History of tension headache   . Heart palpitations   . Diverticulitis 09-05-11    hx. gastritis, diverticulitis x2 -now surgery planned  . Patent foramen ovale     Small - unable to be closed -   . Essential hypertension 09-05-11    tx. Verapamil  . Hypothyroidism 09-05-11    Supplement used  . Degenerative joint disease of cervical spine 09-05-11    Cervival area, now some osteoarthritis-lower back and Rt. shoulder  . Osteoarthritis 112-17-12    spine and rt. hip, rt. shoulder  . Sleep apnea 09-05-11    no cpap ever, had surgery to remove cartilage, no problems now  . Stroke (HCC) 09-05-11    2006/2009-(loss of memory, balance issues remains occ.)  . PONV (postoperative nausea and vomiting)   . Bruises easily   . DDD (degenerative disc disease)   . DIVERTICULITIS, ACUTE 02/10/2010  . URI 09/02/2008  . TIA 09/28/2007  . SORE THROAT 04/30/2009  .  NECK PAIN, ACUTE 09/21/2010  . GROIN PAIN 09/21/2010  . Unstable angina (HCC)     a. 05/2010 Cath: nl cors, EF 55%;  b. 05/2015 Lexiscan MV: small, severe, fixed apical defect and a small, severe, reversible inf lateral defect w/ apical thinning and mild ischemia, EF 54%.  Marland Kitchen. Headache(784.0)     headaches are better     Social History   Social History  . Marital Status: Married    Spouse Name: N/A  . Number of Children: N/A  . Years of Education: N/A   Occupational History  . Not on file.   Social History Main Topics  . Smoking status: Former Smoker -- 0.50 packs/day for 5 years    Types: Cigarettes    Quit date: 09/19/1968  . Smokeless tobacco: Never Used  . Alcohol Use: 1.2 oz/week    0 Standard drinks or equivalent, 1 Shots of liquor, 1 Cans of beer per week     Comment: socailly  . Drug Use: No  . Sexual Activity: Yes   Other Topics Concern  . Not on file   Social History Narrative   [place].  She does not routinely exercise.    Past Surgical History  Procedure Laterality Date  .  Abdominal hysterectomy    . Thyroidectomy, partial  09-05-11  . Elbow surgery  09-05-11    left elbow -ligament repair  . Umbilical hernia repair  yrs ago  . Colon resection  09/07/2011    Procedure: LAPAROSCOPIC SIGMOID COLON RESECTION;  Surgeon: Mariella Saa, MD;  Location: [facility];  Service: General;  Laterality: N/A;  with proctoscopy  . Laparoscopy  09/07/2011    Procedure: LAPAROSCOPY DIAGNOSTIC;  Surgeon: Roselle Locus II;  Location: [facility];  Service: Gynecology;  Laterality: N/A;  . Salpingoophorectomy  09/07/2011    Procedure: SALPINGO OOPHERECTOMY;  Surgeon: Roselle Locus II;  Location: [facility];  Service: Gynecology;  Laterality: Right;  . Tubal ligation  1983  . Ventral hernia repair  06/20/2012    Procedure: LAPAROSCOPIC VENTRAL HERNIA;  Surgeon: Mariella Saa, MD;  Location: [facility];  Service: General;  Laterality: N/A;  Laparoscopic Repair  of Ventral Hernia with mesh  . Ventral hernia repair N/A 12/13/2013    Procedure: LAPAROSCOPIC REPAIR RECURRENT VENTRAL INCISIONAL  HERNIA;  Surgeon: Mariella Saa, MD;  Location: [facility];  Service: General;  Laterality: N/A;  . Insertion of mesh N/A 12/13/2013    Procedure: INSERTION OF MESH;  Surgeon: Mariella Saa, MD;  Location: [facility];  Service: General;  Laterality: N/A;  . Cardiac catheterization N/A 06/01/2015    Procedure: Left Heart Cath and Coronary Angiography;  Surgeon: Kathleene Hazel, MD;  Location: [facility] INVASIVE CV LAB;  Service: Cardiovascular;  Laterality: N/A;    Family History  Problem Relation Age of Onset  . Arrhythmia Mother   . Hypertension Mother   . Stroke Mother   . Stroke Maternal Grandmother   . Diabetes Paternal Grandmother     Allergies  Allergen Reactions  . Aggrenox [Aspirin-Dipyridamole Er] Nausea And Vomiting and Other (See Comments)    Headache   . Dilaudid [Hydromorphone Hcl] Shortness Of Breath, Nausea And Vomiting and Other (See Comments)    Able to take morphine without issue  . Shellfish Allergy Shortness Of Breath and Rash    Only shrimp allergy  . Hydrocodone Nausea And Vomiting and Other (See Comments)    Can take morphine without issue  . Cephalexin Itching and Rash  . Codeine Phosphate Nausea And Vomiting and Other (See Comments)    REACTION: unspecified  . Contrast Media [Iodinated Diagnostic Agents] Nausea And Vomiting  . Meperidine Hcl Nausea And Vomiting  . Percocet [Oxycodone-Acetaminophen] Nausea And Vomiting and Other (See Comments)    Can take morphine without issue    Current Outpatient Prescriptions on File Prior to Visit  Medication Sig Dispense Refill  . atorvastatin (LIPITOR) 40 MG tablet TAKE 1 TABLET BY MOUTH EVERY DAY 90 tablet 2  . butalbital-acetaminophen-caffeine (FIORICET, ESGIC) 50-325-40 MG tablet TAKE 1 TO 2 TABLETS BY MOUTH EVERY 6 HOURS AS NEEDED FOR HEADACHE 120 tablet 2  . clopidogrel  (PLAVIX) 75 MG tablet Take 1 tablet (75 mg total) by mouth every evening. 90 tablet 1  . clopidogrel (PLAVIX) 75 MG tablet TAKE 1 TABLET BY MOUTH ONCE A DAY 90 tablet 1  . levothyroxine (SYNTHROID, LEVOTHROID) 125 MCG tablet TAKE 1 TABLET BY MOUTH DAILY BEFORE BREAKFAST. (Patient taking differently: TAKE 125 MCG BY MOUTH DAILY BEFORE BREAKFAST.) 90 tablet 3  . lisinopril-hydrochlorothiazide (ZESTORETIC) 20-12.5 MG tablet Take 1 tablet by mouth daily. 90 tablet 3  . tetrahydrozoline 0.05 % ophthalmic solution Place 2 drops into both eyes as needed (for red eyes).     Marland Kitchen  traMADol (ULTRAM) 50 MG tablet TAKE 1 TABLET BY MOUTH EVERY 6 HOURS AS NEEDED FOR PAIN 90 tablet 2  . verapamil (VERELAN PM) 240 MG 24 hr capsule Take 1 capsule (240 mg total) by mouth at bedtime. 30 capsule 12  . diclofenac (VOLTAREN) 75 MG EC tablet TAKE 1 TABLET BY MOUTH TWICE A DAY (Patient not taking: Reported on 02/05/2016) 180 tablet 1   No current facility-administered medications on file prior to visit.    BP 134/86 mmHg  Pulse 63  Temp(Src) 98.4 F (36.9 C) (Oral)  Ht  (1.651 m)  Wt 186 lb (84.369 kg)  BMI 30.95 kg/m2  SpO2 98%     Review of Systems  Constitutional: Negative.   HENT: Negative for congestion, dental problem, hearing loss, rhinorrhea, sinus pressure, sore throat and tinnitus.   Eyes: Negative for pain, discharge and visual disturbance.  Respiratory: Negative for cough and shortness of breath.   Cardiovascular: Negative for chest pain, palpitations and leg swelling.  Gastrointestinal: Negative for nausea, vomiting, abdominal pain, diarrhea, constipation, blood in stool and abdominal distention.  Genitourinary: Negative for dysuria, urgency, frequency, hematuria, flank pain, vaginal bleeding, vaginal discharge, difficulty urinating, vaginal pain and pelvic pain.  Musculoskeletal: Positive for back pain. Negative for joint swelling, arthralgias and gait problem.  Skin: Negative for rash.    Neurological: Negative for dizziness, syncope, speech difficulty, weakness, numbness and headaches.  Hematological: Negative for adenopathy.  Psychiatric/Behavioral: Negative for behavioral problems, dysphoric mood and agitation. The patient is not nervous/anxious.        Objective:   Physical Exam  Constitutional: She is oriented to person, place, and time. She appears well-developed and well-nourished.  HENT:  Head: Normocephalic.  Right Ear: External ear normal.  Left Ear: External ear normal.  Mouth/Throat: Oropharynx is clear and moist.  Eyes: Conjunctivae and EOM are normal. Pupils are equal, round, and reactive to light.  Neck: Normal range of motion. Neck supple. No thyromegaly present.  Surgical scar anterior neck  Cardiovascular: Normal rate, regular rhythm, normal heart sounds and intact distal pulses.   Pulmonary/Chest: Effort normal and breath sounds normal.  Abdominal: Soft. Bowel sounds are normal. She exhibits no mass. There is no tenderness.  Musculoskeletal: Normal range of motion.  Lymphadenopathy:    She has no cervical adenopathy.  Neurological: She is alert and oriented to person, place, and time.  Skin: Skin is warm and dry. No rash noted.  Psychiatric: She has a normal mood and affect. Her behavior is normal.          Assessment & Plan:   Essential hypertension, controlled Dyslipidemia.  Continue statin therapy Acute on chronic low back pain.  Discontinue diclofenac.  Follow-up Dr. Venetia Maxon Cerebrovascular disease.  Continue efforts that aggressive risk factor modification.  Discontinue diclofenac

## 2016-02-05 NOTE — Patient Instructions (Signed)
Follow-up with Dr. Venetia MaxonStern  Limit your sodium (Salt) intake  Please check your blood pressure on a regular basis.  If it is consistently greater than 150/90, please make an office appointment.    It is important that you exercise regularly, at least 20 minutes 3 to 4 times per week.  If you develop chest pain or shortness of breath seek  medical attention.

## 2016-02-08 ENCOUNTER — Telehealth: Payer: Self-pay | Admitting: Internal Medicine

## 2016-02-08 NOTE — Telephone Encounter (Signed)
Pt call to say that she was in so much pain over the weekend that she took the following medicine.  Pt said her appt with the neurosurgery is 03/09/16 Dr Venetia MaxonStern   DICLOFENAC

## 2016-02-09 NOTE — Telephone Encounter (Signed)
Please see message. °

## 2016-02-09 NOTE — Telephone Encounter (Signed)
Left message on voicemail to call office.  

## 2016-02-09 NOTE — Telephone Encounter (Signed)
Okay to continue diclofenac if helpful Use tramadol every 6 hours

## 2016-02-10 NOTE — Telephone Encounter (Signed)
Pt is returning donna call °

## 2016-02-11 NOTE — Telephone Encounter (Signed)
Left message on voicemail to call office.  

## 2016-02-11 NOTE — Telephone Encounter (Signed)
Pt called back, told her okay to take Diclofenac if it helps per Dr.K and use Tramadol every 6 hours. Pt verbalized understanding.

## 2016-02-12 ENCOUNTER — Ambulatory Visit (INDEPENDENT_AMBULATORY_CARE_PROVIDER_SITE_OTHER): Payer: Medicare Other | Admitting: Family Medicine

## 2016-02-12 ENCOUNTER — Encounter: Payer: Self-pay | Admitting: Family Medicine

## 2016-02-12 VITALS — BP 110/69 | HR 65 | Temp 98.9°F | Ht 65.0 in | Wt 184.0 lb

## 2016-02-12 DIAGNOSIS — J209 Acute bronchitis, unspecified: Secondary | ICD-10-CM

## 2016-02-12 MED ORDER — METHYLPREDNISOLONE 4 MG PO TBPK: ORAL_TABLET | ORAL | Status: DC

## 2016-02-12 MED ORDER — AZITHROMYCIN 250 MG PO TABS: ORAL_TABLET | ORAL | Status: DC

## 2016-02-12 NOTE — Progress Notes (Signed)
Pre visit review using our clinic review tool, if applicable. No additional management support is needed unless otherwise documented below in the visit note. 

## 2016-02-12 NOTE — Progress Notes (Signed)
   Subjective:    Patient ID: Enid DerryLynn G Berne, female    DOB: 12/08/1945, 70 y.o.   MRN: [id number]  HPI Here for 2 weeks of chest tightness and coughing up yellow sputum. No chest pain or fever.    Review of Systems  Constitutional: Negative.   HENT: Negative.   Eyes: Negative.   Respiratory: Positive for cough and chest tightness. Negative for shortness of breath and wheezing.   Cardiovascular: Negative.        Objective:   Physical Exam  Constitutional: She appears well-developed and well-nourished.  HENT:  Right Ear: External ear normal.  Left Ear: External ear normal.  Nose: Nose normal.  Mouth/Throat: Oropharynx is clear and moist.  Eyes: Conjunctivae are normal.  Neck: No thyromegaly present.  Cardiovascular: Normal rate, regular rhythm, normal heart sounds and intact distal pulses.   Pulmonary/Chest: Effort normal. No respiratory distress. She has no wheezes. She has no rales.  Scattered rhonchi   Lymphadenopathy:    She has no cervical adenopathy.          Assessment & Plan:  Bronchitis, treat with a Zpack and a Medrol dose pack.  Nelwyn SalisburyFRY,STEPHEN A, MD

## 2016-02-23 ENCOUNTER — Ambulatory Visit: Payer: Medicare Other | Admitting: Nurse Practitioner

## 2016-02-25 ENCOUNTER — Telehealth: Payer: Self-pay | Admitting: Neurology

## 2016-02-25 NOTE — Telephone Encounter (Signed)
Sent medical records to Dr. Venetia MaxonStern

## 2016-03-02 ENCOUNTER — Other Ambulatory Visit: Payer: Self-pay | Admitting: Internal Medicine

## 2016-03-04 NOTE — Telephone Encounter (Signed)
Okay for refill?  

## 2016-03-04 NOTE — Telephone Encounter (Signed)
Rx phoned in.   

## 2016-03-04 NOTE — Telephone Encounter (Signed)
Okay to refill? 

## 2016-03-18 ENCOUNTER — Ambulatory Visit (INDEPENDENT_AMBULATORY_CARE_PROVIDER_SITE_OTHER): Payer: Medicare Other | Admitting: Internal Medicine

## 2016-03-18 ENCOUNTER — Encounter: Payer: Self-pay | Admitting: Internal Medicine

## 2016-03-18 VITALS — BP 128/80 | HR 57 | Temp 98.4°F | Ht 65.0 in | Wt 183.0 lb

## 2016-03-18 DIAGNOSIS — M5440 Lumbago with sciatica, unspecified side: Secondary | ICD-10-CM

## 2016-03-18 DIAGNOSIS — I10 Essential (primary) hypertension: Secondary | ICD-10-CM

## 2016-03-18 MED ORDER — BUTALBITAL-APAP-CAFFEINE 50-325-40 MG PO TABS: ORAL_TABLET | ORAL | Status: DC

## 2016-03-18 NOTE — Progress Notes (Signed)
Pre visit review using our clinic review tool, if applicable. No additional management support is needed unless otherwise documented below in the visit note. 

## 2016-03-18 NOTE — Progress Notes (Signed)
Subjective:    Patient ID: Abigail [name], female    DOB: 1946-02-07, 70 y.o.   MRN: [id number]  HPI 70 year old patient who has a history of osteoarthritis and low back pain.  She is followed by orthopedics.  She was fairly stable until about 4 days ago when she first noted significant low back and especially left hip pain while turning in bed.  She has been fairly active with walking and using her treadmill.  Earlier the week.  She was quite painful.  Pain is aggravated by weightbearing.  Orthopedics has ordered an MRI that was performed 2 days ago.  She is scheduled for orthopedic follow-up next week.  She has been on a Medrol Dosepak and today is essentially pain-free  Past Medical History  Diagnosis Date  . Hyperlipidemia   . History of colonic polyps   . Late effect of adverse effect of drug, medicinal or biological substance   . History of tension headache   . Heart palpitations   . Diverticulitis 09-05-11    hx. gastritis, diverticulitis x2 -now surgery planned  . Patent foramen ovale     Small - unable to be closed -   . Essential hypertension 09-05-11    tx. Verapamil  . Hypothyroidism 09-05-11    Supplement used  . Degenerative joint disease of cervical spine 09-05-11    Cervival area, now some osteoarthritis-lower back and Rt. shoulder  . Osteoarthritis 112-17-12    spine and rt. hip, rt. shoulder  . Sleep apnea 09-05-11    no cpap ever, had surgery to remove cartilage, no problems now  . Stroke (HCC) 09-05-11    2006/2009-(loss of memory, balance issues remains occ.)  . PONV (postoperative nausea and vomiting)   . Bruises easily   . DDD (degenerative disc disease)   . DIVERTICULITIS, ACUTE 02/10/2010  . URI 09/02/2008  . TIA 09/28/2007  . SORE THROAT 04/30/2009  . NECK PAIN, ACUTE 09/21/2010  . GROIN PAIN 09/21/2010  . Unstable angina (HCC)     a. 05/2010 Cath: nl cors, EF 55%;  b. 05/2015 Lexiscan MV: small, severe, fixed apical defect and a small, severe, reversible inf  lateral defect w/ apical thinning and mild ischemia, EF 54%.  Marland Kitchen Headache(784.0)     headaches are better     Social History   Social History  . Marital Status: Married    Spouse Name: N/A  . Number of Children: N/A  . Years of Education: N/A   Occupational History  . Not on file.   Social History Main Topics  . Smoking status: Former Smoker -- 0.50 packs/day for 5 years    Types: Cigarettes    Quit date: 09/19/1968  . Smokeless tobacco: Never Used  . Alcohol Use: 1.2 oz/week    0 Standard drinks or equivalent, 1 Shots of liquor, 1 Cans of beer per week     Comment: socailly  . Drug Use: No  . Sexual Activity: Yes   Other Topics Concern  . Not on file   Social History Narrative   Lives in [place] with her husband.  She does not routinely exercise.    Past Surgical History  Procedure Laterality Date  . Abdominal hysterectomy    . Thyroidectomy, partial  09-05-11  . Elbow surgery  09-05-11    left elbow -ligament repair  . Umbilical hernia repair  yrs ago  . Colon resection  09/07/2011    Procedure: LAPAROSCOPIC SIGMOID COLON RESECTION;  Surgeon: Sharlet Salina T  Hoxworth, MD;  Location: [facility];  Service: General;  Laterality: N/A;  with proctoscopy  . Laparoscopy  09/07/2011    Procedure: LAPAROSCOPY DIAGNOSTIC;  Surgeon: Roselle LocusJames E Tomblin II;  Location: [facility];  Service: Gynecology;  Laterality: N/A;  . Salpingoophorectomy  09/07/2011    Procedure: SALPINGO OOPHERECTOMY;  Surgeon: Roselle LocusJames E Tomblin II;  Location: [facility];  Service: Gynecology;  Laterality: Right;  . Tubal ligation  1983  . Ventral hernia repair  06/20/2012    Procedure: LAPAROSCOPIC VENTRAL HERNIA;  Surgeon: Mariella SaaBenjamin T Hoxworth, MD;  Location: [facility];  Service: General;  Laterality: N/A;  Laparoscopic Repair of Ventral Hernia with mesh  . Ventral hernia repair N/A 12/13/2013    Procedure: LAPAROSCOPIC REPAIR RECURRENT VENTRAL INCISIONAL  HERNIA;  Surgeon: Mariella SaaBenjamin T Hoxworth, MD;  Location: [facility];  Service:  General;  Laterality: N/A;  . Insertion of mesh N/A 12/13/2013    Procedure: INSERTION OF MESH;  Surgeon: Mariella SaaBenjamin T Hoxworth, MD;  Location: [facility];  Service: General;  Laterality: N/A;  . Cardiac catheterization N/A 06/01/2015    Procedure: Left Heart Cath and Coronary Angiography;  Surgeon: Kathleene Hazelhristopher D McAlhany, MD;  [facility];  Service: Cardiovascular;  Laterality: N/A;    Family History  Problem Relation Age of Onset  . Arrhythmia Mother   . Hypertension Mother   . Stroke Mother   . Stroke Maternal Grandmother   . Diabetes Paternal Grandmother     Allergies  Allergen Reactions  . Aggrenox [Aspirin-Dipyridamole Er] Nausea And Vomiting and Other (See Comments)    Headache   . Dilaudid [Hydromorphone Hcl] Shortness Of Breath, Nausea And Vomiting and Other (See Comments)    Able to take morphine without issue  . Shellfish Allergy Shortness Of Breath and Rash    Only shrimp allergy  . Hydrocodone Nausea And Vomiting and Other (See Comments)    Can take morphine without issue  . Cephalexin Itching and Rash  . Codeine Phosphate Nausea And Vomiting and Other (See Comments)    REACTION: unspecified  . Contrast Media [Iodinated Diagnostic Agents] Nausea And Vomiting  . Meperidine Hcl Nausea And Vomiting  . Percocet [Oxycodone-Acetaminophen] Nausea And Vomiting and Other (See Comments)    Can take morphine without issue    Current Outpatient Prescriptions on File Prior to Visit  Medication Sig Dispense Refill  . atorvastatin (LIPITOR) 40 MG tablet TAKE 1 TABLET BY MOUTH EVERY DAY 90 tablet 2  . butalbital-acetaminophen-caffeine (FIORICET, ESGIC) 50-325-40 MG tablet TAKE 1 TO 2 TABLETS BY MOUTH EVERY 6 HOURS AS NEEDED FOR HEADACHE 120 tablet 2  . clopidogrel (PLAVIX) 75 MG tablet Take 1 tablet (75 mg total) by mouth every evening. 90 tablet 1  . levothyroxine (SYNTHROID, LEVOTHROID) 125 MCG tablet TAKE 1 TABLET BY MOUTH DAILY BEFORE BREAKFAST. (Patient taking  differently: TAKE 125 MCG BY MOUTH DAILY BEFORE BREAKFAST.) 90 tablet 3  . lisinopril-hydrochlorothiazide (ZESTORETIC) 20-12.5 MG tablet Take 1 tablet by mouth daily. 90 tablet 3  . methylPREDNISolone (MEDROL DOSEPAK) 4 MG TBPK tablet As directed 21 tablet 0  . tetrahydrozoline 0.05 % ophthalmic solution Place 2 drops into both eyes as needed (for red eyes). Reported on 02/12/2016    . traMADol (ULTRAM) 50 MG tablet TAKE 1 TABLET BY MOUTH EVERY 6 HOURS AS NEEDED FOR PAIN 90 tablet 0  . verapamil (VERELAN PM) 240 MG 24 hr capsule Take 1 capsule (240 mg total) by mouth at bedtime. 30 capsule 12   No current  facility-administered medications on file prior to visit.    BP 128/80 mmHg  Pulse 57  Temp(Src) 98.4 F (36.9 C) (Oral)  Ht 5\' 5"  (1.651 m)  Wt 183 lb (83.008 kg)  BMI 30.45 kg/m2  SpO2 98%      Review of Systems  Constitutional: Negative.   HENT: Negative for congestion, dental problem, hearing loss, rhinorrhea, sinus pressure, sore throat and tinnitus.   Eyes: Negative for pain, discharge and visual disturbance.  Respiratory: Negative for cough and shortness of breath.   Cardiovascular: Negative for chest pain, palpitations and leg swelling.  Gastrointestinal: Negative for nausea, vomiting, abdominal pain, diarrhea, constipation, blood in stool and abdominal distention.  Genitourinary: Negative for dysuria, urgency, frequency, hematuria, flank pain, vaginal bleeding, vaginal discharge, difficulty urinating, vaginal pain and pelvic pain.  Musculoskeletal: Positive for back pain. Negative for joint swelling, arthralgias and gait problem.  Skin: Negative for rash.  Neurological: Negative for dizziness, syncope, speech difficulty, weakness, numbness and headaches.  Hematological: Negative for adenopathy.  Psychiatric/Behavioral: Negative for behavioral problems, dysphoric mood and agitation. The patient is not nervous/anxious.        Objective:   Physical Exam  Constitutional:  She appears well-developed and well-nourished. No distress.  Blood pressure well controlled  Musculoskeletal:  Straight leg test is negative bilaterally           Assessment & Plan:   Probable resolving lumbar radiculopathy.  Follow-up orthopedics next week as scheduled.  Patient will complete her Medrol Dosepak.  She was told to hold diclofenac while on Medrol Essential hypertension, stable  Recheck 4 months or as needed  Rogelia BogaKWIATKOWSKI,PETER FRANK, MD

## 2016-03-18 NOTE — Patient Instructions (Signed)
Orthopedic follow-up as scheduled  Limit your sodium (Salt) intake  Please check your blood pressure on a regular basis.  If it is consistently greater than 150/90, please make an office appointment.  Return in 4 months for follow-up  Complete Medrol Dosepak as prescribed

## 2016-03-23 ENCOUNTER — Other Ambulatory Visit: Payer: Self-pay | Admitting: Neurosurgery

## 2016-03-23 ENCOUNTER — Other Ambulatory Visit: Payer: Self-pay | Admitting: Internal Medicine

## 2016-03-25 NOTE — Progress Notes (Signed)
Clearance form sent to Dr. Venetia MaxonStern at [phone] for surgery. Form fax twice to [phone].

## 2016-03-29 ENCOUNTER — Telehealth: Payer: Self-pay | Admitting: Internal Medicine

## 2016-03-29 NOTE — Telephone Encounter (Signed)
Pt would like to have a call to discuss her medication Diclofenac 75 mg for pain (she states that is the only thing that keep her pain at bay)  She is going to have surgery on 8/15.

## 2016-03-30 NOTE — Telephone Encounter (Signed)
Left message on voicemail to call office.  

## 2016-04-01 NOTE — Telephone Encounter (Signed)
Pt returned the call.

## 2016-04-04 ENCOUNTER — Other Ambulatory Visit: Payer: Self-pay | Admitting: Internal Medicine

## 2016-04-04 ENCOUNTER — Other Ambulatory Visit (HOSPITAL_COMMUNITY): Payer: Medicare Other

## 2016-04-04 MED ORDER — TRAMADOL HCL 50 MG PO TABS: 50.0000 mg | ORAL_TABLET | Freq: Four times a day (QID) | ORAL | Status: DC | PRN

## 2016-04-04 MED ORDER — DICLOFENAC SODIUM 75 MG PO TBEC: 75.0000 mg | DELAYED_RELEASE_TABLET | Freq: Two times a day (BID) | ORAL | Status: DC

## 2016-04-04 NOTE — Telephone Encounter (Signed)
Phoned in Tramadol and sent in Diclofenac to pharmacy. Explained to patient that she is on the maximum dose of Diclofenac per Dr. Kirtland BouchardK. Patient expressed understanding and stated she would not take any more than the recommended dosage. Also wanted Dr. Kirtland BouchardK to know she is not taking the fioricet because it just makes her dizzy.

## 2016-04-04 NOTE — Telephone Encounter (Signed)
Okay to refill? 

## 2016-04-04 NOTE — Telephone Encounter (Signed)
Okay to refill tramadol 50 mg 1 every 6 hours as needed for pain Okay to refill diclofenac 75 mg 1 twice a day.  This is the maximum daily dose

## 2016-04-04 NOTE — Telephone Encounter (Signed)
Patient scheduled for surgery 05-03-16

## 2016-04-04 NOTE — Telephone Encounter (Signed)
Pt calling back to discuss refill increase on diclofenac (VOLTAREN) 75 MG EC tablet.  Pt is requesting to increase this med to even  3 tabs /bid or whatever Dr Kirtland BouchardK would recommend to get her through the day.  Pt states she cannot take the opiates, and the voltaren is the only thing that helps. Pt needs to help get through until her surgery.  ALSO Pt request refill  traMADol (ULTRAM) 50 MG tablet [facility]

## 2016-04-22 ENCOUNTER — Encounter (HOSPITAL_COMMUNITY): Payer: Self-pay

## 2016-04-25 ENCOUNTER — Encounter (HOSPITAL_COMMUNITY)
Admission: RE | Admit: 2016-04-25 | Discharge: 2016-04-25 | Disposition: A | Discharge status: Home / Self Care | Payer: Medicare Other | Source: Ambulatory Visit | Attending: Neurosurgery | Admitting: Neurosurgery

## 2016-04-25 ENCOUNTER — Encounter (HOSPITAL_COMMUNITY): Payer: Self-pay

## 2016-04-25 DIAGNOSIS — I10 Essential (primary) hypertension: Secondary | ICD-10-CM | POA: Insufficient documentation

## 2016-04-25 DIAGNOSIS — Z01812 Encounter for preprocedural laboratory examination: Secondary | ICD-10-CM | POA: Diagnosis not present

## 2016-04-25 DIAGNOSIS — M4316 Spondylolisthesis, lumbar region: Secondary | ICD-10-CM | POA: Diagnosis not present

## 2016-04-25 DIAGNOSIS — Z8673 Personal history of transient ischemic attack (TIA), and cerebral infarction without residual deficits: Secondary | ICD-10-CM | POA: Diagnosis not present

## 2016-04-25 DIAGNOSIS — M4806 Spinal stenosis, lumbar region: Secondary | ICD-10-CM | POA: Diagnosis not present

## 2016-04-25 DIAGNOSIS — Z01818 Encounter for other preprocedural examination: Secondary | ICD-10-CM | POA: Diagnosis not present

## 2016-04-25 DIAGNOSIS — M5416 Radiculopathy, lumbar region: Secondary | ICD-10-CM | POA: Insufficient documentation

## 2016-04-25 DIAGNOSIS — E785 Hyperlipidemia, unspecified: Secondary | ICD-10-CM | POA: Insufficient documentation

## 2016-04-25 DIAGNOSIS — Z79899 Other long term (current) drug therapy: Secondary | ICD-10-CM | POA: Diagnosis not present

## 2016-04-25 DIAGNOSIS — Z7902 Long term (current) use of antithrombotics/antiplatelets: Secondary | ICD-10-CM | POA: Diagnosis not present

## 2016-04-25 DIAGNOSIS — G4733 Obstructive sleep apnea (adult) (pediatric): Secondary | ICD-10-CM | POA: Insufficient documentation

## 2016-04-25 DIAGNOSIS — Z0183 Encounter for blood typing: Secondary | ICD-10-CM | POA: Insufficient documentation

## 2016-04-25 DIAGNOSIS — E039 Hypothyroidism, unspecified: Secondary | ICD-10-CM | POA: Insufficient documentation

## 2016-04-25 HISTORY — DX: Major depressive disorder, single episode, unspecified: F32.9

## 2016-04-25 HISTORY — DX: Depression, unspecified: F32.A

## 2016-04-25 HISTORY — DX: Personal history of other diseases of the respiratory system: Z87.09

## 2016-04-25 HISTORY — DX: Nocturia: R35.1

## 2016-04-25 HISTORY — DX: Unspecified hearing loss, unspecified ear: H91.90

## 2016-04-25 HISTORY — DX: Unspecified hemorrhoids: K64.9

## 2016-04-25 HISTORY — DX: Personal history of pneumonia (recurrent): Z87.01

## 2016-04-25 LAB — BASIC METABOLIC PANEL
Anion gap: 6 (ref 5–15)
BUN: 17 mg/dL (ref 6–20)
CO2: 24 mmol/L (ref 22–32)
CREATININE: 0.74 mg/dL (ref 0.44–1.00)
Calcium: 9.8 mg/dL (ref 8.9–10.3)
Chloride: 109 mmol/L (ref 101–111)
GFR calc Af Amer: >60 mL/min (ref >60)
GFR calc non Af Amer: >60 mL/min (ref >60)
Glucose, Bld: 104 mg/dL — ABNORMAL HIGH (ref 65–99)
Potassium: 3.9 mmol/L (ref 3.5–5.1)
SODIUM: 139 mmol/L (ref 135–145)

## 2016-04-25 LAB — TYPE AND SCREEN
ABO/RH(D): O POS
ANTIBODY SCREEN: NEGATIVE

## 2016-04-25 LAB — CBC
HCT: 41.1 % (ref 36.0–46.0)
Hemoglobin: 13.1 g/dL (ref 12.0–15.0)
MCH: 29.4 pg (ref 26.0–34.0)
MCHC: 31.9 g/dL (ref 30.0–36.0)
MCV: 92.4 fL (ref 78.0–100.0)
PLATELETS: 140 10*3/uL — AB (ref 150–400)
RBC: 4.45 MIL/uL (ref 3.87–5.11)
RDW: 13.2 % (ref 11.5–15.5)
WBC: 6 10*3/uL (ref 4.0–10.5)

## 2016-04-25 LAB — SURGICAL PCR SCREEN
MRSA, PCR: NEGATIVE
STAPHYLOCOCCUS AUREUS: NEGATIVE

## 2016-04-25 LAB — ABO/RH: ABO/RH(D): O POS

## 2016-04-25 NOTE — Pre-Procedure Instructions (Signed)
Enid DerryLynn G Distler  04/25/2016      [id number] [place], [place] - [address] [facility] [address] [place] [address] Phone: [phone] Fax: [phone]    Your procedure is scheduled on Tuesday, August 15th, 2017.  [facility] Admitting at 9:00 A.M.   Call this number if you have problems the morning of surgery:  [phone]   Remember:  Do not eat food or drink liquids after midnight.   Take these medicines the morning of surgery with A SIP OF WATER: Levothyroxine (Synthroid), Tramadol (Ultram) if needed.    Follow your MD's instructions on Plavix (stop taking Plavix 5 days prior to surgery).   5 days prior to surgery, stop taking: Plavix, Diclofenac (Voltaren), Aspirin, NSAIDS, Aleve, Naproxen, Ibuprofen, Advil, Motrin, BC's, Goody's, Fish oil, all herbal medications, and all vitamins.    Do not wear jewelry, make-up or nail polish.  Do not wear lotions, powders, or perfumes.  You may NOT wear deoderant.  Do not shave 48 hours prior to surgery.    Do not bring valuables to the hospital.  [facility] is not responsible for any belongings or valuables.  Contacts, dentures or bridgework may not be worn into surgery.  Leave your suitcase in the car.  After surgery it may be brought to your room.  For patients admitted to the hospital, discharge time will be determined by your treatment team.  Patients discharged the day of surgery will not be allowed to drive home.   Special instructions:  Preparing for Surgery.   Please read over the following fact sheets that you were given. MRSA Information    [facility]- Preparing For Surgery  Before surgery, you can play an important role. Because skin is not sterile, your skin needs to be as free of germs as possible. You can reduce the number of germs on your skin by washing with CHG (chlorahexidine gluconate) Soap before surgery.  CHG is an antiseptic cleaner  which kills germs and bonds with the skin to continue killing germs even after washing.  Please do not use if you have an allergy to CHG or antibacterial soaps. If your skin becomes reddened/irritated stop using the CHG.  Do not shave (including legs and underarms) for at least 48 hours prior to first CHG shower. It is OK to shave your face.  Please follow these instructions carefully.   1. Shower the NIGHT BEFORE SURGERY and the MORNING OF SURGERY with CHG.   2. If you chose to wash your hair, wash your hair first as usual with your normal shampoo.  3. After you shampoo, rinse your hair and body thoroughly to remove the shampoo.  4. Use CHG as you would any other liquid soap. You can apply CHG directly to the skin and wash gently with a scrungie or a clean washcloth.   5. Apply the CHG Soap to your body ONLY FROM THE NECK DOWN.  Do not use on open wounds or open sores. Avoid contact with your eyes, ears, mouth and genitals (private parts). Wash genitals (private parts) with your normal soap.  6. Wash thoroughly, paying special attention to the area where your surgery will be performed.  7. Thoroughly rinse your body with warm water from the neck down.  8. DO NOT shower/wash with your normal soap after using and rinsing off the CHG Soap.  9. Pat yourself dry with a CLEAN TOWEL.  10. Wear CLEAN PAJAMAS   11. Place CLEAN SHEETS on your bed the night of your first shower and DO NOT SLEEP WITH PETS.  Day of Surgery: Do not apply any deodorants/lotions. Please wear clean clothes to the hospital/surgery center.

## 2016-04-25 NOTE — Progress Notes (Signed)
PCP - Dr. Eleonore ChiquitoPeter Kwiatkowski Cardiologist - Dr. Jens Somrenshaw Neurologist - Dr. Pearlean BrownieSethi   -clearance note in chart  EKG - 06/11/15 CXR - denies Echo - 2006 Stress test - 05/2015 Cardiac Cat - 06/01/2015  Patient denies chest pain and shortness of breath at PAT appointment.

## 2016-04-27 NOTE — Progress Notes (Addendum)
Anesthesia Chart Review:  Pt is a 70 year old female scheduled for L4-5, L5-1 maximum access PLIF on 05/03/2016 with Maeola HarmanJoseph Stern, MD.   - PCP is Eleonore ChiquitoPeter Kwiatkowski, MD. - Cardiologist is Olga MillersBrian Crenshaw, MD, last office visit 07/03/15; no further cardiac work up and pt to f/u with primary care.  - Neurologist is Delia HeadyPramod Sethi, MD who has cleared pt for surgery.   PMH includes:  HTN, stroke (identified on 05/02/15 outpatient MRI), TIA, PFO (small, unable to be repaired on 2006 cath), OSA, hypthyroidism, hyperlipidemia, post-op N/V.  S/p ventral hernia repair 12/13/13, 06/20/12. S/p sigmoid colon resection, RSO 09/07/11.   Medications include: lipitor, plavix, levothyroxine, lisinopril-hctz, verapamil. Pt to stop plavix 5 days before surgery.   Preoperative labs reviewed.    EKG 06/11/15: sinus bradycardia (50 bpm).   Cardiac cath 06/01/15:   Prox RCA lesion, 10% stenosed.  Prox Cx lesion, 10% stenosed.  Prox LAD to Dist LAD lesion, 10% stenosed.  The left ventricular systolic function is normal. 1. Mild non-obstructive CAD 2. Normal LV systolic function 3. Non-cardiac chest pain  If no changes, I anticipate pt can proceed with surgery as scheduled.   Rica Mastngela Matei Magnone, FNP-BC [facility]Anesthesiology Phone: [phone] 04/27/2016 1:58 PM

## 2016-05-02 MED ORDER — VANCOMYCIN HCL IN DEXTROSE 1-5 GM/200ML-% IV SOLN
1000.0000 mg | INTRAVENOUS | Status: AC
  Administered 2016-05-03: 1000 mg via INTRAVENOUS
  Filled 2016-05-02: qty 200

## 2016-05-03 ENCOUNTER — Inpatient Hospital Stay (HOSPITAL_COMMUNITY): Payer: Medicare Other | Admitting: Anesthesiology

## 2016-05-03 ENCOUNTER — Inpatient Hospital Stay (HOSPITAL_COMMUNITY): Payer: Medicare Other | Admitting: Emergency Medicine

## 2016-05-03 ENCOUNTER — Inpatient Hospital Stay (HOSPITAL_COMMUNITY)
Admission: RE | Admit: 2016-05-03 | Discharge: 2016-05-06 | DRG: 460 | Disposition: A | Discharge status: Skilled Nursing Facility | Payer: Medicare Other | Source: Ambulatory Visit | Attending: Neurosurgery | Admitting: Neurosurgery

## 2016-05-03 ENCOUNTER — Encounter (HOSPITAL_COMMUNITY): Payer: Self-pay | Admitting: *Deleted

## 2016-05-03 ENCOUNTER — Inpatient Hospital Stay (HOSPITAL_COMMUNITY): Payer: Medicare Other

## 2016-05-03 ENCOUNTER — Inpatient Hospital Stay (HOSPITAL_COMMUNITY)
Admission: RE | Disposition: A | Discharge status: Skilled Nursing Facility | Payer: Self-pay | Source: Ambulatory Visit | Attending: Neurosurgery

## 2016-05-03 DIAGNOSIS — G4730 Sleep apnea, unspecified: Secondary | ICD-10-CM | POA: Diagnosis present

## 2016-05-03 DIAGNOSIS — M4316 Spondylolisthesis, lumbar region: Principal | ICD-10-CM | POA: Diagnosis present

## 2016-05-03 DIAGNOSIS — I10 Essential (primary) hypertension: Secondary | ICD-10-CM | POA: Diagnosis present

## 2016-05-03 DIAGNOSIS — E039 Hypothyroidism, unspecified: Secondary | ICD-10-CM | POA: Diagnosis present

## 2016-05-03 DIAGNOSIS — M549 Dorsalgia, unspecified: Secondary | ICD-10-CM | POA: Diagnosis present

## 2016-05-03 DIAGNOSIS — M5116 Intervertebral disc disorders with radiculopathy, lumbar region: Secondary | ICD-10-CM | POA: Diagnosis present

## 2016-05-03 DIAGNOSIS — Z7902 Long term (current) use of antithrombotics/antiplatelets: Secondary | ICD-10-CM

## 2016-05-03 DIAGNOSIS — Z87891 Personal history of nicotine dependence: Secondary | ICD-10-CM

## 2016-05-03 DIAGNOSIS — Z419 Encounter for procedure for purposes other than remedying health state, unspecified: Secondary | ICD-10-CM

## 2016-05-03 HISTORY — PX: MAXIMUM ACCESS (MAS)POSTERIOR LUMBAR INTERBODY FUSION (PLIF) 2 LEVEL: SHX6369

## 2016-05-03 HISTORY — DX: Dizziness and giddiness: R42

## 2016-05-03 SURGERY — FOR MAXIMUM ACCESS (MAS) POSTERIOR LUMBAR INTERBODY FUSION (PLIF) 2 LEVEL
Anesthesia: General | Site: Back

## 2016-05-03 MED ORDER — CHLORHEXIDINE GLUCONATE CLOTH 2 % EX PADS: 6.0000 | MEDICATED_PAD | Freq: Once | CUTANEOUS | Status: DC

## 2016-05-03 MED ORDER — BUTALBITAL-APAP-CAFFEINE 50-325-40 MG PO TABS
1.0000 | ORAL_TABLET | Freq: Four times a day (QID) | ORAL | Status: DC | PRN
  Administered 2016-05-04 – 2016-05-05 (×4): 2 via ORAL
  Filled 2016-05-03 (×4): qty 2

## 2016-05-03 MED ORDER — NAPHAZOLINE-GLYCERIN 0.012-0.2 % OP SOLN: 1.0000 [drp] | Freq: Four times a day (QID) | OPHTHALMIC | Status: DC | PRN

## 2016-05-03 MED ORDER — ALUM & MAG HYDROXIDE-SIMETH 200-200-20 MG/5ML PO SUSP: 30.0000 mL | Freq: Four times a day (QID) | ORAL | Status: DC | PRN

## 2016-05-03 MED ORDER — SODIUM CHLORIDE 0.9% FLUSH
3.0000 mL | Freq: Two times a day (BID) | INTRAVENOUS | Status: DC
  Administered 2016-05-03 – 2016-05-04 (×2): 3 mL via INTRAVENOUS

## 2016-05-03 MED ORDER — SODIUM CHLORIDE 0.9% FLUSH: 3.0000 mL | INTRAVENOUS | Status: DC | PRN

## 2016-05-03 MED ORDER — LACTATED RINGERS IV SOLN
INTRAVENOUS | Status: DC
  Administered 2016-05-03: 10:00:00 via INTRAVENOUS

## 2016-05-03 MED ORDER — SENNOSIDES-DOCUSATE SODIUM 8.6-50 MG PO TABS: 1.0000 | ORAL_TABLET | Freq: Every evening | ORAL | Status: DC | PRN

## 2016-05-03 MED ORDER — METHOCARBAMOL 500 MG PO TABS
500.0000 mg | ORAL_TABLET | Freq: Four times a day (QID) | ORAL | Status: DC | PRN
  Administered 2016-05-03 – 2016-05-06 (×5): 500 mg via ORAL
  Filled 2016-05-03 (×5): qty 1

## 2016-05-03 MED ORDER — MIDAZOLAM HCL 5 MG/5ML IJ SOLN
INTRAMUSCULAR | Status: DC | PRN
  Administered 2016-05-03 (×2): 1 mg via INTRAVENOUS

## 2016-05-03 MED ORDER — BUPIVACAINE HCL (PF) 0.5 % IJ SOLN
INTRAMUSCULAR | Status: DC | PRN
Start: 2016-05-03 — End: 2016-05-03
  Administered 2016-05-03: 10 mL

## 2016-05-03 MED ORDER — SUCCINYLCHOLINE CHLORIDE 200 MG/10ML IV SOSY
PREFILLED_SYRINGE | INTRAVENOUS | Status: AC
  Filled 2016-05-03: qty 10

## 2016-05-03 MED ORDER — KCL IN DEXTROSE-NACL 20-5-0.45 MEQ/L-%-% IV SOLN: INTRAVENOUS | Status: DC

## 2016-05-03 MED ORDER — PHENOL 1.4 % MT LIQD: 1.0000 | OROMUCOSAL | Status: DC | PRN

## 2016-05-03 MED ORDER — DOCUSATE SODIUM 100 MG PO CAPS
100.0000 mg | ORAL_CAPSULE | Freq: Two times a day (BID) | ORAL | Status: DC
Start: 2016-05-03 — End: 2016-05-06
  Administered 2016-05-03 – 2016-05-06 (×6): 100 mg via ORAL
  Filled 2016-05-03 (×6): qty 1

## 2016-05-03 MED ORDER — LEVOTHYROXINE SODIUM 125 MCG PO TABS
125.0000 ug | ORAL_TABLET | Freq: Every day | ORAL | Status: DC
  Administered 2016-05-04 – 2016-05-06 (×3): 125 ug via ORAL
  Filled 2016-05-03 (×3): qty 1

## 2016-05-03 MED ORDER — BIOTIN 2.5 MG PO TABS: ORAL_TABLET | Freq: Every day | ORAL | Status: DC

## 2016-05-03 MED ORDER — SCOPOLAMINE 1 MG/3DAYS TD PT72
MEDICATED_PATCH | TRANSDERMAL | Status: AC
  Filled 2016-05-03: qty 1

## 2016-05-03 MED ORDER — VERAPAMIL HCL ER 240 MG PO CP24: 240.0000 mg | ORAL_CAPSULE | Freq: Every day | ORAL | Status: DC

## 2016-05-03 MED ORDER — BUPIVACAINE LIPOSOME 1.3 % IJ SUSP
INTRAMUSCULAR | Status: DC | PRN
  Administered 2016-05-03: 20 mL

## 2016-05-03 MED ORDER — THROMBIN 20000 UNITS EX KIT
PACK | CUTANEOUS | Status: DC | PRN
  Administered 2016-05-03: 20000 [IU] via TOPICAL

## 2016-05-03 MED ORDER — SUCCINYLCHOLINE CHLORIDE 200 MG/10ML IV SOSY
PREFILLED_SYRINGE | INTRAVENOUS | Status: DC | PRN
  Administered 2016-05-03: 100 mg via INTRAVENOUS

## 2016-05-03 MED ORDER — HYDROMORPHONE HCL 1 MG/ML IJ SOLN
INTRAMUSCULAR | Status: AC
  Filled 2016-05-03: qty 1

## 2016-05-03 MED ORDER — HEMOSTATIC AGENTS (NO CHARGE) OPTIME
TOPICAL | Status: DC | PRN
  Administered 2016-05-03: 1 via TOPICAL

## 2016-05-03 MED ORDER — ONDANSETRON HCL 4 MG/2ML IJ SOLN
INTRAMUSCULAR | Status: AC
  Filled 2016-05-03: qty 2

## 2016-05-03 MED ORDER — FAMOTIDINE IN NACL 20-0.9 MG/50ML-% IV SOLN
20.0000 mg | Freq: Two times a day (BID) | INTRAVENOUS | Status: DC
  Administered 2016-05-03: 20 mg via INTRAVENOUS
  Filled 2016-05-03 (×3): qty 50

## 2016-05-03 MED ORDER — EPHEDRINE SULFATE 50 MG/ML IJ SOLN
INTRAMUSCULAR | Status: DC | PRN
  Administered 2016-05-03: 15 mg via INTRAVENOUS
  Administered 2016-05-03: 5 mg via INTRAVENOUS
  Administered 2016-05-03 (×2): 10 mg via INTRAVENOUS

## 2016-05-03 MED ORDER — THROMBIN 5000 UNITS EX SOLR
OROMUCOSAL | Status: DC | PRN
  Administered 2016-05-03: 12:00:00 via TOPICAL

## 2016-05-03 MED ORDER — VERAPAMIL HCL ER 240 MG PO TBCR
240.0000 mg | EXTENDED_RELEASE_TABLET | Freq: Every day | ORAL | Status: DC
  Administered 2016-05-03 – 2016-05-05 (×3): 240 mg via ORAL
  Filled 2016-05-03 (×3): qty 1

## 2016-05-03 MED ORDER — PROMETHAZINE HCL 25 MG/ML IJ SOLN
6.2500 mg | INTRAMUSCULAR | Status: DC | PRN
  Administered 2016-05-03: 6.25 mg via INTRAVENOUS

## 2016-05-03 MED ORDER — LISINOPRIL 20 MG PO TABS
20.0000 mg | ORAL_TABLET | Freq: Every day | ORAL | Status: DC
  Administered 2016-05-06: 20 mg via ORAL
  Filled 2016-05-03: qty 1

## 2016-05-03 MED ORDER — VANCOMYCIN HCL 10 G IV SOLR
1250.0000 mg | Freq: Once | INTRAVENOUS | Status: AC
  Administered 2016-05-03: 1250 mg via INTRAVENOUS
  Filled 2016-05-03: qty 1250

## 2016-05-03 MED ORDER — ATORVASTATIN CALCIUM 20 MG PO TABS
40.0000 mg | ORAL_TABLET | Freq: Every day | ORAL | Status: DC
  Administered 2016-05-04 – 2016-05-05 (×2): 40 mg via ORAL
  Filled 2016-05-03 (×2): qty 2

## 2016-05-03 MED ORDER — CLOPIDOGREL BISULFATE 75 MG PO TABS
75.0000 mg | ORAL_TABLET | Freq: Every evening | ORAL | Status: DC
  Filled 2016-05-03 (×3): qty 1

## 2016-05-03 MED ORDER — 0.9 % SODIUM CHLORIDE (POUR BTL) OPTIME
TOPICAL | Status: DC | PRN
  Administered 2016-05-03: 1000 mL

## 2016-05-03 MED ORDER — METHOCARBAMOL 1000 MG/10ML IJ SOLN
500.0000 mg | Freq: Four times a day (QID) | INTRAVENOUS | Status: DC | PRN
  Filled 2016-05-03: qty 5

## 2016-05-03 MED ORDER — HYDROCHLOROTHIAZIDE 25 MG PO TABS
25.0000 mg | ORAL_TABLET | Freq: Every day | ORAL | Status: DC
  Administered 2016-05-06: 25 mg via ORAL
  Filled 2016-05-03: qty 1

## 2016-05-03 MED ORDER — HYDROMORPHONE HCL 1 MG/ML IJ SOLN
0.2500 mg | INTRAMUSCULAR | Status: DC | PRN
  Administered 2016-05-03 (×3): 0.5 mg via INTRAVENOUS

## 2016-05-03 MED ORDER — MORPHINE SULFATE (PF) 2 MG/ML IV SOLN
2.0000 mg | INTRAVENOUS | Status: DC | PRN
  Administered 2016-05-03 – 2016-05-04 (×3): 4 mg via INTRAVENOUS
  Administered 2016-05-04: 2 mg via INTRAVENOUS
  Filled 2016-05-03 (×3): qty 2
  Filled 2016-05-03: qty 1

## 2016-05-03 MED ORDER — MEPERIDINE HCL 25 MG/ML IJ SOLN: 6.2500 mg | INTRAMUSCULAR | Status: DC | PRN

## 2016-05-03 MED ORDER — LIDOCAINE 2% (20 MG/ML) 5 ML SYRINGE
INTRAMUSCULAR | Status: DC | PRN
  Administered 2016-05-03: 10 mg via INTRAVENOUS

## 2016-05-03 MED ORDER — ONDANSETRON HCL 4 MG/2ML IJ SOLN
INTRAMUSCULAR | Status: DC | PRN
  Administered 2016-05-03: 4 mg via INTRAVENOUS

## 2016-05-03 MED ORDER — LIDOCAINE-EPINEPHRINE 1 %-1:100000 IJ SOLN
INTRAMUSCULAR | Status: DC | PRN
  Administered 2016-05-03: 10 mL

## 2016-05-03 MED ORDER — MIDAZOLAM HCL 2 MG/2ML IJ SOLN: 0.5000 mg | Freq: Once | INTRAMUSCULAR | Status: DC | PRN

## 2016-05-03 MED ORDER — LISINOPRIL-HYDROCHLOROTHIAZIDE 20-12.5 MG PO TABS: 1.0000 | ORAL_TABLET | Freq: Every day | ORAL | Status: DC

## 2016-05-03 MED ORDER — SUFENTANIL CITRATE 50 MCG/ML IV SOLN
INTRAVENOUS | Status: DC | PRN
  Administered 2016-05-03: 5 ug via INTRAVENOUS
  Administered 2016-05-03 (×3): 10 ug via INTRAVENOUS

## 2016-05-03 MED ORDER — SODIUM CHLORIDE 0.9 % IV SOLN: 250.0000 mL | INTRAVENOUS | Status: DC

## 2016-05-03 MED ORDER — FLEET ENEMA 7-19 GM/118ML RE ENEM: 1.0000 | ENEMA | Freq: Once | RECTAL | Status: DC | PRN

## 2016-05-03 MED ORDER — EPHEDRINE SULFATE 50 MG/ML IJ SOLN
INTRAMUSCULAR | Status: AC
  Filled 2016-05-03: qty 1

## 2016-05-03 MED ORDER — BUPIVACAINE LIPOSOME 1.3 % IJ SUSP
20.0000 mL | INTRAMUSCULAR | Status: DC
  Filled 2016-05-03: qty 20

## 2016-05-03 MED ORDER — LIDOCAINE 2% (20 MG/ML) 5 ML SYRINGE
INTRAMUSCULAR | Status: AC
  Filled 2016-05-03: qty 5

## 2016-05-03 MED ORDER — PHENYLEPHRINE HCL 10 MG/ML IJ SOLN
INTRAMUSCULAR | Status: AC
  Filled 2016-05-03: qty 1

## 2016-05-03 MED ORDER — PROPOFOL 10 MG/ML IV BOLUS
INTRAVENOUS | Status: DC | PRN
  Administered 2016-05-03: 150 mg via INTRAVENOUS
  Administered 2016-05-03: 50 mg via INTRAVENOUS

## 2016-05-03 MED ORDER — SODIUM CHLORIDE 0.9 % IV SOLN
INTRAVENOUS | Status: DC | PRN
  Administered 2016-05-03: 13:00:00 via INTRAVENOUS

## 2016-05-03 MED ORDER — PHENYLEPHRINE HCL 10 MG/ML IJ SOLN
INTRAVENOUS | Status: DC | PRN
  Administered 2016-05-03: 40 ug/min via INTRAVENOUS

## 2016-05-03 MED ORDER — PROMETHAZINE HCL 25 MG/ML IJ SOLN
INTRAMUSCULAR | Status: AC
  Filled 2016-05-03: qty 1

## 2016-05-03 MED ORDER — PROPOFOL 10 MG/ML IV BOLUS
INTRAVENOUS | Status: AC
  Filled 2016-05-03: qty 20

## 2016-05-03 MED ORDER — BISACODYL 10 MG RE SUPP
10.0000 mg | Freq: Every day | RECTAL | Status: DC | PRN
Start: 2016-05-03 — End: 2016-05-06

## 2016-05-03 MED ORDER — ZOLPIDEM TARTRATE 5 MG PO TABS
5.0000 mg | ORAL_TABLET | Freq: Every evening | ORAL | Status: DC | PRN
Start: 2016-05-03 — End: 2016-05-06

## 2016-05-03 MED ORDER — SUFENTANIL CITRATE 50 MCG/ML IV SOLN
INTRAVENOUS | Status: AC
  Filled 2016-05-03: qty 1

## 2016-05-03 MED ORDER — MENTHOL 3 MG MT LOZG: 1.0000 | LOZENGE | OROMUCOSAL | Status: DC | PRN

## 2016-05-03 MED ORDER — MIDAZOLAM HCL 2 MG/2ML IJ SOLN
INTRAMUSCULAR | Status: AC
  Filled 2016-05-03: qty 2

## 2016-05-03 MED ORDER — ONDANSETRON HCL 4 MG/2ML IJ SOLN
4.0000 mg | INTRAMUSCULAR | Status: DC | PRN
  Administered 2016-05-04 (×2): 4 mg via INTRAVENOUS
  Filled 2016-05-03 (×2): qty 2

## 2016-05-03 MED ORDER — POLYVINYL ALCOHOL 1.4 % OP SOLN
1.0000 [drp] | OPHTHALMIC | Status: DC | PRN
  Filled 2016-05-03: qty 15

## 2016-05-03 MED ORDER — TRAMADOL HCL 50 MG PO TABS
50.0000 mg | ORAL_TABLET | Freq: Four times a day (QID) | ORAL | Status: DC | PRN
  Administered 2016-05-04: 50 mg via ORAL
  Filled 2016-05-03: qty 1

## 2016-05-03 SURGICAL SUPPLY — 94 items
ADH SKN CLS APL DERMABOND .7 (GAUZE/BANDAGES/DRESSINGS) ×1
APL SKNCLS STERI-STRIP NONHPOA (GAUZE/BANDAGES/DRESSINGS)
BENZOIN TINCTURE PRP APPL 2/3 (GAUZE/BANDAGES/DRESSINGS) IMPLANT
BLADE CLIPPER SURG (BLADE) IMPLANT
BONE CANC CHIPS 20CC PCAN1/4 (Bone Implant) ×3 IMPLANT
BONE MATRIX OSTEOCEL PRO MED (Bone Implant) ×2 IMPLANT
BUR MATCHSTICK NEURO 3.0 LAGG (BURR) ×3 IMPLANT
BUR PRECISION FLUTE 5.0 (BURR) ×2 IMPLANT
BUR ROUND FLUTED 5 RND (BURR) ×2 IMPLANT
BUR ROUND FLUTED 5MM RND (BURR) ×1
CAGE PLIF 8X9X23-12 LUMBAR (Cage) ×8 IMPLANT
CANISTER SUCT 3000ML PPV (MISCELLANEOUS) ×3 IMPLANT
CAP RELINE MOD TULIP RMM (Cap) ×4 IMPLANT
CHIPS CANC BONE 20CC PCAN1/4 (Bone Implant) ×1 IMPLANT
CLIP NEUROVISION LG (CLIP) ×2 IMPLANT
CLOSURE WOUND 1/2 X4 (GAUZE/BANDAGES/DRESSINGS) ×1
CONT SPEC 4OZ CLIKSEAL STRL BL (MISCELLANEOUS) ×3 IMPLANT
COVER BACK TABLE 24X17X13 BIG (DRAPES) IMPLANT
COVER BACK TABLE 60X90IN (DRAPES) ×3 IMPLANT
DECANTER SPIKE VIAL GLASS SM (MISCELLANEOUS) ×3 IMPLANT
DERMABOND ADVANCED (GAUZE/BANDAGES/DRESSINGS) ×2
DERMABOND ADVANCED .7 DNX12 (GAUZE/BANDAGES/DRESSINGS) ×1 IMPLANT
DRAPE C-ARM 42X72 X-RAY (DRAPES) ×3 IMPLANT
DRAPE C-ARMOR (DRAPES) ×3 IMPLANT
DRAPE LAPAROTOMY 100X72X124 (DRAPES) ×3 IMPLANT
DRAPE POUCH INSTRU U-SHP 10X18 (DRAPES) ×3 IMPLANT
DRAPE SURG 17X23 STRL (DRAPES) ×3 IMPLANT
DRSG OPSITE POSTOP 4X6 (GAUZE/BANDAGES/DRESSINGS) ×2 IMPLANT
DURAPREP 26ML APPLICATOR (WOUND CARE) ×3 IMPLANT
ELECT REM PT RETURN 9FT ADLT (ELECTROSURGICAL) ×3
ELECTRODE REM PT RTRN 9FT ADLT (ELECTROSURGICAL) ×1 IMPLANT
EVACUATOR 1/8 PVC DRAIN (DRAIN) IMPLANT
GAUZE SPONGE 4X4 12PLY STRL (GAUZE/BANDAGES/DRESSINGS) ×3 IMPLANT
GAUZE SPONGE 4X4 16PLY XRAY LF (GAUZE/BANDAGES/DRESSINGS) IMPLANT
GLOVE BIO SURGEON STRL SZ8 (GLOVE) ×8 IMPLANT
GLOVE BIOGEL PI IND STRL 7.0 (GLOVE) IMPLANT
GLOVE BIOGEL PI IND STRL 8 (GLOVE) ×2 IMPLANT
GLOVE BIOGEL PI IND STRL 8.5 (GLOVE) ×2 IMPLANT
GLOVE BIOGEL PI INDICATOR 7.0 (GLOVE) ×2
GLOVE BIOGEL PI INDICATOR 8 (GLOVE) ×10
GLOVE BIOGEL PI INDICATOR 8.5 (GLOVE) ×10
GLOVE ECLIPSE 8.0 STRL XLNG CF (GLOVE) ×8 IMPLANT
GLOVE EXAM NITRILE LRG STRL (GLOVE) IMPLANT
GLOVE EXAM NITRILE MD LF STRL (GLOVE) IMPLANT
GLOVE EXAM NITRILE XL STR (GLOVE) IMPLANT
GLOVE EXAM NITRILE XS STR PU (GLOVE) IMPLANT
GLOVE SURG SS PI 7.0 STRL IVOR (GLOVE) ×4 IMPLANT
GOWN STRL REUS W/ TWL LRG LVL3 (GOWN DISPOSABLE) IMPLANT
GOWN STRL REUS W/ TWL XL LVL3 (GOWN DISPOSABLE) ×3 IMPLANT
GOWN STRL REUS W/TWL 2XL LVL3 (GOWN DISPOSABLE) IMPLANT
GOWN STRL REUS W/TWL LRG LVL3 (GOWN DISPOSABLE)
GOWN STRL REUS W/TWL XL LVL3 (GOWN DISPOSABLE) ×9
GRAFT BNE CANC CHIPS 1-8 20CC (Bone Implant) IMPLANT
HEMOSTAT POWDER KIT SURGIFOAM (HEMOSTASIS) ×2 IMPLANT
KIT BASIN OR (CUSTOM PROCEDURE TRAY) ×3 IMPLANT
KIT POSITION SURG JACKSON T1 (MISCELLANEOUS) ×3 IMPLANT
KIT ROOM TURNOVER OR (KITS) ×3 IMPLANT
MILL MEDIUM DISP (BLADE) ×3 IMPLANT
MODULE NVM5 NEXT GEN EMG (NEEDLE) ×2 IMPLANT
NDL HYPO 21X1.5 SAFETY (NEEDLE) IMPLANT
NDL HYPO 25X1 1.5 SAFETY (NEEDLE) ×1 IMPLANT
NDL SPNL 18GX3.5 QUINCKE PK (NEEDLE) IMPLANT
NEEDLE HYPO 21X1.5 SAFETY (NEEDLE) ×3 IMPLANT
NEEDLE HYPO 25X1 1.5 SAFETY (NEEDLE) ×3 IMPLANT
NEEDLE SPNL 18GX3.5 QUINCKE PK (NEEDLE) IMPLANT
NS IRRIG 1000ML POUR BTL (IV SOLUTION) ×3 IMPLANT
PACK LAMINECTOMY NEURO (CUSTOM PROCEDURE TRAY) ×3 IMPLANT
PAD ARMBOARD 7.5X6 YLW CONV (MISCELLANEOUS) ×9 IMPLANT
PATTIES SURGICAL .25X.25 (GAUZE/BANDAGES/DRESSINGS) ×2 IMPLANT
PATTIES SURGICAL .5 X.5 (GAUZE/BANDAGES/DRESSINGS) ×2 IMPLANT
PATTIES SURGICAL .5 X1 (DISPOSABLE) IMPLANT
PATTIES SURGICAL 1X1 (DISPOSABLE) IMPLANT
ROD RELINE-O COCR 5.0X55MM (Rod) ×2 IMPLANT
SCREW 5X30MM POLYAXIAL 4S (Screw) ×2 IMPLANT
SCREW LOCK RSS 4.5/5.0MM (Screw) ×12 IMPLANT
SCREW RELINE RMM 5.5X35 4S (Screw) ×4 IMPLANT
SCREW SHANK RELINE MOD 5.0X35 (Screw) ×4 IMPLANT
SPONGE LAP 4X18 X RAY DECT (DISPOSABLE) IMPLANT
SPONGE SURGIFOAM ABS GEL 100 (HEMOSTASIS) ×3 IMPLANT
STAPLER SKIN PROX WIDE 3.9 (STAPLE) IMPLANT
STRIP CLOSURE SKIN 1/2X4 (GAUZE/BANDAGES/DRESSINGS) ×2 IMPLANT
SUT PROLENE 6 0 BV (SUTURE) ×2 IMPLANT
SUT VIC AB 1 CT1 18XBRD ANBCTR (SUTURE) ×2 IMPLANT
SUT VIC AB 1 CT1 8-18 (SUTURE) ×6
SUT VIC AB 2-0 CT1 18 (SUTURE) ×6 IMPLANT
SUT VIC AB 3-0 SH 8-18 (SUTURE) ×6 IMPLANT
SYR 20CC LL (SYRINGE) ×2 IMPLANT
SYR 3ML LL SCALE MARK (SYRINGE) ×12 IMPLANT
SYR 5ML LL (SYRINGE) IMPLANT
TOWEL OR 17X24 6PK STRL BLUE (TOWEL DISPOSABLE) ×3 IMPLANT
TOWEL OR 17X26 10 PK STRL BLUE (TOWEL DISPOSABLE) ×3 IMPLANT
TRAP SPECIMEN MUCOUS 40CC (MISCELLANEOUS) ×3 IMPLANT
TRAY FOLEY W/METER SILVER 16FR (SET/KITS/TRAYS/PACK) ×3 IMPLANT
WATER STERILE IRR 1000ML POUR (IV SOLUTION) ×3 IMPLANT

## 2016-05-03 NOTE — Transfer of Care (Signed)
Immediate Anesthesia Transfer of Care Note  Patient: Enid DerryLynn G Casciano  Procedure(s) Performed: Procedure(s) with comments: L4-5 L5-S1 Maximum access posterior lumbar interbody fusion (N/A) - L4-5 L5-S1 Maximum access posterior lumbar interbody fusion  Patient Location: PACU  Anesthesia Type:General  Level of Consciousness: awake, oriented and patient cooperative  Airway & Oxygen Therapy: Patient Spontanous Breathing and Patient connected to nasal cannula oxygen  Post-op Assessment: Report given to RN, Post -op Vital signs reviewed and stable and Patient moving all extremities  Post vital signs: Reviewed and stable  Last Vitals:  Vitals:   05/03/16 0921  BP: (!) 153/74  Pulse: 62  Resp: 20  Temp: 36.8 C    Last Pain:  Vitals:   05/03/16 0956  TempSrc:   PainSc: 8       Patients Stated Pain Goal: 3 (05/03/16 0956)  Complications: No apparent anesthesia complications

## 2016-05-03 NOTE — Progress Notes (Signed)
Consulted for vancomycin post-op.  Patient received pre-op vancomycin 1g at ~1100 this morning. No drain is in place.  CrCl ~65-10870mL/min.   Plan: -vancomycin 1250mg  IV x1 to be given at 2300 tonight -pharmacy to sign off as no more doses needed  Dasean Brow D. Kleo Dungee, PharmD, BCPS Clinical Pharmacist Pager: [phone] 05/03/2016 6:46 PM

## 2016-05-03 NOTE — Interval H&P Note (Signed)
History and Physical Interval Note:  05/03/2016 7:29 AM  Abigail [name]  has presented today for surgery, with the diagnosis of Spondylolisthesis, Lumbar region  The various methods of treatment have been discussed with the patient and family. After consideration of risks, benefits and other options for treatment, the patient has consented to  Procedure(s) with comments: L4-5 L5-S1 Maximum access posterior lumbar interbody fusion (N/A) - L4-5 L5-S1 Maximum access posterior lumbar interbody fusion as a surgical intervention .  The patient's history has been reviewed, patient examined, no change in status, stable for surgery.  I have reviewed the patient's chart and labs.  Questions were answered to the patient's satisfaction.     Alegra Rost D

## 2016-05-03 NOTE — Op Note (Signed)
05/03/2016  3:57 PM  PATIENT:  Abigail [name]  70 y.o. female  PRE-OPERATIVE DIAGNOSIS:  Spondylolisthesis, Lumbar region, herniated lumbar disc, stenosis, radiculopathy L 4 5 and L 5 S 1 levels  POST-OPERATIVE DIAGNOSIS:  Spondylolisthesis, Lumbar region, herniated lumbar disc, stenosis, radiculopathy L 4 5 and L 5 S 1 levels  PROCEDURE:  Procedure(s) with comments: L4-5 L5-S1 Maximum access posterior lumbar interbody fusion (N/A) - L4-5 L5-S1 Maximum access posterior lumbar interbody fusion with PEEK cages, autograft, allograft, pedicle screw fixation, posterolateral arthrodesis   Decompression greater than for standard PLIF procedure  SURGEON:  Surgeon(s) and Role:    * Maeola HarmanJoseph Lavell Supple, MD - Primary    * Barnett AbuHenry Elsner, MD - Assisting  PHYSICIAN ASSISTANT:   ASSISTANTS: Poteat, RN   ANESTHESIA:   general  EBL:  Total I/O In: 1800 [I.V.:1800] Out: 800 [Urine:500; Blood:300]  BLOOD ADMINISTERED:none  DRAINS: none   LOCAL MEDICATIONS USED:  MARCAINE    and LIDOCAINE   SPECIMEN:  No Specimen  DISPOSITION OF SPECIMEN:  N/A  COUNTS:  YES  TOURNIQUET:  * No tourniquets in log *  DICTATION: DICTATION: Patient is a 70 year old female with spondylolisthesis , stenosis, disc herniation and severe back and bilateral lower extremity pain at L4/5 and L 5 S 1 levels of the lumbar spine.She has not improved with extensive conservative management.  It was elected to take her to surgery for MASPLIF at  L 45 and L 5 S 1  levels with posterolateral arthrodesis.  Procedure:   Following uncomplicated induction of GETA, and placement of electrodes for neural monitoring, patient was turned into a prone position on the [place] tableand using AP  fluoroscopy the area of planned incision was marked, prepped with betadine scrub and Duraprep, then draped. Exposure was performed of facet joint complex at L 45 and L 5 S 1 levels and the MAS retractor was placed.5.0 x 35 mm cortical Nuvasive screws were  placed at L 4 bilaterally according to standard landmarks using neural monitoring.  A total laminectomy of L 4 and L 5 was then performed with disarticulation of facets.  Decompression was greater than for standard PLIF procedure and thorough decompression of the thecal sac, bilateral L 4, L 5, S1 nerve roots was performed. Along with foraminal and extraforaminal portions of these nerve roots.  This bone was saved for grafting, combined with Osteocel after being run through bone mill and was placed in bone packing device.  A small midline durotomy was created, which was closed with a running 6-0 Prolene stitch.  There was no leakage of CSF.    Thorough discectomy was performed bilaterally at L 5 S 1  and the endplates were prepared for grafting.  23 x 8 x 12 degree cages were placed in the interspace and positioning was confirmed with AP and lateral fluoroscopy.  10 cc of autograft/Osteocel was packed in the interspace medial to the second cage.   Thorough discectomy was performed bilaterally at L 45  and the endplates were prepared for grafting.  23 x 8 x 12 degree cages were placed in the interspace and positioning was confirmed with AP and lateral fluoroscopy.  10 cc of autograft/Osteocel was packed in the interspace medial to the second cage.   Remaining screws were placed at L 5 (5.0 x 30mm, note these pedicles were dysplastic) and and S 1 (5.5 x 35 mm) and 55 mm rods were placed. And the screws were locked and torqued.Final Xrays showed well positioned  implants and screw fixation. The posterolateral region on the left was packed with remaining 20 cc of autograft on the left of midline and 10 cc on the right. The wounds were irrigated and then closed with 1, 2-0 and 3-0 Vicryl stitches. 20 cc long-acting Marcaine was injected into the musculature.  Sterile occlusive dressing was placed with Dermabond and an occlusive dressing. The patient was then extubated in the operating room and taken to recovery in stable  and satisfactory condition having tolerated her operation well. Counts were correct at the end of the case.  PLAN OF CARE: Admit to inpatient   PATIENT DISPOSITION:  PACU - hemodynamically stable.   Delay start of Pharmacological VTE agent (>24hrs) due to surgical blood loss or risk of bleeding: yes

## 2016-05-03 NOTE — H&P (Signed)
Patient ID:   [id number] Patient: Abigail [name]  Date of Birth: 09/04/1946 Visit Type: Office Visit   Date: 03/21/2016 02:30 PM Provider: Danae OrleansJoseph D. Venetia MaxonStern MD   This 70 year old female presents for back pain.  History of Present Illness: 1.  back pain    Patient visits today in follow-up to Medrol Dosepak begun on Friday.  She reports only one day of pain decrease.  She completes her Dosepak tomorrow.  She has stopped tramadol as it offered no relief.  She does not tolerate opiates.  X-rays on [place]  The patient has hip abductor weakness on the left along with dorsiflexion and EHL weakness on the left at 4 out of 5.  She has decreased pin sensation in the left L4 distribution and increased pinprick in the left L5 distribution.  She has a markedly positive straight leg raise on the left.  Patient's physical exam findings and her prior complaints and I explained to the patient that she continues to demonstrate signs of an L5 radiculopathy on the left and has significant stenosis and spondylosis with spondylolisthesis at L4 L5 and L5-S1 levels.  As per my previous recommendation I have recommended the patient undergo decompression and fusion at the L4 L5 and L5-S1 levels.  She has severe pain and unfortunately for the patient she is not able to tolerate narcotic analgesics.  We will going to see if she does okay with morphine and given her prescription for that.  If she does not have relief with that we will consider Nucynta.      Medical/Surgical/Interim History Reviewed, no change.  Last detailed document date:03/09/2016.   PAST MEDICAL HISTORY, SURGICAL HISTORY, FAMILY HISTORY, SOCIAL HISTORY AND REVIEW OF SYSTEMS I have reviewed the patient's past medical, surgical, family and social history as well as the comprehensive review of systems as included on the [facility] history form dated 03/09/2016, which I have signed.  Family History: Reviewed, no  changes.  Last detailed document: 03/09/2016.   Social History: Tobacco use reviewed. Reviewed, no changes. Last detailed document date: 03/09/2016.      MEDICATIONS(added, continued or stopped this visit): Started Medication Directions Instruction Stopped   diclofenac 75 mg ORAL Take 1 tablet by mouth once daily     Fioricet 50 mg-300 mg-40 mg capsule take 1 capsule by oral route  every 4 hours as needed not to exceed 6 capsules per 24hrs     levothyroxine 125 mcg tablet take 1 tablet by oral route  every day     Lipitor 40 mg tablet take 1 tablet by oral route  every day     lisinopril 20 mg-hydrochlorothiazide 12.5 mg tablet take 1 tablet by oral route  every day    03/17/2016 Medrol (Pak) 4 mg tablets in a dose pack take by Oral route as directed    03/21/2016 morphine 15 mg immediate release tablet Take 1 tab q 4-6 hours prn pain     Plavix 75 mg tablet take 1 tablet by oral route  every day     tramadol 50 mg tablet take 1 tablet by oral route  every 6 hours as needed     verapamil ER (SR) 240 mg tablet,extended release take 1 tablet by oral route  every day with food       ALLERGIES: Ingredient Reaction Medication Name Comment  HYDROCODONE BITARTRATE  Vicodin   DYE     ACETAMINOPHEN  Vicodin   OXYCODONE HCL  Percocet  ACETAMINOPHEN  Percocet   CODEINE Unknown    OPIOIDS - MORPHINE ANALOGUES         Vitals Date Temp F BP Pulse Ht In Wt Lb BMI BSA Pain Score  03/21/2016  162/87 65 65 179 29.79  10/10      IMPRESSION Persistent L5 radiculopathy with weakness with spondylolisthesis and stenosis L4 L5 and L5-S1 levels  Completed Orders (this encounter) Order Details Reason Side Interpretation Result Initial Treatment Date Region  Hypertension education Patient to f/u primary care provider        Lifestyle education regarding diet Patient encourage to eat a well balance diet         Assessment/Plan # Detail Type Description   1. Assessment Spinal stenosis of  lumbar region (M48.06).       2. Assessment Low back pain, unspecified back pain laterality, with sciatica presence unspecified (M54.5).       3. Assessment Radiculopathy, lumbar region (M54.16).       4. Assessment Essential (primary) hypertension (I10).       5. Assessment Body mass index (BMI) 29.0-29.9, adult (Z61.09(Z68.29).   Plan Orders Today's instructions / counseling include(s) Lifestyle education regarding diet.       6. Assessment Spondylolisthesis, lumbar region (M43.16).         Pain Assessment/Treatment Pain Scale: 10/10. Method: Numeric Pain Intensity Scale. Location: back. Onset: 09/08/2016. Duration: varies. Quality: discomforting. Pain Assessment/Treatment follow-up plan of care: Patient taken medication as prescribed.  Fall Risk Plan The Patient has fallen 1 times in the last year. The fall(s) resulted in injury. Details: tripped and fell hit to the head. Falls risk follow-up plan of care: Assisted devices: Advised to use safety measures when available.  Proceed with decompression and fusion L4 L5 and L5-S1 levels.  Risks and benefits were discussed in detail with patient and she wishes to proceed.  She was fitted for an LSO brace today.  She was given a pressure pushing for morphine to see if this will help with her discomfort.  Orders: Diagnostic Procedures: Assessment Procedure  M43.16 MAS PLIF L4-L5 - L5-S1  M54.16 Lumbar Spine- AP/Lat  M54.16 Return to Clinic  Instruction(s)/Education: Assessment Instruction  I10 Hypertension education  Z68.29 Lifestyle education regarding diet    MEDICATIONS PRESCRIBED TODAY    Rx Quantity Refills  MORPHINE SULFATE 15 mg  60 0            Provider:  Danae OrleansJoseph D. Venetia MaxonStern MD  03/26/2016 04:09 PM Dictation edited by: Danae OrleansJoseph D. Venetia MaxonStern    CC Providers: Eleonore ChiquitoPeter Kwiatkowski [facility] [address] [place], [place] [address]-              Electronically signed by Danae OrleansJoseph D. Venetia MaxonStern MD on  03/26/2016 04:09 PM

## 2016-05-03 NOTE — Anesthesia Preprocedure Evaluation (Signed)
Anesthesia Evaluation  Patient identified by MRN, date of birth, ID band Patient awake    Reviewed: Allergy & Precautions, NPO status , Patient's Chart, lab work & pertinent test results  History of Anesthesia Complications (+) PONV and history of anesthetic complications  Airway Mallampati: I  TM Distance: >3 FB Neck ROM: Full    Dental  (+) Missing, Chipped, Dental Advisory Given   Pulmonary sleep apnea (does not use CPAP) , former smoker (quit 1970),    breath sounds clear to auscultation       Cardiovascular hypertension, Pt. on medications (-) angina Rhythm:Regular Rate:Normal  '16 cath: very mild non-obstructive disease, normal LVF   Neuro/Psych Chronic back pain: tramadol TIACVA (gait and balance issues), Residual Symptoms    GI/Hepatic negative GI ROS, Neg liver ROS,   Endo/Other  Hypothyroidism   Renal/GU negative Renal ROS     Musculoskeletal  (+) Arthritis , Osteoarthritis,    Abdominal   Peds  Hematology negative hematology ROS (+)   Anesthesia Other Findings   Reproductive/Obstetrics                             Anesthesia Physical Anesthesia Plan  ASA: III  Anesthesia Plan: General   Post-op Pain Management:    Induction: Intravenous  Airway Management Planned: Oral ETT  Additional Equipment:   Intra-op Plan:   Post-operative Plan: Extubation in OR  Informed Consent: I have reviewed the patients History and Physical, chart, labs and discussed the procedure including the risks, benefits and alternatives for the proposed anesthesia with the patient or authorized representative who has indicated his/her understanding and acceptance.   Dental advisory given  Plan Discussed with: CRNA and Surgeon  Anesthesia Plan Comments: (Plan routine monitors, GETA)        Anesthesia Quick Evaluation

## 2016-05-03 NOTE — Brief Op Note (Signed)
05/03/2016  3:57 PM  PATIENT:  Abigail [name]  70 y.o. female  PRE-OPERATIVE DIAGNOSIS:  Spondylolisthesis, Lumbar region, herniated lumbar disc, stenosis, radiculopathy L 4 5 and L 5 S 1 levels  POST-OPERATIVE DIAGNOSIS:  Spondylolisthesis, Lumbar region, herniated lumbar disc, stenosis, radiculopathy L 4 5 and L 5 S 1 levels  PROCEDURE:  Procedure(s) with comments: L4-5 L5-S1 Maximum access posterior lumbar interbody fusion (N/A) - L4-5 L5-S1 Maximum access posterior lumbar interbody fusion with PEEK cages, autograft, allograft, pedicle screw fixation, posterolateral arthrodesis   Decompression greater than for standard PLIF procedure  SURGEON:  Surgeon(s) and Role:    * Jousha Schwandt, MD - Primary    * Henry Elsner, MD - Assisting  PHYSICIAN ASSISTANT:   ASSISTANTS: Poteat, RN   ANESTHESIA:   general  EBL:  Total I/O In: 1800 [I.V.:1800] Out: 800 [Urine:500; Blood:300]  BLOOD ADMINISTERED:none  DRAINS: none   LOCAL MEDICATIONS USED:  MARCAINE    and LIDOCAINE   SPECIMEN:  No Specimen  DISPOSITION OF SPECIMEN:  N/A  COUNTS:  YES  TOURNIQUET:  * No tourniquets in log *  DICTATION: DICTATION: Patient is a 70-year-old female with spondylolisthesis , stenosis, disc herniation and severe back and bilateral lower extremity pain at L4/5 and L 5 S 1 levels of the lumbar spine.She has not improved with extensive conservative management.  It was elected to take her to surgery for MASPLIF at  L 45 and L 5 S 1  levels with posterolateral arthrodesis.  Procedure:   Following uncomplicated induction of GETA, and placement of electrodes for neural monitoring, patient was turned into a prone position on the Jackson tableand using AP  fluoroscopy the area of planned incision was marked, prepped with betadine scrub and Duraprep, then draped. Exposure was performed of facet joint complex at L 45 and L 5 S 1 levels and the MAS retractor was placed.5.0 x 35 mm cortical Nuvasive screws were  placed at L 4 bilaterally according to standard landmarks using neural monitoring.  A total laminectomy of L 4 and L 5 was then performed with disarticulation of facets.  Decompression was greater than for standard PLIF procedure and thorough decompression of the thecal sac, bilateral L 4, L 5, S1 nerve roots was performed. Along with foraminal and extraforaminal portions of these nerve roots.  This bone was saved for grafting, combined with Osteocel after being run through bone mill and was placed in bone packing device.  A small midline durotomy was created, which was closed with a running 6-0 Prolene stitch.  There was no leakage of CSF.    Thorough discectomy was performed bilaterally at L 5 S 1  and the endplates were prepared for grafting.  23 x 8 x 12 degree cages were placed in the interspace and positioning was confirmed with AP and lateral fluoroscopy.  10 cc of autograft/Osteocel was packed in the interspace medial to the second cage.   Thorough discectomy was performed bilaterally at L 45  and the endplates were prepared for grafting.  23 x 8 x 12 degree cages were placed in the interspace and positioning was confirmed with AP and lateral fluoroscopy.  10 cc of autograft/Osteocel was packed in the interspace medial to the second cage.   Remaining screws were placed at L 5 (5.0 x 30mm, note these pedicles were dysplastic) and and S 1 (5.5 x 35 mm) and 55 mm rods were placed. And the screws were locked and torqued.Final Xrays showed well positioned   implants and screw fixation. The posterolateral region on the left was packed with remaining 20 cc of autograft on the left of midline and 10 cc on the right. The wounds were irrigated and then closed with 1, 2-0 and 3-0 Vicryl stitches. 20 cc long-acting Marcaine was injected into the musculature.  Sterile occlusive dressing was placed with Dermabond and an occlusive dressing. The patient was then extubated in the operating room and taken to recovery in stable  and satisfactory condition having tolerated her operation well. Counts were correct at the end of the case.  PLAN OF CARE: Admit to inpatient   PATIENT DISPOSITION:  PACU - hemodynamically stable.   Delay start of Pharmacological VTE agent (>24hrs) due to surgical blood loss or risk of bleeding: yes

## 2016-05-03 NOTE — Anesthesia Postprocedure Evaluation (Signed)
Anesthesia Post Note  Patient: Enid DerryLynn G Reason  Procedure(s) Performed: Procedure(s) (LRB): L4-5 L5-S1 Maximum access posterior lumbar interbody fusion (N/A)  Patient location during evaluation: PACU Anesthesia Type: General Level of consciousness: sedated, patient cooperative and oriented Pain management: pain level controlled Vital Signs Assessment: post-procedure vital signs reviewed and stable Respiratory status: spontaneous breathing, nonlabored ventilation, respiratory function stable and patient connected to nasal cannula oxygen Cardiovascular status: blood pressure returned to baseline and stable : nausea improved. Anesthetic complications: no    Last Vitals:  Vitals:   05/03/16 1600 05/03/16 1615  BP: 100/65 111/68  Pulse: 83 83  Resp: 15 (!) 23  Temp: 36.9 C     Last Pain:  Vitals:   05/03/16 1600  TempSrc:   PainSc: 3                  Taren Toops,E. Kileen Lange

## 2016-05-03 NOTE — Progress Notes (Signed)
Awake, alert, conversant.  MAEW with good strength.  Doing well. 

## 2016-05-03 NOTE — Anesthesia Procedure Notes (Signed)
Procedure Name: Intubation Date/Time: 05/03/2016 11:12 AM Performed by: Charm BargesBUTLER, Amina Menchaca R Pre-anesthesia Checklist: Patient identified, Emergency Drugs available, Suction available and Patient being monitored Patient Re-evaluated:Patient Re-evaluated prior to inductionOxygen Delivery Method: Circle System Utilized Preoxygenation: Pre-oxygenation with 100% oxygen Intubation Type: IV induction Ventilation: Mask ventilation without difficulty Laryngoscope Size: Mac and 3 Grade View: Grade I Tube type: Oral Tube size: 7.0 mm Number of attempts: 1 Airway Equipment and Method: Stylet and Oral airway Placement Confirmation: ETT inserted through vocal cords under direct vision,  positive ETCO2 and breath sounds checked- equal and bilateral Secured at: 22 cm Tube secured with: Tape Dental Injury: Teeth and Oropharynx as per pre-operative assessment

## 2016-05-04 MED ORDER — PROMETHAZINE HCL 25 MG PO TABS
12.5000 mg | ORAL_TABLET | Freq: Four times a day (QID) | ORAL | Status: DC | PRN
  Administered 2016-05-04 – 2016-05-05 (×2): 25 mg via ORAL
  Filled 2016-05-04 (×3): qty 1

## 2016-05-04 MED ORDER — TRAMADOL HCL 50 MG PO TABS
100.0000 mg | ORAL_TABLET | Freq: Four times a day (QID) | ORAL | Status: DC | PRN
Start: 2016-05-04 — End: 2016-05-05
  Administered 2016-05-04 – 2016-05-05 (×3): 100 mg via ORAL
  Filled 2016-05-04 (×3): qty 2

## 2016-05-04 MED ORDER — FAMOTIDINE 20 MG PO TABS
20.0000 mg | ORAL_TABLET | Freq: Two times a day (BID) | ORAL | Status: DC
  Administered 2016-05-04 – 2016-05-06 (×4): 20 mg via ORAL
  Filled 2016-05-04 (×4): qty 1

## 2016-05-04 NOTE — Progress Notes (Signed)
Subjective: Patient reports sore in back.  Legs better.  Objective: Vital signs in last 24 hours: Temp:  [97.8 F (36.6 C)-99.3 F (37.4 C)] 99.3 F (37.4 C) (08/16 0310) Pulse Rate:  [62-83] 72 (08/16 0310) Resp:  [15-23] 18 (08/16 0310) BP: (94-153)/(52-89) 95/52 (08/16 0310) SpO2:  [94 %-100 %] 97 % (08/16 0310) Weight:  [82.1 kg (181 lb)] 82.1 kg (181 lb) (08/15 0921)  Intake/Output from previous day: 08/15 0701 - 08/16 0700 In: 2200 [I.V.:2200] Out: 1250 [Urine:950; Blood:300] Intake/Output this shift: No intake/output data recorded.  Physical Exam: MAEW.  Dressing CDI.  Strength full.  Lab Results: No results for input(s): WBC, HGB, HCT, PLT in the last 72 hours. BMET No results for input(s): NA, K, CL, CO2, GLUCOSE, BUN, CREATININE, CALCIUM in the last 72 hours.  Studies/Results: Dg Lumbar Spine 2-3 Views  Result Date: 05/03/2016 CLINICAL DATA:  Surgery at L4-5 and L5-S1 FLUOROSCOPY TIME:  74 seconds. Images: 2 EXAM: LUMBAR SPINE - 2-3 VIEW; DG C-ARM 61-120 MIN COMPARISON:  None. FINDINGS: Pedicle screws have been placed at L4, L5, and S1 by the end of the study with disc spacer devices at L4-5 and L5-S1. The hardware appears to be in good position. The L4 screws approaches the superior endplate but do not appear to extend through the end plate. IMPRESSION: Surgery at L4-5 and L5-S1 as above. Electronically Signed   By: Gerome Samavid  Williams III M.D   On: 05/03/2016 15:49   Dg C-arm 61-120 Min  Result Date: 05/03/2016 CLINICAL DATA:  Surgery at L4-5 and L5-S1 FLUOROSCOPY TIME:  74 seconds. Images: 2 EXAM: LUMBAR SPINE - 2-3 VIEW; DG C-ARM 61-120 MIN COMPARISON:  None. FINDINGS: Pedicle screws have been placed at L4, L5, and S1 by the end of the study with disc spacer devices at L4-5 and L5-S1. The hardware appears to be in good position. The L4 screws approaches the superior endplate but do not appear to extend through the end plate. IMPRESSION: Surgery at L4-5 and L5-S1 as  above. Electronically Signed   By: Gerome Samavid  Williams III M.D   On: 05/03/2016 15:49    Assessment/Plan: Doing well POD 1.  Mobilize.  D/C Foley.    LOS: 1 day    Dorian HeckleSTERN,Arrabella Westerman D, MD 05/04/2016, 7:28 AM

## 2016-05-04 NOTE — Evaluation (Signed)
Physical Therapy Evaluation Patient Details Name: Abigail [name] MRN: [id number] DOB: 02/27/1946 Today's Date: 05/04/2016   History of Present Illness  70 yo s/p PLIF L4-5 L5- S1 PMH: HTN , CVA 04/2015, TIA, PFO, OSA, s/p ventral hernia repair 3/15, s/p sigmoid colo resection, RSO 12/12  Clinical Impression  Patient presents with decreased independence with mobility due to deficits listed in PT problem list.  She will benefit from skilled PT in the acute setting to allow return home with spouse assist and follow up HHPT.  OT noted some nystagmus upon coming upright so may need vestibular treatment when able to tolerate.  Too painful and nauseated this session.     Follow Up Recommendations Home health PT    Equipment Recommendations  Rolling walker with 5" wheels;3in1 (PT)    Recommendations for Other Services       Precautions / Restrictions Precautions Precautions: Back Precaution Comments: back precautions/ back handout provided Required Braces or Orthoses: Spinal Brace Spinal Brace: Lumbar corset;Applied in sitting position      Mobility  Bed Mobility Overal bed mobility: Needs Assistance Bed Mobility: Sit to Sidelying Rolling: Min assist   Supine to sit: Mod assist   Sit to sidelying: Min assist General bed mobility comments: assist for legs into bed; patient able to keep eyes open and noted no nystagmus or c/o dizziness  Transfers Overall transfer level: Needs assistance Equipment used: Rolling walker (2 wheeled) Transfers: Sit to/from Stand Sit to Stand: Mod assist         General transfer comment: spouse in the room and assisting to stand from recliner  Ambulation/Gait Ambulation/Gait assistance: Min assist;Mod assist Ambulation Distance (Feet): 75 Feet Assistive device: Rolling walker (2 wheeled) Gait Pattern/deviations: Step-through pattern;Decreased stride length;Shuffle     General Gait Details: slow pace, cues and assist with side stepping by the bed in  the room in narrow space, assist to maneuver walker on turns  Stairs            Wheelchair Mobility    Modified Rankin (Stroke Patients Only)       Balance Overall balance assessment: Needs assistance Sitting-balance support: Bilateral upper extremity supported;Feet supported Sitting balance-Leahy Scale: Fair     Standing balance support: Single extremity supported;During functional activity Standing balance-Leahy Scale: Poor Standing balance comment: UE support for balance, but can stand breifly unaided                             Pertinent Vitals/Pain Pain Assessment: Faces Pain Score: 10-Worst pain ever Faces Pain Scale: Hurts worst Pain Location: R back and L leg behind knee Pain Descriptors / Indicators: Aching;Discomfort Pain Intervention(s): Monitored during session;Limited activity within patient's tolerance;Premedicated before session    Home Living Family/patient expects to be discharged to:: Private residence Living Arrangements: Spouse/significant other Available Help at Discharge: Family;Available 24 hours/day Type of Home: House Home Access: Stairs to enter Entrance Stairs-Rails: None Entrance Stairs-Number of Steps: 3 Home Layout: Two level;Able to live on main level with bedroom/bathroom Home Equipment: None Additional Comments: drives    Prior Function Level of Independence: Independent         Comments: husband is retired and can provided 24/7     Hand Dominance   Dominant Hand: Right    Extremity/Trunk Assessment   Upper Extremity Assessment: Defer to OT evaluation           Lower Extremity Assessment: Generalized weakness   LLE Deficits /  Details: shooting pain  Cervical / Trunk Assessment: Other exceptions  Communication   Communication: No difficulties  Cognition Arousal/Alertness: Awake/alert Behavior During Therapy: WFL for tasks assessed/performed Overall Cognitive Status: Within Functional Limits for  tasks assessed                      General Comments General comments (skin integrity, edema, etc.): noted no nystagmus or dizziness when returning to bed lying on L side with HOB about 30 degrees; N&V prior to session and recieved nausea medication, min nausea during session    Exercises        Assessment/Plan    PT Assessment Patient needs continued PT services  PT Diagnosis Difficulty walking;Acute pain   PT Problem List Decreased strength;Decreased balance;Pain;Decreased activity tolerance;Decreased mobility;Decreased knowledge of use of DME;Decreased knowledge of precautions  PT Treatment Interventions DME instruction;Therapeutic activities;Gait training;Functional mobility training;Stair training;Therapeutic exercise;Balance training;Patient/family education   PT Goals (Current goals can be found in the Care Plan section) Acute Rehab PT Goals Patient Stated Goal: To feel better PT Goal Formulation: With patient/family Time For Goal Achievement: 05/11/16 Potential to Achieve Goals: Good    Frequency Min 5X/week   Barriers to discharge        Co-evaluation               End of Session Equipment Utilized During Treatment: Back brace Activity Tolerance: Patient limited by fatigue;Patient limited by pain Patient left: in bed;with call bell/phone within reach;with family/visitor present           Time: 0900-0921 PT Time Calculation (min) (ACUTE ONLY): 21 min   Charges:   PT Evaluation $PT Eval Moderate Complexity: 1 Procedure     PT G CodesElray [name]:        Abigail [name] 05/04/2016, 10:02 AM Abigail [name], PT [phone] 05/04/2016

## 2016-05-04 NOTE — Evaluation (Signed)
Occupational Therapy Evaluation Patient Details Name: Abigail [name] MRN: [id number] DOB: 08/04/1946 Today's Date: 05/04/2016    History of Present Illness 70 yo s/p PLIF L4-5 L5- S1 PMH: HTN , CVA 04/2015, TIA, PFO, OSA, s/p ventral hernia repair 3/15, s/p sigmoid colo resection, RSO 12/12   Clinical Impression   Patient is s/p L4-5 L5-s1 surgery resulting in functional limitations due to the deficits listed below (see OT problem list). PTA was independent at home.  Patient will benefit from skilled OT acutely to increase independence and safety with ADLS to allow discharge SNF. Pt greatly limited this session by pain and vestibular deficits ( BPPV). Pt reports history of 5 days of dizziness and previous vestibular deficits that resolved prior to vestibular evaluation.   PT contacted regarding vestibular deficits and pending assessment / treatment.      .Follow Up Recommendations  SNF (possible need for HHOT if progressing)    Equipment Recommendations  3 in 1 bedside comode    Recommendations for Other Services       Precautions / Restrictions Precautions Precautions: Back Precaution Comments: back precautions/ back handout provided Required Braces or Orthoses: Spinal Brace Spinal Brace: Lumbar corset;Applied in sitting position      Mobility Bed Mobility Overal bed mobility: Needs Assistance Bed Mobility: Rolling;Supine to Sit Rolling: Min assist   Supine to sit: Mod assist     General bed mobility comments: Pt exiting bed L side like at home with immediate spinning feeling. pt educated on gaze stabilization in sitting  Transfers Overall transfer level: Needs assistance   Transfers: Sit to/from Stand Sit to Stand: Mod assist         General transfer comment: pt required hand held (A) and attempting to reach for wall. pt needed cues to avoid reaching. pt could benefit from RW for next attempt    Balance Overall balance assessment: Needs  assistance Sitting-balance support: Bilateral upper extremity supported;Feet supported Sitting balance-Leahy Scale: Poor     Standing balance support: Single extremity supported;During functional activity Standing balance-Leahy Scale: Zero                              ADL Overall ADL's : Needs assistance/impaired Eating/Feeding: Set up;Sitting       Upper Body Bathing: Moderate assistance;Sitting   Lower Body Bathing: Maximal assistance   Upper Body Dressing : Total assistance Upper Body Dressing Details (indicate cue type and reason): total (A) to don brace this sessino due to visual attention and bil Ue used for balance     Toilet Transfer: Moderate assistance;Stand-pivot Toilet Transfer Details (indicate cue type and reason): requires cues not to reach of environmental supports           General ADL Comments: Pt limited by pain and reviewed back precautions. Ot will return to attempt further education of back precautions. pt limited by vertigo that appears to be BPPV in nature. L beating lasting less than 30 seconds. OT contacted PT vestibular specialist to help Assess     Vision     Perception     Praxis      Pertinent Vitals/Pain Pain Assessment: Faces Pain Score: 10-Worst pain ever Faces Pain Scale: Hurts worst Pain Location: back/ L hamstring area Pain Descriptors / Indicators: Constant;Discomfort Pain Intervention(s): Repositioned;Monitored during session;Premedicated before session;Limited activity within patient's tolerance     Hand Dominance Right   Extremity/Trunk Assessment Upper Extremity Assessment Upper Extremity Assessment: Overall [facility]  for tasks assessed   Lower Extremity Assessment Lower Extremity Assessment: Defer to PT evaluation;LLE deficits/detail LLE Deficits / Details: shooting pain   Cervical / Trunk Assessment Cervical / Trunk Assessment: Other exceptions (s/p surg)   Communication Communication Communication: No  difficulties   Cognition Arousal/Alertness: Awake/alert Behavior During Therapy: WFL for tasks assessed/performed Overall Cognitive Status: Within Functional Limits for tasks assessed                     General Comments       Exercises       Shoulder Instructions      Home Living Family/patient expects to be discharged to:: Private residence Living Arrangements: Spouse/significant other Available Help at Discharge: Family;Available 24 hours/day Type of Home: House Home Access: Stairs to enter Enter[employer] CorporationEntrance Stairs-Number of Steps: 3 Entrance Stairs-Rails: None Home Layout: Two level;Able to live on main level with bedroom/bathroom     Bathroom Shower/Tub: Producer, television/film/videoWalk-in shower   Bathroom Toilet: Standard     Home Equipment: None   Additional Comments: drives      Prior Functioning/Environment Level of Independence: Independent        Comments: husband is retired and can provided 24/7    OT Diagnosis: Generalized weakness;Acute pain   OT Problem List: Decreased strength;Decreased activity tolerance;Impaired balance (sitting and/or standing);Decreased safety awareness;Decreased knowledge of use of DME or AE;Decreased knowledge of precautions;Cardiopulmonary status limiting activity;Pain   OT Treatment/Interventions: Self-care/ADL training;Therapeutic exercise;DME and/or AE instruction;Therapeutic activities;Patient/family education;Other (comment)    OT Goals(Current goals can be found in the care plan section) Acute Rehab OT Goals Patient Stated Goal: none stated at this time OT Goal Formulation: With patient Time For Goal Achievement: 05/18/16 Potential to Achieve Goals: Good  OT Frequency: Min 2X/week   Barriers to D/C:            Co-evaluation              End of Session Equipment Utilized During Treatment: Gait belt;Back brace Nurse Communication: Mobility status;Precautions  Activity Tolerance: Patient limited by pain;Other (comment)  (dizziness) Patient left: in chair;with call bell/phone within reach   Time: 0810-0832 OT Time Calculation (min): 22 min Charges:  OT General Charges $OT Visit: 1 Procedure OT Evaluation $OT Eval Moderate Complexity: 1 Procedure G-Codes:    Boone MasterJones, Trevian Hayashida B 05/04/2016, 8:59 AM  Mateo FlowJones, Brynn   OTR/L Pager: [phone] Office: [phone] .

## 2016-05-05 MED ORDER — GABAPENTIN 100 MG PO CAPS
200.0000 mg | ORAL_CAPSULE | Freq: Two times a day (BID) | ORAL | Status: DC
  Administered 2016-05-05 – 2016-05-06 (×3): 200 mg via ORAL
  Filled 2016-05-05 (×3): qty 2

## 2016-05-05 MED ORDER — ACETAMINOPHEN 500 MG PO TABS
1000.0000 mg | ORAL_TABLET | Freq: Four times a day (QID) | ORAL | Status: DC | PRN
  Administered 2016-05-05 – 2016-05-06 (×2): 1000 mg via ORAL
  Filled 2016-05-05 (×2): qty 2

## 2016-05-05 MED ORDER — CLOPIDOGREL BISULFATE 75 MG PO TABS
75.0000 mg | ORAL_TABLET | Freq: Every day | ORAL | Status: DC
  Administered 2016-05-06: 75 mg via ORAL
  Filled 2016-05-05: qty 1

## 2016-05-05 MED ORDER — KETOROLAC TROMETHAMINE 10 MG PO TABS
10.0000 mg | ORAL_TABLET | Freq: Four times a day (QID) | ORAL | Status: DC | PRN
  Administered 2016-05-05 – 2016-05-06 (×3): 10 mg via ORAL
  Filled 2016-05-05 (×5): qty 1

## 2016-05-05 MED ORDER — BUTALBITAL-APAP-CAFFEINE 50-325-40 MG PO TABS
1.0000 | ORAL_TABLET | Freq: Four times a day (QID) | ORAL | Status: DC | PRN
  Administered 2016-05-05 – 2016-05-06 (×2): 1 via ORAL
  Filled 2016-05-05 (×2): qty 1

## 2016-05-05 MED ORDER — TRAMADOL HCL 50 MG PO TABS
50.0000 mg | ORAL_TABLET | Freq: Four times a day (QID) | ORAL | Status: DC | PRN
  Administered 2016-05-05 – 2016-05-06 (×3): 50 mg via ORAL
  Filled 2016-05-05 (×3): qty 1

## 2016-05-05 MED FILL — Heparin Sodium (Porcine) Inj 1000 Unit/ML: INTRAMUSCULAR | Qty: 30 | Status: AC

## 2016-05-05 MED FILL — Sodium Chloride IV Soln 0.9%: INTRAVENOUS | Qty: 1000 | Status: AC

## 2016-05-05 NOTE — Progress Notes (Signed)
Occupational Therapy Treatment Patient Details Name: Abigail [name] MRN: [id number] DOB: 11/06/1945 Today's Date: 05/05/2016    History of present illness 70 yo s/p PLIF L4-5 L5- S1 PMH: HTN , CVA 04/2015, TIA, PFO, OSA, s/p ventral hernia repair 3/15, s/p sigmoid colo resection, RSO 12/12   OT comments  Pt making gradual progress toward OT goals this session; continues to be limited by pain and dizziness with positional changes. Pt required overall min assist for bed mobility, min guard for toilet transfer, and mod assist for managing back brace. Pt able to recall 1/3 back precautions; reviewed all precautions with pt. [facility] Will continue to follow acutely.   Follow Up Recommendations  Home health OT;Supervision/Assistance - 24 hour    Equipment Recommendations  3 in 1 bedside comode    Recommendations for Other Services      Precautions / Restrictions Precautions Precautions: Back Precaution Comments: Pt able to recall 1/3 back precautions. Reviewed all precautions with pt. Required Braces or Orthoses: Spinal Brace Spinal Brace: Lumbar corset;Applied in sitting position Restrictions Weight Bearing Restrictions: No       Mobility Bed Mobility Overal bed mobility: Needs Assistance Bed Mobility: Sidelying to Sit;Sit to Sidelying   Sidelying to sit: Min assist     Sit to sidelying: Min assist General bed mobility comments: Pt able to manage LEs off EOB but required assist for elevation of trunk to sitting and controlled descent back to bed. HOB flat with heavy use of bed rails.  Transfers Overall transfer level: Needs assistance Equipment used: Rolling walker (2 wheeled) Transfers: Sit to/from Stand Sit to Stand: Min assist         General transfer comment: Min assist for balance with sit to stand from EOB. VCs for hand placement and technique.    Balance Overall balance assessment: Needs assistance Sitting-balance support:  Feet supported;Single extremity supported Sitting balance-Leahy Scale: Poor Sitting balance - Comments: Requires one extremity support due to pain   Standing balance support: No upper extremity supported;During functional activity Standing balance-Leahy Scale: Fair Standing balance comment: Able to stand at sink and wash hands without UE support                   ADL Overall ADL's : Needs assistance/impaired     Grooming: Supervision/safety;Standing           Upper Body Dressing : Moderate assistance;Sitting Upper Body Dressing Details (indicate cue type and reason): to don/doff brace due to pain     Toilet Transfer: Min guard;Ambulation;BSC;RW   Toileting- Clothing Manipulation and Hygiene: Supervision/safety;Sit to/from stand Toileting - Clothing Manipulation Details (indicate cue type and reason): for peri care. No twisting observed     Functional mobility during ADLs: Min guard;Rolling walker General ADL Comments: Pt continues to c/o dizziness with positional changes. Resolved within ~3 mintues sitting EOB.        Vision                     Perception     Praxis      Cognition   Behavior During Therapy: [facility]FL for tasks assessed/performed Overall Cognitive Status: Within Functional Limits for tasks assessed                       Extremity/Trunk Assessment               Exercises     Shoulder Instructions  General Comments      Pertinent Vitals/ Pain       Pain Assessment: Faces Faces Pain Scale: Hurts even more Pain Location: back Pain Descriptors / Indicators: Aching;Grimacing;Guarding;Moaning Pain Intervention(s): Monitored during session;Repositioned  Home Living                                          Prior Functioning/Environment              Frequency Min 2X/week     Progress Toward Goals  OT Goals(current goals can now be found in the care plan section)  Progress towards OT  goals: Progressing toward goals  Acute Rehab OT Goals Patient Stated Goal: To feel better OT Goal Formulation: With patient  Plan Discharge plan needs to be updated    Co-evaluation                 End of Session Equipment Utilized During Treatment: Gait belt;Rolling walker;Back brace   Activity Tolerance Patient limited by pain   Patient Left in bed;with call bell/phone within reach;with family/visitor present   Nurse Communication Mobility status        Time: 0800-0820 OT Time Calculation (min): 20 min  Charges: OT General Charges $OT Visit: 1 Procedure OT Treatments $Self Care/Home Management : 8-22 mins  [name] M.S., OTR/L Pager: [phone]  05/05/2016, 8:50 AM

## 2016-05-05 NOTE — Progress Notes (Addendum)
Subjective: Patient reports "My back and my left leg hurt when I move myself in bed or try to get up out of bed"  Objective: Vital signs in last 24 hours: Temp:  [98.5 F (36.9 C)-100.3 F (37.9 C)] 98.5 F (36.9 C) (08/17 1141) Pulse Rate:  [64-88] 88 (08/17 1141) Resp:  [16-20] 18 (08/17 1141) BP: (103-123)/(44-73) 123/73 (08/17 1141) SpO2:  [93 %-99 %] 99 % (08/17 1141)  Intake/Output from previous day: 08/16 0701 - 08/17 0700 In: -  Out: 575 [Urine:575] Intake/Output this shift: Total I/O In: -  Out: 200 [Urine:200]  Sitting up in chair, no sign of discomfort. Daughter present. Pt reports "excruciating" left thigh pain and low back pain with movement. Pain resolves soon after position change. No pain with ambulation, but she does voice concern about feeling her left leg is weak when walking.  Incision without erythema, swelling, or drainage beneath honeycomb & Dermabond. Strength is good BLE. Right lumbar discomfort only with Rt hip flexion. No pain with left leg strength testing.   Pt reports little relief with meds attempted so far, with significant nausea with most.  Home meds of Fioricet 2 tabs q 4-6hrs & Tramadol 50mg  2 tabs q 4-6 hrs is discussed with pt & daughter - too much, too frequently.   Lab Results: No results for input(s): WBC, HGB, HCT, PLT in the last 72 hours. BMET No results for input(s): NA, K, CL, CO2, GLUCOSE, BUN, CREATININE, CALCIUM in the last 72 hours.  Studies/Results: Dg Lumbar Spine 2-3 Views  Result Date: 05/03/2016 CLINICAL DATA:  Surgery at L4-5 and L5-S1 FLUOROSCOPY TIME:  74 seconds. Images: 2 EXAM: LUMBAR SPINE - 2-3 VIEW; DG C-ARM 61-120 MIN COMPARISON:  None. FINDINGS: Pedicle screws have been placed at L4, L5, and S1 by the end of the study with disc spacer devices at L4-5 and L5-S1. The hardware appears to be in good position. The L4 screws approaches the superior endplate but do not appear to extend through the end plate. IMPRESSION:  Surgery at L4-5 and L5-S1 as above. Electronically Signed   By: Gerome Samavid  Williams III M.D   On: 05/03/2016 15:49   Dg C-arm 61-120 Min  Result Date: 05/03/2016 CLINICAL DATA:  Surgery at L4-5 and L5-S1 FLUOROSCOPY TIME:  74 seconds. Images: 2 EXAM: LUMBAR SPINE - 2-3 VIEW; DG C-ARM 61-120 MIN COMPARISON:  None. FINDINGS: Pedicle screws have been placed at L4, L5, and S1 by the end of the study with disc spacer devices at L4-5 and L5-S1. The hardware appears to be in good position. The L4 screws approaches the superior endplate but do not appear to extend through the end plate. IMPRESSION: Surgery at L4-5 and L5-S1 as above. Electronically Signed   By: Gerome Samavid  Williams III M.D   On: 05/03/2016 15:49    Assessment/Plan: Incision healing nicely; mobilizing slowly d/t pain.  LOS: 2 days  Per Dr. Venetia MaxonStern, reduce Tramadol to 50mg  QID prn, Reduce Fioricet to q6hrs, add Neurontin 200mg  BID, and add Toradol 10mg  QID.  Coontiue to mobilize with hopes of d/c to home tomorrow.    Georgiann Cockeroteat, Brian 05/05/2016, 12:59 PM   Patient progressing slowly.

## 2016-05-05 NOTE — Progress Notes (Signed)
Physical Therapy Evaluation Patient Details Name: Abigail DerryLynn G [name] MRN: [id number] DOB: 04/23/1946 Today's Date: 05/05/2016   History of Present Illness  70 yo s/p PLIF L4-5 L5- S1 PMH: HTN , CVA 04/2015, TIA, PFO, OSA, s/p ventral hernia repair 3/15, s/p sigmoid colo resection, RSO 12/12  Clinical Impression  Patient progressing with ambulation distance despite still painful.  Patient slow with movements and with L hip instability due to pain/weakness.  Will continue to benefit from skilled PT to progress to d/c home with family support and HHPT.    Follow Up Recommendations Home health PT    Equipment Recommendations  Rolling walker with 5" wheels;3in1 (PT)    Recommendations for Other Services       Precautions / Restrictions Precautions Precautions: Back Precaution Comments: Pt able to recall 2/3 back precautions. Reviewed all precautions with pt. Spinal Brace: Lumbar corset;Applied in sitting position      Mobility  Bed Mobility Overal bed mobility: Needs Assistance   Rolling: Supervision Sidelying to sit: Min assist       General bed mobility comments: use of rail and assist to lift trunk due to pain  Transfers Overall transfer level: Needs assistance Equipment used: Rolling walker (2 wheeled) Transfers: Sit to/from Stand Sit to Stand: Supervision;Min guard         General transfer comment: stood from bed with assist for safety, from 3:1 over toilet with supervision  Ambulation/Gait Ambulation/Gait assistance: Min guard;Min assist Ambulation Distance (Feet): 130 Feet Assistive device: Rolling walker (2 wheeled) Gait Pattern/deviations: Antalgic;Trendelenburg;Step-through pattern;Decreased stride length     General Gait Details: giving into pain on R hip with ambulation, did side step to get by bed to chair  Stairs            Wheelchair Mobility    Modified Rankin (Stroke Patients Only)       Balance Overall balance assessment: Needs assistance            Standing balance-Leahy Scale: Fair Standing balance comment: washed hands at sink no UE support                             Pertinent Vitals/Pain Faces Pain Scale: Hurts whole lot Pain Location: back and L leg Pain Descriptors / Indicators: Aching;Operative site guarding;Grimacing;Moaning Pain Intervention(s): Monitored during session;Repositioned    Home Living                        Prior Function                 Hand Dominance        Extremity/Trunk Assessment                         Communication      Cognition Arousal/Alertness: Awake/alert Behavior During Therapy: WFL for tasks assessed/performed Overall Cognitive Status: Within Functional Limits for tasks assessed                      General Comments General comments (skin integrity, edema, etc.): reports only mild symptoms that quickly resolve with R side to sit, agrees that pain more than dizziness so did not want to attempt to treat    Exercises        Assessment/Plan    PT Assessment    PT Diagnosis     PT Problem List    PT Treatment  Interventions     PT Goals (Current goals can be found in the Care Plan section)      Frequency Min 5X/week   Barriers to discharge        Co-evaluation               End of Session Equipment Utilized During Treatment: Back brace Activity Tolerance: Patient limited by pain Patient left: in chair;with call bell/phone within reach;with family/visitor present           Time: 1308-65781050-1118 PT Time Calculation (min) (ACUTE ONLY): 28 min   Charges:     PT Treatments $Gait Training: 8-22 mins $Therapeutic Activity: 8-22 mins   PT G Codes:        Elray McgregorCynthia Dorin Stooksbury 05/05/2016, 1:47 PM  Sheran Lawlessyndi Wilmina Maxham, PT [phone] 05/05/2016

## 2016-05-06 ENCOUNTER — Encounter (HOSPITAL_COMMUNITY): Payer: Self-pay | Admitting: Neurosurgery

## 2016-05-06 NOTE — Progress Notes (Signed)
Occupational Therapy Treatment Patient Details Name: Abigail [name] MRN: [id number] DOB: 09/19/1945 Today's Date: 05/06/2016    History of present illness 70 yo s/p PLIF L4-5 L5- S1 PMH: HTN , CVA 04/2015, TIA, PFO, OSA, s/p ventral hernia repair 3/15, s/p sigmoid colo resection, RSO 12/12   OT comments  Pt demonstrates BPPV R posterior canal during session for bed mobility. Pt provided two person (A) for vestibular exercises due to back surg. Pt could benefit from community based rehab due to fall risk during all adls. Pt required two person (A) for safety to transfer to chair.    Follow Up Recommendations  SNF    Equipment Recommendations  3 in 1 bedside comode    Recommendations for Other Services      Precautions / Restrictions Precautions Precautions: Back Required Braces or Orthoses: Spinal Brace Spinal Brace: Lumbar corset;Applied in sitting position       Mobility Bed Mobility Overal bed mobility: Needs Assistance Bed Mobility: Rolling;Sidelying to Sit;Sit to Sidelying Rolling: Min assist Sidelying to sit: Min assist Supine to sit: Mod assist   Sit to sidelying: Min assist General bed mobility comments: Used two person mod assist to preform modified [employer] Hallpike Bil and [employer] Daroff exercises, however, she generally only needs one person heavy min assist to mobilize EOB and back into bed.   Transfers Overall transfer level: Needs assistance Equipment used: Rolling walker (2 wheeled) Transfers: Sit to/from Stand Sit to Stand: +2 physical assistance;Min assist Stand pivot transfers: +2 physical assistance;Min assist       General transfer comment: requires (A) to power up from bed surface and knee flexion present. Pt reports with ambulation L LE flexion due to pain. cues for safety    Balance Overall balance assessment: Needs assistance Sitting-balance support: Feet supported;Bilateral upper extremity supported Sitting balance-Leahy Scale: Poor Sitting balance  - Comments: Min assist up to mod assist EOB depending on if she is dizzy.  Pt never releases bil upper extremities supported on bed.  Postural control: Other (comment) ((+) sway) Standing balance support: Bilateral upper extremity supported;During functional activity Standing balance-Leahy Scale: Poor Standing balance comment: bil UE support required                   ADL Overall ADL's : Needs assistance/impaired                                       General ADL Comments: Session focused on bed mobility, basic transfer don doff brace and vestibular asessment. pt was positive for R posterior canal BPPV during session with nystagmus present and very brief. pt was able to progress during session after vestibular exercises provided ( see PT notes for further detail). Pt total (A) to don brace, mod (A) to transfer to chair with unsteady on feet. pt reports vertigo affecting overall adls and bed mobility.       Vision                     Perception     Praxis      Cognition   Behavior During Therapy: [facility] for tasks assessed/performed Overall Cognitive Status: Within Functional Limits for tasks assessed                       Extremity/Trunk Assessment  Exercises     Shoulder Instructions       General Comments      Pertinent Vitals/ Pain       Pain Assessment: 0-10 Pain Score: 7  Pain Location: back incision site and L LE Pain Descriptors / Indicators: Operative site guarding Pain Intervention(s): Limited activity within patient's tolerance;Premedicated before session;Repositioned;Monitored during session  Home Living                                          Prior Functioning/Environment              Frequency Min 2X/week     Progress Toward Goals  OT Goals(current goals can now be found in the care plan section)  Progress towards OT goals: Progressing toward goals  Acute Rehab OT  Goals Patient Stated Goal: To feel better OT Goal Formulation: With patient Time For Goal Achievement: 05/18/16 Potential to Achieve Goals: Good ADL Goals Pt Will Perform Grooming: with supervision;sitting Pt Will Perform Upper Body Bathing: with supervision;sitting Pt Will Transfer to Toilet: with supervision;bedside commode;ambulating Additional ADL Goal #1: Pt will complete bed mobility supervision level for adls. Additional ADL Goal #2: Pt will don doff brace mod I as precursor to adls  Plan Discharge plan needs to be updated    Co-evaluation    PT/OT/SLP Co-Evaluation/Treatment: Yes Reason for Co-Treatment: Complexity of the patient's impairments (multi-system involvement);For patient/therapist safety PT goals addressed during session: Mobility/safety with mobility;Balance;Proper use of DME OT goals addressed during session: ADL's and self-care;Strengthening/ROM      End of Session Equipment Utilized During Treatment: Gait belt;Back brace   Activity Tolerance Patient limited by pain   Patient Left with call bell/phone within reach;with family/visitor present;in chair   Nurse Communication Mobility status;Precautions        Time: [id number] OT Time Calculation (min): 38 min  Charges: OT General Charges $OT Visit: 1 Procedure OT Treatments $Self Care/Home Management : 8-22 mins  Boone MasterJones, Abigail Wass B 05/06/2016, 2:45 PM   Mateo FlowJones, Abigail   OTR/L Pager: [phone] Office: [phone] .

## 2016-05-06 NOTE — [place] FL2 (Signed)
[place] MEDICAID FL2 LEVEL OF CARE SCREENING TOOL     IDENTIFICATION  Patient Name: Abigail [name] Birthdate: 04/23/1946 Sex: female Admission Date (Current Location): 05/03/2016  [facility] [place]  [employer]   Facility and Address:  [facility], [address], [place], [place] [address]      Provider Number: [id number]  Attending Physician Name and Address:  Maeola HarmanJoseph Stern, MD  Relative Name and Phone Number:       Current Level of Care: Hospital Recommended Level of Care: Skilled Nursing Facility Prior Approval Number:    Date Approved/Denied:   PASRR Number: [id number]  Discharge Plan: SNF    Current Diagnoses: Patient Active Problem List   Diagnosis Date Noted  . Spondylolisthesis of lumbar region 05/03/2016  . Lacunar infarct, acute (HCC) 11/17/2015  . Dizziness and giddiness 06/04/2015  . Right-sided lacunar infarction (HCC) 06/04/2015  . Lacunar ataxic hemiparesis (HCC) 06/03/2015  . Unstable angina (HCC)   . Chest pain 05/07/2015  . Tick bite of right forearm 09/04/2014  . Ventral incisional hernia 12/13/2013  . Benign microscopic hematuria 03/05/2012  . Diverticulitis of sigmoid colon 05/04/2011  . PRECORDIAL PAIN 06/08/2010  . PALPITATIONS 05/27/2010  . CERVICAL LYMPHADENOPATHY, RIGHT 05/15/2008  . Headache 02/28/2008  . TENSION HEADACHE 02/12/2008  . Sebaceous cyst 12/11/2007  . PROBLEMS WITH SWALLOWING AND MASTICATION 11/20/2007  . COLONIC POLYPS, HX OF 07/23/2007  . Hypothyroidism 03/06/2007  . HYPERLIPIDEMIA 03/06/2007  . Essential hypertension 03/06/2007  . LOW BACK PAIN 03/06/2007  . History of cardiovascular disorder 03/06/2007    Orientation RESPIRATION BLADDER Height & Weight     Self, Time, Situation, Place  Normal Continent Weight: 181 lb (82.1 kg) Height:     BEHAVIORAL SYMPTOMS/MOOD NEUROLOGICAL BOWEL NUTRITION STATUS      Continent    AMBULATORY STATUS COMMUNICATION OF NEEDS Skin   Limited  Assist Verbally Surgical wounds                       Personal Care Assistance Level of Assistance  Dressing, Bathing Bathing Assistance: Limited assistance   Dressing Assistance: Limited assistance     Functional Limitations Info             SPECIAL CARE FACTORS FREQUENCY  PT (By licensed PT), OT (By licensed OT)     PT Frequency: daily OT Frequency: daily            Contractures Contractures Info: Not present    Additional Factors Info  Allergies   Allergies Info: Dilaudid Hydromorphone Hcl, Shellfish Allergy, Aggrenox Aspirin-dipyridamole Er, Cephalexin, Codeine Phosphate, Contrast Media Iodinated Diagnostic Agents, Hydrocodone, Meperidine Hcl, Percocet Oxycodone-acetaminophen           Current Medications (05/06/2016):  This is the current hospital active medication list Current Facility-Administered Medications  Medication Dose Route Frequency Provider Last Rate Last Dose  . acetaminophen (TYLENOL) tablet 1,000 mg  1,000 mg Oral Q6H PRN Maeola HarmanJoseph Stern, MD   1,000 mg at 05/06/16 0406  . alum & mag hydroxide-simeth (MAALOX/MYLANTA) 200-200-20 MG/5ML suspension 30 mL  30 mL Oral Q6H PRN Maeola HarmanJoseph Stern, MD      . atorvastatin (LIPITOR) tablet 40 mg  40 mg Oral q1800 Maeola HarmanJoseph Stern, MD   40 mg at 05/05/16 1934  . bisacodyl (DULCOLAX) suppository 10 mg  10 mg Rectal Daily PRN Maeola HarmanJoseph Stern, MD      . butalbital-acetaminophen-caffeine (FIORICET, ESGIC) 50-325-40 MG per tablet 1 tablet  1 tablet  Oral Q6H PRN Maeola HarmanJoseph Stern, MD   1 tablet at 05/06/16 1049  . clopidogrel (PLAVIX) tablet 75 mg  75 mg Oral Daily Maeola HarmanJoseph Stern, MD   75 mg at 05/06/16 1045  . dextrose 5 % and 0.45 % NaCl with KCl 20 mEq/L infusion   Intravenous Continuous Maeola HarmanJoseph Stern, MD      . docusate sodium (COLACE) capsule 100 mg  100 mg Oral BID Maeola HarmanJoseph Stern, MD   100 mg at 05/06/16 1047  . famotidine (PEPCID) tablet 20 mg  20 mg Oral BID Maeola HarmanJoseph Stern, MD   20 mg at 05/06/16 1048  . gabapentin (NEURONTIN)  capsule 200 mg  200 mg Oral BID Maeola HarmanJoseph Stern, MD   200 mg at 05/06/16 1047  . lisinopril (PRINIVIL,ZESTRIL) tablet 20 mg  20 mg Oral Daily Maeola HarmanJoseph Stern, MD   20 mg at 05/06/16 1048   And  . hydrochlorothiazide (HYDRODIURIL) tablet 25 mg  25 mg Oral Daily Maeola HarmanJoseph Stern, MD   25 mg at 05/06/16 1047  . ketorolac (TORADOL) tablet 10 mg  10 mg Oral Q6H PRN Maeola HarmanJoseph Stern, MD   10 mg at 05/06/16 0350  . levothyroxine (SYNTHROID, LEVOTHROID) tablet 125 mcg  125 mcg Oral QAC breakfast Maeola HarmanJoseph Stern, MD   125 mcg at 05/06/16 0546  . menthol-cetylpyridinium (CEPACOL) lozenge 3 mg  1 lozenge Oral PRN Maeola HarmanJoseph Stern, MD       Or  . phenol (CHLORASEPTIC) mouth spray 1 spray  1 spray Mouth/Throat PRN Maeola HarmanJoseph Stern, MD      . methocarbamol (ROBAXIN) tablet 500 mg  500 mg Oral Q6H PRN Maeola HarmanJoseph Stern, MD   500 mg at 05/06/16 0350   Or  . methocarbamol (ROBAXIN) 500 mg in dextrose 5 % 50 mL IVPB  500 mg Intravenous Q6H PRN Maeola HarmanJoseph Stern, MD      . morphine 2 MG/ML injection 2-4 mg  2-4 mg Intravenous Q3H PRN Maeola HarmanJoseph Stern, MD   2 mg at 05/04/16 1347  . ondansetron (ZOFRAN) injection 4 mg  4 mg Intravenous Q4H PRN Maeola HarmanJoseph Stern, MD   4 mg at 05/04/16 0855  . polyvinyl alcohol (LIQUIFILM TEARS) 1.4 % ophthalmic solution 1-2 drop  1-2 drop Both Eyes PRN Maeola HarmanJoseph Stern, MD      . promethazine (PHENERGAN) tablet 12.5-25 mg  12.5-25 mg Oral Q6H PRN Maeola HarmanJoseph Stern, MD   25 mg at 05/05/16 1935  . senna-docusate (Senokot-S) tablet 1 tablet  1 tablet Oral QHS PRN Maeola HarmanJoseph Stern, MD      . sodium phosphate (FLEET) 7-19 GM/118ML enema 1 enema  1 enema Rectal Once PRN Maeola HarmanJoseph Stern, MD      . traMADol Janean Sark(ULTRAM) tablet 50 mg  50 mg Oral Q6H PRN Maeola HarmanJoseph Stern, MD   50 mg at 05/06/16 1048  . verapamil (CALAN-SR) CR tablet 240 mg  240 mg Oral QHS Maeola HarmanJoseph Stern, MD   240 mg at 05/05/16 2142  . zolpidem (AMBIEN) tablet 5 mg  5 mg Oral QHS PRN Maeola HarmanJoseph Stern, MD         Discharge Medications: Please see discharge summary for a list of discharge  medications.  Relevant Imaging Results:  Relevant Lab Results:   Additional Information SSN: [id number]  Rondel Batonngle, Gloristine Turrubiates C, LCSW

## 2016-05-06 NOTE — Progress Notes (Addendum)
Subjective: Patient reports "I'm ok; I just still hurt when I turn over or try to get up"  Objective: Vital signs in last 24 hours: Temp:  [98.5 F (36.9 C)-102.4 F (39.1 C)] 98.8 F (37.1 C) (08/18 0547) Pulse Rate:  [69-88] 77 (08/18 0358) Resp:  [18-20] 20 (08/18 0358) BP: (98-123)/(51-73) 116/57 (08/18 0358) SpO2:  [92 %-99 %] 94 % (08/18 0358)  Intake/Output from previous day: 08/17 0701 - 08/18 0700 In: 480 [P.O.:480] Out: 200 [Urine:200] Intake/Output this shift: No intake/output data recorded.  Awakens to voice. Conversant, reporting improved pain control. She does note persistent lumbar pain with position changes; but pain dissipates quickly when walking. Strength is good BLE. Incision is flat without erythema or drainage. Temp spiked overnight, but has returned to normal. She is asymptomatic. She does not want to go to a SNF for rehab and feels confident her husband can help her at home.  (His next bowling tournament is Wednesday and he will be away from home 4 hours.)  Lab Results: No results for input(s): WBC, HGB, HCT, PLT in the last 72 hours. BMET No results for input(s): NA, K, CL, CO2, GLUCOSE, BUN, CREATININE, CALCIUM in the last 72 hours.  Studies/Results: No results found.  Assessment/Plan: Improving  LOS: 3 days  Ok per DrStern to d/c to home. HHPT/OT. 3-in-one BSC for home use. Pt verbalizes understanding of d/c instructions and agrees to call office to schedule 3-4 week office f/u. Rx's to chart for Toradol mg QID x 2 more days only (#8), Neurontin 200mg  BID #60.  Pt has Fioricet & Tramadol at home, and should reduce those doses to 1 po q 4-6 hrs prn as changed yesterday. Georgiann Cockeroteat, Brian 05/06/2016, 7:29 AM    Patient doing well.  Discharge home.

## 2016-05-06 NOTE — Progress Notes (Signed)
Patient alert and oriented, mae's well, voiding adequate amount of urine, swallowing without difficulty, c/o moderate pain and meds given prior to discharged for ride and discomfort. Patient discharged to facility with [facility]. Script and [facility]. Patient and family stated understanding of instructions given.

## 2016-05-06 NOTE — Discharge Summary (Signed)
Physician Discharge Summary  Patient ID: Abigail [name] MRN: [id number] DOB/AGE: 71/02/1946 71 y.o.  Admit date: 05/03/2016 Discharge date: 05/06/2016  Admission Diagnoses: Spondylolisthesis, Lumbar region, herniated lumbar disc, stenosis, radiculopathy L 4 5 and L 5 S 1 levels   Discharge Diagnoses: Spondylolisthesis, Lumbar region, herniated lumbar disc, stenosis, radiculopathy L 4 5 and L 5 S 1 levels s/p L4-5 L5-S1 Maximum access posterior lumbar interbody fusion (N/A) - L4-5 L5-S1 Maximum access posterior lumbar interbody fusion with PEEK cages, autograft, allograft, pedicle screw fixation, posterolateral arthrodesis    Active Problems:   Spondylolisthesis of lumbar region   Discharged Condition: good  Hospital Course: Otilio ConnorsLynn Amadi was admitted for surgery with dx spondylolisthesis, stenosis, and radiculopathy.  Following uncomplicated PLIF L4-S1, she recovered nicely and transferred to [facility] for nursing care and therapies.  Her progression has been slowed d/t lumbar pain.  This has responded well to medication adjustments. She is mobilizing well.   Consults: None  Significant Diagnostic Studies: radiology: X-Ray: intra-op  Treatments: surgery: L4-5 L5-S1 Maximum access posterior lumbar interbody fusion (N/A) - L4-5 L5-S1 Maximum access posterior lumbar interbody fusion with PEEK cages, autograft, allograft, pedicle screw fixation, posterolateral arthrodesis    Discharge Exam: Blood pressure 110/61, pulse (!) 59, temperature 98.7 F (37.1 C), resp. rate 18, weight 82.1 kg (181 lb), SpO2 96 %. Awakens to voice. Conversant, reporting improved pain control. She does note persistent lumbar pain with position changes; but pain dissipates quickly when walking. Strength is good BLE. Incision is flat without erythema or drainage. Temp spiked overnight, but has returned to normal. She is asymptomatic. She does not want to go to a SNF for rehab and feels confident her husband can help her at home.   (His next bowling tournament is Wednesday and he will be away from home 4 hours.)     Disposition: 01-Home or Self Care Ok per DrStern to d/c to home. HHPT/OT. 3-in-one BSC for home use. Pt verbalizes understanding of d/c instructions and agrees to call office to schedule 3-4 week office f/u. Rx's to chart for Toradol mg QID x 2 more days only (#8), Neurontin 200mg  BID #60.  Pt has Fioricet & Tramadol at home, and should reduce those doses to 1 po q 4-6 hrs prn as changed yesterday. (Pt may spread the Toradol dosing over next several days, but should stop after the 8 doses complete)      Medication List    STOP taking these medications   diclofenac 75 MG EC tablet Commonly known as:  VOLTAREN     TAKE these medications   atorvastatin 40 MG tablet Commonly known as:  LIPITOR TAKE 1 TABLET BY MOUTH EVERY DAY   BIOTIN PO Take 1 tablet by mouth daily.   butalbital-acetaminophen-caffeine 50-325-40 MG tablet Commonly known as:  FIORICET, ESGIC TAKE 1 TO 2 TABLETS BY MOUTH EVERY 6 HOURS AS NEEDED FOR HEADACHE   clopidogrel 75 MG tablet Commonly known as:  PLAVIX Take 1 tablet (75 mg total) by mouth every evening.   levothyroxine 125 MCG tablet Commonly known as:  SYNTHROID, LEVOTHROID TAKE 1 TABLET BY MOUTH DAILY BEFORE BREAKFAST. What changed:  See the new instructions.   lisinopril-hydrochlorothiazide 20-12.5 MG tablet Commonly known as:  ZESTORETIC Take 1 tablet by mouth daily.   tetrahydrozoline 0.05 % ophthalmic solution Place 2 drops into both eyes as needed (for red eyes). Reported on 02/12/2016   traMADol 50 MG tablet Commonly known as:  ULTRAM Take 1 tablet (50 mg total)  by mouth every 6 (six) hours as needed. for pain   verapamil 240 MG 24 hr capsule Commonly known as:  VERELAN PM Take 1 capsule (240 mg total) by mouth at bedtime.        Signed: Georgiann Cockeroteat, Rhett Mutschler 05/06/2016, 8:08 AM

## 2016-05-06 NOTE — Clinical Social Work Note (Signed)
Clinical Social Work Assessment  Patient Details  Name: Abigail [name] MRN: [id number] Date of Birth: [birth year]  Date of referral:  05/06/16               Reason for consult:  Facility Placement                Permission sought to share information with:  Chartered certified accountant granted to share information::  Yes, Verbal Permission Granted  Name::        Agency::   [facility] SNFs)  Relationship::     Contact Information:     Housing/Transportation Living arrangements for the past 2 months:  [place] of Information:  Patient, Adult Children Patient Interpreter Needed:  None Criminal Activity/Legal Involvement Pertinent to Current Situation/Hospitalization:  No - Comment as needed Significant Relationships:  Adult Children Lives with:  Self Do you feel safe going back to the place where you live?  No Need for family participation in patient care:  No (Coment)  Care giving concerns:  No caregiving concerns addressed at time of the assessment.   Social Worker assessment / plan:  CSW met with patient, patient's daughter and granddaughter at bedside to discuss discharge plans.  Patient is agreeable to SNF and STR. Daughter and patient wishes for patient to be discharged to a SNF in [facility].  Employment status:  Retired Nurse, adult PT Recommendations:  [place] / Referral to community resources:  [place]  Patient/Family's Response to care:  Agreeable to SNF  Patient/Family's Understanding of and Emotional Response to Diagnosis, Current Treatment, and Prognosis:  Patient and family are anxious regarding possibility of SNF placement though she believes it is needed. Family and patient are hoping for 2 week or less which PT states is totally reasonable.  CSW provided support and education surrounding their STR stay.  Patient and family were  appreciative.  Emotional Assessment Appearance:  Appears stated age Attitude/Demeanor/Rapport:    Affect (typically observed):  Accepting, Adaptable Orientation:  Oriented to Self, Oriented to Place, Oriented to  Time, Oriented to Situation Alcohol / Substance use:  Not Applicable Psych involvement (Current and /or in the community):  No (Comment)  Discharge Needs  Concerns to be addressed:  No discharge needs identified Readmission within the last 30 days:  No Current discharge risk:  None Barriers to Discharge:  No Barriers Identified   Dulcy Fanny, LCSW 05/06/2016, 1:21 PM

## 2016-05-06 NOTE — Clinical Social Work Placement (Signed)
   CLINICAL SOCIAL WORK PLACEMENT  NOTE  Date:  05/06/2016  Patient Details  Name: Abigail [name] MRN: [id number] Date of Birth: 12/24/1945  Clinical Social Work is seeking post-discharge placement for this patient at the Skilled  Nursing Facility level of care (*CSW will initial, date and re-position this form in  chart as items are completed):  Yes   Patient/family provided with [place] Clinical Social Work Department's list of facilities offering this level of care within the geographic area requested by the patient (or if unable, by the patient's family).  Yes   Patient/family informed of their freedom to choose among providers that offer the needed level of care, that participate in Medicare, Medicaid or managed care program needed by the patient, have an available bed and are willing to accept the patient.  Yes   Patient/family informed of 's ownership interest in [facility] and [facility], as well as of the fact that they are under no obligation to receive care at these facilities.  PASRR submitted to EDS on 05/06/16     PASRR number received on 05/06/16     Existing PASRR number confirmed on       FL2 transmitted to all facilities in geographic area requested by pt/family on 05/06/16     FL2 transmitted to all facilities within larger geographic area on       Patient informed that his/her managed care company has contracts with or will negotiate with certain facilities, including the following:            Patient/family informed of bed offers received.  Patient chooses bed at       Physician recommends and patient chooses bed at      Patient to be transferred to   on  .  Patient to be transferred to facility by       Patient family notified on   of transfer.  Name of family member notified:        PHYSICIAN Please sign FL2, Please prepare prescriptions     Additional Comment:    _______________________________________________ Rondel BatonIngle,  Odin Mariani C, LCSW 05/06/2016, 11:10 AM

## 2016-05-06 NOTE — Clinical Social Work Placement (Signed)
   CLINICAL SOCIAL WORK PLACEMENT  NOTE  Date:  05/06/2016  Patient Details  Name: Abigail [name] MRN: [id number] Date of Birth: 04/22/1946  Clinical Social Work is seeking post-discharge placement for this patient at the Skilled  Nursing Facility level of care (*CSW will initial, date and re-position this form in  chart as items are completed):  Yes   Patient/family provided with [place] Clinical Social Work Department's list of facilities offering this level of care within the geographic area requested by the patient (or if unable, by the patient's family).  Yes   Patient/family informed of their freedom to choose among providers that offer the needed level of care, that participate in Medicare, Medicaid or managed care program needed by the patient, have an available bed and are willing to accept the patient.  Yes   Patient/family informed of [facility]'s ownership interest in [facility] and [facility], as well as of the fact that they are under no obligation to receive care at these facilities.  PASRR submitted to EDS on 05/06/16     PASRR number received on 05/06/16     Existing PASRR number confirmed on       FL2 transmitted to all facilities in geographic area requested by pt/family on 05/06/16     FL2 transmitted to all facilities within larger geographic area on       Patient informed that his/her managed care company has contracts with or will negotiate with certain facilities, including the following:        Yes   Patient/family informed of bed offers received.  [facility]     Physician recommends and patient chooses bed at      [facility].  Patient to be transferred to facility by [facility]     Patient family notified on 05/06/16 of transfer.  Name of family member notified:  patient is alert and oriented with daughter and granddaughter at bedside.  All parties aware and  agreeable     PHYSICIAN Please sign FL2, Please prepare prescriptions     Additional Comment:    _______________________________________________ Rondel BatonIngle, Tarynn Garling C, LCSW 05/06/2016, 1:21 PM

## 2016-05-06 NOTE — Progress Notes (Addendum)
Physical Therapy Treatment Patient Details Name: Abigail [name] MRN: [id number] DOB: 09/03/1946 Today's Date: 05/06/2016    History of Present Illness 70 yo s/p PLIF L4-5 L5- S1 PMH: HTN , CVA 04/2015, TIA, PFO, OSA, s/p ventral hernia repair 3/15, s/p sigmoid colo resection, RSO 12/12    PT Comments    Pt is not progressing as originally anticipated due to increased pain, weakness in her left leg, and secondary complication of positional vertigo.  Testing today indicated a R posterior canal BPPV treated with Austin MilesBrandt Daroff exercises as these are the least straining on her back vs Epley's Maneuver.  Pt is scared to go home, and I agree at this point that she would be safer with SNF level rehab at discharge where she can get stronger and get focused treatment both for her vertigo and post surgical status.     Follow Up Recommendations  SNF (please request SNF with vestibular therapist)      Equipment Recommendations  Rolling walker with 5" wheels;3in1 (PT)    Recommendations for Other Services       Precautions / Restrictions Precautions Precautions: Back Required Braces or Orthoses: Spinal Brace Spinal Brace: Lumbar corset;Applied in sitting position    Mobility  Bed Mobility Overal bed mobility: Needs Assistance Bed Mobility: Rolling;Sidelying to Sit;Sit to Sidelying Rolling: Min assist Sidelying to sit: Min assist     Sit to sidelying: Min assist General bed mobility comments: Used two person mod assist to preform modified Dix Hallpike Bil and Goodyear TireBrandt Daroff exercises, however, she generally only needs one person heavy min assist to mobilize EOB and back into bed.   Transfers Overall transfer level: Needs assistance Equipment used: Rolling walker (2 wheeled) Transfers: Sit to/from [employer]GI CorporationStand;Stand Pivot Transfers Sit to Stand: +2 physical assistance;Min assist Stand pivot transfers: +2 physical assistance;Min assist       General transfer comment: Two person heavy min assist to  get to her feet over very flexed knees.  Verbal cues to extend knees and hips.  Assist needed to stabilize trunk and power up to standing and to help support trunk over flexed legs until pt could use upper extremity support to get all the way to fully upright standing.  Slow, shuffling steps with heavy reliance on upper extremity support to get to chair. Verbal cues for safe descent  Ambulation/Gait             General Gait Details: Pt walked earlier with RN staff, however, pt reported "it was not very far because I felt like my left leg was going to buckle"          Balance Overall balance assessment: Needs assistance Sitting-balance support: Feet supported;Bilateral upper extremity supported Sitting balance-Leahy Scale: Poor Sitting balance - Comments: Min assist up to mod assist EOB depending on if she is dizzy.  Pt never releases bil upper extremities supported on bed.  Postural control: Other (comment) ((+) sway) Standing balance support: Bilateral upper extremity supported Standing balance-Leahy Scale: Poor Standing balance comment: bil hands maximally supported on RW during pivotal steps to the chair.          05/06/16 1236  Vestibular Assessment  General Observation eye alignment WNL, h/o episodic vertigo attacks, normally she rests for a few days and it will go away.  Has had recent cataract surgery, does not wear glasses, no ringing or fullness in her ears, (+) R sided hearing loss which was identified earlier this year, no recent head trauma, URI, or antibiotic use.  Symptom Behavior  Type of Dizziness Spinning  Frequency of Dizziness with getting into and out of bed, occasionally when walking.   Duration of Dizziness <1 min  Aggravating Factors Lying supine;Supine to sit;Rolling to left  Relieving Factors Closing eyes;Lying supine  Occulomotor Exam  Occulomotor Alignment Normal  Positional Testing  Dix-Hallpike Dix-Hallpike Right;Dix-Hallpike Left (modified  sidelying positioning)  Sidelying Test Sidelying Right;Sidelying Left  Horizontal Canal Testing Horizontal Canal Right;Horizontal Canal Left;Horizontal Canal Right Intensity;Horizontal Canal Left Intensity  Dix-Hallpike Right  Dix-Hallpike Right Duration 15  Dix-Hallpike Right Symptoms Other (comment);Upbeat, right rotatory nystagmus (only 2-3 beats and pt forcefully closes her eyes)  Dix-Hallpike Left  Dix-Hallpike Left Duration 0  Dix-Hallpike Left Symptoms No nystagmus  Sidelying Right  Sidelying Right Duration 10  Sidelying Right Symptoms Other (comment) (ageotropic)  Sidelying Left  Sidelying Left Duration 10  Sidelying Left Symptoms No nystagmus;Other (comment) (less symptomatic than right side)  Horizontal Canal Right  Horizontal Canal Right Duration 0  Horizontal Canal Right Symptoms Normal  Horizontal Canal Left  Horizontal Canal Left Duration 0  Horizontal Canal Left Symptoms Normal  Cognition  Cognition Orientation Level Oriented x 4     Pt treated for most symptomatic (+) test which was R modified Weyerhaeuser CompanyDix Hallpike with Francee PiccoloBrandt Daroff exercises x 2.            Cognition Arousal/Alertness: Awake/alert Behavior During Therapy: WFL for tasks assessed/performed Overall Cognitive Status: Within Functional Limits for tasks assessed                             Pertinent Vitals/Pain Pain Assessment: 0-10 Pain Score: 7  Pain Location: incisional and left leg Pain Descriptors / Indicators: Aching;Burning;Grimacing;Guarding Pain Intervention(s): Limited activity within patient's tolerance;Monitored during session;Repositioned           PT Goals (current goals can now be found in the care plan section) Acute Rehab PT Goals Patient Stated Goal: To feel better Progress towards PT goals: Not progressing toward goals - comment (limited by vertigo and L leg pain/weakness)    Frequency  Min 5X/week    PT Plan Discharge plan needs to be updated     Co-evaluation PT/OT/SLP Co-Evaluation/Treatment: Yes Reason for Co-Treatment: Complexity of the patient's impairments (multi-system involvement);Other (comment) (increase effectiveness of vest assessment, decrease pt pain) PT goals addressed during session: Mobility/safety with mobility;Balance;Proper use of DME       End of Session Equipment Utilized During Treatment: Back brace Activity Tolerance: No increased pain;Other (comment) (limited by dizziness) Patient left: in chair;with call bell/phone within reach;with family/visitor present     Time: [id number] PT Time Calculation (min) (ACUTE ONLY): 38 min  Charges:  $Therapeutic Activity: 8-22 mins $Canalith Rep Proc: 8-22 mins                     Abigail [name], PT, DPT [phone]   05/06/2016, 12:44 PM

## 2016-05-06 NOTE — Progress Notes (Signed)
Report called in report to facility about patient coming to rehab. Update information given to facility nurse.

## 2016-05-06 NOTE — Clinical Social Work Note (Signed)
Patient will discharge today per MD order. Patient will discharge to: [facility] RN to call report prior to transportation to: [phone] Transportation: [facility] to be scheduled for 2pm  CSW sent discharge summary to SNF for review.    Vickii PennaGina Terease Marcotte, LCSW [phone]  Licensed Clinical Social Worker

## 2016-05-06 NOTE — Progress Notes (Signed)
CM received a call from bedside RN, who shared concerns regarding patient's discharge plan. Per RN, patient does not appear to be progressing as well as she was yesterday.  CM spoke with Lurena Joinerebecca with PT to request an expedited evaluation to help ensure a safe discharge plan.  Almira CoasterGina, CSW was notified.

## 2016-05-08 ENCOUNTER — Other Ambulatory Visit: Payer: Self-pay | Admitting: Internal Medicine

## 2016-05-09 ENCOUNTER — Telehealth: Payer: Self-pay | Admitting: Neurology

## 2016-05-09 NOTE — Telephone Encounter (Signed)
Pt called in after having back surgery, she is [facility] for [facility] and needs an order for her to start clopidogrel (PLAVIX) 75 MG tablet back again. May call [phone]; Fax: [phone] attn: [facility]

## 2016-05-09 NOTE — Telephone Encounter (Signed)
Rn [facility]. [facility]. Rn explain we did not prescribed the medication we just sent the neuro clearance for her to have the surgery. Rn stated the plavix was order by her PCP. Rn stated the patient would need to contact the Md who did her surgery on went to start the plavix back. Jenette verbalized understanding.

## 2016-05-14 ENCOUNTER — Other Ambulatory Visit: Payer: Self-pay | Admitting: Internal Medicine

## 2016-05-15 NOTE — Telephone Encounter (Signed)
Okay to refill? 

## 2016-05-18 ENCOUNTER — Telehealth: Payer: Self-pay | Admitting: Family Medicine

## 2016-05-18 NOTE — Telephone Encounter (Signed)
Rx Tramadol was for 90 tablets and 2 refills.

## 2016-05-18 NOTE — Telephone Encounter (Signed)
[facility] Night - Client TELEPHONE ADVICE RECORD [facility] Patient Name: Otilio ConnorsLYNN Mantei Gender: Female DOB: 06/26/1946 Age: [age] Y 23 D Return Phone Number: Address: City/State/Zip: [place] [facility] - Client Client Site  [facility] - Night Physician Derryl HarborKwiatkowski, Pete - MD Contact Type Call Who Is Calling Pharmacy Call Type Pharmacy Send to RN Chief Complaint Prescription Refill or Medication Request (non symptomatic) Reason for Call Request to clarify medication order Initial Comment [facility] is with [facility], needs to verify prescription, needs to know how many refills need to go on the RX Additional Comment [facility] Number [phone] Translation No Nurse Assessment Nurse: Anner CreteWells, RN, Massie BougieBelinda Date/Time (Eastern Time): 05/17/2016 6:14:42 PM Please select the assessment type ---Pharmacy clarification Additional Documentation ---Caller is Danford BadKristie with [facility], needing to verify prescription refills are to go on the prescription. Is there an on-call physician for the client? ---Yes Do the client directives allow paging the on call for medication concerns? ---Yes Document information that requires clarification. ---Tramadol prescription was for 90 tablets but needs to know how many refills are to be placed on the prescription. Prescribing PCP is Eleonore ChiquitoPeter Kwiatkowski. Additional Documentation ---Advised caller that the on call providers do not handle medication issues after hours unless they are urgent matters. The nurse will send the message into the office for someone to contact the pharmacy tomorrow regarding the clarification of refills for this prescription. Guidelines Guideline Title Affirmed Question Affirmed Notes Nurse Date/Time (Eastern Time) Disp. Time Lamount Cohen(Eastern Time) Disposition Final User 05/17/2016 6:18:38 PM Clinical Call Yes  Anner CreteWells, RN, Massie BougieBelinda

## 2016-05-18 NOTE — Telephone Encounter (Signed)
Called and spoke to StarksBetty at pharmacy.  Informed her prescription was for #90 with 2 additional refills.

## 2016-06-03 ENCOUNTER — Other Ambulatory Visit: Payer: Self-pay | Admitting: Internal Medicine

## 2016-06-18 ENCOUNTER — Other Ambulatory Visit: Payer: Self-pay | Admitting: Internal Medicine

## 2016-06-29 ENCOUNTER — Other Ambulatory Visit: Payer: Self-pay | Admitting: Cardiology

## 2016-06-29 DIAGNOSIS — R072 Precordial pain: Secondary | ICD-10-CM

## 2016-06-29 NOTE — Telephone Encounter (Signed)
Dr. Crenshaw patient  

## 2016-07-11 ENCOUNTER — Other Ambulatory Visit: Payer: Self-pay | Admitting: Internal Medicine

## 2016-07-11 DIAGNOSIS — Z1231 Encounter for screening mammogram for malignant neoplasm of breast: Secondary | ICD-10-CM

## 2016-07-12 ENCOUNTER — Other Ambulatory Visit: Payer: Self-pay | Admitting: Internal Medicine

## 2016-07-20 ENCOUNTER — Telehealth: Payer: Self-pay | Admitting: *Deleted

## 2016-07-20 MED ORDER — MECLIZINE HCL 25 MG PO TABS: 25.0000 mg | ORAL_TABLET | Freq: Three times a day (TID) | ORAL | 2 refills | Status: DC | PRN

## 2016-07-20 NOTE — Telephone Encounter (Signed)
Pt called asking for refill on Meclizine 25 mg having some vertigo. Told her will send Rx to pharmacy. Pt verbalized understanding. Rx sent.

## 2016-07-21 ENCOUNTER — Other Ambulatory Visit: Payer: Self-pay | Admitting: Internal Medicine

## 2016-07-31 ENCOUNTER — Other Ambulatory Visit: Payer: Self-pay | Admitting: Cardiology

## 2016-07-31 DIAGNOSIS — R072 Precordial pain: Secondary | ICD-10-CM

## 2016-08-03 ENCOUNTER — Ambulatory Visit
Admission: RE | Admit: 2016-08-03 | Discharge: 2016-08-03 | Disposition: A | Discharge status: Home / Self Care | Payer: Medicare Other | Source: Ambulatory Visit | Attending: Internal Medicine | Admitting: Internal Medicine

## 2016-08-03 DIAGNOSIS — Z1231 Encounter for screening mammogram for malignant neoplasm of breast: Secondary | ICD-10-CM

## 2016-08-03 NOTE — Telephone Encounter (Signed)
Rx has been sent to the pharmacy electronically. ° °

## 2016-08-28 ENCOUNTER — Other Ambulatory Visit: Payer: Self-pay | Admitting: Internal Medicine

## 2016-09-05 ENCOUNTER — Ambulatory Visit (INDEPENDENT_AMBULATORY_CARE_PROVIDER_SITE_OTHER): Payer: Medicare Other | Admitting: Family Medicine

## 2016-09-05 ENCOUNTER — Encounter: Payer: Self-pay | Admitting: Family Medicine

## 2016-09-05 VITALS — BP 133/80 | HR 58 | Ht 65.5 in | Wt 181.0 lb

## 2016-09-05 DIAGNOSIS — L723 Sebaceous cyst: Secondary | ICD-10-CM

## 2016-09-05 DIAGNOSIS — R143 Flatulence: Secondary | ICD-10-CM | POA: Diagnosis not present

## 2016-09-05 NOTE — Progress Notes (Signed)
Pre visit review using our clinic review tool, if applicable. No additional management support is needed unless otherwise documented below in the visit note. 

## 2016-09-05 NOTE — Progress Notes (Signed)
   Subjective:    Patient ID: Abigail [name], female    DOB: 02/04/1946, 70 y.o.   MRN: [id number]  HPI Here for advice on 2 issues. First she has had a constant oozing of foul smelling fluid from the navel for the past 3 months. She has had 3 surgeries in this area, including one umbilical hernia repair and 2 ventral hernia repairs. She saw Dr. Johna SheriffHoxworth recently who told her this was a cyst and the only treatment would be to remove the entire navel. She asks me for a second opinion. Also she has had a lot of gas and flatus from the bowels for several months and she asks advice. Her BMs are regualr, no abdominal pain.    Review of Systems  Constitutional: Negative.   Respiratory: Negative.   Cardiovascular: Negative.   Gastrointestinal: Negative for abdominal distention, abdominal pain, anal bleeding, blood in stool, constipation, diarrhea, nausea, rectal pain and vomiting.  Neurological: Negative.        Objective:   Physical Exam  Constitutional: She is oriented to person, place, and time. She appears well-developed and well-nourished.  Cardiovascular: Normal rate, regular rhythm, normal heart sounds and intact distal pulses.   Pulmonary/Chest: Effort normal and breath sounds normal.  Abdominal: Soft. Bowel sounds are normal. She exhibits no distension and no mass. There is no tenderness. There is no rebound and no guarding.  The umbilicus appears normal except for a small amount of very foul smelling purulent DC. No tenderness   Neurological: She is alert and oriented to person, place, and time.          Assessment & Plan:  I agree that she has an umbilical cyst and that surgical removal would be the best treatment option. She will consider this and follow up with Dr. Johna SheriffHoxworth. For the bowel gas I suggested she try a probiotic daily like Align.  Nelwyn SalisburyFRY,STEPHEN A, MD u

## 2016-09-19 DIAGNOSIS — J40 Bronchitis, not specified as acute or chronic: Secondary | ICD-10-CM

## 2016-09-19 HISTORY — DX: Bronchitis, not specified as acute or chronic: J40

## 2016-09-23 ENCOUNTER — Ambulatory Visit: Payer: Medicare Other

## 2016-09-30 ENCOUNTER — Ambulatory Visit (INDEPENDENT_AMBULATORY_CARE_PROVIDER_SITE_OTHER): Payer: Medicare Other | Admitting: Internal Medicine

## 2016-09-30 ENCOUNTER — Encounter: Payer: Self-pay | Admitting: Internal Medicine

## 2016-09-30 VITALS — BP 124/76 | HR 60 | Temp 97.7°F | Ht 65.5 in | Wt 184.0 lb

## 2016-09-30 DIAGNOSIS — I10 Essential (primary) hypertension: Secondary | ICD-10-CM

## 2016-09-30 DIAGNOSIS — R198 Other specified symptoms and signs involving the digestive system and abdomen: Secondary | ICD-10-CM

## 2016-09-30 MED ORDER — BUTALBITAL-APAP-CAFFEINE 50-325-40 MG PO TABS: ORAL_TABLET | ORAL | 1 refills | Status: DC

## 2016-09-30 NOTE — Progress Notes (Signed)
Subjective:    Patient ID: Abigail [name], female    DOB: Jun 10, 1946, 71 y.o.   MRN: [id number]  HPI  71 year old patient who is seen today in follow-up.  She has a history of essential hypertension.  Her main concern is a peri Umbilical cyst. that drains periodically and is associated with a foul odor.  She has been evaluated by general surgery and is scheduled for follow-up next month.  There is no significant pain.  No constitutional complaints.  Her blood pressure has been stable.  Past Medical History:  Diagnosis Date  . Bruises easily   . DDD (degenerative disc disease)   . Degenerative joint disease of cervical spine 09-05-11   Cervival area, now some osteoarthritis-lower back and Rt. shoulder  . Depression   . Diverticulitis 09-05-11   hx. gastritis, diverticulitis x2 -now surgery planned  . DIVERTICULITIS, ACUTE 02/10/2010  . Essential hypertension 09-05-11   tx. Verapamil  . GROIN PAIN 09/21/2010  . Headache(784.0)    headaches are better  . Hearing loss   . Heart palpitations   . Hemorrhoids   . History of bronchitis   . History of colonic polyps   . History of pneumonia   . History of tension headache   . Hyperlipidemia   . Hypertension   . Hypothyroidism 09-05-11   Supplement used  . Late effect of adverse effect of drug, medicinal or biological substance   . NECK PAIN, ACUTE 09/21/2010  . Nocturia   . Osteoarthritis 112-17-12   spine and rt. hip, rt. shoulder  . Patent foramen ovale    Small - unable to be closed -   . PONV (postoperative nausea and vomiting)   . Sleep apnea 09-05-11   no cpap ever, had surgery to remove cartilage, no problems now  . SORE THROAT 04/30/2009  . Stroke (HCC) 09-05-11   2006/2009-(loss of memory, balance issues remains occ.)  . TIA 09/28/2007, 10/2014  . Unstable angina (HCC)    a. 05/2010 Cath: nl cors, EF 55%;  b. 05/2015 Lexiscan MV: small, severe, fixed apical defect and a small, severe, reversible inf lateral defect w/ apical  thinning and mild ischemia, EF 54%.  Marland Kitchen URI 09/02/2008  . Vertigo      Social History   Social History  . Marital status: Married    Spouse name: N/A  . Number of children: N/A  . Years of education: N/A   Occupational History  . Not on file.   Social History Main Topics  . Smoking status: Former Smoker    Packs/day: 0.50    Years: 5.00    Types: Cigarettes    Quit date: 09/19/1968  . Smokeless tobacco: Never Used  . Alcohol use 1.2 oz/week    1 Cans of beer, 1 Shots of liquor per week     Comment: rarely  . Drug use: No  . Sexual activity: Yes   Other Topics Concern  . Not on file   Social History Narrative   Lives in [place] with her husband.  She does not routinely exercise.    Past Surgical History:  Procedure Laterality Date  . ABDOMINAL HYSTERECTOMY    . CARDIAC CATHETERIZATION N/A 06/01/2015   Procedure: Left Heart Cath and Coronary Angiography;  Surgeon: Kathleene Hazel, MD;  Location: [facility] INVASIVE CV LAB;  Service: Cardiovascular;  Laterality: N/A;  . COLON RESECTION  09/07/2011   Procedure: LAPAROSCOPIC SIGMOID COLON RESECTION;  Surgeon: Mariella Saa, MD;  Location: [facility];  Service: General;  Laterality: N/A;  with proctoscopy  . ELBOW SURGERY  09-05-11   left elbow -ligament repair  . EYE SURGERY     cataract  . INSERTION OF MESH N/A 12/13/2013   Procedure: INSERTION OF MESH;  Surgeon: Mariella Saa, MD;  Location: [facility]S;  Service: General;  Laterality: N/A;  . LAPAROSCOPY  09/07/2011   Procedure: LAPAROSCOPY DIAGNOSTIC;  Surgeon: Roselle Locus II;  Location: [facility];  Service: Gynecology;  Laterality: N/A;  . MAXIMUM ACCESS (MAS)POSTERIOR LUMBAR INTERBODY FUSION (PLIF) 2 LEVEL N/A 05/03/2016   Procedure: L4-5 L5-S1 Maximum access posterior lumbar interbody fusion;  Surgeon: Maeola Harman, MD;  Location: [facility]URO [facility];  Service: Neurosurgery;  Laterality: N/A;  L4-5 L5-S1 Maximum access posterior lumbar interbody fusion  .  SALPINGOOPHORECTOMY  09/07/2011   Procedure: SALPINGO OOPHERECTOMY;  Surgeon: Roselle Locus II;  Location: [facility];  Service: Gynecology;  Laterality: Right;  . THYROIDECTOMY, PARTIAL  09-05-11  . TUBAL LIGATION  1983  . UMBILICAL HERNIA REPAIR  yrs ago  . VENTRAL HERNIA REPAIR  06/20/2012   Procedure: LAPAROSCOPIC VENTRAL HERNIA;  Surgeon: Mariella Saa, MD;  Location: [facility];  Service: General;  Laterality: N/A;  Laparoscopic Repair of Ventral Hernia with mesh  . VENTRAL HERNIA REPAIR N/A 12/13/2013   Procedure: LAPAROSCOPIC REPAIR RECURRENT VENTRAL INCISIONAL  HERNIA;  Surgeon: Mariella Saa, MD;  Location: [facility];  Service: General;  Laterality: N/A;    Family History  Problem Relation Age of Onset  . Arrhythmia Mother   . Hypertension Mother   . Stroke Mother   . Stroke Maternal Grandmother   . Diabetes Paternal Grandmother     Allergies  Allergen Reactions  . Dilaudid [Hydromorphone Hcl] Shortness Of Breath, Nausea And Vomiting and Other (See Comments)    Able to take morphine without issue  . Shellfish Allergy Shortness Of Breath and Rash    Only shrimp allergy  . Aggrenox [Aspirin-Dipyridamole Er] Nausea And Vomiting and Other (See Comments)    Headache   . Cephalexin Itching and Rash  . Codeine Phosphate Nausea And Vomiting  . Contrast Media [Iodinated Diagnostic Agents] Nausea And Vomiting  . Hydrocodone Nausea And Vomiting and Other (See Comments)    Can take morphine without issue  . Meperidine Hcl Nausea And Vomiting  . Percocet [Oxycodone-Acetaminophen] Nausea And Vomiting and Other (See Comments)    Can take morphine without issue    Current Outpatient Prescriptions on File Prior to Visit  Medication Sig Dispense Refill  . atorvastatin (LIPITOR) 40 MG tablet TAKE 1 TABLET BY MOUTH EVERY DAY 90 tablet 1  . BIOTIN PO Take 1 tablet by mouth daily.    . clopidogrel (PLAVIX) 75 MG tablet Take 1 tablet (75 mg total) by mouth every evening. 90 tablet 1    . levothyroxine (SYNTHROID, LEVOTHROID) 125 MCG tablet TAKE 1 TABLET BY MOUTH DAILY BEFORE BREAKFAST. 90 tablet 0  . lisinopril-hydrochlorothiazide (PRINZIDE,ZESTORETIC) 20-12.5 MG tablet TAKE 1 TABLET BY MOUTH DAILY. 90 tablet 1  . meclizine (ANTIVERT) 25 MG tablet Take 1 tablet (25 mg total) by mouth 3 (three) times daily as needed for dizziness. 30 tablet 2  . tetrahydrozoline 0.05 % ophthalmic solution Place 2 drops into both eyes as needed (for red eyes). Reported on 02/12/2016    . traMADol (ULTRAM) 50 MG tablet TAKE 1 TABLET BY MOUTH EVERY 6 HOURS AS NEEDED FOR PAIN 90 tablet 2  . verapamil (VERELAN PM) 240 MG  24 hr capsule TAKE 1 CAPSULE (240 MG TOTAL) BY MOUTH AT BEDTIME. 30 capsule 3   No current facility-administered medications on file prior to visit.     BP 124/76 (BP Location: Right Arm, Patient Position: Sitting, Cuff Size: Normal)   Pulse 60   Temp 97.7 F (36.5 C) (Oral)   Ht 5' 5.5" (1.664 m)   Wt 184 lb (83.5 kg)   SpO2 98%   BMI 30.15 kg/m     Review of Systems  Constitutional: Negative.   HENT: Negative for congestion, dental problem, hearing loss, rhinorrhea, sinus pressure, sore throat and tinnitus.   Eyes: Negative for pain, discharge and visual disturbance.  Respiratory: Negative for cough and shortness of breath.   Cardiovascular: Negative for chest pain, palpitations and leg swelling.  Gastrointestinal: Negative for abdominal distention, abdominal pain, blood in stool, constipation, diarrhea, nausea and vomiting.  Genitourinary: Negative for difficulty urinating, dysuria, flank pain, frequency, hematuria, pelvic pain, urgency, vaginal bleeding, vaginal discharge and vaginal pain.  Musculoskeletal: Negative for arthralgias, gait problem and joint swelling.  Skin: Positive for wound. Negative for rash.  Neurological: Negative for dizziness, syncope, speech difficulty, weakness, numbness and headaches.  Hematological: Negative for adenopathy.   Psychiatric/Behavioral: Negative for agitation, behavioral problems and dysphoric mood. The patient is not nervous/anxious.        Objective:   Physical Exam  Constitutional: She appears well-developed and well-nourished. She appears distressed.  Abdominal: Soft. Bowel sounds are normal.  There is no erythema, pain or swelling about the umbilicus.  Small surgical scars noted          Assessment & Plan:   PeriUmbilical cyst.  This remains symptomatic.  The patient is scheduled for surgical follow-up and elective repair Essential hypertension  Medications updated The patient is now very anxious to have this issue surgically resolved.  She has called the general surgeon's office for an earlier appointment and this has been moved up some.  She was to call next week and ask for a cancellation appointment if one becomes available  Rogelia BogaKWIATKOWSKI,PETER FRANK

## 2016-09-30 NOTE — Patient Instructions (Signed)
Limit your sodium (Salt) intake  Please check your blood pressure on a regular basis.  If it is consistently greater than 150/90, please make an office appointment.  Return in 6 months for follow-up  Follow with general surgery as scheduled

## 2016-09-30 NOTE — Progress Notes (Signed)
Pre visit review using our clinic review tool, if applicable. No additional management support is needed unless otherwise documented below in the visit note. 

## 2016-10-06 ENCOUNTER — Ambulatory Visit: Payer: Medicare Other

## 2016-10-07 ENCOUNTER — Telehealth: Payer: Self-pay | Admitting: Internal Medicine

## 2016-10-10 ENCOUNTER — Telehealth: Payer: Self-pay

## 2016-10-10 ENCOUNTER — Other Ambulatory Visit: Payer: Self-pay | Admitting: Internal Medicine

## 2016-10-10 NOTE — Telephone Encounter (Signed)
Received forms from insurance company on Fioricet. Forms filled out & faxed back to insurance company. Awaiting approval or denial.

## 2016-10-11 NOTE — Telephone Encounter (Signed)
See message below, please advise.

## 2016-10-11 NOTE — Telephone Encounter (Signed)
PA denied. The plan has a quantity limit of 6 tablets per day. With the way the directions are, patient can take up to 8 tabs per day.

## 2016-10-12 ENCOUNTER — Other Ambulatory Visit: Payer: Self-pay | Admitting: Internal Medicine

## 2016-10-14 ENCOUNTER — Ambulatory Visit (INDEPENDENT_AMBULATORY_CARE_PROVIDER_SITE_OTHER): Payer: Medicare Other | Admitting: Internal Medicine

## 2016-10-14 ENCOUNTER — Encounter: Payer: Self-pay | Admitting: Internal Medicine

## 2016-10-14 VITALS — BP 116/68 | HR 64 | Temp 98.2°F | Ht 65.5 in | Wt 188.6 lb

## 2016-10-14 DIAGNOSIS — M545 Low back pain, unspecified: Secondary | ICD-10-CM

## 2016-10-14 DIAGNOSIS — G8929 Other chronic pain: Secondary | ICD-10-CM

## 2016-10-14 DIAGNOSIS — I10 Essential (primary) hypertension: Secondary | ICD-10-CM | POA: Diagnosis not present

## 2016-10-14 MED ORDER — TRAMADOL HCL 50 MG PO TABS: 50.0000 mg | ORAL_TABLET | Freq: Four times a day (QID) | ORAL | 1 refills | Status: DC | PRN

## 2016-10-14 MED ORDER — GABAPENTIN 100 MG PO CAPS: 100.0000 mg | ORAL_CAPSULE | Freq: Two times a day (BID) | ORAL | 3 refills | Status: DC

## 2016-10-14 NOTE — Patient Instructions (Signed)
Limit your sodium (Salt) intake  Neurosurgery follow-up  Please check your blood pressure on a regular basis.  If it is consistently greater than 150/90, please make an office appointment.  Use Fioricet as needed for headaches only

## 2016-10-14 NOTE — Progress Notes (Signed)
Pre visit review using our clinic review tool, if applicable. No additional management support is needed unless otherwise documented below in the visit note. 

## 2016-10-14 NOTE — Progress Notes (Signed)
Subjective:    Patient ID: Abigail [name], female    DOB: August 08, 1946, 71 y.o.   MRN: [id number]  HPI  71 year old patient who is seen today for follow-up of chronic low back pain.  She is status post L4-5 laminectomy in August of last year.  She has been using tramadol and diclofenac and also Fioricet. She was on Neurontin.  Postop but this was tapered and discontinued. Past Medical History:  Diagnosis Date  . Bruises easily   . DDD (degenerative disc disease)   . Degenerative joint disease of cervical spine 09-05-11   Cervival area, now some osteoarthritis-lower back and Rt. shoulder  . Depression   . Diverticulitis 09-05-11   hx. gastritis, diverticulitis x2 -now surgery planned  . DIVERTICULITIS, ACUTE 02/10/2010  . Essential hypertension 09-05-11   tx. Verapamil  . GROIN PAIN 09/21/2010  . Headache(784.0)    headaches are better  . Hearing loss   . Heart palpitations   . Hemorrhoids   . History of bronchitis   . History of colonic polyps   . History of pneumonia   . History of tension headache   . Hyperlipidemia   . Hypertension   . Hypothyroidism 09-05-11   Supplement used  . Late effect of adverse effect of drug, medicinal or biological substance   . NECK PAIN, ACUTE 09/21/2010  . Nocturia   . Osteoarthritis 112-17-12   spine and rt. hip, rt. shoulder  . Patent foramen ovale    Small - unable to be closed -   . PONV (postoperative nausea and vomiting)   . Sleep apnea 09-05-11   no cpap ever, had surgery to remove cartilage, no problems now  . SORE THROAT 04/30/2009  . Stroke (HCC) 09-05-11   2006/2009-(loss of memory, balance issues remains occ.)  . TIA 09/28/2007, 10/2014  . Unstable angina (HCC)    a. 05/2010 Cath: nl cors, EF 55%;  b. 05/2015 Lexiscan MV: small, severe, fixed apical defect and a small, severe, reversible inf lateral defect w/ apical thinning and mild ischemia, EF 54%.  Marland Kitchen URI 09/02/2008  . Vertigo      Social History   Social History  . Marital  status: Married    Spouse name: N/A  . Number of children: N/A  . Years of education: N/A   Occupational History  . Not on file.   Social History Main Topics  . Smoking status: Former Smoker    Packs/day: 0.50    Years: 5.00    Types: Cigarettes    Quit date: 09/19/1968  . Smokeless tobacco: Never Used  . Alcohol use 1.2 oz/week    1 Cans of beer, 1 Shots of liquor per week     Comment: rarely  . Drug use: No  . Sexual activity: Yes   Other Topics Concern  . Not on file   Social History Narrative   Lives in [place] with her husband.  She does not routinely exercise.    Past Surgical History:  Procedure Laterality Date  . ABDOMINAL HYSTERECTOMY    . CARDIAC CATHETERIZATION N/A 06/01/2015   Procedure: Left Heart Cath and Coronary Angiography;  Surgeon: Kathleene Hazel, MD;  Location: [facility] INVASIVE CV LAB;  Service: Cardiovascular;  Laterality: N/A;  . COLON RESECTION  09/07/2011   Procedure: LAPAROSCOPIC SIGMOID COLON RESECTION;  Surgeon: Mariella Saa, MD;  Location: [facility];  Service: General;  Laterality: N/A;  with proctoscopy  . ELBOW SURGERY  09-05-11   left elbow -  ligament repair  . EYE SURGERY     cataract  . INSERTION OF MESH N/A 12/13/2013   Procedure: INSERTION OF MESH;  Surgeon: Mariella Saa, MD;  Location: [facility];  Service: General;  Laterality: N/A;  . LAPAROSCOPY  09/07/2011   Procedure: LAPAROSCOPY DIAGNOSTIC;  Surgeon: Roselle Locus II;  Location: [facility];  Service: Gynecology;  Laterality: N/A;  . MAXIMUM ACCESS (MAS)POSTERIOR LUMBAR INTERBODY FUSION (PLIF) 2 LEVEL N/A 05/03/2016   Procedure: L4-5 L5-S1 Maximum access posterior lumbar interbody fusion;  Surgeon: Maeola Harman, MD;  Location: [facility];  Service: Neurosurgery;  Laterality: N/A;  L4-5 L5-S1 Maximum access posterior lumbar interbody fusion  . SALPINGOOPHORECTOMY  09/07/2011   Procedure: SALPINGO OOPHERECTOMY;  Surgeon: Roselle Locus II;  Location: [facility];  Service:  Gynecology;  Laterality: Right;  . THYROIDECTOMY, PARTIAL  09-05-11  . TUBAL LIGATION  1983  . UMBILICAL HERNIA REPAIR  yrs ago  . VENTRAL HERNIA REPAIR  06/20/2012   Procedure: LAPAROSCOPIC VENTRAL HERNIA;  Surgeon: Mariella Saa, MD;  Location: [facility];  Service: General;  Laterality: N/A;  Laparoscopic Repair of Ventral Hernia with mesh  . VENTRAL HERNIA REPAIR N/A 12/13/2013   Procedure: LAPAROSCOPIC REPAIR RECURRENT VENTRAL INCISIONAL  HERNIA;  Surgeon: Mariella Saa, MD;  Location: [facility];  Service: General;  Laterality: N/A;    Family History  Problem Relation Age of Onset  . Arrhythmia Mother   . Hypertension Mother   . Stroke Mother   . Stroke Maternal Grandmother   . Diabetes Paternal Grandmother     Allergies  Allergen Reactions  . Dilaudid [Hydromorphone Hcl] Shortness Of Breath, Nausea And Vomiting and Other (See Comments)    Able to take morphine without issue  . Shellfish Allergy Shortness Of Breath and Rash    Only shrimp allergy  . Aggrenox [Aspirin-Dipyridamole Er] Nausea And Vomiting and Other (See Comments)    Headache   . Cephalexin Itching and Rash  . Codeine Phosphate Nausea And Vomiting  . Contrast Media [Iodinated Diagnostic Agents] Nausea And Vomiting  . Hydrocodone Nausea And Vomiting and Other (See Comments)    Can take morphine without issue  . Meperidine Hcl Nausea And Vomiting  . Percocet [Oxycodone-Acetaminophen] Nausea And Vomiting and Other (See Comments)    Can take morphine without issue    Current Outpatient Prescriptions on File Prior to Visit  Medication Sig Dispense Refill  . atorvastatin (LIPITOR) 40 MG tablet TAKE 1 TABLET BY MOUTH EVERY DAY 90 tablet 1  . BIOTIN PO Take 1 tablet by mouth daily.    . butalbital-acetaminophen-caffeine (FIORICET, ESGIC) 50-325-40 MG tablet TAKE 1 TO 2 TABLETS BY MOUTH EVERY 6 HOURS AS NEEDED FOR HEADACHE 120 tablet 1  . clopidogrel (PLAVIX) 75 MG tablet Take 1 tablet (75 mg total) by mouth  every evening. 90 tablet 1  . diclofenac (VOLTAREN) 75 MG EC tablet Take 75 mg by mouth 2 (two) times daily.  1  . levothyroxine (SYNTHROID, LEVOTHROID) 125 MCG tablet TAKE 1 TABLET BY MOUTH DAILY BEFORE BREAKFAST. 90 tablet 0  . lisinopril-hydrochlorothiazide (PRINZIDE,ZESTORETIC) 20-12.5 MG tablet TAKE 1 TABLET BY MOUTH DAILY. 90 tablet 1  . tetrahydrozoline 0.05 % ophthalmic solution Place 2 drops into both eyes as needed (for red eyes). Reported on 02/12/2016    . traMADol (ULTRAM) 50 MG tablet TAKE 1 TABLET BY MOUTH EVERY 6 HOURS AS NEEDED 90 tablet 1  . verapamil (VERELAN PM) 240 MG 24 hr capsule TAKE 1  CAPSULE (240 MG TOTAL) BY MOUTH AT BEDTIME. 30 capsule 3   No current facility-administered medications on file prior to visit.     BP 116/68 (BP Location: Left Arm, Patient Position: Sitting, Cuff Size: Normal)   Pulse 64   Temp 98.2 F (36.8 C) (Oral)   Ht 5' 5.5" (1.664 m)   Wt 188 lb 9.6 oz (85.5 kg)   SpO2 98%   BMI 30.91 kg/m     Review of Systems  Constitutional: Negative.   HENT: Negative for congestion, dental problem, hearing loss, rhinorrhea, sinus pressure, sore throat and tinnitus.   Eyes: Negative for pain, discharge and visual disturbance.  Respiratory: Negative for cough and shortness of breath.   Cardiovascular: Negative for chest pain, palpitations and leg swelling.  Gastrointestinal: Negative for abdominal distention, abdominal pain, blood in stool, constipation, diarrhea, nausea and vomiting.  Genitourinary: Negative for difficulty urinating, dysuria, flank pain, frequency, hematuria, pelvic pain, urgency, vaginal bleeding, vaginal discharge and vaginal pain.  Musculoskeletal: Positive for back pain. Negative for arthralgias, gait problem and joint swelling.  Skin: Negative for rash.  Neurological: Positive for headaches. Negative for dizziness, syncope, speech difficulty, weakness and numbness.  Hematological: Negative for adenopathy.    Psychiatric/Behavioral: Negative for agitation, behavioral problems and dysphoric mood. The patient is not nervous/anxious.        Objective:   Physical Exam  Constitutional: She appears well-developed and well-nourished. No distress.  Cardiovascular: Normal rate and regular rhythm.   Pulmonary/Chest: Effort normal and breath sounds normal.  Musculoskeletal: Normal range of motion.          Assessment & Plan:   Essential hypertension Chronic low back pain.  The patient seemed to benefit postop from Neurontin.  Will resume Headache syndrome.  Patient has been told to minimize use of Fioricet and to use this only for headaches  Neurosurgery follow-up Follow-up.  3 months or as needed  Rogelia BogaKWIATKOWSKI,Annabeth Tortora FRANK

## 2016-10-18 ENCOUNTER — Other Ambulatory Visit: Payer: Self-pay | Admitting: Internal Medicine

## 2016-10-18 MED ORDER — BUTALBITAL-APAP-CAFFEINE 50-325-40 MG PO TABS: 1.0000 | ORAL_TABLET | Freq: Four times a day (QID) | ORAL | 1 refills | Status: DC | PRN

## 2016-10-20 ENCOUNTER — Ambulatory Visit: Payer: Self-pay | Admitting: General Surgery

## 2016-10-24 NOTE — Progress Notes (Deleted)
HPI: FU palpitations. She has a long history of palpitations and presyncope. Patient apparently noted to have a PFO on transesophageal echocardiogram in 2006 following CVA. Echocardiogram in September 2011 showed normal LV function, grade 1 diastolic dysfunction, mildly dilated descending aorta and trace aortic insufficiency. Event monitor in 2011 for palpitations showed sinus with PACs. Nuclear study September 2016 showed apical thinning and mild ischemia in the inferior lateral wall. Cardiac catheterization September 2016 showed no obstructive coronary disease and normal LV function. Since last seen,   Current Outpatient Prescriptions  Medication Sig Dispense Refill  . atorvastatin (LIPITOR) 40 MG tablet TAKE 1 TABLET BY MOUTH EVERY DAY 90 tablet 1  . BIOTIN PO Take 1 tablet by mouth daily.    . butalbital-acetaminophen-caffeine (FIORICET, ESGIC) 50-325-40 MG tablet Take 1 tablet by mouth every 6 (six) hours as needed for headache. 60 tablet 1  . clopidogrel (PLAVIX) 75 MG tablet Take 1 tablet (75 mg total) by mouth every evening. 90 tablet 1  . diclofenac (VOLTAREN) 75 MG EC tablet Take 75 mg by mouth 2 (two) times daily.  1  . gabapentin (NEURONTIN) 100 MG capsule Take 1 capsule (100 mg total) by mouth 2 (two) times daily. 180 capsule 3  . levothyroxine (SYNTHROID, LEVOTHROID) 125 MCG tablet TAKE 1 TABLET BY MOUTH DAILY BEFORE BREAKFAST. 90 tablet 0  . lisinopril-hydrochlorothiazide (PRINZIDE,ZESTORETIC) 20-12.5 MG tablet TAKE 1 TABLET BY MOUTH DAILY. 90 tablet 1  . tetrahydrozoline 0.05 % ophthalmic solution Place 2 drops into both eyes as needed (for red eyes). Reported on 02/12/2016    . traMADol (ULTRAM) 50 MG tablet Take 1 tablet (50 mg total) by mouth every 6 (six) hours as needed. 90 tablet 1  . verapamil (VERELAN PM) 240 MG 24 hr capsule TAKE 1 CAPSULE (240 MG TOTAL) BY MOUTH AT BEDTIME. 30 capsule 3   No current facility-administered medications for this visit.      Past  Medical History:  Diagnosis Date  . Bruises easily   . DDD (degenerative disc disease)   . Degenerative joint disease of cervical spine 09-05-11   Cervival area, now some osteoarthritis-lower back and Rt. shoulder  . Depression   . Diverticulitis 09-05-11   hx. gastritis, diverticulitis x2 -now surgery planned  . DIVERTICULITIS, ACUTE 02/10/2010  . Essential hypertension 09-05-11   tx. Verapamil  . GROIN PAIN 09/21/2010  . Headache(784.0)    headaches are better  . Hearing loss   . Heart palpitations   . Hemorrhoids   . History of bronchitis   . History of colonic polyps   . History of pneumonia   . History of tension headache   . Hyperlipidemia   . Hypertension   . Hypothyroidism 09-05-11   Supplement used  . Late effect of adverse effect of drug, medicinal or biological substance   . NECK PAIN, ACUTE 09/21/2010  . Nocturia   . Osteoarthritis 112-17-12   spine and rt. hip, rt. shoulder  . Patent foramen ovale    Small - unable to be closed -   . PONV (postoperative nausea and vomiting)   . Sleep apnea 09-05-11   no cpap ever, had surgery to remove cartilage, no problems now  . SORE THROAT 04/30/2009  . Stroke (HCC) 09-05-11   2006/2009-(loss of memory, balance issues remains occ.)  . TIA 09/28/2007, 10/2014  . Unstable angina (HCC)    a. 05/2010 Cath: nl cors, EF 55%;  b. 05/2015 Lexiscan MV: small, severe, fixed apical  defect and a small, severe, reversible inf lateral defect w/ apical thinning and mild ischemia, EF 54%.  Marland Kitchen URI 09/02/2008  . Vertigo     Past Surgical History:  Procedure Laterality Date  . ABDOMINAL HYSTERECTOMY    . CARDIAC CATHETERIZATION N/A 06/01/2015   Procedure: Left Heart Cath and Coronary Angiography;  Surgeon: Kathleene Hazel, MD;  Location: [facility] INVASIVE CV LAB;  Service: Cardiovascular;  Laterality: N/A;  . COLON RESECTION  09/07/2011   Procedure: LAPAROSCOPIC SIGMOID COLON RESECTION;  Surgeon: Mariella Saa, MD;  Location: [facility];   Service: General;  Laterality: N/A;  with proctoscopy  . ELBOW SURGERY  09-05-11   left elbow -ligament repair  . EYE SURGERY     cataract  . INSERTION OF MESH N/A 12/13/2013   Procedure: INSERTION OF MESH;  Surgeon: Mariella Saa, MD;  Location: [facility];  Service: General;  Laterality: N/A;  . LAPAROSCOPY  09/07/2011   Procedure: LAPAROSCOPY DIAGNOSTIC;  Surgeon: Roselle Locus II;  Location: [facility];  Service: Gynecology;  Laterality: N/A;  . MAXIMUM ACCESS (MAS)POSTERIOR LUMBAR INTERBODY FUSION (PLIF) 2 LEVEL N/A 05/03/2016   Procedure: L4-5 L5-S1 Maximum access posterior lumbar interbody fusion;  Surgeon: Maeola Harman, MD;  Location: [facility];  Service: Neurosurgery;  Laterality: N/A;  L4-5 L5-S1 Maximum access posterior lumbar interbody fusion  . SALPINGOOPHORECTOMY  09/07/2011   Procedure: SALPINGO OOPHERECTOMY;  Surgeon: Roselle Locus II;  Location: [facility];  Service: Gynecology;  Laterality: Right;  . THYROIDECTOMY, PARTIAL  09-05-11  . TUBAL LIGATION  1983  . UMBILICAL HERNIA REPAIR  yrs ago  . VENTRAL HERNIA REPAIR  06/20/2012   Procedure: LAPAROSCOPIC VENTRAL HERNIA;  Surgeon: Mariella Saa, MD;  Location: [facility];  Service: General;  Laterality: N/A;  Laparoscopic Repair of Ventral Hernia with mesh  . VENTRAL HERNIA REPAIR N/A 12/13/2013   Procedure: LAPAROSCOPIC REPAIR RECURRENT VENTRAL INCISIONAL  HERNIA;  Surgeon: Mariella Saa, MD;  Location: [facility];  Service: General;  Laterality: N/A;    Social History   Social History  . Marital status: Married    Spouse name: N/A  . Number of children: N/A  . Years of education: N/A   Occupational History  . Not on file.   Social History Main Topics  . Smoking status: Former Smoker    Packs/day: 0.50    Years: 5.00    Types: Cigarettes    Quit date: 09/19/1968  . Smokeless tobacco: Never Used  . Alcohol use 1.2 oz/week    1 Cans of beer, 1 Shots of liquor per week     Comment: rarely  . Drug use: No  .  Sexual activity: Yes   Other Topics Concern  . Not on file   Social History Narrative   Lives in [place] with her husband.  She does not routinely exercise.    Family History  Problem Relation Age of Onset  . Arrhythmia Mother   . Hypertension Mother   . Stroke Mother   . Stroke Maternal Grandmother   . Diabetes Paternal Grandmother     ROS: no fevers or chills, productive cough, hemoptysis, dysphasia, odynophagia, melena, hematochezia, dysuria, hematuria, rash, seizure activity, orthopnea, PND, pedal edema, claudication. Remaining systems are negative.  Physical Exam: Well-developed well-nourished in no acute distress.  Skin is warm and dry.  HEENT is normal.  Neck is supple.  Chest is clear to auscultation with normal expansion.  Cardiovascular exam is regular rate and rhythm.  Abdominal exam nontender or distended. No masses palpated. Extremities show no edema. neuro grossly intact  ECG

## 2016-10-25 ENCOUNTER — Ambulatory Visit (INDEPENDENT_AMBULATORY_CARE_PROVIDER_SITE_OTHER): Payer: Medicare Other | Admitting: Internal Medicine

## 2016-10-25 ENCOUNTER — Encounter: Payer: Self-pay | Admitting: Internal Medicine

## 2016-10-25 VITALS — BP 132/70 | HR 71 | Temp 98.8°F | Ht 65.0 in | Wt 186.0 lb

## 2016-10-25 DIAGNOSIS — J3489 Other specified disorders of nose and nasal sinuses: Secondary | ICD-10-CM

## 2016-10-25 DIAGNOSIS — J069 Acute upper respiratory infection, unspecified: Secondary | ICD-10-CM | POA: Diagnosis not present

## 2016-10-25 DIAGNOSIS — I10 Essential (primary) hypertension: Secondary | ICD-10-CM | POA: Diagnosis not present

## 2016-10-25 LAB — POCT INFLUENZA A/B

## 2016-10-25 NOTE — Addendum Note (Signed)
Addended by: Neville RouteJAIMES, PATRICIA G on: 10/25/2016 12:15 PM   Modules accepted: Orders

## 2016-10-25 NOTE — Progress Notes (Signed)
Subjective:    Patient ID: Abigail [name], female    DOB: 06/05/1946, 71 y.o.   MRN: [id number]  HPI 71 year old patient who presents with a three-day history of sore throat, cough, congestion, some chills  She has persistent low back pain. Denies any wheezing or shortness of breath. She was at a birthday recently and one of the children did have an acute febrile illness  Past Medical History:  Diagnosis Date  . Bruises easily   . DDD (degenerative disc disease)   . Degenerative joint disease of cervical spine 09-05-11   Cervival area, now some osteoarthritis-lower back and Rt. shoulder  . Depression   . Diverticulitis 09-05-11   hx. gastritis, diverticulitis x2 -now surgery planned  . DIVERTICULITIS, ACUTE 02/10/2010  . Essential hypertension 09-05-11   tx. Verapamil  . GROIN PAIN 09/21/2010  . Headache(784.0)    headaches are better  . Hearing loss   . Heart palpitations   . Hemorrhoids   . History of bronchitis   . History of colonic polyps   . History of pneumonia   . History of tension headache   . Hyperlipidemia   . Hypertension   . Hypothyroidism 09-05-11   Supplement used  . Late effect of adverse effect of drug, medicinal or biological substance   . NECK PAIN, ACUTE 09/21/2010  . Nocturia   . Osteoarthritis 112-17-12   spine and rt. hip, rt. shoulder  . Patent foramen ovale    Small - unable to be closed -   . PONV (postoperative nausea and vomiting)   . Sleep apnea 09-05-11   no cpap ever, had surgery to remove cartilage, no problems now  . SORE THROAT 04/30/2009  . Stroke (HCC) 09-05-11   2006/2009-(loss of memory, balance issues remains occ.)  . TIA 09/28/2007, 10/2014  . Unstable angina (HCC)    a. 05/2010 Cath: nl cors, EF 55%;  b. 05/2015 Lexiscan MV: small, severe, fixed apical defect and a small, severe, reversible inf lateral defect w/ apical thinning and mild ischemia, EF 54%.  Marland Kitchen URI 09/02/2008  . Vertigo      Social History   Social History  .  Marital status: Married    Spouse name: N/A  . Number of children: N/A  . Years of education: N/A   Occupational History  . Not on file.   Social History Main Topics  . Smoking status: Former Smoker    Packs/day: 0.50    Years: 5.00    Types: Cigarettes    Quit date: 09/19/1968  . Smokeless tobacco: Never Used  . Alcohol use 1.2 oz/week    1 Cans of beer, 1 Shots of liquor per week     Comment: rarely  . Drug use: No  . Sexual activity: Yes   Other Topics Concern  . Not on file   Social History Narrative   Lives in [place] with her husband.  She does not routinely exercise.    Past Surgical History:  Procedure Laterality Date  . ABDOMINAL HYSTERECTOMY    . CARDIAC CATHETERIZATION N/A 06/01/2015   Procedure: Left Heart Cath and Coronary Angiography;  Surgeon: Kathleene Hazel, MD;  Location: [facility] INVASIVE CV LAB;  Service: Cardiovascular;  Laterality: N/A;  . COLON RESECTION  09/07/2011   Procedure: LAPAROSCOPIC SIGMOID COLON RESECTION;  Surgeon: Mariella Saa, MD;  Location: [facility];  Service: General;  Laterality: N/A;  with proctoscopy  . ELBOW SURGERY  09-05-11   left elbow -ligament repair  .  EYE SURGERY     cataract  . INSERTION OF MESH N/A 12/13/2013   Procedure: INSERTION OF MESH;  Surgeon: Mariella Saa, MD;  Location: [facility];  Service: General;  Laterality: N/A;  . LAPAROSCOPY  09/07/2011   Procedure: LAPAROSCOPY DIAGNOSTIC;  Surgeon: Roselle Locus II;  Location: [facility];  Service: Gynecology;  Laterality: N/A;  . MAXIMUM ACCESS (MAS)POSTERIOR LUMBAR INTERBODY FUSION (PLIF) 2 LEVEL N/A 05/03/2016   Procedure: L4-5 L5-S1 Maximum access posterior lumbar interbody fusion;  Surgeon: Maeola Harman, MD;  Location: [facility];  Service: Neurosurgery;  Laterality: N/A;  L4-5 L5-S1 Maximum access posterior lumbar interbody fusion  . SALPINGOOPHORECTOMY  09/07/2011   Procedure: SALPINGO OOPHERECTOMY;  Surgeon: Roselle Locus II;  Location: [facility];  Service:  Gynecology;  Laterality: Right;  . THYROIDECTOMY, PARTIAL  09-05-11  . TUBAL LIGATION  1983  . UMBILICAL HERNIA REPAIR  yrs ago  . VENTRAL HERNIA REPAIR  06/20/2012   Procedure: LAPAROSCOPIC VENTRAL HERNIA;  Surgeon: Mariella Saa, MD;  Location: [facility];  Service: General;  Laterality: N/A;  Laparoscopic Repair of Ventral Hernia with mesh  . VENTRAL HERNIA REPAIR N/A 12/13/2013   Procedure: LAPAROSCOPIC REPAIR RECURRENT VENTRAL INCISIONAL  HERNIA;  Surgeon: Mariella Saa, MD;  Location: [facility];  Service: General;  Laterality: N/A;    Family History  Problem Relation Age of Onset  . Arrhythmia Mother   . Hypertension Mother   . Stroke Mother   . Stroke Maternal Grandmother   . Diabetes Paternal Grandmother     Allergies  Allergen Reactions  . Dilaudid [Hydromorphone Hcl] Shortness Of Breath, Nausea And Vomiting and Other (See Comments)    Able to take morphine without issue  . Shellfish Allergy Shortness Of Breath and Rash    Only shrimp allergy  . Aggrenox [Aspirin-Dipyridamole Er] Nausea And Vomiting and Other (See Comments)    Headache   . Cephalexin Itching and Rash  . Codeine Phosphate Nausea And Vomiting  . Contrast Media [Iodinated Diagnostic Agents] Nausea And Vomiting  . Hydrocodone Nausea And Vomiting and Other (See Comments)    Can take morphine without issue  . Meperidine Hcl Nausea And Vomiting  . Percocet [Oxycodone-Acetaminophen] Nausea And Vomiting and Other (See Comments)    Can take morphine without issue    Current Outpatient Prescriptions on File Prior to Visit  Medication Sig Dispense Refill  . atorvastatin (LIPITOR) 40 MG tablet TAKE 1 TABLET BY MOUTH EVERY DAY 90 tablet 1  . BIOTIN PO Take 1 tablet by mouth daily.    . butalbital-acetaminophen-caffeine (FIORICET, ESGIC) 50-325-40 MG tablet Take 1 tablet by mouth every 6 (six) hours as needed for headache. 60 tablet 1  . clopidogrel (PLAVIX) 75 MG tablet Take 1 tablet (75 mg total) by mouth  every evening. 90 tablet 1  . diclofenac (VOLTAREN) 75 MG EC tablet Take 75 mg by mouth 2 (two) times daily.  1  . gabapentin (NEURONTIN) 100 MG capsule Take 1 capsule (100 mg total) by mouth 2 (two) times daily. 180 capsule 3  . levothyroxine (SYNTHROID, LEVOTHROID) 125 MCG tablet TAKE 1 TABLET BY MOUTH DAILY BEFORE BREAKFAST. 90 tablet 0  . lisinopril-hydrochlorothiazide (PRINZIDE,ZESTORETIC) 20-12.5 MG tablet TAKE 1 TABLET BY MOUTH DAILY. 90 tablet 1  . tetrahydrozoline 0.05 % ophthalmic solution Place 2 drops into both eyes as needed (for red eyes). Reported on 02/12/2016    . traMADol (ULTRAM) 50 MG tablet Take 1 tablet (50 mg total) by mouth  every 6 (six) hours as needed. 90 tablet 1  . verapamil (VERELAN PM) 240 MG 24 hr capsule TAKE 1 CAPSULE (240 MG TOTAL) BY MOUTH AT BEDTIME. 30 capsule 3   No current facility-administered medications on file prior to visit.     BP 132/70 (BP Location: Left Arm, Patient Position: Sitting, Cuff Size: Normal)   Pulse 71   Temp 98.8 F (37.1 C) (Oral)   Ht 5\' 5"  (1.651 m)   Wt 186 lb (84.4 kg)   SpO2 97%   BMI 30.95 kg/m      Review of Systems  Constitutional: Positive for activity change, appetite change, chills and fatigue. Negative for diaphoresis and fever.  HENT: Positive for sore throat. Negative for congestion, dental problem, hearing loss, rhinorrhea, sinus pressure and tinnitus.   Eyes: Negative for pain, discharge and visual disturbance.  Respiratory: Positive for cough. Negative for shortness of breath.   Cardiovascular: Negative for chest pain, palpitations and leg swelling.  Gastrointestinal: Negative for abdominal distention, abdominal pain, blood in stool, constipation, diarrhea, nausea and vomiting.  Genitourinary: Negative for difficulty urinating, dysuria, flank pain, frequency, hematuria, pelvic pain, urgency, vaginal bleeding, vaginal discharge and vaginal pain.  Musculoskeletal: Positive for back pain. Negative for  arthralgias, gait problem and joint swelling.  Skin: Negative for rash.  Neurological: Negative for dizziness, syncope, speech difficulty, weakness, numbness and headaches.  Hematological: Negative for adenopathy.  Psychiatric/Behavioral: Negative for agitation, behavioral problems and dysphoric mood. The patient is not nervous/anxious.        Objective:   Physical Exam  Constitutional: She is oriented to person, place, and time. She appears well-developed and well-nourished. No distress.  febrile  HENT:  Head: Normocephalic.  Right Ear: External ear normal.  Left Ear: External ear normal.  Mouth/Throat: Oropharynx is clear and moist.  Eyes: Conjunctivae and EOM are normal. Pupils are equal, round, and reactive to light.  Neck: Normal range of motion. Neck supple. No thyromegaly present.  Cardiovascular: Normal rate, regular rhythm, normal heart sounds and intact distal pulses.   Pulmonary/Chest: Effort normal.  A few scattered coarse rhonchi No wheezing O2 saturation 97  Abdominal: Soft. Bowel sounds are normal. She exhibits no mass. There is no tenderness.  Musculoskeletal: Normal range of motion.  Lymphadenopathy:    She has no cervical adenopathy.  Neurological: She is alert and oriented to person, place, and time.  Skin: Skin is warm and dry. No rash noted.  Psychiatric: She has a normal mood and affect. Her behavior is normal.          Assessment & Plan:   Bronchitis Flu syndrome.  Will chew with a nebulizer treatment. PCR for influenza Chronic low back pain  Tamiflu.  If positive  Rogelia BogaKWIATKOWSKI,Dayja Loveridge FRANK

## 2016-10-25 NOTE — Patient Instructions (Addendum)
Acute bronchitis symptoms for less than 10 days are generally not helped by antibiotics.  Take over-the-counter expectorants and cough medications such as  Mucinex DM.  Call if there is no improvement in 5 to 7 days or if  you develop worsening cough, fever, or new symptoms, such as shortness of breath or chest pain.  Hydrate and Humidify  Drink enough water to keep your urine clear or pale yellow. Staying hydrated will help to thin your mucus.  Use a cool mist humidifier to keep the humidity level in your home above 50%.  Inhale steam for 10-15 minutes, 3-4 times a day or as told by your health care provider. You can do this in the bathroom while a hot shower is running.  Limit your exposure to cool or dry air. Rest  Rest as much as possible. All in all her chart will probably a week

## 2016-10-25 NOTE — Progress Notes (Signed)
Pre visit review using our clinic review tool, if applicable. No additional management support is needed unless otherwise documented below in the visit note. 

## 2016-10-27 ENCOUNTER — Ambulatory Visit: Payer: Medicare Other | Admitting: Internal Medicine

## 2016-10-28 ENCOUNTER — Ambulatory Visit: Payer: Medicare Other | Admitting: Cardiology

## 2016-10-31 ENCOUNTER — Encounter: Payer: Self-pay | Admitting: Cardiology

## 2016-10-31 ENCOUNTER — Ambulatory Visit (INDEPENDENT_AMBULATORY_CARE_PROVIDER_SITE_OTHER): Payer: Medicare Other | Admitting: Cardiology

## 2016-10-31 VITALS — BP 142/85 | HR 58 | Ht 65.5 in | Wt 184.8 lb

## 2016-10-31 DIAGNOSIS — R002 Palpitations: Secondary | ICD-10-CM

## 2016-10-31 DIAGNOSIS — R072 Precordial pain: Secondary | ICD-10-CM

## 2016-10-31 DIAGNOSIS — I10 Essential (primary) hypertension: Secondary | ICD-10-CM | POA: Diagnosis not present

## 2016-10-31 NOTE — Patient Instructions (Signed)
Your physician wants you to follow-up in: ONE YEAR WITH DR CRENSHAW You will receive a reminder letter in the mail two months in advance. If you don't receive a letter, please call our office to schedule the follow-up appointment.   If you need a refill on your cardiac medications before your next appointment, please call your pharmacy.  

## 2016-10-31 NOTE — Progress Notes (Signed)
HPI: FU palpitations. She has a long history of palpitations and presyncope. Patient apparently noted to have a PFO on transesophageal echocardiogram in 2006 following CVA. Echocardiogram in September 2011 showed normal LV function, grade 1 diastolic dysfunction, mildly dilated descending aorta and trace aortic insufficiency. Event monitor in 2011 for palpitations showed sinus with PACs. Nuclear study September 2016 showed apical thinning and mild ischemia in the inferior lateral wall. Cardiac catheterization September 2016 showed no obstructive coronary disease and normal LV function. Since last seen, patient has dyspnea that she attributes to bronchitis. She has occasional chest pain described as a squeezing sensation which is unchanged and long-standing. It is not exertional and is relieved with aspirin.  Current Outpatient Prescriptions  Medication Sig Dispense Refill  . atorvastatin (LIPITOR) 40 MG tablet TAKE 1 TABLET BY MOUTH EVERY DAY 90 tablet 1  . BIOTIN PO Take 1 tablet by mouth daily.    . butalbital-acetaminophen-caffeine (FIORICET, ESGIC) 50-325-40 MG tablet Take 1 tablet by mouth every 6 (six) hours as needed for headache. 60 tablet 1  . clopidogrel (PLAVIX) 75 MG tablet Take 1 tablet (75 mg total) by mouth every evening. 90 tablet 1  . diclofenac (VOLTAREN) 75 MG EC tablet Take 75 mg by mouth 2 (two) times daily.  1  . levothyroxine (SYNTHROID, LEVOTHROID) 125 MCG tablet TAKE 1 TABLET BY MOUTH DAILY BEFORE BREAKFAST. 90 tablet 0  . lisinopril-hydrochlorothiazide (PRINZIDE,ZESTORETIC) 20-12.5 MG tablet TAKE 1 TABLET BY MOUTH DAILY. 90 tablet 1  . tetrahydrozoline 0.05 % ophthalmic solution Place 2 drops into both eyes as needed (for red eyes). Reported on 02/12/2016    . traMADol (ULTRAM) 50 MG tablet Take 1 tablet (50 mg total) by mouth every 6 (six) hours as needed. 90 tablet 1  . verapamil (VERELAN PM) 240 MG 24 hr capsule TAKE 1 CAPSULE (240 MG TOTAL) BY MOUTH AT BEDTIME. 30  capsule 3   No current facility-administered medications for this visit.      Past Medical History:  Diagnosis Date  . Bruises easily   . DDD (degenerative disc disease)   . Degenerative joint disease of cervical spine 09-05-11   Cervival area, now some osteoarthritis-lower back and Rt. shoulder  . Depression   . Diverticulitis 09-05-11   hx. gastritis, diverticulitis x2 -now surgery planned  . DIVERTICULITIS, ACUTE 02/10/2010  . Essential hypertension 09-05-11   tx. Verapamil  . GROIN PAIN 09/21/2010  . Headache(784.0)    headaches are better  . Hearing loss   . Heart palpitations   . Hemorrhoids   . History of bronchitis   . History of colonic polyps   . History of pneumonia   . History of tension headache   . Hyperlipidemia   . Hypertension   . Hypothyroidism 09-05-11   Supplement used  . Late effect of adverse effect of drug, medicinal or biological substance   . NECK PAIN, ACUTE 09/21/2010  . Nocturia   . Osteoarthritis 112-17-12   spine and rt. hip, rt. shoulder  . Patent foramen ovale    Small - unable to be closed -   . PONV (postoperative nausea and vomiting)   . Sleep apnea 09-05-11   no cpap ever, had surgery to remove cartilage, no problems now  . SORE THROAT 04/30/2009  . Stroke (HCC) 09-05-11   2006/2009-(loss of memory, balance issues remains occ.)  . TIA 09/28/2007, 10/2014  . Unstable angina (HCC)    a. 05/2010 Cath: nl cors, EF 55%;  b. 05/2015 Lexiscan MV: small, severe, fixed apical defect and a small, severe, reversible inf lateral defect w/ apical thinning and mild ischemia, EF 54%.  Marland Kitchen URI 09/02/2008  . Vertigo     Past Surgical History:  Procedure Laterality Date  . ABDOMINAL HYSTERECTOMY    . CARDIAC CATHETERIZATION N/A 06/01/2015   Procedure: Left Heart Cath and Coronary Angiography;  Surgeon: Kathleene Hazel, MD;  Location: [facility] INVASIVE CV LAB;  Service: Cardiovascular;  Laterality: N/A;  . COLON RESECTION  09/07/2011   Procedure:  LAPAROSCOPIC SIGMOID COLON RESECTION;  Surgeon: Mariella Saa, MD;  Location: [facility] ORS;  Service: General;  Laterality: N/A;  with proctoscopy  . ELBOW SURGERY  09-05-11   left elbow -ligament repair  . EYE SURGERY     cataract  . INSERTION OF MESH N/A 12/13/2013   Procedure: INSERTION OF MESH;  Surgeon: Mariella Saa, MD;  Location: [facility];  Service: General;  Laterality: N/A;  . LAPAROSCOPY  09/07/2011   Procedure: LAPAROSCOPY DIAGNOSTIC;  Surgeon: Roselle Locus II;  Location: [facility];  Service: Gynecology;  Laterality: N/A;  . MAXIMUM ACCESS (MAS)POSTERIOR LUMBAR INTERBODY FUSION (PLIF) 2 LEVEL N/A 05/03/2016   Procedure: L4-5 L5-S1 Maximum access posterior lumbar interbody fusion;  Surgeon: Maeola Harman, MD;  Location: [facility] NEURO ORS;  Service: Neurosurgery;  Laterality: N/A;  L4-5 L5-S1 Maximum access posterior lumbar interbody fusion  . SALPINGOOPHORECTOMY  09/07/2011   Procedure: SALPINGO OOPHERECTOMY;  Surgeon: Roselle Locus II;  Location: [facility];  Service: Gynecology;  Laterality: Right;  . THYROIDECTOMY, PARTIAL  09-05-11  . TUBAL LIGATION  1983  . UMBILICAL HERNIA REPAIR  yrs ago  . VENTRAL HERNIA REPAIR  06/20/2012   Procedure: LAPAROSCOPIC VENTRAL HERNIA;  Surgeon: Mariella Saa, MD;  Location: [facility];  Service: General;  Laterality: N/A;  Laparoscopic Repair of Ventral Hernia with mesh  . VENTRAL HERNIA REPAIR N/A 12/13/2013   Procedure: LAPAROSCOPIC REPAIR RECURRENT VENTRAL INCISIONAL  HERNIA;  Surgeon: Mariella Saa, MD;  Location: [facility];  Service: General;  Laterality: N/A;    Social History   Social History  . Marital status: Married    Spouse name: N/A  . Number of children: N/A  . Years of education: N/A   Occupational History  . Not on file.   Social History Main Topics  . Smoking status: Former Smoker    Packs/day: 0.50    Years: 5.00    Types: Cigarettes    Quit date: 09/19/1968  . Smokeless tobacco: Never Used  . Alcohol use 1.2  oz/week    1 Cans of beer, 1 Shots of liquor per week     Comment: rarely  . Drug use: No  . Sexual activity: Yes   Other Topics Concern  . Not on file   Social History Narrative   Lives in [place] with her husband.  She does not routinely exercise.    Family History  Problem Relation Age of Onset  . Arrhythmia Mother   . Hypertension Mother   . Stroke Mother   . Stroke Maternal Grandmother   . Diabetes Paternal Grandmother     ROS: Recent bronchitis and abdominal pain from question cys tbut no melena, hematochezia, dysuria, hematuria, rash, seizure activity, orthopnea, PND, pedal edema, claudication. Remaining systems are negative.  Physical Exam: Well-developed well-nourished in no acute distress.  Skin is warm and dry.  HEENT is normal.  Neck is supple.  Chest mild exp wheeze Cardiovascular exam is  regular rate and rhythm.  Abdominal exam nontender or distended. No masses palpated. Extremities show no edema. neuro grossly intact  ECG-Sinus rhythm with occasional PVC. Left ventricular hypertrophy.  A/P  1 Preoperative evaluation prior to repair of abdominal surgery-previous catheterization revealed no coronary disease. She may proceed without further evaluation.  2 hypertension-blood pressure mildly elevated. Continue present medications and follow. Dose can be increased as needed.   3 palpitations-continue verapamil.  4 chest pain- symptoms are chronic and unchanged. Previous catheterization revealed no coronary disease. No further ischemia evaluation.   Abigail Millers, MD

## 2016-11-25 ENCOUNTER — Encounter (HOSPITAL_COMMUNITY)
Admission: RE | Admit: 2016-11-25 | Discharge: 2016-11-25 | Disposition: A | Discharge status: Home / Self Care | Payer: Medicare Other | Source: Ambulatory Visit | Attending: General Surgery | Admitting: General Surgery

## 2016-11-25 ENCOUNTER — Encounter (HOSPITAL_COMMUNITY): Payer: Self-pay

## 2016-11-25 ENCOUNTER — Telehealth: Payer: Self-pay | Admitting: Internal Medicine

## 2016-11-25 DIAGNOSIS — Z01812 Encounter for preprocedural laboratory examination: Secondary | ICD-10-CM | POA: Insufficient documentation

## 2016-11-25 DIAGNOSIS — Z01818 Encounter for other preprocedural examination: Secondary | ICD-10-CM | POA: Diagnosis present

## 2016-11-25 HISTORY — DX: Bronchitis, not specified as acute or chronic: J40

## 2016-11-25 HISTORY — DX: Pneumonia, unspecified organism: J18.9

## 2016-11-25 LAB — CBC
HCT: 37.5 % (ref 36.0–46.0)
Hemoglobin: 12.3 g/dL (ref 12.0–15.0)
MCH: 29.7 pg (ref 26.0–34.0)
MCHC: 32.8 g/dL (ref 30.0–36.0)
MCV: 90.6 fL (ref 78.0–100.0)
PLATELETS: 151 10*3/uL (ref 150–400)
RBC: 4.14 MIL/uL (ref 3.87–5.11)
RDW: 13.7 % (ref 11.5–15.5)
WBC: 5.2 10*3/uL (ref 4.0–10.5)

## 2016-11-25 LAB — BASIC METABOLIC PANEL
Anion gap: 5 (ref 5–15)
BUN: 14 mg/dL (ref 6–20)
CALCIUM: 9.5 mg/dL (ref 8.9–10.3)
CHLORIDE: 109 mmol/L (ref 101–111)
CO2: 26 mmol/L (ref 22–32)
CREATININE: 0.75 mg/dL (ref 0.44–1.00)
GFR calc non Af Amer: >60 mL/min (ref >60)
Glucose, Bld: 89 mg/dL (ref 65–99)
Potassium: 3.8 mmol/L (ref 3.5–5.1)
SODIUM: 140 mmol/L (ref 135–145)

## 2016-11-25 NOTE — Progress Notes (Signed)
EKG IS 09-30-16 NOT 10-31-16 EPIC

## 2016-11-25 NOTE — Telephone Encounter (Signed)
Please ask patient to continue to observe on present medical regimen and to notify if any worsening of blood pressure control

## 2016-11-25 NOTE — Patient Instructions (Addendum)
Abigail [name]  11/25/2016   Your procedure is scheduled on: 11-29-16  [facility]  [facility] to 3rd floor to  Short Stay Center at 530  AM.  Call this number if you have problems the morning of surgery [phone]   Remember: ONLY 1 PERSON MAY GO WITH YOU TO SHORT STAY TO GET  READY MORNING OF YOUR SURGERY.  Do not eat food or drink liquids :After Midnight.     Take these medicines the morning of surgery with A SIP OF WATER: LEVOTHYROXINE (SYNTHROID), EYE DROP IF NEEDED, Tramadol if needed.                               You may not have any metal on your body including hair pins and              piercings  Do not wear jewelry, make-up, lotions, powders or perfumes, deodorant             Do not wear nail polish.  Do not shave  48 hours prior to surgery.              Men may shave face and neck.   Do not bring valuables to the hospital. [facility] IS NOT             RESPONSIBLE   FOR VALUABLES.  Contacts, dentures or bridgework may not be worn into surgery.  Leave suitcase in the car. After surgery it may be brought to your room.     Patients discharged the day of surgery will not be allowed to drive home.  Name and phone number of your driver: Fayrene FearingJames (husband) [phone]               Please read over the following fact sheets you were given: _____________________________________________________________________             [facility] - Pre[facility] Before surgery, you can play an important role.  Because skin is not sterile, your skin needs to be as free of germs as possible.  You can reduce the number of germs on your skin by washing with CHG (chlorahexidine gluconate) soap before surgery.  CHG is an antiseptic cleaner which kills germs and bonds with the skin to continue killing germs even after washing. Please DO NOT use if you have an allergy to CHG or antibacterial soaps.  If your skin becomes  reddened/irritated stop using the CHG and inform your nurse when you arrive at Short Stay. Do not shave (including legs and underarms) for at least 48 hours prior to the first CHG shower.  You may shave your face/neck. Please follow these instructions carefully:  1.  Shower with CHG Soap the night before surgery and the  morning of Surgery.  2.  If you choose to wash your hair, wash your hair first as usual with your  normal  shampoo.  3.  After you shampoo, rinse your hair and body thoroughly to remove the  shampoo.                           4.  Use CHG as you would any other liquid soap.  You can apply chg directly  to the skin and wash  Gently with a scrungie or clean washcloth.  5.  Apply the CHG Soap to your body ONLY FROM THE NECK DOWN.   Do not use on face/ open                           Wound or open sores. Avoid contact with eyes, ears mouth and genitals (private parts).                       Wash face,  Genitals (private parts) with your normal soap.             6.  Wash thoroughly, paying special attention to the area where your surgery  will be performed.  7.  Thoroughly rinse your body with warm water from the neck down.  8.  DO NOT shower/wash with your normal soap after using and rinsing off  the CHG Soap.                9.  Pat yourself dry with a clean towel.            10.  Wear clean pajamas.            11.  Place clean sheets on your bed the night of your first shower and do not  sleep with pets. Day of Surgery : Do not apply any lotions/deodorants the morning of surgery.  Please wear clean clothes to the hospital/surgery center.  FAILURE TO FOLLOW THESE INSTRUCTIONS MAY RESULT IN THE CANCELLATION OF YOUR SURGERY PATIENT SIGNATURE_________________________________  NURSE SIGNATURE__________________________________  ________________________________________________________________________

## 2016-11-25 NOTE — Telephone Encounter (Signed)
See message below, please advise.

## 2016-11-25 NOTE — Progress Notes (Signed)
LOV CARDIO DR CRENSHAW 10-31-16 EPIC [facility] EP[facility] EKG 10-31-16 EPIC

## 2016-11-25 NOTE — Telephone Encounter (Signed)
Pt went in for pre-admission for surgery (that is scheduled for 3/13 at 730am) and her BP was 150/70 today.  It was 140/70 at her cardiologist last month.  Her concern is the BP is going up and she is wondering if there needs to be adjustments to her meds.

## 2016-11-25 NOTE — Pre-Procedure Instructions (Signed)
Pt states last Plavix dose was 11/23/16.  Instructed pt to call Dr. Dorathy KinsmanHosworth's office to ask if she needs to stop scheduled diclofenac.

## 2016-11-28 ENCOUNTER — Other Ambulatory Visit: Payer: Self-pay | Admitting: Internal Medicine

## 2016-11-29 ENCOUNTER — Ambulatory Visit (HOSPITAL_COMMUNITY)
Admission: RE | Admit: 2016-11-29 | Discharge: 2016-11-29 | Disposition: A | Discharge status: Home / Self Care | Payer: Medicare Other | Source: Ambulatory Visit | Attending: General Surgery | Admitting: General Surgery

## 2016-11-29 ENCOUNTER — Ambulatory Visit (HOSPITAL_COMMUNITY): Payer: Medicare Other | Admitting: Certified Registered Nurse Anesthetist

## 2016-11-29 ENCOUNTER — Encounter (HOSPITAL_COMMUNITY): Payer: Self-pay | Admitting: *Deleted

## 2016-11-29 ENCOUNTER — Encounter (HOSPITAL_COMMUNITY)
Admission: RE | Disposition: A | Discharge status: Home / Self Care | Payer: Self-pay | Source: Ambulatory Visit | Attending: General Surgery

## 2016-11-29 DIAGNOSIS — L0882 Omphalitis not of newborn: Secondary | ICD-10-CM | POA: Insufficient documentation

## 2016-11-29 DIAGNOSIS — Z8673 Personal history of transient ischemic attack (TIA), and cerebral infarction without residual deficits: Secondary | ICD-10-CM | POA: Diagnosis not present

## 2016-11-29 DIAGNOSIS — Z87891 Personal history of nicotine dependence: Secondary | ICD-10-CM | POA: Insufficient documentation

## 2016-11-29 DIAGNOSIS — Q211 Atrial septal defect: Secondary | ICD-10-CM | POA: Diagnosis not present

## 2016-11-29 DIAGNOSIS — Z9049 Acquired absence of other specified parts of digestive tract: Secondary | ICD-10-CM | POA: Insufficient documentation

## 2016-11-29 DIAGNOSIS — E7800 Pure hypercholesterolemia, unspecified: Secondary | ICD-10-CM | POA: Diagnosis not present

## 2016-11-29 DIAGNOSIS — E039 Hypothyroidism, unspecified: Secondary | ICD-10-CM | POA: Diagnosis not present

## 2016-11-29 DIAGNOSIS — Z79899 Other long term (current) drug therapy: Secondary | ICD-10-CM | POA: Diagnosis not present

## 2016-11-29 DIAGNOSIS — R1905 Periumbilic swelling, mass or lump: Secondary | ICD-10-CM | POA: Diagnosis present

## 2016-11-29 DIAGNOSIS — I10 Essential (primary) hypertension: Secondary | ICD-10-CM | POA: Diagnosis not present

## 2016-11-29 DIAGNOSIS — G4730 Sleep apnea, unspecified: Secondary | ICD-10-CM | POA: Diagnosis not present

## 2016-11-29 DIAGNOSIS — K420 Umbilical hernia with obstruction, without gangrene: Secondary | ICD-10-CM | POA: Insufficient documentation

## 2016-11-29 DIAGNOSIS — I2510 Atherosclerotic heart disease of native coronary artery without angina pectoris: Secondary | ICD-10-CM | POA: Diagnosis not present

## 2016-11-29 DIAGNOSIS — Z6830 Body mass index (BMI) 30.0-30.9, adult: Secondary | ICD-10-CM | POA: Diagnosis not present

## 2016-11-29 HISTORY — PX: CYST EXCISION: SHX5701

## 2016-11-29 SURGERY — CYST REMOVAL
Anesthesia: General | Site: Abdomen

## 2016-11-29 MED ORDER — RACEPINEPHRINE HCL 2.25 % IN NEBU
INHALATION_SOLUTION | RESPIRATORY_TRACT | Status: AC
  Filled 2016-11-29: qty 0.5

## 2016-11-29 MED ORDER — SCOPOLAMINE 1 MG/3DAYS TD PT72
1.0000 | MEDICATED_PATCH | TRANSDERMAL | Status: DC
  Administered 2016-11-29: 1 via TRANSDERMAL

## 2016-11-29 MED ORDER — MORPHINE SULFATE 15 MG PO TABS: 15.0000 mg | ORAL_TABLET | ORAL | 0 refills | Status: DC | PRN

## 2016-11-29 MED ORDER — DEXAMETHASONE SODIUM PHOSPHATE 10 MG/ML IJ SOLN
10.0000 mg | Freq: Once | INTRAMUSCULAR | Status: AC
  Administered 2016-11-29: 10 mg via INTRAVENOUS

## 2016-11-29 MED ORDER — DEXAMETHASONE SODIUM PHOSPHATE 10 MG/ML IJ SOLN
INTRAMUSCULAR | Status: AC
  Filled 2016-11-29: qty 1

## 2016-11-29 MED ORDER — BUPIVACAINE-EPINEPHRINE 0.25% -1:200000 IJ SOLN
INTRAMUSCULAR | Status: AC
  Filled 2016-11-29: qty 1

## 2016-11-29 MED ORDER — EPHEDRINE SULFATE 50 MG/ML IJ SOLN
INTRAMUSCULAR | Status: DC | PRN
  Administered 2016-11-29 (×3): 10 mg via INTRAVENOUS

## 2016-11-29 MED ORDER — MENTHOL 3 MG MT LOZG
1.0000 | LOZENGE | OROMUCOSAL | Status: DC | PRN
  Filled 2016-11-29: qty 9

## 2016-11-29 MED ORDER — PROPOFOL 10 MG/ML IV BOLUS
INTRAVENOUS | Status: AC
  Filled 2016-11-29: qty 20

## 2016-11-29 MED ORDER — FENTANYL CITRATE (PF) 100 MCG/2ML IJ SOLN
INTRAMUSCULAR | Status: AC
  Filled 2016-11-29: qty 2

## 2016-11-29 MED ORDER — DEXAMETHASONE SODIUM PHOSPHATE 10 MG/ML IJ SOLN
INTRAMUSCULAR | Status: DC | PRN
  Administered 2016-11-29: 10 mg via INTRAVENOUS

## 2016-11-29 MED ORDER — LACTATED RINGERS IV SOLN
INTRAVENOUS | Status: DC
  Administered 2016-11-29: 07:00:00 via INTRAVENOUS

## 2016-11-29 MED ORDER — CIPROFLOXACIN IN D5W 400 MG/200ML IV SOLN
INTRAVENOUS | Status: AC
  Filled 2016-11-29: qty 200

## 2016-11-29 MED ORDER — 0.9 % SODIUM CHLORIDE (POUR BTL) OPTIME
TOPICAL | Status: DC | PRN
  Administered 2016-11-29: 1000 mL

## 2016-11-29 MED ORDER — MIDAZOLAM HCL 2 MG/2ML IJ SOLN
INTRAMUSCULAR | Status: AC
  Filled 2016-11-29: qty 2

## 2016-11-29 MED ORDER — FENTANYL CITRATE (PF) 100 MCG/2ML IJ SOLN
25.0000 ug | INTRAMUSCULAR | Status: DC | PRN
  Administered 2016-11-29 (×3): 50 ug via INTRAVENOUS

## 2016-11-29 MED ORDER — SUGAMMADEX SODIUM 200 MG/2ML IV SOLN
INTRAVENOUS | Status: AC
  Filled 2016-11-29: qty 2

## 2016-11-29 MED ORDER — SCOPOLAMINE 1 MG/3DAYS TD PT72
MEDICATED_PATCH | TRANSDERMAL | Status: AC
  Filled 2016-11-29: qty 1

## 2016-11-29 MED ORDER — CHLORHEXIDINE GLUCONATE CLOTH 2 % EX PADS: 6.0000 | MEDICATED_PAD | Freq: Once | CUTANEOUS | Status: DC

## 2016-11-29 MED ORDER — RACEPINEPHRINE HCL 2.25 % IN NEBU
0.5000 mL | INHALATION_SOLUTION | Freq: Once | RESPIRATORY_TRACT | Status: AC
  Administered 2016-11-29: 0.5 mL via RESPIRATORY_TRACT

## 2016-11-29 MED ORDER — PROMETHAZINE HCL 25 MG/ML IJ SOLN: 6.2500 mg | INTRAMUSCULAR | Status: DC | PRN

## 2016-11-29 MED ORDER — MORPHINE SULFATE 15 MG PO TABS
7.5000 mg | ORAL_TABLET | Freq: Once | ORAL | Status: AC
  Administered 2016-11-29: 7.5 mg via ORAL
  Filled 2016-11-29: qty 1

## 2016-11-29 MED ORDER — PROPOFOL 10 MG/ML IV BOLUS
INTRAVENOUS | Status: DC | PRN
  Administered 2016-11-29: 150 mg via INTRAVENOUS

## 2016-11-29 MED ORDER — FENTANYL CITRATE (PF) 100 MCG/2ML IJ SOLN
INTRAMUSCULAR | Status: DC | PRN
  Administered 2016-11-29 (×2): 50 ug via INTRAVENOUS

## 2016-11-29 MED ORDER — OXYMETAZOLINE HCL 0.05 % NA SOLN
NASAL | Status: AC
  Filled 2016-11-29: qty 15

## 2016-11-29 MED ORDER — LIDOCAINE 2% (20 MG/ML) 5 ML SYRINGE
INTRAMUSCULAR | Status: DC | PRN
  Administered 2016-11-29: 80 mg via INTRAVENOUS

## 2016-11-29 MED ORDER — MIDAZOLAM HCL 2 MG/2ML IJ SOLN
0.5000 mg | Freq: Once | INTRAMUSCULAR | Status: AC | PRN
  Administered 2016-11-29: 0.5 mg via INTRAVENOUS

## 2016-11-29 MED ORDER — ROCURONIUM BROMIDE 50 MG/5ML IV SOSY
PREFILLED_SYRINGE | INTRAVENOUS | Status: DC | PRN
  Administered 2016-11-29: 40 mg via INTRAVENOUS
  Administered 2016-11-29: 20 mg via INTRAVENOUS
  Administered 2016-11-29: 10 mg via INTRAVENOUS

## 2016-11-29 MED ORDER — MIDAZOLAM HCL 2 MG/2ML IJ SOLN
INTRAMUSCULAR | Status: AC
  Administered 2016-11-29: 0.5 mg
  Filled 2016-11-29: qty 2

## 2016-11-29 MED ORDER — ONDANSETRON HCL 4 MG/2ML IJ SOLN
INTRAMUSCULAR | Status: DC | PRN
  Administered 2016-11-29: 4 mg via INTRAVENOUS

## 2016-11-29 MED ORDER — OXYMETAZOLINE HCL 0.05 % NA SOLN
1.0000 | Freq: Two times a day (BID) | NASAL | Status: DC
  Administered 2016-11-29 (×3): 2 via NASAL

## 2016-11-29 MED ORDER — SUGAMMADEX SODIUM 200 MG/2ML IV SOLN
INTRAVENOUS | Status: DC | PRN
  Administered 2016-11-29: 170 mg via INTRAVENOUS

## 2016-11-29 MED ORDER — ALBUTEROL SULFATE (2.5 MG/3ML) 0.083% IN NEBU
INHALATION_SOLUTION | RESPIRATORY_TRACT | Status: AC
  Filled 2016-11-29: qty 3

## 2016-11-29 MED ORDER — ONDANSETRON HCL 4 MG/2ML IJ SOLN
INTRAMUSCULAR | Status: AC
  Filled 2016-11-29: qty 2

## 2016-11-29 MED ORDER — BUPIVACAINE-EPINEPHRINE 0.5% -1:200000 IJ SOLN
INTRAMUSCULAR | Status: DC | PRN
  Administered 2016-11-29: 20 mL

## 2016-11-29 MED ORDER — CIPROFLOXACIN IN D5W 400 MG/200ML IV SOLN
400.0000 mg | INTRAVENOUS | Status: AC
  Administered 2016-11-29: 400 mg via INTRAVENOUS

## 2016-11-29 MED ORDER — ROCURONIUM BROMIDE 50 MG/5ML IV SOSY
PREFILLED_SYRINGE | INTRAVENOUS | Status: AC
  Filled 2016-11-29: qty 10

## 2016-11-29 MED ORDER — LIDOCAINE 2% (20 MG/ML) 5 ML SYRINGE
INTRAMUSCULAR | Status: AC
  Filled 2016-11-29: qty 5

## 2016-11-29 MED ORDER — MIDAZOLAM HCL 5 MG/5ML IJ SOLN
INTRAMUSCULAR | Status: DC | PRN
Start: 2016-11-29 — End: 2016-11-29
  Administered 2016-11-29 (×2): 0.5 mg via INTRAVENOUS

## 2016-11-29 SURGICAL SUPPLY — 44 items
ADH SKN CLS APL DERMABOND .7 (GAUZE/BANDAGES/DRESSINGS) ×1
APL SKNCLS STERI-STRIP NONHPOA (GAUZE/BANDAGES/DRESSINGS)
BENZOIN TINCTURE PRP APPL 2/3 (GAUZE/BANDAGES/DRESSINGS) IMPLANT
BLADE HEX COATED 2.75 (ELECTRODE) ×1 IMPLANT
BLADE SURG 15 STRL LF DISP TIS (BLADE) ×1 IMPLANT
BLADE SURG 15 STRL SS (BLADE) ×3
BLADE SURG SZ10 CARB STEEL (BLADE) ×1 IMPLANT
CLOSURE WOUND 1/2 X4 (GAUZE/BANDAGES/DRESSINGS)
COVER SURGICAL LIGHT HANDLE (MISCELLANEOUS) ×3 IMPLANT
DECANTER SPIKE VIAL GLASS SM (MISCELLANEOUS) ×2 IMPLANT
DERMABOND ADVANCED (GAUZE/BANDAGES/DRESSINGS) ×2
DERMABOND ADVANCED .7 DNX12 (GAUZE/BANDAGES/DRESSINGS) IMPLANT
DRAPE INCISE IOBAN 66X45 STRL (DRAPES) ×1 IMPLANT
DRAPE LAPAROTOMY T 102X78X121 (DRAPES) ×2 IMPLANT
DRAPE LAPAROTOMY TRNSV 102X78 (DRAPE) IMPLANT
DRAPE SHEET LG 3/4 BI-LAMINATE (DRAPES) IMPLANT
DRSG PAD ABDOMINAL 8X10 ST (GAUZE/BANDAGES/DRESSINGS) IMPLANT
ELECT COATED BLADE 2.86 ST (ELECTRODE) ×2 IMPLANT
ELECT PENCIL ROCKER SW 15FT (MISCELLANEOUS) ×3 IMPLANT
ELECT REM PT RETURN 9FT ADLT (ELECTROSURGICAL) ×3
ELECTRODE REM PT RTRN 9FT ADLT (ELECTROSURGICAL) ×1 IMPLANT
EVACUATOR SILICONE 100CC (DRAIN) IMPLANT
GAUZE SPONGE 4X4 12PLY STRL (GAUZE/BANDAGES/DRESSINGS) ×1 IMPLANT
GLOVE BIOGEL PI IND STRL 7.0 (GLOVE) ×1 IMPLANT
GLOVE BIOGEL PI INDICATOR 7.0 (GLOVE) ×2
GOWN STRL REUS W/TWL LRG LVL3 (GOWN DISPOSABLE) ×3 IMPLANT
GOWN STRL REUS W/TWL XL LVL3 (GOWN DISPOSABLE) ×4 IMPLANT
KIT BASIN OR (CUSTOM PROCEDURE TRAY) ×3 IMPLANT
MARKER SKIN DUAL TIP RULER LAB (MISCELLANEOUS) IMPLANT
NDL HYPO 25X1 1.5 SAFETY (NEEDLE) ×1 IMPLANT
NEEDLE HYPO 25X1 1.5 SAFETY (NEEDLE) ×3 IMPLANT
NS IRRIG 1000ML POUR BTL (IV SOLUTION) ×3 IMPLANT
PACK BASIC VI WITH GOWN DISP (CUSTOM PROCEDURE TRAY) ×3 IMPLANT
SOL PREP POV-IOD 4OZ 10% (MISCELLANEOUS) ×1 IMPLANT
SPONGE LAP 18X18 X RAY DECT (DISPOSABLE) IMPLANT
SPONGE LAP 4X18 X RAY DECT (DISPOSABLE) ×3 IMPLANT
STAPLER VISISTAT 35W (STAPLE) IMPLANT
STRIP CLOSURE SKIN 1/2X4 (GAUZE/BANDAGES/DRESSINGS) IMPLANT
SUT PDS AB 0 CT1 36 (SUTURE) ×2 IMPLANT
SUT VIC AB 3-0 SH 18 (SUTURE) IMPLANT
SYR BULB IRRIGATION 50ML (SYRINGE) ×2 IMPLANT
SYR CONTROL 10ML LL (SYRINGE) ×3 IMPLANT
TOWEL OR 17X26 10 PK STRL BLUE (TOWEL DISPOSABLE) ×3 IMPLANT
YANKAUER SUCT BULB TIP 10FT TU (MISCELLANEOUS) ×2 IMPLANT

## 2016-11-29 NOTE — Telephone Encounter (Signed)
Pt was called an informed

## 2016-11-29 NOTE — Discharge Instructions (Signed)
[facility]      [facility] [phone]  OPEN ABDOMINAL SURGERY: POST OP INSTRUCTIONS  Always review your discharge instruction sheet given to you by the facility where your surgery was performed.  IF YOU HAVE DISABILITY OR FAMILY LEAVE FORMS, YOU MUST BRING THEM TO THE OFFICE FOR PROCESSING.  PLEASE DO NOT GIVE THEM TO YOUR DOCTOR.  1. A prescription for pain medication may be given to you upon discharge.  Take your pain medication as prescribed, if needed.  If narcotic pain medicine is not needed, then you may take acetaminophen (Tylenol) or ibuprofen (Advil) as needed. 2. Take your usually prescribed medications unless otherwise directed. 3. If you need a refill on your pain medication, please contact your pharmacy. They will contact our office to request authorization.  Prescriptions will not be filled after 5pm or on week-ends. 4. You should follow a light diet the first few days after arrival home, such as soup and crackers, pudding, etc.unless your doctor has advised otherwise. A high-fiber, low fat diet can be resumed as tolerated.   Be sure to include lots of fluids daily. Most patients will experience some swelling and bruising on the chest and neck area.  Ice packs will help.  Swelling and bruising can take several days to resolve 5. Most patients will experience some swelling and bruising in the area of the incision. Ice pack will help. Swelling and bruising can take several days to resolve..  6. It is common to experience some constipation if taking pain medication after surgery.  Increasing fluid intake and taking a stool softener will usually help or prevent this problem from occurring.  A mild laxative (Milk of Magnesia or Miralax) should be taken according to package directions if there are no bowel movements after 48 hours. 7.  You may have steri-strips (small skin tapes) in place directly over the incision.  These strips should be left on the skin for 7-10 days.  If your  surgeon used skin glue on the incision, you may shower in 24 hours.  The glue will flake off over the next 2-3 weeks.  Any sutures or staples will be removed at the office during your follow-up visit. You may find that a light gauze bandage over your incision may keep your staples from being rubbed or pulled. You may shower and replace the bandage daily. 8. ACTIVITIES:  You may resume regular (light) daily activities beginning the next day--such as daily self-care, walking, climbing stairs--gradually increasing activities as tolerated.  You may have sexual intercourse when it is comfortable.  Refrain from any heavy lifting or straining until approved by your doctor. a. You may drive when you no longer are taking prescription pain medication, you can comfortably wear a seatbelt, and you can safely maneuver your car and apply brakes b. Return to Work: ___________________________________ 9. You should see your doctor in the office for a follow-up appointment approximately two weeks after your surgery.  Make sure that you call for this appointment within a day or two after you arrive home to insure a convenient appointment time. OTHER INSTRUCTIONS:  _____________________________________________________________ _____________________________________________________________  WHEN TO CALL YOUR DOCTOR: 1. Fever over 101.0 2. Inability to urinate 3. Nausea and/or vomiting 4. Extreme swelling or bruising 5. Continued bleeding from incision. 6. Increased pain, redness, or drainage from the incision. 7. Difficulty swallowing or breathing 8. Muscle cramping or spasms. 9. Numbness or tingling in hands or feet or around lips.  The clinic staff is available to   answer your questions during regular business hours.  Please don't hesitate to call and ask to speak to one of the nurses if you have concerns.  For further questions, please visit [email or web address]   

## 2016-11-29 NOTE — Transfer of Care (Signed)
Immediate Anesthesia Transfer of Care Note  Patient: Abigail [name]  Procedure(s) Performed: Procedure(s): EXCISION UMBILICAL CYST (N/A)  Patient Location: PACU  Anesthesia Type:General  Level of Consciousness:  sedated, patient cooperative and responds to stimulation  Airway & Oxygen Therapy:Patient Spontanous Breathing and Patient connected to face mask oxgen  Post-op Assessment:  Report given to PACU RN and Post -op Vital signs reviewed and stable  Post vital signs:  Reviewed and stable  Last Vitals:  Vitals:   11/29/16 0545  BP: 131/68  Pulse: 63  Resp: 16  Temp: 36.6 C    Complications: No apparent anesthesia complications

## 2016-11-29 NOTE — Anesthesia Postprocedure Evaluation (Signed)
Anesthesia Post Note  Patient: Abigail [name]  Procedure(s) Performed: Procedure(s) (LRB): EXCISION UMBILICAL CYST (N/A)  Patient location during evaluation: PACU Anesthesia Type: General Level of consciousness: awake and alert, oriented and patient cooperative Pain management: pain level controlled Vital Signs Assessment: post-procedure vital signs reviewed and stable Respiratory status: spontaneous breathing, nonlabored ventilation and respiratory function stable Cardiovascular status: blood pressure returned to baseline and stable Postop Assessment: no signs of nausea or vomiting Anesthetic complications: yes Anesthetic complication details: injury of skin or soft tissue and respiratory eventComments: Pt with paroxysmal Stridor for about an hour in PACU, )2 sat 100% throughout, VSS.  Stridor relieved with humidified oxygen, racemic epi, decadron, afrin.  Pt has been observed, without recurrence over past 4 hours.  Dr. Johna SheriffHoxworth agrees pt stable for discharge.  She understands to call 911 at the first sign of any difficulty breathing.         Last Vitals:  Vitals:   11/29/16 1215 11/29/16 1300  BP:    Pulse:    Resp:    Temp: 36.7 C 36.7 C    Last Pain:  Vitals:   11/29/16 1115  TempSrc:   PainSc: 1                  Rovena Hearld,E. Jeryl Umholtz

## 2016-11-29 NOTE — Anesthesia Procedure Notes (Signed)
Procedure Name: Intubation Date/Time: 11/29/2016 7:42 AM Performed by: West Pugh Pre-anesthesia Checklist: Patient identified, Emergency Drugs available, Suction available, Patient being monitored and Timeout performed Patient Re-evaluated:Patient Re-evaluated prior to inductionOxygen Delivery Method: Circle system utilized Preoxygenation: Pre-oxygenation with 100% oxygen Intubation Type: IV induction Ventilation: Mask ventilation without difficulty Laryngoscope Size: Mac and 4 Grade View: Grade II Tube type: Oral Number of attempts: 1 Airway Equipment and Method: Stylet Placement Confirmation: ETT inserted through vocal cords under direct vision,  positive ETCO2,  CO2 detector and breath sounds checked- equal and bilateral Secured at: 22 cm Tube secured with: Tape Dental Injury: Teeth and Oropharynx as per pre-operative assessment

## 2016-11-29 NOTE — Interval H&P Note (Signed)
History and Physical Interval Note:  11/29/2016 7:26 AM  Abigail [name]  has presented today for surgery, with the diagnosis of umbilical cyst  The various methods of treatment have been discussed with the patient and family. After consideration of risks, benefits and other options for treatment, the patient has consented to  Procedure(s): EXCISION UMBILICAL CYST (N/A) as a surgical intervention .  The patient's history has been reviewed, patient examined, no change in status, stable for surgery.  I have reviewed the patient's chart and labs.  Questions were answered to the patient's satisfaction.     Ludean Duhart T

## 2016-11-29 NOTE — Op Note (Signed)
Preoperative Diagnosis: umbilical cyst  Postoprative Diagnosis: umbilical cyst  Procedure: Procedure(s): EXCISION UMBILICAL CYST   Surgeon: Glenna FellowsHoxworth, Brodee Mauritz T   Assistants: None  Anesthesia:  General LMA anesthesia  Indications: Patient presents with persistent purulent and bloody drainage from the base of her umbilicus where there is irritation and a skin opening and what appears to be a verrucous skin lesion on exam. She has a previous periumbilical incision from colectomy. This has failed to improve with conservative management and I have recommended excision of the base of her umbilicus for a persistent cyst and sinus. We discussed the nature of the surgery and indications and risks detailed elsewhere and she agrees to proceed.    Procedure Detail:  Patient was brought to the operating room, placed in supine position on the operating table, and laryngeal mask general anesthesia induced.  She received preoperative IV antibiotics. The abdomen was widely sterilely prepped and draped. Patient timeout was performed and correct procedure verified. I used the previous vertical circumareolar incision and extended this just slightly below the umbilicus. Dissection was carried down into the deep subcutaneous tissue along side of the umbilicus down to the level of the midline fascia. At this point the umbilicus was sharply dissected up off of the midline fascia. I did not see any sort of communication from the umbilicus down through the level of the fascia. There was a very tiny umbilical hernia that contained preperitoneal fat. No evidence of urachal cyst or deep stitch abscess or anything else coming from the level of the fascia. At this point the umbilical skin could be completely everted and there was a verrucous lesion and just adjacent to this a purplish cystic lesion at the base of the umbilicus consistent with possibly sebaceous cyst. I then sharply excised completely the skin at the base of the  umbilicus and the subcutaneous tissue full-thickness and extended this out to the side of the midline incision to avoid a narrow rim of skin at that side. This was sent for permanent pathology. The wound was thoroughly irrigated. The tiny fascial defect was closed with a couple of inverting 0 PDS sutures. The umbilical skin was tacked back down to the deep subcutaneous with interrupted 4-0 Monocryl. The skin incision was closed with a running subcuticular 4-0 Monocryl and Dermabond. Sponge needle and instrument counts were correct    Findings: As above  Estimated Blood Loss:  Minimal         Drains: None  Blood Given: none          Specimens: Umbilical skin and subcutaneous tissue        Complications:  * No complications entered in OR log *         Disposition: PACU - hemodynamically stable.         Condition: stable

## 2016-11-29 NOTE — H&P (Signed)
History of Present Illness Abigail [name](Abigail Smullen T. Abigail [name]; The patient is a 71 year old female who presents with an unspecified infection. Patient is well known to me with history of previous abdominal operations including laparoscopic tubal ligation, hysterectomy and laparoscopic colectomy. She has had 2 laparoscopic hernia repairs the last in 2015, the first being an umbilical hernia repair.. She comes into the office today due to recurrent drainage from her umbilicus. This is happened intermittently several times in the last 2 years. She now however is having continuous small amount of very foul-smelling drainage from her umbilicus gets on her close. No GI symptoms.   Problem List/Past Medical Abigail [name](Abigail Mottern T. Abigail Daniel, [name];  UMBILICAL CYST (Q64.4)   Past Surgical History Abigail [name](Abigail Vinal T. Abigail Ferdinand, [name];  Hysterectomy (not due to cancer) - Partial  Open Inguinal Hernia Surgery  Bilateral. Thyroid Surgery   Diagnostic Studies History Abigail [name](Abigail Jablon T. Abigail Hope, [name];  Colonoscopy  1-5 years ago Mammogram  1-3 years ago Pap Smear  1-5 years ago  Allergies Dilaudid *ANALGESICS - OPIOID*  Aggrenox *HEMATOLOGICAL AGENTS - MISC.*  Vomiting, Nausea. Cephalexin *CEPHALOSPORINS*  Itching, Rash. Codeine Phosphate *ANALGESICS - OPIOID*  Vomiting, Nausea. Contrast Allergy PreMed Pack *CORTICOSTEROIDS*  contrast media dye HYDROcodone Bitartrate *CHEMICALS*  Vomiting, Nausea. Meperidine HCl-Sodium Chloride *ANALGESICS - OPIOID*  Vomiting, Nausea. Percocet *ANALGESICS - OPIOID*  Vomiting, Nausea.  Medication History  Atorvastatin Calcium (40MG  Tablet, Oral) Active. Butalbital-APAP-Caffeine (50-325-40MG  Tablet, Oral) Active. Clopidogrel Bisulfate (75MG  Tablet, Oral) Active. Levothyroxine Sodium (125MCG Tablet, Oral) Active. Lisinopril-Hydrochlorothiazide (20-12.5MG  Tablet, Oral) Active. Verapamil HCl ER (240MG  Capsule ER 24HR, Oral) Active. TraMADol HCl (50MG  Tablet, Oral) Active. Biotin  (10MG  Capsule, Oral) Active. Medications Reconciled  Social History Alcohol use  Occasional alcohol use. No drug use  Tobacco use  Former smoker.  Family History Arthritis  Mother. Bleeding disorder  Mother. Breast Cancer  Sister. Cerebrovascular Accident  Mother. Heart Disease  Mother. Hypertension  Mother. Ischemic Bowel Disease  Mother. Prostate Cancer  Father. Thyroid problems  Mother.  Pregnancy / Birth History  Age at menarche  10 years. Age of menopause  5846-50 Gravida  3 Maternal age  72-25 Para  3 Regular periods   Other Problems Back Pain  Hemorrhoids  High blood pressure  Hypercholesterolemia   Vitals  Weight: 186.13 lb Height: 65.5in Body Surface Area: 1.93 m Body Mass Index: 30.5 kg/m  Temp.: 98.13F(Oral)  Pulse: 58 (Regular)  BP: 122/74 (Sitting, Left Arm, Standard)       Physical Exam  The physical exam findings are as follows: Note:General: Alert, well-developed and well nourished African-American female, in no distress Skin: Warm and dry without rash or infection. See umbilicus HEENT: No palpable masses or thyromegaly. Sclera nonicteric. Pupils equal round and reactive. Lymph nodes: No cervical, supraclavicular, or inguinal nodes palpable. Lungs: Breath sounds clear and equal. No wheezing or increased work of breathing. Cardiovascular: Regular rate and rhythm without murmer. No JVD or edema. Peripheral pulses intact. Abdomen: Nondistended. Soft and nontender. No masses palpable. No organomegaly. No palpable hernias. At the base of the umbilicus is an area of granulation tissue with a small amount of foul-smelling purulent drainage. There is an adjacent verrucous skin lesion that does not appear infected at the base of the umbilicus. No palpable hernia. Extremities: No edema or joint swelling or deformity. No chronic venous stasis changes. Somewhat stiffly due to recent back surgery Neurologic: Alert and  fully oriented. Gait normal. No focal weakness. Psychiatric: Normal mood and affect. Thought content appropriate with normal judgement and  insight    Assessment & Plan  UMBILICAL CYST (Q64.4) Impression: Chronic infection and drainage at the base of the umbilicus. There are several possibilities including sebaceous cyst, urachal cyst or conceivably infected suture material or mesh from her previous hernia repair. Symptoms are constant and not really tolerable for her. I recommended proceeding with exploration with excision of the base of the umbilicus and any underlying infected tissue. I discussed the nature of the surgery and indications with the patient and her husband. I discussed risks of anesthetic complications or heart or lung complications, bleeding, infection, recurrent infection or hernia. We will obtain cardiac clearance. She understands and desires to proceed. Current Plans Excision of umbilical cyst under general anesthesia as an outpatient

## 2016-11-29 NOTE — Anesthesia Preprocedure Evaluation (Signed)
Anesthesia Evaluation  Patient identified by MRN, date of birth, ID band Patient awake    Reviewed: Allergy & Precautions, NPO status , Patient's Chart, lab work & pertinent test results  History of Anesthesia Complications (+) PONV  Airway Mallampati: II  TM Distance: >3 FB Neck ROM: Full    Dental  (+) Chipped, Dental Advisory Given, Missing   Pulmonary sleep apnea (does not require CPAP) , former smoker,    breath sounds clear to auscultation       Cardiovascular hypertension, Pt. on medications (-) angina Rhythm:Regular Rate:Normal  '06 ECHO: EF 55-60%, valves OK, small patent foramen ovale with spontaneous right to left shunt and the shunting was pronounced with Valsalva. '16 Lexiscan MV: small, severe, fixed apical defect and a small, severe, reversible inf lateral defect with mild ischemia, EF 54% '16 cath: Mild non-obstructive CAD, Normal LV systolic function      Neuro/Psych Anxiety Depression CVA, No Residual Symptoms    GI/Hepatic negative GI ROS, Neg liver ROS,   Endo/Other  Hypothyroidism Morbid obesity  Renal/GU negative Renal ROS     Musculoskeletal  (+) Arthritis ,   Abdominal (+) + obese,   Peds  Hematology   Anesthesia Other Findings   Reproductive/Obstetrics                             Anesthesia Physical Anesthesia Plan  ASA: III  Anesthesia Plan: General   Post-op Pain Management:    Induction: Intravenous  Airway Management Planned: Oral ETT  Additional Equipment:   Intra-op Plan:   Post-operative Plan: Extubation in OR  Informed Consent: I have reviewed the patients History and Physical, chart, labs and discussed the procedure including the risks, benefits and alternatives for the proposed anesthesia with the patient or authorized representative who has indicated his/her understanding and acceptance.   Dental advisory given  Plan Discussed with:  CRNA and Surgeon  Anesthesia Plan Comments: (Plan routine monitors, GETA)        Anesthesia Quick Evaluation

## 2016-12-19 ENCOUNTER — Other Ambulatory Visit: Payer: Self-pay | Admitting: Internal Medicine

## 2017-01-02 ENCOUNTER — Other Ambulatory Visit: Payer: Self-pay | Admitting: Cardiology

## 2017-01-02 ENCOUNTER — Other Ambulatory Visit: Payer: Self-pay

## 2017-01-02 DIAGNOSIS — R072 Precordial pain: Secondary | ICD-10-CM

## 2017-01-02 MED ORDER — VERAPAMIL HCL ER 240 MG PO CP24: 240.0000 mg | ORAL_CAPSULE | Freq: Every day | ORAL | Status: DC

## 2017-01-02 MED ORDER — VERAPAMIL HCL ER 240 MG PO CP24: 240.0000 mg | ORAL_CAPSULE | Freq: Every day | ORAL | 3 refills | Status: DC

## 2017-01-02 NOTE — Telephone Encounter (Signed)
Rx(s) sent to pharmacy electronically.  

## 2017-01-02 NOTE — Addendum Note (Signed)
Addended by: Neta Ehlers on: 01/02/2017 05:31 PM   Modules accepted: Orders

## 2017-01-06 ENCOUNTER — Other Ambulatory Visit: Payer: Self-pay | Admitting: Internal Medicine

## 2017-01-08 ENCOUNTER — Other Ambulatory Visit: Payer: Self-pay | Admitting: Internal Medicine

## 2017-01-31 NOTE — Telephone Encounter (Signed)
Left message - told her to call us back to reschedule AWV from 1/18.

## 2017-02-06 ENCOUNTER — Other Ambulatory Visit: Payer: Self-pay | Admitting: Internal Medicine

## 2017-02-15 ENCOUNTER — Other Ambulatory Visit: Payer: Self-pay | Admitting: Internal Medicine

## 2017-02-20 ENCOUNTER — Other Ambulatory Visit: Payer: Self-pay | Admitting: Internal Medicine

## 2017-02-23 ENCOUNTER — Inpatient Hospital Stay (HOSPITAL_COMMUNITY)
Admission: EM | Admit: 2017-02-23 | Discharge: 2017-02-24 | DRG: 065 | Disposition: A | Discharge status: Home / Self Care | Payer: Medicare Other | Attending: Family Medicine | Admitting: Family Medicine

## 2017-02-23 ENCOUNTER — Encounter (HOSPITAL_COMMUNITY): Payer: Self-pay

## 2017-02-23 ENCOUNTER — Emergency Department (HOSPITAL_COMMUNITY): Payer: Medicare Other

## 2017-02-23 ENCOUNTER — Ambulatory Visit (INDEPENDENT_AMBULATORY_CARE_PROVIDER_SITE_OTHER): Payer: Medicare Other | Admitting: Family Medicine

## 2017-02-23 ENCOUNTER — Encounter: Payer: Self-pay | Admitting: Family Medicine

## 2017-02-23 VITALS — BP 144/91 | HR 56 | Temp 98.8°F | Ht 65.5 in | Wt 188.0 lb

## 2017-02-23 DIAGNOSIS — I6523 Occlusion and stenosis of bilateral carotid arteries: Secondary | ICD-10-CM | POA: Diagnosis not present

## 2017-02-23 DIAGNOSIS — Z91041 Radiographic dye allergy status: Secondary | ICD-10-CM

## 2017-02-23 DIAGNOSIS — Z86010 Personal history of colonic polyps: Secondary | ICD-10-CM

## 2017-02-23 DIAGNOSIS — Z885 Allergy status to narcotic agent status: Secondary | ICD-10-CM

## 2017-02-23 DIAGNOSIS — Z7902 Long term (current) use of antithrombotics/antiplatelets: Secondary | ICD-10-CM

## 2017-02-23 DIAGNOSIS — Z888 Allergy status to other drugs, medicaments and biological substances status: Secondary | ICD-10-CM | POA: Diagnosis not present

## 2017-02-23 DIAGNOSIS — Z8673 Personal history of transient ischemic attack (TIA), and cerebral infarction without residual deficits: Secondary | ICD-10-CM | POA: Diagnosis not present

## 2017-02-23 DIAGNOSIS — E890 Postprocedural hypothyroidism: Secondary | ICD-10-CM

## 2017-02-23 DIAGNOSIS — Q211 Atrial septal defect: Secondary | ICD-10-CM

## 2017-02-23 DIAGNOSIS — E785 Hyperlipidemia, unspecified: Secondary | ICD-10-CM | POA: Diagnosis not present

## 2017-02-23 DIAGNOSIS — R202 Paresthesia of skin: Secondary | ICD-10-CM

## 2017-02-23 DIAGNOSIS — Z91013 Allergy to seafood: Secondary | ICD-10-CM

## 2017-02-23 DIAGNOSIS — E039 Hypothyroidism, unspecified: Secondary | ICD-10-CM | POA: Diagnosis present

## 2017-02-23 DIAGNOSIS — R9082 White matter disease, unspecified: Secondary | ICD-10-CM | POA: Diagnosis present

## 2017-02-23 DIAGNOSIS — Z8249 Family history of ischemic heart disease and other diseases of the circulatory system: Secondary | ICD-10-CM

## 2017-02-23 DIAGNOSIS — R200 Anesthesia of skin: Secondary | ICD-10-CM

## 2017-02-23 DIAGNOSIS — I6381 Other cerebral infarction due to occlusion or stenosis of small artery: Secondary | ICD-10-CM | POA: Diagnosis present

## 2017-02-23 DIAGNOSIS — G8194 Hemiplegia, unspecified affecting left nondominant side: Secondary | ICD-10-CM | POA: Diagnosis not present

## 2017-02-23 DIAGNOSIS — M549 Dorsalgia, unspecified: Secondary | ICD-10-CM | POA: Diagnosis present

## 2017-02-23 DIAGNOSIS — I639 Cerebral infarction, unspecified: Secondary | ICD-10-CM | POA: Diagnosis not present

## 2017-02-23 DIAGNOSIS — I10 Essential (primary) hypertension: Secondary | ICD-10-CM | POA: Diagnosis not present

## 2017-02-23 DIAGNOSIS — Z9009 Acquired absence of other part of head and neck: Secondary | ICD-10-CM

## 2017-02-23 DIAGNOSIS — G8929 Other chronic pain: Secondary | ICD-10-CM | POA: Diagnosis present

## 2017-02-23 DIAGNOSIS — Z87891 Personal history of nicotine dependence: Secondary | ICD-10-CM

## 2017-02-23 DIAGNOSIS — M545 Low back pain, unspecified: Secondary | ICD-10-CM | POA: Diagnosis present

## 2017-02-23 DIAGNOSIS — Z79899 Other long term (current) drug therapy: Secondary | ICD-10-CM

## 2017-02-23 DIAGNOSIS — Q2112 Patent foramen ovale: Secondary | ICD-10-CM

## 2017-02-23 LAB — COMPREHENSIVE METABOLIC PANEL
ALBUMIN: 3.8 g/dL (ref 3.5–5.0)
ALK PHOS: 114 U/L (ref 38–126)
ALT: 51 U/L (ref 14–54)
ANION GAP: 8 (ref 5–15)
AST: 33 U/L (ref 15–41)
BILIRUBIN TOTAL: 0.6 mg/dL (ref 0.3–1.2)
BUN: 16 mg/dL (ref 6–20)
CALCIUM: 9.5 mg/dL (ref 8.9–10.3)
CO2: 24 mmol/L (ref 22–32)
CREATININE: 0.73 mg/dL (ref 0.44–1.00)
Chloride: 108 mmol/L (ref 101–111)
GFR calc non Af Amer: >60 mL/min (ref >60)
GLUCOSE: 102 mg/dL — AB (ref 65–99)
Potassium: 3.9 mmol/L (ref 3.5–5.1)
SODIUM: 140 mmol/L (ref 135–145)
TOTAL PROTEIN: 7.5 g/dL (ref 6.5–8.1)

## 2017-02-23 LAB — CBC
HCT: 38.7 % (ref 36.0–46.0)
Hemoglobin: 13 g/dL (ref 12.0–15.0)
MCH: 30.3 pg (ref 26.0–34.0)
MCHC: 33.6 g/dL (ref 30.0–36.0)
MCV: 90.2 fL (ref 78.0–100.0)
Platelets: 160 10*3/uL (ref 150–400)
RBC: 4.29 MIL/uL (ref 3.87–5.11)
RDW: 13.2 % (ref 11.5–15.5)
WBC: 7 10*3/uL (ref 4.0–10.5)

## 2017-02-23 LAB — I-STAT CHEM 8, ED
BUN: 19 mg/dL (ref 6–20)
CALCIUM ION: 1.2 mmol/L (ref 1.15–1.40)
CHLORIDE: 109 mmol/L (ref 101–111)
CREATININE: 0.7 mg/dL (ref 0.44–1.00)
Glucose, Bld: 98 mg/dL (ref 65–99)
HCT: 37 % (ref 36.0–46.0)
Hemoglobin: 12.6 g/dL (ref 12.0–15.0)
Potassium: 3.9 mmol/L (ref 3.5–5.1)
SODIUM: 141 mmol/L (ref 135–145)
TCO2: 24 mmol/L (ref 0–100)

## 2017-02-23 LAB — DIFFERENTIAL
Basophils Absolute: 0 10*3/uL (ref 0.0–0.1)
Basophils Relative: 0 %
EOS PCT: 3 %
Eosinophils Absolute: 0.2 10*3/uL (ref 0.0–0.7)
LYMPHS ABS: 2.5 10*3/uL (ref 0.7–4.0)
LYMPHS PCT: 35 %
MONO ABS: 0.4 10*3/uL (ref 0.1–1.0)
Monocytes Relative: 6 %
Neutro Abs: 3.9 10*3/uL (ref 1.7–7.7)
Neutrophils Relative %: 56 %

## 2017-02-23 LAB — I-STAT TROPONIN, ED: Troponin i, poc: 0.01 ng/mL (ref 0.00–0.08)

## 2017-02-23 LAB — PROTIME-INR
INR: 0.97
Prothrombin Time: 12.9 seconds (ref 11.4–15.2)

## 2017-02-23 LAB — APTT: aPTT: 31 seconds (ref 24–36)

## 2017-02-23 MED ORDER — BUTALBITAL-APAP-CAFFEINE 50-325-40 MG PO TABS: ORAL_TABLET | ORAL | 0 refills | Status: DC

## 2017-02-23 NOTE — ED Triage Notes (Signed)
Per Pt, Pt is coming from PCP with complaints of right numbness in the right arm and right lips. Denies confusion or slurred speech. Reports hx of gait abnormalities per chronic back hx. Pt has hx of TIA.

## 2017-02-23 NOTE — ED Provider Notes (Signed)
[facility]-EMERGENCY DEPT Provider Note   CSN: [id number] Arrival date & time: 02/23/17  1057   History   Chief Complaint Chief Complaint  Patient presents with  . Numbness    HPI Abigail [name] is a 71 y.o. female.  Patient reports she was in her usual state of health until yesterday at 8-9am when she noticed numbness in all of her fingers except her thumb of the right hand.  She also notes associated numbness on the right upper and lower lips.  No other facial numbness.  No visual disturbance, difficulty speaking, difficulty word finding or swallowing.  No change in hearing.  She reports some mild weakness in finger but notes that this is from not being able to feel things.  No new weakness elsewhere.  She notes a left sided headache that started behind her eye this am.  She took Fiorecet with moderate response.  She has a h/o CVA in 2006 and several "smaller strokes" since.  She notes some balance difficulties since these strokes but no worsening since onset of new symptoms.  She sees Dr Pearlean Brownie.    Last MRI 11/2015 which showed subacute lacunar infarct on the right frontal lobe.  She reports compliance with all medications, including Zestoretic, Verapamil, Lipitor, Plavix.    She sees Dr Jens Som for cardiology.  She notes last cath was about 2 years ago, which was normal.     Past Medical History:  Diagnosis Date  . Bronchitis 09/2016  . Bruises easily   . DDD (degenerative disc disease)   . Degenerative joint disease of cervical spine 09-05-11   Cervival area, now some osteoarthritis-lower back and Rt. shoulder  . Depression   . Diverticulitis 09-05-11   hx. gastritis, diverticulitis x2 -now surgery planned  . DIVERTICULITIS, ACUTE 02/10/2010  . Essential hypertension 09-05-11   tx. Verapamil  . GROIN PAIN 09/21/2010  . Headache(784.0)    headaches are better  . Hearing loss   . Heart palpitations   . Hemorrhoids   . History of bronchitis   . History of colonic polyps   .  History of pneumonia   . History of tension headache   . Hyperlipidemia   . Hypertension   . Hypothyroidism 09-05-11   Supplement used  . Late effect of adverse effect of drug, medicinal or biological substance   . NECK PAIN, ACUTE 09/21/2010  . Nocturia   . Osteoarthritis 112-17-12   spine and rt. hip, rt. shoulder  . Patent foramen ovale    Small - unable to be closed -   . Pneumonia   . PONV (postoperative nausea and vomiting)   . Sleep apnea 09-05-11   no cpap ever, had surgery to remove cartilage, no problems now  . SORE THROAT 04/30/2009  . Stroke (HCC) 09-05-11   2006/2009-(loss of memory, balance issues remains occ.)  . TIA 09/28/2007, 10/2014  . Unstable angina (HCC)    a. 05/2010 Cath: nl cors, EF 55%;  b. 05/2015 Lexiscan MV: small, severe, fixed apical defect and a small, severe, reversible inf lateral defect w/ apical thinning and mild ischemia, EF 54%.  Marland Kitchen URI 09/02/2008  . Vertigo     Patient Active Problem List   Diagnosis Date Noted  . Spondylolisthesis of lumbar region 05/03/2016  . Lacunar infarct, acute (HCC) 11/17/2015  . Dizziness and giddiness 06/04/2015  . Right-sided lacunar infarction (HCC) 06/04/2015  . Lacunar ataxic hemiparesis (HCC) 06/03/2015  . Unstable angina (HCC)   . Chest pain 05/07/2015  .  Tick bite of right forearm 09/04/2014  . Ventral incisional hernia 12/13/2013  . Benign microscopic hematuria 03/05/2012  . Diverticulitis of sigmoid colon 05/04/2011  . PRECORDIAL PAIN 06/08/2010  . PALPITATIONS 05/27/2010  . CERVICAL LYMPHADENOPATHY, RIGHT 05/15/2008  . Headache 02/28/2008  . TENSION HEADACHE 02/12/2008  . Sebaceous cyst 12/11/2007  . PROBLEMS WITH SWALLOWING AND MASTICATION 11/20/2007  . COLONIC POLYPS, HX OF 07/23/2007  . Hypothyroidism 03/06/2007  . HYPERLIPIDEMIA 03/06/2007  . Essential hypertension 03/06/2007  . LOW BACK PAIN 03/06/2007  . History of cardiovascular disorder 03/06/2007    Past Surgical History:  Procedure  Laterality Date  . ABDOMINAL HYSTERECTOMY    . CARDIAC CATHETERIZATION N/A 06/01/2015   Procedure: Left Heart Cath and Coronary Angiography;  Surgeon: Kathleene Hazel, MD;  Location: [facility] INVASIVE CV LAB;  Service: Cardiovascular;  Laterality: N/A;  . COLON RESECTION  09/07/2011   Procedure: LAPAROSCOPIC SIGMOID COLON RESECTION;  Surgeon: Mariella Saa, MD;  Location: [facility] ORS;  Service: General;  Laterality: N/A;  with proctoscopy  . CYST EXCISION N/A 11/29/2016   Procedure: EXCISION UMBILICAL CYST;  Surgeon: Glenna Fellows, MD;  Location: [facility] ORS;  Service: General;  Laterality: N/A;  . ELBOW SURGERY  09-05-11   left elbow -ligament repair  . EYE SURGERY     cataract  . INSERTION OF MESH N/A 12/13/2013   Procedure: INSERTION OF MESH;  Surgeon: Mariella Saa, MD;  Location: [facility];  Service: General;  Laterality: N/A;  . LAPAROSCOPY  09/07/2011   Procedure: LAPAROSCOPY DIAGNOSTIC;  Surgeon: Roselle Locus II;  Location: [facility] OR[facility];  Service: Gynecology;  Laterality: N/A;  . MAXIMUM ACCESS (MAS)POSTERIOR LUMBAR INTERBODY FUSION (PLIF) 2 LEVEL N/A 05/03/2016   Procedure: L4-5 L5-S1 Maximum access posterior lumbar interbody fusion;  Surgeon: Maeola Harman, MD;  Location: [facility] NEURO ORS;  Service: Neurosurgery;  Laterality: N/A;  L4-5 L5-S1 Maximum access posterior lumbar interbody fusion  . SALPINGOOPHORECTOMY  09/07/2011   Procedure: SALPINGO OOPHERECTOMY;  Surgeon: Roselle Locus II;  Location: [facility];  Service: Gynecology;  Laterality: Right;  . THYROIDECTOMY, PARTIAL  09-05-11  . TUBAL LIGATION  1983  . UMBILICAL HERNIA REPAIR  yrs ago  . VENTRAL HERNIA REPAIR  06/20/2012   Procedure: LAPAROSCOPIC VENTRAL HERNIA;  Surgeon: Mariella Saa, MD;  Location: [facility];  Service: General;  Laterality: N/A;  Laparoscopic Repair of Ventral Hernia with mesh  . VENTRAL HERNIA REPAIR N/A 12/13/2013   Procedure: LAPAROSCOPIC REPAIR RECURRENT VENTRAL INCISIONAL  HERNIA;  Surgeon: Mariella Saa, MD;  Location: [facility];  Service: General;  Laterality: N/A;    OB History    No data available       Home Medications    Prior to Admission medications   Medication Sig Start Date End Date Taking? Authorizing Provider  acetaminophen (TYLENOL) 500 MG tablet Take 1,000 mg by mouth every 6 (six) hours as needed.    [provider]  atorvastatin (LIPITOR) 40 MG tablet TAKE 1 TABLET BY MOUTH EVERY DAY Patient taking differently: TAKE 1 TABLET BY MOUTH EVERY DAY IN THE EVENING 08/29/16   Gordy Savers, MD  BIOTIN 5000 PO Take 5,000 mcg by mouth daily.    [provider]  butalbital-acetaminophen-caffeine (FIORICET, ESGIC) [phone] MG tablet TAKE 1 TABLET BY MOUTH EVERY 6 HOURS AS NEEDED FOR HEADACHE 02/23/17   Nelwyn Salisbury, MD  clopidogrel (PLAVIX) 75 MG tablet Take 1 tablet (75 mg total) by mouth every evening. 06/18/14  Gordy SaversKwiatkowski, Peter F, MD  clopidogrel (PLAVIX) 75 MG tablet TAKE 1 TABLET BY MOUTH ONCE A DAY Patient not taking: Reported on 02/23/2017 01/09/17   Gordy SaversKwiatkowski, Peter F, MD  diclofenac (VOLTAREN) 75 MG EC tablet Take 75 mg by mouth 2 (two) times daily. 08/10/16   [provider]  diclofenac (VOLTAREN) 75 MG EC tablet TAKE 1 TABLET (75 MG TOTAL) BY MOUTH 2 (TWO) TIMES DAILY. Patient not taking: Reported on 02/23/2017 02/06/17   Gordy SaversKwiatkowski, Peter F, MD  levothyroxine (SYNTHROID, LEVOTHROID) 125 MCG tablet TAKE 1 TABLET BY MOUTH DAILY BEFORE BREAKFAST. 02/15/17   Gordy SaversKwiatkowski, Peter F, MD  lisinopril-hydrochlorothiazide (PRINZIDE,ZESTORETIC) 20-12.5 MG tablet TAKE 1 TABLET BY MOUTH DAILY. 01/09/17   Gordy SaversKwiatkowski, Peter F, MD  morphine (MSIR) 15 MG tablet Take 1 tablet (15 mg total) by mouth every 4 (four) hours as needed for severe pain. Patient not taking: Reported on 02/23/2017 11/29/16   Glenna FellowsHoxworth, Benjamin, MD  tetrahydrozoline 0.05 % ophthalmic solution Place 2 drops into both eyes as needed (for red eyes). Reported on 02/12/2016    [provider]  traMADol (ULTRAM) 50 MG tablet TAKE 1 TABLET BY MOUTH EVERY 6 HOURS AS NEEDED FOR PAIN 02/16/17   Gordy SaversKwiatkowski, Peter F, MD  verapamil (VERELAN PM) 240 MG 24 hr capsule Take 1 capsule (240 mg total) by mouth at bedtime. 01/02/17   Lewayne Buntingrenshaw, Brian S, MD    Family History Family History  Problem Relation Age of Onset  . Arrhythmia Mother   . Hypertension Mother   . Stroke Mother   . Stroke Maternal Grandmother   . Diabetes Paternal Grandmother     Social History Social History  Substance Use Topics  . Smoking status: Former Smoker    Packs/day: 0.50    Years: 5.00    Types: Cigarettes    Quit date: 09/19/1968  . Smokeless tobacco: Never Used  . Alcohol use 1.2 oz/week    1 Cans of beer, 1 Shots of liquor per week     Comment: rarely     Allergies   Dilaudid [hydromorphone hcl]; Shellfish allergy; Aggrenox [aspirin-dipyridamole er]; Cephalexin; Codeine phosphate; Contrast media [iodinated diagnostic agents]; Hydrocodone; Meperidine hcl; and Percocet [oxycodone-acetaminophen]   Review of Systems Review of Systems  Constitutional: Negative for activity change, appetite change, chills, fatigue and fever.  HENT: Negative for tinnitus, trouble swallowing and voice change.   Eyes: Negative for photophobia and visual disturbance.  Respiratory: Negative for shortness of breath.   Cardiovascular: Negative for chest pain and palpitations.  Gastrointestinal: Negative for nausea and vomiting.  Musculoskeletal: Positive for gait problem (reports balance problems at baseline 2/2 CVA 2006).  Neurological: Positive for dizziness (has BPPV at baseline), weakness (LLE after back surgery), numbness (right side of mouth and right fingers) and headaches (started this am on left). Negative for syncope, facial asymmetry and speech difficulty.     Physical Exam Updated Vital Signs BP 138/80   Pulse (!) 53   Temp 98.1 F (36.7 C)   Resp 18   Ht 5' 5.5" (1.664 m)   Wt 85.3 kg  (188 lb)   SpO2 98%   BMI 30.81 kg/m   Physical Exam  Constitutional: She is oriented to person, place, and time. She appears well-developed and well-nourished. No distress.  HENT:  Head: Normocephalic and atraumatic.  Mouth/Throat:    Eyes: Conjunctivae and EOM are normal. Pupils are equal, round, and reactive to light.  Neck: Normal range of motion. Neck supple.  Cardiovascular: Normal rate,  regular rhythm, normal heart sounds and intact distal pulses.   Pulmonary/Chest: Effort normal and breath sounds normal. No respiratory distress.  Abdominal: Soft. Bowel sounds are normal. She exhibits no distension and no mass. There is no tenderness. There is no guarding.  Musculoskeletal:       Left hip: She exhibits decreased range of motion and decreased strength. She exhibits no tenderness.  She reports chronic LLE weakness after back surgery.  Neurological: She is alert and oriented to person, place, and time. A sensory deficit (along the right upper and lower lips. all fingers of the right hand with decreased light touch sensation) is present.  Normal UE and LE cerebellar testing.  Good grip strength w/ normal AROM of UE.  With exception of numbness in the lips on the right, CN 2-12 grossly intact.  Decreased light sensation in right fingers.  Light touch sensation in tact LE and remainder of RUE. AOx3 with normal speech and thought.   Skin: Skin is warm and dry. Capillary refill takes less than 2 seconds. She is not diaphoretic.  Psychiatric: She has a normal mood and affect. Her behavior is normal. Judgment and thought content normal.  Nursing note and vitals reviewed.  ED Treatments / Results  Labs (all labs ordered are listed, but only abnormal results are displayed) Labs Reviewed  COMPREHENSIVE METABOLIC PANEL - Abnormal; Notable for the following:       Result Value   Glucose, Bld 102 (*)    All other components within normal limits  PROTIME-INR  APTT  CBC  DIFFERENTIAL    I-STAT TROPOININ, ED  CBG MONITORING, ED  I-STAT CHEM 8, ED    EKG  EKG Interpretation None       Radiology Ct Head Wo Contrast  Result Date: 02/23/2017 CLINICAL DATA:  Right hand and right side of mouth numbness for 1 day EXAM: CT HEAD WITHOUT CONTRAST TECHNIQUE: Contiguous axial images were obtained from the base of the skull through the vertex without intravenous contrast. COMPARISON:  Brain MRI November 26, 2015 FINDINGS: Brain: The ventricles are normal in size and configuration. There is no intracranial mass, hemorrhage, extra-axial fluid collection, or midline shift. There is patchy small vessel disease in the centra semiovale bilaterally. There is a prior focal lacunar infarct mid right centrum semiovale, stable. There is decreased attenuation in the posterolateral right basal ganglia, a likely prior small infarct in this area. There is a prior small infarct in the posteromedial pons-midbrain junction region. No acute appearing infarct is appreciable by CT. Vascular: There is no appreciable hyperdense vessel. There is calcification in each carotid siphon region. Skull: The bony calvarium appears intact. Sinuses/Orbits: There is mucosal thickening in multiple ethmoid air cells bilaterally. There is slight mucosal thickening along the anterior left sphenoid sinus. Other visualized paranasal sinuses are clear. Orbits appear symmetric bilaterally. Other:  Visualized mastoid air cells are clear IMPRESSION: Small vessel disease with prior small infarcts, unchanged. No acute infarct is demonstrated on this noncontrast CT examination. No intracranial mass, hemorrhage, or extra-axial fluid collection. Areas of arterial vascular calcification noted. There are areas of paranasal sinus disease. Electronically Signed   By: Bretta Bang III M.D.   On: 02/23/2017 11:54    Procedures Procedures (including critical care time)  Medications Ordered in ED Medications - No data to display   Initial  Impression / Assessment and Plan / ED Course  I have reviewed the triage vital signs and the nursing notes.  Pertinent labs & imaging results  that were available during my care of the patient were reviewed by me and considered in my medical decision making (see chart for details).    1433: Discussed care with Neurologist on call.  He recommends obtaining MRI brain.    1457: Dr Roxy Manns, neurology, at bedside.  Per neuro, if negative MRI brain patient may discharged home.  1545: Patient and husband updated w/ plan.  Awaiting MRI brain.  1608: Awaiting MRI brain for dispo.  Patient care transferred to EDP, Dr Broadus John.   Final Clinical Impressions(s) / ED Diagnoses   Final diagnoses:  Lip numbness  Numbness of right hand    New Prescriptions New Prescriptions   No medications on file     Raliegh Ip, DO 02/23/17 1608    Arby Barrette, MD 02/27/17 1654

## 2017-02-23 NOTE — Progress Notes (Signed)
   Subjective:    Patient ID: Abigail [name], female    DOB: 03/05/1946, 71 y.o.   MRN: [id number]  HPI Here for 24 hours of numbness and tingling in the right side of her face and the right side of her mouth, as well as in the right arm and hand. No slurred speech or vision changes. No weakness. She has had several strokes in the past. She takes Plavix. No SOB or chest pain or headaches. Her husband drove her here.    Review of Systems  Constitutional: Negative.   Respiratory: Negative.   Cardiovascular: Negative.   Neurological: Positive for numbness. Negative for dizziness, seizures, syncope, facial asymmetry, speech difficulty, weakness and headaches.       Objective:   Physical Exam  Constitutional: She is oriented to person, place, and time. She appears well-developed and well-nourished. No distress.  HENT:  Head: Normocephalic and atraumatic.  Eyes: Conjunctivae and EOM are normal. Pupils are equal, round, and reactive to light.  Neck: Neck supple. No thyromegaly present.  Cardiovascular: Normal rate, regular rhythm, normal heart sounds and intact distal pulses.   Pulmonary/Chest: Effort normal and breath sounds normal.  Lymphadenopathy:    She has no cervical adenopathy.  Neurological: She is alert and oriented to person, place, and time. No cranial nerve deficit. She exhibits normal muscle tone. Coordination normal.  Decreased sensation to light touch in the right forearm, right hand, and right cheek           Assessment & Plan:  Numbness in the right face and right hand, suggestive of an evolving stroke. We will have her [facility].  Gershon CraneStephen Guneet Delpino, MD

## 2017-02-23 NOTE — Patient Instructions (Signed)
WE NOW OFFER   [name] FAST TRACK!!!  SAME DAY Appointments for ACUTE CARE  Such as: Sprains, Injuries, cuts, abrasions, rashes, muscle pain, joint pain, back pain Colds, flu, sore throats, headache, allergies, cough, fever  Ear pain, sinus and eye infections Abdominal pain, nausea, vomiting, diarrhea, upset stomach Animal/insect bites  3 Easy Ways to Schedule: Walk-In Scheduling Call in scheduling Mychart Sign-up: [email or web address]         

## 2017-02-23 NOTE — ED Notes (Signed)
Neuro at bedside.

## 2017-02-23 NOTE — ED Notes (Signed)
Pt on way to MRI.  

## 2017-02-23 NOTE — ED Notes (Signed)
Pt passed swallow screen earlier. Given ginger ale to drink.

## 2017-02-23 NOTE — Consult Note (Signed)
Requesting Physician: Dr. Pfeirrer    Chief ComplaintFerd Glassing: numbness  History obtained from:  Patient     HPI:                                                                                                                                         Abigail [name] is an 71 y.o. female who went to breakfast and then the store when she noted both hands (fingers were numb). She thought she was having allergic reaction.  Due to the numbness not resolving called her PCP. HA not changed since then, Numbness in on the palmar surface of the right palmer surface and right lip. No trouble with speaking, swallowing or weakness. She does have pain over her left eye. She does have HA at baseline. She takes Fioricet which helps and did take a dose this AM but it is wear off at this time.   Date last known well: Date: 02/22/2017 Time last known well: Time: 08:00 tPA Given: No: out of window   Past Medical History:  Diagnosis Date  . Bronchitis 09/2016  . Bruises easily   . DDD (degenerative disc disease)   . Degenerative joint disease of cervical spine 09-05-11   Cervival area, now some osteoarthritis-lower back and Rt. shoulder  . Depression   . Diverticulitis 09-05-11   hx. gastritis, diverticulitis x2 -now surgery planned  . DIVERTICULITIS, ACUTE 02/10/2010  . Essential hypertension 09-05-11   tx. Verapamil  . GROIN PAIN 09/21/2010  . Headache(784.0)    headaches are better  . Hearing loss   . Heart palpitations   . Hemorrhoids   . History of bronchitis   . History of colonic polyps   . History of pneumonia   . History of tension headache   . Hyperlipidemia   . Hypertension   . Hypothyroidism 09-05-11   Supplement used  . Late effect of adverse effect of drug, medicinal or biological substance   . NECK PAIN, ACUTE 09/21/2010  . Nocturia   . Osteoarthritis 112-17-12   spine and rt. hip, rt. shoulder  . Patent foramen ovale    Small - unable to be closed -   . Pneumonia   . PONV (postoperative  nausea and vomiting)   . Sleep apnea 09-05-11   no cpap ever, had surgery to remove cartilage, no problems now  . SORE THROAT 04/30/2009  . Stroke (HCC) 09-05-11   2006/2009-(loss of memory, balance issues remains occ.)  . TIA 09/28/2007, 10/2014  . Unstable angina (HCC)    a. 05/2010 Cath: nl cors, EF 55%;  b. 05/2015 Lexiscan MV: small, severe, fixed apical defect and a small, severe, reversible inf lateral defect w/ apical thinning and mild ischemia, EF 54%.  Marland Kitchen. URI 09/02/2008  . Vertigo     Past Surgical History:  Procedure Laterality Date  . ABDOMINAL HYSTERECTOMY    . CARDIAC CATHETERIZATION N/A  06/01/2015   Procedure: Left Heart Cath and Coronary Angiography;  Surgeon: Kathleene Hazel, MD;  Location[facility] INVASIVE CV LAB;  Service: Cardiovascular;  Laterality: N/A;  . COLON RESECTION  09/07/2011   Procedure: LAPAROSCOPIC SIGMOID COLON RESECTION;  Surgeon: Mariella Saa, MD;  Location: [facility] ORS;  Service: General;  Laterality: N/A;  with proctoscopy  . CYST EXCISION N/A 11/29/2016   Procedure: EXCISION UMBILICAL CYST;  Surgeon: Glenna Fellows, MD;  Location: [facility];  Service: General;  Laterality: N/A;  . ELBOW SURGERY  09-05-11   left elbow -ligament repair  . EYE SURGERY     cataract  . INSERTION OF MESH N/A 12/13/2013   Procedure: INSERTION OF MESH;  Surgeon: Mariella Saa, MD;  Location: [facility];  Service: General;  Laterality: N/A;  . LAPAROSCOPY  09/07/2011   Procedure: LAPAROSCOPY DIAGNOSTIC;  Surgeon: Roselle Locus II;  Location: [facility];  Service: Gynecology;  Laterality: N/A;  . MAXIMUM ACCESS (MAS)POSTERIOR LUMBAR INTERBODY FUSION (PLIF) 2 LEVEL N/A 05/03/2016   Procedure: L4-5 L5-S1 Maximum access posterior lumbar interbody fusion;  Surgeon: Maeola Harman, MD;  Location: [facility];  Service: Neurosurgery;  Laterality: N/A;  L4-5 L5-S1 Maximum access posterior lumbar interbody fusion  . SALPINGOOPHORECTOMY  09/07/2011   Procedure: SALPINGO OOPHERECTOMY;   Surgeon: Roselle Locus II;  Location: [facility];  Service: Gynecology;  Laterality: Right;  . THYROIDECTOMY, PARTIAL  09-05-11  . TUBAL LIGATION  1983  . UMBILICAL HERNIA REPAIR  yrs ago  . VENTRAL HERNIA REPAIR  06/20/2012   Procedure: LAPAROSCOPIC VENTRAL HERNIA;  Surgeon: Mariella Saa, MD;  Location: [facility];  Service: General;  Laterality: N/A;  Laparoscopic Repair of Ventral Hernia with mesh  . VENTRAL HERNIA REPAIR N/A 12/13/2013   Procedure: LAPAROSCOPIC REPAIR RECURRENT VENTRAL INCISIONAL  HERNIA;  Surgeon: Mariella Saa, MD;  Location: [facility];  Service: General;  Laterality: N/A;    Family History  Problem Relation Age of Onset  . Arrhythmia Mother   . Hypertension Mother   . Stroke Mother   . Stroke Maternal Grandmother   . Diabetes Paternal Grandmother    Social History:  reports that she quit smoking about 48 years ago. Her smoking use included Cigarettes. She has a 2.50 pack-year smoking history. She has never used smokeless tobacco. She reports that she drinks about 1.2 oz of alcohol per week . She reports that she does not use drugs.  Allergies:  Allergies  Allergen Reactions  . Dilaudid [Hydromorphone Hcl] Shortness Of Breath, Nausea And Vomiting and Other (See Comments)    Able to take morphine without issue  . Shellfish Allergy Shortness Of Breath and Rash    Only shrimp allergy  . Aggrenox [Aspirin-Dipyridamole Er] Nausea And Vomiting and Other (See Comments)    Headache   . Cephalexin Itching and Rash  . Codeine Phosphate Nausea And Vomiting  . Contrast Media [Iodinated Diagnostic Agents] Nausea And Vomiting  . Hydrocodone Nausea And Vomiting and Other (See Comments)    Can take morphine without issue  . Meperidine Hcl Nausea And Vomiting  . Percocet [Oxycodone-Acetaminophen] Nausea And Vomiting and Other (See Comments)    Can take morphine without issue    Medications:  No current facility-administered medications for this encounter.    Current Outpatient Prescriptions  Medication Sig Dispense Refill  . acetaminophen (TYLENOL) 500 MG tablet Take 1,000 mg by mouth every 6 (six) hours as needed.    Marland Kitchen atorvastatin (LIPITOR) 40 MG tablet TAKE 1 TABLET BY MOUTH EVERY DAY (Patient taking differently: TAKE 1 TABLET BY MOUTH EVERY DAY IN THE EVENING) 90 tablet 1  . BIOTIN 5000 PO Take 5,000 mcg by mouth daily.    . butalbital-acetaminophen-caffeine (FIORICET, ESGIC) 50-325-40 MG tablet TAKE 1 TABLET BY MOUTH EVERY 6 HOURS AS NEEDED FOR HEADACHE 60 tablet 0  . clopidogrel (PLAVIX) 75 MG tablet Take 1 tablet (75 mg total) by mouth every evening. 90 tablet 1  . clopidogrel (PLAVIX) 75 MG tablet TAKE 1 TABLET BY MOUTH ONCE A DAY (Patient not taking: Reported on 02/23/2017) 90 tablet 1  . diclofenac (VOLTAREN) 75 MG EC tablet Take 75 mg by mouth 2 (two) times daily.  1  . diclofenac (VOLTAREN) 75 MG EC tablet TAKE 1 TABLET (75 MG TOTAL) BY MOUTH 2 (TWO) TIMES DAILY. (Patient not taking: Reported on 02/23/2017) 180 tablet 0  . levothyroxine (SYNTHROID, LEVOTHROID) 125 MCG tablet TAKE 1 TABLET BY MOUTH DAILY BEFORE BREAKFAST. 90 tablet 0  . lisinopril-hydrochlorothiazide (PRINZIDE,ZESTORETIC) 20-12.5 MG tablet TAKE 1 TABLET BY MOUTH DAILY. 90 tablet 1  . morphine (MSIR) 15 MG tablet Take 1 tablet (15 mg total) by mouth every 4 (four) hours as needed for severe pain. (Patient not taking: Reported on 02/23/2017) 15 tablet 0  . tetrahydrozoline 0.05 % ophthalmic solution Place 2 drops into both eyes as needed (for red eyes). Reported on 02/12/2016    . traMADol (ULTRAM) 50 MG tablet TAKE 1 TABLET BY MOUTH EVERY 6 HOURS AS NEEDED FOR PAIN 60 tablet 1  . verapamil (VERELAN PM) 240 MG 24 hr capsule Take 1 capsule (240 mg total) by mouth at bedtime. 90 capsule 3     ROS:                                                                                                                                        History obtained from the patient  General ROS: negative for - chills, fatigue, fever, night sweats, weight gain or weight loss Psychological ROS: negative for - behavioral disorder, hallucinations, memory difficulties, mood swings or suicidal ideation Ophthalmic ROS: negative for - blurry vision, double vision, eye pain or loss of vision ENT ROS: negative for - epistaxis, nasal discharge, oral lesions, sore throat, tinnitus or vertigo Allergy and Immunology ROS: negative for - hives or itchy/watery eyes Hematological and Lymphatic ROS: negative for - bleeding problems, bruising or swollen lymph nodes Endocrine ROS: negative for - galactorrhea, hair pattern changes, polydipsia/polyuria or temperature intolerance Respiratory ROS: negative for - cough, hemoptysis, shortness of breath or wheezing Cardiovascular ROS: negative for - chest pain, dyspnea on exertion, edema or  irregular heartbeat Gastrointestinal ROS: negative for - abdominal pain, diarrhea, hematemesis, nausea/vomiting or stool incontinence Genito-Urinary ROS: negative for - dysuria, hematuria, incontinence or urinary frequency/urgency Musculoskeletal ROS: negative for - joint swelling or muscular weakness Neurological ROS: as noted in HPI Dermatological ROS: negative for rash and skin lesion changes  Neurologic Examination:                                                                                                      Blood pressure (!) 160/91, pulse (!) 59, temperature 98.1 F (36.7 C), resp. rate 18, height 5' 5.5" (1.664 m), weight 85.3 kg (188 lb), SpO2 99 %.  HEENT-  Normocephalic, no lesions, without obvious abnormality.  Normal external eye and conjunctiva.  Normal TM's bilaterally.  Normal auditory canals and external ears. Normal external nose, mucus membranes and septum.  Normal pharynx. Cardiovascular- S1, S2 normal, pulses palpable throughout   Lungs- chest  clear, no wheezing, rales, normal symmetric air entry Abdomen- normal findings: bowel sounds normal Extremities- less then 2 second capillary refill Lymph-no adenopathy palpable Musculoskeletal-no joint tenderness, deformity or swelling Skin-warm and dry, no hyperpigmentation, vitiligo, or suspicious lesions  Neurological Examination Mental Status: Alert, oriented, thought content appropriate.  Speech fluent without evidence of aphasia.  Able to follow 3 step commands without difficulty. Cranial Nerves: II:  Visual fields grossly normal,  III,IV, VI: ptosis not present, extra-ocular motions intact bilaterally, pupils equal, round, reactive to light and accommodation V,VII: smile symmetric, right V2 tingling VIII: hearing normal bilaterally IX,X: uvula rises symmetrically XI: bilateral shoulder shrug XII: midline tongue extension Motor: Right : Upper extremity   5/5    Left:     Upper extremity   5/5  Lower extremity   5/5     Lower extremity   5/5 Tone and bulk:normal tone throughout; no atrophy noted Sensory: patchy right arm decreased sensation Deep Tendon Reflexes: 2+ and symmetric throughout Plantars: Right: downgoing   Left: downgoing Cerebellar: normal finger-to-nose, normal rapid alternating movements and normal heel-to-shin test Gait: not tested       Lab Results: Basic Metabolic Panel:  Recent Labs Lab 02/23/17 1114 02/23/17 1131  NA 140 141  K 3.9 3.9  CL 108 109  CO2 24  --   GLUCOSE 102* 98  BUN 16 19  CREATININE 0.73 0.70  CALCIUM 9.5  --     Liver Function Tests:  Recent Labs Lab 02/23/17 1114  AST 33  ALT 51  ALKPHOS 114  BILITOT 0.6  PROT 7.5  ALBUMIN 3.8   No results for input(s): LIPASE, AMYLASE in the last 168 hours. No results for input(s): AMMONIA in the last 168 hours.  CBC:  Recent Labs Lab 02/23/17 1114 02/23/17 1131  WBC 7.0  --   NEUTROABS 3.9  --   HGB 13.0 12.6  HCT 38.7 37.0  MCV 90.2  --   PLT 160  --      Cardiac Enzymes: No results for input(s): CKTOTAL, CKMB, CKMBINDEX, TROPONINI in the last 168 hours.  Lipid Panel: No results for input(s): CHOL, TRIG,  HDL, CHOLHDL, VLDL, LDLCALC in the last 168 hours.  CBG: No results for input(s): GLUCAP in the last 168 hours.  Microbiology: Results for orders placed or performed during the hospital encounter of 04/25/16  Surgical pcr screen     Status: None   Collection Time: 04/25/16 10:43 AM  Result Value Ref Range Status   MRSA, PCR NEGATIVE NEGATIVE Final   Staphylococcus aureus NEGATIVE NEGATIVE Final    Comment:        The Xpert SA Assay (FDA approved for NASAL specimens in patients over 28 years of age), is one component of a comprehensive surveillance program.  Test performance has been validated by [facility] for patients greater than or equal to 73 year old. It is not intended to diagnose infection nor to guide or monitor treatment.     Coagulation Studies:  Recent Labs  02/23/17 1114  LABPROT 12.9  INR 0.97    Imaging: Ct Head Wo Contrast  Result Date: 02/23/2017 CLINICAL DATA:  Right hand and right side of mouth numbness for 1 day EXAM: CT HEAD WITHOUT CONTRAST TECHNIQUE: Contiguous axial images were obtained from the base of the skull through the vertex without intravenous contrast. COMPARISON:  Brain MRI November 26, 2015 FINDINGS: Brain: The ventricles are normal in size and configuration. There is no intracranial mass, hemorrhage, extra-axial fluid collection, or midline shift. There is patchy small vessel disease in the centra semiovale bilaterally. There is a prior focal lacunar infarct mid right centrum semiovale, stable. There is decreased attenuation in the posterolateral right basal ganglia, a likely prior small infarct in this area. There is a prior small infarct in the posteromedial pons-midbrain junction region. No acute appearing infarct is appreciable by CT. Vascular: There is no appreciable hyperdense  vessel. There is calcification in each carotid siphon region. Skull: The bony calvarium appears intact. Sinuses/Orbits: There is mucosal thickening in multiple ethmoid air cells bilaterally. There is slight mucosal thickening along the anterior left sphenoid sinus. Other visualized paranasal sinuses are clear. Orbits appear symmetric bilaterally. Other:  Visualized mastoid air cells are clear IMPRESSION: Small vessel disease with prior small infarcts, unchanged. No acute infarct is demonstrated on this noncontrast CT examination. No intracranial mass, hemorrhage, or extra-axial fluid collection. Areas of arterial vascular calcification noted. There are areas of paranasal sinus disease. Electronically Signed   By: Bretta Bang III M.D.   On: 02/23/2017 11:54       Assessment and plan discussed with with attending physician and they are in agreement.    Felicie Morn PA-C [facility] [phone]  02/23/2017, 2:52 PM   Assessment: 71 y.o. female with 24 hours of right palmar numbness and right facial numbness in setting of HA. Differential includes complicated migraine versus TIA/CVA  Stroke Risk Factors - hyperlipidemia and hypertension   Recommend: Mri brain to rule out stroke

## 2017-02-23 NOTE — Progress Notes (Signed)
MRI confirms a punctate acute ischemic infarction involving the L thalamus. Recommend admission for routine stroke w/u as previously outlined.

## 2017-02-23 NOTE — H&P (Signed)
[facility] History and Physical  [name]:[id number] DOB: 1946-03-07 DOA: 02/23/2017  Referring physician: Dr Corlis Leak, [facility] ED PCP: Gordy Savers, MD   Chief Complaint: Numbness/ tingling of R face/ mouth and R arm and hand  HPI: Abigail [name] is a 71 y.o. female with history of several CVA's in the past, HTN, DDD, diverticulitis who presented with numbness in the R arm and R face, no speech problems, chronic gait issues from back pain.  CT head in ED showed nothing acute, however MRI showed "acute infarct of the anterolateral left thalamus, in close proximity to the posterior limb of the left internal capsule, in keeping with the reported right-sided numbness". Asked to see for new CVA admission.   Patient is feeling better, R side of face is still numb but R arm numbness has improved.  Has chronic L leg weakness.  Is hungry and wants to get to her room.  Gives a vague history.    Denies any CP, SOB, abd pain, n/v/d, urinary complaints.   MRI also showed >>  "Extensive white matter disease, progressed from the prior examination of 11/26/2015. While the findings may be secondary to chronic microvascular ischemia, the involvement of the corpus callosum and the perpendicular orientation of many of the lesions relative to the lateral ventricles raises the possibility of demyelinating disease. If the patient has never undergone CSF sampling, that should be considered.   PSH - had sigmoid colon resection in 2012, cardiac cath 9/16 (no sig CAD), back surgery Aug 2017, partial thyroidectomy 2012, VH repair in 2013 and 2015.      Past Medical History  Past Medical History:  Diagnosis Date  . Bronchitis 09/2016  . Bruises easily   . DDD (degenerative disc disease)   . Degenerative joint disease of cervical spine 09-05-11   Cervival area, now some osteoarthritis-lower back and Rt. shoulder  . Depression   . Diverticulitis 09-05-11   hx. gastritis, diverticulitis x2 -now surgery  planned  . DIVERTICULITIS, ACUTE 02/10/2010  . Essential hypertension 09-05-11   tx. Verapamil  . GROIN PAIN 09/21/2010  . Headache(784.0)    headaches are better  . Hearing loss   . Heart palpitations   . Hemorrhoids   . History of bronchitis   . History of colonic polyps   . History of pneumonia   . History of tension headache   . Hyperlipidemia   . Hypertension   . Hypothyroidism 09-05-11   Supplement used  . Late effect of adverse effect of drug, medicinal or biological substance   . NECK PAIN, ACUTE 09/21/2010  . Nocturia   . Osteoarthritis 112-17-12   spine and rt. hip, rt. shoulder  . Patent foramen ovale    Small - unable to be closed -   . Pneumonia   . PONV (postoperative nausea and vomiting)   . Sleep apnea 09-05-11   no cpap ever, had surgery to remove cartilage, no problems now  . SORE THROAT 04/30/2009  . Stroke (HCC) 09-05-11   2006/2009-(loss of memory, balance issues remains occ.)  . TIA 09/28/2007, 10/2014  . Unstable angina (HCC)    a. 05/2010 Cath: nl cors, EF 55%;  b. 05/2015 Lexiscan MV: small, severe, fixed apical defect and a small, severe, reversible inf lateral defect w/ apical thinning and mild ischemia, EF 54%.  Marland Kitchen URI 09/02/2008  . Vertigo    Past Surgical History  Past Surgical History:  Procedure Laterality Date  . ABDOMINAL HYSTERECTOMY    .  CARDIAC CATHETERIZATION N/A 06/01/2015   Procedure: Left Heart Cath and Coronary Angiography;  Surgeon: Kathleene Hazel, MD;  Location: [facility] INVASIVE CV LAB;  Service: Cardiovascular;  Laterality: N/A;  . COLON RESECTION  09/07/2011   Procedure: LAPAROSCOPIC SIGMOID COLON RESECTION;  Surgeon: Mariella Saa, MD;  Location: [facility] ORS;  Service: General;  Laterality: N/A;  with proctoscopy  . CYST EXCISION N/A 11/29/2016   Procedure: EXCISION UMBILICAL CYST;  Surgeon: Glenna Fellows, MD;  Location: [facility];  Service: General;  Laterality: N/A;  . ELBOW SURGERY  09-05-11   left elbow -ligament repair  .  EYE SURGERY     cataract  . INSERTION OF MESH N/A 12/13/2013   Procedure: INSERTION OF MESH;  Surgeon: Mariella Saa, MD;  Location: [facility];  Service: General;  Laterality: N/A;  . LAPAROSCOPY  09/07/2011   Procedure: LAPAROSCOPY DIAGNOSTIC;  Surgeon: Roselle Locus II;  Location: [facility];  Service: Gynecology;  Laterality: N/A;  . MAXIMUM ACCESS (MAS)POSTERIOR LUMBAR INTERBODY FUSION (PLIF) 2 LEVEL N/A 05/03/2016   Procedure: L4-5 L5-S1 Maximum access posterior lumbar interbody fusion;  Surgeon: Maeola Harman, MD;  Location: [facility];  Service: Neurosurgery;  Laterality: N/A;  L4-5 L5-S1 Maximum access posterior lumbar interbody fusion  . SALPINGOOPHORECTOMY  09/07/2011   Procedure: SALPINGO OOPHERECTOMY;  Surgeon: Roselle Locus II;  Location: [facility];  Service: Gynecology;  Laterality: Right;  . THYROIDECTOMY, PARTIAL  09-05-11  . TUBAL LIGATION  1983  . UMBILICAL HERNIA REPAIR  yrs ago  . VENTRAL HERNIA REPAIR  06/20/2012   Procedure: LAPAROSCOPIC VENTRAL HERNIA;  Surgeon: Mariella Saa, MD;  Location: [facility];  Service: General;  Laterality: N/A;  Laparoscopic Repair of Ventral Hernia with mesh  . VENTRAL HERNIA REPAIR N/A 12/13/2013   Procedure: LAPAROSCOPIC REPAIR RECURRENT VENTRAL INCISIONAL  HERNIA;  Surgeon: Mariella Saa, MD;  Location: [facility];  Service: General;  Laterality: N/A;   Family History  Family History  Problem Relation Age of Onset  . Arrhythmia Mother   . Hypertension Mother   . Stroke Mother   . Stroke Maternal Grandmother   . Diabetes Paternal Grandmother    Social History  reports that she quit smoking about 48 years ago. Her smoking use included Cigarettes. She has a 2.50 pack-year smoking history. She has never used smokeless tobacco. She reports that she drinks about 1.2 oz of alcohol per week . She reports that she does not use drugs. Allergies  Allergies  Allergen Reactions  . Dilaudid [Hydromorphone Hcl] Shortness Of Breath, Nausea And  Vomiting and Other (See Comments)    Able to take morphine without issue  . Shellfish Allergy Shortness Of Breath and Rash    Only shrimp allergy  . Aggrenox [Aspirin-Dipyridamole Er] Nausea And Vomiting and Other (See Comments)    Headache   . Cephalexin Itching and Rash  . Codeine Phosphate Nausea And Vomiting  . Contrast Media [Iodinated Diagnostic Agents] Nausea And Vomiting  . Hydrocodone Nausea And Vomiting and Other (See Comments)    Can take morphine without issue  . Meperidine Hcl Nausea And Vomiting  . Percocet [Oxycodone-Acetaminophen] Nausea And Vomiting and Other (See Comments)    Can take morphine without issue   Home medications Prior to Admission medications   Medication Sig Start Date End Date Taking? Authorizing Provider  acetaminophen (TYLENOL) 500 MG tablet Take 1,000 mg by mouth every 6 (six) hours as needed for mild pain.    Yes [provider]  atorvastatin (LIPITOR) 40 MG tablet TAKE 1 TABLET BY MOUTH EVERY DAY Patient taking differently: TAKE 1 TABLET BY MOUTH EVERY DAY IN THE EVENING 08/29/16  Yes Gordy SaversKwiatkowski, Peter F, MD  BIOTIN 5000 PO Take 5,000 mcg by mouth daily.   Yes [provider]  butalbital-acetaminophen-caffeine (FIORICET, ESGIC) 50-325-40 MG tablet TAKE 1 TABLET BY MOUTH EVERY 6 HOURS AS NEEDED FOR HEADACHE 02/23/17  Yes Nelwyn SalisburyFry, Stephen A, MD  clopidogrel (PLAVIX) 75 MG tablet Take 1 tablet (75 mg total) by mouth every evening. 06/18/14  Yes Gordy SaversKwiatkowski, Peter F, MD  diclofenac (VOLTAREN) 75 MG EC tablet TAKE 1 TABLET (75 MG TOTAL) BY MOUTH 2 (TWO) TIMES DAILY. 02/06/17  Yes Gordy SaversKwiatkowski, Peter F, MD  levothyroxine (SYNTHROID, LEVOTHROID) 125 MCG tablet TAKE 1 TABLET BY MOUTH DAILY BEFORE BREAKFAST. 02/15/17  Yes Gordy SaversKwiatkowski, Peter F, MD  lisinopril-hydrochlorothiazide (PRINZIDE,ZESTORETIC) 20-12.5 MG tablet TAKE 1 TABLET BY MOUTH DAILY. 01/09/17  Yes Gordy SaversKwiatkowski, Peter F, MD  tetrahydrozoline 0.05 % ophthalmic solution Place 2 drops into  both eyes as needed (for red eyes). Reported on 02/12/2016   Yes [provider]  traMADol (ULTRAM) 50 MG tablet TAKE 1 TABLET BY MOUTH EVERY 6 HOURS AS NEEDED FOR PAIN 02/16/17  Yes Gordy SaversKwiatkowski, Peter F, MD  verapamil (VERELAN PM) 240 MG 24 hr capsule Take 1 capsule (240 mg total) by mouth at bedtime. 01/02/17  Yes Lewayne Buntingrenshaw, Brian S, MD  clopidogrel (PLAVIX) 75 MG tablet TAKE 1 TABLET BY MOUTH ONCE A DAY Patient not taking: Reported on 02/23/2017 01/09/17   Gordy SaversKwiatkowski, Peter F, MD  morphine (MSIR) 15 MG tablet Take 1 tablet (15 mg total) by mouth every 4 (four) hours as needed for severe pain. Patient not taking: Reported on 02/23/2017 11/29/16   Glenna FellowsHoxworth, Benjamin, MD   Liver Function Tests  Recent Labs Lab 02/23/17 1114  AST 33  ALT 51  ALKPHOS 114  BILITOT 0.6  PROT 7.5  ALBUMIN 3.8   No results for input(s): LIPASE, AMYLASE in the last 168 hours. CBC  Recent Labs Lab 02/23/17 1114 02/23/17 1131  WBC 7.0  --   NEUTROABS 3.9  --   HGB 13.0 12.6  HCT 38.7 37.0  MCV 90.2  --   PLT 160  --    Basic Metabolic Panel  Recent Labs Lab 02/23/17 1114 02/23/17 1131  NA 140 141  K 3.9 3.9  CL 108 109  CO2 24  --   GLUCOSE 102* 98  BUN 16 19  CREATININE 0.73 0.70  CALCIUM 9.5  --      Vitals:   02/23/17 1930 02/23/17 1945 02/23/17 2000 02/23/17 2015  BP: (!) 148/83 140/83 (!) 145/82 (!) 154/83  Pulse: (!) 58 (!) 56 (!) 56 (!) 54  Resp: 14 (!) 21 (!) 21 19  Temp:      TempSrc:      SpO2: 100% 98% 96% 99%  Weight:      Height:       Exam: Gen obese pleasant AAF, no distress No rash, cyanosis or gangrene Sclera anicteric, throat clear  No jvd or bruits Chest clear bilat RRR no MRG Abd soft ntnd no mass or ascites +bs GU defer MS no joint effusions or deformity Ext no LE edema / no wounds or ulcers Neuro is alert, Ox 3, left leg 3/5, R leg 4+/5. Bilat UE 5/5, no sensory deficit in arms or legs,  R face dec'd sensation   Na 140  Cr 0.73  K 3.9  Alb 3.8   LFT's ok  Trop 0.01 Hb 12.6  plt 160 WBC 7k  EKG (independ reviewed) > sinus brady 55 bpm, no acute changes MRI Head > IMPRESSION: 1. Subcentimeter acute infarct of the anterolateral left thalamus, in close proximity to the posterior limb of the left internal capsule, in keeping with the reported right-sided numbness. No hemorrhage or mass effect. 2. Extensive white matter disease, progressed from the prior examination of 11/26/2015. While the findings may be secondary to chronic microvascular ischemia, the involvement of the corpus callosum and the perpendicular orientation of many of the lesions relative to the lateral ventricles raises the possibility of demyelinating disease. If the patient has never undergone CSF sampling, that should be considered. 3. Multifocal chronic microhemorrhage in the basal ganglia and brainstem, most suggestive of chronic hypertensive angiopathy. 4. Old lacunar infarcts of the pons and basal ganglia.     Assessment: 1. Acute L thalamic CVA - with R sided symptoms. Neuro has evaluated and made rec's. Cont plavix.  2. CNS white matter disease - by MRI, defer to neuro 3. Hx prior CVA 4. HTN - cont verapamil/ lisinopril / HCTZ 5. DDD 6. Chronic L leg weakness 7. Hx partial thyroidectomy - for Grave's disease, on synthroid 8. Hx chronic back pain - takes prn MSO4 IR, ultram, Voltaren, Fioricet, tylenol for pain   Plan - as above    Jahira Swiss D [facility] Pager [phone]   If 7PM-7AM, please contact night-coverage [email or web address] Password [facility] 02/23/2017, 10:49 PM

## 2017-02-23 NOTE — ED Notes (Signed)
Pt refused to put on gown at this time.  Wants the results of CT scan first to see if gown is necessary.

## 2017-02-24 ENCOUNTER — Inpatient Hospital Stay (HOSPITAL_COMMUNITY): Payer: Medicare Other

## 2017-02-24 ENCOUNTER — Encounter (HOSPITAL_COMMUNITY): Payer: Self-pay

## 2017-02-24 DIAGNOSIS — Q211 Atrial septal defect: Secondary | ICD-10-CM | POA: Diagnosis not present

## 2017-02-24 DIAGNOSIS — E890 Postprocedural hypothyroidism: Secondary | ICD-10-CM | POA: Diagnosis not present

## 2017-02-24 DIAGNOSIS — R200 Anesthesia of skin: Secondary | ICD-10-CM | POA: Diagnosis not present

## 2017-02-24 DIAGNOSIS — E039 Hypothyroidism, unspecified: Secondary | ICD-10-CM | POA: Diagnosis not present

## 2017-02-24 DIAGNOSIS — I10 Essential (primary) hypertension: Secondary | ICD-10-CM | POA: Diagnosis not present

## 2017-02-24 DIAGNOSIS — I639 Cerebral infarction, unspecified: Secondary | ICD-10-CM

## 2017-02-24 DIAGNOSIS — M549 Dorsalgia, unspecified: Secondary | ICD-10-CM | POA: Diagnosis not present

## 2017-02-24 DIAGNOSIS — Q2112 Patent foramen ovale: Secondary | ICD-10-CM

## 2017-02-24 DIAGNOSIS — G8929 Other chronic pain: Secondary | ICD-10-CM | POA: Diagnosis not present

## 2017-02-24 LAB — LIPID PANEL
CHOL/HDL RATIO: 2.2 ratio
Cholesterol: 161 mg/dL (ref 0–200)
HDL: 72 mg/dL (ref >40)
LDL Cholesterol: 72 mg/dL (ref 0–99)
Triglycerides: 84 mg/dL (ref <150)
VLDL: 17 mg/dL (ref 0–40)

## 2017-02-24 LAB — RAPID URINE DRUG SCREEN, HOSP PERFORMED
Amphetamines: NOT DETECTED
BENZODIAZEPINES: NOT DETECTED
Barbiturates: POSITIVE — AB
COCAINE: NOT DETECTED
OPIATES: NOT DETECTED
Tetrahydrocannabinol: NOT DETECTED

## 2017-02-24 LAB — ECHOCARDIOGRAM COMPLETE
HEIGHTINCHES: 65 in
WEIGHTICAEL: 2878.33 [oz_av]

## 2017-02-24 MED ORDER — HYDROCHLOROTHIAZIDE 12.5 MG PO CAPS
12.5000 mg | ORAL_CAPSULE | Freq: Every day | ORAL | Status: DC
Start: 2017-02-24 — End: 2017-02-24
  Filled 2017-02-24: qty 1

## 2017-02-24 MED ORDER — ACETAMINOPHEN 325 MG PO TABS: 650.0000 mg | ORAL_TABLET | ORAL | Status: DC | PRN

## 2017-02-24 MED ORDER — SENNOSIDES-DOCUSATE SODIUM 8.6-50 MG PO TABS: 1.0000 | ORAL_TABLET | Freq: Every evening | ORAL | Status: DC | PRN

## 2017-02-24 MED ORDER — DICLOFENAC SODIUM 75 MG PO TBEC
75.0000 mg | DELAYED_RELEASE_TABLET | Freq: Two times a day (BID) | ORAL | Status: DC
  Administered 2017-02-24 (×2): 75 mg via ORAL
  Filled 2017-02-24 (×3): qty 1

## 2017-02-24 MED ORDER — ATORVASTATIN CALCIUM 40 MG PO TABS
40.0000 mg | ORAL_TABLET | Freq: Every day | ORAL | Status: DC
  Administered 2017-02-24: 40 mg via ORAL
  Filled 2017-02-24: qty 1

## 2017-02-24 MED ORDER — BIOTIN 5 MG PO CAPS: 5.0000 mg | ORAL_CAPSULE | Freq: Every day | ORAL | Status: DC

## 2017-02-24 MED ORDER — ASPIRIN 81 MG PO TBEC: 81.0000 mg | DELAYED_RELEASE_TABLET | Freq: Every day | ORAL | Status: DC

## 2017-02-24 MED ORDER — LEVOTHYROXINE SODIUM 25 MCG PO TABS
125.0000 ug | ORAL_TABLET | Freq: Every day | ORAL | Status: DC
  Administered 2017-02-24: 125 ug via ORAL
  Filled 2017-02-24: qty 1

## 2017-02-24 MED ORDER — LISINOPRIL-HYDROCHLOROTHIAZIDE 20-12.5 MG PO TABS: 1.0000 | ORAL_TABLET | Freq: Every day | ORAL | Status: DC

## 2017-02-24 MED ORDER — ENOXAPARIN SODIUM 40 MG/0.4ML ~~LOC~~ SOLN: 40.0000 mg | SUBCUTANEOUS | Status: DC

## 2017-02-24 MED ORDER — TRAMADOL HCL 50 MG PO TABS
50.0000 mg | ORAL_TABLET | Freq: Four times a day (QID) | ORAL | Status: DC | PRN
  Administered 2017-02-24 (×2): 50 mg via ORAL
  Filled 2017-02-24 (×2): qty 1

## 2017-02-24 MED ORDER — VERAPAMIL HCL ER 240 MG PO TBCR
240.0000 mg | EXTENDED_RELEASE_TABLET | Freq: Every day | ORAL | Status: DC
  Administered 2017-02-24: 240 mg via ORAL
  Filled 2017-02-24 (×2): qty 1

## 2017-02-24 MED ORDER — ACETAMINOPHEN 650 MG RE SUPP: 650.0000 mg | RECTAL | Status: DC | PRN

## 2017-02-24 MED ORDER — LISINOPRIL 20 MG PO TABS
20.0000 mg | ORAL_TABLET | Freq: Every day | ORAL | Status: DC
  Filled 2017-02-24: qty 1

## 2017-02-24 MED ORDER — ASPIRIN EC 81 MG PO TBEC
81.0000 mg | DELAYED_RELEASE_TABLET | Freq: Every day | ORAL | Status: DC
  Administered 2017-02-24: 81 mg via ORAL
  Filled 2017-02-24: qty 1

## 2017-02-24 MED ORDER — STROKE: EARLY STAGES OF RECOVERY BOOK
Freq: Once | Status: AC
Start: 2017-02-24 — End: 2017-02-24
  Administered 2017-02-24: 12:00:00
  Filled 2017-02-24: qty 1

## 2017-02-24 MED ORDER — PERFLUTREN LIPID MICROSPHERE
1.0000 mL | INTRAVENOUS | Status: AC | PRN
  Administered 2017-02-24: 2 mL via INTRAVENOUS
  Filled 2017-02-24: qty 10

## 2017-02-24 MED ORDER — ACETAMINOPHEN 160 MG/5ML PO SOLN: 650.0000 mg | ORAL | Status: DC | PRN

## 2017-02-24 MED ORDER — CLOPIDOGREL BISULFATE 75 MG PO TABS
75.0000 mg | ORAL_TABLET | Freq: Every evening | ORAL | Status: DC
  Administered 2017-02-24: 75 mg via ORAL
  Filled 2017-02-24: qty 1

## 2017-02-24 MED ORDER — BUTALBITAL-APAP-CAFFEINE 50-325-40 MG PO TABS
1.0000 | ORAL_TABLET | Freq: Four times a day (QID) | ORAL | Status: DC | PRN
  Administered 2017-02-24: 1 via ORAL
  Filled 2017-02-24: qty 1

## 2017-02-24 MED ORDER — ACETAMINOPHEN 500 MG PO TABS: 1000.0000 mg | ORAL_TABLET | Freq: Four times a day (QID) | ORAL | Status: DC | PRN

## 2017-02-24 NOTE — Progress Notes (Addendum)
STROKE TEAM PROGRESS NOTE   HISTORY OF PRESENT ILLNESS (per record) Abigail [name] is an 71 y.o. female who went to breakfast and then the store when she noted both hands (fingers were numb). She thought she was having allergic reaction.  Due to the numbness not resolving called her PCP. HA not changed since then, Numbness in on the palmar surface of the right palmer surface and right lip. No trouble with speaking, swallowing or weakness. She does have pain over her left eye. She does have HA at baseline. She takes Fioricet which helps and did take a dose this AM but it is wear off at this time. She was LKW 02/22/2017 At 08:00. Patient was not administered IV t-PA secondary to being out of the window. She was admitted for further evaluation and treatment.   SUBJECTIVE (INTERVAL HISTORY) No family is at bedside. Pt sitting in chair. She still has subjective feeling of right arm and leg tingling but no decreased sensation on exam. She had multiple lacunar infarcts in the past. She follows with Dr. Pearlean Brownie and she had new left thalamic small stroke again this time. She is compliant with plavix and lipitor.    OBJECTIVE Temp:  [97.2 F (36.2 C)-99.1 F (37.3 C)] 97.9 F (36.6 C) (06/08 0800) Pulse Rate:  [52-82] 56 (06/08 0800) Cardiac Rhythm: Sinus bradycardia (06/08 0700) Resp:  [14-23] 16 (06/08 0800) BP: (115-160)/(53-134) 118/53 (06/08 0800) SpO2:  [96 %-100 %] 100 % (06/08 0800) Weight:  [81.6 kg (179 lb 14.3 oz)] 81.6 kg (179 lb 14.3 oz) (06/08 0032)  CBC:  Recent Labs Lab 02/23/17 1114 02/23/17 1131  WBC 7.0  --   NEUTROABS 3.9  --   HGB 13.0 12.6  HCT 38.7 37.0  MCV 90.2  --   PLT 160  --     Basic Metabolic Panel:  Recent Labs Lab 02/23/17 1114 02/23/17 1131  NA 140 141  K 3.9 3.9  CL 108 109  CO2 24  --   GLUCOSE 102* 98  BUN 16 19  CREATININE 0.73 0.70  CALCIUM 9.5  --     Lipid Panel:    Component Value Date/Time   CHOL 161 02/24/2017 0518   CHOL CANCELED  11/17/2015 1245   TRIG 84 02/24/2017 0518   HDL 72 02/24/2017 0518   HDL CANCELED 11/17/2015 1245   CHOLHDL 2.2 02/24/2017 0518   VLDL 17 02/24/2017 0518   LDLCALC 72 02/24/2017 0518   LDLCALC 70 11/17/2015 1035   HgbA1c:  Lab Results  Component Value Date   HGBA1C 6.0 (H) 06/03/2015   Urine Drug Screen:    Component Value Date/Time   LABOPIA NONE DETECTED 02/24/2017 0900   COCAINSCRNUR NONE DETECTED 02/24/2017 0900   LABBENZ NONE DETECTED 02/24/2017 0900   AMPHETMU NONE DETECTED 02/24/2017 0900   THCU NONE DETECTED 02/24/2017 0900   LABBARB POSITIVE (A) 02/24/2017 0900    Alcohol Level No results found for: ETH  IMAGING I have personally reviewed the radiological images below and agree with the radiology interpretations.  Ct Head Wo Contrast 02/23/2017 Small vessel disease with prior small infarcts, unchanged. No acute infarct is demonstrated on this noncontrast CT examination. No intracranial mass, hemorrhage, or extra-axial fluid collection. Areas of arterial vascular calcification noted. There are areas of paranasal sinus disease.   Mr Brain Wo Contrast (neuro Protocol) 02/23/2017 1. Subcentimeter acute infarct of the anterolateral left thalamus, in close proximity to the posterior limb of the left internal capsule, in keeping  with the reported right-sided numbness. No hemorrhage or mass effect. 2. Extensive white matter disease, progressed from the prior examination of 11/26/2015. While the findings may be secondary to chronic microvascular ischemia, the involvement of the corpus callosum and the perpendicular orientation of many of the lesions relative to the lateral ventricles raises the possibility of demyelinating disease. If the patient has never undergone CSF sampling, that should be considered. 3. Multifocal chronic microhemorrhage in the basal ganglia and brainstem, most suggestive of chronic hypertensive angiopathy. 4. Old lacunar infarcts of the pons and basal ganglia.    Mr Maxine Glenn Head/brain Wo Cm 02/24/2017 Normal intracranial MRA. No large vessel occlusion. No high-grade or correctable stenosis. No significant atheromatous disease for patient age.   2D Echocardiogram  - Left ventricle: The cavity size was normal. There was moderate focal basal and mild concentric hypertrophy. Systolic function was normal. The estimated ejection fraction was in the range of 60% to 65%. Wall motion was normal; there were no regional wall motion abnormalities. - Aortic valve: Trileaflet; normal thickness, mildly calcified leaflets. - Atrial septum: There was increased thickness of the septum, consistent with lipomatous hypertrophy. - Pulmonary arteries: Systolic pressure could not be accurately estimated.  Carotid Doppler   There is 1-39% bilateral ICA stenosis. Vertebral artery flow is antegrade.     PHYSICAL EXAM  Temp:  [97.2 F (36.2 C)-99.3 F (37.4 C)] 98.7 F (37.1 C) (06/08 1726) Pulse Rate:  [52-82] 70 (06/08 1726) Resp:  [14-23] 18 (06/08 1412) BP: (99-160)/(53-134) 115/65 (06/08 1726) SpO2:  [96 %-100 %] 98 % (06/08 1726) Weight:  [179 lb 14.3 oz (81.6 kg)] 179 lb 14.3 oz (81.6 kg) (06/08 0032)  General - Well nourished, well developed, in no apparent distress.  Ophthalmologic - Sharp disc margins OU.   Cardiovascular - Regular rate and rhythm.  Mental Status -  Level of arousal and orientation to time, place, and person were intact. Language including expression, naming, repetition, comprehension was assessed and found intact. Attention span and concentration were normal. Fund of Knowledge was assessed and was intact.  Cranial Nerves II - XII - II - Visual field intact OU. III, IV, VI - Extraocular movements intact. V - Facial sensation intact bilaterally. VII - Facial movement intact bilaterally. VIII - Hearing & vestibular intact bilaterally. X - Palate elevates symmetrically. XI - Chin turning & shoulder shrug intact bilaterally. XII -  Tongue protrusion intact.  Motor Strength - The patient's strength was normal in all extremities and pronator drift was absent.  Bulk was normal and fasciculations were absent.   Motor Tone - Muscle tone was assessed at the neck and appendages and was normal.  Reflexes - The patient's reflexes were 1+ in all extremities and she had no pathological reflexes.  Sensory - Light touch, temperature/pinprick were assessed and were symmetrical. However, she has subjective tingling at RUE and RLE  Coordination - The patient had normal movements in the hands and feet with no ataxia or dysmetria.  Tremor was absent.  Gait and Station - deferred.   ASSESSMENT/PLAN Ms. TAYLYN BRAME is a 71 y.o. female with history of several CVA's in the past, HTN, DDD, diverticulitis presenting with numbness R arm and face. She did not receive IV t-PA due to being out of the window.   Stroke:   L thalamic infarct embolic secondary to small vessel disease source.  Resultant    CT head Small vessel disease with prior infarcts. No acute abnormality. Sinus dz  MRI head  L thalamic infarct. Small vessel disease.  ? Demyelinating dz. Chronic microhmg. Old pontine and BG infarct  MRA head Unremarkable   Carotid Doppler  B ICA 1-39% stenosis, VAs antegrade   2D Echo  EF 60-65%. No source of embolus   LDL 72  HgbA1c pending  Lovenox 40 mg sq daily for VTE prophylaxis  Diet regular Room service appropriate? Yes; Fluid consistency: Thin  clopidogrel 75 mg daily prior to admission, now on aspirin 81 mg daily and clopidogrel 75 mg daily. Recommend continue ASA and plavix for 3 weeks and then plavix as per CHANCE trial recommedations.  Patient counseled to be compliant with her antithrombotic medications  Ongoing aggressive stroke risk factor management  Therapy recommendations:  OP PT  Disposition:  Return home  Hx stroke/TIA - mostly consistent with small vessel disease  11/2015 - right CR infarct  04/2015 -  chronic R pontine and cerebellar infarcts d/t Small vessel disease.   04/2005 - L thalamic infarct, R cerebellar, R MCA/ACA infarct. DSA negative.  [facility]  PFO  Hx of PFO  The current stroke most likely small vessel etiology  No LE venous doppler necessary  Hypertension  Stable  Permissive hypertension (OK if < 220/120) but gradually normalize in 5-7 days  Long-term BP goal normotensive  Hyperlipidemia  Home meds:  lipitor 40, resumed in hospital  LDL 72, goal < 70  Continue statin at discharge  Other Stroke Risk Factors  Advanced age  Former Cigarette smoker  UDS positive for barbituates  ETOH use, advised to drink no more than 1 drink(s) a day  Family hx stroke (mother, maternal grandmother)  Obstructive sleep apnea, s/p surgical repair   Other Active Problems  DDD  Hx partial thyroidectomy - Grave's disease, on synthroid  CBP - on mult meds  Hospital day # 1  Neurology will sign off. Please call with questions. [facility]. Thanks for the consult.  Marvel PlanJindong Niklaus Mamaril, MD PhD Stroke Neurology 02/24/2017 5:33 PM   To contact Stroke Continuity provider, please refer to [email or web address]. After hours, contact General Neurology

## 2017-02-24 NOTE — Progress Notes (Signed)
PROGRESS NOTE    DER GAGLIANO  ZOX:[id number]  DOB: 1946/01/09  DOA: 02/23/2017 PCP: Gordy Savers, MD   Brief Admission Hx: Abigail [name] is a 71 y.o. female with history of several CVA's in the past, HTN, DDD, diverticulitis who presented with numbness in the R arm and R face, no speech problems, chronic gait issues from back pain.  CT head in ED showed nothing acute, however MRI showed "acute infarct of the anterolateral left thalamus, in close proximity to the posterior limb of the left internal capsule, in keeping with the reported right-sided numbness".  MDM/Assessment & Plan:   1. Acute L thalamic CVA - with R sided symptoms. Neuro has evaluated, stroke team following. Cont plavix/aspirin for now. 2. CNS white matter disease - by MRI, defer to neuro recommendations 3. Hx prior CVA - was on plavix, atorvastatin prior to admission 4. HTN - cont verapamil/ lisinopril / HCTZ 5. DDD -stable  6. Chronic L leg weakness - continue outpatient PT 7. Hx partial thyroidectomy - for Grave's disease, on synthroid 8. Hx chronic back pain - takes prn MSO4 IR, ultram, Voltaren, Fioricet, tylenol for pain  DVT prophylaxis: enoxaparin Code Status: FULL  Family Communication: none present Disposition Plan: Home    Consultants:  Neurology stroke team  Subjective: Pt says that she still has unchanged numbness in left UE, but improved lip numbness.  Objective: Vitals:   02/24/17 0606 02/24/17 0800 02/24/17 1248 02/24/17 1412  BP: 123/68 (!) 118/53 125/89 99/62  Pulse: (!) 55 (!) 56 62 62  Resp: 16 16 18 18   Temp: 99.1 F (37.3 C) 97.9 F (36.6 C) 99.3 F (37.4 C) 98.2 F (36.8 C)  TempSrc: Oral Oral Oral Oral  SpO2: 98% 100% 99% 97%  Weight:      Height:        Intake/Output Summary (Last 24 hours) at 02/24/17 1528 Last data filed at 02/24/17 1300  Gross per 24 hour  Intake              800 ml  Output                0 ml  Net              800 ml   Filed Weights   02/23/17 1114 02/24/17 0032  Weight: 85.3 kg (188 lb) 81.6 kg (179 lb 14.3 oz)     REVIEW OF SYSTEMS  As per history otherwise all reviewed and reported negative  Exam:  General exam: awake, alert, NAD, cooperative.  Respiratory system:  No increased work of breathing. Cardiovascular system: S1 & S2 heard, RRR. No JVD, murmurs, gallops, clicks or pedal edema. Gastrointestinal system: Abdomen is nondistended, soft and nontender. Normal bowel sounds heard. Central nervous system: Alert and oriented. No focal neurological deficits. Extremities: LUE sensory deficits  Data Reviewed: Basic Metabolic Panel:  Recent Labs Lab 02/23/17 1114 02/23/17 1131  NA 140 141  K 3.9 3.9  CL 108 109  CO2 24  --   GLUCOSE 102* 98  BUN 16 19  CREATININE 0.73 0.70  CALCIUM 9.5  --    Liver Function Tests:  Recent Labs Lab 02/23/17 1114  AST 33  ALT 51  ALKPHOS 114  BILITOT 0.6  PROT 7.5  ALBUMIN 3.8   No results for input(s): LIPASE, AMYLASE in the last 168 hours. No results for input(s): AMMONIA in the last 168 hours. CBC:  Recent Labs Lab 02/23/17 1114 02/23/17 1131  WBC 7.0  --   NEUTROABS 3.9  --   HGB 13.0 12.6  HCT 38.7 37.0  MCV 90.2  --   PLT 160  --    Cardiac Enzymes: No results for input(s): CKTOTAL, CKMB, CKMBINDEX, TROPONINI in the last 168 hours. CBG (last 3)  No results for input(s): GLUCAP in the last 72 hours. No results found for this or any previous visit (from the past 240 hour(s)).   Studies: Dg Chest 2 View  Result Date: 02/24/2017 CLINICAL DATA:  Stroke. EXAM: CHEST  2 VIEW COMPARISON:  10/17/2015 . FINDINGS: Mediastinum hilar structures normal. Heart size normal. No focal infiltrate. No pleural effusion or pneumothorax. No acute bony abnormality . IMPRESSION: No acute cardiopulmonary disease. Electronically Signed   By: Maisie Fushomas  Register   On: 02/24/2017 11:26   Ct Head Wo Contrast  Result Date: 02/23/2017 CLINICAL DATA:  Right hand and right  side of mouth numbness for 1 day EXAM: CT HEAD WITHOUT CONTRAST TECHNIQUE: Contiguous axial images were obtained from the base of the skull through the vertex without intravenous contrast. COMPARISON:  Brain MRI November 26, 2015 FINDINGS: Brain: The ventricles are normal in size and configuration. There is no intracranial mass, hemorrhage, extra-axial fluid collection, or midline shift. There is patchy small vessel disease in the centra semiovale bilaterally. There is a prior focal lacunar infarct mid right centrum semiovale, stable. There is decreased attenuation in the posterolateral right basal ganglia, a likely prior small infarct in this area. There is a prior small infarct in the posteromedial pons-midbrain junction region. No acute appearing infarct is appreciable by CT. Vascular: There is no appreciable hyperdense vessel. There is calcification in each carotid siphon region. Skull: The bony calvarium appears intact. Sinuses/Orbits: There is mucosal thickening in multiple ethmoid air cells bilaterally. There is slight mucosal thickening along the anterior left sphenoid sinus. Other visualized paranasal sinuses are clear. Orbits appear symmetric bilaterally. Other:  Visualized mastoid air cells are clear IMPRESSION: Small vessel disease with prior small infarcts, unchanged. No acute infarct is demonstrated on this noncontrast CT examination. No intracranial mass, hemorrhage, or extra-axial fluid collection. Areas of arterial vascular calcification noted. There are areas of paranasal sinus disease. Electronically Signed   By: Bretta BangWilliam  Woodruff III M.D.   On: 02/23/2017 11:54   Mr Brain Wo Contrast (neuro Protocol)  Result Date: 02/23/2017 CLINICAL DATA:  Right-sided facial and hand numbness EXAM: MRI HEAD WITHOUT CONTRAST TECHNIQUE: Multiplanar, multiecho pulse sequences of the brain and surrounding structures were obtained without intravenous contrast. COMPARISON:  Head CT 02/23/2017 Brain MRI 11/26/2015  FINDINGS: Brain: There are numerous (greater than 20) hyperintense T2 weighted signal lesions within the white matter bilaterally. Many of these lesions are oriented perpendicularly to the long axis of the lateral ventricles and there are multiple pericallosal lesions, including a lesion involving the anterior body of the corpus callosum. Number of lesions has progressed slightly from the prior examination. There is associated low T1 weighted signal at many of the sites. There is a subcentimeter focus of diffusion restriction within the anterolateral left thalamus, in close proximity to the posterior limb of the internal capsule. There is no midline shift or other mass effect. There is an old right pontine infarct. Old right basal ganglia lacunar infarct is also noted, new compared to 11/26/2015. There are multiple foci of chronic microhemorrhage involving both basal ganglia, the brainstem and the medial cerebellum. Brain volume is normal for age without age-advanced or lobar predominant atrophy. The dura is  normal and there is no extra-axial collection. Vascular: Major intracranial arterial and venous sinus flow voids are preserved. Skull and upper cervical spine: The visualized skull base, calvarium, upper cervical spine and extracranial soft tissues are normal. Sinuses/Orbits: No fluid levels or advanced mucosal thickening. No mastoid effusion. Normal orbits. IMPRESSION: 1. Subcentimeter acute infarct of the anterolateral left thalamus, in close proximity to the posterior limb of the left internal capsule, in keeping with the reported right-sided numbness. No hemorrhage or mass effect. 2. Extensive white matter disease, progressed from the prior examination of 11/26/2015. While the findings may be secondary to chronic microvascular ischemia, the involvement of the corpus callosum and the perpendicular orientation of many of the lesions relative to the lateral ventricles raises the possibility of demyelinating  disease. If the patient has never undergone CSF sampling, that should be considered. 3. Multifocal chronic microhemorrhage in the basal ganglia and brainstem, most suggestive of chronic hypertensive angiopathy. 4. Old lacunar infarcts of the pons and basal ganglia. Electronically Signed   By: Deatra Robinson M.D.   On: 02/23/2017 19:20   Mr Maxine Glenn Head/brain BJ Cm  Result Date: 02/24/2017 CLINICAL DATA:  Initial evaluation for acute stroke. EXAM: MRA HEAD WITHOUT CONTRAST TECHNIQUE: Angiographic images of the Circle of Willis were obtained using MRA technique without intravenous contrast. COMPARISON:  Prior MRI from 02/23/2017. FINDINGS: ANTERIOR CIRCULATION: Distal cervical segments of the internal carotid artery is are widely patent with antegrade flow. Petrous, cavernous, and supraclinoid segments patent without flow-limiting stenosis. ICA termini widely patent. A1 segments mildly tortuous but widely patent. Anterior cerebral arteries widely patent to their distal aspects. M1 segments widely patent without stenosis or occlusion. No proximal M2 occlusion. Distal MCA branches well opacified and symmetric. POSTERIOR CIRCULATION: Right vertebral artery dominant and widely patent to the vertebrobasilar junction. Left vertebral artery diffusely hypoplastic but patent as well without stenosis. Posterior inferior cerebral artery is patent proximally. Basilar artery mildly tortuous but widely patent to its distal aspect. Superior cerebral arteries patent bilaterally. Fetal type left PCA supplied via a widely patent left posterior communicating artery. Minimal outpouching at the basilar tip felt to be related to a tiny hypoplastic left P1 segment. Left PCA widely patent to its distal aspect. Right PCA largely supplied via the basilar artery, although a small right posterior communicating artery noted as well. Right PCA also widely patent to its distal aspect. No aneurysm or vascular malformation. IMPRESSION: Normal  intracranial MRA. No large vessel occlusion. No high-grade or correctable stenosis. No significant atheromatous disease for patient age. Electronically Signed   By: Rise Mu M.D.   On: 02/24/2017 05:33     Scheduled Meds: . aspirin EC  81 mg Oral Daily  . atorvastatin  40 mg Oral q1800  . clopidogrel  75 mg Oral QPM  . diclofenac  75 mg Oral BID WC  . enoxaparin (LOVENOX) injection  40 mg Subcutaneous Q24H  . hydrochlorothiazide  12.5 mg Oral Daily  . levothyroxine  125 mcg Oral QAC breakfast  . lisinopril  20 mg Oral Daily  . verapamil  240 mg Oral QHS   Continuous Infusions:  Principal Problem:   Acute CVA (cerebrovascular accident) (HCC) Active Problems:   Hypothyroidism   Essential hypertension   Right facial numbness   Chronic back pain   History of partial thyroidectomy   Hypothyroid   Time spent:   Standley Dakins, MD, FAAFP [facility] Pager [phone]  If 7PM-7AM, please contact night-coverage [email or web address] Password [email or web address] 02/24/2017, 3:28 PM  LOS: 1 day

## 2017-02-24 NOTE — Evaluation (Signed)
Physical Therapy Evaluation Patient Details Name: Abigail [name] MRN: [id number] DOB: 04/25/1946 Today's Date: 02/24/2017   History of Present Illness  Pt is a 71 yo female admitted through the ED with complaints of tingling of right face, mouth, and right arm and hand. Pt was diagnosed with a L Thalamic CVA. PMH significant for several CVAs, HTN, DDD, diverticulosis, and chronic L leg pain.   Clinical Impression  Pt presents with the above diagnosis and below deficits for therapy evaluation. Prior to admission, pt was living with her husband in a single level home and able to driver herself to MD appointments and to run errands. Pt required an occasional use of a [place] for stability while at home. Pt now requires Min guard for safety with gait without an AD and was instruction to use a [place] to improve safety with mobility. Pt will benefit form continued acute rehab services in order to address the below deficits prior to discharge home.     Follow Up Recommendations Outpatient PT    Equipment Recommendations  None recommended by PT    Recommendations for Other Services       Precautions / Restrictions Precautions Precautions: Fall Restrictions Weight Bearing Restrictions: No      Mobility  Bed Mobility Overal bed mobility: Modified Independent             General bed mobility comments: sitting up in recliner when PT arrives  Transfers Overall transfer level: Needs assistance Equipment used: Rolling walker (2 wheeled) Transfers: Sit to/from Stand Sit to Stand: Supervision         General transfer comment: supervision from recliner with use of hands to push up from arms of chair  Ambulation/Gait Ambulation/Gait assistance: Min guard Ambulation Distance (Feet): 100 Feet Assistive device: None Gait Pattern/deviations: Step-through pattern;Decreased step length - right;Decreased step length - left;Staggering right Gait velocity: decreased Gait velocity interpretation: Below  normal speed for age/gender General Gait Details: step through pattern with minimal sway toward right. Requires Min A to steady x 2 with gait  Stairs            Wheelchair Mobility    Modified Rankin (Stroke Patients Only)       Balance Overall balance assessment: Needs assistance Sitting-balance support: No upper extremity supported;Feet supported Sitting balance-Leahy Scale: Normal     Standing balance support: No upper extremity supported Standing balance-Leahy Scale: Fair       Tandem Stance - Right Leg: 15 Tandem Stance - Left Leg: 5                     Pertinent Vitals/Pain Pain Assessment: Faces Faces Pain Scale: Hurts little more Pain Location: lumbar region Pain Descriptors / Indicators: Aching;Grimacing Pain Intervention(s): Monitored during session;Repositioned    Home Living Family/patient expects to be discharged to:: Private residence Living Arrangements: Spouse/significant other Available Help at Discharge: Family;Available 24 hours/day Type of Home: House Home Access: Stairs to enter Entrance Stairs-Rails: None Entrance Stairs-Number of Steps: 3 Home Layout: Two level;Able to live on main level with bedroom/bathroom Home Equipment: Dan HumphreysWalker - 2 wheels;Cane - single point Additional Comments: drives    Prior Function Level of Independence: Independent         Comments: driving and husband can assist as he is retired     Higher education careers adviserHand Dominance   Dominant Hand: Right    Extremity/Trunk Assessment   Upper Extremity Assessment Upper Extremity Assessment: Defer to OT evaluation    Lower Extremity Assessment  Lower Extremity Assessment: LLE deficits/detail LLE Deficits / Details: weakness noted in left knee and hip. At least 3/5 grossly    Cervical / Trunk Assessment Cervical / Trunk Assessment: Normal  Communication   Communication: No difficulties  Cognition Arousal/Alertness: Awake/alert Behavior During Therapy: WFL for tasks  assessed/performed Overall Cognitive Status: Within Functional Limits for tasks assessed                                        General Comments      Exercises     Assessment/Plan    PT Assessment Patient needs continued PT services  PT Problem List Decreased activity tolerance;Decreased strength;Decreased balance;Decreased mobility;Decreased knowledge of use of DME;Pain       PT Treatment Interventions DME instruction;Gait training;Stair training;Functional mobility training;Therapeutic activities;Therapeutic exercise;Balance training    PT Goals (Current goals can be found in the Care Plan section)  Acute Rehab PT Goals Patient Stated Goal: to get back home PT Goal Formulation: With patient Time For Goal Achievement: 03/03/17 Potential to Achieve Goals: Good    Frequency Min 4X/week   Barriers to discharge        Co-evaluation               AM-PAC PT "6 Clicks" Daily Activity  Outcome Measure Difficulty turning over in bed (including adjusting bedclothes, sheets and blankets)?: None Difficulty moving from lying on back to sitting on the side of the bed? : None Difficulty sitting down on and standing up from a chair with arms (e.g., wheelchair, bedside commode, etc,.)?: Total Help needed moving to and from a bed to chair (including a wheelchair)?: A Little Help needed walking in hospital room?: A Little Help needed climbing 3-5 steps with a railing? : A Little 6 Click Score: 18    End of Session Equipment Utilized During Treatment: Gait belt Activity Tolerance: Patient tolerated treatment well Patient left: in chair;with call bell/phone within reach;with family/visitor present;with chair alarm set Nurse Communication: Mobility status PT Visit Diagnosis: Unsteadiness on feet (R26.81);Other abnormalities of gait and mobility (R26.89)    Time: 6962-9528 PT Time Calculation (min) (ACUTE ONLY): 19 min   Charges:   PT Evaluation $PT Eval  Moderate Complexity: 1 Procedure     PT G Codes:   PT G-Codes **NOT FOR INPATIENT CLASS** Functional Assessment Tool Used: AM-PAC 6 Clicks Basic Mobility;Clinical judgement Functional Limitation: Mobility: Walking and moving around Mobility: Walking and Moving Around Current Status (U1324): At least 40 percent but less than 60 percent impaired, limited or restricted Mobility: Walking and Moving Around Goal Status [phone]): At least 1 percent but less than 20 percent impaired, limited or restricted    Colin Broach PT, DPT  [phone]   Roxy Manns 02/24/2017, 3:23 PM

## 2017-02-24 NOTE — Progress Notes (Signed)
Pt discharge education and instructions completed with pt and spouse at bedside; both voices understanding and denies any questions. Pt IV and telemetry removed; pt discharge home with spouse to transport him home. Pt transported off unit via wheelchair with spouse and belongings to the side. Dionne BucyP. Amo Camary Sosa RN

## 2017-02-24 NOTE — Progress Notes (Signed)
Patient came via stretcher in company of transporter. Report received from ED nurse Jonny RuizJohn (RN). On admission patient was able to move self from bed to stretcher with one person assist. Skin assessment done by this writer, no pressure ulcer noted will continue monitor.

## 2017-02-24 NOTE — Progress Notes (Signed)
  Echocardiogram 2D Echocardiogram has been performed.  Abigail [name] 02/24/2017, 10:16 AM

## 2017-02-24 NOTE — Discharge Instructions (Signed)
Seek medical care or return if symptoms worsen or new problem develops.   Take aspirin for 3 weeks and then stop and continue to take the plavix without taking aspirin.

## 2017-02-24 NOTE — Discharge Summary (Signed)
Physician Discharge Summary  Abigail [name]:[id number] DOB: 1946/02/16 DOA: 02/23/2017  PCP: Gordy Savers, MD  Admit date: 02/23/2017 Discharge date: 02/24/2017  Admitted From: Home  Disposition: HOME Recommendations for Outpatient Follow-up:  1. Follow up with PCP in 1 weeks 2. Follow up with neurology Dr. Pearlean Brownie in 6 weeks 3. Please obtain BMP/CBC in one week  Discharge Condition: STABLE CODE STATUS:  FULL   Brief/Interim Summary: HPI: Abigail [name] is a 71 y.o. female with history of several CVA's in the past, HTN, DDD, diverticulitis who presented with numbness in the R arm and R face, no speech problems, chronic gait issues from back pain.  CT head in ED showed nothing acute, however MRI showed "acute infarct of the anterolateral left thalamus, in close proximity to the posterior limb of the left internal capsule, in keeping with the reported right-sided numbness". Asked to see for new CVA admission.   Patient is feeling better, R side of face is still numb but R arm numbness has improved.  Has chronic L leg weakness.  Is hungry and wants to get to her room.  Gives a vague history.    Denies any CP, SOB, abd pain, n/v/d, urinary complaints.   MRI also showed >>  "Extensive white matter disease, progressed from the prior examination of 11/26/2015. While the findings may be secondary to chronic microvascular ischemia, the involvement of the corpus callosum and the perpendicular orientation of many of the lesions relative to the lateral ventricles raises the possibility of demyelinating disease. If the patient has never undergone CSF sampling, that should be considered.  Ct Head Wo Contrast 02/23/2017 Small vessel disease with prior small infarcts, unchanged. No acute infarct is demonstrated on this noncontrast CT examination. No intracranial mass, hemorrhage, or extra-axial fluid collection. Areas of arterial vascular calcification noted. There are areas of paranasal sinus disease.    Mr Brain Wo Contrast (neuro Protocol) 02/23/2017 1. Subcentimeter acute infarct of the anterolateral left thalamus, in close proximity to the posterior limb of the left internal capsule, in keeping with the reported right-sided numbness. No hemorrhage or mass effect. 2. Extensive white matter disease, progressed from the prior examination of 11/26/2015. While the findings may be secondary to chronic microvascular ischemia, the involvement of the corpus callosum and the perpendicular orientation of many of the lesions relative to the lateral ventricles raises the possibility of demyelinating disease. If the patient has never undergone CSF sampling, that should be considered. 3. Multifocal chronic microhemorrhage in the basal ganglia and brainstem, most suggestive of chronic hypertensive angiopathy. 4. Old lacunar infarcts of the pons and basal ganglia.   Mr Maxine Glenn Head/brain Wo Cm 02/24/2017 Normal intracranial MRA. No large vessel occlusion. No high-grade or correctable stenosis. No significant atheromatous disease for patient age.   2D Echocardiogram  - Left ventricle: The cavity size was normal. There was moderatefocal basal and mild concentric hypertrophy. Systolic functionwas normal. The estimated ejection fraction was in the range of60% to 65%. Wall motion was normal; there were no regional wallmotion abnormalities. - Aortic valve: Trileaflet; normal thickness, mildly calcifiedleaflets. - Atrial septum: There was increased thickness of the septum,consistent with lipomatous hypertrophy. - Pulmonary arteries: Systolic pressure could not be accuratelyestimated.  Carotid Doppler   There is 1-39% bilateral ICA stenosis. Vertebral artery flow is antegrade.    1. Acute L thalamic CVA - with R sided symptoms. Neuro has evaluated, stroke team following. Cont plavix/aspirin for 3 weeks then plavix daily. Follow up with Dr. Pearlean Brownie.  2. CNS white matter disease - by MRI, neuro recommends to follow  up with Dr. Pearlean Brownie 3. Hx prior CVA - was on plavix, atorvastatin prior to admission, now on DAPT.  4. HTN - cont verapamil/ lisinopril / HCTZ 5. DDD -stable  6. Chronic L leg weakness - continue outpatient PT 7. Hx partial thyroidectomy - for Grave's disease, on synthroid 8. Hx chronic back pain - takes prn MSO4 IR, ultram, Voltaren, Fioricet, tylenol for pain  DVT prophylaxis: enoxaparin Code Status: FULL  Family Communication: none present Disposition Plan: Home   Discharge Diagnoses:  Principal Problem:   Acute CVA (cerebrovascular accident) (HCC) Active Problems:   Hypothyroidism   Essential hypertension   Right facial numbness   Chronic back pain   History of partial thyroidectomy   Hypothyroid   PFO (patent foramen ovale)  Discharge Instructions  Discharge Instructions    Ambulatory referral to Neurology    Complete by:  As directed    Pt will follow up with Dr. Pearlean Brownie at [facility] in about 2 months. Thanks.   Increase activity slowly    Complete by:  As directed      Allergies as of 02/24/2017      Reactions   Dilaudid [hydromorphone Hcl] Shortness Of Breath, Nausea And Vomiting, Other (See Comments)   Able to take morphine without issue   Shellfish Allergy Shortness Of Breath, Rash   Only shrimp allergy   Aggrenox [aspirin-dipyridamole Er] Nausea And Vomiting, Other (See Comments)   Headache   Cephalexin Itching, Rash   Codeine Phosphate Nausea And Vomiting   Contrast Media [iodinated Diagnostic Agents] Nausea And Vomiting   Hydrocodone Nausea And Vomiting, Other (See Comments)   Can take morphine without issue   Meperidine Hcl Nausea And Vomiting   Percocet [oxycodone-acetaminophen] Nausea And Vomiting, Other (See Comments)   Can take morphine without issue      Medication List    TAKE these medications   acetaminophen 500 MG tablet Commonly known as:  TYLENOL Take 1,000 mg by mouth every 6 (six) hours as needed for mild pain.   aspirin 81 MG EC  tablet Take 1 tablet (81 mg total) by mouth daily. Start taking on:  02/25/2017   atorvastatin 40 MG tablet Commonly known as:  LIPITOR TAKE 1 TABLET BY MOUTH EVERY DAY What changed:  See the new instructions.   BIOTIN 5000 PO Take 5,000 mcg by mouth daily.   butalbital-acetaminophen-caffeine 50-325-40 MG tablet Commonly known as:  FIORICET, ESGIC TAKE 1 TABLET BY MOUTH EVERY 6 HOURS AS NEEDED FOR HEADACHE   clopidogrel 75 MG tablet Commonly known as:  PLAVIX Take 1 tablet (75 mg total) by mouth every evening.   diclofenac 75 MG EC tablet Commonly known as:  VOLTAREN TAKE 1 TABLET (75 MG TOTAL) BY MOUTH 2 (TWO) TIMES DAILY.   levothyroxine 125 MCG tablet Commonly known as:  SYNTHROID, LEVOTHROID TAKE 1 TABLET BY MOUTH DAILY BEFORE BREAKFAST.   lisinopril-hydrochlorothiazide 20-12.5 MG tablet Commonly known as:  PRINZIDE,ZESTORETIC TAKE 1 TABLET BY MOUTH DAILY.   tetrahydrozoline 0.05 % ophthalmic solution Place 2 drops into both eyes as needed (for red eyes). Reported on 02/12/2016   traMADol 50 MG tablet Commonly known as:  ULTRAM TAKE 1 TABLET BY MOUTH EVERY 6 HOURS AS NEEDED FOR PAIN   verapamil 240 MG 24 hr capsule Commonly known as:  VERELAN PM Take 1 capsule (240 mg total) by mouth at bedtime.      Follow-up Information  Gordy SaversKwiatkowski, Peter F, MD. Schedule an appointment as soon as possible for a visit in 2 week(s).   Specialty:  Internal Medicine Why:  Hospital follow up Contact information: [address] [place] [address] [phone]        Micki RileySethi, Pramod S, MD. Schedule an appointment as soon as possible for a visit in 6 week(s).   Specialties:  Neurology, Radiology Contact information: [address] [place] [address] [phone]          Allergies  Allergen Reactions  . Dilaudid [Hydromorphone Hcl] Shortness Of Breath, Nausea And Vomiting and Other (See Comments)    Able to take morphine without issue  .  Shellfish Allergy Shortness Of Breath and Rash    Only shrimp allergy  . Aggrenox [Aspirin-Dipyridamole Er] Nausea And Vomiting and Other (See Comments)    Headache   . Cephalexin Itching and Rash  . Codeine Phosphate Nausea And Vomiting  . Contrast Media [Iodinated Diagnostic Agents] Nausea And Vomiting  . Hydrocodone Nausea And Vomiting and Other (See Comments)    Can take morphine without issue  . Meperidine Hcl Nausea And Vomiting  . Percocet [Oxycodone-Acetaminophen] Nausea And Vomiting and Other (See Comments)    Can take morphine without issue     Procedures/Studies: Dg Chest 2 View  Result Date: 02/24/2017 CLINICAL DATA:  Stroke. EXAM: CHEST  2 VIEW COMPARISON:  10/17/2015 . FINDINGS: Mediastinum hilar structures normal. Heart size normal. No focal infiltrate. No pleural effusion or pneumothorax. No acute bony abnormality . IMPRESSION: No acute cardiopulmonary disease. Electronically Signed   By: Maisie Fushomas  Register   On: 02/24/2017 11:26   Ct Head Wo Contrast  Result Date: 02/23/2017 CLINICAL DATA:  Right hand and right side of mouth numbness for 1 day EXAM: CT HEAD WITHOUT CONTRAST TECHNIQUE: Contiguous axial images were obtained from the base of the skull through the vertex without intravenous contrast. COMPARISON:  Brain MRI November 26, 2015 FINDINGS: Brain: The ventricles are normal in size and configuration. There is no intracranial mass, hemorrhage, extra-axial fluid collection, or midline shift. There is patchy small vessel disease in the centra semiovale bilaterally. There is a prior focal lacunar infarct mid right centrum semiovale, stable. There is decreased attenuation in the posterolateral right basal ganglia, a likely prior small infarct in this area. There is a prior small infarct in the posteromedial pons-midbrain junction region. No acute appearing infarct is appreciable by CT. Vascular: There is no appreciable hyperdense vessel. There is calcification in each carotid siphon  region. Skull: The bony calvarium appears intact. Sinuses/Orbits: There is mucosal thickening in multiple ethmoid air cells bilaterally. There is slight mucosal thickening along the anterior left sphenoid sinus. Other visualized paranasal sinuses are clear. Orbits appear symmetric bilaterally. Other:  Visualized mastoid air cells are clear IMPRESSION: Small vessel disease with prior small infarcts, unchanged. No acute infarct is demonstrated on this noncontrast CT examination. No intracranial mass, hemorrhage, or extra-axial fluid collection. Areas of arterial vascular calcification noted. There are areas of paranasal sinus disease. Electronically Signed   By: Bretta BangWilliam  Woodruff III M.D.   On: 02/23/2017 11:54   Mr Brain Wo Contrast (neuro Protocol)  Result Date: 02/23/2017 CLINICAL DATA:  Right-sided facial and hand numbness EXAM: MRI HEAD WITHOUT CONTRAST TECHNIQUE: Multiplanar, multiecho pulse sequences of the brain and surrounding structures were obtained without intravenous contrast. COMPARISON:  Head CT 02/23/2017 Brain MRI 11/26/2015 FINDINGS: Brain: There are numerous (greater than 20) hyperintense T2 weighted signal lesions within the white  matter bilaterally. Many of these lesions are oriented perpendicularly to the long axis of the lateral ventricles and there are multiple pericallosal lesions, including a lesion involving the anterior body of the corpus callosum. Number of lesions has progressed slightly from the prior examination. There is associated low T1 weighted signal at many of the sites. There is a subcentimeter focus of diffusion restriction within the anterolateral left thalamus, in close proximity to the posterior limb of the internal capsule. There is no midline shift or other mass effect. There is an old right pontine infarct. Old right basal ganglia lacunar infarct is also noted, new compared to 11/26/2015. There are multiple foci of chronic microhemorrhage involving both basal ganglia,  the brainstem and the medial cerebellum. Brain volume is normal for age without age-advanced or lobar predominant atrophy. The dura is normal and there is no extra-axial collection. Vascular: Major intracranial arterial and venous sinus flow voids are preserved. Skull and upper cervical spine: The visualized skull base, calvarium, upper cervical spine and extracranial soft tissues are normal. Sinuses/Orbits: No fluid levels or advanced mucosal thickening. No mastoid effusion. Normal orbits. IMPRESSION: 1. Subcentimeter acute infarct of the anterolateral left thalamus, in close proximity to the posterior limb of the left internal capsule, in keeping with the reported right-sided numbness. No hemorrhage or mass effect. 2. Extensive white matter disease, progressed from the prior examination of 11/26/2015. While the findings may be secondary to chronic microvascular ischemia, the involvement of the corpus callosum and the perpendicular orientation of many of the lesions relative to the lateral ventricles raises the possibility of demyelinating disease. If the patient has never undergone CSF sampling, that should be considered. 3. Multifocal chronic microhemorrhage in the basal ganglia and brainstem, most suggestive of chronic hypertensive angiopathy. 4. Old lacunar infarcts of the pons and basal ganglia. Electronically Signed   By: Deatra Robinson M.D.   On: 02/23/2017 19:20   Mr Maxine Glenn Head/brain ZO Cm  Result Date: 02/24/2017 CLINICAL DATA:  Initial evaluation for acute stroke. EXAM: MRA HEAD WITHOUT CONTRAST TECHNIQUE: Angiographic images of the Circle of Willis were obtained using MRA technique without intravenous contrast. COMPARISON:  Prior MRI from 02/23/2017. FINDINGS: ANTERIOR CIRCULATION: Distal cervical segments of the internal carotid artery is are widely patent with antegrade flow. Petrous, cavernous, and supraclinoid segments patent without flow-limiting stenosis. ICA termini widely patent. A1 segments  mildly tortuous but widely patent. Anterior cerebral arteries widely patent to their distal aspects. M1 segments widely patent without stenosis or occlusion. No proximal M2 occlusion. Distal MCA branches well opacified and symmetric. POSTERIOR CIRCULATION: Right vertebral artery dominant and widely patent to the vertebrobasilar junction. Left vertebral artery diffusely hypoplastic but patent as well without stenosis. Posterior inferior cerebral artery is patent proximally. Basilar artery mildly tortuous but widely patent to its distal aspect. Superior cerebral arteries patent bilaterally. Fetal type left PCA supplied via a widely patent left posterior communicating artery. Minimal outpouching at the basilar tip felt to be related to a tiny hypoplastic left P1 segment. Left PCA widely patent to its distal aspect. Right PCA largely supplied via the basilar artery, although a small right posterior communicating artery noted as well. Right PCA also widely patent to its distal aspect. No aneurysm or vascular malformation. IMPRESSION: Normal intracranial MRA. No large vessel occlusion. No high-grade or correctable stenosis. No significant atheromatous disease for patient age. Electronically Signed   By: Rise Mu M.D.   On: 02/24/2017 05:33      Subjective: Pt says that she  is having less numbness in lips, still having symptoms in RUE (numbness)  Discharge Exam: Vitals:   02/24/17 1412 02/24/17 1726  BP: 99/62 115/65  Pulse: 62 70  Resp: 18 18  Temp: 98.2 F (36.8 C) 98.7 F (37.1 C)   Vitals:   02/24/17 0800 02/24/17 1248 02/24/17 1412 02/24/17 1726  BP: (!) 118/53 125/89 99/62 115/65  Pulse: (!) 56 62 62 70  Resp: 16 18 18 18   Temp: 97.9 F (36.6 C) 99.3 F (37.4 C) 98.2 F (36.8 C) 98.7 F (37.1 C)  TempSrc: Oral Oral Oral Oral  SpO2: 100% 99% 97% 98%  Weight:      Height:       General exam: awake, alert, NAD, cooperative.  Respiratory system:  No increased work of  breathing. Cardiovascular system: S1 & S2 heard, RRR. No JVD, murmurs, gallops, clicks or pedal edema. Gastrointestinal system: Abdomen is nondistended, soft and nontender. Normal bowel sounds heard. Central nervous system: Alert and oriented. No focal neurological deficits. Extremities: RUE sensory deficits  The results of significant diagnostics from this hospitalization (including imaging, microbiology, ancillary and laboratory) are listed below for reference.     Microbiology: No results found for this or any previous visit (from the past 240 hour(s)).   Labs: BNP (last 3 results) No results for input(s): BNP in the last 8760 hours. Basic Metabolic Panel:  Recent Labs Lab 02/23/17 1114 02/23/17 1131  NA 140 141  K 3.9 3.9  CL 108 109  CO2 24  --   GLUCOSE 102* 98  BUN 16 19  CREATININE 0.73 0.70  CALCIUM 9.5  --    Liver Function Tests:  Recent Labs Lab 02/23/17 1114  AST 33  ALT 51  ALKPHOS 114  BILITOT 0.6  PROT 7.5  ALBUMIN 3.8   No results for input(s): LIPASE, AMYLASE in the last 168 hours. No results for input(s): AMMONIA in the last 168 hours. CBC:  Recent Labs Lab 02/23/17 1114 02/23/17 1131  WBC 7.0  --   NEUTROABS 3.9  --   HGB 13.0 12.6  HCT 38.7 37.0  MCV 90.2  --   PLT 160  --    Cardiac Enzymes: No results for input(s): CKTOTAL, CKMB, CKMBINDEX, TROPONINI in the last 168 hours. BNP: Invalid input(s): POCBNP CBG: No results for input(s): GLUCAP in the last 168 hours. D-Dimer No results for input(s): DDIMER in the last 72 hours. Hgb A1c No results for input(s): HGBA1C in the last 72 hours. Lipid Profile  Recent Labs  02/24/17 0518  CHOL 161  HDL 72  LDLCALC 72  TRIG 84  CHOLHDL 2.2   Thyroid function studies No results for input(s): TSH, T4TOTAL, T3FREE, THYROIDAB in the last 72 hours.  Invalid input(s): FREET3 Anemia work up No results for input(s): VITAMINB12, FOLATE, FERRITIN, TIBC, IRON, RETICCTPCT in the last 72  hours. Urinalysis    Component Value Date/Time   BILIRUBINUR n 12/28/2012 1105   PROTEINUR n 12/28/2012 1105   UROBILINOGEN 0.2 12/28/2012 1105   NITRITE n 12/28/2012 1105   LEUKOCYTESUR Trace 12/28/2012 1105   Sepsis Labs Invalid input(s): PROCALCITONIN,  WBC,  LACTICIDVEN Microbiology No results found for this or any previous visit (from the past 240 hour(s)).  Time coordinating discharge: 28 mins  SIGNED:  Standley Dakins, MD  [facility] 02/24/2017, 6:09 PM Pager [phone]  If 7PM-7AM, please contact night-coverage [email or web address] Password [email or web address]

## 2017-02-24 NOTE — Progress Notes (Signed)
Physical Therapy Brief Evaluation Note:  Pt was seen for assessment. SBA to Min guard for all mobility. Pt will benefit from continuing outpatient therapy at discharge. No equipment needed. Full evaluation to follow.   Colin BroachSabra M. Tenae Graziosi PT, DPT  [phone]

## 2017-02-24 NOTE — Progress Notes (Signed)
*  PRELIMINARY RESULTS* Vascular Ultrasound Carotid Duplex has been completed.  Preliminary findings: Bilateral: No significant (1-39%) ICA stenosis. Antegrade vertebral flow.    Farrel DemarkJill Eunice, RDMS, RVT  02/24/2017, 10:53 AM

## 2017-02-25 LAB — HEMOGLOBIN A1C
HEMOGLOBIN A1C: 6.1 % — AB (ref 4.8–5.6)
MEAN PLASMA GLUCOSE: 128 mg/dL

## 2017-02-27 LAB — VAS US CAROTID
LCCADSYS: -87 cm/s
LEFT ECA DIAS: -13 cm/s
LEFT VERTEBRAL DIAS: 10 cm/s
LICADSYS: -65 cm/s
Left CCA dist dias: -10 cm/s
Left CCA prox dias: 10 cm/s
Left CCA prox sys: 94 cm/s
Left ICA dist dias: -23 cm/s
Left ICA prox dias: -19 cm/s
Left ICA prox sys: -72 cm/s
RCCADSYS: -116 cm/s
RCCAPDIAS: -13 cm/s
RCCAPSYS: -98 cm/s
RIGHT ECA DIAS: -8 cm/s
RIGHT VERTEBRAL DIAS: 18 cm/s

## 2017-02-28 ENCOUNTER — Telehealth: Payer: Self-pay

## 2017-02-28 ENCOUNTER — Other Ambulatory Visit: Payer: Self-pay | Admitting: *Deleted

## 2017-02-28 NOTE — Telephone Encounter (Signed)
LMTCB

## 2017-02-28 NOTE — Telephone Encounter (Signed)
D/C 02/24/17 To: home  Spoke with pt and she states that she is weak but improving. She does still have some numbness and is scheduled to see Dr. Pearlean BrownieSethi very soon. She has no other complaints or concerns at this time. She does not feel she needs any home health assistance. She denies needing assistance with any ADL's.   Appt scheduled with Dr. Kirtland BouchardK 03/06/17, pt aware.    Transition Care Management Follow-up Telephone Call  How have you been since you were released from the hospital? Doing well, weak but improving   Do you understand why you were in the hospital? yes   Do you understand the discharge instrcutions? yes  Items Reviewed:  Medications reviewed: yes  Allergies reviewed: yes  Dietary changes reviewed: yes  Referrals reviewed: yes   Functional Questionnaire:   Activities of Daily Living (ADLs):   She states they are independent in the following: ambulation, bathing and hygiene, feeding, continence, grooming, toileting and dressing States they require assistance with the following: none   Any transportation issues/concerns?: no   Any patient concerns? no   Confirmed importance and date/time of follow-up visits scheduled: yes   Confirmed with patient if condition begins to worsen call PCP or go to the ER.  Patient was given the Call-a-Nurse line [phone]: yes

## 2017-03-06 ENCOUNTER — Encounter: Payer: Self-pay | Admitting: Internal Medicine

## 2017-03-06 ENCOUNTER — Ambulatory Visit (INDEPENDENT_AMBULATORY_CARE_PROVIDER_SITE_OTHER): Payer: Medicare Other | Admitting: Internal Medicine

## 2017-03-06 VITALS — BP 142/82 | HR 55 | Temp 98.1°F | Ht 65.0 in | Wt 186.6 lb

## 2017-03-06 DIAGNOSIS — Q211 Atrial septal defect: Secondary | ICD-10-CM | POA: Diagnosis not present

## 2017-03-06 DIAGNOSIS — E039 Hypothyroidism, unspecified: Secondary | ICD-10-CM

## 2017-03-06 DIAGNOSIS — R200 Anesthesia of skin: Secondary | ICD-10-CM

## 2017-03-06 DIAGNOSIS — I639 Cerebral infarction, unspecified: Secondary | ICD-10-CM

## 2017-03-06 DIAGNOSIS — I69898 Other sequelae of other cerebrovascular disease: Secondary | ICD-10-CM

## 2017-03-06 DIAGNOSIS — I10 Essential (primary) hypertension: Secondary | ICD-10-CM | POA: Diagnosis not present

## 2017-03-06 DIAGNOSIS — G467 Other lacunar syndromes: Secondary | ICD-10-CM

## 2017-03-06 DIAGNOSIS — G8190 Hemiplegia, unspecified affecting unspecified side: Secondary | ICD-10-CM

## 2017-03-06 DIAGNOSIS — Q2112 Patent foramen ovale: Secondary | ICD-10-CM

## 2017-03-06 NOTE — Patient Instructions (Signed)
Discontinue aspirin therapy in 2 weeks and continue Plavix  Limit your sodium (Salt) intake    It is important that you exercise regularly, at least 20 minutes 3 to 4 times per week.  If you develop chest pain or shortness of breath seek  medical attention.  Neurology follow-up as scheduled  Please check your blood pressure on a regular basis.  If it is consistently greater than 150/90, please make an office appointment.

## 2017-03-06 NOTE — Progress Notes (Signed)
Subjective:    Patient ID: Abigail [name], female    DOB: 1946-01-06, 71 y.o.   MRN: [id number]  HPI  Admit date: 02/23/2017 Discharge date: 02/24/2017  Recommendations for Outpatient Follow-up:  1. Follow up with PCP in 1 weeks 2. Follow up with neurology Dr. Pearlean Brownie in 6 weeks 3. Please obtain BMP/CBC in one week   Brief/Interim Summary: HPI: Abigail [name] a 71 y.o.femalewith history of several CVA's in the past, HTN, DDD, diverticulitis who presented with numbness in the R arm and R face, no speech problems, chronic gait issues from back pain. CT head in ED showed nothing acute, however MRI showed "acute infarct of the anterolateral left thalamus, in close proximity to the posterior limb of the left internal capsule, in keeping with the reported right-sided numbness".  MRI also showed >> "Extensive white matter disease, progressed from the prior examination of 11/26/2015. While the findings may be secondary to chronic microvascular ischemia, the involvement of the corpus callosum and the perpendicular orientation of many of the lesions relative to the lateral ventricles raises the possibility of demyelinating disease. If the patient has never undergone CSF sampling, that should be considered.    2D Echocardiogram  - Left ventricle: The cavity size was normal. There was moderatefocal basal and mild concentric hypertrophy. Systolic functionwas normal. The estimated ejection fraction was in the range of60% to 65%. Wall motion was normal; there were no regional wallmotion abnormalities. - Aortic valve: Trileaflet; normal thickness, mildly calcifiedleaflets. - Atrial septum: There was increased thickness of the septum,consistent with lipomatous hypertrophy. - Pulmonary arteries: Systolic pressure could not be accuratelyestimated.  Carotid Doppler  There is 1-39% bilateral ICA stenosis. Vertebral artery flow is antegrade.   1. Acute L thalamic CVA - with R sided symptoms.  Neuro has evaluated, stroke team following. Cont plavix/aspirin for 3 weeks then plavix daily. Follow up with Dr. Pearlean Brownie.  2. CNS white matter disease - by MRI, neuro recommends to follow up with Dr. Pearlean Brownie 3. Hx prior CVA - was on plavix, atorvastatin prior to admission, now on DAPT.  4. HTN - cont verapamil/ lisinopril / HCTZ 5. DDD -stable  6. Chronic L leg weakness - continue outpatient PT 7. Hx partial thyroidectomy - for Grave's disease, on synthroid Hx chronic back pain - takes prn MSO4 IR, ultram, Voltaren, Fioricet, tylenol for pain  71 year old patient who is seen today following a recent hospital discharge 10 days ago following a left thalamic lacunar stroke.  She presently remains on dual   antiplatelet drugs and after 2 weeks, will discontinue aspirin and continue Plavix.  Her only residual neurological symptom is some persistent right hand numbness.  She generally feels well.  She is scheduled for neurology follow-up  Hospital records reviewed  Past Medical History:  Diagnosis Date  . Bronchitis 09/2016  . Bruises easily   . DDD (degenerative disc disease)   . Degenerative joint disease of cervical spine 09-05-11   Cervival area, now some osteoarthritis-lower back and Rt. shoulder  . Depression   . Diverticulitis 09-05-11   hx. gastritis, diverticulitis x2 -now surgery planned  . DIVERTICULITIS, ACUTE 02/10/2010  . Essential hypertension 09-05-11   tx. Verapamil  . GROIN PAIN 09/21/2010  . Headache(784.0)    headaches are better  . Hearing loss   . Heart palpitations   . Hemorrhoids   . History of bronchitis   . History of colonic polyps   . History of pneumonia   . History of tension  headache   . Hyperlipidemia   . Hypertension   . Hypothyroidism 09-05-11   Supplement used  . Late effect of adverse effect of drug, medicinal or biological substance   . NECK PAIN, ACUTE 09/21/2010  . Nocturia   . Osteoarthritis 112-17-12   spine and rt. hip, rt. shoulder  . Patent  foramen ovale    Small - unable to be closed -   . Pneumonia   . PONV (postoperative nausea and vomiting)   . Sleep apnea 09-05-11   no cpap ever, had surgery to remove cartilage, no problems now  . SORE THROAT 04/30/2009  . Stroke (HCC) 09-05-11   2006/2009-(loss of memory, balance issues remains occ.)  . TIA 09/28/2007, 10/2014  . Unstable angina (HCC)    a. 05/2010 Cath: nl cors, EF 55%;  b. 05/2015 Lexiscan MV: small, severe, fixed apical defect and a small, severe, reversible inf lateral defect w/ apical thinning and mild ischemia, EF 54%.  Marland Kitchen URI 09/02/2008  . Vertigo      Social History   Social History  . Marital status: Married    Spouse name: N/A  . Number of children: N/A  . Years of education: N/A   Occupational History  . Not on file.   Social History Main Topics  . Smoking status: Former Smoker    Packs/day: 0.50    Years: 5.00    Types: Cigarettes    Quit date: 09/19/1968  . Smokeless tobacco: Never Used  . Alcohol use 1.2 oz/week    1 Cans of beer, 1 Shots of liquor per week     Comment: rarely  . Drug use: No  . Sexual activity: Yes   Other Topics Concern  . Not on file   Social History Narrative   Lives in [place] with her husband.  She does not routinely exercise.    Past Surgical History:  Procedure Laterality Date  . ABDOMINAL HYSTERECTOMY    . CARDIAC CATHETERIZATION N/A 06/01/2015   Procedure: Left Heart Cath and Coronary Angiography;  Surgeon: Kathleene Hazel, MD;  Location: [facility] INVASIVE CV LAB;  Service: Cardiovascular;  Laterality: N/A;  . COLON RESECTION  09/07/2011   Procedure: LAPAROSCOPIC SIGMOID COLON RESECTION;  Surgeon: Mariella Saa, MD;  Location: [facility];  Service: General;  Laterality: N/A;  with proctoscopy  . CYST EXCISION N/A 11/29/2016   Procedure: EXCISION UMBILICAL CYST;  Surgeon: Glenna Fellows, MD;  Location: [facility];  Service: General;  Laterality: N/A;  . ELBOW SURGERY  09-05-11   left elbow -ligament repair    . EYE SURGERY     cataract  . INSERTION OF MESH N/A 12/13/2013   Procedure: INSERTION OF MESH;  Surgeon: Mariella Saa, MD;  Location: [facility];  Service: General;  Laterality: N/A;  . LAPAROSCOPY  09/07/2011   Procedure: LAPAROSCOPY DIAGNOSTIC;  Surgeon: Roselle Locus II;  Location: [facility];  Service: Gynecology;  Laterality: N/A;  . MAXIMUM ACCESS (MAS)POSTERIOR LUMBAR INTERBODY FUSION (PLIF) 2 LEVEL N/A 05/03/2016   Procedure: L4-5 L5-S1 Maximum access posterior lumbar interbody fusion;  Surgeon: Maeola Harman, MD;  Location: [facility];  Service: Neurosurgery;  Laterality: N/A;  L4-5 L5-S1 Maximum access posterior lumbar interbody fusion  . SALPINGOOPHORECTOMY  09/07/2011   Procedure: SALPINGO OOPHERECTOMY;  Surgeon: Roselle Locus II;  Location: [facility];  Service: Gynecology;  Laterality: Right;  . THYROIDECTOMY, PARTIAL  09-05-11  . TUBAL LIGATION  1983  . UMBILICAL HERNIA REPAIR  yrs ago  .  VENTRAL HERNIA REPAIR  06/20/2012   Procedure: LAPAROSCOPIC VENTRAL HERNIA;  Surgeon: Mariella SaaBenjamin T Hoxworth, MD;  Location: [facility];  Service: General;  Laterality: N/A;  Laparoscopic Repair of Ventral Hernia with mesh  . VENTRAL HERNIA REPAIR N/A 12/13/2013   Procedure: LAPAROSCOPIC REPAIR RECURRENT VENTRAL INCISIONAL  HERNIA;  Surgeon: Mariella SaaBenjamin T Hoxworth, MD;  Location: [facility];  Service: General;  Laterality: N/A;    Family History  Problem Relation Age of Onset  . Arrhythmia Mother   . Hypertension Mother   . Stroke Mother   . Stroke Maternal Grandmother   . Diabetes Paternal Grandmother     Allergies  Allergen Reactions  . Dilaudid [Hydromorphone Hcl] Shortness Of Breath, Nausea And Vomiting and Other (See Comments)    Able to take morphine without issue  . Shellfish Allergy Shortness Of Breath and Rash    Only shrimp allergy  . Aggrenox [Aspirin-Dipyridamole Er] Nausea And Vomiting and Other (See Comments)    Headache   . Cephalexin Itching and Rash  . Codeine Phosphate Nausea  And Vomiting  . Contrast Media [Iodinated Diagnostic Agents] Nausea And Vomiting  . Hydrocodone Nausea And Vomiting and Other (See Comments)    Can take morphine without issue  . Meperidine Hcl Nausea And Vomiting  . Percocet [Oxycodone-Acetaminophen] Nausea And Vomiting and Other (See Comments)    Can take morphine without issue    Current Outpatient Prescriptions on File Prior to Visit  Medication Sig Dispense Refill  . aspirin EC 81 MG EC tablet Take 1 tablet (81 mg total) by mouth daily.    Marland Kitchen. atorvastatin (LIPITOR) 40 MG tablet TAKE 1 TABLET BY MOUTH EVERY DAY (Patient taking differently: TAKE 1 TABLET BY MOUTH EVERY DAY IN THE EVENING) 90 tablet 1  . BIOTIN 5000 PO Take 5,000 mcg by mouth daily.    . butalbital-acetaminophen-caffeine (FIORICET, ESGIC) 50-325-40 MG tablet TAKE 1 TABLET BY MOUTH EVERY 6 HOURS AS NEEDED FOR HEADACHE 60 tablet 0  . clopidogrel (PLAVIX) 75 MG tablet Take 1 tablet (75 mg total) by mouth every evening. 90 tablet 1  . diclofenac (VOLTAREN) 75 MG EC tablet TAKE 1 TABLET (75 MG TOTAL) BY MOUTH 2 (TWO) TIMES DAILY. 180 tablet 0  . levothyroxine (SYNTHROID, LEVOTHROID) 125 MCG tablet TAKE 1 TABLET BY MOUTH DAILY BEFORE BREAKFAST. 90 tablet 0  . lisinopril-hydrochlorothiazide (PRINZIDE,ZESTORETIC) 20-12.5 MG tablet TAKE 1 TABLET BY MOUTH DAILY. 90 tablet 1  . tetrahydrozoline 0.05 % ophthalmic solution Place 2 drops into both eyes as needed (for red eyes). Reported on 02/12/2016    . traMADol (ULTRAM) 50 MG tablet TAKE 1 TABLET BY MOUTH EVERY 6 HOURS AS NEEDED FOR PAIN 60 tablet 1  . verapamil (VERELAN PM) 240 MG 24 hr capsule Take 1 capsule (240 mg total) by mouth at bedtime. 90 capsule 3   No current facility-administered medications on file prior to visit.     BP (!) 142/82 (BP Location: Left Arm, Patient Position: Sitting, Cuff Size: Normal)   Pulse (!) 55   Temp 98.1 F (36.7 C) (Oral)   Ht 5\' 5"  (1.651 m)   Wt 186 lb 9.6 oz (84.6 kg)   SpO2 98%   BMI  31.05 kg/m    Review of Systems  HENT: Negative for congestion, dental problem, hearing loss, rhinorrhea, sinus pressure, sore throat and tinnitus.   Eyes: Negative for pain, discharge and visual disturbance.  Respiratory: Negative for cough and shortness of breath.   Cardiovascular: Negative for chest pain, palpitations  and leg swelling.  Gastrointestinal: Negative for abdominal distention, abdominal pain, blood in stool, constipation, diarrhea, nausea and vomiting.  Genitourinary: Negative for difficulty urinating, dysuria, flank pain, frequency, hematuria, pelvic pain, urgency, vaginal bleeding, vaginal discharge and vaginal pain.  Musculoskeletal: Negative for arthralgias, gait problem and joint swelling.  Skin: Negative for rash.  Neurological: Positive for weakness and numbness. Negative for dizziness, syncope, speech difficulty and headaches.  Hematological: Negative for adenopathy.  Psychiatric/Behavioral: Negative for agitation, behavioral problems and dysphoric mood. The patient is not nervous/anxious.        Objective:   Physical Exam  Constitutional: She is oriented to person, place, and time. She appears well-developed and well-nourished.  HENT:  Head: Normocephalic.  Right Ear: External ear normal.  Left Ear: External ear normal.  Mouth/Throat: Oropharynx is clear and moist.  Eyes: Conjunctivae and EOM are normal. Pupils are equal, round, and reactive to light.  Neck: Normal range of motion. Neck supple. No thyromegaly present.  Cardiovascular: Normal rate, regular rhythm, normal heart sounds and intact distal pulses.   Pulmonary/Chest: Effort normal and breath sounds normal.  Abdominal: Soft. Bowel sounds are normal. She exhibits no mass. There is no tenderness.  Musculoskeletal: Normal range of motion.  Lymphadenopathy:    She has no cervical adenopathy.  Neurological: She is alert and oriented to person, place, and time.  Mild decreased sensation right  hand  Cranial nerve examination negative.  Pupils equal and reactive.  Extraocular muscles full.  No facial asymmetry.  Facial sensation is normal Motor examination is negative.  No drift Sensory DSS normal Normal finger to nose testing Normal gait  Skin: Skin is warm and dry. No rash noted.  Psychiatric: She has a normal mood and affect. Her behavior is normal.          Assessment & Plan:   Status post left thalamic lacunar stroke.  Continue efforts at aggressive risk factor modification.  Initial blood pressure reading 142 over 82.  Follow-up blood pressure reading 118 over 78 Dyslipidemia.  Continue statin therapy Hypothyroidism Essential hypertension, controlled  Neurology follow-up as scheduled Patient will discontinue aspirin therapy in 2 weeks and continue Plavix Patient has been asked to report anynew neurological symptoms  Follow-up 3 months  Jessika Rothery Homero Fellers

## 2017-03-13 ENCOUNTER — Ambulatory Visit (INDEPENDENT_AMBULATORY_CARE_PROVIDER_SITE_OTHER)
Admission: RE | Admit: 2017-03-13 | Discharge: 2017-03-13 | Disposition: A | Discharge status: Home / Self Care | Payer: Medicare Other | Source: Ambulatory Visit | Attending: Internal Medicine | Admitting: Internal Medicine

## 2017-03-13 ENCOUNTER — Ambulatory Visit (INDEPENDENT_AMBULATORY_CARE_PROVIDER_SITE_OTHER): Payer: Medicare Other | Admitting: Internal Medicine

## 2017-03-13 ENCOUNTER — Encounter: Payer: Self-pay | Admitting: Internal Medicine

## 2017-03-13 VITALS — BP 102/62 | HR 64 | Temp 97.8°F | Ht 65.0 in | Wt 183.2 lb

## 2017-03-13 DIAGNOSIS — K5792 Diverticulitis of intestine, part unspecified, without perforation or abscess without bleeding: Secondary | ICD-10-CM | POA: Diagnosis not present

## 2017-03-13 DIAGNOSIS — I639 Cerebral infarction, unspecified: Secondary | ICD-10-CM | POA: Diagnosis not present

## 2017-03-13 DIAGNOSIS — I10 Essential (primary) hypertension: Secondary | ICD-10-CM | POA: Diagnosis not present

## 2017-03-13 DIAGNOSIS — I6381 Other cerebral infarction due to occlusion or stenosis of small artery: Secondary | ICD-10-CM

## 2017-03-13 DIAGNOSIS — K5732 Diverticulitis of large intestine without perforation or abscess without bleeding: Secondary | ICD-10-CM

## 2017-03-13 LAB — CBC WITH DIFFERENTIAL/PLATELET
BASOS PCT: 0.3 % (ref 0.0–3.0)
Basophils Absolute: 0 10*3/uL (ref 0.0–0.1)
Eosinophils Absolute: 0 10*3/uL (ref 0.0–0.7)
Eosinophils Relative: 0.5 % (ref 0.0–5.0)
HCT: 37 % (ref 36.0–46.0)
Hemoglobin: 12.6 g/dL (ref 12.0–15.0)
LYMPHS ABS: 2.1 10*3/uL (ref 0.7–4.0)
Lymphocytes Relative: 20 % (ref 12.0–46.0)
MCHC: 34.1 g/dL (ref 30.0–36.0)
MCV: 89.4 fl (ref 78.0–100.0)
MONO ABS: 1 10*3/uL (ref 0.1–1.0)
Monocytes Relative: 9.2 % (ref 3.0–12.0)
NEUTROS ABS: 7.4 10*3/uL (ref 1.4–7.7)
NEUTROS PCT: 70 % (ref 43.0–77.0)
Platelets: 161 10*3/uL (ref 150.0–400.0)
RBC: 4.14 Mil/uL (ref 3.87–5.11)
RDW: 12.9 % (ref 11.5–15.5)
WBC: 10.6 10*3/uL — ABNORMAL HIGH (ref 4.0–10.5)

## 2017-03-13 LAB — BASIC METABOLIC PANEL
BUN: 27 mg/dL — ABNORMAL HIGH (ref 6–23)
CALCIUM: 9.5 mg/dL (ref 8.4–10.5)
CO2: 25 mEq/L (ref 19–32)
Chloride: 102 mEq/L (ref 96–112)
Creatinine, Ser: 1.23 mg/dL — ABNORMAL HIGH (ref 0.40–1.20)
GFR: 55.21 mL/min — AB (ref >60.00)
GLUCOSE: 96 mg/dL (ref 70–99)
Potassium: 3.5 mEq/L (ref 3.5–5.1)
SODIUM: 138 meq/L (ref 135–145)

## 2017-03-13 MED ORDER — TRAMADOL HCL 50 MG PO TABS: 50.0000 mg | ORAL_TABLET | Freq: Four times a day (QID) | ORAL | 1 refills | Status: DC | PRN

## 2017-03-13 MED ORDER — METRONIDAZOLE 500 MG PO TABS: 500.0000 mg | ORAL_TABLET | Freq: Three times a day (TID) | ORAL | 0 refills | Status: DC

## 2017-03-13 MED ORDER — CIPROFLOXACIN HCL 500 MG PO TABS: 500.0000 mg | ORAL_TABLET | Freq: Two times a day (BID) | ORAL | 0 refills | Status: DC

## 2017-03-13 NOTE — Progress Notes (Signed)
Subjective:    Patient ID: Abigail [name], female    DOB: 10-17-1945, 71 y.o.   MRN: [id number]  HPI  71 year old patient with a prior history of partial laparoscopic colectomy due to diverticular disease. She was stable until 6 days ago when she had some diarrhea.  3 days ago had the onset of abdominal pain that intensified yesterday.  She had one episode of vomiting yesterday morning and has had some occasional chills.  Pain is aggravated by movement such as walking and coughing  Past Medical History:  Diagnosis Date  . Bronchitis 09/2016  . Bruises easily   . DDD (degenerative disc disease)   . Degenerative joint disease of cervical spine 09-05-11   Cervival area, now some osteoarthritis-lower back and Rt. shoulder  . Depression   . Diverticulitis 09-05-11   hx. gastritis, diverticulitis x2 -now surgery planned  . DIVERTICULITIS, ACUTE 02/10/2010  . Essential hypertension 09-05-11   tx. Verapamil  . GROIN PAIN 09/21/2010  . Headache(784.0)    headaches are better  . Hearing loss   . Heart palpitations   . Hemorrhoids   . History of bronchitis   . History of colonic polyps   . History of pneumonia   . History of tension headache   . Hyperlipidemia   . Hypertension   . Hypothyroidism 09-05-11   Supplement used  . Late effect of adverse effect of drug, medicinal or biological substance   . NECK PAIN, ACUTE 09/21/2010  . Nocturia   . Osteoarthritis 112-17-12   spine and rt. hip, rt. shoulder  . Patent foramen ovale    Small - unable to be closed -   . Pneumonia   . PONV (postoperative nausea and vomiting)   . Sleep apnea 09-05-11   no cpap ever, had surgery to remove cartilage, no problems now  . SORE THROAT 04/30/2009  . Stroke (HCC) 09-05-11   2006/2009-(loss of memory, balance issues remains occ.)  . TIA 09/28/2007, 10/2014  . Unstable angina (HCC)    a. 05/2010 Cath: nl cors, EF 55%;  b. 05/2015 Lexiscan MV: small, severe, fixed apical defect and a small, severe,  reversible inf lateral defect w/ apical thinning and mild ischemia, EF 54%.  Marland Kitchen URI 09/02/2008  . Vertigo      Social History   Social History  . Marital status: Married    Spouse name: N/A  . Number of children: N/A  . Years of education: N/A   Occupational History  . Not on file.   Social History Main Topics  . Smoking status: Former Smoker    Packs/day: 0.50    Years: 5.00    Types: Cigarettes    Quit date: 09/19/1968  . Smokeless tobacco: Never Used  . Alcohol use 1.2 oz/week    1 Cans of beer, 1 Shots of liquor per week     Comment: rarely  . Drug use: No  . Sexual activity: Yes   Other Topics Concern  . Not on file   Social History Narrative   Lives in [place] with her husband.  She does not routinely exercise.    Past Surgical History:  Procedure Laterality Date  . ABDOMINAL HYSTERECTOMY    . CARDIAC CATHETERIZATION N/A 06/01/2015   Procedure: Left Heart Cath and Coronary Angiography;  Surgeon: Kathleene Hazel, MD;  Location: [facility] INVASIVE CV LAB;  Service: Cardiovascular;  Laterality: N/A;  . COLON RESECTION  09/07/2011   Procedure: LAPAROSCOPIC SIGMOID COLON RESECTION;  Surgeon: Sharlet Salina  Lurlean Leyden Hoxworth, MD;  Location: [facility];  Service: General;  Laterality: N/A;  with proctoscopy  . CYST EXCISION N/A 11/29/2016   Procedure: EXCISION UMBILICAL CYST;  Surgeon: Glenna FellowsBenjamin Hoxworth, MD;  Location: [facility];  Service: General;  Laterality: N/A;  . ELBOW SURGERY  09-05-11   left elbow -ligament repair  . EYE SURGERY     cataract  . INSERTION OF MESH N/A 12/13/2013   Procedure: INSERTION OF MESH;  Surgeon: Mariella SaaBenjamin T Hoxworth, MD;  Location: [facility];  Service: General;  Laterality: N/A;  . LAPAROSCOPY  09/07/2011   Procedure: LAPAROSCOPY DIAGNOSTIC;  Surgeon: Roselle LocusJames E Tomblin II;  Location: [facility];  Service: Gynecology;  Laterality: N/A;  . MAXIMUM ACCESS (MAS)POSTERIOR LUMBAR INTERBODY FUSION (PLIF) 2 LEVEL N/A 05/03/2016   Procedure: L4-5 L5-S1 Maximum access posterior  lumbar interbody fusion;  Surgeon: Maeola HarmanJoseph Stern, MD;  Location: [facility];  Service: Neurosurgery;  Laterality: N/A;  L4-5 L5-S1 Maximum access posterior lumbar interbody fusion  . SALPINGOOPHORECTOMY  09/07/2011   Procedure: SALPINGO OOPHERECTOMY;  Surgeon: Roselle LocusJames E Tomblin II;  Location: [facility];  Service: Gynecology;  Laterality: Right;  . THYROIDECTOMY, PARTIAL  09-05-11  . TUBAL LIGATION  1983  . UMBILICAL HERNIA REPAIR  yrs ago  . VENTRAL HERNIA REPAIR  06/20/2012   Procedure: LAPAROSCOPIC VENTRAL HERNIA;  Surgeon: Mariella SaaBenjamin T Hoxworth, MD;  Location: [facility];  Service: General;  Laterality: N/A;  Laparoscopic Repair of Ventral Hernia with mesh  . VENTRAL HERNIA REPAIR N/A 12/13/2013   Procedure: LAPAROSCOPIC REPAIR RECURRENT VENTRAL INCISIONAL  HERNIA;  Surgeon: Mariella SaaBenjamin T Hoxworth, MD;  Location: [facility];  Service: General;  Laterality: N/A;    Family History  Problem Relation Age of Onset  . Arrhythmia Mother   . Hypertension Mother   . Stroke Mother   . Stroke Maternal Grandmother   . Diabetes Paternal Grandmother     Allergies  Allergen Reactions  . Dilaudid [Hydromorphone Hcl] Shortness Of Breath, Nausea And Vomiting and Other (See Comments)    Able to take morphine without issue  . Shellfish Allergy Shortness Of Breath and Rash    Only shrimp allergy  . Aggrenox [Aspirin-Dipyridamole Er] Nausea And Vomiting and Other (See Comments)    Headache   . Cephalexin Itching and Rash  . Codeine Phosphate Nausea And Vomiting  . Contrast Media [Iodinated Diagnostic Agents] Nausea And Vomiting  . Hydrocodone Nausea And Vomiting and Other (See Comments)    Can take morphine without issue  . Meperidine Hcl Nausea And Vomiting  . Percocet [Oxycodone-Acetaminophen] Nausea And Vomiting and Other (See Comments)    Can take morphine without issue    Current Outpatient Prescriptions on File Prior to Visit  Medication Sig Dispense Refill  . aspirin EC 81 MG EC tablet Take 1 tablet (81  mg total) by mouth daily.    Marland Kitchen. atorvastatin (LIPITOR) 40 MG tablet TAKE 1 TABLET BY MOUTH EVERY DAY (Patient taking differently: TAKE 1 TABLET BY MOUTH EVERY DAY IN THE EVENING) 90 tablet 1  . BIOTIN 5000 PO Take 5,000 mcg by mouth daily.    . butalbital-acetaminophen-caffeine (FIORICET, ESGIC) 50-325-40 MG tablet TAKE 1 TABLET BY MOUTH EVERY 6 HOURS AS NEEDED FOR HEADACHE 60 tablet 0  . clopidogrel (PLAVIX) 75 MG tablet Take 1 tablet (75 mg total) by mouth every evening. 90 tablet 1  . diclofenac (VOLTAREN) 75 MG EC tablet TAKE 1 TABLET (75 MG TOTAL) BY MOUTH 2 (TWO) TIMES DAILY. 180 tablet 0  .  levothyroxine (SYNTHROID, LEVOTHROID) 125 MCG tablet TAKE 1 TABLET BY MOUTH DAILY BEFORE BREAKFAST. 90 tablet 0  . lisinopril-hydrochlorothiazide (PRINZIDE,ZESTORETIC) 20-12.5 MG tablet TAKE 1 TABLET BY MOUTH DAILY. 90 tablet 1  . tetrahydrozoline 0.05 % ophthalmic solution Place 2 drops into both eyes as needed (for red eyes). Reported on 02/12/2016    . traMADol (ULTRAM) 50 MG tablet TAKE 1 TABLET BY MOUTH EVERY 6 HOURS AS NEEDED FOR PAIN 60 tablet 1  . verapamil (VERELAN PM) 240 MG 24 hr capsule Take 1 capsule (240 mg total) by mouth at bedtime. 90 capsule 3   No current facility-administered medications on file prior to visit.     BP 102/62 (BP Location: Left Arm, Patient Position: Sitting, Cuff Size: Normal)   Pulse 64   Temp 97.8 F (36.6 C) (Oral)   Ht 5\' 5"  (1.651 m)   Wt 183 lb 3.2 oz (83.1 kg)   SpO2 98%   BMI 30.49 kg/m     Review of Systems  Constitutional: Positive for activity change, appetite change, chills and fatigue. Negative for fever.  HENT: Negative for congestion, dental problem, hearing loss, rhinorrhea, sinus pressure, sore throat and tinnitus.   Eyes: Negative for pain, discharge and visual disturbance.  Respiratory: Negative for cough and shortness of breath.   Cardiovascular: Negative for chest pain, palpitations and leg swelling.  Gastrointestinal: Positive for  abdominal pain, diarrhea, nausea and vomiting. Negative for abdominal distention, blood in stool and constipation.  Genitourinary: Negative for difficulty urinating, dysuria, flank pain, frequency, hematuria, pelvic pain, urgency, vaginal bleeding, vaginal discharge and vaginal pain.  Musculoskeletal: Negative for arthralgias, gait problem and joint swelling.  Skin: Negative for rash.  Neurological: Negative for dizziness, syncope, speech difficulty, weakness, numbness and headaches.  Hematological: Negative for adenopathy.  Psychiatric/Behavioral: Negative for agitation, behavioral problems and dysphoric mood. The patient is not nervous/anxious.        Objective:   Physical Exam  Constitutional: She is oriented to person, place, and time. She appears well-developed and well-nourished.  HENT:  Head: Normocephalic.  Right Ear: External ear normal.  Left Ear: External ear normal.  Mouth/Throat: Oropharynx is clear and moist.  Eyes: Conjunctivae and EOM are normal. Pupils are equal, round, and reactive to light.  Neck: Normal range of motion. Neck supple. No thyromegaly present.  Cardiovascular: Normal rate, regular rhythm, normal heart sounds and intact distal pulses.   Pulmonary/Chest: Effort normal and breath sounds normal.  Abdominal: Soft. Bowel sounds are normal. She exhibits no mass. There is no tenderness.   Surgical scars in the peri umbilical and left lower quadrant No distention Bowel sounds are present Tenderness right lower quadrant greater than left No guarding Rebound present  Musculoskeletal: Normal range of motion.  Lymphadenopathy:    She has no cervical adenopathy.  Neurological: She is alert and oriented to person, place, and time.  Skin: Skin is warm and dry. No rash noted.  Psychiatric: She has a normal mood and affect. Her behavior is normal.          Assessment & Plan:   Recurrent acute diverticulitis.  Will check CBC and bmet; check abdominal CT to  rule out perforation ormicroabscess.  Will treat with Cipro and metronidazole. Consider inpatient treatment if there is not prompt clinical improvement or CT reveals complications Status post recent lacunar infarct Essential hypertension  Abigail [name]

## 2017-03-13 NOTE — Patient Instructions (Addendum)
WE NOW OFFER   [facility] FAST TRACK!!!  SAME DAY Appointments for ACUTE CARE  Such as: Sprains, Injuries, cuts, abrasions, rashes, muscle pain, joint pain, back pain Colds, flu, sore throats, headache, allergies, cough, fever  Ear pain, sinus and eye infections Abdominal pain, nausea, vomiting, diarrhea, upset stomach Animal/insect bites  3 Easy Ways to Schedule: Walk-In Scheduling Call in scheduling Mychart Sign-up: [email or web address]    Clear liquid diet.  Advance as tolerated  Go to the emergency room for evaluation of abdominal pain worsens or you develop worsening fever or chills  CT abdominal scan as discussed  Take your antibiotic as prescribed until ALL of it is gone, but stop if you develop a rash, swelling, or any side effects of the medication.  Contact our office as soon as possible if  there are side effects of the medication.  Return in 4 days for follow-up     Diverticulitis Diverticulitis is inflammation or infection of small pouches in your colon that form when you have a condition called diverticulosis. The pouches in your colon are called diverticula. Your colon, or large intestine, is where water is absorbed and stool is formed. Complications of diverticulitis can include:  Bleeding.  Severe infection.  Severe pain.  Perforation of your colon.  Obstruction of your colon.  What are the causes? Diverticulitis is caused by bacteria. Diverticulitis happens when stool becomes trapped in diverticula. This allows bacteria to grow in the diverticula, which can lead to inflammation and infection. What increases the risk? People with diverticulosis are at risk for diverticulitis. Eating a diet that does not include enough fiber from fruits and vegetables may make diverticulitis more likely to develop. What are the signs or symptoms? Symptoms of diverticulitis may include:  Abdominal pain and tenderness. The pain is normally  located on the left side of the abdomen, but may occur in other areas.  Fever and chills.  Bloating.  Cramping.  Nausea.  Vomiting.  Constipation.  Diarrhea.  Blood in your stool.  How is this diagnosed? Your health care provider will ask you about your medical history and do a physical exam. You may need to have tests done because many medical conditions can cause the same symptoms as diverticulitis. Tests may include:  Blood tests.  Urine tests.  Imaging tests of the abdomen, including X-rays and CT scans.  When your condition is under control, your health care provider may recommend that you have a colonoscopy. A colonoscopy can show how severe your diverticula are and whether something else is causing your symptoms. How is this treated? Most cases of diverticulitis are mild and can be treated at home. Treatment may include:  Taking over-the-counter pain medicines.  Following a clear liquid diet.  Taking antibiotic medicines by mouth for 7-10 days.  More severe cases may be treated at a hospital. Treatment may include:  Not eating or drinking.  Taking prescription pain medicine.  Receiving antibiotic medicines through an IV tube.  Receiving fluids and nutrition through an IV tube.  Surgery.  Follow these instructions at home:  Follow your health care provider's instructions carefully.  Follow a full liquid diet or other diet as directed by your health care provider. After your symptoms improve, your health care provider may tell you to change your diet. He or she may recommend you eat a high-fiber diet. Fruits and vegetables are good sources of fiber. Fiber makes it easier to pass stool.  Take fiber supplements or probiotics as directed by your  health care provider.  Only take medicines as directed by your health care provider.  Keep all your follow-up appointments. Contact a health care provider if:  Your pain does not improve.  You have a hard time  eating food.  Your bowel movements do not return to normal. Get help right away if:  Your pain becomes worse.  Your symptoms do not get better.  Your symptoms suddenly get worse.  You have a fever.  You have repeated vomiting.  You have bloody or black, tarry stools. This information is not intended to replace advice given to you by your health care provider. Make sure you discuss any questions you have with your health care provider. Document Released: 06/15/2005 Document Revised: 02/11/2016 Document Reviewed: 07/31/2013 Elsevier Interactive Patient Education  2017 [employer]

## 2017-03-25 ENCOUNTER — Other Ambulatory Visit: Payer: Self-pay | Admitting: Internal Medicine

## 2017-04-04 ENCOUNTER — Telehealth: Payer: Self-pay | Admitting: Internal Medicine

## 2017-04-04 NOTE — Telephone Encounter (Signed)
Okay to stop the diclofenac abruptly and no need to slowly taper

## 2017-04-04 NOTE — Telephone Encounter (Signed)
Spoke with patient and informed her that its is okay to stop the diclofenac abruptly and no need to slowly taper. Patient verbalized understanding.

## 2017-04-04 NOTE — Telephone Encounter (Signed)
Pt would like to know how she should stop taking Rx diclofenac (Dr. Venetia MaxonStern neurosurgeon told her to stop taking the medication but did not instruct her how to stop taking it).  Pt would like to see if she should stop suddenly or taper off.  Pt would like to know if you agree for her stop taking the Rx Dr. Venetia MaxonStern state that it might be causing her to have little mini strokes.  Pt would like to her back today if possible.

## 2017-04-04 NOTE — Telephone Encounter (Signed)
Please advise 

## 2017-04-24 ENCOUNTER — Other Ambulatory Visit: Payer: Self-pay | Admitting: Internal Medicine

## 2017-04-30 ENCOUNTER — Other Ambulatory Visit: Payer: Self-pay | Admitting: Internal Medicine

## 2017-05-02 ENCOUNTER — Ambulatory Visit: Payer: Medicare Other | Admitting: Neurology

## 2017-05-14 ENCOUNTER — Other Ambulatory Visit: Payer: Self-pay | Admitting: Internal Medicine

## 2017-05-19 ENCOUNTER — Ambulatory Visit (INDEPENDENT_AMBULATORY_CARE_PROVIDER_SITE_OTHER): Payer: Medicare Other | Admitting: Neurology

## 2017-05-19 ENCOUNTER — Encounter: Payer: Self-pay | Admitting: Neurology

## 2017-05-19 ENCOUNTER — Encounter (INDEPENDENT_AMBULATORY_CARE_PROVIDER_SITE_OTHER): Payer: Self-pay

## 2017-05-19 VITALS — BP 119/73 | HR 55 | Ht 65.0 in | Wt 187.8 lb

## 2017-05-19 DIAGNOSIS — G3184 Mild cognitive impairment, so stated: Secondary | ICD-10-CM | POA: Diagnosis not present

## 2017-05-19 DIAGNOSIS — I639 Cerebral infarction, unspecified: Secondary | ICD-10-CM

## 2017-05-19 DIAGNOSIS — I6381 Other cerebral infarction due to occlusion or stenosis of small artery: Secondary | ICD-10-CM

## 2017-05-19 NOTE — Patient Instructions (Signed)
I had a long d/w patient about her recent lacunar thalamicstroke,mild cognitive impairment risk for recurrent stroke/TIAs, personally independently reviewed imaging studies and stroke evaluation results and answered questions.Continue Plavix for secondary stroke prevention and maintain strict control of hypertension with blood pressure goal below 130/90, diabetes with hemoglobin A1c goal below 6.5% and lipids with LDL cholesterol goal below 70 mg/dL. I also advised the patient to eat a healthy diet with plenty of whole grains, cereals, fruits and vegetables, exercise regularly and maintain ideal body weight. I encouraged her to participate in cognitively challenging activities like solving crossword puzzles, playing sudoku and bridge. We also discussed memory compensation strategies. Followup in the future with my nurse practitioner in 6 months or call earlier if necessary Memory Compensation Strategies  1. Use "WARM" strategy.  W= write it down  A= associate it  R= repeat it  M= make a mental note  2.   You can keep a Glass blower/designerMemory Notebook.  Use a 3-ring notebook with sections for the following: calendar, important names and phone numbers,  medications, doctors' names/phone numbers, lists/reminders, and a section to journal what you did  each day.   3.    Use a calendar to write appointments down.  4.    Write yourself a schedule for the day.  This can be placed on the calendar or in a separate section of the Memory Notebook.  Keeping a  regular schedule can help memory.  5.    Use medication organizer with sections for each day or morning/evening pills.  You may need help loading it  6.    Keep a basket, or pegboard by the door.  Place items that you need to take out with you in the basket or on the pegboard.  You may also want to  include a message board for reminders.  7.    Use sticky notes.  Place sticky notes with reminders in a place where the task is performed.  For example: " turn off the   stove" placed by the stove, "lock the door" placed on the door at eye level, " take your medications" on  the bathroom mirror or by the place where you normally take your medications.  8.    Use alarms/timers.  Use while cooking to remind yourself to check on food or as a reminder to take your medicine, or as a  reminder to make a call, or as a reminder to perform another task, etc.

## 2017-05-19 NOTE — Progress Notes (Signed)
[facility] [address] [place] [address] [phone]       OFFICE FOLLOW UP VISIT NOTE  [name] Date of Birth:  [birth year] Medical Record Number:  [id number]   Referring MD:  Beverely Low  Reason for Referral:  Dizziness and abnormal MRI brain  HPI: Initial Consult 06/03/2015 :  Ms Abigail [name] is a 57 year pleasant lady whose had five-month history of gait and balance difficulties. She just feels she often leans to the left and stumbles if she is not careful. She feels the symptoms began suddenly one day when she noticed trouble with balance and walking she also had some trouble swallowing but she did not seek immediate help at that time. She does have a prior history of her left thalamic lacunar infarct in 2007 for which she had seen me. That was felt to be due to small vessel disease and she was started on Aggrenox which did not tolerate due to side effects. She was lost to medical follow-up to me. She is recently had an outpatient MRI scan of the brain which have personally reviewed on 05/02/15 which does show a right pontine and cerebellar hyperintensity which is likely a age indeterminate lacunar infarcts which were not noticed on the previous MRI and perhaps may be the explanation of her recent dizziness and gait difficulties. The patient is currently on Plavix which is tolerating well without bleeding or bruising. She states her blood pressure has been difficult to control and today it is elevated at 171/97 in office. She did also had some recent chest pain or shortness of breath for which she underwent cardiac catheterization last Monday which did not show any correctable lesion. Dr. Jens Som for cardiologist has also added a baby aspirin and she seems to be having increased bruising tendency since then. She's not sure when her last lipid profile was checked but she does take Lipitor 40 mg daily which is tolerating well without side effects. She does have  history of sleep apnea and had surgery for that and does not use CPAP Update 11/17/15 : She returns for follow-up after last visit 5 months ago. She is accompanied by husband. Patient states she was doing fine until a few weeks ago since then she's noticed intermittent tingling in the left hand as well as weakness and dropping of objects from that hand. She's also noticed some walking difficulty and having to drag her left leg. She denies any slurred speech, headache, fall or injury. She was seen by me in September of last year and at that time MRI scan of the brain prior to that visit had shown small remote age lacunar infarcts in the right cerebellum and pons. I had ordered MRA of the neck and brain which have personally reviewed done on 06/03/15 which showed no significant intra-or extracranial stenosis. Lab work on 06/03/15 showed LDL cholesterol 84 but HDL of 77 with the ratio of 2.3. Hemoglobin A1c was borderline at 6.0. Patient has been on Plavix since 2006 following her previous stroke. She was unable to tolerate Aggrenox at that time. Patient states she is having gradual worsening of her balance and walking since spring of last year and perhaps had a small stroke at that time which was not recognized. She states she is tolerating Plavix well without bleeding or bruising. Her blood pressure is well controlled and today it is 139/76. The patient is refusing a referral to physical outpatient therapy but she wants to go and participate  in the Silver sneakers program at [facility]. She is tolerating Lipitor without myalgias or arthralgias. She does use a CPAP for sleep apnea. Update 05/19/2017 ; she returns for follow-up after last visit for year and a half ago. She however was admitted to the hospital in June 2018 with a right lip and hand paresthesias. MRI scan did show a small left thalamic lacunar infarct. MRA of the brain was unremarkable. Carotid Doppler showed no significant extracranial stenosis.  Transthoracic echo showed normal ejection fraction. LDL cholesterol was borderline at 72 mg percent. Hemoglobin A1c was 6.1. Patient was started on aspirin 81 and Plavix 75 mg per 3 weeks and since then she has discontinued aspirin and is currently on Plavix alone. She is tolerating well without bruising or bleeding. She states her right lip numbness has resolved but right little finger in [place] persists. She has seen Dr. Venetia Maxon for back pain and he has discontinued both) and replace it with Aleve and tramadol. Patient also complains of subjective mild memory difficulties since her stroke. She has trouble remembering recent information  and names. ROS:   14 system review of systems is positive for numbness, tingling,  shortness of breath, memory loss, headache, back pain walking difficulty and all other systems negative e and all other systems negative  PMH:  Past Medical History:  Diagnosis Date  . Bronchitis 09/2016  . Bruises easily   . DDD (degenerative disc disease)   . Degenerative joint disease of cervical spine 09-05-11   Cervival area, now some osteoarthritis-lower back and Rt. shoulder  . Depression   . Diverticulitis 09-05-11   hx. gastritis, diverticulitis x2 -now surgery planned  . DIVERTICULITIS, ACUTE 02/10/2010  . Essential hypertension 09-05-11   tx. Verapamil  . GROIN PAIN 09/21/2010  . Headache(784.0)    headaches are better  . Hearing loss   . Heart palpitations   . Hemorrhoids   . History of bronchitis   . History of colonic polyps   . History of pneumonia   . History of tension headache   . Hyperlipidemia   . Hypertension   . Hypothyroidism 09-05-11   Supplement used  . Late effect of adverse effect of drug, medicinal or biological substance   . NECK PAIN, ACUTE 09/21/2010  . Nocturia   . Osteoarthritis 112-17-12   spine and rt. hip, rt. shoulder  . Patent foramen ovale    Small - unable to be closed -   . Pneumonia   . PONV (postoperative nausea and  vomiting)   . Sleep apnea 09-05-11   no cpap ever, had surgery to remove cartilage, no problems now  . SORE THROAT 04/30/2009  . Stroke (HCC) 09-05-11   2006/2009-(loss of memory, balance issues remains occ.)  . TIA 09/28/2007, 10/2014  . Unstable angina (HCC)    a. 05/2010 Cath: nl cors, EF 55%;  b. 05/2015 Lexiscan MV: small, severe, fixed apical defect and a small, severe, reversible inf lateral defect w/ apical thinning and mild ischemia, EF 54%.  Marland Kitchen URI 09/02/2008  . Vertigo     Social History:  Social History   Social History  . Marital status: Married    Spouse name: N/A  . Number of children: N/A  . Years of education: N/A   Occupational History  . Not on file.   Social History Main Topics  . Smoking status: Former Smoker    Packs/day: 0.50    Years: 5.00    Types: Cigarettes    Quit  date: 09/19/1968  . Smokeless tobacco: Never Used  . Alcohol use 1.2 oz/week    1 Cans of beer, 1 Shots of liquor per week     Comment: rarely  . Drug use: No  . Sexual activity: Yes   Other Topics Concern  . Not on file   Social History Narrative   Lives in [place] with her husband.  She does not routinely exercise.    Medications:   Current Outpatient Prescriptions on File Prior to Visit  Medication Sig Dispense Refill  . atorvastatin (LIPITOR) 40 MG tablet TAKE 1 TABLET BY MOUTH EVERY DAY 90 tablet 1  . BIOTIN 5000 PO Take 5,000 mcg by mouth daily.    . butalbital-acetaminophen-caffeine (FIORICET, ESGIC) 50-325-40 MG tablet TAKE 1 TABLET BY MOUTH EVERY 6 HOURS AS NEEDED FOR HEADACHE 60 tablet 1  . clopidogrel (PLAVIX) 75 MG tablet Take 1 tablet (75 mg total) by mouth every evening. 90 tablet 1  . levothyroxine (SYNTHROID, LEVOTHROID) 125 MCG tablet TAKE 1 TABLET BY MOUTH DAILY BEFORE BREAKFAST. 90 tablet 0  . lisinopril-hydrochlorothiazide (PRINZIDE,ZESTORETIC) 20-12.5 MG tablet TAKE 1 TABLET BY MOUTH DAILY. 90 tablet 1  . tetrahydrozoline 0.05 % ophthalmic solution Place 2 drops  into both eyes as needed (for red eyes). Reported on 02/12/2016    . traMADol (ULTRAM) 50 MG tablet TAKE 1 TABLET BY MOUTH EVERY 6 HOURS AS NEEDED FOR PAIN 60 tablet 0  . verapamil (VERELAN PM) 240 MG 24 hr capsule Take 1 capsule (240 mg total) by mouth at bedtime. 90 capsule 3   No current facility-administered medications on file prior to visit.     Allergies:   Allergies  Allergen Reactions  . Dilaudid [Hydromorphone Hcl] Shortness Of Breath, Nausea And Vomiting and Other (See Comments)    Able to take morphine without issue  . Shellfish Allergy Shortness Of Breath and Rash    Only shrimp allergy  . Aggrenox [Aspirin-Dipyridamole Er] Nausea And Vomiting and Other (See Comments)    Headache   . Cephalexin Itching and Rash  . Codeine Phosphate Nausea And Vomiting  . Contrast Media [Iodinated Diagnostic Agents] Nausea And Vomiting  . Hydrocodone Nausea And Vomiting and Other (See Comments)    Can take morphine without issue  . Meperidine Hcl Nausea And Vomiting  . Percocet [Oxycodone-Acetaminophen] Nausea And Vomiting and Other (See Comments)    Can take morphine without issue    Physical Exam General: well developed, well nourished [place] African-American lady , seated, in no evident distress Head: head normocephalic and atraumatic.   Neck: supple with no carotid or supraclavicular bruits Cardiovascular: regular rate and rhythm, no murmurs Musculoskeletal: no deformity Skin:  no rash/petichiae Vascular:  Normal pulses all extremities  Neurologic Exam Mental Status: Awake and fully alert. Oriented to place and time. Recent and remote memory intact. Attention span, concentration and fund of knowledge appropriate. Mood and affect appropriate. Recall 3/3. Able to name 11 animals with four legs. Clock drawing 4/4. Cranial Nerves: Fundoscopic exam not done. Pupils equal, briskly reactive to light. Extraocular movements full without nystagmus. Visual fields full to confrontation.  Hearing intact. Facial sensation intact. Face, tongue, palate moves normally and symmetrically.  Motor: Normal bulk and tone.Mild weakness of left grip and intrinsic hand muscles. Orbits right over left upper extremity. Mild weakness of left hip flexors only.  Sensory.: intact to touch , pinprick , position and vibratory sensation. Mild subjective paresthesias in the right little finger but no objective sensory loss Coordination: Rapid  alternating movements normal in all extremities. Finger-to-nose and heel-to-shin performed accurately bilaterally. Gait and Station: Arises from chair without difficulty. Stance is slightly broad-based Gait demonstrates normal stride length and balance but slight dragging of the left leg.. Unable to heel, toe and tandem walk without difficulty.   Reflexes: 1+ and symmetric. Toes downgoing.   NIHSS  0 Modified Rankin  2   ASSESSMENT: [age] year Caucasian lady with five-month history of gait and balance difficulties likely due to right pontine and cerebellar infarcts etiology likely small vessel disease. Multiple vascular risk factors of hypertension, hyperlipidemia, h/o sleep apnoea  .new left thalamic infarct in June 2018 with mild right lip and hand paresthesias. New complains of mild cognitive impairment  PLAN: I had a long d/w patient about her recent lacunar thalamicstroke,mild cognitive impairment risk for recurrent stroke/TIAs, personally independently reviewed imaging studies and stroke evaluation results and answered questions.Continue Plavix for secondary stroke prevention and maintain strict control of hypertension with blood pressure goal below 130/90, diabetes with hemoglobin A1c goal below 6.5% and lipids with LDL cholesterol goal below 70 mg/dL. I also advised the patient to eat a healthy diet with plenty of whole grains, cereals, fruits and vegetables, exercise regularly and maintain ideal body weight. I encouraged her to participate in cognitively  challenging activities like solving crossword puzzles, playing sudoku and bridge. We also discussed memory compensation strategies. Followup in the future with my nurse practitioner in 6 months or call earlier if necessary Greater than 50% time during this 30 minute visit was spent on counseling and coordination of care for her lacunar stroke and mild cognitive impairment Followup in the future with stroke nurse practitioner in 3 months or call earlier if necessary.  Delia HeadyPramod Aivy Akter, MD Note: This document was prepared with digital dictation and possible smart phrase technology. Any transcriptional errors that result from this process are unintentional.

## 2017-05-26 ENCOUNTER — Other Ambulatory Visit: Payer: Self-pay | Admitting: Internal Medicine

## 2017-05-29 ENCOUNTER — Ambulatory Visit (INDEPENDENT_AMBULATORY_CARE_PROVIDER_SITE_OTHER): Payer: Medicare Other | Admitting: Internal Medicine

## 2017-05-29 ENCOUNTER — Encounter: Payer: Self-pay | Admitting: Internal Medicine

## 2017-05-29 VITALS — BP 122/70 | HR 82 | Temp 98.2°F | Ht 65.0 in | Wt 186.0 lb

## 2017-05-29 DIAGNOSIS — I10 Essential (primary) hypertension: Secondary | ICD-10-CM

## 2017-05-29 DIAGNOSIS — M549 Dorsalgia, unspecified: Secondary | ICD-10-CM | POA: Diagnosis not present

## 2017-05-29 MED ORDER — TRAMADOL HCL 50 MG PO TABS: 50.0000 mg | ORAL_TABLET | Freq: Four times a day (QID) | ORAL | 0 refills | Status: DC | PRN

## 2017-05-29 NOTE — Patient Instructions (Signed)
You  may move around, but avoid painful motions and activities.  Apply ice to the sore area for 15 to 20 minutes 3 or 4 times daily for the next two to 3 days.  Use Aleve twice daily  Tramadol as needed for pain  Call or return to clinic prn if these symptoms worsen or fail to improve as anticipated.
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
## 2017-05-30 ENCOUNTER — Other Ambulatory Visit: Payer: Self-pay | Admitting: Internal Medicine

## 2017-07-06 ENCOUNTER — Telehealth: Payer: Self-pay

## 2017-07-06 NOTE — Telephone Encounter (Signed)
Rn fax clearance form twice to [phone] to [facility].

## 2017-07-09 ENCOUNTER — Other Ambulatory Visit: Payer: Self-pay | Admitting: Internal Medicine

## 2017-07-11 ENCOUNTER — Telehealth: Payer: Self-pay | Admitting: Internal Medicine

## 2017-07-12 ENCOUNTER — Other Ambulatory Visit: Payer: Self-pay | Admitting: Orthopedic Surgery

## 2017-07-12 DIAGNOSIS — M545 Low back pain: Principal | ICD-10-CM

## 2017-07-12 DIAGNOSIS — G8929 Other chronic pain: Secondary | ICD-10-CM

## 2017-07-17 NOTE — Telephone Encounter (Signed)
rx was faxed to the pharmacy. 

## 2017-07-17 NOTE — Telephone Encounter (Signed)
The pharmacy never received the butalbital-acetaminophen-caffeine (FIORICET, ESGIC) 50-325-40 MG tablet  Can you resend?  [id number] - [place], [place] - [address] AT [facility]

## 2017-07-24 ENCOUNTER — Telehealth: Payer: Self-pay | Admitting: Internal Medicine

## 2017-07-24 ENCOUNTER — Other Ambulatory Visit: Payer: Self-pay | Admitting: Internal Medicine

## 2017-07-24 MED ORDER — TRAMADOL HCL 50 MG PO TABS: 50.0000 mg | ORAL_TABLET | Freq: Four times a day (QID) | ORAL | 0 refills | Status: DC | PRN

## 2017-07-24 NOTE — Telephone Encounter (Signed)
° ° ° ° ° °  Pt request refill of the following: ° ° °traMADol (ULTRAM) 50 MG tablet ° ° °Phamacy: °

## 2017-07-24 NOTE — Telephone Encounter (Signed)
Medication was called.

## 2017-08-03 ENCOUNTER — Other Ambulatory Visit: Payer: Self-pay | Admitting: Orthopedic Surgery

## 2017-08-03 ENCOUNTER — Ambulatory Visit
Admission: RE | Admit: 2017-08-03 | Discharge: 2017-08-03 | Disposition: A | Discharge status: Home / Self Care | Payer: Medicare Other | Source: Ambulatory Visit | Attending: Orthopedic Surgery | Admitting: Orthopedic Surgery

## 2017-08-03 DIAGNOSIS — G8929 Other chronic pain: Secondary | ICD-10-CM

## 2017-08-03 DIAGNOSIS — M545 Low back pain: Principal | ICD-10-CM

## 2017-08-03 MED ORDER — METHYLPREDNISOLONE ACETATE 40 MG/ML INJ SUSP (RADIOLOG
120.0000 mg | Freq: Once | INTRAMUSCULAR | Status: AC
  Administered 2017-08-03: 120 mg via EPIDURAL

## 2017-08-03 MED ORDER — IOPAMIDOL (ISOVUE-M 200) INJECTION 41%
1.0000 mL | Freq: Once | INTRAMUSCULAR | Status: AC
  Administered 2017-08-03: 1 mL via EPIDURAL

## 2017-08-03 NOTE — Discharge Instructions (Signed)

## 2017-08-11 ENCOUNTER — Other Ambulatory Visit: Payer: Self-pay | Admitting: Internal Medicine

## 2017-08-15 NOTE — Telephone Encounter (Signed)
Tramadol 50 mg tab #60 was called into the pharmacy on :07/24/17   Pt had another provider Lunette Stands(Anna Voytek) prescribe her Tramadol 50 mg #40 on 08/14/17.  Information was obtained by [employer] pharmacy tech.   Pt is requesting medication be refilled. Please advise.

## 2017-08-16 NOTE — Telephone Encounter (Signed)
Okay #50

## 2017-08-16 NOTE — Telephone Encounter (Signed)
Rx printed, awaiting Rx to be signed.

## 2017-08-17 NOTE — Telephone Encounter (Signed)
Rx faxed to pharmacy  

## 2017-09-08 ENCOUNTER — Other Ambulatory Visit: Payer: Self-pay | Admitting: Internal Medicine

## 2017-09-18 ENCOUNTER — Telehealth: Payer: Self-pay | Admitting: Internal Medicine

## 2017-09-18 NOTE — Telephone Encounter (Signed)
Copied from CRM [id number]. Topic: Quick Communication - See Telephone Encounter >> Sep 18, 2017  9:12 AM Floria RavelingStovall, Shana A wrote: CRM for notification. See Telephone encounter for: pt called in and said that she brought a paper in from the ins compy back in the end of nov and 1st of dec.  She said that she gave it to one of the ladies that checked her in.  It was for her butalbital-acetaminophen-caffeine Marikay Alar(FIORICET, ESGIC) 50-325-40 MG tablet [id number]] .  She is wanting to know the status of this paper?  It was for approval for her ins. She does not have a copy of this.  Cell number  [phone]   09/18/17.

## 2017-09-20 NOTE — Telephone Encounter (Signed)
Pt calling back and states that her insurance company cannot fax the form directly to [place] office but the prior auth can be called into her insurance [phone].

## 2017-09-20 NOTE — Telephone Encounter (Signed)
I have not seen paperwork for Fiorocet. Pt was called and asked for her to have insurance faxed over the form so that it my be filled out. Pt verbalized understanding.

## 2017-09-20 NOTE — Telephone Encounter (Signed)
Prior auth sent to [id number].

## 2017-09-20 NOTE — Telephone Encounter (Signed)
Patient very upset that no one has called her back, states this has been going on since Nov. Requesting call back today. Need this meds. Please advise [phone] or [phone]

## 2017-09-22 NOTE — Telephone Encounter (Signed)
Patient is calling back and requesting Clayborne Danaatti give her a call back with the status of this. I notified patient of the message 'prior auth sent to [email or web address]' and she is not understanding and wants to speak with Patti. Please advise.

## 2017-09-22 NOTE — Telephone Encounter (Signed)
Pt stated that I needed to call her insurance company for the prior auth  ([phone]) Pt was informed that the PA is currently being processed.  We will call her back as soon as we have a response from the insurance company.

## 2017-09-26 ENCOUNTER — Other Ambulatory Visit: Payer: Self-pay | Admitting: Internal Medicine

## 2017-09-26 NOTE — Telephone Encounter (Signed)
Fax received from [employer] stating the Rx was not approved as this is not on the insurance's formulary. I called the pt and informed her of this and she stated she received a letter also and does not want to take another medication due to side effects and will pay for this.  [facility] Am[facility] CunasKwiatkowski.

## 2017-09-27 NOTE — Telephone Encounter (Signed)
Noted.  Pt to pay for medication.

## 2017-09-27 NOTE — Telephone Encounter (Signed)
90 day supply sent to the pharmacy by e-scribe.  Pt had normal lipid panel on 02/24/17 (see hospital lab work).

## 2017-09-29 ENCOUNTER — Other Ambulatory Visit: Payer: Self-pay | Admitting: Orthopedic Surgery

## 2017-09-29 DIAGNOSIS — M48061 Spinal stenosis, lumbar region without neurogenic claudication: Secondary | ICD-10-CM

## 2017-10-03 MED ORDER — BUTALBITAL-APAP-CAFFEINE 50-325-40 MG PO TABS: ORAL_TABLET | ORAL | 0 refills | Status: DC

## 2017-10-03 NOTE — Telephone Encounter (Signed)
Rx printed. To PCP for signature. 

## 2017-10-03 NOTE — Telephone Encounter (Signed)
Rx faxed to pharmacy by Annice PihPatricia Jaimes, LPN.

## 2017-10-03 NOTE — Addendum Note (Signed)
Addended by: Starla LinkAKLEY, Danyel Tobey J on: 10/03/2017 01:13 PM   Modules accepted: Orders

## 2017-10-03 NOTE — Telephone Encounter (Signed)
Pt is requesting Dr Amador CunasKwiatkowski [facility] [place] and she will pay for it,  She states she has talked to the pharmacy and knows the cost.

## 2017-10-03 NOTE — Telephone Encounter (Signed)
Okay #60 

## 2017-10-09 ENCOUNTER — Other Ambulatory Visit: Payer: Self-pay | Admitting: Internal Medicine

## 2017-10-10 ENCOUNTER — Other Ambulatory Visit: Payer: Self-pay | Admitting: Orthopedic Surgery

## 2017-10-10 ENCOUNTER — Ambulatory Visit
Admission: RE | Admit: 2017-10-10 | Discharge: 2017-10-10 | Disposition: A | Discharge status: Home / Self Care | Payer: Medicare Other | Source: Ambulatory Visit | Attending: Orthopedic Surgery | Admitting: Orthopedic Surgery

## 2017-10-10 DIAGNOSIS — M48061 Spinal stenosis, lumbar region without neurogenic claudication: Secondary | ICD-10-CM

## 2017-10-10 MED ORDER — IOPAMIDOL (ISOVUE-M 200) INJECTION 41%
1.0000 mL | Freq: Once | INTRAMUSCULAR | Status: AC
  Administered 2017-10-10: 1 mL via EPIDURAL

## 2017-10-10 MED ORDER — METHYLPREDNISOLONE ACETATE 40 MG/ML INJ SUSP (RADIOLOG
120.0000 mg | Freq: Once | INTRAMUSCULAR | Status: AC
  Administered 2017-10-10: 120 mg via EPIDURAL

## 2017-10-10 NOTE — Discharge Instructions (Signed)
Spinal Injection Discharge Instruction Sheet ° °1. You may resume a regular diet and any medications that you routinely take, including pain medications. ° °2. No driving the rest of the day of the procedure. ° °3. Light activity throughout the rest of the day.  Do not do any strenuous work, exercise, bending or lifting.  The day following the procedure, you may resume normal physical activity but you should refrain from exercising or physical therapy for at least three days. ° ° °Common Side Effects: ° °· Headaches- take your usual medications as directed by your physician.   ° °· Restlessness or inability to sleep- you may have trouble sleeping for the next few days.  Ask your referring physician if you need any medication for sleep if over the counter sleep medications do not help. ° °· Facial flushing or redness- this should subside within a few days. ° °· Increased pain- a temporary increase in pain a day or two following your procedure is not unusual.  Take your pain medication as prescribed by your referring physician.  You may use ice to the injection site as needed.  Please do not use heat for 24 hours. ° °· Leg cramps ° °Please contact our office at [phone] for the following symptoms: °· Fever greater than 100 degrees. °· Headaches unresolved with medication after 2-3 days. °· Increased swelling, pain, or redness at injection site. ° °Thank you for visiting our office. ° ° °You may resume Plavix today. °

## 2017-10-17 ENCOUNTER — Ambulatory Visit: Payer: Self-pay | Admitting: *Deleted
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
## 2017-10-18 ENCOUNTER — Ambulatory Visit: Payer: Medicare Other | Admitting: Family Medicine

## 2017-10-18 ENCOUNTER — Encounter: Payer: Self-pay | Admitting: Family Medicine

## 2017-10-18 VITALS — BP 118/62 | HR 66 | Temp 98.2°F | Wt 192.2 lb

## 2017-10-18 DIAGNOSIS — J209 Acute bronchitis, unspecified: Secondary | ICD-10-CM | POA: Diagnosis not present

## 2017-10-18 DIAGNOSIS — J069 Acute upper respiratory infection, unspecified: Secondary | ICD-10-CM

## 2017-10-18 MED ORDER — ALBUTEROL SULFATE HFA 108 (90 BASE) MCG/ACT IN AERS
2.0000 | INHALATION_SPRAY | RESPIRATORY_TRACT | 2 refills | Status: DC | PRN
Start: 2017-10-18 — End: 2018-05-31

## 2017-10-18 MED ORDER — METHYLPREDNISOLONE 4 MG PO TBPK: ORAL_TABLET | ORAL | 0 refills | Status: DC

## 2017-10-18 NOTE — Progress Notes (Signed)
   Subjective:    Patient ID: Abigail [name], female    DOB: 03/15/1946, 72 y.o.   MRN: [id number]  HPI Here for 2 weeks of intermittent dry cough, hoarseness, and SOB. No fever. No chest pain. No hx of asthma. She had a cardiac cath in 2016 revealing non-obstructive CAD and good LV function.    Review of Systems  Constitutional: Negative.   HENT: Positive for postnasal drip and voice change. Negative for sinus pressure, sinus pain and sore throat.   Respiratory: Positive for cough and shortness of breath. Negative for chest tightness and wheezing.   Cardiovascular: Negative.   Neurological: Negative.        Objective:   Physical Exam  Constitutional: She is oriented to person, place, and time. She appears well-developed and well-nourished. No distress.  HENT:  Right Ear: External ear normal.  Left Ear: External ear normal.  Nose: Nose normal.  Mouth/Throat: Oropharynx is clear and moist.  Eyes: Conjunctivae are normal.  Neck: No thyromegaly present.  Cardiovascular: Normal rate, regular rhythm, normal heart sounds and intact distal pulses.  Pulmonary/Chest: Effort normal and breath sounds normal. No respiratory distress. She has no wheezes. She has no rales.  Lymphadenopathy:    She has no cervical adenopathy.  Neurological: She is alert and oriented to person, place, and time.          Assessment & Plan:  She seems to have a viral URI causing some bronchospasm. She will try a Medrol dose pack and a Ventolin HFA inhaler prn. Recheck prn.  Gershon CraneStephen Calliope Delangel, MD

## 2017-10-20 ENCOUNTER — Telehealth: Payer: Self-pay | Admitting: Internal Medicine

## 2017-10-20 NOTE — Telephone Encounter (Signed)
Copied from CRM [id number]. Topic: Quick Communication - See Telephone Encounter >> Oct 20, 2017  9:43 AM Landry MellowFoltz, Melissa J wrote: CRM for notification. See Telephone encounter for:   10/20/17. Insurance company is calling to ask for PA for butalbital-acetaminophen-caffeine (FIORICET, ESGIC) [phone] .  [employer] [phone] PA departments phone number

## 2017-10-23 ENCOUNTER — Telehealth: Payer: Self-pay | Admitting: Internal Medicine

## 2017-10-23 ENCOUNTER — Other Ambulatory Visit: Payer: Self-pay

## 2017-10-23 NOTE — Telephone Encounter (Signed)
Routed to [place] Rico[place]anne for PA.

## 2017-10-23 NOTE — Telephone Encounter (Signed)
Copied from CRM [id number]. Topic: Quick Communication - See Telephone Encounter >> Oct 23, 2017  3:56 PM Everardo PacificMoton, Chrisanne Loose, [place] wrote: CRM for notification. Patient calling because she wanted to check the status of her Butalbital-Acetamininophen-Caffeine (Fioricet, ESGIC)50-325-40 PA Stated that she still needs the refill and would like the medications to be sent to the [facility] [address] [place] [phone] 10/23/17.

## 2017-10-24 ENCOUNTER — Other Ambulatory Visit: Payer: Self-pay | Admitting: Internal Medicine

## 2017-10-24 DIAGNOSIS — Z139 Encounter for screening, unspecified: Secondary | ICD-10-CM

## 2017-10-25 NOTE — Telephone Encounter (Signed)
Copied from CRM [id number]. Topic: Quick Communication - See Telephone Encounter >> Oct 23, 2017  3:56 PM Everardo PacificMoton, Kelly, [place] wrote: CRM for notification. Patient calling because she wanted to check the status of her Butalbital-Acetamininophen-Caffeine (Fioricet, ESGIC)50-325-40 PA Stated that she still needs the refill and would like the medications to [facility] [address] [place] [phone] 10/23/17. >> Oct 25, 2017  3:08 PM Rudi CocoLathan, Ciara Kagan M, NT wrote: Misty StanleyLisa from  [employer] company is calling to ask for PA for butalbital-acetaminophen-caffeine (FIORICET, ESGIC) [phone] .  [employer] [phone] PA departments phone number   Refill was sent but it did not have caffeine on the rx so pt. Was still denied

## 2017-10-27 ENCOUNTER — Encounter: Payer: Self-pay | Admitting: *Deleted

## 2017-10-27 NOTE — Telephone Encounter (Signed)
I called [employer] and spoke with Roseanne and she stated the Rx was sent for appeals and denied.  In order to approve they need a note stating the pt is actually taking "Butalbital-Acetaminophen-Caffeine tablets 50-325-40mg " with a diagnosis and this can be faxed.  Fax number for [employer] is [phone]#[id number] and for appeals [phone].  I faxed a letter with this information to both numbers above.

## 2017-10-30 NOTE — Telephone Encounter (Signed)
Fax received from [employer] dated 10/28/2017 stating the request was denied and given to Dr Vernon PreyKwiatkowski's asst.

## 2017-10-30 NOTE — Telephone Encounter (Signed)
See prior note

## 2017-11-03 NOTE — Telephone Encounter (Signed)
Please advise alternative medication.

## 2017-11-06 NOTE — Telephone Encounter (Signed)
Spoke with patient regarding OTC advise per Dr.K.

## 2017-11-06 NOTE — Telephone Encounter (Signed)
Suggest Excedrin Migraine which is OTC

## 2017-11-13 ENCOUNTER — Ambulatory Visit
Admission: RE | Admit: 2017-11-13 | Discharge: 2017-11-13 | Disposition: A | Discharge status: Home / Self Care | Payer: Medicare Other | Source: Ambulatory Visit | Attending: Internal Medicine | Admitting: Internal Medicine

## 2017-11-13 ENCOUNTER — Encounter: Payer: Self-pay | Admitting: Internal Medicine

## 2017-11-13 ENCOUNTER — Ambulatory Visit: Payer: Medicare Other | Admitting: Internal Medicine

## 2017-11-13 VITALS — BP 102/70 | HR 63 | Temp 98.0°F | Wt 188.0 lb

## 2017-11-13 DIAGNOSIS — G8190 Hemiplegia, unspecified affecting unspecified side: Secondary | ICD-10-CM

## 2017-11-13 DIAGNOSIS — E039 Hypothyroidism, unspecified: Secondary | ICD-10-CM

## 2017-11-13 DIAGNOSIS — M5440 Lumbago with sciatica, unspecified side: Secondary | ICD-10-CM

## 2017-11-13 DIAGNOSIS — I679 Cerebrovascular disease, unspecified: Secondary | ICD-10-CM

## 2017-11-13 DIAGNOSIS — G8929 Other chronic pain: Secondary | ICD-10-CM

## 2017-11-13 DIAGNOSIS — I10 Essential (primary) hypertension: Secondary | ICD-10-CM | POA: Diagnosis not present

## 2017-11-13 DIAGNOSIS — Z139 Encounter for screening, unspecified: Secondary | ICD-10-CM

## 2017-11-13 MED ORDER — BUTALBITAL-APAP-CAFFEINE 50-325-40 MG PO TABS: ORAL_TABLET | ORAL | 0 refills | Status: DC
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
## 2017-11-13 NOTE — Patient Instructions (Addendum)
Limit your sodium (Salt) intake  Please check your blood pressure on a regular basis.  If it is consistently greater than 150/90, please make an office appointment.    It is important that you exercise regularly, at least 20 minutes 3 to 4 times per week.  If you develop chest pain or shortness of breath seek  medical attention.  Cardiology follow-up as scheduled   

## 2017-11-17 NOTE — Progress Notes (Signed)
HPI: FU palpitations. She has a long history of palpitations and presyncope. Patient apparently noted to have a PFO on transesophageal echocardiogram in 2006 following CVA. Event monitor in 2011 for palpitations showed sinus with PACs. Nuclear study September 2016 showed apical thinning and mild ischemia in the inferior lateral wall. Cardiac catheterization September 2016 showed no obstructive coronary disease and normal LV function. Carotid dopplers 6/18 showed 1-39 bilateral stenosis. Echo 6/18 showed normal LV function. Since last seen,  patient has occasional chest pain.  It is substernal and described as a tightening.  Lasts 1-2 minutes and resolves.  There is associated dyspnea but no nausea or diaphoresis.  Not pleuritic, exertional.  She notes some dyspnea on exertion.  Occasional dizziness.  No syncope.  Current Outpatient Medications  Medication Sig Dispense Refill  . atorvastatin (LIPITOR) 40 MG tablet TAKE 1 TABLET BY MOUTH EVERY DAY 90 tablet 0  . BIOTIN 5000 PO Take 5,000 mcg by mouth daily.    . butalbital-acetaminophen-caffeine (FIORICET, ESGIC) 50-325-40 MG tablet TAKE 1 TABLET BY MOUTH EVERY 6 HOURS AS NEEDED FOR HEADACHE 60 tablet 0  . clopidogrel (PLAVIX) 75 MG tablet Take 1 tablet (75 mg total) by mouth every evening. 90 tablet 1  . levothyroxine (SYNTHROID, LEVOTHROID) 125 MCG tablet TAKE 1 TABLET BY MOUTH DAILY BEFORE BREAKFAST. 90 tablet 0  . lisinopril-hydrochlorothiazide (PRINZIDE,ZESTORETIC) 20-12.5 MG tablet TAKE 1 TABLET BY MOUTH DAILY. 90 tablet 1  . tetrahydrozoline 0.05 % ophthalmic solution Place 2 drops into both eyes as needed (for red eyes). Reported on 02/12/2016    . traMADol (ULTRAM) 50 MG tablet TAKE 1 TABLET BY MOUTH EVERY 6 HOURS AS NEEDED FOR PAIN 50 tablet 0  . verapamil (VERELAN PM) 240 MG 24 hr capsule Take 1 capsule (240 mg total) by mouth at bedtime. 90 capsule 3  . albuterol (PROVENTIL HFA;VENTOLIN HFA) 108 (90 Base) MCG/ACT inhaler Inhale 2  puffs into the lungs every 4 (four) hours as needed for wheezing or shortness of breath. 1 Inhaler 2   No current facility-administered medications for this visit.      Past Medical History:  Diagnosis Date  . Bronchitis 09/2016  . Bruises easily   . DDD (degenerative disc disease)   . Degenerative joint disease of cervical spine 09-05-11   Cervival area, now some osteoarthritis-lower back and Rt. shoulder  . Depression   . Diverticulitis 09-05-11   hx. gastritis, diverticulitis x2 -now surgery planned  . DIVERTICULITIS, ACUTE 02/10/2010  . Essential hypertension 09-05-11   tx. Verapamil  . GROIN PAIN 09/21/2010  . Headache(784.0)    headaches are better  . Hearing loss   . Heart palpitations   . Hemorrhoids   . History of bronchitis   . History of colonic polyps   . History of pneumonia   . History of tension headache   . Hyperlipidemia   . Hypertension   . Hypothyroidism 09-05-11   Supplement used  . Late effect of adverse effect of drug, medicinal or biological substance   . NECK PAIN, ACUTE 09/21/2010  . Nocturia   . Osteoarthritis 112-17-12   spine and rt. hip, rt. shoulder  . Patent foramen ovale    Small - unable to be closed -   . Pneumonia   . PONV (postoperative nausea and vomiting)   . Sleep apnea 09-05-11   no cpap ever, had surgery to remove cartilage, no problems now  . SORE THROAT 04/30/2009  . Stroke [facility]) 09-05-11  2006/2009-(loss of memory, balance issues remains occ.)  . TIA 09/28/2007, 10/2014  . Unstable angina (HCC)    a. 05/2010 Cath: nl cors, EF 55%;  b. 05/2015 Lexiscan MV: small, severe, fixed apical defect and a small, severe, reversible inf lateral defect w/ apical thinning and mild ischemia, EF 54%.  Marland Kitchen URI 09/02/2008  . Vertigo     Past Surgical History:  Procedure Laterality Date  . ABDOMINAL HYSTERECTOMY    . CARDIAC CATHETERIZATION N/A 06/01/2015   Procedure: Left Heart Cath and Coronary Angiography;  Surgeon: Kathleene Hazel,  MD;  Location: [facility] INVASIVE CV LAB;  Service: Cardiovascular;  Laterality: N/A;  . COLON RESECTION  09/07/2011   Procedure: LAPAROSCOPIC SIGMOID COLON RESECTION;  Surgeon: Mariella Saa, MD;  Location: [facility] OR[facility];  Service: General;  Laterality: N/A;  with proctoscopy  . CYST EXCISION N/A 11/29/2016   Procedure: EXCISION UMBILICAL CYST;  Surgeon: Glenna Fellows, MD;  Location: [facility];  Service: General;  Laterality: N/A;  . ELBOW SURGERY  09-05-11   left elbow -ligament repair  . EYE SURGERY     cataract  . INSERTION OF MESH N/A 12/13/2013   Procedure: INSERTION OF MESH;  Surgeon: Mariella Saa, MD;  Location: [facility];  Service: General;  Laterality: N/A;  . LAPAROSCOPY  09/07/2011   Procedure: LAPAROSCOPY DIAGNOSTIC;  Surgeon: Roselle Locus II;  Location: [facility];  Service: Gynecology;  Laterality: N/A;  . MAXIMUM ACCESS (MAS)POSTERIOR LUMBAR INTERBODY FUSION (PLIF) 2 LEVEL N/A 05/03/2016   Procedure: L4-5 L5-S1 Maximum access posterior lumbar interbody fusion;  Surgeon: Maeola Harman, MD;  Location: [facility];  Service: Neurosurgery;  Laterality: N/A;  L4-5 L5-S1 Maximum access posterior lumbar interbody fusion  . SALPINGOOPHORECTOMY  09/07/2011   Procedure: SALPINGO OOPHERECTOMY;  Surgeon: Roselle Locus II;  Location: [facility];  Service: Gynecology;  Laterality: Right;  . THYROIDECTOMY, PARTIAL  09-05-11  . TUBAL LIGATION  1983  . UMBILICAL HERNIA REPAIR  yrs ago  . VENTRAL HERNIA REPAIR  06/20/2012   Procedure: LAPAROSCOPIC VENTRAL HERNIA;  Surgeon: Mariella Saa, MD;  Location: [facility];  Service: General;  Laterality: N/A;  Laparoscopic Repair of Ventral Hernia with mesh  . VENTRAL HERNIA REPAIR N/A 12/13/2013   Procedure: LAPAROSCOPIC REPAIR RECURRENT VENTRAL INCISIONAL  HERNIA;  Surgeon: Mariella Saa, MD;  Location: [facility];  Service: General;  Laterality: N/A;    Social History   Socioeconomic History  . Marital status: Married    Spouse name: Not on file  .  Number of children: Not on file  . Years of education: Not on file  . Highest education level: Not on file  Social Needs  . Financial resource strain: Not on file  . Food insecurity - worry: Not on file  . Food insecurity - inability: Not on file  . Transportation needs - medical: Not on file  . Transportation needs - non-medical: Not on file  Occupational History  . Not on file  Tobacco Use  . Smoking status: Former Smoker    Packs/day: 0.50    Years: 5.00    Pack years: 2.50    Types: Cigarettes    Last attempt to quit: 09/19/1968    Years since quitting: 49.2  . Smokeless tobacco: Never Used  Substance and Sexual Activity  . Alcohol use: Yes    Alcohol/week: 1.2 oz    Types: 1 Cans of beer, 1 Shots of liquor per week    Comment: rarely  .  Drug use: No  . Sexual activity: Yes  Other Topics Concern  . Not on file  Social History Narrative   Lives in [place] with her husband.  She does not routinely exercise.    Family History  Problem Relation Age of Onset  . Arrhythmia Mother   . Hypertension Mother   . Stroke Mother   . Stroke Maternal Grandmother   . Diabetes Paternal Grandmother     ROS: no fevers or chills, productive cough, hemoptysis, dysphasia, odynophagia, melena, hematochezia, dysuria, hematuria, rash, seizure activity, orthopnea, PND, pedal edema, claudication. Remaining systems are negative.  Physical Exam: Well-developed well-nourished in no acute distress.  Skin is warm and dry.  HEENT Ecchymosis over right eye from recent trauma  Neck is supple.  Chest is clear to auscultation with normal expansion.  Cardiovascular exam is regular rate and rhythm.  Abdominal exam nontender or distended. No masses palpated. Extremities show no edema. neuro grossly intact  ECG-sinus rhythm at a rate of 65.  Left ventricular hypertrophy.  Personally reviewed  A/P  1 palpitations-symptoms are reasonably well controlled.  Continue verapamil.  2 hypertension-blood  pressure is controlled.  Continue present medications.  3 chest pain-patient complains of chest pain that is atypical.  Electrocardiogram shows no ST changes.  Previous catheterization revealed no coronary disease.  I will arrange an echocardiogram to assess wall motion.  If normal wall motion we will not pursue further ischemia evaluation.  [name], MD

## 2017-11-20 ENCOUNTER — Other Ambulatory Visit: Payer: Self-pay | Admitting: Internal Medicine

## 2017-11-23 ENCOUNTER — Ambulatory Visit: Payer: Medicare Other | Admitting: Cardiology

## 2017-11-23 ENCOUNTER — Encounter: Payer: Self-pay | Admitting: Cardiology

## 2017-11-23 VITALS — BP 118/70 | HR 65 | Ht 65.5 in | Wt 191.0 lb

## 2017-11-23 DIAGNOSIS — I10 Essential (primary) hypertension: Secondary | ICD-10-CM | POA: Diagnosis not present

## 2017-11-23 DIAGNOSIS — R002 Palpitations: Secondary | ICD-10-CM | POA: Diagnosis not present

## 2017-11-23 DIAGNOSIS — R072 Precordial pain: Secondary | ICD-10-CM | POA: Diagnosis not present

## 2017-11-23 NOTE — Patient Instructions (Signed)

## 2017-12-01 ENCOUNTER — Other Ambulatory Visit (HOSPITAL_COMMUNITY): Payer: Medicare Other

## 2017-12-07 ENCOUNTER — Ambulatory Visit (HOSPITAL_COMMUNITY): Payer: Medicare Other | Attending: Cardiology

## 2017-12-07 ENCOUNTER — Other Ambulatory Visit: Payer: Self-pay

## 2017-12-07 ENCOUNTER — Other Ambulatory Visit: Payer: Self-pay | Admitting: Orthopedic Surgery

## 2017-12-07 DIAGNOSIS — I10 Essential (primary) hypertension: Secondary | ICD-10-CM | POA: Insufficient documentation

## 2017-12-07 DIAGNOSIS — E785 Hyperlipidemia, unspecified: Secondary | ICD-10-CM | POA: Insufficient documentation

## 2017-12-07 DIAGNOSIS — Z8673 Personal history of transient ischemic attack (TIA), and cerebral infarction without residual deficits: Secondary | ICD-10-CM | POA: Insufficient documentation

## 2017-12-07 DIAGNOSIS — R072 Precordial pain: Secondary | ICD-10-CM | POA: Diagnosis not present

## 2017-12-08 ENCOUNTER — Other Ambulatory Visit: Payer: Self-pay

## 2017-12-08 MED ORDER — LEVOTHYROXINE SODIUM 125 MCG PO TABS: ORAL_TABLET | ORAL | 0 refills | Status: DC

## 2017-12-12 ENCOUNTER — Telehealth: Payer: Self-pay | Admitting: Cardiology

## 2017-12-12 ENCOUNTER — Other Ambulatory Visit: Payer: Self-pay | Admitting: Orthopedic Surgery

## 2017-12-12 DIAGNOSIS — M25551 Pain in right hip: Secondary | ICD-10-CM

## 2017-12-12 NOTE — Telephone Encounter (Signed)
Patient aware of results ______________ Notes recorded by Lewayne Buntingrenshaw, Brian S, MD on 12/07/2017 at 5:10 PM EDT Normal LV function Olga MillersBrian Crenshaw

## 2017-12-12 NOTE — Telephone Encounter (Signed)
New Message ° ° °Pt returning call for nurse °

## 2017-12-21 ENCOUNTER — Other Ambulatory Visit: Payer: Self-pay | Admitting: Internal Medicine

## 2017-12-21 ENCOUNTER — Other Ambulatory Visit: Payer: Self-pay

## 2017-12-21 MED ORDER — TRAMADOL HCL 50 MG PO TABS: ORAL_TABLET | ORAL | 0 refills | Status: DC

## 2017-12-24 ENCOUNTER — Other Ambulatory Visit: Payer: Self-pay | Admitting: Internal Medicine

## 2018-01-03 ENCOUNTER — Other Ambulatory Visit: Payer: Medicare Other

## 2018-01-08 ENCOUNTER — Telehealth: Payer: Self-pay

## 2018-01-08 NOTE — Telephone Encounter (Signed)
Clearance form fax to [facility] at [phone]. Form fax and receive twice.

## 2018-01-10 ENCOUNTER — Other Ambulatory Visit: Payer: Self-pay | Admitting: Internal Medicine

## 2018-01-15 ENCOUNTER — Other Ambulatory Visit: Payer: Self-pay | Admitting: Internal Medicine

## 2018-01-16 ENCOUNTER — Other Ambulatory Visit: Payer: Self-pay | Admitting: Internal Medicine

## 2018-01-18 ENCOUNTER — Other Ambulatory Visit: Payer: Self-pay | Admitting: Internal Medicine

## 2018-01-18 DIAGNOSIS — R072 Precordial pain: Secondary | ICD-10-CM

## 2018-03-12 ENCOUNTER — Other Ambulatory Visit: Payer: Self-pay | Admitting: Internal Medicine

## 2018-03-14 NOTE — Telephone Encounter (Signed)
No recent TSH.  Unable to call pt due to phone lines down.

## 2018-03-15 NOTE — Telephone Encounter (Signed)
Okay for refill? Please advise Pt has not had TSH level in a while

## 2018-04-02 ENCOUNTER — Other Ambulatory Visit: Payer: Self-pay | Admitting: Internal Medicine

## 2018-05-14 ENCOUNTER — Other Ambulatory Visit: Payer: Self-pay | Admitting: Internal Medicine

## 2018-05-15 ENCOUNTER — Encounter: Payer: Self-pay | Admitting: Family Medicine

## 2018-05-15 ENCOUNTER — Ambulatory Visit: Payer: Medicare Other | Admitting: Family Medicine

## 2018-05-15 VITALS — BP 100/70 | HR 67 | Temp 98.0°F | Ht 65.5 in | Wt 182.6 lb

## 2018-05-15 DIAGNOSIS — R8290 Unspecified abnormal findings in urine: Secondary | ICD-10-CM | POA: Diagnosis not present

## 2018-05-15 DIAGNOSIS — I6381 Other cerebral infarction due to occlusion or stenosis of small artery: Secondary | ICD-10-CM

## 2018-05-15 DIAGNOSIS — G44209 Tension-type headache, unspecified, not intractable: Secondary | ICD-10-CM | POA: Diagnosis not present

## 2018-05-15 DIAGNOSIS — G8929 Other chronic pain: Secondary | ICD-10-CM

## 2018-05-15 DIAGNOSIS — R739 Hyperglycemia, unspecified: Secondary | ICD-10-CM | POA: Diagnosis not present

## 2018-05-15 DIAGNOSIS — E039 Hypothyroidism, unspecified: Secondary | ICD-10-CM

## 2018-05-15 DIAGNOSIS — M5440 Lumbago with sciatica, unspecified side: Secondary | ICD-10-CM

## 2018-05-15 DIAGNOSIS — I10 Essential (primary) hypertension: Secondary | ICD-10-CM | POA: Diagnosis not present

## 2018-05-15 LAB — CBC WITH DIFFERENTIAL/PLATELET
BASOS ABS: 0 10*3/uL (ref 0.0–0.1)
BASOS PCT: 0.4 % (ref 0.0–3.0)
EOS ABS: 0.1 10*3/uL (ref 0.0–0.7)
Eosinophils Relative: 1.8 % (ref 0.0–5.0)
HEMATOCRIT: 38.8 % (ref 36.0–46.0)
HEMOGLOBIN: 12.9 g/dL (ref 12.0–15.0)
LYMPHS PCT: 36.9 % (ref 12.0–46.0)
Lymphs Abs: 2.1 10*3/uL (ref 0.7–4.0)
MCHC: 33.4 g/dL (ref 30.0–36.0)
MCV: 89.6 fl (ref 78.0–100.0)
Monocytes Absolute: 0.6 10*3/uL (ref 0.1–1.0)
Monocytes Relative: 10.9 % (ref 3.0–12.0)
Neutro Abs: 2.8 10*3/uL (ref 1.4–7.7)
Neutrophils Relative %: 50 % (ref 43.0–77.0)
Platelets: 166 10*3/uL (ref 150.0–400.0)
RBC: 4.33 Mil/uL (ref 3.87–5.11)
RDW: 13.5 % (ref 11.5–15.5)
WBC: 5.6 10*3/uL (ref 4.0–10.5)

## 2018-05-15 LAB — BASIC METABOLIC PANEL
BUN: 13 mg/dL (ref 6–23)
CALCIUM: 10.3 mg/dL (ref 8.4–10.5)
CO2: 28 meq/L (ref 19–32)
Chloride: 105 mEq/L (ref 96–112)
Creatinine, Ser: 0.88 mg/dL (ref 0.40–1.20)
GFR: 80.99 mL/min (ref >60.00)
Glucose, Bld: 88 mg/dL (ref 70–99)
Potassium: 4 mEq/L (ref 3.5–5.1)
Sodium: 141 mEq/L (ref 135–145)

## 2018-05-15 LAB — POCT URINALYSIS DIPSTICK
BILIRUBIN UA: NEGATIVE
Blood, UA: POSITIVE
GLUCOSE UA: NEGATIVE
KETONES UA: NEGATIVE
Leukocytes, UA: NEGATIVE
Nitrite, UA: NEGATIVE
PROTEIN UA: NEGATIVE
SPEC GRAV UA: 1.025 (ref 1.010–1.025)
Urobilinogen, UA: 0.2 E.U./dL
pH, UA: 5.5 (ref 5.0–8.0)

## 2018-05-15 LAB — HEPATIC FUNCTION PANEL
ALT: 12 U/L (ref 0–35)
AST: 13 U/L (ref 0–37)
Albumin: 4.2 g/dL (ref 3.5–5.2)
Alkaline Phosphatase: 102 U/L (ref 39–117)
BILIRUBIN DIRECT: 0.1 mg/dL (ref 0.0–0.3)
TOTAL PROTEIN: 7.5 g/dL (ref 6.0–8.3)
Total Bilirubin: 0.5 mg/dL (ref 0.2–1.2)

## 2018-05-15 LAB — TSH: TSH: 1.52 u[IU]/mL (ref 0.35–4.50)

## 2018-05-15 LAB — LIPID PANEL
Cholesterol: 162 mg/dL (ref 0–200)
HDL: 75.2 mg/dL (ref >39.00)
LDL CALC: 69 mg/dL (ref 0–99)
NONHDL: 86.91
Total CHOL/HDL Ratio: 2
Triglycerides: 88 mg/dL (ref 0.0–149.0)
VLDL: 17.6 mg/dL (ref 0.0–40.0)

## 2018-05-15 LAB — HEMOGLOBIN A1C: Hgb A1c MFr Bld: 6.1 % (ref 4.6–6.5)

## 2018-05-15 MED ORDER — BUTALBITAL-APAP-CAFFEINE 50-325-40 MG PO TABS: ORAL_TABLET | ORAL | 5 refills | Status: DC

## 2018-05-15 NOTE — Progress Notes (Signed)
   Subjective:    Patient ID: Abigail [name], female    DOB: 11/13/1945, 72 y.o.   MRN: [id number]  HPI Here to establish with us after transfering from Dr. Amador CunasKwiatkowski. She would like to have some lab tests today to check her thyroid levels and her glucose. Also she has had frequent urination and a foul odor to the urine for one month. No burning. She has constant low back pain and she wears a lumbar support brace. She walks with a cane. She had spinal fusion surgery to L4-5 and L5-S1 in August 2017 per Dr. Venetia MaxonStern, but this only helped with a part of her back pain. She had a sigmoid colonoscopy due to diverticulitis. Her last colonoscopy in 2016 showed some precancerous polyps, so Dr. Kinnie ScalesMedoff recommended a 5 year repeat. She had a partial thyroidectomy at age 72 due to Graves disease, and she has been on Synthroid since then. She has severe arthritis in the right hip and Dr. Fara Chutealdorff has recommended she have a replacement surgery, but she is postponing this as long as possible. She takes Tramadol for pain relief.    Review of Systems  Constitutional: Negative.   Respiratory: Negative.   Cardiovascular: Negative.   Gastrointestinal: Negative.   Genitourinary: Positive for frequency. Negative for dysuria, flank pain, hematuria and urgency.  Musculoskeletal: Positive for back pain.  Neurological: Negative.        Objective:   Physical Exam  Constitutional: She is oriented to person, place, and time. She appears well-developed and well-nourished.  Neck: No thyromegaly present.  Cardiovascular: Normal rate, regular rhythm, normal heart sounds and intact distal pulses.  Pulmonary/Chest: Effort normal and breath sounds normal. No stridor. No respiratory distress. She has no wheezes. She has no rales.  Abdominal: Soft. Bowel sounds are normal. She exhibits no distension and no mass. There is no tenderness. There is no rebound and no guarding. No hernia.  Lymphadenopathy:    She has no cervical  adenopathy.  Neurological: She is alert and oriented to person, place, and time.          Assessment & Plan:  She has chronic low back and right hip pain, and she uses Tramadol for this. She has hypothyroidism and we will check a TSH today. Check an A1c today. Her UA looks clear but we will send the sample for a culture. Check lipids since she is fasting. Refilled Fioricet for her tension headaches.  Gershon CraneStephen Nelsy Madonna, MD

## 2018-05-16 LAB — URINE CULTURE
MICRO NUMBER:: 91023374
SPECIMEN QUALITY:: ADEQUATE

## 2018-05-28 ENCOUNTER — Ambulatory Visit (INDEPENDENT_AMBULATORY_CARE_PROVIDER_SITE_OTHER): Payer: Medicare Other | Admitting: *Deleted

## 2018-05-28 DIAGNOSIS — Z23 Encounter for immunization: Secondary | ICD-10-CM | POA: Diagnosis not present

## 2018-05-31 ENCOUNTER — Ambulatory Visit: Payer: Medicare Other | Admitting: Family Medicine

## 2018-05-31 ENCOUNTER — Encounter: Payer: Self-pay | Admitting: Family Medicine

## 2018-05-31 VITALS — BP 118/70 | HR 58 | Temp 98.4°F | Ht 65.5 in | Wt 184.6 lb

## 2018-05-31 DIAGNOSIS — I10 Essential (primary) hypertension: Secondary | ICD-10-CM

## 2018-05-31 MED ORDER — VERAPAMIL HCL ER 120 MG PO TBCR: 120.0000 mg | EXTENDED_RELEASE_TABLET | Freq: Every day | ORAL | 2 refills | Status: DC

## 2018-05-31 NOTE — Progress Notes (Signed)
   Subjective:    Patient ID: Abigail [name], female    DOB: 09/15/1946, 72 y.o.   MRN: [id number]  HPI Here for low BP readings. Over this past weekend she had a few days of nausea and vomiting (which has resolved) and her BP went as low as 90/60 at home. She felt very weak and lightheaded. No chest pain or SOB. Since then the BP has come back up some , but I see this has been on the low side for the past year. She often has systolic readings in the 100-110 range. She has been taking Lisinopril HCT 20-12.5 in the mornings and Verapamil CR 240 mg at bedtime.    Review of Systems  Constitutional: Negative.   Respiratory: Negative.   Cardiovascular: Negative.   Neurological: Positive for light-headedness. Negative for dizziness and headaches.       Objective:   Physical Exam  Constitutional: She is oriented to person, place, and time. She appears well-developed and well-nourished.  Cardiovascular: Normal rate, regular rhythm, normal heart sounds and intact distal pulses.  Pulmonary/Chest: Effort normal and breath sounds normal. No stridor. No respiratory distress. She has no wheezes. She has no rales.  Musculoskeletal: She exhibits no edema.  Neurological: She is alert and oriented to person, place, and time.          Assessment & Plan:  Her HTN has been overly treated. We will decrease the Verapamil CR to 120 mg daily. Recheck in 2 weeks.  Gershon CraneStephen Fry, MD

## 2018-06-19 ENCOUNTER — Other Ambulatory Visit: Payer: Self-pay | Admitting: Internal Medicine

## 2018-06-27 ENCOUNTER — Other Ambulatory Visit: Payer: Self-pay | Admitting: Orthopaedic Surgery

## 2018-07-06 ENCOUNTER — Other Ambulatory Visit: Payer: Self-pay | Admitting: Orthopaedic Surgery

## 2018-07-06 ENCOUNTER — Telehealth: Payer: Self-pay | Admitting: *Deleted

## 2018-07-06 NOTE — Telephone Encounter (Signed)
PRIMARY - DR [facility] Pre-operative Risk Assessment    Request for surgical clearance:  1. What type of surgery is being performed?  RIGHT THA /ANTERIOR  2. When is this surgery scheduled? 07/24/2018  3. What type of clearance is required (medical clearance vs. Pharmacy clearance to hold med vs. Both)? MEDICAL  4. Are there any medications that need to be held prior to surgery and how long?PLAVIX  7 DAYS PRIOR TO SURGREY , WHEN CAN MEDICATION RESUME  5. Practice name and name of physician performing surgery? [facility] ;DR PETER DALLDORF  6. What is your office phone number [phone], [place]    7.   What is your office fax number [place]  8.   Anesthesia type (None, local, MAC, general) ?  SPINAL   Raiford Simmonds 07/06/2018, 12:22 PM  _________________________________________________________________   (provider comments below)

## 2018-07-08 ENCOUNTER — Other Ambulatory Visit: Payer: Self-pay | Admitting: Internal Medicine

## 2018-07-09 NOTE — Telephone Encounter (Signed)
Dr Fry pt 

## 2018-07-10 NOTE — Telephone Encounter (Signed)
Ok to hold plavix 7 days prior to procedure and resume after Abigail [name]  

## 2018-07-10 NOTE — Telephone Encounter (Addendum)
   Primary Cardiologist: [name], MD  Chart reviewed as part of pre-operative protocol coverage. Patient was contacted 07/10/2018 in reference to pre-operative risk assessment for pending surgery as outlined below.  Abigail [name] was last seen on 11/23/2017 by Dr. Jens Som.  Since that day, Abigail [name] has done well.  Her verapamil dose had to be reduced for hypertension.  However, her palpitations are controlled and her blood pressure is better on the lower dose of verapamil.  Her activity level is limited by musculoskeletal issues, but not chest pain or shortness of breath.  Previous catheterization showed no obstructive disease and her recent echocardiogram showed a normal EF with no wall motion abnormalities.  Therefore, based on ACC/AHA guidelines, the patient would be at acceptable risk for the planned procedure without further cardiovascular testing.   Dr. Jens Som to address if it is okay to hold her Plavix.  I will route this recommendation to the requesting party via Epic fax function and remove from pre-op pool.  Please call with questions.  Theodore Demark, PA-C 07/10/2018, 4:37 PM

## 2018-07-11 ENCOUNTER — Other Ambulatory Visit: Payer: Self-pay | Admitting: Internal Medicine

## 2018-07-11 ENCOUNTER — Other Ambulatory Visit: Payer: Self-pay | Admitting: Family Medicine

## 2018-07-11 NOTE — Pre-Procedure Instructions (Signed)
Abigail [name]  07/11/2018      [id number] [place], [place] - [address] AT [facility] [address] [place] [address] Phone: [phone] Fax: [phone]    Your procedure is scheduled on November 5th.  Report to [facility] Admitting at 0530 A.M.  Call this number if you have problems the morning of surgery:  [phone]   Remember:  Do not eat or drink after midnight.    Take these medicines the morning of surgery with A SIP OF WATER   atorvastatin (LIPITOR)  levothyroxine (SYNTHROID, LEVOTHROID)  traMADol (ULTRAM)  verapamil (CALAN-SR)   Follow your Surgeon's instructions for when to stop/resume your Plavix.  7 days prior to surgery STOP taking any Aspirin(unless otherwise instructed by your surgeon), Aleve, Naproxen, Ibuprofen, Motrin, Advil, Goody's, BC's, all herbal medications, fish oil, and all vitamins     Do not wear jewelry, make-up or nail polish.  Do not wear lotions, powders, or perfumes, or deodorant.  Do not shave 48 hours prior to surgery.  Men may shave face and neck.  Do not bring valuables to the hospital.  [facility] is not responsible for any belongings or valuables.  Contacts, dentures or bridgework may not be worn into surgery.  Leave your suitcase in the car.  After surgery it may be brought to your room.  For patients admitted to the hospital, discharge time will be determined by your treatment team.  Patients discharged the day of surgery will not be allowed to drive home.    [place]- Preparing For Surgery  Before surgery, you can play an important role. Because skin is not sterile, your skin needs to be as free of germs as possible. You can reduce the number of germs on your skin by washing with CHG (chlorahexidine gluconate) Soap before surgery.  CHG is an antiseptic cleaner which kills germs and bonds with the skin to continue killing germs even after washing.    Oral Hygiene is also  important to reduce your risk of infection.  Remember - BRUSH YOUR TEETH THE MORNING OF SURGERY WITH YOUR REGULAR TOOTHPASTE  Please do not use if you have an allergy to CHG or antibacterial soaps. If your skin becomes reddened/irritated stop using the CHG.  Do not shave (including legs and underarms) for at least 48 hours prior to first CHG shower. It is OK to shave your face.  Please follow these instructions carefully.   1. Shower the NIGHT BEFORE SURGERY and the MORNING OF SURGERY with CHG.   2. If you chose to wash your hair, wash your hair first as usual with your normal shampoo.  3. After you shampoo, rinse your hair and body thoroughly to remove the shampoo.  4. Use CHG as you would any other liquid soap. You can apply CHG directly to the skin and wash gently with a scrungie or a clean washcloth.   5. Apply the CHG Soap to your body ONLY FROM THE NECK DOWN.  Do not use on open wounds or open sores. Avoid contact with your eyes, ears, mouth and genitals (private parts). Wash Face and genitals (private parts)  with your normal soap.  6. Wash thoroughly, paying special attention to the area where your surgery will be performed.  7. Thoroughly rinse your body with warm water from the neck down.  8. DO NOT shower/wash with your normal soap after using and rinsing off the CHG Soap.  9. Dennie Bible  yourself dry with a CLEAN TOWEL.  10. Wear CLEAN PAJAMAS to bed the night before surgery, wear comfortable clothes the morning of surgery  11. Place CLEAN SHEETS on your bed the night of your first shower and DO NOT SLEEP WITH PETS.    Day of Surgery:  Do not apply any deodorants/lotions.  Please wear clean clothes to the hospital/surgery center.   Remember to brush your teeth WITH YOUR REGULAR TOOTHPASTE.    Please read over the following fact sheets that you were given.

## 2018-07-12 ENCOUNTER — Other Ambulatory Visit: Payer: Self-pay

## 2018-07-12 ENCOUNTER — Encounter (HOSPITAL_COMMUNITY): Payer: Self-pay

## 2018-07-12 ENCOUNTER — Ambulatory Visit (HOSPITAL_COMMUNITY)
Admission: RE | Admit: 2018-07-12 | Discharge: 2018-07-12 | Disposition: A | Discharge status: Home / Self Care | Payer: Medicare Other | Source: Ambulatory Visit | Attending: Orthopaedic Surgery | Admitting: Orthopaedic Surgery

## 2018-07-12 ENCOUNTER — Encounter (HOSPITAL_COMMUNITY)
Admission: RE | Admit: 2018-07-12 | Discharge: 2018-07-12 | Disposition: A | Discharge status: Home / Self Care | Payer: Medicare Other | Source: Ambulatory Visit | Attending: Orthopaedic Surgery | Admitting: Orthopaedic Surgery

## 2018-07-12 ENCOUNTER — Other Ambulatory Visit (HOSPITAL_COMMUNITY): Payer: Medicare Other

## 2018-07-12 DIAGNOSIS — Z01818 Encounter for other preprocedural examination: Secondary | ICD-10-CM | POA: Diagnosis present

## 2018-07-12 LAB — APTT: aPTT: 31 seconds (ref 24–36)

## 2018-07-12 LAB — TYPE AND SCREEN
ABO/RH(D): O POS
ANTIBODY SCREEN: NEGATIVE

## 2018-07-12 LAB — BASIC METABOLIC PANEL
Anion gap: 6 (ref 5–15)
BUN: 9 mg/dL (ref 8–23)
CHLORIDE: 106 mmol/L (ref 98–111)
CO2: 26 mmol/L (ref 22–32)
CREATININE: 0.77 mg/dL (ref 0.44–1.00)
Calcium: 9.8 mg/dL (ref 8.9–10.3)
GFR calc Af Amer: >60 mL/min (ref >60)
GFR calc non Af Amer: >60 mL/min (ref >60)
GLUCOSE: 91 mg/dL (ref 70–99)
Potassium: 3 mmol/L — ABNORMAL LOW (ref 3.5–5.1)
Sodium: 138 mmol/L (ref 135–145)

## 2018-07-12 LAB — CBC WITH DIFFERENTIAL/PLATELET
Abs Immature Granulocytes: 0 10*3/uL (ref 0.00–0.07)
BASOS ABS: 0 10*3/uL (ref 0.0–0.1)
Basophils Relative: 1 %
EOS PCT: 2 %
Eosinophils Absolute: 0.1 10*3/uL (ref 0.0–0.5)
HEMATOCRIT: 42.3 % (ref 36.0–46.0)
Hemoglobin: 13.3 g/dL (ref 12.0–15.0)
IMMATURE GRANULOCYTES: 0 %
LYMPHS ABS: 2 10*3/uL (ref 0.7–4.0)
LYMPHS PCT: 42 %
MCH: 29.1 pg (ref 26.0–34.0)
MCHC: 31.4 g/dL (ref 30.0–36.0)
MCV: 92.6 fL (ref 80.0–100.0)
MONOS PCT: 11 %
Monocytes Absolute: 0.5 10*3/uL (ref 0.1–1.0)
NEUTROS PCT: 44 %
Neutro Abs: 2.1 10*3/uL (ref 1.7–7.7)
Platelets: 183 10*3/uL (ref 150–400)
RBC: 4.57 MIL/uL (ref 3.87–5.11)
RDW: 12.5 % (ref 11.5–15.5)
WBC: 4.7 10*3/uL (ref 4.0–10.5)
nRBC: 0 % (ref 0.0–0.2)

## 2018-07-12 LAB — URINALYSIS, ROUTINE W REFLEX MICROSCOPIC
Bacteria, UA: NONE SEEN
Bilirubin Urine: NEGATIVE
GLUCOSE, UA: NEGATIVE mg/dL
Ketones, ur: NEGATIVE mg/dL
NITRITE: NEGATIVE
Protein, ur: NEGATIVE mg/dL
SPECIFIC GRAVITY, URINE: 1.015 (ref 1.005–1.030)
pH: 5 (ref 5.0–8.0)

## 2018-07-12 LAB — PROTIME-INR
INR: 0.96
Prothrombin Time: 12.7 seconds (ref 11.4–15.2)

## 2018-07-12 LAB — SURGICAL PCR SCREEN
MRSA, PCR: NEGATIVE
Staphylococcus aureus: NEGATIVE

## 2018-07-12 NOTE — Pre-Procedure Instructions (Signed)
KENAE LINDQUIST  07/12/2018      [id number] [place], [place] - [address] AT [facility] [address] [place] [address] Phone: [phone] Fax: [phone]    Your procedure is scheduled on November 5th.  Report to [facility] Admitting at 0530 A.M.  Call this number if you have problems the morning of surgery:  [phone]   Remember:  Do not eat or drink after midnight.    Take these medicines the morning of surgery with A SIP OF WATER   atorvastatin (LIPITOR)  levothyroxine (SYNTHROID, LEVOTHROID)  traMADol (ULTRAM)  verapamil (CALAN-SR)   Stop Plavix 7 days prior to surgery  7 days prior to surgery STOP taking any Aspirin(unless otherwise instructed by your surgeon), Aleve, Naproxen, Ibuprofen, Motrin, Advil, Goody's, BC's, all herbal medications, fish oil, and all vitamins     Do not wear jewelry, make-up or nail polish.  Do not wear lotions, powders, or perfumes, or deodorant.  Do not shave 48 hours prior to surgery.  Men may shave face and neck.  Do not bring valuables to the hospital.  [facility] is not responsible for any belongings or valuables.  Contacts, dentures or bridgework may not be worn into surgery.  Leave your suitcase in the car.  After surgery it may be brought to your room.  For patients admitted to the hospital, discharge time will be determined by your treatment team.  Patients discharged the day of surgery will not be allowed to drive home.    [place]- Preparing For Surgery  Before surgery, you can play an important role. Because skin is not sterile, your skin needs to be as free of germs as possible. You can reduce the number of germs on your skin by washing with CHG (chlorahexidine gluconate) Soap before surgery.  CHG is an antiseptic cleaner which kills germs and bonds with the skin to continue killing germs even after washing.    Oral Hygiene is also important to reduce your risk of  infection.  Remember - BRUSH YOUR TEETH THE MORNING OF SURGERY WITH YOUR REGULAR TOOTHPASTE  Please do not use if you have an allergy to CHG or antibacterial soaps. If your skin becomes reddened/irritated stop using the CHG.  Do not shave (including legs and underarms) for at least 48 hours prior to first CHG shower. It is OK to shave your face.  Please follow these instructions carefully.   1. Shower the NIGHT BEFORE SURGERY and the MORNING OF SURGERY with CHG.   2. If you chose to wash your hair, wash your hair first as usual with your normal shampoo.  3. After you shampoo, rinse your hair and body thoroughly to remove the shampoo.  4. Use CHG as you would any other liquid soap. You can apply CHG directly to the skin and wash gently with a scrungie or a clean washcloth.   5. Apply the CHG Soap to your body ONLY FROM THE NECK DOWN.  Do not use on open wounds or open sores. Avoid contact with your eyes, ears, mouth and genitals (private parts). Wash Face and genitals (private parts)  with your normal soap.  6. Wash thoroughly, paying special attention to the area where your surgery will be performed.  7. Thoroughly rinse your body with warm water from the neck down.  8. DO NOT shower/wash with your normal soap after using and rinsing off the CHG Soap.  9. Pat yourself dry with  a CLEAN TOWEL.  10. Wear CLEAN PAJAMAS to bed the night before surgery, wear comfortable clothes the morning of surgery  11. Place CLEAN SHEETS on your bed the night of your first shower and DO NOT SLEEP WITH PETS.    Day of Surgery:  Do not apply any deodorants/lotions.  Please wear clean clothes to the hospital/surgery center.   Remember to brush your teeth WITH YOUR REGULAR TOOTHPASTE.    Please read over the following fact sheets that you were given.

## 2018-07-12 NOTE — Progress Notes (Signed)
PCP  Dr. Gershon Crane  Cardiologist Dr.Brian Crenshaw  Cardiac Clearance note in Epic  07-10-2018  Echo  2019  Stress test  2016  Cath 2016  Pt. To stop Plavix 7 days prior to surgery.

## 2018-07-13 ENCOUNTER — Encounter (HOSPITAL_COMMUNITY): Payer: Self-pay | Admitting: Physician Assistant

## 2018-07-13 NOTE — Anesthesia Preprocedure Evaluation (Deleted)
Anesthesia Evaluation    Airway        Dental   Pulmonary former smoker,           Cardiovascular hypertension,      Neuro/Psych    GI/Hepatic   Endo/Other    Renal/GU      Musculoskeletal   Abdominal   Peds  Hematology   Anesthesia Other Findings   Reproductive/Obstetrics                             Anesthesia Physical Anesthesia Plan  ASA:   Anesthesia Plan:    Post-op Pain Management:    Induction:   PONV Risk Score and Plan:   Airway Management Planned:   Additional Equipment:   Intra-op Plan:   Post-operative Plan:   Informed Consent:   Plan Discussed with:   Anesthesia Plan Comments: (Cardiac clearance 07/10/2018 by Theodore Demark, PA-C.)        Anesthesia Quick Evaluation

## 2018-07-16 ENCOUNTER — Other Ambulatory Visit: Payer: Self-pay | Admitting: Internal Medicine

## 2018-07-23 ENCOUNTER — Encounter: Payer: Self-pay | Admitting: Family Medicine

## 2018-07-23 ENCOUNTER — Telehealth: Payer: Self-pay | Admitting: *Deleted

## 2018-07-23 ENCOUNTER — Ambulatory Visit: Payer: Medicare Other | Admitting: Family Medicine

## 2018-07-23 VITALS — BP 140/88 | HR 67 | Temp 98.4°F | Ht 65.5 in | Wt 176.2 lb

## 2018-07-23 DIAGNOSIS — K5792 Diverticulitis of intestine, part unspecified, without perforation or abscess without bleeding: Secondary | ICD-10-CM

## 2018-07-23 LAB — COMPREHENSIVE METABOLIC PANEL
ALK PHOS: 102 U/L (ref 39–117)
ALT: 10 U/L (ref 0–35)
AST: 13 U/L (ref 0–37)
Albumin: 4.2 g/dL (ref 3.5–5.2)
BILIRUBIN TOTAL: 0.4 mg/dL (ref 0.2–1.2)
BUN: 12 mg/dL (ref 6–23)
CALCIUM: 10.2 mg/dL (ref 8.4–10.5)
CO2: 23 mEq/L (ref 19–32)
Chloride: 106 mEq/L (ref 96–112)
Creatinine, Ser: 0.82 mg/dL (ref 0.40–1.20)
GFR: 87.82 mL/min (ref >60.00)
Glucose, Bld: 90 mg/dL (ref 70–99)
Potassium: 3.7 mEq/L (ref 3.5–5.1)
Sodium: 139 mEq/L (ref 135–145)
TOTAL PROTEIN: 7.9 g/dL (ref 6.0–8.3)

## 2018-07-23 LAB — CBC WITH DIFFERENTIAL/PLATELET
BASOS ABS: 0 10*3/uL (ref 0.0–0.1)
Basophils Relative: 0.3 % (ref 0.0–3.0)
EOS ABS: 0 10*3/uL (ref 0.0–0.7)
Eosinophils Relative: 0.8 % (ref 0.0–5.0)
HEMATOCRIT: 39.1 % (ref 36.0–46.0)
Hemoglobin: 13.3 g/dL (ref 12.0–15.0)
LYMPHS PCT: 38.7 % (ref 12.0–46.0)
Lymphs Abs: 2.1 10*3/uL (ref 0.7–4.0)
MCHC: 34 g/dL (ref 30.0–36.0)
MCV: 88.2 fl (ref 78.0–100.0)
MONOS PCT: 11.6 % (ref 3.0–12.0)
Monocytes Absolute: 0.6 10*3/uL (ref 0.1–1.0)
NEUTROS ABS: 2.7 10*3/uL (ref 1.4–7.7)
Neutrophils Relative %: 48.6 % (ref 43.0–77.0)
Platelets: 183 10*3/uL (ref 150.0–400.0)
RBC: 4.44 Mil/uL (ref 3.87–5.11)
RDW: 12.8 % (ref 11.5–15.5)
WBC: 5.5 10*3/uL (ref 4.0–10.5)

## 2018-07-23 MED ORDER — TRANEXAMIC ACID-NACL 1000-0.7 MG/100ML-% IV SOLN
1000.0000 mg | INTRAVENOUS | Status: DC
  Filled 2018-07-23: qty 100

## 2018-07-23 MED ORDER — CIPROFLOXACIN HCL 500 MG PO TABS: 500.0000 mg | ORAL_TABLET | Freq: Two times a day (BID) | ORAL | 0 refills | Status: DC

## 2018-07-23 MED ORDER — BUPIVACAINE LIPOSOME 1.3 % IJ SUSP
10.0000 mL | INTRAMUSCULAR | Status: DC
  Filled 2018-07-23: qty 10

## 2018-07-23 MED ORDER — METRONIDAZOLE 500 MG PO TABS: 500.0000 mg | ORAL_TABLET | Freq: Three times a day (TID) | ORAL | 0 refills | Status: DC

## 2018-07-23 MED ORDER — VANCOMYCIN HCL IN DEXTROSE 1-5 GM/200ML-% IV SOLN: 1000.0000 mg | INTRAVENOUS | Status: DC

## 2018-07-23 MED ORDER — TRANEXAMIC ACID 1000 MG/10ML IV SOLN
2000.0000 mg | INTRAVENOUS | Status: DC
  Filled 2018-07-23: qty 20

## 2018-07-23 NOTE — Telephone Encounter (Signed)
Spoke with patient and she has been scheduled with Dr Artis Flock today.  She has called her surgeon and was advised to see a provider and start antibiotics before surgery.

## 2018-07-23 NOTE — Patient Instructions (Signed)

## 2018-07-23 NOTE — H&P (Signed)
TOTAL HIP ADMISSION H&P  Patient is admitted for right total hip arthroplasty.  Subjective:  Chief Complaint: right hip pain  HPI: Abigail [name], 72 y.o. female, has a history of pain and functional disability in the right hip(s) due to arthritis and patient has failed non-surgical conservative treatments for greater than 12 weeks to include NSAID's and/or analgesics, corticosteriod injections, flexibility and strengthening excercises, weight reduction as appropriate and activity modification.  Onset of symptoms was gradual starting 5 years ago with gradually worsening course since that time.The patient noted no past surgery on the right hip(s).  Patient currently rates pain in the right hip at 10 out of 10 with activity. Patient has night pain, worsening of pain with activity and weight bearing, trendelenberg gait, pain that interfers with activities of daily living and crepitus. Patient has evidence of subchondral cysts, subchondral sclerosis, periarticular osteophytes and joint space narrowing by imaging studies. This condition presents safety issues increasing the risk of falls. There is no current active infection.  Patient Active Problem List   Diagnosis Date Noted  . HTN (hypertension) 05/15/2018  . PFO (patent foramen ovale)   . Acute CVA (cerebrovascular accident) (HCC) 02/23/2017  . Right facial numbness 02/23/2017  . Chronic back pain 02/23/2017  . History of partial thyroidectomy 02/23/2017  . Spondylolisthesis of lumbar region 05/03/2016  . Dizziness and giddiness 06/04/2015  . Right-sided lacunar infarction (HCC) 06/04/2015  . Lacunar ataxic hemiparesis (HCC) 06/03/2015  . Unstable angina (HCC)   . Chest pain 05/07/2015  . Tick bite of right forearm 09/04/2014  . Ventral incisional hernia 12/13/2013  . Benign microscopic hematuria 03/05/2012  . Diverticulitis of sigmoid colon 05/04/2011  . PRECORDIAL PAIN 06/08/2010  . PALPITATIONS 05/27/2010  . CERVICAL LYMPHADENOPATHY,  RIGHT 05/15/2008  . Headache 02/28/2008  . TENSION HEADACHE 02/12/2008  . Sebaceous cyst 12/11/2007  . PROBLEMS WITH SWALLOWING AND MASTICATION 11/20/2007  . COLONIC POLYPS, HX OF 07/23/2007  . Hypothyroidism 03/06/2007  . Hyperlipidemia 03/06/2007  . Essential hypertension 03/06/2007  . History of cardiovascular disorder 03/06/2007   Past Medical History:  Diagnosis Date  . Bronchitis 09/2016  . Bruises easily   . DDD (degenerative disc disease)   . Degenerative joint disease of cervical spine 09-05-11   Cervival area, now some osteoarthritis-lower back and Rt. shoulder  . Depression   . Diverticulitis 09-05-11   hx. gastritis, diverticulitis x2 -now surgery planned  . DIVERTICULITIS, ACUTE 02/10/2010  . Essential hypertension 09-05-11   tx. Verapamil  . GROIN PAIN 09/21/2010  . Headache(784.0)    headaches are better  . Hearing loss    right ear  . Heart palpitations   . Hemorrhoids   . History of bronchitis   . History of colonic polyps   . History of pneumonia   . History of tension headache   . Hyperlipidemia   . Hypertension   . Hypothyroidism 09-05-11   Supplement used  . Late effect of adverse effect of drug, medicinal or biological substance   . NECK PAIN, ACUTE 09/21/2010  . Nocturia   . Osteoarthritis 112-17-12   spine and rt. hip, rt. shoulder  . Patent foramen ovale    Small - unable to be closed -   . Pneumonia   . PONV (postoperative nausea and vomiting)   . Sleep apnea 09-05-11   no cpap ever, had surgery to remove cartilage, no problems now  . SORE THROAT 04/30/2009  . Stroke (HCC) 09-05-11   2006/2009-(loss of memory, balance issues  remains occ.)  . TIA 09/28/2007, 10/2014  . Unstable angina (HCC)    a. 05/2010 Cath: nl cors, EF 55%;  b. 05/2015 Lexiscan MV: small, severe, fixed apical defect and a small, severe, reversible inf lateral defect w/ apical thinning and mild ischemia, EF 54%.  Marland Kitchen URI 09/02/2008  . Vertigo     Past Surgical History:   Procedure Laterality Date  . ABDOMINAL HYSTERECTOMY    . CARDIAC CATHETERIZATION N/A 06/01/2015   Procedure: Left Heart Cath and Coronary Angiography;  Surgeon: Kathleene Hazel, MD;  Location: [facility];  Service: Cardiovascular;  Laterality: N/A;  . COLON RESECTION  09/07/2011   Procedure: LAPAROSCOPIC SIGMOID COLON RESECTION;  Surgeon: Mariella Saa, MD;  Location: [facility];  Service: General;  Laterality: N/A;  with proctoscopy  . COLONOSCOPY  08/06/2015   per Dr. Kinnie Scales, adenomatous polyps, repeat in 5 yrs   . CYST EXCISION N/A 11/29/2016   Procedure: EXCISION UMBILICAL CYST;  Surgeon: Glenna Fellows, MD;  Location: [facility];  Service: General;  Laterality: N/A;  . ELBOW SURGERY  09-05-11   left elbow -ligament repair  . EYE SURGERY     cataract  . INSERTION OF MESH N/A 12/13/2013   Procedure: INSERTION OF MESH;  Surgeon: Mariella Saa, MD;  Location: [facility];  Service: General;  Laterality: N/A;  . LAPAROSCOPY  09/07/2011   Procedure: LAPAROSCOPY DIAGNOSTIC;  Surgeon: Roselle Locus II;  Location: [facility];  Service: Gynecology;  Laterality: N/A;  . MAXIMUM ACCESS (MAS)POSTERIOR LUMBAR INTERBODY FUSION (PLIF) 2 LEVEL N/A 05/03/2016   Procedure: L4-5 L5-S1 Maximum access posterior lumbar interbody fusion;  Surgeon: Maeola Harman, MD;  Location: [facility];  Service: Neurosurgery;  Laterality: N/A;  L4-5 L5-S1 Maximum access posterior lumbar interbody fusion  . SALPINGOOPHORECTOMY  09/07/2011   Procedure: SALPINGO OOPHERECTOMY;  Surgeon: Roselle Locus II;  Location: [facility];  Service: Gynecology;  Laterality: Right;  . THYROIDECTOMY, PARTIAL  09-05-11  . TUBAL LIGATION  1983  . UMBILICAL HERNIA REPAIR  yrs ago  . VENTRAL HERNIA REPAIR  06/20/2012   Procedure: LAPAROSCOPIC VENTRAL HERNIA;  Surgeon: Mariella Saa, MD;  Location: [facility];  Service: General;  Laterality: N/A;  Laparoscopic Repair of Ventral Hernia with mesh  . VENTRAL HERNIA REPAIR N/A 12/13/2013    Procedure: LAPAROSCOPIC REPAIR RECURRENT VENTRAL INCISIONAL  HERNIA;  Surgeon: Mariella Saa, MD;  Location: [facility];  Service: General;  Laterality: N/A;    No current facility-administered medications for this encounter.    Current Outpatient Medications  Medication Sig Dispense Refill Last Dose  . atorvastatin (LIPITOR) 40 MG tablet TAKE 1 TABLET BY MOUTH EVERY DAY (Patient taking differently: Take 40 mg by mouth daily. ) 90 tablet 1 Taking  . butalbital-acetaminophen-caffeine (FIORICET, ESGIC) 50-325-40 MG tablet TAKE 1 TABLET BY MOUTH EVERY 6 HOURS AS NEEDED FOR HEADACHE (Patient taking differently: Take 1 tablet by mouth every 6 (six) hours as needed for headache. TAKE 1 TABLET BY MOUTH EVERY 6 HOURS AS NEEDED FOR HEADACHE) 60 tablet 5 Taking  . levothyroxine (SYNTHROID, LEVOTHROID) 125 MCG tablet TAKE 1 TABLET BY MOUTH EVERY DAY BEFORE BREAKFAST (Patient taking differently: Take 125 mcg by mouth daily before breakfast. TAKE 1 TABLET BY MOUTH EVERY DAY BEFORE BREAKFAST) 90 tablet 0   . Menthol, Topical Analgesic, (ASPERCREME MAX ROLL-ON EX) Apply 1 application topically daily as needed (back pain).     . traMADol (ULTRAM) 50 MG tablet TAKE 1 TO 2  TABLETS BY MOUTH EVERY 4 TO 6 HOURS AS NEEDED FOR PAIN (Patient taking differently: Take 100 mg by mouth every 12 (twelve) hours as needed for severe pain. TAKE 1 TO 2 TABLETS BY MOUTH EVERY 4 TO 6 HOURS AS NEEDED FOR PAIN) 40 tablet 0 Taking  . verapamil (CALAN-SR) 120 MG CR tablet Take 1 tablet (120 mg total) by mouth at bedtime. 30 tablet 2   . clopidogrel (PLAVIX) 75 MG tablet TAKE 1 TABLET BY MOUTH ONCE A DAY 90 tablet 1   . lisinopril-hydrochlorothiazide (PRINZIDE,ZESTORETIC) 20-12.5 MG tablet TAKE 1 TABLET BY MOUTH DAILY. 90 tablet 2    Allergies  Allergen Reactions  . Dilaudid [Hydromorphone Hcl] Shortness Of Breath, Nausea And Vomiting and Other (See Comments)    Able to take morphine without issue  . Shellfish Allergy Shortness Of  Breath and Rash    Only shrimp allergy  . Aggrenox [Aspirin-Dipyridamole Er] Nausea And Vomiting and Other (See Comments)    Headache   . Cephalexin Itching and Rash  . Codeine Phosphate Nausea And Vomiting  . Contrast Media [Iodinated Diagnostic Agents] Nausea And Vomiting  . Hydrocodone Nausea And Vomiting and Other (See Comments)    Can take morphine without issue  . Meperidine Hcl Nausea And Vomiting  . Percocet [Oxycodone-Acetaminophen] Nausea And Vomiting and Other (See Comments)    Can take morphine without issue    Social History   Tobacco Use  . Smoking status: Former Smoker    Packs/day: 0.50    Years: 5.00    Pack years: 2.50    Types: Cigarettes    Last attempt to quit: 09/19/1968    Years since quitting: 49.8  . Smokeless tobacco: Never Used  Substance Use Topics  . Alcohol use: Yes    Alcohol/week: 2.0 standard drinks    Types: 1 Cans of beer, 1 Shots of liquor per week    Comment: rarely    Family History  Problem Relation Age of Onset  . Arrhythmia Mother   . Hypertension Mother   . Stroke Mother   . Stroke Maternal Grandmother   . Diabetes Paternal Grandmother      Review of Systems  Musculoskeletal: Positive for joint pain.       Right hip  All other systems reviewed and are negative.   Objective:  Physical Exam  Constitutional: She is oriented to person, place, and time. She appears well-developed and well-nourished.  HENT:  Head: Normocephalic and atraumatic.  Eyes: Pupils are equal, round, and reactive to light.  Neck: Normal range of motion.  Cardiovascular: Normal rate and regular rhythm.  Respiratory: Effort normal.  GI: Soft.  Musculoskeletal:  Right hip motion is limited and extremely painful in internal rotation.  Her leg lengths are equal.  She has a healed scar on her back.  Abdominal exam is benign.  Sensation and motor function are intact distally with palpable pulses in her feet.  Neurological: She is alert and oriented to  person, place, and time.  Skin: Skin is warm and dry.  Psychiatric: She has a normal mood and affect. Her behavior is normal. Judgment and thought content normal.    Vital signs in last 24 hours:    Labs:   Estimated body mass index is 29.99 kg/m as calculated from the following:   Height as of 07/12/18: 5' 5.5" (1.664 m).   Weight as of 07/12/18: 83 kg.   Imaging Review Plain radiographs demonstrate severe degenerative joint disease of the right hip(s).  The bone quality appears to be good for age and reported activity level.    Preoperative templating of the joint replacement has been completed, documented, and submitted to the Operating Room personnel in order to optimize intra-operative equipment management.     Assessment/Plan:  End stage primary arthritis, right hip(s)  The patient history, physical examination, clinical judgement of the provider and imaging studies are consistent with end stage degenerative joint disease of the right hip(s) and total hip arthroplasty is deemed medically necessary. The treatment options including medical management, injection therapy, arthroscopy and arthroplasty were discussed at length. The risks and benefits of total hip arthroplasty were presented and reviewed. The risks due to aseptic loosening, infection, stiffness, dislocation/subluxation,  thromboembolic complications and other imponderables were discussed.  The patient acknowledged the explanation, agreed to proceed with the plan and consent was signed. Patient is being admitted for inpatient treatment for surgery, pain control, PT, OT, prophylactic antibiotics, VTE prophylaxis, progressive ambulation and ADL's and discharge planning.The patient is planning to be discharged home with home health services

## 2018-07-23 NOTE — Telephone Encounter (Signed)
Copied from CRM [id number]. Topic: General - Other >> Jul 23, 2018  8:28 AM Jaquita Rector A wrote: Reason for CRM: Patient called in to see Dr Clent Ridges but due to a full schedule I was not able to put her on to see him. Patient stated that she is having a flare up of Diverticulitis and need to be seen. She is also having hip replacement surgery tomorrow and does not want to reschedule so she said she need to see Dr Clent Ridges today. Please advise. Ph# [phone]

## 2018-07-23 NOTE — Progress Notes (Signed)
Patient: Abigail [name] MRN: [id number] DOB: 05-22-1946 PCP: Nelwyn Salisbury, MD     Subjective:  Chief Complaint  Patient presents with  . diverticulitis flare up    HPI: The patient is a 72 y.o. female who presents today for abdominal pain and diarrhea. She states she has a  possible diverticulitis flair. She states she had a colon resection for diverticulitis about 3 years ago. She has had about 3-4 flairs since her surgery. She states that it feels like a typical flair to her. She can not recall eating anything that could have started this flair. She started to have symptoms on Saturday (diarrhea) and didn't really eat anything. Yesterday she tried chicken and rice soup, but still had diarrhea. She is unsure if she had blood in her stool. She has no fever, but does have chills. She has pain in her left lower quadrant. Pain rated as a 9/10 and is constant. Has been taking imodium prn.   Review of Systems  Constitutional: Positive for chills. Negative for fatigue and fever.  Respiratory: Positive for shortness of breath.   Gastrointestinal: Positive for abdominal pain, diarrhea and nausea. Negative for blood in stool and vomiting.  Neurological: Positive for headaches. Negative for dizziness.    Allergies Patient is allergic to dilaudid [hydromorphone hcl]; shellfish allergy; aggrenox [aspirin-dipyridamole er]; cephalexin; codeine phosphate; contrast media [iodinated diagnostic agents]; hydrocodone; meperidine hcl; and percocet [oxycodone-acetaminophen].  Past Medical History Patient  has a past medical history of Bronchitis (09/2016), Bruises easily, DDD (degenerative disc disease), Degenerative joint disease of cervical spine (09-05-11), Depression, Diverticulitis (09-05-11), DIVERTICULITIS, ACUTE (02/10/2010), Essential hypertension (09-05-11), GROIN PAIN (09/21/2010), Headache(784.0), Hearing loss, Heart palpitations, Hemorrhoids, History of bronchitis, History of colonic polyps, History of  pneumonia, History of tension headache, Hyperlipidemia, Hypertension, Hypothyroidism (09-05-11), Late effect of adverse effect of drug, medicinal or biological substance, NECK PAIN, ACUTE (09/21/2010), Nocturia, Osteoarthritis (112-17-12), Patent foramen ovale, Pneumonia, PONV (postoperative nausea and vomiting), Sleep apnea (09-05-11), SORE THROAT (04/30/2009), Stroke (HCC) (09-05-11), TIA (09/28/2007, 10/2014), Unstable angina (HCC), URI (09/02/2008), and Vertigo.  Surgical History Patient  has a past surgical history that includes Abdominal hysterectomy; Thyroidectomy, partial (09-05-11); Elbow surgery (09-05-11); Umbilical hernia repair (yrs ago); Colon resection (09/07/2011); laparoscopy (09/07/2011); Salpingoophorectomy (09/07/2011); Tubal ligation (1983); Ventral hernia repair (06/20/2012); Ventral hernia repair (N/A, 12/13/2013); Insertion of mesh (N/A, 12/13/2013); Cardiac catheterization (N/A, 06/01/2015); Eye surgery; Maximum access (mas)posterior lumbar interbody fusion (plif) 2 level (N/A, 05/03/2016); Cyst excision (N/A, 11/29/2016); and Colonoscopy (08/06/2015).  Family History Pateint's family history includes Arrhythmia in her mother; Diabetes in her paternal grandmother; Hypertension in her mother; Stroke in her maternal grandmother and mother.  Social History Patient  reports that she quit smoking about 49 years ago. Her smoking use included cigarettes. She has a 2.50 pack-year smoking history. She has never used smokeless tobacco. She reports that she drinks about 2.0 standard drinks of alcohol per week. She reports that she does not use drugs.    Objective: Vitals:   07/23/18 1436  BP: 140/88  Pulse: 67  Temp: 98.4 F (36.9 C)  TempSrc: Oral  SpO2: 97%  Weight: 176 lb 3.2 oz (79.9 kg)  Height: 5' 5.5" (1.664 m)    Body mass index is 28.88 kg/m.  Physical Exam  Constitutional: She appears well-developed and well-nourished.  Neck: Normal range of motion. Neck supple.   Cardiovascular: Normal rate, regular rhythm and normal heart sounds.  Pulmonary/Chest: Effort normal and breath sounds normal.  Abdominal: Soft. Bowel sounds are normal.  There is tenderness (LLQ. slight guarding).  Skin: Skin is warm and dry.  Vitals reviewed.      Assessment/plan: 1. Diverticulitis Checking cbc/cmp. Putting her on liquid diet x 2-3 days and staring cipro/flagyl. After she starts back food, would increase fiber. Any blood in stool, fevers or worsening pain they are to go to ED.  - CBC with Differential/Platelet - Comprehensive metabolic panel      Return if symptoms worsen or fail to improve.   Orland Mustard, MD [place] [facility]   07/23/2018

## 2018-07-23 NOTE — Telephone Encounter (Signed)
Dr Fry please advise. thanks 

## 2018-07-23 NOTE — Telephone Encounter (Signed)
Per Dr. Clent Ridges--  He is totally full and she should be seen by someone today or she can schedule to see him for tomorrow.  She needs to notify her surgeon about the flare, as they will probably have to reschedule her surgery.  Thanks

## 2018-07-24 ENCOUNTER — Inpatient Hospital Stay (HOSPITAL_COMMUNITY): Admission: RE | Admit: 2018-07-24 | Payer: Medicare Other | Source: Ambulatory Visit | Admitting: Orthopaedic Surgery

## 2018-07-24 ENCOUNTER — Encounter (HOSPITAL_COMMUNITY): Admission: RE | Payer: Self-pay | Source: Ambulatory Visit

## 2018-07-24 SURGERY — ARTHROPLASTY, HIP, TOTAL, ANTERIOR APPROACH
Anesthesia: Spinal | Site: Hip | Laterality: Right

## 2018-07-25 ENCOUNTER — Other Ambulatory Visit: Payer: Self-pay | Admitting: Orthopaedic Surgery

## 2018-08-02 ENCOUNTER — Other Ambulatory Visit: Payer: Self-pay

## 2018-08-02 MED ORDER — ATORVASTATIN CALCIUM 40 MG PO TABS: 40.0000 mg | ORAL_TABLET | Freq: Every day | ORAL | 0 refills | Status: DC

## 2018-08-06 ENCOUNTER — Encounter: Payer: Self-pay | Admitting: Family Medicine

## 2018-08-06 ENCOUNTER — Other Ambulatory Visit: Payer: Self-pay | Admitting: Orthopaedic Surgery

## 2018-08-06 ENCOUNTER — Ambulatory Visit: Payer: Medicare Other | Admitting: Family Medicine

## 2018-08-06 VITALS — BP 132/76 | HR 57 | Temp 98.2°F | Wt 182.4 lb

## 2018-08-06 DIAGNOSIS — I10 Essential (primary) hypertension: Secondary | ICD-10-CM

## 2018-08-06 NOTE — Progress Notes (Signed)
   Subjective:    Patient ID: Abigail [name], female    DOB: 04/10/1946, 72 y.o.   MRN: [id number]  HPI Here to follow up on HTN. She had decreased the Verapamil to 120 mg daily. Then she was having light headed spells and she stopped taking Lisinopril HCT. She has been off this for thew past  2 weeks, and she feels much better. She now feels back to her baseline. Her BP has been stable.    Review of Systems  Constitutional: Negative.   Respiratory: Negative.   Neurological: Negative.        Objective:   Physical Exam  Constitutional: She is oriented to person, place, and time. She appears well-developed and well-nourished.  Cardiovascular: Normal rate, regular rhythm, normal heart sounds and intact distal pulses.  Pulmonary/Chest: Effort normal and breath sounds normal.  Musculoskeletal: She exhibits no edema.  Neurological: She is alert and oriented to person, place, and time.          Assessment & Plan:  Her HTN is stable on Verapamil alone. She will stay on this regimen.  Gershon CraneStephen Elianys Conry, MD

## 2018-08-08 ENCOUNTER — Other Ambulatory Visit: Payer: Self-pay | Admitting: Orthopaedic Surgery

## 2018-08-09 NOTE — Pre-Procedure Instructions (Addendum)
Abigail [name]  08/09/2018      [id number] [place] - [address] [facility] [address] [place] [address] Phone: [phone] Fax: [phone]    Your procedure is scheduled on Tues., Dec. 3, 2019 from 12:25PM-2:22PM  [facility]A" at 10:25AM  Call this number if you have problems the morning of surgery:  [phone]   Remember:  Do not eat or drink after midnight on Dec. 2nd    Take these medicines the morning of surgery with A SIP OF WATER: Levothyroxine (SYNTHROID, LEVOTHROID)   If needed: Tetrahydrozoline eye drops and TraMADol (ULTRAM)  Stop taking Plavix 7 days (08/14/18) prior to surgery.  7 days before surgery (08/14/18), stop taking all Other Aspirin Products, Vitamins, Fish oils, and Herbal medications. Also stop all NSAIDS i.e. Advil, Ibuprofen, Motrin, Aleve, Anaprox, Naproxen, BC, Goody Powders, and all Supplements.    Do not wear jewelry, make-up or nail polish.  Do not wear lotions, powders, or perfumes, or deodorant.  Do not shave 48 hours prior to surgery.    Do not bring valuables to the hospital.  [facility] not [facility] any belongings or valuables.  Contacts, dentures or bridgework may not be worn into surgery.  Leave your suitcase in the car.  After surgery it may be brought to your room.  For patients admitted to the hospital, discharge time will be determined by your treatment team.  Patients discharged the day of surgery will not be allowed to drive home.   Special instructions:  [facility]- Preparing For Surgery  Before surgery, you can play an important role. Because skin is not sterile, your skin needs to be as free of germs as possible. You can reduce the number of germs on your skin by washing with CHG (chlorahexidine gluconate) Soap before surgery.  CHG is an antiseptic cleaner which kills germs and bonds with the skin to continue killing  germs even after washing.    Oral Hygiene is also important to reduce your risk of infection.  Remember - BRUSH YOUR TEETH THE MORNING OF SURGERY WITH YOUR REGULAR TOOTHPASTE  Please do not use if you have an allergy to CHG or antibacterial soaps. If your skin becomes reddened/irritated stop using the CHG.  Do not shave (including legs and underarms) for at least 48 hours prior to first CHG shower. It is OK to shave your face.  Please follow these instructions carefully.   1. Shower the NIGHT BEFORE SURGERY and the MORNING OF SURGERY with CHG.   2. If you chose to wash your hair, wash your hair first as usual with your normal shampoo.  3. After you shampoo, rinse your hair and body thoroughly to remove the shampoo.  4. Use CHG as you would any other liquid soap. You can apply CHG directly to the skin and wash gently with a scrungie or a clean washcloth.   5. Apply the CHG Soap to your body ONLY FROM THE NECK DOWN.  Do not use on open wounds or open sores. Avoid contact with your eyes, ears, mouth and genitals (private parts). Wash Face and genitals (private parts)  with your normal soap.  6. Wash thoroughly, paying special attention to the area where your surgery will be performed.  7. Thoroughly rinse your body with warm water from the neck down.  8. DO NOT shower/wash with your normal soap after using and rinsing off  the CHG Soap.  9. Pat yourself dry with a CLEAN TOWEL.  10. Wear CLEAN PAJAMAS to bed the night before surgery, wear comfortable clothes the morning of surgery  11. Place CLEAN SHEETS on your bed the night of your first shower and DO NOT SLEEP WITH PETS.  Day of Surgery:  Do not apply any deodorants/lotions.  Please wear clean clothes to the hospital/surgery center.   Remember to brush your teeth WITH YOUR REGULAR TOOTHPASTE.  Please read over the following fact sheets that you were given. Pain Booklet, Coughing and Deep Breathing, MRSA Information and Surgical  Site Infection Prevention

## 2018-08-10 ENCOUNTER — Encounter (HOSPITAL_COMMUNITY)
Admission: RE | Admit: 2018-08-10 | Discharge: 2018-08-10 | Disposition: A | Discharge status: Home / Self Care | Payer: Medicare Other | Source: Ambulatory Visit | Attending: Orthopaedic Surgery | Admitting: Orthopaedic Surgery

## 2018-08-10 ENCOUNTER — Ambulatory Visit: Payer: Self-pay

## 2018-08-10 ENCOUNTER — Other Ambulatory Visit: Payer: Self-pay

## 2018-08-10 ENCOUNTER — Encounter (HOSPITAL_COMMUNITY): Payer: Self-pay

## 2018-08-10 DIAGNOSIS — E039 Hypothyroidism, unspecified: Secondary | ICD-10-CM | POA: Diagnosis not present

## 2018-08-10 DIAGNOSIS — Z8673 Personal history of transient ischemic attack (TIA), and cerebral infarction without residual deficits: Secondary | ICD-10-CM | POA: Insufficient documentation

## 2018-08-10 DIAGNOSIS — Z79899 Other long term (current) drug therapy: Secondary | ICD-10-CM | POA: Diagnosis not present

## 2018-08-10 DIAGNOSIS — I10 Essential (primary) hypertension: Secondary | ICD-10-CM | POA: Insufficient documentation

## 2018-08-10 DIAGNOSIS — Z01812 Encounter for preprocedural laboratory examination: Secondary | ICD-10-CM | POA: Insufficient documentation

## 2018-08-10 DIAGNOSIS — G709 Myoneural disorder, unspecified: Secondary | ICD-10-CM | POA: Insufficient documentation

## 2018-08-10 DIAGNOSIS — E785 Hyperlipidemia, unspecified: Secondary | ICD-10-CM | POA: Insufficient documentation

## 2018-08-10 DIAGNOSIS — Z9889 Other specified postprocedural states: Secondary | ICD-10-CM | POA: Diagnosis not present

## 2018-08-10 HISTORY — DX: Myoneural disorder, unspecified: G70.9

## 2018-08-10 HISTORY — DX: Dyspnea, unspecified: R06.00

## 2018-08-10 LAB — PROTIME-INR
INR: 0.98
PROTHROMBIN TIME: 12.9 s (ref 11.4–15.2)

## 2018-08-10 LAB — CBC WITH DIFFERENTIAL/PLATELET
ABS IMMATURE GRANULOCYTES: 0.01 10*3/uL (ref 0.00–0.07)
BASOS PCT: 1 %
Basophils Absolute: 0 10*3/uL (ref 0.0–0.1)
Eosinophils Absolute: 0.1 10*3/uL (ref 0.0–0.5)
Eosinophils Relative: 2 %
HCT: 41.1 % (ref 36.0–46.0)
HEMOGLOBIN: 13.1 g/dL (ref 12.0–15.0)
IMMATURE GRANULOCYTES: 0 %
LYMPHS PCT: 32 %
Lymphs Abs: 1.5 10*3/uL (ref 0.7–4.0)
MCH: 29.5 pg (ref 26.0–34.0)
MCHC: 31.9 g/dL (ref 30.0–36.0)
MCV: 92.6 fL (ref 80.0–100.0)
MONO ABS: 0.5 10*3/uL (ref 0.1–1.0)
MONOS PCT: 10 %
NEUTROS ABS: 2.5 10*3/uL (ref 1.7–7.7)
NEUTROS PCT: 55 %
PLATELETS: 175 10*3/uL (ref 150–400)
RBC: 4.44 MIL/uL (ref 3.87–5.11)
RDW: 13 % (ref 11.5–15.5)
WBC: 4.6 10*3/uL (ref 4.0–10.5)
nRBC: 0 % (ref 0.0–0.2)

## 2018-08-10 LAB — TYPE AND SCREEN
ABO/RH(D): O POS
ANTIBODY SCREEN: NEGATIVE

## 2018-08-10 LAB — BASIC METABOLIC PANEL
ANION GAP: 8 (ref 5–15)
BUN: 9 mg/dL (ref 8–23)
CHLORIDE: 109 mmol/L (ref 98–111)
CO2: 24 mmol/L (ref 22–32)
Calcium: 9.4 mg/dL (ref 8.9–10.3)
Creatinine, Ser: 0.74 mg/dL (ref 0.44–1.00)
GFR calc Af Amer: >60 mL/min (ref >60)
GFR calc non Af Amer: >60 mL/min (ref >60)
GLUCOSE: 97 mg/dL (ref 70–99)
POTASSIUM: 3.3 mmol/L — AB (ref 3.5–5.1)
Sodium: 141 mmol/L (ref 135–145)

## 2018-08-10 LAB — URINALYSIS, ROUTINE W REFLEX MICROSCOPIC
Bilirubin Urine: NEGATIVE
Glucose, UA: NEGATIVE mg/dL
Ketones, ur: NEGATIVE mg/dL
NITRITE: NEGATIVE
PROTEIN: NEGATIVE mg/dL
SPECIFIC GRAVITY, URINE: 1.023 (ref 1.005–1.030)
pH: 6 (ref 5.0–8.0)

## 2018-08-10 LAB — APTT: aPTT: 32 seconds (ref 24–36)

## 2018-08-10 NOTE — Progress Notes (Addendum)
Cardiologist Dr. Olga MillersBrian Crenshaw  Cardiac clearance 07-10-2018 Epic  Echo 12-07-2017 Stress test 05-21-2018 Cath 05-31-2018  To stop Plavix 7 days prior to surgery last dose 08-14-2018  PCP Dr. Larey DresserStephen Frye   InBox message sent to Dr. Lajoyce Cornersuda regarding abnormal u/a.

## 2018-08-10 NOTE — Telephone Encounter (Signed)
Patient called in about BP and says "I saw Dr. Clent RidgesFry on Monday and I told him that I stopped taking the Lisinopril-HCTZ because it was making me feel bad. My BP was good in the office. He said it was probably still in my system. I started having headaches after my visit, so I asked my husband to check my BP last night and it was 150/92. I was also having a little chest pain, just a little stab lasting a couple of minutes off and on." I asked is she having the chest pain and headache now, she says "no chest pain and I took fioricet for my headache." I asked about other symptoms, dizziness, blurred vision, weakness, she denies. Patient wants to know if she can be restarted back on Lisinopril-HCTZ in a lower dose or started on an new BP medication. I advised I will send to Dr. Clent RidgesFry and someone will call with his recommendation, she verbalized understanding.  Reason for Disposition . Ran out of BP medications  Answer Assessment - Initial Assessment Questions 1. BLOOD PRESSURE: "What is the blood pressure?" "Did you take at least two measurements 5 minutes apart?"     150/92 2. ONSET: "When did you take your blood pressure?"     Last night 3. HOW: "How did you obtain the blood pressure?" (e.g., visiting nurse, automatic home BP monitor)     Automatic cuff at home 4. HISTORY: "Do you have a history of high blood pressure?"     Yes 5. MEDICATIONS: "Are you taking any medications for blood pressure?" "Have you missed any doses recently?"     Yes, stopped HCTZ, but taking Verapamil 6. OTHER SYMPTOMS: "Do you have any symptoms?" (e.g., headache, chest pain, blurred vision, difficulty breathing, weakness)     Headache, off and on chest pain 7. PREGNANCY: "Is there any chance you are pregnant?" "When was your last menstrual period?"     No  Protocols used: HIGH BLOOD PRESSURE-A-AH

## 2018-08-11 NOTE — Telephone Encounter (Signed)
Stay off Lisinopril HCT. Call in Lisinopril 10 mg daily, #30 with 3 rf. Report back to use in 3 weeks

## 2018-08-13 MED ORDER — LISINOPRIL 10 MG PO TABS: 10.0000 mg | ORAL_TABLET | Freq: Every day | ORAL | 3 refills | Status: DC

## 2018-08-13 NOTE — Progress Notes (Addendum)
Anesthesia Chart Review:  Case:  [id number] Date/Time:  08/21/18 1210   Procedure:  TOTAL HIP ARTHROPLASTY ANTERIOR APPROACH (Right )   Anesthesia type:  Spinal   Pre-op diagnosis:  RIGHT HIP DEGENERATIVE JOINT DISEASE   [facility]:  [facility] / [facility]   Surgeon:  Marcene Corning, MD      DISCUSSION: 72 yo female former smoker. Pertinent hx includes CVA and multiple TIA, Palpitations, HTN. Surgery was rescheduled from last month due to diverticulitis flare.  Bicerberal CVA in 2006 and found to have PFO on TEE. Referred to Dr. Jacinto Halim but he was unable to close PFO due to small size. In 2009 was having episodes of dizziness and being pulled to her left side. There was question of vertebrobasilar TIAs. MRI mildly abnormal. Maintained on Plavix.  Dr. Ludwig Clarks note 11/23/2017 describes pt's cardiac history: "She has a long history of palpitations and presyncope. Patient apparently noted to have a PFO on transesophageal echocardiogram in 2006 following CVA. Event monitor in 2011 for palpitations showed sinus with PACs. Nuclear study September 2016 showed apical thinning and mild ischemia in the inferior lateral wall. Cardiac catheterization September 2016 showed no obstructive coronary disease and normal LV function. Carotid dopplers 6/18 showed 1-39 bilateral stenosis. Echo 6/18 showed normal LV function. Since last seen, patient has occasional chest pain.  It is substernal and described as a tightening.  Lasts 1-2 minutes and resolves.  There is associated dyspnea but no nausea or diaphoresis.  Not pleuritic, exertional.  She notes some dyspnea on exertion.  Occasional dizziness.  No syncope."  Cardiac clearance 07/10/2018 by Theodore Demark, PA-C states: " Abigail [name] was last seen on 11/23/2017 by Dr. Jens Som.  Since that day, Abigail [name] has done well.  Her verapamil dose had to be reduced for hypertension.  However, her palpitations are controlled and her blood pressure is better on the lower dose of  verapamil. Her activity level is limited by musculoskeletal issues, but not chest pain or shortness of breath. Previous catheterization showed no obstructive disease and her recent echocardiogram showed a normal EF with no wall motion abnormalities. Therefore, based on ACC/AHA guidelines, the patient would be at acceptable risk for the planned procedure without further cardiovascular testing."  Per Dr. Jens Som ok to hold Plavix 7d preop.  Anticipate she can proceed as planned barring acute status change.  VS: BP (!) 146/72   Pulse (!) 53   Temp 36.6 C   Resp 20   Ht 5' 5.5" (1.664 m)   Wt 81.8 kg   LMP  (LMP Unknown)   SpO2 98%   BMI 29.55 kg/m   PROVIDERS: Nelwyn Salisbury, MD is PCP  Delia Heady, MD is Neurologist  [name], MD is Cardiologist  LABS: Labs reviewed: Acceptable for surgery. U/A results inboxed to Dr. Jerl Santos by PAT nurse. (all labs ordered are listed, but only abnormal results are displayed)  Labs Reviewed  BASIC METABOLIC PANEL - Abnormal; Notable for the following components:      Result Value   Potassium 3.3 (*)    All other components within normal limits  URINALYSIS, ROUTINE W REFLEX MICROSCOPIC - Abnormal; Notable for the following components:   APPearance HAZY (*)    Hgb urine dipstick SMALL (*)    Leukocytes, UA LARGE (*)    Bacteria, UA FEW (*)    All other components within normal limits  APTT  CBC WITH DIFFERENTIAL/PLATELET  PROTIME-INR  TYPE AND SCREEN  IMAGES: CHEST - 2 VIEW 07/12/2018  COMPARISON:  02/24/2017  FINDINGS: Upper normal heart size.  Mediastinal contours and pulmonary vascularity normal.  Lungs clear.  No pulmonary infiltrate, pleural effusion or pneumothorax.  Scattered endplate spur formation thoracic spine.  IMPRESSION: No acute abnormalities.  EKG: 11/23/2017: NSR. Rate 65.  CV: TTE 12/07/2017: Study Conclusions  - Left ventricle: The cavity size was normal. There was mild focal    basal hypertrophy of the septum. Systolic function was vigorous.   The estimated ejection fraction was in the range of 65% to 70%.   Wall motion was normal; there were no regional wall motion   abnormalities. Doppler parameters are consistent with abnormal   left ventricular relaxation (grade 1 diastolic dysfunction). - Aortic valve: Trileaflet; mildly thickened, mildly calcified   leaflets.  Cath 06/01/2015:  Prox RCA lesion, 10% stenosed.  Prox Cx lesion, 10% stenosed.  Prox LAD to Dist LAD lesion, 10% stenosed.  The left ventricular systolic function is normal.   1. Mild non-obstructive CAD 2. Normal LV systolic function 3. Non-cardiac chest pain  Recommendations: Medical management of mild CAD.   Lexiscan 05/22/2015:  The left ventricular ejection fraction is mildly decreased (45-54%).  Nuclear stress EF: 54%.  There was no ST segment deviation noted during stress.  This is a low risk study.   Low risk stress nuclear study with a small, severe, fixed apical defect and a small, severe, reversible inferior lateral defect; findings consistent with apical thinning and mild ischemia in the inferior lateral wall; EF 54 and normal wall motion.  Past Medical History:  Diagnosis Date  . Bronchitis 09/2016  . Bruises easily   . DDD (degenerative disc disease)   . Degenerative joint disease of cervical spine 09-05-11   Cervival area, now some osteoarthritis-lower back and Rt. shoulder  . Depression   . Diverticulitis 09-05-11   hx. gastritis, diverticulitis x2 -now surgery planned  . DIVERTICULITIS, ACUTE 02/10/2010  . Dyspnea    with exertion  . Essential hypertension 09-05-11   tx. Verapamil  . GROIN PAIN 09/21/2010  . Headache(784.0)    headaches are better  . Hearing loss    right ear  . Heart palpitations   . Hemorrhoids   . History of bronchitis   . History of colonic polyps   . History of pneumonia   . History of tension headache   . Hyperlipidemia   .  Hypertension   . Hypothyroidism 09-05-11   Supplement used  . Late effect of adverse effect of drug, medicinal or biological substance   . NECK PAIN, ACUTE 09/21/2010  . Neuromuscular disorder (HCC)    carpal tunnel in both hands  . Nocturia   . Osteoarthritis 112-17-12   spine and rt. hip, rt. shoulder  . Patent foramen ovale    Small - unable to be closed -   . Pneumonia   . PONV (postoperative nausea and vomiting)   . Sleep apnea 09-05-11   no cpap ever, had surgery to remove cartilage, no problems now  . SORE THROAT 04/30/2009  . Stroke (HCC) 09-05-11   2006/2009-(loss of memory, balance issues remains occ.)  . TIA 09/28/2007, 10/2014  . Unstable angina (HCC)    a. 05/2010 Cath: nl cors, EF 55%;  b. 05/2015 Lexiscan MV: small, severe, fixed apical defect and a small, severe, reversible inf lateral defect w/ apical thinning and mild ischemia, EF 54%.  Marland Kitchen. URI 09/02/2008  . Vertigo     Past Surgical History:  Procedure Laterality Date  . ABDOMINAL HYSTERECTOMY    . BACK SURGERY    . CARDIAC CATHETERIZATION N/A 06/01/2015   Procedure: Left Heart Cath and Coronary Angiography;  Surgeon: Kathleene Hazel, MD;  Location: [facility] IN[facility] CV LAB;  Service: Cardiovascular;  Laterality: N/A;  . COLON RESECTION  09/07/2011   Procedure: LAPAROSCOPIC SIGMOID COLON RESECTION;  Surgeon: Mariella Saa, MD;  Location: [facility]S;  Service: General;  Laterality: N/A;  with proctoscopy  . COLONOSCOPY  08/06/2015   per Dr. Kinnie Scales, adenomatous polyps, repeat in 5 yrs   . CYST EXCISION N/A 11/29/2016   Procedure: EXCISION UMBILICAL CYST;  Surgeon: Glenna Fellows, MD;  Location: [facility];  Service: General;  Laterality: N/A;  . ELBOW SURGERY  09-05-11   left elbow -ligament repair  . EYE SURGERY     cataract  . INSERTION OF MESH N/A 12/13/2013   Procedure: INSERTION OF MESH;  Surgeon: Mariella Saa, MD;  Location: [facility];  Service: General;  Laterality: N/A;  . LAPAROSCOPY  09/07/2011    Procedure: LAPAROSCOPY DIAGNOSTIC;  Surgeon: Roselle Locus II;  Location: [facility];  Service: Gynecology;  Laterality: N/A;  . MAXIMUM ACCESS (MAS)POSTERIOR LUMBAR INTERBODY FUSION (PLIF) 2 LEVEL N/A 05/03/2016   Procedure: L4-5 L5-S1 Maximum access posterior lumbar interbody fusion;  Surgeon: Maeola Harman, MD;  Location: [facility];  Service: Neurosurgery;  Laterality: N/A;  L4-5 L5-S1 Maximum access posterior lumbar interbody fusion  . SALPINGOOPHORECTOMY  09/07/2011   Procedure: SALPINGO OOPHERECTOMY;  Surgeon: Roselle Locus II;  Location: [facility];  Service: Gynecology;  Laterality: Right;  . THYROIDECTOMY, PARTIAL  09-05-11  . TUBAL LIGATION  1983  . UMBILICAL HERNIA REPAIR  yrs ago  . VENTRAL HERNIA REPAIR  06/20/2012   Procedure: LAPAROSCOPIC VENTRAL HERNIA;  Surgeon: Mariella Saa, MD;  Location: [facility];  Service: General;  Laterality: N/A;  Laparoscopic Repair of Ventral Hernia with mesh  . VENTRAL HERNIA REPAIR N/A 12/13/2013   Procedure: LAPAROSCOPIC REPAIR RECURRENT VENTRAL INCISIONAL  HERNIA;  Surgeon: Mariella Saa, MD;  Location: [facility];  Service: General;  Laterality: N/A;    MEDICATIONS: . atorvastatin (LIPITOR) 40 MG tablet  . butalbital-acetaminophen-caffeine (FIORICET, ESGIC) 50-325-40 MG tablet  . clopidogrel (PLAVIX) 75 MG tablet  . levothyroxine (SYNTHROID, LEVOTHROID) 125 MCG tablet  . lisinopril-hydrochlorothiazide (PRINZIDE,ZESTORETIC) 20-12.5 MG tablet  . Menthol, Topical Analgesic, (ASPERCREME MAX ROLL-ON EX)  . tetrahydrozoline 0.05 % ophthalmic solution  . traMADol (ULTRAM) 50 MG tablet  . verapamil (CALAN-SR) 120 MG CR tablet   No current facility-administered medications for this encounter.

## 2018-08-13 NOTE — Anesthesia Preprocedure Evaluation (Addendum)
Anesthesia Evaluation  Patient identified by MRN, date of birth, ID band Patient awake    Reviewed: Allergy & Precautions, NPO status , Patient's Chart, lab work & pertinent test results  History of Anesthesia Complications (+) PONVNegative for: history of anesthetic complications  Airway Mallampati: III  TM Distance: >3 FB Neck ROM: Full    Dental no notable dental hx.    Pulmonary sleep apnea , former smoker,    Pulmonary exam normal        Cardiovascular hypertension, + CAD (mild nonobstructive)  Normal cardiovascular exam  PFO   Neuro/Psych TIACVA negative psych ROS   GI/Hepatic negative GI ROS, Neg liver ROS,   Endo/Other  Hypothyroidism   Renal/GU negative Renal ROS  negative genitourinary   Musculoskeletal  (+) Arthritis , Osteoarthritis,    Abdominal   Peds  Hematology negative hematology ROS (+)   Anesthesia Other Findings 72 yo F for R THA - CVA/TIAs (2006; on Plavix), HTN, CAD, PFO, hypothyroid, PONV, OSA  Dr. Ludwig Clarksrenshaw's note 11/23/2017 describes pt's cardiac history: "She has a long history of palpitations and presyncope. Patient apparently noted to have a PFO on transesophageal echocardiogram in 2006 following CVA. Event monitor in 2011 for palpitations showed sinus with PACs. Nuclear study September 2016 showed apical thinning and mild ischemia in the inferior lateral wall. Cardiac catheterization September 2016 showed no obstructive coronary disease and normal LV function.Carotid dopplers 6/18 showed 1-39 bilateral stenosis. Echo 6/18 showed normal LV function.Since last seen,patient has occasional chest pain. It is substernal and described as a tightening. Lasts 1-2 minutes and resolves. There is associated dyspnea but no nausea or diaphoresis. Not pleuritic, exertional. She notes some dyspnea on exertion. Occasional dizziness. No syncope."  Cardiac clearance 07/10/2018 by Theodore Demarkhonda Barrett, PA-C  states: "Abigail FreibergLynn G Claywas last seen on 3/7/2019by Dr. Jens Somrenshaw. Since that day, Abigail NanLynn G Clayhas done well.Her verapamil dose had to be reduced for hypertension. However, her palpitations are controlled and her blood pressure is better on the lower dose of verapamil. Her activity level is limited by musculoskeletal issues, but not chest pain or shortness of breath. Previous catheterization showed no obstructive disease and her recent echocardiogram showed a normal EF with no wall motion abnormalities. Therefore, based on ACC/AHA guidelines, the patient would be at acceptable risk for the planned procedure without further cardiovascular testing."  Reproductive/Obstetrics                           Anesthesia Physical Anesthesia Plan  ASA: III  Anesthesia Plan: Spinal   Post-op Pain Management:    Induction:   PONV Risk Score and Plan: 3 and Propofol infusion, TIVA and Treatment may vary due to age or medical condition  Airway Management Planned: Natural Airway, Nasal Cannula and Simple Face Mask  Additional Equipment: None  Intra-op Plan:   Post-operative Plan:   Informed Consent: I have reviewed the patients History and Physical, chart, labs and discussed the procedure including the risks, benefits and alternatives for the proposed anesthesia with the patient or authorized representative who has indicated his/her understanding and acceptance.     Plan Discussed with:   Anesthesia Plan Comments: (See PAT note 08/10/2018 by Antionette PolesJames Burns, PA-C  Last dose Plavix 11/26- OK for spinal)      Anesthesia Quick Evaluation

## 2018-08-13 NOTE — Telephone Encounter (Signed)
Called and spoke with pt and she is aware of D.r Fry;s recs to change the BP med to lisinopril 10 mg daily.   This has been sent to her pharmacy and pt is aware to update us in 3 weeks.

## 2018-08-14 ENCOUNTER — Telehealth: Payer: Self-pay | Admitting: *Deleted

## 2018-08-14 NOTE — Telephone Encounter (Signed)
Copied from CRM [phone]. Topic: General - Inquiry >> Aug 13, 2018  8:24 AM Windy KalataMichael, Taylor L, NT wrote: Reason for CRM: patient is calling back to follow up on her triage call from Friday 08/10/18. Please contact.

## 2018-08-14 NOTE — Telephone Encounter (Signed)
Spoke with pt on 11/25.  She is to follow up with us in 3 weeks after being on the lisinopril. Pt stated that she did not need anything further .

## 2018-08-20 MED ORDER — VANCOMYCIN HCL IN DEXTROSE 1-5 GM/200ML-% IV SOLN
1000.0000 mg | INTRAVENOUS | Status: AC
  Administered 2018-08-21: 1000 mg via INTRAVENOUS
  Filled 2018-08-20 (×2): qty 200

## 2018-08-20 MED ORDER — BUPIVACAINE LIPOSOME 1.3 % IJ SUSP
10.0000 mL | INTRAMUSCULAR | Status: AC
  Administered 2018-08-21: 20 mL
  Filled 2018-08-20: qty 10

## 2018-08-20 MED ORDER — TRANEXAMIC ACID-NACL 1000-0.7 MG/100ML-% IV SOLN
1000.0000 mg | INTRAVENOUS | Status: AC
  Administered 2018-08-21: 1000 mg via INTRAVENOUS
  Filled 2018-08-20: qty 100

## 2018-08-20 MED ORDER — TRANEXAMIC ACID 1000 MG/10ML IV SOLN
2000.0000 mg | INTRAVENOUS | Status: AC
  Administered 2018-08-21: 2000 mg via TOPICAL
  Filled 2018-08-20: qty 20

## 2018-08-20 NOTE — H&P (Signed)
TOTAL HIP ADMISSION H&P  Patient is admitted for right total hip arthroplasty.  Subjective:  Chief Complaint: right hip pain  HPI: Abigail [name], 72 y.o. female, has a history of pain and functional disability in the right hip(s) due to arthritis and patient has failed non-surgical conservative treatments for greater than 12 weeks to include NSAID's and/or analgesics, corticosteriod injections, flexibility and strengthening excercises, use of assistive devices, weight reduction as appropriate and activity modification.  Onset of symptoms was gradual starting 5 years ago with gradually worsening course since that time.The patient noted no past surgery on the right hip(s).  Patient currently rates pain in the right hip at 10 out of 10 with activity. Patient has night pain, worsening of pain with activity and weight bearing, trendelenberg gait, pain that interfers with activities of daily living and crepitus. Patient has evidence of subchondral cysts, subchondral sclerosis, periarticular osteophytes and joint space narrowing by imaging studies. This condition presents safety issues increasing the risk of falls. There is no current active infection.  Patient Active Problem List   Diagnosis Date Noted  . HTN (hypertension) 05/15/2018  . PFO (patent foramen ovale)   . Acute CVA (cerebrovascular accident) (HCC) 02/23/2017  . Right facial numbness 02/23/2017  . Chronic back pain 02/23/2017  . History of partial thyroidectomy 02/23/2017  . Spondylolisthesis of lumbar region 05/03/2016  . Dizziness and giddiness 06/04/2015  . Right-sided lacunar infarction (HCC) 06/04/2015  . Lacunar ataxic hemiparesis (HCC) 06/03/2015  . Unstable angina (HCC)   . Chest pain 05/07/2015  . Tick bite of right forearm 09/04/2014  . Ventral incisional hernia 12/13/2013  . Benign microscopic hematuria 03/05/2012  . Diverticulitis of sigmoid colon 05/04/2011  . PRECORDIAL PAIN 06/08/2010  . PALPITATIONS 05/27/2010  .  CERVICAL LYMPHADENOPATHY, RIGHT 05/15/2008  . Headache 02/28/2008  . TENSION HEADACHE 02/12/2008  . Sebaceous cyst 12/11/2007  . PROBLEMS WITH SWALLOWING AND MASTICATION 11/20/2007  . COLONIC POLYPS, HX OF 07/23/2007  . Hypothyroidism 03/06/2007  . Hyperlipidemia 03/06/2007  . Essential hypertension 03/06/2007  . History of cardiovascular disorder 03/06/2007   Past Medical History:  Diagnosis Date  . Bronchitis 09/2016  . Bruises easily   . DDD (degenerative disc disease)   . Degenerative joint disease of cervical spine 09-05-11   Cervival area, now some osteoarthritis-lower back and Rt. shoulder  . Depression   . Diverticulitis 09-05-11   hx. gastritis, diverticulitis x2 -now surgery planned  . DIVERTICULITIS, ACUTE 02/10/2010  . Dyspnea    with exertion  . Essential hypertension 09-05-11   tx. Verapamil  . GROIN PAIN 09/21/2010  . Headache(784.0)    headaches are better  . Hearing loss    right ear  . Heart palpitations   . Hemorrhoids   . History of bronchitis   . History of colonic polyps   . History of pneumonia   . History of tension headache   . Hyperlipidemia   . Hypertension   . Hypothyroidism 09-05-11   Supplement used  . Late effect of adverse effect of drug, medicinal or biological substance   . NECK PAIN, ACUTE 09/21/2010  . Neuromuscular disorder (HCC)    carpal tunnel in both hands  . Nocturia   . Osteoarthritis 112-17-12   spine and rt. hip, rt. shoulder  . Patent foramen ovale    Small - unable to be closed -   . Pneumonia   . PONV (postoperative nausea and vomiting)   . Sleep apnea 09-05-11   no cpap ever,  had surgery to remove cartilage, no problems now  . SORE THROAT 04/30/2009  . Stroke (HCC) 09-05-11   2006/2009-(loss of memory, balance issues remains occ.)  . TIA 09/28/2007, 10/2014  . Unstable angina (HCC)    a. 05/2010 Cath: nl cors, EF 55%;  b. 05/2015 Lexiscan MV: small, severe, fixed apical defect and a small, severe, reversible inf  lateral defect w/ apical thinning and mild ischemia, EF 54%.  Marland Kitchen URI 09/02/2008  . Vertigo     Past Surgical History:  Procedure Laterality Date  . ABDOMINAL HYSTERECTOMY    . BACK SURGERY    . CARDIAC CATHETERIZATION N/A 06/01/2015   Procedure: Left Heart Cath and Coronary Angiography;  Surgeon: Kathleene Hazel, MD;  Location: [facility];  Service: Cardiovascular;  Laterality: N/A;  . COLON RESECTION  09/07/2011   Procedure: LAPAROSCOPIC SIGMOID COLON RESECTION;  Surgeon: Mariella Saa, MD;  Location: [facility];  Service: General;  Laterality: N/A;  with proctoscopy  . COLONOSCOPY  08/06/2015   per Dr. Kinnie Scales, adenomatous polyps, repeat in 5 yrs   . CYST EXCISION N/A 11/29/2016   Procedure: EXCISION UMBILICAL CYST;  Surgeon: Glenna Fellows, MD;  Location: [facility];  Service: General;  Laterality: N/A;  . ELBOW SURGERY  09-05-11   left elbow -ligament repair  . EYE SURGERY     cataract  . INSERTION OF MESH N/A 12/13/2013   Procedure: INSERTION OF MESH;  Surgeon: Mariella Saa, MD;  Location: [facility];  Service: General;  Laterality: N/A;  . LAPAROSCOPY  09/07/2011   Procedure: LAPAROSCOPY DIAGNOSTIC;  Surgeon: Roselle Locus II;  Location: [facility];  Service: Gynecology;  Laterality: N/A;  . MAXIMUM ACCESS (MAS)POSTERIOR LUMBAR INTERBODY FUSION (PLIF) 2 LEVEL N/A 05/03/2016   Procedure: L4-5 L5-S1 Maximum access posterior lumbar interbody fusion;  Surgeon: Maeola Harman, MD;  Location: [facility];  Service: Neurosurgery;  Laterality: N/A;  L4-5 L5-S1 Maximum access posterior lumbar interbody fusion  . SALPINGOOPHORECTOMY  09/07/2011   Procedure: SALPINGO OOPHERECTOMY;  Surgeon: Roselle Locus II;  Location: [facility];  Service: Gynecology;  Laterality: Right;  . THYROIDECTOMY, PARTIAL  09-05-11  . TUBAL LIGATION  1983  . UMBILICAL HERNIA REPAIR  yrs ago  . VENTRAL HERNIA REPAIR  06/20/2012   Procedure: LAPAROSCOPIC VENTRAL HERNIA;  Surgeon: Mariella Saa, MD;   Location: [facility];  Service: General;  Laterality: N/A;  Laparoscopic Repair of Ventral Hernia with mesh  . VENTRAL HERNIA REPAIR N/A 12/13/2013   Procedure: LAPAROSCOPIC REPAIR RECURRENT VENTRAL INCISIONAL  HERNIA;  Surgeon: Mariella Saa, MD;  Location: [facility];  Service: General;  Laterality: N/A;    No current facility-administered medications for this encounter.    Current Outpatient Medications  Medication Sig Dispense Refill Last Dose  . atorvastatin (LIPITOR) 40 MG tablet Take 1 tablet (40 mg total) by mouth daily. (Patient taking differently: Take 40 mg by mouth every evening. ) 90 tablet 0 Taking  . butalbital-acetaminophen-caffeine (FIORICET, ESGIC) 50-325-40 MG tablet TAKE 1 TABLET BY MOUTH EVERY 6 HOURS AS NEEDED FOR HEADACHE (Patient taking differently: Take 1 tablet by mouth every 6 (six) hours as needed for headache. TAKE 1 TABLET BY MOUTH EVERY 6 HOURS AS NEEDED FOR HEADACHE) 60 tablet 5 Taking  . clopidogrel (PLAVIX) 75 MG tablet TAKE 1 TABLET BY MOUTH ONCE A DAY (Patient taking differently: Take 75 mg by mouth every evening. ) 90 tablet 1 Taking  . levothyroxine (SYNTHROID, LEVOTHROID) 125 MCG tablet TAKE  1 TABLET BY MOUTH EVERY DAY BEFORE BREAKFAST (Patient taking differently: Take 125 mcg by mouth daily before breakfast. TAKE 1 TABLET BY MOUTH EVERY DAY BEFORE BREAKFAST) 90 tablet 0 Taking  . Menthol, Topical Analgesic, (ASPERCREME MAX ROLL-ON EX) Apply 1 application topically daily as needed (back pain).   Taking  . tetrahydrozoline 0.05 % ophthalmic solution Place 1 drop into both eyes 4 (four) times daily as needed (for dry/irritated eyes.).     Marland Kitchen. traMADol (ULTRAM) 50 MG tablet TAKE 1 TO 2 TABLETS BY MOUTH EVERY 4 TO 6 HOURS AS NEEDED FOR PAIN (Patient taking differently: Take 50 mg by mouth every 6 (six) hours as needed for severe pain. ) 40 tablet 0 Taking  . verapamil (CALAN-SR) 120 MG CR tablet Take 1 tablet (120 mg total) by mouth at bedtime. 30 tablet 2 Taking  .  lisinopril (PRINIVIL,ZESTRIL) 10 MG tablet Take 1 tablet (10 mg total) by mouth daily. 30 tablet 3    Allergies  Allergen Reactions  . Dilaudid [Hydromorphone Hcl] Shortness Of Breath, Nausea And Vomiting and Other (See Comments)    Able to take morphine without issue  . Shellfish Allergy Shortness Of Breath and Rash    Only shrimp allergy  . Aggrenox [Aspirin-Dipyridamole Er] Nausea And Vomiting and Other (See Comments)    Headache   . Cephalexin Itching and Rash  . Codeine Phosphate Nausea And Vomiting  . Contrast Media [Iodinated Diagnostic Agents] Nausea And Vomiting  . Hydrocodone Nausea And Vomiting and Other (See Comments)    Can take morphine without issue  . Meperidine Hcl Nausea And Vomiting  . Percocet [Oxycodone-Acetaminophen] Nausea And Vomiting and Other (See Comments)    Can take morphine without issue    Social History   Tobacco Use  . Smoking status: Former Smoker    Packs/day: 0.50    Years: 5.00    Pack years: 2.50    Types: Cigarettes    Last attempt to quit: 09/19/1968    Years since quitting: 49.9  . Smokeless tobacco: Never Used  Substance Use Topics  . Alcohol use: Yes    Alcohol/week: 2.0 standard drinks    Types: 1 Cans of beer, 1 Shots of liquor per week    Comment: rarely    Family History  Problem Relation Age of Onset  . Arrhythmia Mother   . Hypertension Mother   . Stroke Mother   . Stroke Maternal Grandmother   . Diabetes Paternal Grandmother      Review of Systems  Musculoskeletal: Positive for joint pain.       Right hip  All other systems reviewed and are negative.   Objective:  Physical Exam  Constitutional: She is oriented to person, place, and time. She appears well-developed and well-nourished.  HENT:  Head: Normocephalic and atraumatic.  Eyes: Pupils are equal, round, and reactive to light.  Neck: Normal range of motion.  Cardiovascular: Normal rate and regular rhythm.  Respiratory: Effort normal.  GI: Soft.   Musculoskeletal:  Right hip motion is limited and extremely painful in internal rotation.  Her leg lengths are equal.  She has a healed scar on her back.  Abdominal exam is benign.  Sensation and motor function are intact distally with palpable pulses in her feet.  Neurological: She is alert and oriented to person, place, and time.  Skin: Skin is warm and dry.  Psychiatric: She has a normal mood and affect. Her behavior is normal. Judgment and thought content normal.  Vital signs in last 24 hours:    Labs:   Estimated body mass index is 29.55 kg/m as calculated from the following:   Height as of 08/10/18: 5' 5.5" (1.664 m).   Weight as of 08/10/18: 81.8 kg.   Imaging Review Plain radiographs demonstrate severe degenerative joint disease of the right hip(s). The bone quality appears to be good for age and reported activity level.    Preoperative templating of the joint replacement has been completed, documented, and submitted to the Operating Room personnel in order to optimize intra-operative equipment management.     Assessment/Plan:  End stage primary arthritis, right hip(s)  The patient history, physical examination, clinical judgement of the provider and imaging studies are consistent with end stage degenerative joint disease of the right hip(s) and total hip arthroplasty is deemed medically necessary. The treatment options including medical management, injection therapy, arthroscopy and arthroplasty were discussed at length. The risks and benefits of total hip arthroplasty were presented and reviewed. The risks due to aseptic loosening, infection, stiffness, dislocation/subluxation,  thromboembolic complications and other imponderables were discussed.  The patient acknowledged the explanation, agreed to proceed with the plan and consent was signed. Patient is being admitted for inpatient treatment for surgery, pain control, PT, OT, prophylactic antibiotics, VTE prophylaxis,  progressive ambulation and ADL's and discharge planning.The patient is planning to be discharged home with home health services

## 2018-08-21 ENCOUNTER — Encounter (HOSPITAL_COMMUNITY): Payer: Self-pay

## 2018-08-21 ENCOUNTER — Other Ambulatory Visit: Payer: Self-pay

## 2018-08-21 ENCOUNTER — Inpatient Hospital Stay (HOSPITAL_COMMUNITY): Payer: Medicare Other | Admitting: Anesthesiology

## 2018-08-21 ENCOUNTER — Inpatient Hospital Stay (HOSPITAL_COMMUNITY): Payer: Medicare Other

## 2018-08-21 ENCOUNTER — Inpatient Hospital Stay (HOSPITAL_COMMUNITY): Payer: Medicare Other | Admitting: Physician Assistant

## 2018-08-21 ENCOUNTER — Encounter (HOSPITAL_COMMUNITY)
Admission: RE | Disposition: A | Discharge status: Home Health | Payer: Self-pay | Source: Home / Self Care | Attending: Orthopaedic Surgery

## 2018-08-21 ENCOUNTER — Inpatient Hospital Stay (HOSPITAL_COMMUNITY)
Admission: RE | Admit: 2018-08-21 | Discharge: 2018-08-25 | DRG: 470 | Disposition: A | Discharge status: Home Health | Payer: Medicare Other | Attending: Orthopaedic Surgery | Admitting: Orthopaedic Surgery

## 2018-08-21 DIAGNOSIS — R002 Palpitations: Secondary | ICD-10-CM | POA: Diagnosis present

## 2018-08-21 DIAGNOSIS — Z8701 Personal history of pneumonia (recurrent): Secondary | ICD-10-CM

## 2018-08-21 DIAGNOSIS — I10 Essential (primary) hypertension: Secondary | ICD-10-CM | POA: Diagnosis present

## 2018-08-21 DIAGNOSIS — M1611 Unilateral primary osteoarthritis, right hip: Secondary | ICD-10-CM | POA: Diagnosis present

## 2018-08-21 DIAGNOSIS — Z91013 Allergy to seafood: Secondary | ICD-10-CM

## 2018-08-21 DIAGNOSIS — Z8719 Personal history of other diseases of the digestive system: Secondary | ICD-10-CM | POA: Diagnosis not present

## 2018-08-21 DIAGNOSIS — Z833 Family history of diabetes mellitus: Secondary | ICD-10-CM

## 2018-08-21 DIAGNOSIS — Z881 Allergy status to other antibiotic agents status: Secondary | ICD-10-CM | POA: Diagnosis not present

## 2018-08-21 DIAGNOSIS — Z9049 Acquired absence of other specified parts of digestive tract: Secondary | ICD-10-CM | POA: Diagnosis not present

## 2018-08-21 DIAGNOSIS — Z823 Family history of stroke: Secondary | ICD-10-CM | POA: Diagnosis not present

## 2018-08-21 DIAGNOSIS — Z91041 Radiographic dye allergy status: Secondary | ICD-10-CM | POA: Diagnosis not present

## 2018-08-21 DIAGNOSIS — Z87891 Personal history of nicotine dependence: Secondary | ICD-10-CM

## 2018-08-21 DIAGNOSIS — Z8249 Family history of ischemic heart disease and other diseases of the circulatory system: Secondary | ICD-10-CM

## 2018-08-21 DIAGNOSIS — Z8673 Personal history of transient ischemic attack (TIA), and cerebral infarction without residual deficits: Secondary | ICD-10-CM

## 2018-08-21 DIAGNOSIS — Z981 Arthrodesis status: Secondary | ICD-10-CM | POA: Diagnosis not present

## 2018-08-21 DIAGNOSIS — Z419 Encounter for procedure for purposes other than remedying health state, unspecified: Secondary | ICD-10-CM

## 2018-08-21 DIAGNOSIS — Z885 Allergy status to narcotic agent status: Secondary | ICD-10-CM

## 2018-08-21 DIAGNOSIS — G4733 Obstructive sleep apnea (adult) (pediatric): Secondary | ICD-10-CM | POA: Diagnosis present

## 2018-08-21 DIAGNOSIS — Z79899 Other long term (current) drug therapy: Secondary | ICD-10-CM | POA: Diagnosis not present

## 2018-08-21 DIAGNOSIS — Z79890 Hormone replacement therapy (postmenopausal): Secondary | ICD-10-CM | POA: Diagnosis not present

## 2018-08-21 DIAGNOSIS — E039 Hypothyroidism, unspecified: Secondary | ICD-10-CM | POA: Diagnosis present

## 2018-08-21 DIAGNOSIS — Z96649 Presence of unspecified artificial hip joint: Secondary | ICD-10-CM

## 2018-08-21 DIAGNOSIS — E785 Hyperlipidemia, unspecified: Secondary | ICD-10-CM | POA: Diagnosis present

## 2018-08-21 DIAGNOSIS — Z7902 Long term (current) use of antithrombotics/antiplatelets: Secondary | ICD-10-CM

## 2018-08-21 DIAGNOSIS — M25551 Pain in right hip: Secondary | ICD-10-CM | POA: Diagnosis present

## 2018-08-21 HISTORY — PX: TOTAL HIP ARTHROPLASTY: SHX124

## 2018-08-21 SURGERY — ARTHROPLASTY, HIP, TOTAL, ANTERIOR APPROACH
Anesthesia: Spinal | Site: Hip | Laterality: Right

## 2018-08-21 MED ORDER — ROCURONIUM BROMIDE 10 MG/ML (PF) SYRINGE
PREFILLED_SYRINGE | INTRAVENOUS | Status: DC | PRN
  Administered 2018-08-21 (×2): 10 mg via INTRAVENOUS
  Administered 2018-08-21: 50 mg via INTRAVENOUS
  Administered 2018-08-21: 20 mg via INTRAVENOUS
  Administered 2018-08-21: 10 mg via INTRAVENOUS

## 2018-08-21 MED ORDER — DIPHENHYDRAMINE HCL 50 MG/ML IJ SOLN
INTRAMUSCULAR | Status: AC
  Filled 2018-08-21: qty 1

## 2018-08-21 MED ORDER — ATORVASTATIN CALCIUM 40 MG PO TABS
40.0000 mg | ORAL_TABLET | Freq: Every evening | ORAL | Status: DC
  Administered 2018-08-21 – 2018-08-24 (×4): 40 mg via ORAL
  Filled 2018-08-21 (×4): qty 1

## 2018-08-21 MED ORDER — MIDAZOLAM HCL 5 MG/5ML IJ SOLN
INTRAMUSCULAR | Status: DC | PRN
  Administered 2018-08-21: 1 mg via INTRAVENOUS

## 2018-08-21 MED ORDER — DEXAMETHASONE SODIUM PHOSPHATE 10 MG/ML IJ SOLN
INTRAMUSCULAR | Status: AC
  Filled 2018-08-21: qty 1

## 2018-08-21 MED ORDER — KETOROLAC TROMETHAMINE 15 MG/ML IJ SOLN
7.5000 mg | Freq: Four times a day (QID) | INTRAMUSCULAR | Status: AC
  Administered 2018-08-21 – 2018-08-22 (×4): 7.5 mg via INTRAVENOUS
  Filled 2018-08-21 (×4): qty 1

## 2018-08-21 MED ORDER — METHOCARBAMOL 500 MG PO TABS
ORAL_TABLET | ORAL | Status: AC
  Filled 2018-08-21: qty 1

## 2018-08-21 MED ORDER — BUPIVACAINE-EPINEPHRINE (PF) 0.25% -1:200000 IJ SOLN
INTRAMUSCULAR | Status: AC
  Filled 2018-08-21: qty 30

## 2018-08-21 MED ORDER — DIPHENHYDRAMINE HCL 50 MG/ML IJ SOLN
INTRAMUSCULAR | Status: DC | PRN
  Administered 2018-08-21: 10 mg via INTRAVENOUS

## 2018-08-21 MED ORDER — METOCLOPRAMIDE HCL 5 MG/ML IJ SOLN: 5.0000 mg | Freq: Three times a day (TID) | INTRAMUSCULAR | Status: DC | PRN

## 2018-08-21 MED ORDER — SUGAMMADEX SODIUM 200 MG/2ML IV SOLN
INTRAVENOUS | Status: DC | PRN
  Administered 2018-08-21: 200 mg via INTRAVENOUS

## 2018-08-21 MED ORDER — MENTHOL 3 MG MT LOZG: 1.0000 | LOZENGE | OROMUCOSAL | Status: DC | PRN

## 2018-08-21 MED ORDER — LIDOCAINE 2% (20 MG/ML) 5 ML SYRINGE
INTRAMUSCULAR | Status: DC | PRN
  Administered 2018-08-21: 100 mg via INTRAVENOUS

## 2018-08-21 MED ORDER — PHENOL 1.4 % MT LIQD: 1.0000 | OROMUCOSAL | Status: DC | PRN

## 2018-08-21 MED ORDER — PROPOFOL 10 MG/ML IV BOLUS
INTRAVENOUS | Status: AC
  Filled 2018-08-21: qty 20

## 2018-08-21 MED ORDER — DIPHENHYDRAMINE HCL 12.5 MG/5ML PO ELIX: 12.5000 mg | ORAL_SOLUTION | ORAL | Status: DC | PRN

## 2018-08-21 MED ORDER — METOCLOPRAMIDE HCL 5 MG PO TABS: 5.0000 mg | ORAL_TABLET | Freq: Three times a day (TID) | ORAL | Status: DC | PRN

## 2018-08-21 MED ORDER — LISINOPRIL 10 MG PO TABS
10.0000 mg | ORAL_TABLET | Freq: Every day | ORAL | Status: DC
  Administered 2018-08-21 – 2018-08-25 (×4): 10 mg via ORAL
  Filled 2018-08-21 (×5): qty 1

## 2018-08-21 MED ORDER — METHOCARBAMOL 1000 MG/10ML IJ SOLN
500.0000 mg | Freq: Four times a day (QID) | INTRAVENOUS | Status: DC | PRN
  Filled 2018-08-21: qty 5

## 2018-08-21 MED ORDER — PROPOFOL 10 MG/ML IV BOLUS
INTRAVENOUS | Status: DC | PRN
  Administered 2018-08-21: 130 mg via INTRAVENOUS

## 2018-08-21 MED ORDER — TRANEXAMIC ACID-NACL 1000-0.7 MG/100ML-% IV SOLN
1000.0000 mg | Freq: Once | INTRAVENOUS | Status: AC
  Administered 2018-08-21: 1000 mg via INTRAVENOUS
  Filled 2018-08-21: qty 100

## 2018-08-21 MED ORDER — ONDANSETRON HCL 4 MG PO TABS: 4.0000 mg | ORAL_TABLET | Freq: Four times a day (QID) | ORAL | Status: DC | PRN

## 2018-08-21 MED ORDER — ACETAMINOPHEN 325 MG PO TABS
325.0000 mg | ORAL_TABLET | Freq: Four times a day (QID) | ORAL | Status: DC | PRN
  Administered 2018-08-22 – 2018-08-25 (×3): 650 mg via ORAL
  Filled 2018-08-21 (×3): qty 2

## 2018-08-21 MED ORDER — ROCURONIUM BROMIDE 50 MG/5ML IV SOSY
PREFILLED_SYRINGE | INTRAVENOUS | Status: AC
  Filled 2018-08-21: qty 10

## 2018-08-21 MED ORDER — MORPHINE SULFATE (PF) 2 MG/ML IV SOLN
0.5000 mg | INTRAVENOUS | Status: DC | PRN
  Administered 2018-08-21 – 2018-08-24 (×10): 1 mg via INTRAVENOUS
  Filled 2018-08-21 (×11): qty 1

## 2018-08-21 MED ORDER — LACTATED RINGERS IV SOLN
INTRAVENOUS | Status: DC
  Administered 2018-08-21: 17:00:00 via INTRAVENOUS

## 2018-08-21 MED ORDER — VANCOMYCIN HCL IN DEXTROSE 1-5 GM/200ML-% IV SOLN
1000.0000 mg | Freq: Two times a day (BID) | INTRAVENOUS | Status: AC
  Administered 2018-08-22: 1000 mg via INTRAVENOUS
  Filled 2018-08-21: qty 200

## 2018-08-21 MED ORDER — LACTATED RINGERS IV SOLN
INTRAVENOUS | Status: DC
  Administered 2018-08-21 (×2): via INTRAVENOUS

## 2018-08-21 MED ORDER — DEXAMETHASONE SODIUM PHOSPHATE 10 MG/ML IJ SOLN
INTRAMUSCULAR | Status: DC | PRN
  Administered 2018-08-21: 10 mg via INTRAVENOUS

## 2018-08-21 MED ORDER — FENTANYL CITRATE (PF) 250 MCG/5ML IJ SOLN
INTRAMUSCULAR | Status: DC | PRN
  Administered 2018-08-21: 50 ug via INTRAVENOUS
  Administered 2018-08-21: 25 ug via INTRAVENOUS
  Administered 2018-08-21: 75 ug via INTRAVENOUS
  Administered 2018-08-21 (×2): 50 ug via INTRAVENOUS

## 2018-08-21 MED ORDER — LEVOTHYROXINE SODIUM 25 MCG PO TABS
125.0000 ug | ORAL_TABLET | Freq: Every day | ORAL | Status: DC
  Administered 2018-08-22 – 2018-08-25 (×4): 125 ug via ORAL
  Filled 2018-08-21 (×4): qty 1

## 2018-08-21 MED ORDER — SODIUM CHLORIDE 0.9 % IV SOLN
INTRAVENOUS | Status: DC | PRN
  Administered 2018-08-21: 50 ug/min via INTRAVENOUS

## 2018-08-21 MED ORDER — ONDANSETRON HCL 4 MG/2ML IJ SOLN
INTRAMUSCULAR | Status: DC | PRN
  Administered 2018-08-21: 4 mg via INTRAVENOUS

## 2018-08-21 MED ORDER — MIDAZOLAM HCL 2 MG/2ML IJ SOLN
INTRAMUSCULAR | Status: AC
  Filled 2018-08-21: qty 2

## 2018-08-21 MED ORDER — FENTANYL CITRATE (PF) 100 MCG/2ML IJ SOLN
INTRAMUSCULAR | Status: AC
  Filled 2018-08-21: qty 2

## 2018-08-21 MED ORDER — PROPOFOL 1000 MG/100ML IV EMUL
INTRAVENOUS | Status: AC
  Filled 2018-08-21: qty 100

## 2018-08-21 MED ORDER — 0.9 % SODIUM CHLORIDE (POUR BTL) OPTIME
TOPICAL | Status: DC | PRN
  Administered 2018-08-21: 1000 mL

## 2018-08-21 MED ORDER — FENTANYL CITRATE (PF) 100 MCG/2ML IJ SOLN
25.0000 ug | INTRAMUSCULAR | Status: DC | PRN
  Administered 2018-08-21 (×3): 50 ug via INTRAVENOUS

## 2018-08-21 MED ORDER — ONDANSETRON HCL 4 MG/2ML IJ SOLN
INTRAMUSCULAR | Status: AC
  Filled 2018-08-21: qty 2

## 2018-08-21 MED ORDER — LIDOCAINE 2% (20 MG/ML) 5 ML SYRINGE
INTRAMUSCULAR | Status: AC
  Filled 2018-08-21: qty 5

## 2018-08-21 MED ORDER — CLOPIDOGREL BISULFATE 75 MG PO TABS
75.0000 mg | ORAL_TABLET | Freq: Every evening | ORAL | Status: DC
  Administered 2018-08-22 – 2018-08-24 (×3): 75 mg via ORAL
  Filled 2018-08-21 (×3): qty 1

## 2018-08-21 MED ORDER — ALUM & MAG HYDROXIDE-SIMETH 200-200-20 MG/5ML PO SUSP: 30.0000 mL | ORAL | Status: DC | PRN

## 2018-08-21 MED ORDER — ACETAMINOPHEN 500 MG PO TABS
500.0000 mg | ORAL_TABLET | Freq: Four times a day (QID) | ORAL | Status: AC
  Administered 2018-08-21 – 2018-08-22 (×4): 500 mg via ORAL
  Filled 2018-08-21 (×5): qty 1

## 2018-08-21 MED ORDER — BUTALBITAL-APAP-CAFFEINE 50-325-40 MG PO TABS
1.0000 | ORAL_TABLET | Freq: Four times a day (QID) | ORAL | Status: DC | PRN
  Administered 2018-08-22 (×2): 1 via ORAL
  Filled 2018-08-21 (×2): qty 1

## 2018-08-21 MED ORDER — TRAMADOL HCL 50 MG PO TABS
50.0000 mg | ORAL_TABLET | Freq: Four times a day (QID) | ORAL | Status: DC | PRN
  Administered 2018-08-21 – 2018-08-25 (×10): 100 mg via ORAL
  Filled 2018-08-21 (×9): qty 2

## 2018-08-21 MED ORDER — FENTANYL CITRATE (PF) 250 MCG/5ML IJ SOLN
INTRAMUSCULAR | Status: AC
  Filled 2018-08-21: qty 5

## 2018-08-21 MED ORDER — BUPIVACAINE-EPINEPHRINE (PF) 0.25% -1:200000 IJ SOLN
INTRAMUSCULAR | Status: DC | PRN
  Administered 2018-08-21: 30 mL

## 2018-08-21 MED ORDER — TRAMADOL HCL 50 MG PO TABS
ORAL_TABLET | ORAL | Status: AC
  Filled 2018-08-21: qty 2

## 2018-08-21 MED ORDER — OXYCODONE HCL 5 MG/5ML PO SOLN: 5.0000 mg | Freq: Once | ORAL | Status: DC | PRN

## 2018-08-21 MED ORDER — METHOCARBAMOL 500 MG PO TABS
500.0000 mg | ORAL_TABLET | Freq: Four times a day (QID) | ORAL | Status: DC | PRN
  Administered 2018-08-21 – 2018-08-25 (×11): 500 mg via ORAL
  Filled 2018-08-21 (×10): qty 1

## 2018-08-21 MED ORDER — VERAPAMIL HCL ER 120 MG PO TBCR
120.0000 mg | EXTENDED_RELEASE_TABLET | Freq: Every day | ORAL | Status: DC
  Administered 2018-08-21 – 2018-08-24 (×4): 120 mg via ORAL
  Filled 2018-08-21 (×4): qty 1

## 2018-08-21 MED ORDER — ONDANSETRON HCL 4 MG/2ML IJ SOLN
4.0000 mg | Freq: Four times a day (QID) | INTRAMUSCULAR | Status: DC | PRN
  Administered 2018-08-23: 4 mg via INTRAVENOUS
  Filled 2018-08-21: qty 2

## 2018-08-21 MED ORDER — BISACODYL 5 MG PO TBEC
5.0000 mg | DELAYED_RELEASE_TABLET | Freq: Every day | ORAL | Status: DC | PRN
  Administered 2018-08-22: 5 mg via ORAL
  Filled 2018-08-21: qty 1

## 2018-08-21 MED ORDER — ONDANSETRON HCL 4 MG/2ML IJ SOLN: 4.0000 mg | Freq: Once | INTRAMUSCULAR | Status: DC | PRN

## 2018-08-21 MED ORDER — ASPIRIN EC 325 MG PO TBEC
325.0000 mg | DELAYED_RELEASE_TABLET | Freq: Two times a day (BID) | ORAL | Status: DC
  Administered 2018-08-22 – 2018-08-25 (×7): 325 mg via ORAL
  Filled 2018-08-21 (×8): qty 1

## 2018-08-21 MED ORDER — PROPOFOL 500 MG/50ML IV EMUL
INTRAVENOUS | Status: DC | PRN
  Administered 2018-08-21: 20 ug/kg/min via INTRAVENOUS

## 2018-08-21 MED ORDER — DOCUSATE SODIUM 100 MG PO CAPS
100.0000 mg | ORAL_CAPSULE | Freq: Two times a day (BID) | ORAL | Status: DC
  Administered 2018-08-21 – 2018-08-25 (×5): 100 mg via ORAL
  Filled 2018-08-21 (×7): qty 1

## 2018-08-21 MED ORDER — OXYCODONE HCL 5 MG PO TABS: 5.0000 mg | ORAL_TABLET | Freq: Once | ORAL | Status: DC | PRN

## 2018-08-21 SURGICAL SUPPLY — 47 items
BLADE SAW SGTL 18X1.27X75 (BLADE) ×2 IMPLANT
CELLS DAT CNTRL 66122 CELL SVR (MISCELLANEOUS) ×1 IMPLANT
COVER PERINEAL POST (MISCELLANEOUS) ×2 IMPLANT
COVER SURGICAL LIGHT HANDLE (MISCELLANEOUS) ×2 IMPLANT
COVER WAND RF STERILE (DRAPES) ×2 IMPLANT
DRAPE C-ARM 42X72 X-RAY (DRAPES) ×2 IMPLANT
DRAPE STERI IOBAN 125X83 (DRAPES) ×2 IMPLANT
DRAPE U-SHAPE 47X51 STRL (DRAPES) ×6 IMPLANT
DRSG AQUACEL AG ADV 3.5X10 (GAUZE/BANDAGES/DRESSINGS) ×2 IMPLANT
DRSG OPSITE POSTOP 4X6 (GAUZE/BANDAGES/DRESSINGS) ×2 IMPLANT
DURAPREP 26ML APPLICATOR (WOUND CARE) ×2 IMPLANT
ELECT BLADE 4.0 EZ CLEAN MEGAD (MISCELLANEOUS) ×2
ELECT REM PT RETURN 9FT ADLT (ELECTROSURGICAL) ×2
ELECTRODE BLDE 4.0 EZ CLN MEGD (MISCELLANEOUS) ×1 IMPLANT
ELECTRODE REM PT RTRN 9FT ADLT (ELECTROSURGICAL) ×1 IMPLANT
ELIMINATOR HOLE APEX DEPUY (Hips) ×1 IMPLANT
FACESHIELD WRAPAROUND (MASK) ×4 IMPLANT
FACESHIELD WRAPAROUND OR TEAM (MASK) ×2 IMPLANT
GLOVE BIO SURGEON STRL SZ8 (GLOVE) ×4 IMPLANT
GLOVE BIOGEL PI IND STRL 8 (GLOVE) ×2 IMPLANT
GLOVE BIOGEL PI INDICATOR 8 (GLOVE) ×2
GOWN STRL REUS W/ TWL LRG LVL3 (GOWN DISPOSABLE) ×1 IMPLANT
GOWN STRL REUS W/ TWL XL LVL3 (GOWN DISPOSABLE) ×2 IMPLANT
GOWN STRL REUS W/TWL LRG LVL3 (GOWN DISPOSABLE) ×2
GOWN STRL REUS W/TWL XL LVL3 (GOWN DISPOSABLE) ×4
HEAD FEMORAL 32 CERAMIC (Hips) ×1 IMPLANT
KIT BASIN OR (CUSTOM PROCEDURE TRAY) ×2 IMPLANT
KIT TURNOVER KIT B (KITS) ×2 IMPLANT
LINER ACETABULAR 32X50 (Liner) ×1 IMPLANT
LINER PINN ACET GRIP 50X100 ×1 IMPLANT
MANIFOLD NEPTUNE II (INSTRUMENTS) ×2 IMPLANT
NEEDLE HYPO 22GX1.5 SAFETY (NEEDLE) ×2 IMPLANT
NS IRRIG 1000ML POUR BTL (IV SOLUTION) ×2 IMPLANT
PACK TOTAL JOINT (CUSTOM PROCEDURE TRAY) ×2 IMPLANT
PAD ARMBOARD 7.5X6 YLW CONV (MISCELLANEOUS) ×2 IMPLANT
RETRACTOR WND ALEXIS 18 MED (MISCELLANEOUS) ×1 IMPLANT
RTRCTR WOUND ALEXIS 18CM MED (MISCELLANEOUS) ×2
STEM FEMORAL SZ 5MM STD ACTIS (Stem) ×1 IMPLANT
SUT ETHIBOND NAB CT1 #1 30IN (SUTURE) ×4 IMPLANT
SUT VIC AB 1 CT1 27 (SUTURE) ×2
SUT VIC AB 1 CT1 27XBRD ANBCTR (SUTURE) ×1 IMPLANT
SUT VIC AB 2-0 CT1 27 (SUTURE) ×2
SUT VIC AB 2-0 CT1 TAPERPNT 27 (SUTURE) ×1 IMPLANT
SUT VIC AB 3-0 PS2 18 (SUTURE) ×2
SUT VIC AB 3-0 PS2 18XBRD (SUTURE) ×1 IMPLANT
SUT VLOC 180 0 24IN GS25 (SUTURE) ×2 IMPLANT
SYR 50ML LL SCALE MARK (SYRINGE) ×2 IMPLANT

## 2018-08-21 NOTE — Transfer of Care (Signed)
Immediate Anesthesia Transfer of Care Note  Patient: Abigail [name]  Procedure(s) Performed: TOTAL HIP ARTHROPLASTY ANTERIOR APPROACH (Right Hip)  Patient Location: PACU  Anesthesia Type:General  Level of Consciousness: awake and patient cooperative  Airway & Oxygen Therapy: Patient Spontanous Breathing and Patient connected to nasal cannula oxygen  Post-op Assessment: Report given to RN, Post -op Vital signs reviewed and stable and Patient moving all extremities  Post vital signs: Reviewed and stable  Last Vitals:  Vitals Value Taken Time  BP 127/81 08/21/2018  3:31 PM  Temp 36.4 C 08/21/2018  3:30 PM  Pulse 76 08/21/2018  3:33 PM  Resp 18 08/21/2018  3:33 PM  SpO2 100 % 08/21/2018  3:33 PM  Vitals shown include unvalidated device data.  Last Pain:  Vitals:   08/21/18 1530  TempSrc:   PainSc: (P) 8       Patients Stated Pain Goal: 1 (08/21/18 1036)  Complications: No apparent anesthesia complications

## 2018-08-21 NOTE — Interval H&P Note (Signed)
History and Physical Interval Note:  08/21/2018 12:16 PM  Abigail [name]  has presented today for surgery, with the diagnosis of RIGHT HIP DEGENERATIVE JOINT DISEASE  The various methods of treatment have been discussed with the patient and family. After consideration of risks, benefits and other options for treatment, the patient has consented to  Procedure(s): TOTAL HIP ARTHROPLASTY ANTERIOR APPROACH (Right) as a surgical intervention .  The patient's history has been reviewed, patient examined, no change in status, stable for surgery.  I have reviewed the patient's chart and labs.  Questions were answered to the patient's satisfaction.     [name]

## 2018-08-21 NOTE — Evaluation (Signed)
Physical Therapy Evaluation Patient Details Name: Abigail [name] Sentell MRN: [id number] DOB: 05/18/1946 Today's Date: 08/21/2018   History of Present Illness  Pt is a 72 y/o female s/p elective R THA, direct anterior. PMH includes HTN, CVA, vertigo and back surgery.   Clinical Impression  Pt is s/p surgery above with deficits below. Pt very limited by pain this session and only able to tolerate sitting at EOB. Was able to perform lateral scoots at EOB for repositioning prior to return to supine. Required mod A for bed mobility this session. Anticipate pt will progress well once pain controlled. Will continue to follow acutely to maximize functional mobility independence and safety.     Follow Up Recommendations Follow surgeon's recommendation for DC plan and follow-up therapies;Supervision for mobility/OOB    Equipment Recommendations  3in1 (PT)    Recommendations for Other Services OT consult     Precautions / Restrictions Precautions Precautions: Fall Restrictions Weight Bearing Restrictions: Yes RLE Weight Bearing: Weight bearing as tolerated      Mobility  Bed Mobility Overal bed mobility: Needs Assistance Bed Mobility: Sit to Supine;Supine to Sit;Rolling Rolling: Min assist   Supine to sit: Mod assist Sit to supine: Mod assist   General bed mobility comments: Mod A for RLE assist and trunk elevation. Once sitting EOB, pt with increased pain and crying. Was able to perform lateral scoots along EOB for repositioning in bed. Required mod A to return to supine. Once in supine, pt requesting to turn onto her L side. Positioned pt on L side with pillow between knees.   Transfers                 General transfer comment: Pt refusing at this time.   Ambulation/Gait                Stairs            Wheelchair Mobility    Modified Rankin (Stroke Patients Only)       Balance Overall balance assessment: Needs assistance Sitting-balance support: No upper  extremity supported;Feet supported Sitting balance-Leahy Scale: Fair                                       Pertinent Vitals/Pain Pain Assessment: Faces Faces Pain Scale: Hurts worst Pain Location: R hip with movement  Pain Descriptors / Indicators: Grimacing;Guarding;Operative site guarding;Crying;Moaning Pain Intervention(s): Limited activity within patient's tolerance;Monitored during session;Repositioned    Home Living Family/patient expects to be discharged to:: Private residence Living Arrangements: Spouse/significant other Available Help at Discharge: Family;Available 24 hours/day Type of Home: House Home Access: Stairs to enter Entrance Stairs-Rails: Right;Left;Can reach both Entrance Stairs-Number of Steps: 4 Home Layout: One level Home Equipment: Walker - 2 wheels;Cane - single point      Prior Function Level of Independence: Independent with assistive device(s)         Comments: Used cane for ambulation      Hand Dominance        Extremity/Trunk Assessment   Upper Extremity Assessment Upper Extremity Assessment: Defer to OT evaluation    Lower Extremity Assessment Lower Extremity Assessment: RLE deficits/detail RLE Deficits / Details: Deficits consistent with post op pain and weakness. Required assist to perform hip flexion.     Cervical / Trunk Assessment Cervical / Trunk Assessment: Normal  Communication   Communication: No difficulties  Cognition Arousal/Alertness: Awake/alert Behavior During Therapy: WFL for  tasks assessed/performed Overall Cognitive Status: Within Functional Limits for tasks assessed                                        General Comments General comments (skin integrity, edema, etc.): Pt's spouse present during session.     Exercises     Assessment/Plan    PT Assessment Patient needs continued PT services  PT Problem List Decreased strength;Decreased activity tolerance;Decreased  balance;Decreased mobility;Decreased knowledge of use of DME;Decreased knowledge of precautions;Pain       PT Treatment Interventions Gait training;DME instruction;Functional mobility training;Therapeutic activities;Therapeutic exercise;Balance training;Stair training;Patient/family education    PT Goals (Current goals can be found in the Care Plan section)  Acute Rehab PT Goals Patient Stated Goal: to decrease pain  PT Goal Formulation: With patient Time For Goal Achievement: 09/04/18 Potential to Achieve Goals: Fair    Frequency 7X/week   Barriers to discharge        Co-evaluation               AM-PAC PT "6 Clicks" Mobility  Outcome Measure Help needed turning from your back to your side while in a flat bed without using bedrails?: A Little Help needed moving from lying on your back to sitting on the side of a flat bed without using bedrails?: A Lot Help needed moving to and from a bed to a chair (including a wheelchair)?: A Lot Help needed standing up from a chair using your arms (e.[name]., wheelchair or bedside chair)?: A Lot Help needed to walk in hospital room?: A Lot Help needed climbing 3-5 steps with a railing? : A Lot 6 Click Score: 13    End of Session   Activity Tolerance: Patient limited by pain Patient left: in bed;with call bell/phone within reach;with family/visitor present;with nursing/sitter in room Nurse Communication: Mobility status;Patient requests pain meds PT Visit Diagnosis: Other abnormalities of gait and mobility (R26.89);Muscle weakness (generalized) (M62.81);Difficulty in walking, not elsewhere classified (R26.2);Pain Pain - Right/Left: Right Pain - part of body: Hip    Time: 1610-9604 PT Time Calculation (min) (ACUTE ONLY): 23 min   Charges:   PT Evaluation $PT Eval Moderate Complexity: 1 Mod PT Treatments $Therapeutic Activity: 8-22 mins        Gladys Damme, PT, DPT  Acute Rehabilitation Services  Pager: [phone] Office: [phone]   Lehman Prom 08/21/2018, 5:27 PM

## 2018-08-21 NOTE — Op Note (Signed)
PRE-OP DIAGNOSIS:  RIGHT HIP DEGENERATIVE JOINT DISEASE POST-OP DIAGNOSIS:  same PROCEDURE: RIGHT TOTAL HIP ARTHROPLASTY ANTERIOR APPROACH ANESTHESIA:  General SURGEON:  Marcene CorningPeter Deyton Ellenbecker MD ASSISTANT:  Elodia FlorenceAndrew Nida PA-C   INDICATIONS FOR PROCEDURE:  The patient is a 72 y.o. female with a long history of a painful hip.  This has persisted despite multiple conservative measures.  The patient has persisted with pain and dysfunction making rest and activity difficult.  A total hip replacement is offered as surgical treatment.  Informed operative consent was obtained after discussion of possible complications including reaction to anesthesia, infection, neurovascular injury, dislocation, DVT, PE, and death.  The importance of the postoperative rehab program to optimize result was stressed with the patient.  SUMMARY OF FINDINGS AND PROCEDURE:  Under the above anesthesia through a anterior approach an the Hana table a right THR was performed.  The patient had severe degenerative change and good bone quality.  We used DePuy components to replace the hip and these were size 5 Actis femur capped with a +1 32mm ceramic hip ball.  On the acetabular side we used a size 50 Gription shell with a  plus 0 neutral polyethylene liner.  We did use a hole eliminator.  Elodia FlorenceAndrew Nida PA-C assisted throughout and was invaluable to the completion of the case in that he helped position and retract while I performed the procedure.  He also closed simultaneously to help minimize OR time.  I used fluoroscopy throughout the case to check position of implants and leg lengths and read all of these views myself.  DESCRIPTION OF PROCEDURE:  The patient was taken to the OR suite where the above anesthetic was applied.  The patient was then positioned on the Hana table supine.  All bony prominences were appropriately padded.  Prep and drape was then performed in normal sterile fashion.  The patient was given vancomycin preoperative antibiotic  and an appropriate time out was performed.  We then took an anterior approach to the right hip.  Dissection was taken through adipose to the tensor fascia lata fascia.  This structure was incised longitudinally and we dissected in the intermuscular interval just medial to this muscle.  Cobra retractors were placed superior and inferior to the femoral neck superficial to the capsule.  A capsular incision was then made and the retractors were placed along the femoral neck.  Xray was brought in to get a good level for the femoral neck cut which was made with an oscillating saw and osteotome.  The femoral head was removed with a corkscrew.  The acetabulum was exposed and some labral tissues were excised. Reaming was taken to the inside wall of the pelvis and sequentially up to 1 mm smaller than the actual component.  A trial of components was done and then the aforementioned acetabular shell was placed in appropriate tilt and anteversion confirmed by fluoroscopy. The liner was placed along with the hole eliminator and attention was turned to the femur.  The leg was brought down and over into adduction and the elevator bar was used to raise the femur up gently in the wound.  The piriformis was released with care taken to preserve the obturator internus attachment and all of the posterior capsule. The femur was reamed and then broached to the appropriate size.  A trial reduction was done and the aforementioned head and neck assembly gave us the best stability in extension with external rotation.  Leg lengths were felt to be about equal by fluoroscopic  exam.  The trial components were removed and the wound irrigated.  We then placed the femoral component in appropriate anteversion.  The head was applied to a dry stem neck and the hip again reduced.  It was again stable in the aforementioned position.  The would was irrigated again followed by re-approximation of anterior capsule with ethibond suture. Tensor fascia was  repaired with V-loc suture  followed by deep closure with #O and #2 undyed vicryl.  Skin was closed with subQ stitch and steristrips followed by a sterile dressing.  EBL and IOF can be obtained from anesthesia records.  DISPOSITION:  The patient was extubated in the OR and taken to PACU in stable condition to be admitted to the Orthopedic Surgery for appropriate post-op care to include perioperative antibiotics and DVT prophylaxis.

## 2018-08-21 NOTE — Anesthesia Postprocedure Evaluation (Signed)
Anesthesia Post Note  Patient: Abigail [name]  Procedure(s) Performed: TOTAL HIP ARTHROPLASTY ANTERIOR APPROACH (Right Hip)     Patient location during evaluation: PACU Anesthesia Type: Spinal Level of consciousness: awake and alert Pain management: pain level controlled Vital Signs Assessment: post-procedure vital signs reviewed and stable Respiratory status: spontaneous breathing, nonlabored ventilation and respiratory function stable Cardiovascular status: blood pressure returned to baseline and stable Postop Assessment: no apparent nausea or vomiting Anesthetic complications: no    Last Vitals:  Vitals:   08/21/18 1615 08/21/18 1634  BP: (!) 150/79 (!) 153/77  Pulse: 66 75  Resp: 12 18  Temp: (!) 36.4 C 36.8 C  SpO2: 100% 95%    Last Pain:  Vitals:   08/21/18 1634  TempSrc: Oral  PainSc:                  Kaylyn LayerKathryn E Ruble Pumphrey

## 2018-08-21 NOTE — Plan of Care (Signed)

## 2018-08-21 NOTE — Anesthesia Procedure Notes (Signed)
Procedure Name: Intubation Date/Time: 08/21/2018 1:38 PM Performed by: Moshe Salisbury, CRNA Pre-anesthesia Checklist: Patient identified, Emergency Drugs available, Suction available and Patient being monitored Patient Re-evaluated:Patient Re-evaluated prior to induction Oxygen Delivery Method: Circle System Utilized Preoxygenation: Pre-oxygenation with 100% oxygen Induction Type: IV induction Ventilation: Mask ventilation without difficulty Laryngoscope Size: Mac and 3 Grade View: Grade I Tube type: Oral Tube size: 7.0 mm Number of attempts: 1 Airway Equipment and Method: Stylet Placement Confirmation: ETT inserted through vocal cords under direct vision,  positive ETCO2 and breath sounds checked- equal and bilateral Secured at: 21 cm Tube secured with: Tape Dental Injury: Teeth and Oropharynx as per pre-operative assessment

## 2018-08-22 ENCOUNTER — Encounter (HOSPITAL_COMMUNITY): Payer: Self-pay | Admitting: Orthopaedic Surgery

## 2018-08-22 LAB — BASIC METABOLIC PANEL
Anion gap: 8 (ref 5–15)
BUN: 13 mg/dL (ref 8–23)
CO2: 24 mmol/L (ref 22–32)
Calcium: 8.8 mg/dL — ABNORMAL LOW (ref 8.9–10.3)
Chloride: 105 mmol/L (ref 98–111)
Creatinine, Ser: 0.91 mg/dL (ref 0.44–1.00)
GFR calc Af Amer: >60 mL/min (ref >60)
GFR calc non Af Amer: >60 mL/min (ref >60)
Glucose, Bld: 149 mg/dL — ABNORMAL HIGH (ref 70–99)
Potassium: 4.1 mmol/L (ref 3.5–5.1)
Sodium: 137 mmol/L (ref 135–145)

## 2018-08-22 LAB — CBC
HCT: 30 % — ABNORMAL LOW (ref 36.0–46.0)
Hemoglobin: 9.7 g/dL — ABNORMAL LOW (ref 12.0–15.0)
MCH: 29 pg (ref 26.0–34.0)
MCHC: 32.3 g/dL (ref 30.0–36.0)
MCV: 89.8 fL (ref 80.0–100.0)
Platelets: 122 10*3/uL — ABNORMAL LOW (ref 150–400)
RBC: 3.34 MIL/uL — ABNORMAL LOW (ref 3.87–5.11)
RDW: 12.7 % (ref 11.5–15.5)
WBC: 7.8 10*3/uL (ref 4.0–10.5)
nRBC: 0 % (ref 0.0–0.2)

## 2018-08-22 MED ORDER — TIZANIDINE HCL 4 MG PO TABS: 4.0000 mg | ORAL_TABLET | Freq: Four times a day (QID) | ORAL | 1 refills | Status: DC | PRN

## 2018-08-22 MED ORDER — TRAMADOL HCL 50 MG PO TABS: 50.0000 mg | ORAL_TABLET | Freq: Four times a day (QID) | ORAL | 0 refills | Status: DC | PRN

## 2018-08-22 MED ORDER — ASPIRIN 325 MG PO TBEC: 325.0000 mg | DELAYED_RELEASE_TABLET | Freq: Two times a day (BID) | ORAL | 0 refills | Status: DC

## 2018-08-22 NOTE — Progress Notes (Signed)
Physical Therapy Treatment Patient Details Name: Abigail [name] MRN: [id number] DOB: 11/03/1945 Today's Date: 08/22/2018    History of Present Illness Pt is a 72 y/o female s/p elective R THA, direct anterior. PMH includes HTN, CVA, vertigo and back surgery.     PT Comments    Patient seen for mobility progression. Pt with a lot of R hip pain but pt premedicated and agreeable to participate in therapy. Pt requires mod A for bed mobility and mod A +2 for safety with sit to stand and stand pivot transfers. Continue to progress as tolerated and attempt gait training next session.     Follow Up Recommendations  Follow surgeon's recommendation for DC plan and follow-up therapies;Supervision for mobility/OOB     Equipment Recommendations  3in1 (PT)    Recommendations for Other Services OT consult     Precautions / Restrictions Precautions Precautions: Fall Restrictions Weight Bearing Restrictions: Yes RLE Weight Bearing: Weight bearing as tolerated    Mobility  Bed Mobility Overal bed mobility: Needs Assistance Bed Mobility: Supine to Sit     Supine to sit: Mod assist;HOB elevated     General bed mobility comments: assist to bring R LE and R hip to EOB with use of bed pad; use of rail; cues for sequencing   Transfers Overall transfer level: Needs assistance Equipment used: Rolling walker (2 wheeled) Transfers: Sit to/from [employer] Sit to Stand: Mod assist;From elevated surface;+2 safety/equipment Stand pivot transfers: Mod assist;+2 safety/equipment       General transfer comment: cues for safe hand placement, posture, use of bilat UE, and sequencing; assist to power up into standing and then for balance and guiding RW for pivot to recliner  Ambulation/Gait             General Gait Details: pt limiting ability to progress to gait training   Stairs             Wheelchair Mobility    Modified Rankin (Stroke Patients Only)        Balance Overall balance assessment: Needs assistance Sitting-balance support: No upper extremity supported;Feet supported Sitting balance-Leahy Scale: Fair   Postural control: Left lateral lean(away from painful R hip) Standing balance support: Bilateral upper extremity supported;During functional activity Standing balance-Leahy Scale: Poor                              Cognition Arousal/Alertness: Awake/alert Behavior During Therapy: WFL for tasks assessed/performed Overall Cognitive Status: Within Functional Limits for tasks assessed                                        Exercises Total Joint Exercises Ankle Circles/Pumps: AROM;Both Quad Sets: AROM;Both Heel Slides: AAROM;Right;5 reps Hip ABduction/ADduction: AAROM;Right;5 reps    General Comments General comments (skin integrity, edema, etc.): husband present and assisted to stand      Pertinent Vitals/Pain Pain Assessment: Faces Faces Pain Scale: Hurts worst Pain Location: R hip with movement  Pain Descriptors / Indicators: Grimacing;Guarding;Operative site guarding;Crying;Moaning;Burning Pain Intervention(s): Limited activity within patient's tolerance;Monitored during session;Premedicated before session;Repositioned;Ice applied;RN gave pain meds during session    Home Living                      Prior Function            PT Goals (  current goals can now be found in the care plan section) Acute Rehab PT Goals Patient Stated Goal: to decrease pain  Progress towards PT goals: Progressing toward goals    Frequency    7X/week      PT Plan Current plan remains appropriate    Co-evaluation              AM-PAC PT "6 Clicks" Mobility   Outcome Measure  Help needed turning from your back to your side while in a flat bed without using bedrails?: A Lot Help needed moving from lying on your back to sitting on the side of a flat bed without using bedrails?: A Lot Help  needed moving to and from a bed to a chair (including a wheelchair)?: A Lot Help needed standing up from a chair using your arms (e.g., wheelchair or bedside chair)?: A Lot Help needed to walk in hospital room?: A Lot Help needed climbing 3-5 steps with a railing? : A Lot 6 Click Score: 12    End of Session Equipment Utilized During Treatment: Gait belt Activity Tolerance: Patient limited by pain Patient left: with call bell/phone within reach;with family/visitor present;in chair Nurse Communication: Mobility status PT Visit Diagnosis: Other abnormalities of gait and mobility (R26.89);Muscle weakness (generalized) (M62.81);Difficulty in walking, not elsewhere classified (R26.2);Pain Pain - Right/Left: Right Pain - part of body: Hip     Time: 1110-1145 PT Time Calculation (min) (ACUTE ONLY): 35 min  Charges:  $Therapeutic Activity: 23-37 mins                     Erline LevineKellyn Anees Vanecek, PTA Acute Rehabilitation Services Pager: [phone] Office: [phone]     Carolynne EdouardKellyn R Treva Huyett 08/22/2018, 2:03 PM

## 2018-08-22 NOTE — Progress Notes (Signed)
Physical Therapy Treatment Patient Details Name: Abigail [name] G Wetsel MRN: [id number] DOB: 09/12/1946 Today's Date: 08/22/2018    History of Present Illness Pt is a 72 y/o female s/p elective R THA, direct anterior. PMH includes HTN, CVA, vertigo and back surgery.     PT Comments    Patient seen for mobility progression. Pt requires mod A +2 for sit to stand transfers and mod A (+2 for safety/chair follow) for gait training. Pt tolerated short distance gait training with max cues. Pt presents with difficulty advancing R LE but is improving with more mobility. Continue to progress as tolerated.    Follow Up Recommendations  Follow surgeon's recommendation for DC plan and follow-up therapies;Supervision for mobility/OOB     Equipment Recommendations  3in1 (PT)    Recommendations for Other Services OT consult     Precautions / Restrictions Precautions Precautions: Fall Restrictions Weight Bearing Restrictions: Yes RLE Weight Bearing: Weight bearing as tolerated    Mobility  Bed Mobility Overal bed mobility: Needs Assistance Bed Mobility: Supine to Sit     Supine to sit: Mod assist;HOB elevated     General bed mobility comments: pt OOB in chair upon arrival  Transfers Overall transfer level: Needs assistance Equipment used: Rolling walker (2 wheeled) Transfers: Sit to/from Stand Sit to Stand: Mod assist;+2 physical assistance Stand pivot transfers: Mod assist;+2 safety/equipment       General transfer comment: assist to power up into standing; cues for safe hand placement  Ambulation/Gait Ambulation/Gait assistance: Mod assist;+2 safety/equipment Gait Distance (Feet): 16 Feet Assistive device: Rolling walker (2 wheeled) Gait Pattern/deviations: Step-to pattern;Decreased stance time - right;Decreased step length - left;Decreased dorsiflexion - right;Decreased weight shift to right;Antalgic;Trunk flexed Gait velocity: slow   General Gait Details: max multimodal cues for  posture, R hip flexion during swing phase and R quad activation during stance phase; pt with difficulty advancing R LE intially requiring physical assist to bring R LE forward; limited foot clearance; assist to weight shift, balance, and guide RW   Stairs             Wheelchair Mobility    Modified Rankin (Stroke Patients Only)       Balance Overall balance assessment: Needs assistance Sitting-balance support: No upper extremity supported;Feet supported Sitting balance-Leahy Scale: Fair   Postural control: Left lateral lean(away from painful R hip) Standing balance support: Bilateral upper extremity supported;During functional activity Standing balance-Leahy Scale: Poor                              Cognition Arousal/Alertness: Awake/alert Behavior During Therapy: WFL for tasks assessed/performed Overall Cognitive Status: Within Functional Limits for tasks assessed                                        Exercises Total Joint Exercises Ankle Circles/Pumps: AROM;Both Quad Sets: AROM;Both Heel Slides: AAROM;Right;5 reps Hip ABduction/ADduction: AAROM;Right;5 reps    General Comments General comments (skin integrity, edema, etc.): husband present and assisted to stand      Pertinent Vitals/Pain Pain Assessment: Faces Faces Pain Scale: Hurts even more Pain Location: R hip with movement  Pain Descriptors / Indicators: Grimacing;Guarding;Operative site guarding;Crying;Moaning;Burning Pain Intervention(s): Limited activity within patient's tolerance;Repositioned;Premedicated before session;Monitored during session;Ice applied    Home Living  Prior Function            PT Goals (current goals can now be found in the care plan section) Acute Rehab PT Goals Patient Stated Goal: to decrease pain  Progress towards PT goals: Progressing toward goals    Frequency    7X/week      PT Plan Current plan  remains appropriate    Co-evaluation PT/OT/SLP Co-Evaluation/Treatment: Yes Reason for Co-Treatment: For patient/therapist safety;To address functional/ADL transfers PT goals addressed during session: Mobility/safety with mobility;Proper use of DME;Strengthening/ROM        AM-PAC PT "6 Clicks" Mobility   Outcome Measure  Help needed turning from your back to your side while in a flat bed without using bedrails?: A Lot Help needed moving from lying on your back to sitting on the side of a flat bed without using bedrails?: A Lot Help needed moving to and from a bed to a chair (including a wheelchair)?: A Lot Help needed standing up from a chair using your arms (e.g., wheelchair or bedside chair)?: A Lot Help needed to walk in hospital room?: A Lot Help needed climbing 3-5 steps with a railing? : A Lot 6 Click Score: 12    End of Session Equipment Utilized During Treatment: Gait belt Activity Tolerance: Patient limited by pain Patient left: with call bell/phone within reach;in chair Nurse Communication: Mobility status PT Visit Diagnosis: Other abnormalities of gait and mobility (R26.89);Muscle weakness (generalized) (M62.81);Difficulty in walking, not elsewhere classified (R26.2);Pain Pain - Right/Left: Right Pain - part of body: Hip     Time: 1429-1500 PT Time Calculation (min) (ACUTE ONLY): 31 min  Charges:  $Gait Training: 8-22 mins                      Erline Levine, PTA Acute Rehabilitation Services Pager: [phone] Office: [phone]     Carolynne Edouard 08/22/2018, 4:34 PM

## 2018-08-22 NOTE — Progress Notes (Signed)
Subjective: 1 Day Post-Op Procedure(s) (LRB): TOTAL HIP ARTHROPLASTY ANTERIOR APPROACH (Right)   Patient resting comfortably in bed with her son at bedside. Her pain is improving since yesterday.  Activity level:  wbat Diet tolerance:  ok Voiding:  ok Patient reports pain as mild and moderate.    Objective: Vital signs in last 24 hours: Temp:  [97.5 F (36.4 C)-99 F (37.2 C)] 99 F (37.2 C) (12/04 0505) Pulse Rate:  [61-75] 63 (12/04 0505) Resp:  [11-19] 16 (12/04 0505) BP: (119-159)/(64-86) 119/64 (12/04 0505) SpO2:  [95 %-100 %] 99 % (12/04 0505) Weight:  [81.8 kg] 81.8 kg (12/03 1036)  Labs: Recent Labs    08/22/18 0214  HGB 9.7*   Recent Labs    08/22/18 0214  WBC 7.8  RBC 3.34*  HCT 30.0*  PLT 122*   Recent Labs    08/22/18 0214  NA 137  K 4.1  CL 105  CO2 24  BUN 13  CREATININE 0.91  GLUCOSE 149*  CALCIUM 8.8*   No results for input(s): LABPT, INR in the last 72 hours.  Physical Exam:  Neurologically intact ABD soft Neurovascular intact Sensation intact distally Intact pulses distally Dorsiflexion/Plantar flexion intact Incision: dressing C/D/I and no drainage No cellulitis present Compartment soft  Assessment/Plan:  1 Day Post-Op Procedure(s) (LRB): TOTAL HIP ARTHROPLASTY ANTERIOR APPROACH (Right) Advance diet Up with therapy Plan for discharge tomorrow Discharge home with home health  We will add 325mg  ASA BID to her current plavix for DVT prevention. Follow up in office 2 weeks post op. Keep bandage clean and dry until follow up.  Abigail [name], Abigail [name] 08/22/2018, 8:01 AM

## 2018-08-22 NOTE — Evaluation (Signed)
Occupational Therapy Evaluation Patient Details Name: Abigail [name] MRN: [id number] DOB: 10-14-1945 Today's Date: 08/22/2018    History of Present Illness Pt is a 72 y/o female s/p elective R THA, direct anterior. PMH includes HTN, CVA, vertigo and back surgery.    Clinical Impression   Pt is currently mod A +2 for transfers, once standing, mod A for in room mobility or functional tasks. Pt is max A for LB ADL at this time. Pt will require continued OT in the acute setting as well as afterwards to maximize safety and independence in ADL and functional transfers. Next session to focus on AE education for LB ADL and transfers. Pain was a big limiting factor today - please pre-medicate.     Follow Up Recommendations  Follow surgeon's recommendation for DC plan and follow-up therapies;Supervision/Assistance - 24 hour    Equipment Recommendations  3 in 1 bedside commode    Recommendations for Other Services       Precautions / Restrictions Precautions Precautions: Fall Restrictions Weight Bearing Restrictions: Yes RLE Weight Bearing: Weight bearing as tolerated      Mobility Bed Mobility Overal bed mobility: Needs Assistance Bed Mobility: Supine to Sit     Supine to sit: Mod assist;HOB elevated     General bed mobility comments: pt OOB in chair upon arrival  Transfers Overall transfer level: Needs assistance Equipment used: Rolling walker (2 wheeled) Transfers: Sit to/from Stand Sit to Stand: Mod assist;+2 physical assistance Stand pivot transfers: Mod assist;+2 safety/equipment       General transfer comment: assist to power up into standing; cues for safe hand placement    Balance Overall balance assessment: Needs assistance Sitting-balance support: No upper extremity supported;Feet supported Sitting balance-Leahy Scale: Fair   Postural control: Left lateral lean(away from painful R hip) Standing balance support: Bilateral upper extremity supported;During functional  activity Standing balance-Leahy Scale: Poor                             ADL either performed or assessed with clinical judgement   ADL Overall ADL's : Needs assistance/impaired Eating/Feeding: Set up;Sitting   Grooming: Set up;Sitting Grooming Details (indicate cue type and reason): unable to maintain standing without BUE support for grooming tasks at this time Upper Body Bathing: Set up;Sitting   Lower Body Bathing: Maximal assistance;Sitting/lateral leans Lower Body Bathing Details (indicate cue type and reason): unable to bring feet cross to knees Upper Body Dressing : Set up;Sitting   Lower Body Dressing: Maximal assistance;+2 for physical assistance;+2 for safety/equipment;Sit to/from stand Lower Body Dressing Details (indicate cue type and reason): unable to bring feet cross to knees, struggles with weight shifting onto right hip at this time Toilet Transfer: Moderate assistance;+2 for physical assistance;+2 for safety/equipment;Ambulation;RW Toilet Transfer Details (indicate cue type and reason): increased time and effort required Toileting- Clothing Manipulation and Hygiene: Maximal assistance;+2 for physical assistance;+2 for safety/equipment;Sit to/from stand       Functional mobility during ADLs: Maximal assistance;+2 for physical assistance;+2 for safety/equipment;Rolling walker       Vision         Perception     Praxis      Pertinent Vitals/Pain Pain Assessment: Faces Faces Pain Scale: Hurts even more Pain Location: R hip with movement  Pain Descriptors / Indicators: Grimacing;Guarding;Operative site guarding;Crying;Moaning;Burning Pain Intervention(s): Monitored during session;Repositioned;Ice applied;Premedicated before session     Hand Dominance Right   Extremity/Trunk Assessment Upper Extremity Assessment Upper Extremity Assessment:  Overall [facility] for tasks assessed   Lower Extremity Assessment Lower Extremity Assessment: Defer to PT  evaluation;RLE deficits/detail RLE Deficits / Details: Deficits consistent with post op pain and weakness. Required assist to perform hip flexion.    Cervical / Trunk Assessment Cervical / Trunk Assessment: Normal   Communication Communication Communication: No difficulties   Cognition Arousal/Alertness: Awake/alert Behavior During Therapy: WFL for tasks assessed/performed Overall Cognitive Status: Within Functional Limits for tasks assessed                                     General Comments  husband present and assisted to stand    Exercises Exercises: Total Joint Total Joint Exercises Ankle Circles/Pumps: AROM;Both Quad Sets: AROM;Both Heel Slides: AAROM;Right;5 reps Hip ABduction/ADduction: AAROM;Right;5 reps   Shoulder Instructions      Home Living Family/patient expects to be discharged to:: Private residence Living Arrangements: Spouse/significant other Available Help at Discharge: Family;Available 24 hours/day Type of Home: House Home Access: Stairs to enter [employer] of Steps: 4 Entrance Stairs-Rails: Right;Left;Can reach both Home Layout: One level     Bathroom Shower/Tub: Chief Strategy Officer: Standard     Home Equipment: Environmental consultant - 2 wheels;Cane - single point   Additional Comments: drives      Prior Functioning/Environment Level of Independence: Independent with assistive device(s)        Comments: Used cane for ambulation         OT Problem List: Decreased strength;Decreased range of motion;Decreased activity tolerance;Impaired balance (sitting and/or standing);Decreased safety awareness;Decreased knowledge of use of DME or AE;Decreased knowledge of precautions;Pain      OT Treatment/Interventions: Self-care/ADL training;DME and/or AE instruction;Therapeutic activities;Patient/family education;Balance training    OT Goals(Current goals can be found in the care plan section) Acute Rehab OT  Goals Patient Stated Goal: get back to gardening OT Goal Formulation: With patient Time For Goal Achievement: 09/05/18 Potential to Achieve Goals: Good ADL Goals Pt Will Perform Grooming: with supervision;standing Pt Will Perform Lower Body Bathing: with supervision;with adaptive equipment;sitting/lateral leans Pt Will Perform Lower Body Dressing: with min guard assist;sit to/from stand Pt Will Transfer to Toilet: with supervision;ambulating Pt Will Perform Toileting - Clothing Manipulation and hygiene: with supervision;sit to/from stand  OT Frequency: Min 2X/week   Barriers to D/C:            Co-evaluation PT/OT/SLP Co-Evaluation/Treatment: Yes Reason for Co-Treatment: For patient/therapist safety;To address functional/ADL transfers PT goals addressed during session: Mobility/safety with mobility;Balance;Proper use of DME OT goals addressed during session: ADL's and self-care;Proper use of Adaptive equipment and DME;Strengthening/ROM      AM-PAC OT "6 Clicks" Daily Activity     Outcome Measure Help from another person eating meals?: None Help from another person taking care of personal grooming?: A Little Help from another person toileting, which includes using toliet, bedpan, or urinal?: A Lot Help from another person bathing (including washing, rinsing, drying)?: A Lot Help from another person to put on and taking off regular upper body clothing?: A Little Help from another person to put on and taking off regular lower body clothing?: A Lot 6 Click Score: 16   End of Session Equipment Utilized During Treatment: Gait belt;Rolling walker Nurse Communication: Mobility status  Activity Tolerance: Patient tolerated treatment well Patient left: in chair;with call bell/phone within reach;with chair alarm set  OT Visit Diagnosis: Unsteadiness on feet (R26.81);Other abnormalities of gait and mobility (R26.89);Pain  Pain - Right/Left: Right Pain - part of body: Hip                 Time: 1429-1500 OT Time Calculation (min): 31 min Charges:  OT General Charges $OT Visit: 1 Visit OT Evaluation $OT Eval Moderate Complexity: 1 Mod  Sherryl MangesLaura Kenyon Eichelberger OTR/L Acute Rehabilitation Services Pager: [phone] Office: [phone]  Evern BioLaura J Lashann Hagg 08/22/2018, 5:28 PM

## 2018-08-23 LAB — CBC
HCT: 27.8 % — ABNORMAL LOW (ref 36.0–46.0)
Hemoglobin: 9.1 g/dL — ABNORMAL LOW (ref 12.0–15.0)
MCH: 29.4 pg (ref 26.0–34.0)
MCHC: 32.7 g/dL (ref 30.0–36.0)
MCV: 89.7 fL (ref 80.0–100.0)
Platelets: 106 10*3/uL — ABNORMAL LOW (ref 150–400)
RBC: 3.1 MIL/uL — ABNORMAL LOW (ref 3.87–5.11)
RDW: 12.9 % (ref 11.5–15.5)
WBC: 8.5 10*3/uL (ref 4.0–10.5)
nRBC: 0 % (ref 0.0–0.2)

## 2018-08-23 NOTE — Care Management Note (Signed)
Case Management Note  Patient Details  Name: Abigail [name] MRN: [id number] Date of Birth: 01/10/1946  Subjective/Objective:    72 yr old female s/p right total hip arthroplasty.                Action/Plan:  Case manager spoke with patient concerning discharge plan and DME. [facility]. [facility]&P, OP Notes, PT eval, [facility] [phone]   Expected Discharge Date:     pending             Expected Discharge Plan:  Home w Home Health Services  In-House Referral:  NA  Discharge planning Services  CM Consult  Post Acute Care Choice:  Durable Medical Equipment, Home Health Choice offered to:  Patient  DME Arranged:  3-N-1(has RW) DME Agency:  [employer]  HH Arranged:  PT [facility]  Status of Service:  Completed, signed off  If discussed at Long Length of Stay Meetings, dates discussed:    Additional Comments:  Durenda GuthrieBrady, Tonnie Friedel Naomi, RN 08/23/2018, 11:09 AM

## 2018-08-23 NOTE — Progress Notes (Signed)
Subjective: 2 Days Post-Op Procedure(s) (LRB): TOTAL HIP ARTHROPLASTY ANTERIOR APPROACH (Right)   Patient is tearful this morning as she states that she wants to get up and move but her right leg is too heavy and painful. Her pain is mostly down in her thigh. She has no real groin pain.  Activity level:  wbat Diet tolerance:  ok Voiding:  ok Patient reports pain as moderate.    Objective: Vital signs in last 24 hours: Temp:  [98.9 F (37.2 C)-101.1 F (38.4 C)] 99.4 F (37.4 C) (12/05 0328) Pulse Rate:  [65-77] 70 (12/05 0328) Resp:  [16] 16 (12/05 0328) BP: (113-125)/(60-70) 121/63 (12/05 0328) SpO2:  [94 %-99 %] 94 % (12/05 0328)  Labs: Recent Labs    08/22/18 0214 08/23/18 0200  HGB 9.7* 9.1*   Recent Labs    08/22/18 0214 08/23/18 0200  WBC 7.8 8.5  RBC 3.34* 3.10*  HCT 30.0* 27.8*  PLT 122* 106*   Recent Labs    08/22/18 0214  NA 137  K 4.1  CL 105  CO2 24  BUN 13  CREATININE 0.91  GLUCOSE 149*  CALCIUM 8.8*   No results for input(s): LABPT, INR in the last 72 hours.  Physical Exam:  Neurologically intact ABD soft Neurovascular intact Sensation intact distally Intact pulses distally Dorsiflexion/Plantar flexion intact Incision: dressing C/D/I and no drainage No cellulitis present Compartment soft  Assessment/Plan:  2 Days Post-Op Procedure(s) (LRB): TOTAL HIP ARTHROPLASTY ANTERIOR APPROACH (Right) Advance diet Up with therapy Plan for discharge tomorrow Discharge home with home health if doing well and cleared by PT. Continue on 325mg  ASA BID x 4 weeks post op along with home Plavix for DVT prevention. Follow up in office 2 weeks post op. Keep bandage clean and dry until follow up.  Gevork Ayyad, Ginger OrganNDREW PAUL 08/23/2018, 6:27 AM

## 2018-08-23 NOTE — Progress Notes (Signed)
Occupational Therapy Treatment Patient Details Name: Abigail [name] MRN: [id number] DOB: 05-Mar-1946 Today's Date: 08/23/2018    History of present illness Pt is a 72 y/o female s/p elective R THA, direct anterior. PMH includes HTN, CVA, vertigo and back surgery.    OT comments  Pt progressing towards OT goals this session. She continues to experience severe pain, but pushes through it to participate in therapy. Pt was able to perform toilet transfers at min guard assist with cues for safety and managing the RW, she was set up for peri care. Then she was able to perform grooming in seated position (she was not able to maintain standing due to pain and balance deficits) and short ambulation back to the recliner. Pt is so motivated, but very limited in her ability to function without significant assist. OT will continue to follow acutely.    Follow Up Recommendations  Follow surgeon's recommendation for DC plan and follow-up therapies;Supervision/Assistance - 24 hour    Equipment Recommendations  3 in 1 bedside commode(in room)    Recommendations for Other Services      Precautions / Restrictions Precautions Precautions: Fall Restrictions Weight Bearing Restrictions: Yes RLE Weight Bearing: Weight bearing as tolerated       Mobility Bed Mobility               General bed mobility comments: In recliner at beginning and end of session  Transfers Overall transfer level: Needs assistance Equipment used: Rolling walker (2 wheeled) Transfers: Sit to/from Stand Sit to Stand: Min guard;Min assist;+2 safety/equipment         General transfer comment: min guard assist for balance and slight boost with improvement throughout session    Balance Overall balance assessment: Needs assistance Sitting-balance support: No upper extremity supported;Feet supported Sitting balance-Leahy Scale: Fair   Postural control: Left lateral lean(away from painful R hip) Standing balance support:  Bilateral upper extremity supported;During functional activity Standing balance-Leahy Scale: Poor Standing balance comment: unable to maintain standing without BUE support                           ADL either performed or assessed with clinical judgement   ADL Overall ADL's : Needs assistance/impaired     Grooming: Wash/dry hands;Wash/dry face;Oral care;Set up;Sitting Grooming Details (indicate cue type and reason): unable to maintain standing and perform BUE tasks, requires BUE support to maintain static standing                 Toilet Transfer: Minimal assistance;+2 for safety/equipment;Ambulation;RW(BSC over commode for arm rests and increased height) Toilet Transfer Details (indicate cue type and reason): increased time and effort required Toileting- Clothing Manipulation and Hygiene: Set up;Sitting/lateral lean Toileting - Clothing Manipulation Details (indicate cue type and reason): able to perform front peri care     Functional mobility during ADLs: Minimal assistance;Min guard;Rolling walker;Cueing for sequencing General ADL Comments: pain very limiting and fatiging     Vision       Perception     Praxis      Cognition Arousal/Alertness: Awake/alert Behavior During Therapy: WFL for tasks assessed/performed Overall Cognitive Status: Within Functional Limits for tasks assessed                                          Exercises     Shoulder Instructions  General Comments      Pertinent Vitals/ Pain       Pain Assessment: 0-10 Pain Score: 10-Worst pain ever Pain Location: R hip with movement  Pain Descriptors / Indicators: Grimacing;Guarding;Moaning;Sharp;Burning Pain Intervention(s): Monitored during session;Repositioned;Ice applied  Home Living                                          Prior Functioning/Environment              Frequency  Min 2X/week        Progress Toward Goals  OT  Goals(current goals can now be found in the care plan section)  Progress towards OT goals: Progressing toward goals  Acute Rehab OT Goals Patient Stated Goal: to decrease pain  OT Goal Formulation: With patient Time For Goal Achievement: 09/05/18 Potential to Achieve Goals: Good  Plan Discharge plan remains appropriate;Frequency remains appropriate    Co-evaluation                 AM-PAC OT "6 Clicks" Daily Activity     Outcome Measure   Help from another person eating meals?: None Help from another person taking care of personal grooming?: A Little Help from another person toileting, which includes using toliet, bedpan, or urinal?: A Lot Help from another person bathing (including washing, rinsing, drying)?: A Lot Help from another person to put on and taking off regular upper body clothing?: A Little Help from another person to put on and taking off regular lower body clothing?: A Lot 6 Click Score: 16    End of Session Equipment Utilized During Treatment: Gait belt;Rolling walker  OT Visit Diagnosis: Unsteadiness on feet (R26.81);Other abnormalities of gait and mobility (R26.89);Pain Pain - Right/Left: Right Pain - part of body: Hip   Activity Tolerance Patient tolerated treatment well   Patient Left in chair;with call bell/phone within reach;with chair alarm set   Nurse Communication Mobility status;Patient requests pain meds        Time: [id number] OT Time Calculation (min): 21 min  Charges: OT General Charges $OT Visit: 1 Visit OT Treatments $Self Care/Home Management : 8-22 mins  Sherryl MangesLaura Tamyka Bezio OTR/L Acute Rehabilitation Services Pager: [phone] Office: [phone]    Evern BioLaura J Jamaree Hosier 08/23/2018, 4:15 PM

## 2018-08-23 NOTE — Plan of Care (Signed)
  Problem: Pain Managment: Goal: General experience of comfort will improve Outcome: Progressing   Problem: Safety: Goal: Ability to remain free from injury will improve Outcome: Progressing   

## 2018-08-23 NOTE — Progress Notes (Signed)
Physical Therapy Treatment Patient Details Name: Abigail [name] G Harms MRN: [id number] DOB: 07/29/1946 Today's Date: 08/23/2018    History of Present Illness Pt is a 72 y/o female s/p elective R THA, direct anterior. PMH includes HTN, CVA, vertigo and back surgery.     PT Comments    Patient seen for mobility progression. Pt lying in bed upon arrival and reports "I'm ready to try and get up". Pt continues to be limited by pain with mobility. Pt requires mod A for bed mobility, mod A +2 for sit to stand transfers, and mod-max A for short distance gait training. Pt received IV morphine during session and then limited by nausea and became diaphoretic. Continue to progress as tolerated.    Follow Up Recommendations  Follow surgeon's recommendation for DC plan and follow-up therapies;Supervision for mobility/OOB     Equipment Recommendations  3in1 (PT)    Recommendations for Other Services OT consult     Precautions / Restrictions Precautions Precautions: Fall Restrictions Weight Bearing Restrictions: Yes RLE Weight Bearing: Weight bearing as tolerated    Mobility  Bed Mobility Overal bed mobility: Needs Assistance Bed Mobility: Supine to Sit     Supine to sit: Mod assist;HOB elevated     General bed mobility comments: heavy use of rails and HOB elevated; assistance required to bring R LE and hip to EOB with bed pad; increased time for upright sitting due to L lateral lean away from painful R hip  Transfers Overall transfer level: Needs assistance Equipment used: Rolling walker (2 wheeled) Transfers: Sit to/from Stand Sit to Stand: Mod assist;+2 physical assistance         General transfer comment: assist to power up into standing with cues for hand placement and technique   Ambulation/Gait Ambulation/Gait assistance: Mod assist;+2 safety/equipment;Max assist Gait Distance (Feet): 8 Feet Assistive device: Rolling walker (2 wheeled) Gait Pattern/deviations: Step-to  pattern;Decreased stance time - right;Decreased step length - left;Decreased dorsiflexion - right;Decreased weight shift to right;Antalgic;Trunk flexed;Decreased step length - right Gait velocity: slow   General Gait Details: max multimodal cues for sequencing, R LE advancement, R quad activation during stance phase; assistance required for R LE swing phase and stabilization; pt is able to activation quad with cues   Stairs             Wheelchair Mobility    Modified Rankin (Stroke Patients Only)       Balance Overall balance assessment: Needs assistance Sitting-balance support: No upper extremity supported;Feet supported Sitting balance-Leahy Scale: Fair   Postural control: Left lateral lean(away from painful R hip) Standing balance support: Bilateral upper extremity supported;During functional activity Standing balance-Leahy Scale: Poor                              Cognition Arousal/Alertness: Awake/alert Behavior During Therapy: WFL for tasks assessed/performed Overall Cognitive Status: Within Functional Limits for tasks assessed                                        Exercises      General Comments General comments (skin integrity, edema, etc.): pt received IV morphine during session and gait distance limited by nausea, diaphoresis, and pt feeling flushed. Symptoms subside once in sitting       Pertinent Vitals/Pain Pain Assessment: Faces Faces Pain Scale: Hurts whole lot Pain Location: R hip with  movement  Pain Descriptors / Indicators: Grimacing;Guarding;Moaning;Sharp Pain Intervention(s): Limited activity within patient's tolerance;Monitored during session;Repositioned;Premedicated before session;Patient requesting pain meds-RN notified;RN gave pain meds during session    Home Living                      Prior Function            PT Goals (current goals can now be found in the care plan section) Acute Rehab PT  Goals Patient Stated Goal: to decrease pain  Progress towards PT goals: Progressing toward goals    Frequency    7X/week      PT Plan Current plan remains appropriate    Co-evaluation              AM-PAC PT "6 Clicks" Mobility   Outcome Measure  Help needed turning from your back to your side while in a flat bed without using bedrails?: A Lot Help needed moving from lying on your back to sitting on the side of a flat bed without using bedrails?: A Lot Help needed moving to and from a bed to a chair (including a wheelchair)?: A Lot Help needed standing up from a chair using your arms (e.g., wheelchair or bedside chair)?: A Lot Help needed to walk in hospital room?: A Lot Help needed climbing 3-5 steps with a railing? : Total 6 Click Score: 11    End of Session Equipment Utilized During Treatment: Gait belt Activity Tolerance: Patient limited by pain Patient left: with call bell/phone within reach;in chair Nurse Communication: Mobility status PT Visit Diagnosis: Other abnormalities of gait and mobility (R26.89);Muscle weakness (generalized) (M62.81);Difficulty in walking, not elsewhere classified (R26.2);Pain Pain - Right/Left: Right Pain - part of body: Hip     Time: 0827-0851 PT Time Calculation (min) (ACUTE ONLY): 24 min  Charges:  $Gait Training: 8-22 mins $Therapeutic Activity: 8-22 mins                     Erline Levine, PTA Acute Rehabilitation Services Pager: [phone] Office: [phone]     Carolynne Edouard 08/23/2018, 10:58 AM

## 2018-08-23 NOTE — Progress Notes (Signed)
Physical Therapy Treatment Patient Details Name: Abigail [name] MRN: [id number] DOB: 03/10/1946 Today's Date: 08/23/2018    History of Present Illness Pt is a 72 y/o female s/p elective R THA, direct anterior. PMH includes HTN, CVA, vertigo and back surgery.     PT Comments    Patient is making gradual progress toward PT goals and continues to be motivated to participate in therapy despite pain. Pt demonstrates improving gait mechanics and requires less assistance for R LE advancement this session. Pt does continue to be limited by pain and will need to be able to make more progress with gait, activity tolerance, and be able to complete stair training prior to d/c. Continue to progress as tolerated.    Follow Up Recommendations  Follow surgeon's recommendation for DC plan and follow-up therapies;Supervision for mobility/OOB     Equipment Recommendations  3in1 (PT)    Recommendations for Other Services OT consult     Precautions / Restrictions Precautions Precautions: Fall Restrictions Weight Bearing Restrictions: Yes RLE Weight Bearing: Weight bearing as tolerated    Mobility  Bed Mobility               General bed mobility comments: pt OOB in chair upon arrival  Transfers Overall transfer level: Needs assistance Equipment used: Rolling walker (2 wheeled) Transfers: Sit to/from Stand Sit to Stand: Min assist;+2 safety/equipment         General transfer comment: assist to power up into standing with cues for hand placement  Ambulation/Gait Ambulation/Gait assistance: +2 safety/equipment;Min assist Gait Distance (Feet): 12 Feet Assistive device: Rolling walker (2 wheeled) Gait Pattern/deviations: Step-to pattern;Decreased stance time - right;Decreased step length - left;Decreased dorsiflexion - right;Decreased weight shift to right;Antalgic;Trunk flexed;Decreased step length - right Gait velocity: slow   General Gait Details: mod verbal cues for sequencing,  posture, and step lengths; verbal and tactile cues for quad activation during stance phase; improved dorsiflexion and foot clearance noted   Stairs             Wheelchair Mobility    Modified Rankin (Stroke Patients Only)       Balance Overall balance assessment: Needs assistance Sitting-balance support: No upper extremity supported;Feet supported Sitting balance-Leahy Scale: Fair   Postural control: Left lateral lean(away from painful R hip) Standing balance support: Bilateral upper extremity supported;During functional activity Standing balance-Leahy Scale: Poor Standing balance comment: unable to maintain standing without BUE support                            Cognition Arousal/Alertness: Awake/alert Behavior During Therapy: WFL for tasks assessed/performed Overall Cognitive Status: Within Functional Limits for tasks assessed                                        Exercises Total Joint Exercises Ankle Circles/Pumps: AROM;Both Heel Slides: AAROM;Right;10 reps    General Comments        Pertinent Vitals/Pain Pain Assessment: Faces Pain Score: 10-Worst pain ever Faces Pain Scale: Hurts whole lot Pain Location: R hip with movement  Pain Descriptors / Indicators: Grimacing;Guarding;Moaning;Sharp Pain Intervention(s): Monitored during session;Repositioned    Home Living                      Prior Function            PT Goals (current goals can  now be found in the care plan section) Acute Rehab PT Goals Patient Stated Goal: to decrease pain  Progress towards PT goals: Progressing toward goals    Frequency    7X/week      PT Plan Current plan remains appropriate    Co-evaluation              AM-PAC PT "6 Clicks" Mobility   Outcome Measure  Help needed turning from your back to your side while in a flat bed without using bedrails?: A Lot Help needed moving from lying on your back to sitting on the side  of a flat bed without using bedrails?: A Lot Help needed moving to and from a bed to a chair (including a wheelchair)?: A Lot Help needed standing up from a chair using your arms (e.g., wheelchair or bedside chair)?: A Little Help needed to walk in hospital room?: A Little Help needed climbing 3-5 steps with a railing? : A Lot 6 Click Score: 14    End of Session Equipment Utilized During Treatment: Gait belt Activity Tolerance: Patient limited by pain Patient left: with call bell/phone within reach;in chair;Other (comment)(OT present end of session) Nurse Communication: Mobility status PT Visit Diagnosis: Other abnormalities of gait and mobility (R26.89);Muscle weakness (generalized) (M62.81);Difficulty in walking, not elsewhere classified (R26.2);Pain Pain - Right/Left: Right Pain - part of body: Hip     Time: 0865-7846 PT Time Calculation (min) (ACUTE ONLY): 27 min  Charges:  $Gait Training: 8-22 mins $Therapeutic Activity: 8-22 mins                     Erline Levine, PTA Acute Rehabilitation Services Pager: [phone] Office: [phone]     Carolynne Edouard 08/23/2018, 4:53 PM

## 2018-08-24 ENCOUNTER — Other Ambulatory Visit: Payer: Self-pay | Admitting: Family Medicine

## 2018-08-24 LAB — CBC
HCT: 27.5 % — ABNORMAL LOW (ref 36.0–46.0)
HEMOGLOBIN: 9 g/dL — AB (ref 12.0–15.0)
MCH: 29.4 pg (ref 26.0–34.0)
MCHC: 32.7 g/dL (ref 30.0–36.0)
MCV: 89.9 fL (ref 80.0–100.0)
Platelets: 113 10*3/uL — ABNORMAL LOW (ref 150–400)
RBC: 3.06 MIL/uL — ABNORMAL LOW (ref 3.87–5.11)
RDW: 12.8 % (ref 11.5–15.5)
WBC: 9 10*3/uL (ref 4.0–10.5)
nRBC: 0 % (ref 0.0–0.2)

## 2018-08-24 NOTE — Progress Notes (Signed)
Physical Therapy Treatment Patient Details Name: Abigail [name] Runions MRN: [id number] DOB: 01/15/1946 Today's Date: 08/24/2018    History of Present Illness Pt is a 72 y/o female s/p elective R THA, direct anterior. PMH includes HTN, CVA, vertigo and back surgery.     PT Comments    Patient seen for mobility progression. Pt continues to c/o R hip burning and pain with all mobility and weight bearing in sitting.  Pt premedicated. Pt tolerated gait training for 30 ft then 10 ft with seated rest break due to fatigue. Continue to progress as tolerated.   Follow Up Recommendations  Follow surgeon's recommendation for DC plan and follow-up therapies;Supervision for mobility/OOB     Equipment Recommendations  3in1 (PT)    Recommendations for Other Services OT consult     Precautions / Restrictions Precautions Precautions: Fall Restrictions Weight Bearing Restrictions: Yes RLE Weight Bearing: Weight bearing as tolerated    Mobility  Bed Mobility               General bed mobility comments: pt OOB in chair upon arrival  Transfers Overall transfer level: Needs assistance Equipment used: Rolling walker (2 wheeled) Transfers: Sit to/from Stand Sit to Stand: Min assist;+2 safety/equipment         General transfer comment: assist to power up into standing with cues for hand placement  Ambulation/Gait Ambulation/Gait assistance: +2 safety/equipment;Min assist Gait Distance (Feet): (30 ft then 6010ft with seated rest break) Assistive device: Rolling walker (2 wheeled) Gait Pattern/deviations: Decreased stance time - right;Decreased step length - left;Decreased dorsiflexion - right;Decreased weight shift to right;Antalgic;Decreased step length - right;Step-through pattern Gait velocity: slow   General Gait Details: verbal and tactile cues for R hip/knee flexion for swing phase; pt limited by fatigue and requires seated rest break    Stairs             Wheelchair Mobility     Modified Rankin (Stroke Patients Only)       Balance Overall balance assessment: Needs assistance Sitting-balance support: No upper extremity supported;Feet supported Sitting balance-Leahy Scale: Fair   Postural control: Left lateral lean(away from painful R hip) Standing balance support: Bilateral upper extremity supported;During functional activity Standing balance-Leahy Scale: Poor                              Cognition Arousal/Alertness: Awake/alert Behavior During Therapy: WFL for tasks assessed/performed Overall Cognitive Status: Within Functional Limits for tasks assessed                                        Exercises Total Joint Exercises Heel Slides: AAROM;Right;5 reps    General Comments        Pertinent Vitals/Pain Pain Assessment: Faces Faces Pain Scale: Hurts whole lot Pain Location: R hip with movement  Pain Descriptors / Indicators: Grimacing;Guarding;Moaning;Sharp;Burning Pain Intervention(s): Limited activity within patient's tolerance;Monitored during session;Premedicated before session;Repositioned    Home Living                      Prior Function            PT Goals (current goals can now be found in the care plan section) Acute Rehab PT Goals Patient Stated Goal: to decrease pain  Progress towards PT goals: Progressing toward goals    Frequency    7X/week  PT Plan Current plan remains appropriate    Co-evaluation              AM-PAC PT "6 Clicks" Mobility   Outcome Measure  Help needed turning from your back to your side while in a flat bed without using bedrails?: A Lot Help needed moving from lying on your back to sitting on the side of a flat bed without using bedrails?: A Lot Help needed moving to and from a bed to a chair (including a wheelchair)?: A Lot Help needed standing up from a chair using your arms (e.[name]., wheelchair or bedside chair)?: A Little Help needed to walk  in hospital room?: A Little Help needed climbing 3-5 steps with a railing? : A Lot 6 Click Score: 14    End of Session Equipment Utilized During Treatment: Gait belt Activity Tolerance: Patient limited by pain;Patient limited by fatigue Patient left: with call bell/phone within reach;in chair;with family/visitor present Nurse Communication: Mobility status PT Visit Diagnosis: Other abnormalities of gait and mobility (R26.89);Muscle weakness (generalized) (M62.81);Difficulty in walking, not elsewhere classified (R26.2);Pain Pain - Right/Left: Right Pain - part of body: Hip     Time: 1610-9604 PT Time Calculation (min) (ACUTE ONLY): 33 min  Charges:  $Gait Training: 23-37 mins                     Erline Levine, PTA Acute Rehabilitation Services Pager: [phone] Office: [phone]     Carolynne Edouard 08/24/2018, 2:25 PM

## 2018-08-24 NOTE — Plan of Care (Signed)
  Problem: Pain Managment: Goal: General experience of comfort will improve Outcome: Progressing   Problem: Safety: Goal: Ability to remain free from injury will improve Outcome: Progressing   

## 2018-08-24 NOTE — Plan of Care (Signed)
Problem: Education: Goal: Knowledge of General Education information will improve Description Including pain rating scale, medication(s)/side effects and non-pharmacologic comfort measures Outcome: Progressing   Problem: Health Behavior/Discharge Planning: Goal: Ability to manage health-related needs will improve Outcome: Progressing   Problem: Clinical Measurements: Goal: Respiratory complications will improve Outcome: Progressing   Problem: Activity: Goal: Risk for activity intolerance will decrease Outcome: Progressing   Problem: Nutrition: Goal: Adequate nutrition will be maintained Outcome: Progressing   Problem: Coping: Goal: Level of anxiety will decrease Outcome: Progressing   Problem: Pain Managment: Goal: General experience of comfort will improve Outcome: Progressing   Problem: Safety: Goal: Ability to remain free from injury will improve Outcome: Progressing   Problem: Skin Integrity: Goal: Risk for impaired skin integrity will decrease Outcome: Progressing   

## 2018-08-24 NOTE — Progress Notes (Signed)
Subjective: 3 Days Post-Op Procedure(s) (LRB): TOTAL HIP ARTHROPLASTY ANTERIOR APPROACH (Right)   Patient is feeling better today. She would still like to go home but knows that if she doesn't make progress today she may need SNF placement.  Activity level:  wbat Diet tolerance:  ok Voiding:  ok Patient reports pain as mild and moderate.    Objective: Vital signs in last 24 hours: Temp:  [99.2 F (37.3 C)-99.8 F (37.7 C)] 99.2 F (37.3 C) (12/06 0354) Pulse Rate:  [64-82] 64 (12/06 0354) Resp:  [18] 18 (12/06 0354) BP: (105-110)/(53) 105/53 (12/06 0354) SpO2:  [96 %-97 %] 96 % (12/06 0354)  Labs: Recent Labs    08/22/18 0214 08/23/18 0200 08/24/18 0141  HGB 9.7* 9.1* 9.0*   Recent Labs    08/23/18 0200 08/24/18 0141  WBC 8.5 9.0  RBC 3.10* 3.06*  HCT 27.8* 27.5*  PLT 106* 113*   Recent Labs    08/22/18 0214  NA 137  K 4.1  CL 105  CO2 24  BUN 13  CREATININE 0.91  GLUCOSE 149*  CALCIUM 8.8*   No results for input(s): LABPT, INR in the last 72 hours.  Physical Exam:  Neurologically intact ABD soft Neurovascular intact Sensation intact distally Intact pulses distally Dorsiflexion/Plantar flexion intact Incision: dressing C/D/I and no drainage No cellulitis present Compartment soft  Assessment/Plan:  3 Days Post-Op Procedure(s) (LRB): TOTAL HIP ARTHROPLASTY ANTERIOR APPROACH (Right) Advance diet Up with therapy Discharge home with home health if doing well and cleared by PT possibly this afternoon.  I will call patient mid day to see how morning therapy went. If she did well she can go home either today or tomorrow. If not we may need to consider SNF. Continue on ASA 325MG  BID x 4 weeks post op. Follow up in office 2 weeks post op. Keep bandage clean and dry until follow up.  Ramil Edgington, Ginger OrganNDREW PAUL 08/24/2018, 7:36 AM

## 2018-08-24 NOTE — Progress Notes (Signed)
Physical Therapy Treatment Patient Details Name: Abigail [name] MRN: [id number] DOB: 20-Jan-1946 Today's Date: 08/24/2018    History of Present Illness Pt is a 72 y/o female s/p elective R THA, direct anterior. PMH includes HTN, CVA, vertigo and back surgery.     PT Comments    Patient continues to make very gradual progress toward PT goals. Pt is able to ascend/descend 2 steps with increased time, cues, and min A. Pt unable to ambulate more than 70ft and ascend 2 steps this session due to pain and fatigue. Pt reports that she will need to ambulate much farther before getting to 3 steps at home. Continue to progress as tolerated.    Follow Up Recommendations  Follow surgeon's recommendation for DC plan and follow-up therapies;Supervision for mobility/OOB     Equipment Recommendations  3in1 (PT)    Recommendations for Other Services OT consult     Precautions / Restrictions Precautions Precautions: Fall Restrictions Weight Bearing Restrictions: Yes RLE Weight Bearing: Weight bearing as tolerated    Mobility  Bed Mobility               General bed mobility comments: pt OOB in chair upon arrival  Transfers Overall transfer level: Needs assistance Equipment used: Rolling walker (2 wheeled) Transfers: Sit to/from Stand Sit to Stand: Min assist         General transfer comment: assist to power up into standing   Ambulation/Gait Ambulation/Gait assistance: Min assist Gait Distance (Feet): 10 Feet Assistive device: Rolling walker (2 wheeled) Gait Pattern/deviations: Decreased stance time - right;Decreased step length - left;Decreased dorsiflexion - right;Decreased weight shift to right;Antalgic;Decreased step length - right;Step-through pattern Gait velocity: slow   General Gait Details: verbal and tactile cues for R hip/knee flexion for swing phase   Stairs Stairs: Yes Stairs assistance: Min assist Stair Management: One rail Right;Step to pattern;Sideways Number  of Stairs: 2 General stair comments: multimodal cues for sequencing   Wheelchair Mobility    Modified Rankin (Stroke Patients Only)       Balance Overall balance assessment: Needs assistance Sitting-balance support: No upper extremity supported;Feet supported Sitting balance-Leahy Scale: Fair   Postural control: Left lateral lean(away from painful R hip) Standing balance support: Bilateral upper extremity supported;During functional activity Standing balance-Leahy Scale: Poor                              Cognition Arousal/Alertness: Awake/alert Behavior During Therapy: WFL for tasks assessed/performed Overall Cognitive Status: Within Functional Limits for tasks assessed                                        Exercises Total Joint Exercises Heel Slides: AAROM;Right;5 reps    General Comments        Pertinent Vitals/Pain Pain Assessment: Faces Faces Pain Scale: Hurts even more Pain Location: R hip with movement  Pain Descriptors / Indicators: Grimacing;Guarding;Moaning;Sharp;Burning Pain Intervention(s): Limited activity within patient's tolerance;Monitored during session;Repositioned(RN reports pt declined pain medicine)    Home Living                      Prior Function            PT Goals (current goals can now be found in the care plan section) Acute Rehab PT Goals Patient Stated Goal: to decrease pain  Progress towards  PT goals: Progressing toward goals    Frequency    7X/week      PT Plan Current plan remains appropriate    Co-evaluation              AM-PAC PT "6 Clicks" Mobility   Outcome Measure  Help needed turning from your back to your side while in a flat bed without using bedrails?: A Lot Help needed moving from lying on your back to sitting on the side of a flat bed without using bedrails?: A Lot Help needed moving to and from a bed to a chair (including a wheelchair)?: A Lot Help needed  standing up from a chair using your arms (e.g., wheelchair or bedside chair)?: A Little Help needed to walk in hospital room?: A Little Help needed climbing 3-5 steps with a railing? : A Lot 6 Click Score: 14    End of Session Equipment Utilized During Treatment: Gait belt Activity Tolerance: Patient limited by pain;Patient limited by fatigue Patient left: with call bell/phone within reach;in chair(with OT end of session) Nurse Communication: Mobility status PT Visit Diagnosis: Other abnormalities of gait and mobility (R26.89);Muscle weakness (generalized) (M62.81);Difficulty in walking, not elsewhere classified (R26.2);Pain Pain - Right/Left: Right Pain - part of body: Hip     Time: [id number] PT Time Calculation (min) (ACUTE ONLY): 32 min  Charges:  $Gait Training: 23-37 mins                     Erline LevineKellyn Mykiah Schmuck, PTA Acute Rehabilitation Services Pager: [phone] Office: [phone]     Carolynne EdouardKellyn R Teea Ducey 08/24/2018, 5:33 PM

## 2018-08-24 NOTE — Progress Notes (Signed)
Occupational Therapy Treatment Patient Details Name: Abigail [name] MRN: [id number] DOB: 08-02-1946 Today's Date: 08/24/2018    History of present illness Pt is a 72 y/o female s/p elective R THA, direct anterior. PMH includes HTN, CVA, vertigo and back surgery.    OT comments  Pt making slow progress towards OT goals this session. Educated on use of 3 in 1 with demonstration in the orthopedic gym. Focus was on education for AE to address LB ADL. Education complete and "hip kit" provided to Pt (reacher, long handle shoe horn, sock donner, and long handle sponge). Pt also was assisted for transfer (min A) short ambulation (INCREASED time and effort - Pt moves VERY SLOW), and toilet transfer (min A) with set up for bath at sink afterwards. Pain control continues to be a limiting factor for Pt despite being very willing to work with therapy. OT will continue to follow acutely.    Follow Up Recommendations  Follow surgeon's recommendation for DC plan and follow-up therapies;Supervision/Assistance - 24 hour    Equipment Recommendations  3 in 1 bedside commode(in room already)    Recommendations for Other Services      Precautions / Restrictions Precautions Precautions: Fall Restrictions Weight Bearing Restrictions: Yes RLE Weight Bearing: Weight bearing as tolerated       Mobility Bed Mobility               General bed mobility comments: recieved in gym with PT and left in bathroom with NT  Transfers Overall transfer level: Needs assistance Equipment used: Rolling walker (2 wheeled) Transfers: Sit to/from Stand Sit to Stand: Min assist         General transfer comment: assist to power up into standing     Balance Overall balance assessment: Needs assistance Sitting-balance support: No upper extremity supported;Feet supported Sitting balance-Leahy Scale: Fair   Postural control: Left lateral lean(away from painful R hip) Standing balance support: Bilateral upper  extremity supported;During functional activity Standing balance-Leahy Scale: Poor                             ADL either performed or assessed with clinical judgement   ADL Overall ADL's : Needs assistance/impaired     Grooming: Min guard;Standing;Wash/dry hands;Wash/dry face Grooming Details (indicate cue type and reason): sink level Upper Body Bathing: Set up;Sitting   Lower Body Bathing: Moderate assistance;Sitting/lateral leans   Upper Body Dressing : Set up;Sitting       Toilet Transfer: Min guard;Minimal assistance;Ambulation;RW Toilet Transfer Details (indicate cue type and reason): min guard assist for balance and boost Toileting- Clothing Manipulation and Hygiene: Set up;Sitting/lateral lean       Functional mobility during ADLs: Minimal assistance;Min guard;Rolling walker;Cueing for sequencing General ADL Comments: pain very limiting and fatiging     Vision       Perception     Praxis      Cognition Arousal/Alertness: Awake/alert Behavior During Therapy: WFL for tasks assessed/performed Overall Cognitive Status: Within Functional Limits for tasks assessed                                          Exercises     Shoulder Instructions       General Comments      Pertinent Vitals/ Pain       Pain Assessment: Faces Faces Pain Scale: Hurts  whole lot Pain Location: R hip with movement  Pain Descriptors / Indicators: Grimacing;Guarding;Moaning;Sharp;Burning Pain Intervention(s): Monitored during session;Repositioned;Patient requesting pain meds-RN notified  Home Living                                          Prior Functioning/Environment              Frequency  Min 2X/week        Progress Toward Goals  OT Goals(current goals can now be found in the care plan section)  Progress towards OT goals: Progressing toward goals(slowly)  Acute Rehab OT Goals Patient Stated Goal: to decrease pain   OT Goal Formulation: With patient Time For Goal Achievement: 09/05/18 Potential to Achieve Goals: Good  Plan Discharge plan remains appropriate;Frequency remains appropriate    Co-evaluation                 AM-PAC OT "6 Clicks" Daily Activity     Outcome Measure   Help from another person eating meals?: None Help from another person taking care of personal grooming?: A Little Help from another person toileting, which includes using toliet, bedpan, or urinal?: A Lot Help from another person bathing (including washing, rinsing, drying)?: A Lot Help from another person to put on and taking off regular upper body clothing?: A Little Help from another person to put on and taking off regular lower body clothing?: A Lot 6 Click Score: 16    End of Session Equipment Utilized During Treatment: Gait belt;Rolling walker  OT Visit Diagnosis: Unsteadiness on feet (R26.81);Other abnormalities of gait and mobility (R26.89);Pain Pain - Right/Left: Right Pain - part of body: Hip   Activity Tolerance Patient tolerated treatment well   Patient Left Other (comment);with nursing/sitter in room(in bathroom, on 3 in 1 in front of sink with NT)   Nurse Communication Mobility status;Patient requests pain meds        Time: 8119-1478 OT Time Calculation (min): 42 min  Charges: OT General Charges $OT Visit: 1 Visit OT Treatments $Self Care/Home Management : 23-37 mins $Therapeutic Activity: 8-22 mins  Hulda Humphrey OTR/L Acute Rehabilitation Services Pager: [phone] Office: [place] 08/24/2018, 6:32 PM

## 2018-08-25 ENCOUNTER — Inpatient Hospital Stay (HOSPITAL_COMMUNITY): Payer: Medicare Other

## 2018-08-25 NOTE — Progress Notes (Signed)
Pt alert, able to make needs known. Continues to c/o pain in right hip. Medicated as needed. Pt is stable for discharge. No acute distress noted. Discharge teaching completed, pt and husband acknowledged receipt of teaching and stated they understood education. Pt assisted to vehicle via staff member.

## 2018-08-25 NOTE — Progress Notes (Signed)
Patient ID: Abigail [name] MRN: [id number] DOB/AGE: [birth year] 72 y.o.  Admit date: 08/21/2018 Discharge date: 08/25/2018  Admission Diagnoses:  Principal Problem:   Primary localized osteoarthritis of right hip Active Problems:   Primary osteoarthritis of right hip   Discharge Diagnoses:  Same  Past Medical History:  Diagnosis Date  . Bronchitis 09/2016  . Bruises easily   . DDD (degenerative disc disease)   . Degenerative joint disease of cervical spine 09-05-11   Cervival area, now some osteoarthritis-lower back and Rt. shoulder  . Depression   . Diverticulitis 09-05-11   hx. gastritis, diverticulitis x2 -now surgery planned  . DIVERTICULITIS, ACUTE 02/10/2010  . Dyspnea    with exertion  . Essential hypertension 09-05-11   tx. Verapamil  . GROIN PAIN 09/21/2010  . Headache(784.0)    headaches are better  . Hearing loss    right ear  . Heart palpitations   . Hemorrhoids   . History of bronchitis   . History of colonic polyps   . History of pneumonia   . History of tension headache   . Hyperlipidemia   . Hypertension   . Hypothyroidism 09-05-11   Supplement used  . Late effect of adverse effect of drug, medicinal or biological substance   . NECK PAIN, ACUTE 09/21/2010  . Neuromuscular disorder (HCC)    carpal tunnel in both hands  . Nocturia   . Osteoarthritis 112-17-12   spine and rt. hip, rt. shoulder  . Patent foramen ovale    Small - unable to be closed -   . Pneumonia   . PONV (postoperative nausea and vomiting)   . Sleep apnea 09-05-11   no cpap ever, had surgery to remove cartilage, no problems now  . SORE THROAT 04/30/2009  . Stroke (HCC) 09-05-11   2006/2009-(loss of memory, balance issues remains occ.)  . TIA 09/28/2007, 10/2014  . Unstable angina (HCC)    a. 05/2010 Cath: nl cors, EF 55%;  b. 05/2015 Lexiscan MV: small, severe, fixed apical defect and a small, severe, reversible inf lateral defect w/ apical thinning and mild ischemia, EF 54%.  Marland Kitchen URI  09/02/2008  . Vertigo     Surgeries: Procedure(s):right TOTAL HIP ARTHROPLASTY ANTERIOR APPROACH on 08/21/2018   Consultants:   Discharged Condition: Improved  Hospital Course: Abigail [name] is an 72 y.o. female who was admitted 08/21/2018 for operative treatment ofPrimary localized osteoarthritis of right hip. Patient has severe unremitting pain that affects sleep, daily activities, and work/hobbies. After pre-op clearance the patient was taken to the operating room on 08/21/2018 and underwent  Procedure(s):right TOTAL HIP ARTHROPLASTY ANTERIOR APPROACH.    Patient was given perioperative antibiotics:  Anti-infectives (From admission, onward)   Start     Dose/Rate Route Frequency Ordered Stop   08/22/18 0000  vancomycin (VANCOCIN) IVPB 1000 mg/200 mL premix     1,000 mg 200 mL/hr over 60 Minutes Intravenous Every 12 hours 08/21/18 1634 08/22/18 0124   08/21/18 1100  vancomycin (VANCOCIN) IVPB 1000 mg/200 mL premix     1,000 mg 200 mL/hr over 60 Minutes Intravenous To ShortStay Surgical 08/20/18 1341 08/21/18 1225       Patient was given sequential compression devices, early ambulation, and chemoprophylaxis to prevent DVT.she made slow progress with physical therapy.  She had limited options related pain medication due to intolerance and was only able to use p.o. Tramadol and IV pain medications while in the hospital.  Patient benefited maximally from hospital stay and there were no  complications.    Recent vital signs:  Patient Vitals for the past 24 hrs:  BP Temp Temp src Pulse Resp SpO2  08/25/18 0417 111/68 98.3 F (36.8 C) Oral 62 - 98 %  08/24/18 2041 113/60 99.7 F (37.6 C) Oral 76 - 96 %  08/24/18 1732 120/64 100 F (37.8 C) Oral 77 20 95 %  08/24/18 1348 (!) 108/57 - - 70 16 96 %     Recent laboratory studies:  Recent Labs    08/23/18 0200 08/24/18 0141  WBC 8.5 9.0  HGB 9.1* 9.0*  HCT 27.8* 27.5*  PLT 106* 113*     Discharge Medications:   Allergies as of  08/25/2018      Reactions   Dilaudid [hydromorphone Hcl] Shortness Of Breath, Nausea And Vomiting, Other (See Comments)   Able to take morphine without issue   Shellfish Allergy Shortness Of Breath, Rash   Only shrimp allergy   Aggrenox [aspirin-dipyridamole Er] Nausea And Vomiting, Other (See Comments)   Headache   Cephalexin Itching, Rash   Codeine Phosphate Nausea And Vomiting   Contrast Media [iodinated Diagnostic Agents] Nausea And Vomiting   Hydrocodone Nausea And Vomiting, Other (See Comments)   Can take morphine without issue   Meperidine Hcl Nausea And Vomiting   Percocet [oxycodone-acetaminophen] Nausea And Vomiting, Other (See Comments)   Can take morphine without issue      Medication List    TAKE these medications   ASPERCREME MAX ROLL-ON EX Apply 1 application topically daily as needed (back pain).   aspirin 325 MG EC tablet Take 1 tablet (325 mg total) by mouth 2 (two) times daily after a meal.   atorvastatin 40 MG tablet Commonly known as:  LIPITOR Take 1 tablet (40 mg total) by mouth daily. What changed:  when to take this   butalbital-acetaminophen-caffeine 50-325-40 MG tablet Commonly known as:  FIORICET, ESGIC TAKE 1 TABLET BY MOUTH EVERY 6 HOURS AS NEEDED FOR HEADACHE What changed:    how much to take  how to take this  when to take this  reasons to take this   clopidogrel 75 MG tablet Commonly known as:  PLAVIX TAKE 1 TABLET BY MOUTH ONCE A DAY What changed:  when to take this   levothyroxine 125 MCG tablet Commonly known as:  SYNTHROID, LEVOTHROID TAKE 1 TABLET BY MOUTH EVERY DAY BEFORE BREAKFAST What changed:  See the new instructions.   lisinopril 10 MG tablet Commonly known as:  PRINIVIL,ZESTRIL Take 1 tablet (10 mg total) by mouth daily.   tetrahydrozoline 0.05 % ophthalmic solution Place 1 drop into both eyes 4 (four) times daily as needed (for dry/irritated eyes.).   tiZANidine 4 MG tablet Commonly known as:  ZANAFLEX Take 1  tablet (4 mg total) by mouth every 6 (six) hours as needed.   traMADol 50 MG tablet Commonly known as:  ULTRAM Take 1-2 tablets (50-100 mg total) by mouth every 6 (six) hours as needed for moderate pain or severe pain. TAKE 1 TO 2 TABLETS BY MOUTH EVERY 4 TO 6 HOURS AS NEEDED FOR PAIN What changed:    how much to take  how to take this  when to take this  reasons to take this   verapamil 120 MG CR tablet Commonly known as:  CALAN-SR TAKE 1 TABLET (120 MG TOTAL) BY MOUTH AT BEDTIME.            Durable Medical Equipment  (From admission, onward)  Start     Ordered   08/21/18 1634  DME Walker rolling  Once    Question:  Patient needs a walker to treat with the following condition  Answer:  Primary osteoarthritis of right hip   08/21/18 1634   08/21/18 1634  DME 3 n 1  Once     08/21/18 1634   08/21/18 1634  DME Bedside commode  Once    Question:  Patient needs a bedside commode to treat with the following condition  Answer:  Primary osteoarthritis of right hip   08/21/18 1634           Discharge Care Instructions  (From admission, onward)         Start     Ordered   08/25/18 0000  Weight bearing as tolerated    Question Answer Comment  Laterality right   Extremity Lower      08/25/18 1213          Diagnostic Studies: Dg Pelvis Portable  Result Date: 08/25/2018 CLINICAL DATA:  Status post total right hip arthroplasty. EXAM: PORTABLE PELVIS 1-2 VIEWS COMPARISON:  08/21/2018 FINDINGS: Well seated components of a total right hip arthroplasty. No complicating features are demonstrated. The pubic symphysis and SI joints are intact. The left hip is normally located. Lumbosacral fusion hardware is noted. IMPRESSION: Right total hip arthroplasty components in good position without complicating features Electronically Signed   By: Rudie Meyer M.D.   On: 08/25/2018 11:46   Dg C-arm 1-60 Min  Result Date: 08/21/2018 CLINICAL DATA:  Status post anterior  approach right total hip arthroplasty. Reported fluoro time is 24 seconds. Reported dose is 1.35 mGy. EXAM: OPERATIVE right HIP (WITH PELVIS IF PERFORMED) 2 VIEWS TECHNIQUE: Fluoroscopic spot image(s) were submitted for interpretation post-operatively. COMPARISON:  None. FINDINGS: Two fluoroscopic images of the right hip reveal the placement of a prosthetic joint. Radiographic positioning is appropriate. The interface with the native bone appears normal. No acute native bone abnormality is observed. IMPRESSION: No immediate complication following right anterior approach hip joint replacement. Electronically Signed   By: David  [place] M.D.   On: 08/21/2018 15:11   Dg Hip Operative Unilat W Or W/o Pelvis Right  Result Date: 08/21/2018 CLINICAL DATA:  Status post anterior approach right total hip arthroplasty. Reported fluoro time is 24 seconds. Reported dose is 1.35 mGy. EXAM: OPERATIVE right HIP (WITH PELVIS IF PERFORMED) 2 VIEWS TECHNIQUE: Fluoroscopic spot image(s) were submitted for interpretation post-operatively. COMPARISON:  None. FINDINGS: Two fluoroscopic images of the right hip reveal the placement of a prosthetic joint. Radiographic positioning is appropriate. The interface with the native bone appears normal. No acute native bone abnormality is observed. IMPRESSION: No immediate complication following right anterior approach hip joint replacement. Electronically Signed   By: David  [place] M.D.   On: 08/21/2018 15:11    Disposition: Discharge disposition: 01-Home or Self Care       Discharge Instructions    Call MD / Call 911   Complete by:  As directed    If you experience chest pain or shortness of breath, CALL 911 and be transported to the hospital emergency room.  If you develope a fever above 101 F, pus (white drainage) or increased drainage or redness at the wound, or calf pain, call your surgeon's office.   Diet general   Complete by:  As directed    Increase activity slowly as  tolerated   Complete by:  As directed    Weight bearing as  tolerated   Complete by:  As directed    Laterality:  right   Extremity:  Lower      Follow-up Information    Marcene Corning, MD. Schedule an appointment as soon as possible for a visit in 2 weeks.   Specialty:  Orthopedic Surgery Contact information: [address] [place] [address] [phone]        [facility] Follow up.   Specialty:  Home Health Services Why:  A representative from [facility] will contact you to arrange start date and time for your therapy. Contact information: [address] [place] [address] [phone]            Signed: Matthew Folks 08/25/2018, 12:15 PM

## 2018-08-25 NOTE — Progress Notes (Signed)
Physical Therapy Treatment Patient Details Name: Abigail [name] MRN: [id number] DOB: [birth year] Today's Date: 08/25/2018    History of Present Illness Pt is a 72 y/o female s/p elective R THA, direct anterior. PMH includes HTN, CVA, vertigo and back surgery.     PT Comments    Pt remains slow and guarded due to R hip pain with movement.  Pt required max cues for encouragement to complete treatment this am.  Gait remains limited.  Will f/u this pm to review HEP and stair training before d/c home.  Pt remains to have support from her husband at d/c.    Follow Up Recommendations  Follow surgeon's recommendation for DC plan and follow-up therapies;Supervision for mobility/OOB     Equipment Recommendations  3in1 (PT)    Recommendations for Other Services OT consult     Precautions / Restrictions Precautions Precautions: Fall Restrictions Weight Bearing Restrictions: Yes RLE Weight Bearing: Weight bearing as tolerated    Mobility  Bed Mobility Overal bed mobility: Needs Assistance Bed Mobility: Supine to Sit Rolling: Min guard         General bed mobility comments: Pt required cues for LE progression to edge of bed with use of gait belt for assistance.  Pt is slow and guarded due to pain and required increased time to achieve sitting edge of bed.    Transfers Overall transfer level: Needs assistance Equipment used: Rolling walker (2 wheeled) Transfers: Sit to/from Stand Sit to Stand: Min guard         General transfer comment: Cues for hand placement and forward advancement of RLE during sit to stand.  Pt required cues for encouragement.  Slow to ascend with increased time to achieve full extension.  Ambulation/Gait Ambulation/Gait assistance: Min assist Gait Distance (Feet): 12 Feet(x2 trials) Assistive device: Rolling walker (2 wheeled) Gait Pattern/deviations: Decreased stance time - right;Decreased step length - left;Decreased dorsiflexion - right;Decreased weight  shift to right;Antalgic;Decreased step length - right;Step-through pattern Gait velocity: slow   General Gait Details: verbal and tactile cues for R hip/knee flexion for swing phase.  Initially for R LE advancement required physical assistance.     Stairs             Wheelchair Mobility    Modified Rankin (Stroke Patients Only)       Balance Overall balance assessment: Needs assistance   Sitting balance-Leahy Scale: Fair       Standing balance-Leahy Scale: Poor Standing balance comment: unable to maintain standing without BUE support                            Cognition Arousal/Alertness: Awake/alert Behavior During Therapy: WFL for tasks assessed/performed Overall Cognitive Status: Within Functional Limits for tasks assessed                                        Exercises Total Joint Exercises Ankle Circles/Pumps: AROM;Both;AAROM;Limitations;20 reps Ankle Circles/Pumps Limitations: Unable to achieve full dorsiflexion on R side required active assistance ROM on R Quad Sets: AROM;Right;10 reps;Supine Short Arc Quad: AROM;Right;10 reps;Supine Heel Slides: AAROM;10 reps;Right;Supine Hip ABduction/ADduction: AAROM;Right;10 reps;Supine Other Exercises Other Exercises: Calf stretch with gt belt: 3x30 secs to R LE.      General Comments        Pertinent Vitals/Pain Pain Assessment: 0-10 Pain Score: 8  Pain Location: R hip with  movement  Pain Descriptors / Indicators: Grimacing;Guarding;Moaning;Sharp;Burning Pain Intervention(s): Monitored during session;Repositioned;Ice applied    Home Living                      Prior Function            PT Goals (current goals can now be found in the care plan section) Acute Rehab PT Goals Patient Stated Goal: to decrease pain  Potential to Achieve Goals: Fair Progress towards PT goals: Progressing toward goals    Frequency    7X/week      PT Plan Current plan remains  appropriate    Co-evaluation              AM-PAC PT "6 Clicks" Mobility   Outcome Measure  Help needed turning from your back to your side while in a flat bed without using bedrails?: A Little Help needed moving from lying on your back to sitting on the side of a flat bed without using bedrails?: A Little Help needed moving to and from a bed to a chair (including a wheelchair)?: A Little Help needed standing up from a chair using your arms (e.g., wheelchair or bedside chair)?: A Little Help needed to walk in hospital room?: A Little Help needed climbing 3-5 steps with a railing? : A Little 6 Click Score: 18    End of Session Equipment Utilized During Treatment: Gait belt Activity Tolerance: Patient limited by pain;Patient limited by fatigue Patient left: in chair;with call bell/phone within reach Nurse Communication: Mobility status PT Visit Diagnosis: Other abnormalities of gait and mobility (R26.89);Muscle weakness (generalized) (M62.81);Difficulty in walking, not elsewhere classified (R26.2);Pain Pain - Right/Left: Right Pain - part of body: Hip     Time: [id number] PT Time Calculation (min) (ACUTE ONLY): 32 min  Charges:  $Gait Training: 8-22 mins $Therapeutic Exercise: 8-22 mins                     Joycelyn RuaAimee Vaughan Garfinkle, PTA Acute Rehabilitation Services Pager [phone] Office [phone]     Alyjah Lovingood Artis DelayJ Noami Bove 08/25/2018, 10:42 AM

## 2018-08-25 NOTE — Progress Notes (Signed)
Pt has no legal guardian

## 2018-08-25 NOTE — Progress Notes (Signed)
Subjective: 4 Days Post-Op Procedure(s) (LRB): TOTAL HIP ARTHROPLASTY ANTERIOR APPROACH (Right) Patient reports pain as moderate.  Physical therapy progress is being slowed down by pain.  She does not tolerate many pain medications and has been taking tramadol.  Objective: Vital signs in last 24 hours: Temp:  [98.3 F (36.8 C)-100 F (37.8 C)] 98.3 F (36.8 C) (12/07 0417) Pulse Rate:  [62-77] 62 (12/07 0417) Resp:  [16-20] 20 (12/06 1732) BP: (105-120)/(57-68) 111/68 (12/07 0417) SpO2:  [95 %-98 %] 98 % (12/07 0417)  Intake/Output from previous day: 12/06 0701 - 12/07 0700 In: 120 [P.O.:120] Out: -  Intake/Output this shift: No intake/output data recorded.  Recent Labs    08/23/18 0200 08/24/18 0141  HGB 9.1* 9.0*   Recent Labs    08/23/18 0200 08/24/18 0141  WBC 8.5 9.0  RBC 3.10* 3.06*  HCT 27.8* 27.5*  PLT 106* 113*   No results for input(s): NA, K, CL, CO2, BUN, CREATININE, GLUCOSE, CALCIUM in the last 72 hours. No results for input(s): LABPT, INR in the last 72 hours. Right hip exam: She has moderate right hip swelling.  She does have a little right ankle dorsiflexor weakness.  She reports that she has significant pain when she gets up to ambulate.right hip dressing is benign.   Assessment/Plan: 4 Days Post-Op Procedure(s) (LRB): TOTAL HIP ARTHROPLASTY ANTERIOR APPROACH (Right)  Plan: I will obtain a portable x-ray of the pelvis to make sure there were no periprosthetic complications status post right total hip replacement. Physical therapy this morning.  If does okay with PT and her x-ray looks okay we will plan on discharging her home early this afternoon.    Matthew FolksJames G Donnavan Covault 08/25/2018, 9:34 AM

## 2018-08-25 NOTE — Progress Notes (Addendum)
Physical Therapy Treatment Patient Details Name: Abigail [name] MRN: [id number] DOB: Aug 19, 1946 Today's Date: 08/25/2018    History of Present Illness Pt is a 72 y/o female s/p elective R THA, direct anterior. PMH includes HTN, CVA, vertigo and back surgery.     PT Comments    Pt remains limited due to pain but able to progress gait with increased time and max cues for encouragement.  Pt reviewed stair training and taught new technique to improve ease. Pt is ready for d/c home today.      Follow Up Recommendations  Follow surgeon's recommendation for DC plan and follow-up therapies;Supervision for mobility/OOB     Equipment Recommendations  3in1 (PT)    Recommendations for Other Services OT consult     Precautions / Restrictions Precautions Precautions: Fall Restrictions Weight Bearing Restrictions: Yes RLE Weight Bearing: Weight bearing as tolerated    Mobility  Bed Mobility Overal bed mobility: Needs Assistance Bed Mobility: Supine to Sit Rolling: Min guard         General bed mobility comments: Pt in recliner chair on arrival.    Transfers Overall transfer level: Needs assistance Equipment used: Rolling walker (2 wheeled) Transfers: Sit to/from Stand Sit to Stand: Min guard         General transfer comment: Cues for hand placement to and from seated surface.    Ambulation/Gait Ambulation/Gait assistance: Min assist Gait Distance (Feet): 60 Feet(+ 10 ft x2.  ) Assistive device: Rolling walker (2 wheeled) Gait Pattern/deviations: Decreased stance time - right;Decreased step length - left;Decreased dorsiflexion - right;Decreased weight shift to right;Antalgic;Decreased step length - right;Step-through pattern Gait velocity: slow   General Gait Details: verbal and tactile cues for R hip/knee flexion for swing phase.  Initially for R LE advancement required physical assistance.     Stairs Stairs: Yes Stairs assistance: Min assist Stair Management:  Backwards;With walker Number of Stairs: 2 General stair comments: Cues for sequencing and RW placement.  Attempted sideways negotiation but patient too painful to negotiate without B UE support.  Issued hand out for carryover at home with technique.  Husband assisted in stabilizing RW.     Wheelchair Mobility    Modified Rankin (Stroke Patients Only)       Balance Overall balance assessment: Needs assistance   Sitting balance-Leahy Scale: Fair       Standing balance-Leahy Scale: Poor Standing balance comment: unable to maintain standing without BUE support                            Cognition Arousal/Alertness: Awake/alert Behavior During Therapy: WFL for tasks assessed/performed Overall Cognitive Status: Within Functional Limits for tasks assessed                                        Exercises    General Comments        Pertinent Vitals/Pain Pain Assessment: 0-10 Pain Score: 7  Pain Location: R hip with movement  Pain Descriptors / Indicators: Grimacing;Guarding;Moaning;Sharp;Burning Pain Intervention(s): Monitored during session    Home Living                      Prior Function            PT Goals (current goals can now be found in the care plan section) Acute Rehab PT Goals Patient Stated  Goal: to decrease pain  Potential to Achieve Goals: Fair Progress towards PT goals: Progressing toward goals    Frequency    7X/week      PT Plan Current plan remains appropriate    Co-evaluation              AM-PAC PT "6 Clicks" Mobility   Outcome Measure  Help needed turning from your back to your side while in a flat bed without using bedrails?: A Little Help needed moving from lying on your back to sitting on the side of a flat bed without using bedrails?: A Little Help needed moving to and from a bed to a chair (including a wheelchair)?: A Little Help needed standing up from a chair using your arms (e.g.,  wheelchair or bedside chair)?: A Little Help needed to walk in hospital room?: A Little Help needed climbing 3-5 steps with a railing? : A Little 6 Click Score: 18    End of Session Equipment Utilized During Treatment: Gait belt Activity Tolerance: Patient limited by pain;Patient limited by fatigue Patient left: in chair;with call bell/phone within reach Nurse Communication: Mobility status PT Visit Diagnosis: Other abnormalities of gait and mobility (R26.89);Muscle weakness (generalized) (M62.81);Difficulty in walking, not elsewhere classified (R26.2);Pain Pain - Right/Left: Right Pain - part of body: Hip     Time: [id number] PT Time Calculation (min) (ACUTE ONLY): 39 min  Charges:  $Gait Training: 23-37 mins  $Therapeutic Activity: 8-22 mins                     Joycelyn RuaAimee Leshon Armistead, PTA Acute Rehabilitation Services Pager [phone] Office [phone]     Pearlene Teat Artis DelayJ Tymeshia Awan 08/25/2018, 1:46 PM

## 2018-08-27 NOTE — Care Management Important Message (Signed)
Important Message  Patient Details  Name: Abigail [name] MRN: [id number] Date of Birth: 06/13/1946   Medicare Important Message Given:  Yes    Dorena BodoIris Mariza Bourget 08/27/2018, 8:24 AM

## 2018-08-31 NOTE — Discharge Summary (Signed)
Marshia Ly, PA-C  Physician Assistant  Orthopedics  Progress Notes  Signed  Date of Service:  08/25/2018 12:15 PM          Signed      Expand All Collapse All    Show:Clear all [x] Manual[x] Template[] Copied  Added by: [x] Marshia Ly, PA-C  [] Hover for details Patient ID: Abigail [name] MRN: [id number] DOB/AGE: 1946-08-03 72 y.o.  Admit date: 08/21/2018 Discharge date: 08/25/2018  Admission Diagnoses:  Principal Problem:   Primary localized osteoarthritis of right hip Active Problems:   Primary osteoarthritis of right hip   Discharge Diagnoses:  Same      Past Medical History:  Diagnosis Date  . Bronchitis 09/2016  . Bruises easily   . DDD (degenerative disc disease)   . Degenerative joint disease of cervical spine 09-05-11   Cervival area, now some osteoarthritis-lower back and Rt. shoulder  . Depression   . Diverticulitis 09-05-11   hx. gastritis, diverticulitis x2 -now surgery planned  . DIVERTICULITIS, ACUTE 02/10/2010  . Dyspnea    with exertion  . Essential hypertension 09-05-11   tx. Verapamil  . GROIN PAIN 09/21/2010  . Headache(784.0)    headaches are better  . Hearing loss    right ear  . Heart palpitations   . Hemorrhoids   . History of bronchitis   . History of colonic polyps   . History of pneumonia   . History of tension headache   . Hyperlipidemia   . Hypertension   . Hypothyroidism 09-05-11   Supplement used  . Late effect of adverse effect of drug, medicinal or biological substance   . NECK PAIN, ACUTE 09/21/2010  . Neuromuscular disorder (HCC)    carpal tunnel in both hands  . Nocturia   . Osteoarthritis 112-17-12   spine and rt. hip, rt. shoulder  . Patent foramen ovale    Small - unable to be closed -   . Pneumonia   . PONV (postoperative nausea and vomiting)   . Sleep apnea 09-05-11   no cpap ever, had surgery to remove cartilage, no problems now  . SORE THROAT 04/30/2009  . Stroke  (HCC) 09-05-11   2006/2009-(loss of memory, balance issues remains occ.)  . TIA 09/28/2007, 10/2014  . Unstable angina (HCC)    a. 05/2010 Cath: nl cors, EF 55%;  b. 05/2015 Lexiscan MV: small, severe, fixed apical defect and a small, severe, reversible inf lateral defect w/ apical thinning and mild ischemia, EF 54%.  Marland Kitchen URI 09/02/2008  . Vertigo     Surgeries: Procedure(s):right TOTAL HIP ARTHROPLASTY ANTERIOR APPROACH on 08/21/2018   Consultants:   Discharged Condition: Improved  Hospital Course: Abigail [name] is an 72 y.o. female who was admitted 08/21/2018 for operative treatment ofPrimary localized osteoarthritis of right hip. Patient has severe unremitting pain that affects sleep, daily activities, and work/hobbies. After pre-op clearance the patient was taken to the operating room on 08/21/2018 and underwent  Procedure(s):right TOTAL HIP ARTHROPLASTY ANTERIOR APPROACH.    Patient was given perioperative antibiotics:             Anti-infectives (From admission, onward)   Start     Dose/Rate Route Frequency Ordered Stop   08/22/18 0000  vancomycin (VANCOCIN) IVPB 1000 mg/200 mL premix     1,000 mg 200 mL/hr over 60 Minutes Intravenous Every 12 hours 08/21/18 1634 08/22/18 0124   08/21/18 1100  vancomycin (VANCOCIN) IVPB 1000 mg/200 mL premix     1,000 mg 200 mL/hr over 60 Minutes  [facility] 08/20/18 1341 08/21/18 1225       Patient was given sequential compression devices, early ambulation, and chemoprophylaxis to prevent DVT.she made slow progress with physical therapy.  She had limited options related pain medication due to intolerance and was only able to use p.o. Tramadol and IV pain medications while in the hospital.  Patient benefited maximally from hospital stay and there were no complications.    Recent vital signs:  Patient Vitals for the past 24 hrs:  BP Temp Temp src Pulse Resp SpO2  08/25/18 0417 111/68 98.3 F (36.8 C) Oral 62  - 98 %  08/24/18 2041 113/60 99.7 F (37.6 C) Oral 76 - 96 %  08/24/18 1732 120/64 100 F (37.8 C) Oral 77 20 95 %  08/24/18 1348 (!) 108/57 - - 70 16 96 %     Recent laboratory studies:  RecentLabs(last2labs)  Recent Labs    08/23/18 0200 08/24/18 0141  WBC 8.5 9.0  HGB 9.1* 9.0*  HCT 27.8* 27.5*  PLT 106* 113*       Discharge Medications:        Allergies as of 08/25/2018      Reactions   Dilaudid [hydromorphone Hcl] Shortness Of Breath, Nausea And Vomiting, Other (See Comments)   Able to take morphine without issue   Shellfish Allergy Shortness Of Breath, Rash   Only shrimp allergy   Aggrenox [aspirin-dipyridamole Er] Nausea And Vomiting, Other (See Comments)   Headache   Cephalexin Itching, Rash   Codeine Phosphate Nausea And Vomiting   Contrast Media [iodinated Diagnostic Agents] Nausea And Vomiting   Hydrocodone Nausea And Vomiting, Other (See Comments)   Can take morphine without issue   Meperidine Hcl Nausea And Vomiting   Percocet [oxycodone-acetaminophen] Nausea And Vomiting, Other (See Comments)   Can take morphine without issue               Medication List        TAKE these medications       ASPERCREME MAX ROLL-ON EX Apply 1 application topically daily as needed (back pain).   aspirin 325 MG EC tablet Take 1 tablet (325 mg total) by mouth 2 (two) times daily after a meal.   atorvastatin 40 MG tablet Commonly known as:  LIPITOR Take 1 tablet (40 mg total) by mouth daily. What changed:  when to take this   butalbital-acetaminophen-caffeine 50-325-40 MG tablet Commonly known as:  FIORICET, ESGIC TAKE 1 TABLET BY MOUTH EVERY 6 HOURS AS NEEDED FOR HEADACHE What changed:    how much to take  how to take this  when to take this  reasons to take this   clopidogrel 75 MG tablet Commonly known as:  PLAVIX TAKE 1 TABLET BY MOUTH ONCE A DAY What changed:  when to take this   levothyroxine 125 MCG  tablet Commonly known as:  SYNTHROID, LEVOTHROID TAKE 1 TABLET BY MOUTH EVERY DAY BEFORE BREAKFAST What changed:  See the new instructions.   lisinopril 10 MG tablet Commonly known as:  PRINIVIL,ZESTRIL Take 1 tablet (10 mg total) by mouth daily.   tetrahydrozoline 0.05 % ophthalmic solution Place 1 drop into both eyes 4 (four) times daily as needed (for dry/irritated eyes.).   tiZANidine 4 MG tablet Commonly known as:  ZANAFLEX Take 1 tablet (4 mg total) by mouth every 6 (six) hours as needed.   traMADol 50 MG tablet Commonly known as:  ULTRAM Take 1-2 tablets (50-100 mg total) by mouth every  6 (six) hours as needed for moderate pain or severe pain. TAKE 1 TO 2 TABLETS BY MOUTH EVERY 4 TO 6 HOURS AS NEEDED FOR PAIN What changed:    how much to take  how to take this  when to take this  reasons to take this   verapamil 120 MG CR tablet Commonly known as:  CALAN-SR TAKE 1 TABLET (120 MG TOTAL) BY MOUTH AT BEDTIME.                        Durable Medical Equipment  (From admission, onward)                        Start     Ordered    08/21/18 1634  DME Walker rolling  Once    Question:  Patient needs a walker to treat with the following condition  Answer:  Primary osteoarthritis of right hip   08/21/18 1634    08/21/18 1634  DME 3 n 1  Once     08/21/18 1634    08/21/18 1634  DME Bedside commode  Once    Question:  Patient needs a bedside commode to treat with the following condition  Answer:  Primary osteoarthritis of right hip   08/21/18 1634                     Discharge Care Instructions  (From admission, onward)                        Start     Ordered    08/25/18 0000  Weight bearing as tolerated    Question Answer Comment  Laterality right   Extremity Lower      08/25/18 1213            Diagnostic Studies:  ImagingResults  Dg Pelvis Portable  Result Date: 08/25/2018 CLINICAL DATA:   Status post total right hip arthroplasty. EXAM: PORTABLE PELVIS 1-2 VIEWS COMPARISON:  08/21/2018 FINDINGS: Well seated components of a total right hip arthroplasty. No complicating features are demonstrated. The pubic symphysis and SI joints are intact. The left hip is normally located. Lumbosacral fusion hardware is noted. IMPRESSION: Right total hip arthroplasty components in good position without complicating features Electronically Signed   By: Rudie Meyer M.D.   On: 08/25/2018 11:46   Dg C-arm 1-60 Min  Result Date: 08/21/2018 CLINICAL DATA:  Status post anterior approach right total hip arthroplasty. Reported fluoro time is 24 seconds. Reported dose is 1.35 mGy. EXAM: OPERATIVE right HIP (WITH PELVIS IF PERFORMED) 2 VIEWS TECHNIQUE: Fluoroscopic spot image(s) were submitted for interpretation post-operatively. COMPARISON:  None. FINDINGS: Two fluoroscopic images of the right hip reveal the placement of a prosthetic joint. Radiographic positioning is appropriate. The interface with the native bone appears normal. No acute native bone abnormality is observed. IMPRESSION: No immediate complication following right anterior approach hip joint replacement. Electronically Signed   By: David  [place] M.D.   On: 08/21/2018 15:11   Dg Hip Operative Unilat W Or W/o Pelvis Right  Result Date: 08/21/2018 CLINICAL DATA:  Status post anterior approach right total hip arthroplasty. Reported fluoro time is 24 seconds. Reported dose is 1.35 mGy. EXAM: OPERATIVE right HIP (WITH PELVIS IF PERFORMED) 2 VIEWS TECHNIQUE: Fluoroscopic spot image(s) were submitted for interpretation post-operatively. COMPARISON:  None. FINDINGS: Two fluoroscopic images of the right hip reveal the placement of a prosthetic  joint. Radiographic positioning is appropriate. The interface with the native bone appears normal. No acute native bone abnormality is observed. IMPRESSION: No immediate complication following right anterior approach  hip joint replacement. Electronically Signed   By: David  [place] M.D.   On: 08/21/2018 15:11     Disposition: Discharge disposition: 01-Home or Self Care           Discharge Instructions    Call MD / Call 911   Complete by:  As directed    If you experience chest pain or shortness of breath, CALL 911 and be transported to the hospital emergency room.  If you develope a fever above 101 F, pus (white drainage) or increased drainage or redness at the wound, or calf pain, call your surgeon's office.   Diet general   Complete by:  As directed    Increase activity slowly as tolerated   Complete by:  As directed    Weight bearing as tolerated   Complete by:  As directed    Laterality:  right   Extremity:  Lower         Follow-up Information    Marcene Corning, MD. Schedule an appointment as soon as possible for a visit in 2 weeks.   Specialty:  Orthopedic Surgery Contact information: [address] [place] [address] [phone]        [facility] Follow up.   Specialty:  Home Health Services Why:  A representative from [facility] will contact you to arrange start date and time for your therapy. Contact information: [address] [place] [address] [phone]            Signed: Matthew Folks 08/25/2018, 12:15 PM

## 2018-09-05 ENCOUNTER — Emergency Department (HOSPITAL_COMMUNITY)
Admission: EM | Admit: 2018-09-05 | Discharge: 2018-09-05 | Disposition: A | Discharge status: Home / Self Care | Payer: Medicare Other | Attending: Emergency Medicine | Admitting: Emergency Medicine

## 2018-09-05 ENCOUNTER — Other Ambulatory Visit (HOSPITAL_COMMUNITY): Payer: Self-pay | Admitting: Orthopaedic Surgery

## 2018-09-05 ENCOUNTER — Ambulatory Visit (HOSPITAL_BASED_OUTPATIENT_CLINIC_OR_DEPARTMENT_OTHER)
Admission: RE | Admit: 2018-09-05 | Discharge: 2018-09-05 | Disposition: A | Discharge status: Home / Self Care | Payer: Medicare Other | Source: Ambulatory Visit | Attending: Orthopaedic Surgery | Admitting: Orthopaedic Surgery

## 2018-09-05 ENCOUNTER — Other Ambulatory Visit: Payer: Self-pay

## 2018-09-05 ENCOUNTER — Encounter (HOSPITAL_COMMUNITY): Payer: Self-pay | Admitting: Pharmacy Technician

## 2018-09-05 ENCOUNTER — Telehealth: Payer: Self-pay | Admitting: Cardiology

## 2018-09-05 DIAGNOSIS — Z79899 Other long term (current) drug therapy: Secondary | ICD-10-CM | POA: Insufficient documentation

## 2018-09-05 DIAGNOSIS — Z87891 Personal history of nicotine dependence: Secondary | ICD-10-CM | POA: Diagnosis not present

## 2018-09-05 DIAGNOSIS — M79604 Pain in right leg: Secondary | ICD-10-CM | POA: Insufficient documentation

## 2018-09-05 DIAGNOSIS — M7989 Other specified soft tissue disorders: Secondary | ICD-10-CM | POA: Insufficient documentation

## 2018-09-05 DIAGNOSIS — E039 Hypothyroidism, unspecified: Secondary | ICD-10-CM | POA: Diagnosis not present

## 2018-09-05 DIAGNOSIS — I82411 Acute embolism and thrombosis of right femoral vein: Secondary | ICD-10-CM | POA: Insufficient documentation

## 2018-09-05 DIAGNOSIS — I10 Essential (primary) hypertension: Secondary | ICD-10-CM | POA: Insufficient documentation

## 2018-09-05 MED ORDER — RIVAROXABAN 15 MG PO TABS
15.0000 mg | ORAL_TABLET | Freq: Once | ORAL | Status: AC
  Administered 2018-09-05: 15 mg via ORAL
  Filled 2018-09-05 (×2): qty 1

## 2018-09-05 MED ORDER — RIVAROXABAN (XARELTO) VTE STARTER PACK (15 & 20 MG): ORAL_TABLET | ORAL | 0 refills | Status: DC

## 2018-09-05 NOTE — ED Notes (Signed)
Patient verbalizes understanding of discharge instructions. Opportunity for questioning and answers were provided. 

## 2018-09-05 NOTE — Progress Notes (Signed)
Right lower extremity venous duplex exam completed. Positive Findings consistent with acute deep vein thrombosis involving the right femoral vein distal segment above the knee with partially re-cannalized , right posterior tibial vein, and right peroneal vein.  Result called ordering physician's office spoke with Jesse/Morgan that they will contact patient to discuss either go to ED or pick up medication from pharmacy. Renata Gambino H Terri Rorrer(RDMS RVT) 09/05/18 4:40 PM

## 2018-09-05 NOTE — ED Triage Notes (Signed)
Pt arrives POV with + DVT RLE. Pt with recent hip replacement. On plavix and asa. Pt denies CP/SOB.

## 2018-09-05 NOTE — Telephone Encounter (Signed)
Received a call from Ardelia MemsAndrew Knight PA.DOD Dr.Kelly took call.

## 2018-09-05 NOTE — ED Provider Notes (Signed)
[facility] EMERGENCY DEPARTMENT Provider Note   CSN: [id number] Arrival date & time: 09/05/18  1722     History   Chief Complaint No chief complaint on file.   HPI Abigail [name] is a 72 y.o. female.  HPI Patient presents with DVT in her right leg.  Recent hip replacement.  Swelling the leg and had outpatient Doppler ordered by Ortho.  Has pain in the leg.  States that pain is not that different than it has been the whole time since surgery.  States she cannot take opiates so the pain is more severe.  No difficulty breathing.  No chest pain.  No lightheadedness or dizziness.  She has had previous strokes and is on aspirin and Plavix.  Did not tolerate Aggrenox. Past Medical History:  Diagnosis Date  . Bronchitis 09/2016  . Bruises easily   . DDD (degenerative disc disease)   . Degenerative joint disease of cervical spine 09-05-11   Cervival area, now some osteoarthritis-lower back and Rt. shoulder  . Depression   . Diverticulitis 09-05-11   hx. gastritis, diverticulitis x2 -now surgery planned  . DIVERTICULITIS, ACUTE 02/10/2010  . Dyspnea    with exertion  . Essential hypertension 09-05-11   tx. Verapamil  . GROIN PAIN 09/21/2010  . Headache(784.0)    headaches are better  . Hearing loss    right ear  . Heart palpitations   . Hemorrhoids   . History of bronchitis   . History of colonic polyps   . History of pneumonia   . History of tension headache   . Hyperlipidemia   . Hypertension   . Hypothyroidism 09-05-11   Supplement used  . Late effect of adverse effect of drug, medicinal or biological substance   . NECK PAIN, ACUTE 09/21/2010  . Neuromuscular disorder (HCC)    carpal tunnel in both hands  . Nocturia   . Osteoarthritis 112-17-12   spine and rt. hip, rt. shoulder  . Patent foramen ovale    Small - unable to be closed -   . Pneumonia   . PONV (postoperative nausea and vomiting)   . Sleep apnea 09-05-11   no cpap ever, had surgery to  remove cartilage, no problems now  . SORE THROAT 04/30/2009  . Stroke (HCC) 09-05-11   2006/2009-(loss of memory, balance issues remains occ.)  . TIA 09/28/2007, 10/2014  . Unstable angina (HCC)    a. 05/2010 Cath: nl cors, EF 55%;  b. 05/2015 Lexiscan MV: small, severe, fixed apical defect and a small, severe, reversible inf lateral defect w/ apical thinning and mild ischemia, EF 54%.  Marland Kitchen URI 09/02/2008  . Vertigo     Patient Active Problem List   Diagnosis Date Noted  . Primary localized osteoarthritis of right hip 08/21/2018  . Primary osteoarthritis of right hip 08/21/2018  . HTN (hypertension) 05/15/2018  . PFO (patent foramen ovale)   . Acute CVA (cerebrovascular accident) (HCC) 02/23/2017  . Right facial numbness 02/23/2017  . Chronic back pain 02/23/2017  . History of partial thyroidectomy 02/23/2017  . Spondylolisthesis of lumbar region 05/03/2016  . Dizziness and giddiness 06/04/2015  . Right-sided lacunar infarction (HCC) 06/04/2015  . Lacunar ataxic hemiparesis (HCC) 06/03/2015  . Unstable angina (HCC)   . Chest pain 05/07/2015  . Tick bite of right forearm 09/04/2014  . Ventral incisional hernia 12/13/2013  . Benign microscopic hematuria 03/05/2012  . Diverticulitis of sigmoid colon 05/04/2011  . PRECORDIAL PAIN 06/08/2010  . PALPITATIONS 05/27/2010  .  CERVICAL LYMPHADENOPATHY, RIGHT 05/15/2008  . Headache 02/28/2008  . TENSION HEADACHE 02/12/2008  . Sebaceous cyst 12/11/2007  . PROBLEMS WITH SWALLOWING AND MASTICATION 11/20/2007  . COLONIC POLYPS, HX OF 07/23/2007  . Hypothyroidism 03/06/2007  . Hyperlipidemia 03/06/2007  . Essential hypertension 03/06/2007  . History of cardiovascular disorder 03/06/2007    Past Surgical History:  Procedure Laterality Date  . ABDOMINAL HYSTERECTOMY    . BACK SURGERY    . CARDIAC CATHETERIZATION N/A 06/01/2015   Procedure: Left Heart Cath and Coronary Angiography;  Surgeon: Kathleene Hazel, MD;  Location: [facility] INVASIVE  CV LAB;  Service: Cardiovascular;  Laterality: N/A;  . COLON RESECTION  09/07/2011   Procedure: LAPAROSCOPIC SIGMOID COLON RESECTION;  Surgeon: Mariella Saa, MD;  Location: [facility] ORS;  Service: General;  Laterality: N/A;  with proctoscopy  . COLONOSCOPY  08/06/2015   per Dr. Kinnie Scales, adenomatous polyps, repeat in 5 yrs   . CYST EXCISION N/A 11/29/2016   Procedure: EXCISION UMBILICAL CYST;  Surgeon: Glenna Fellows, MD;  Location: [facility]S;  Service: General;  Laterality: N/A;  . ELBOW SURGERY  09-05-11   left elbow -ligament repair  . EYE SURGERY     cataract  . INSERTION OF MESH N/A 12/13/2013   Procedure: INSERTION OF MESH;  Surgeon: Mariella Saa, MD;  Location: [facility]S;  Service: General;  Laterality: N/A;  . LAPAROSCOPY  09/07/2011   Procedure: LAPAROSCOPY DIAGNOSTIC;  Surgeon: Roselle Locus II;  Location: [facility];  Service: Gynecology;  Laterality: N/A;  . MAXIMUM ACCESS (MAS)POSTERIOR LUMBAR INTERBODY FUSION (PLIF) 2 LEVEL N/A 05/03/2016   Procedure: L4-5 L5-S1 Maximum access posterior lumbar interbody fusion;  Surgeon: Maeola Harman, MD;  Location: [facility] NEURO ORS;  Service: Neurosurgery;  Laterality: N/A;  L4-5 L5-S1 Maximum access posterior lumbar interbody fusion  . SALPINGOOPHORECTOMY  09/07/2011   Procedure: SALPINGO OOPHERECTOMY;  Surgeon: Roselle Locus II;  Location: [facility];  Service: Gynecology;  Laterality: Right;  . THYROIDECTOMY, PARTIAL  09-05-11  . TOTAL HIP ARTHROPLASTY Right 08/21/2018   Procedure: TOTAL HIP ARTHROPLASTY ANTERIOR APPROACH;  Surgeon: Marcene Corning, MD;  Location: [facility] OR;  Service: Orthopedics;  Laterality: Right;  . TUBAL LIGATION  1983  . UMBILICAL HERNIA REPAIR  yrs ago  . VENTRAL HERNIA REPAIR  06/20/2012   Procedure: LAPAROSCOPIC VENTRAL HERNIA;  Surgeon: Mariella Saa, MD;  Location: [facility];  Service: General;  Laterality: N/A;  Laparoscopic Repair of Ventral Hernia with mesh  . VENTRAL HERNIA REPAIR N/A 12/13/2013   Procedure:  LAPAROSCOPIC REPAIR RECURRENT VENTRAL INCISIONAL  HERNIA;  Surgeon: Mariella Saa, MD;  Location: [facility];  Service: General;  Laterality: N/A;     OB History   No obstetric history on file.      Home Medications    Prior to Admission medications   Medication Sig Start Date End Date Taking? Authorizing Provider  atorvastatin (LIPITOR) 40 MG tablet Take 1 tablet (40 mg total) by mouth daily. Patient taking differently: Take 40 mg by mouth every evening.  08/02/18   Nelwyn Salisbury, MD  butalbital-acetaminophen-caffeine (FIORICET, ESGIC) 50-325-40 MG tablet TAKE 1 TABLET BY MOUTH EVERY 6 HOURS AS NEEDED FOR HEADACHE Patient taking differently: Take 1 tablet by mouth every 6 (six) hours as needed for headache. TAKE 1 TABLET BY MOUTH EVERY 6 HOURS AS NEEDED FOR HEADACHE 05/15/18   Nelwyn Salisbury, MD  levothyroxine (SYNTHROID, LEVOTHROID) 125 MCG tablet TAKE 1 TABLET BY MOUTH EVERY DAY BEFORE BREAKFAST Patient taking  differently: Take 125 mcg by mouth daily before breakfast. TAKE 1 TABLET BY MOUTH EVERY DAY BEFORE BREAKFAST 06/19/18   Roderick Pee, MD  lisinopril (PRINIVIL,ZESTRIL) 10 MG tablet Take 1 tablet (10 mg total) by mouth daily. 08/13/18   Nelwyn Salisbury, MD  Menthol, Topical Analgesic, (ASPERCREME MAX ROLL-ON EX) Apply 1 application topically daily as needed (back pain).    [provider]  Rivaroxaban 15 & 20 MG TBPK Take as directed on package: Start with one 15mg  tablet by mouth twice a day with food. On Day 22, switch to one 20mg  tablet once a day with food. 09/05/18   Benjiman Core, MD  tetrahydrozoline 0.05 % ophthalmic solution Place 1 drop into both eyes 4 (four) times daily as needed (for dry/irritated eyes.).    [provider]  tiZANidine (ZANAFLEX) 4 MG tablet Take 1 tablet (4 mg total) by mouth every 6 (six) hours as needed. 08/22/18 08/22/19  Elodia Florence, PA-C  traMADol (ULTRAM) 50 MG tablet Take 1-2 tablets (50-100 mg total) by mouth every 6 (six)  hours as needed for moderate pain or severe pain. TAKE 1 TO 2 TABLETS BY MOUTH EVERY 4 TO 6 HOURS AS NEEDED FOR PAIN 08/22/18   Elodia Florence, PA-C  verapamil (CALAN-SR) 120 MG CR tablet TAKE 1 TABLET (120 MG TOTAL) BY MOUTH AT BEDTIME. 08/24/18   Nelwyn Salisbury, MD    Family History Family History  Problem Relation Age of Onset  . Arrhythmia Mother   . Hypertension Mother   . Stroke Mother   . Stroke Maternal Grandmother   . Diabetes Paternal Grandmother     Social History Social History   Tobacco Use  . Smoking status: Former Smoker    Packs/day: 0.50    Years: 5.00    Pack years: 2.50    Types: Cigarettes    Last attempt to quit: 09/19/1968    Years since quitting: 49.9  . Smokeless tobacco: Never Used  Substance Use Topics  . Alcohol use: Yes    Alcohol/week: 2.0 standard drinks    Types: 1 Cans of beer, 1 Shots of liquor per week    Comment: rarely  . Drug use: No     Allergies   Dilaudid [hydromorphone hcl]; Shellfish allergy; Aggrenox [aspirin-dipyridamole er]; Cephalexin; Codeine phosphate; Contrast media [iodinated diagnostic agents]; Hydrocodone; Meperidine hcl; and Percocet [oxycodone-acetaminophen]   Review of Systems Review of Systems  Constitutional: Negative for appetite change and fever.  Respiratory: Negative for cough and shortness of breath.   Cardiovascular: Positive for leg swelling. Negative for chest pain.  Gastrointestinal: Negative for abdominal pain.  Genitourinary: Negative for flank pain.  Musculoskeletal: Negative for back pain.       Pain and swelling in right lower extremity.  Skin: Negative for rash.  Neurological: Negative for weakness.  Psychiatric/Behavioral: Negative for confusion.     Physical Exam Updated Vital Signs BP (!) 152/82   Pulse 60   Ht 5\' 5"  (1.651 m)   Wt 81.8 kg   LMP  (LMP Unknown)   SpO2 98%   BMI 30.01 kg/m   Physical Exam HENT:     Head: Atraumatic.     Nose: Nose normal.     Mouth/Throat:      Mouth: Mucous membranes are moist.  Eyes:     Extraocular Movements: Extraocular movements intact.  Neck:     Musculoskeletal: Neck supple.  Cardiovascular:     Rate and Rhythm: Normal rate and regular rhythm.  Pulmonary:     Breath sounds: No wheezing or rhonchi.  Abdominal:     Tenderness: There is no abdominal tenderness.  Musculoskeletal:     Right lower leg: Edema present.     Comments: Edema right thigh and right lower leg.  No skin changes.  Pulse intact.  Skin:    Capillary Refill: Capillary refill takes less than 2 seconds.  Neurological:     General: No focal deficit present.     Mental Status: She is alert.  Psychiatric:        Mood and Affect: Mood normal.      ED Treatments / Results  Labs (all labs ordered are listed, but only abnormal results are displayed) Labs Reviewed - No data to display  EKG None  Radiology Vas [place] Lower Extremity Venous (dvt)  Result Date: 09/05/2018  Lower Venous Study Indications: Pain, Swelling, and Post-op. Other Indications: Post total hip arthroplasty on 08/21/2018. Limitations: Body habitus, bandages, musculoskeletal features and severe pain. Comparison Study: No prior study available Performing Technologist: Melodie Bouillon  Examination Guidelines: A complete evaluation includes B-mode imaging, spectral Doppler, color Doppler, and power Doppler as needed of all accessible portions of each vessel. Bilateral testing is considered an integral part of a complete examination. Limited examinations for reoccurring indications may be performed as noted.  Right Venous Findings: +---------+---------------+---------+-----------+----------------------+-------+          CompressibilityPhasicitySpontaneityProperties            Summary +---------+---------------+---------+-----------+----------------------+-------+ CFV      Full           Yes      Yes                                       +---------+---------------+---------+-----------+----------------------+-------+ SFJ      Full                                                             +---------+---------------+---------+-----------+----------------------+-------+ FV Prox  Full                                                             +---------+---------------+---------+-----------+----------------------+-------+ FV Mid   Full                                                             +---------+---------------+---------+-----------+----------------------+-------+ FV DistalPartial                            partially             Acute  re-cannalized                 +---------+---------------+---------+-----------+----------------------+-------+ PFV      Full                                                             +---------+---------------+---------+-----------+----------------------+-------+ POP      Full           Yes      Yes                                      +---------+---------------+---------+-----------+----------------------+-------+ PTV      None                                                     Acute   +---------+---------------+---------+-----------+----------------------+-------+ PERO     None                                                     Acute   +---------+---------------+---------+-----------+----------------------+-------+  Left Venous Findings: +---+---------------+---------+-----------+----------+-------+    CompressibilityPhasicitySpontaneityPropertiesSummary +---+---------------+---------+-----------+----------+-------+ CFVFull           Yes      Yes                          +---+---------------+---------+-----------+----------+-------+    Summary: Right: Findings consistent with acute deep vein thrombosis involving the right femoral vein distal segment above the knee with partially  re-cannalized , right posterior tibial vein, and right peroneal vein. No cystic structure found in the popliteal fossa. Left: No evidence of common femoral vein obstruction.  *See table(s) above for measurements and observations.    Preliminary     Procedures Procedures (including critical care time)  Medications Ordered in ED Medications  Rivaroxaban (XARELTO) tablet 15 mg (has no administration in time range)     Initial Impression / Assessment and Plan / ED Course  I have reviewed the triage vital signs and the nursing notes.  Pertinent labs & imaging results that were available during my care of the patient were reviewed by me and considered in my medical decision making (see chart for details).     Patient with a DVT of right lower extremity.  Has had previous strokes but they appear to be more microvascular although there was question of PFO.  Doubt pulmonary embolism.  Discussed with Dr.Kale from hematology and does not appear to need hypercoagulable work-up at this time.  Discussed with Dr. Laurence Slate from neurology and will stop aspirin and Plavix and start Xarelto.  Will need to follow-up with PCP cardiology neurology and Ortho.  Will discharge home.  Final Clinical Impressions(s) / ED Diagnoses   Final diagnoses:  Acute deep vein thrombosis (DVT) of femoral vein of right lower extremity [facility])    ED Discharge Orders         Ordered    Rivaroxaban 15 &  20 MG TBPK     09/05/18 1840           Benjiman CorePickering, Keyshawna Prouse, MD 09/05/18 [phone]

## 2018-09-06 ENCOUNTER — Telehealth: Payer: Self-pay | Admitting: Family Medicine

## 2018-09-06 NOTE — Telephone Encounter (Signed)
Copied from CRM [phone]. Topic: Quick Communication - See Telephone Encounter >> Sep 06, 2018  2:30 PM Jens SomMedley, Jennifer A wrote: CRM for notification. See Telephone encounter for: 09/06/18.  The patient was in the hospital and was placed on xarelto 15mg  2x a day after 21 day will go to xarelto 20mg  1x a day this replaced the Plavix.  Wanted to make Dr. Clent RidgesFry aware. So that when she needs a refill.  He can refill it for her.

## 2018-09-07 NOTE — Telephone Encounter (Signed)
FYI for Dr. Clent RidgesFry that the pt is now on xarelto.

## 2018-09-10 ENCOUNTER — Encounter: Payer: Self-pay | Admitting: Family Medicine

## 2018-09-10 ENCOUNTER — Ambulatory Visit: Payer: Medicare Other | Admitting: Family Medicine

## 2018-09-10 VITALS — BP 120/82 | HR 65 | Temp 98.8°F | Wt 176.2 lb

## 2018-09-10 DIAGNOSIS — Z96641 Presence of right artificial hip joint: Secondary | ICD-10-CM

## 2018-09-10 DIAGNOSIS — I82411 Acute embolism and thrombosis of right femoral vein: Secondary | ICD-10-CM | POA: Diagnosis not present

## 2018-09-10 DIAGNOSIS — I10 Essential (primary) hypertension: Secondary | ICD-10-CM | POA: Diagnosis not present

## 2018-09-10 MED ORDER — RIVAROXABAN 20 MG PO TABS: 20.0000 mg | ORAL_TABLET | Freq: Every day | ORAL | 5 refills | Status: DC

## 2018-09-10 MED ORDER — LOSARTAN POTASSIUM 25 MG PO TABS: 25.0000 mg | ORAL_TABLET | Freq: Every day | ORAL | 5 refills | Status: DC

## 2018-09-10 NOTE — Progress Notes (Signed)
   Subjective:    Patient ID: Abigail [name], female    DOB: 02/06/1946, 72 y.o.   MRN: [id number]  HPI Here to follow up an ER visit on 09-05-18 for pain and swelling in the right leg. She was proven to have a DVT in the right distal femoral, posterior tibial, and peroneal veins. She had been taking Plavix and aspirin for a hx of strokes, but these were stopped and she was started on Xarelto. The ER provider checked with Dr. Laurence SlateAroor of Neurology and they agreed with this regimen. They also spoke to Dr. Candise CheKale in Hematology and he felt a hypercoagulability workup was not needed, since she has been sedentary while recovering from a right total hip replacement on 08-21-18. Since then she has had a little less swelling but more pain in the right leg, especially in the lateral upper leg. She has been doing some stretching exercises at home. She is due to start formal PT on 09-17-18. No chest pain or SOB.    Review of Systems  Constitutional: Negative.   Respiratory: Negative.   Cardiovascular: Positive for leg swelling. Negative for chest pain and palpitations.  Musculoskeletal: Positive for arthralgias and myalgias.       Objective:   Physical Exam Constitutional:      Comments: In a wheelchair  Cardiovascular:     Rate and Rhythm: Normal rate and regular rhythm.     Pulses: Normal pulses.     Heart sounds: Normal heart sounds.  Pulmonary:     Effort: Pulmonary effort is normal.     Breath sounds: Normal breath sounds.  Musculoskeletal:     Comments: 2+ edema in the entire right leg. She is tender in the lateral upper right leg but not in the calf. No cords are felt   Neurological:     Mental Status: She is alert.           Assessment & Plan:  Right leg DVT. She will proceed with the Xarelto titration and then stay on 20 mg daily for 90 days. She will schedule follow up visits with Cardiology and Neurology soon. Start PT for Orthopedics. We will see her in 2 and 1/2 months and will likely  get another venous doppler at that point.  Gershon CraneStephen , MD

## 2018-09-14 ENCOUNTER — Emergency Department (HOSPITAL_COMMUNITY): Payer: Medicare Other

## 2018-09-14 ENCOUNTER — Encounter (HOSPITAL_COMMUNITY): Payer: Self-pay

## 2018-09-14 ENCOUNTER — Ambulatory Visit: Payer: Self-pay

## 2018-09-14 ENCOUNTER — Emergency Department (HOSPITAL_COMMUNITY)
Admission: EM | Admit: 2018-09-14 | Discharge: 2018-09-15 | Disposition: A | Discharge status: Home / Self Care | Payer: Medicare Other | Attending: Emergency Medicine | Admitting: Emergency Medicine

## 2018-09-14 DIAGNOSIS — Z8673 Personal history of transient ischemic attack (TIA), and cerebral infarction without residual deficits: Secondary | ICD-10-CM | POA: Insufficient documentation

## 2018-09-14 DIAGNOSIS — Z87891 Personal history of nicotine dependence: Secondary | ICD-10-CM | POA: Insufficient documentation

## 2018-09-14 DIAGNOSIS — I10 Essential (primary) hypertension: Secondary | ICD-10-CM | POA: Diagnosis not present

## 2018-09-14 DIAGNOSIS — Z79899 Other long term (current) drug therapy: Secondary | ICD-10-CM | POA: Insufficient documentation

## 2018-09-14 DIAGNOSIS — Z7901 Long term (current) use of anticoagulants: Secondary | ICD-10-CM | POA: Diagnosis not present

## 2018-09-14 DIAGNOSIS — R079 Chest pain, unspecified: Secondary | ICD-10-CM | POA: Insufficient documentation

## 2018-09-14 DIAGNOSIS — E785 Hyperlipidemia, unspecified: Secondary | ICD-10-CM | POA: Diagnosis not present

## 2018-09-14 DIAGNOSIS — E039 Hypothyroidism, unspecified: Secondary | ICD-10-CM | POA: Diagnosis not present

## 2018-09-14 LAB — BASIC METABOLIC PANEL
Anion gap: 7 (ref 5–15)
BUN: 9 mg/dL (ref 8–23)
CO2: 27 mmol/L (ref 22–32)
CREATININE: 0.95 mg/dL (ref 0.44–1.00)
Calcium: 9.6 mg/dL (ref 8.9–10.3)
Chloride: 107 mmol/L (ref 98–111)
GFR calc Af Amer: >60 mL/min (ref >60)
GFR calc non Af Amer: 59 mL/min — ABNORMAL LOW (ref >60)
Glucose, Bld: 122 mg/dL — ABNORMAL HIGH (ref 70–99)
Potassium: 3.2 mmol/L — ABNORMAL LOW (ref 3.5–5.1)
Sodium: 141 mmol/L (ref 135–145)

## 2018-09-14 LAB — CBC
HCT: 36.6 % (ref 36.0–46.0)
Hemoglobin: 11.6 g/dL — ABNORMAL LOW (ref 12.0–15.0)
MCH: 29.3 pg (ref 26.0–34.0)
MCHC: 31.7 g/dL (ref 30.0–36.0)
MCV: 92.4 fL (ref 80.0–100.0)
NRBC: 0 % (ref 0.0–0.2)
Platelets: 223 10*3/uL (ref 150–400)
RBC: 3.96 MIL/uL (ref 3.87–5.11)
RDW: 13.5 % (ref 11.5–15.5)
WBC: 5.6 10*3/uL (ref 4.0–10.5)

## 2018-09-14 LAB — I-STAT TROPONIN, ED: TROPONIN I, POC: 0 ng/mL (ref 0.00–0.08)

## 2018-09-14 NOTE — ED Triage Notes (Signed)
Pt reports Sob and chest pressure for the past 2 days. Pt states she feels like something is sitting on her chest. Resp e/u, lungs clear. nad noted in triage

## 2018-09-14 NOTE — Telephone Encounter (Signed)
Pt c/o feeling like there is a weight on her chest to the center between her breasts. Chest heaviness began yesterday and pt stated the heaviness is constant. Pt describes pain as moderate. Pt denies any radiation to her arms, face, jaw, back.  Pt was diagnosed with DVT 09/05/18 after having surgery for a total hip replacement.  Pt c/o extreme fatigue. Pt stated that she is having SOB with talking and had vertigo since Christmas. Pt has a cough.  Pt with h/o CVA 2 years ago.  Pt thinks her symptoms are as the result of a new blood thinner or new BP medication.  Pt has h/o HTN, high cholesterol and her mother's side has a strong family h/o heart disease.  Pt then stated her head feels weak. Denied headache but does have vertigo.  Pt initially refused to go to the ED. After speaking to her and discussing what could be causing this, she agreed [facility]. Pt given care advice and pt verbalized understanding.   Reason for Disposition . History of prior "blood clot" in leg or lungs (i.e., deep vein thrombosis, pulmonary embolism)  Answer Assessment - Initial Assessment Questions 1. LOCATION: "Where does it hurt?"       Center between breasts- feels like a weight on the chest 2. RADIATION: "Does the pain go anywhere else?" (e.g., into neck, jaw, arms, back)     no 3. ONSET: "When did the chest pain begin?" (Minutes, hours or days)      yesterday 4. PATTERN "Does the pain come and go, or has it been constant since it started?"  "Does it get worse with exertion?"      constant 5. DURATION: "How long does it last" (e.g., seconds, minutes, hours)     constant 6. SEVERITY: "How bad is the pain?"  (e.g., Scale 1-10; mild, moderate, or severe)    - MILD (1-3): doesn't interfere with normal activities     - MODERATE (4-7): interferes with normal activities or awakens from sleep    - SEVERE (8-10): excruciating pain, unable to do any normal activities       moderate 7. CARDIAC RISK  FACTORS: "Do you have any history of heart problems or risk factors for heart disease?" (e.g., prior heart attack, angina; high blood pressure, diabetes, being overweight, high cholesterol, smoking, or strong family history of heart disease)     HTN, high cholesterol, Mothers side of the family with strong family history of heart disease. 8. PULMONARY RISK FACTORS: "Do you have any history of lung disease?"  (e.g., blood clots in lung, asthma, emphysema, birth control pills)     CVA 2 years ago- hip replacement surgery in December 2019 found DVT right leg above the knee 9. CAUSE: "What do you think is causing the chest pain?"     New medication new blood thinner or new BP medication 10. OTHER SYMPTOMS: "Do you have any other symptoms?" (e.g., dizziness, nausea, vomiting, sweating, fever, difficulty breathing, cough)       SOB with talking, cough,vertigo since Christmas 11. PREGNANCY: "Is there any chance you are pregnant?" "When was your last menstrual period?"   n/a  Protocols used: CHEST PAIN-A-AH

## 2018-09-14 NOTE — ED Notes (Signed)
Pt given a pillow for her back for comfort

## 2018-09-15 ENCOUNTER — Other Ambulatory Visit: Payer: Self-pay

## 2018-09-15 ENCOUNTER — Encounter (HOSPITAL_COMMUNITY): Payer: Self-pay | Admitting: Radiology

## 2018-09-15 ENCOUNTER — Emergency Department (HOSPITAL_COMMUNITY): Payer: Medicare Other

## 2018-09-15 LAB — I-STAT TROPONIN, ED: TROPONIN I, POC: 0 ng/mL (ref 0.00–0.08)

## 2018-09-15 MED ORDER — IOPAMIDOL (ISOVUE-370) INJECTION 76%
100.0000 mL | Freq: Once | INTRAVENOUS | Status: AC | PRN
  Administered 2018-09-15: 100 mL via INTRAVENOUS

## 2018-09-15 MED ORDER — IOPAMIDOL (ISOVUE-370) INJECTION 76%
INTRAVENOUS | Status: AC
  Filled 2018-09-15: qty 100

## 2018-09-15 NOTE — ED Notes (Signed)
PT states understanding of care given, follow up care. PT ambulated from ED to car with a steady gait.  

## 2018-09-15 NOTE — ED Notes (Signed)
Pt taken to CT.

## 2018-09-15 NOTE — ED Provider Notes (Signed)
[facility] EMERGENCY DEPARTMENT Provider Note   CSN: [id number] Arrival date & time: 09/14/18  1649     History   Chief Complaint Chief Complaint  Patient presents with  . Chest Pain  . Shortness of Breath    HPI Abigail [name] is a 72 y.o. female.  HPI Patient presents with chest pain shortness of breath.  Has had for around the last 3 days.  Feels like there is something sitting on her chest.  Dull.  More fatigue.  The pain is not worse with exertion.  States she has had no fever.  A little bit of cough.  Had recent hip replacement and 10 days ago diagnosed with DVT in the right leg.  Has been on anticoagulation however. Past Medical History:  Diagnosis Date  . Bronchitis 09/2016  . Bruises easily   . DDD (degenerative disc disease)   . Degenerative joint disease of cervical spine 09-05-11   Cervival area, now some osteoarthritis-lower back and Rt. shoulder  . Depression   . Diverticulitis 09-05-11   hx. gastritis, diverticulitis x2 -now surgery planned  . DIVERTICULITIS, ACUTE 02/10/2010  . Dyspnea    with exertion  . Essential hypertension 09-05-11   tx. Verapamil  . GROIN PAIN 09/21/2010  . Headache(784.0)    headaches are better  . Hearing loss    right ear  . Heart palpitations   . Hemorrhoids   . History of bronchitis   . History of colonic polyps   . History of pneumonia   . History of tension headache   . Hyperlipidemia   . Hypertension   . Hypothyroidism 09-05-11   Supplement used  . Late effect of adverse effect of drug, medicinal or biological substance   . NECK PAIN, ACUTE 09/21/2010  . Neuromuscular disorder (HCC)    carpal tunnel in both hands  . Nocturia   . Osteoarthritis 112-17-12   spine and rt. hip, rt. shoulder  . Patent foramen ovale    Small - unable to be closed -   . Pneumonia   . PONV (postoperative nausea and vomiting)   . Sleep apnea 09-05-11   no cpap ever, had surgery to remove cartilage, no problems now  .  SORE THROAT 04/30/2009  . Stroke (HCC) 09-05-11   2006/2009-(loss of memory, balance issues remains occ.)  . TIA 09/28/2007, 10/2014  . Unstable angina (HCC)    a. 05/2010 Cath: nl cors, EF 55%;  b. 05/2015 Lexiscan MV: small, severe, fixed apical defect and a small, severe, reversible inf lateral defect w/ apical thinning and mild ischemia, EF 54%.  Marland Kitchen URI 09/02/2008  . Vertigo     Patient Active Problem List   Diagnosis Date Noted  . Primary localized osteoarthritis of right hip 08/21/2018  . Primary osteoarthritis of right hip 08/21/2018  . HTN (hypertension) 05/15/2018  . PFO (patent foramen ovale)   . Acute CVA (cerebrovascular accident) (HCC) 02/23/2017  . Right facial numbness 02/23/2017  . Chronic back pain 02/23/2017  . History of partial thyroidectomy 02/23/2017  . Spondylolisthesis of lumbar region 05/03/2016  . Dizziness and giddiness 06/04/2015  . Right-sided lacunar infarction (HCC) 06/04/2015  . Lacunar ataxic hemiparesis (HCC) 06/03/2015  . Unstable angina (HCC)   . Chest pain 05/07/2015  . Tick bite of right forearm 09/04/2014  . Ventral incisional hernia 12/13/2013  . Benign microscopic hematuria 03/05/2012  . Diverticulitis of sigmoid colon 05/04/2011  . PRECORDIAL PAIN 06/08/2010  . PALPITATIONS 05/27/2010  .  CERVICAL LYMPHADENOPATHY, RIGHT 05/15/2008  . Headache 02/28/2008  . TENSION HEADACHE 02/12/2008  . Sebaceous cyst 12/11/2007  . PROBLEMS WITH SWALLOWING AND MASTICATION 11/20/2007  . COLONIC POLYPS, HX OF 07/23/2007  . Hypothyroidism 03/06/2007  . Hyperlipidemia 03/06/2007  . Essential hypertension 03/06/2007  . History of cardiovascular disorder 03/06/2007    Past Surgical History:  Procedure Laterality Date  . ABDOMINAL HYSTERECTOMY    . BACK SURGERY    . CARDIAC CATHETERIZATION N/A 06/01/2015   Procedure: Left Heart Cath and Coronary Angiography;  Surgeon: Kathleene Hazelhristopher D McAlhany, MD;  [facility];  Service: Cardiovascular;   Laterality: N/A;  . COLON RESECTION  09/07/2011   Procedure: LAPAROSCOPIC SIGMOID COLON RESECTION;  Surgeon: Mariella SaaBenjamin T Hoxworth, MD;  Location: [facility] ORS;  Service: General;  Laterality: N/A;  with proctoscopy  . COLONOSCOPY  08/06/2015   per Dr. Kinnie ScalesMedoff, adenomatous polyps, repeat in 5 yrs   . CYST EXCISION N/A 11/29/2016   Procedure: EXCISION UMBILICAL CYST;  Surgeon: Glenna FellowsBenjamin Hoxworth, MD;  Location: [facility];  Service: General;  Laterality: N/A;  . ELBOW SURGERY  09-05-11   left elbow -ligament repair  . EYE SURGERY     cataract  . INSERTION OF MESH N/A 12/13/2013   Procedure: INSERTION OF MESH;  Surgeon: Mariella SaaBenjamin T Hoxworth, MD;  Location: [facility];  Service: General;  Laterality: N/A;  . LAPAROSCOPY  09/07/2011   Procedure: LAPAROSCOPY DIAGNOSTIC;  Surgeon: Roselle LocusJames E Tomblin II;  Location: [facility];  Service: Gynecology;  Laterality: N/A;  . MAXIMUM ACCESS (MAS)POSTERIOR LUMBAR INTERBODY FUSION (PLIF) 2 LEVEL N/A 05/03/2016   Procedure: L4-5 L5-S1 Maximum access posterior lumbar interbody fusion;  Surgeon: Maeola HarmanJoseph Stern, MD;  Location: [facility] NEURO ORS;  Service: Neurosurgery;  Laterality: N/A;  L4-5 L5-S1 Maximum access posterior lumbar interbody fusion  . SALPINGOOPHORECTOMY  09/07/2011   Procedure: SALPINGO OOPHERECTOMY;  Surgeon: Roselle LocusJames E Tomblin II;  Location: [facility];  Service: Gynecology;  Laterality: Right;  . THYROIDECTOMY, PARTIAL  09-05-11  . TOTAL HIP ARTHROPLASTY Right 08/21/2018   Procedure: TOTAL HIP ARTHROPLASTY ANTERIOR APPROACH;  Surgeon: Marcene Corningalldorf, Peter, MD;  Location: [facility] OR;  Service: Orthopedics;  Laterality: Right;  . TUBAL LIGATION  1983  . UMBILICAL HERNIA REPAIR  yrs ago  . VENTRAL HERNIA REPAIR  06/20/2012   Procedure: LAPAROSCOPIC VENTRAL HERNIA;  Surgeon: Mariella SaaBenjamin T Hoxworth, MD;  Location: [facility];  Service: General;  Laterality: N/A;  Laparoscopic Repair of Ventral Hernia with mesh  . VENTRAL HERNIA REPAIR N/A 12/13/2013   Procedure: LAPAROSCOPIC REPAIR RECURRENT VENTRAL  INCISIONAL  HERNIA;  Surgeon: Mariella SaaBenjamin T Hoxworth, MD;  Location: [facility];  Service: General;  Laterality: N/A;     OB History   No obstetric history on file.      Home Medications    Prior to Admission medications   Medication Sig Start Date End Date Taking? Authorizing Provider  atorvastatin (LIPITOR) 40 MG tablet Take 1 tablet (40 mg total) by mouth daily. Patient taking differently: Take 40 mg by mouth every evening.  08/02/18   Nelwyn SalisburyFry, Stephen A, MD  butalbital-acetaminophen-caffeine (FIORICET, ESGIC) 50-325-40 MG tablet TAKE 1 TABLET BY MOUTH EVERY 6 HOURS AS NEEDED FOR HEADACHE Patient taking differently: Take 1 tablet by mouth every 6 (six) hours as needed for headache. TAKE 1 TABLET BY MOUTH EVERY 6 HOURS AS NEEDED FOR HEADACHE 05/15/18   Nelwyn SalisburyFry, Stephen A, MD  levothyroxine (SYNTHROID, LEVOTHROID) 125 MCG tablet TAKE 1 TABLET BY MOUTH EVERY DAY BEFORE BREAKFAST Patient taking  differently: Take 125 mcg by mouth daily before breakfast. TAKE 1 TABLET BY MOUTH EVERY DAY BEFORE BREAKFAST 06/19/18   Roderick Pee, MD  losartan (COZAAR) 25 MG tablet Take 1 tablet (25 mg total) by mouth daily. 09/10/18   Nelwyn Salisbury, MD  Menthol, Topical Analgesic, (ASPERCREME MAX ROLL-ON EX) Apply 1 application topically daily as needed (back pain).    [provider]  rivaroxaban (XARELTO) 20 MG TABS tablet Take 1 tablet (20 mg total) by mouth daily with supper. 09/10/18   Nelwyn Salisbury, MD  Rivaroxaban 15 & 20 MG TBPK Take as directed on package: Start with one 15mg  tablet by mouth twice a day with food. On Day 22, switch to one 20mg  tablet once a day with food. 09/05/18   Benjiman Core, MD  tetrahydrozoline 0.05 % ophthalmic solution Place 1 drop into both eyes 4 (four) times daily as needed (for dry/irritated eyes.).    [provider]  tiZANidine (ZANAFLEX) 4 MG tablet Take 1 tablet (4 mg total) by mouth every 6 (six) hours as needed. 08/22/18 08/22/19  Elodia Florence, PA-C  traMADol  (ULTRAM) 50 MG tablet Take 1-2 tablets (50-100 mg total) by mouth every 6 (six) hours as needed for moderate pain or severe pain. TAKE 1 TO 2 TABLETS BY MOUTH EVERY 4 TO 6 HOURS AS NEEDED FOR PAIN 08/22/18   Elodia Florence, PA-C  verapamil (CALAN-SR) 120 MG CR tablet TAKE 1 TABLET (120 MG TOTAL) BY MOUTH AT BEDTIME. 08/24/18   Nelwyn Salisbury, MD    Family History Family History  Problem Relation Age of Onset  . Arrhythmia Mother   . Hypertension Mother   . Stroke Mother   . Stroke Maternal Grandmother   . Diabetes Paternal Grandmother     Social History Social History   Tobacco Use  . Smoking status: Former Smoker    Packs/day: 0.50    Years: 5.00    Pack years: 2.50    Types: Cigarettes    Last attempt to quit: 09/19/1968    Years since quitting: 50.0  . Smokeless tobacco: Never Used  Substance Use Topics  . Alcohol use: Yes    Alcohol/week: 2.0 standard drinks    Types: 1 Cans of beer, 1 Shots of liquor per week    Comment: rarely  . Drug use: No     Allergies   Dilaudid [hydromorphone hcl]; Shellfish allergy; Aggrenox [aspirin-dipyridamole er]; Cephalexin; Codeine phosphate; Hydrocodone; Meperidine hcl; and Percocet [oxycodone-acetaminophen]   Review of Systems Review of Systems  Constitutional: Negative for appetite change.  HENT: Negative for congestion.   Respiratory: Positive for shortness of breath.   Cardiovascular: Positive for chest pain.  Gastrointestinal: Negative for abdominal pain and blood in stool.  Genitourinary: Negative for flank pain.  Musculoskeletal: Negative for back pain.  Skin: Negative for rash.  Neurological: Negative for weakness.     Physical Exam Updated Vital Signs BP (!) 163/86   Pulse 63   Temp 99.1 F (37.3 C) (Oral)   Resp 18   LMP  (LMP Unknown)   SpO2 100%   Physical Exam HENT:     Head: Atraumatic.  Eyes:     Pupils: Pupils are equal, round, and reactive to light.  Cardiovascular:     Rate and Rhythm: Normal rate  and regular rhythm.  Pulmonary:     Effort: Pulmonary effort is normal.     Breath sounds: Normal breath sounds.  Abdominal:     Palpations: Abdomen  is soft.  Musculoskeletal:     Right lower leg: She exhibits tenderness. Edema present.     Left lower leg: No edema.     Comments: Right lower leg and calf tenderness with some edema.  Skin:    General: Skin is warm.     Capillary Refill: Capillary refill takes less than 2 seconds.  Neurological:     General: No focal deficit present.     Mental Status: She is alert.      ED Treatments / Results  Labs (all labs ordered are listed, but only abnormal results are displayed) Labs Reviewed  BASIC METABOLIC PANEL - Abnormal; Notable for the following components:      Result Value   Potassium 3.2 (*)    Glucose, Bld 122 (*)    GFR calc non Af Amer 59 (*)    All other components within normal limits  CBC - Abnormal; Notable for the following components:   Hemoglobin 11.6 (*)    All other components within normal limits  I-STAT TROPONIN, ED  I-STAT TROPONIN, ED    EKG EKG Interpretation  Date/Time:  Friday September 14 2018 16:53:45 EST Ventricular Rate:  72 PR Interval:  118 QRS Duration: 86 QT Interval:  396 QTC Calculation: 433 R Axis:   2 Text Interpretation:  Normal sinus rhythm Minimal voltage criteria for LVH, may be normal variant Nonspecific ST and T wave abnormality Abnormal ECG Confirmed by Benjiman CorePickering, Montia Haslip [phone]([id number]) on 09/15/2018 12:13:15 AM   Radiology Dg Chest 2 View  Result Date: 09/14/2018 CLINICAL DATA:  Mid chest pressure EXAM: CHEST - 2 VIEW COMPARISON:  07/12/2018 FINDINGS: Upper limits normal heart size as before. Uncoiled atherosclerotic aorta without aneurysm. Lungs are free of pulmonary consolidation. No overt pulmonary edema, effusion or pneumothorax. Mild degenerative change noted about both AC and glenohumeral joints and dorsal spine. IMPRESSION: No acute cardiopulmonary abnormality. Electronically  Signed   By: Tollie Ethavid  Kwon M.D.   On: 09/14/2018 18:02   Ct Angio Chest Pe W And/or Wo Contrast  Result Date: 09/15/2018 CLINICAL DATA:  Chest tightness and shortness of breath for 1 week EXAM: CT ANGIOGRAPHY CHEST WITH CONTRAST TECHNIQUE: Multidetector CT imaging of the chest was performed using the standard protocol during bolus administration of intravenous contrast. Multiplanar CT image reconstructions and MIPs were obtained to evaluate the vascular anatomy. CONTRAST:  100mL ISOVUE-370 IOPAMIDOL (ISOVUE-370) INJECTION 76% COMPARISON:  Chest x-ray September 14, 2018 FINDINGS: Cardiovascular: Satisfactory opacification of the pulmonary arteries to the segmental level. No evidence of pulmonary embolism. The heart size is enlarged. No pericardial effusion. Mediastinum/Nodes: No enlarged mediastinal, hilar, or axillary lymph nodes. Thyroid gland, trachea, and esophagus demonstrate no significant findings. Lungs/Pleura: Mild elect assess of posterior lung bases are noted. There is no focal pneumonia or pleural effusion. Upper Abdomen: No acute abnormality. Musculoskeletal: Degenerative joint changes of the spine are noted. Review of the MIP images confirms the above findings. IMPRESSION: No pulmonary embolus. Cardiomegaly.  Mild atelectasis of lung bases. Electronically Signed   By: Sherian ReinWei-Chen  Lin M.D.   On: 09/15/2018 01:23    Procedures Procedures (including critical care time)  Medications Ordered in ED Medications  iopamidol (ISOVUE-370) 76 % injection 100 mL (100 mLs Intravenous Contrast Given 09/15/18 0056)     Initial Impression / Assessment and Plan / ED Course  I have reviewed the triage vital signs and the nursing notes.  Pertinent labs & imaging results that were available during my care of the patient were reviewed by  me and considered in my medical decision making (see chart for details).     Patient with chest pain and pressure.  Dyspnea.  Recent DVT and surgery.  EKG reassuring.   Enzymes negative x2.  CT angiography done due to recent surgery.  No PE.  Discharge home.  Hemoglobin has improved from prior.  Final Clinical Impressions(s) / ED Diagnoses   Final diagnoses:  Nonspecific chest pain    ED Discharge Orders    None       Benjiman Core, MD 09/15/18 0236

## 2018-09-17 NOTE — Telephone Encounter (Signed)
FYI

## 2018-09-20 ENCOUNTER — Encounter: Payer: Self-pay | Admitting: Family Medicine

## 2018-09-20 ENCOUNTER — Ambulatory Visit: Payer: Medicare Other | Admitting: Family Medicine

## 2018-09-20 VITALS — BP 140/80 | HR 60 | Temp 98.3°F | Wt 175.2 lb

## 2018-09-20 DIAGNOSIS — R002 Palpitations: Secondary | ICD-10-CM | POA: Diagnosis not present

## 2018-09-20 DIAGNOSIS — I10 Essential (primary) hypertension: Secondary | ICD-10-CM

## 2018-09-20 DIAGNOSIS — I200 Unstable angina: Secondary | ICD-10-CM

## 2018-09-20 DIAGNOSIS — I824Z1 Acute embolism and thrombosis of unspecified deep veins of right distal lower extremity: Secondary | ICD-10-CM | POA: Diagnosis not present

## 2018-09-20 DIAGNOSIS — I82401 Acute embolism and thrombosis of unspecified deep veins of right lower extremity: Secondary | ICD-10-CM | POA: Insufficient documentation

## 2018-09-20 MED ORDER — POTASSIUM CHLORIDE ER 10 MEQ PO TBCR: 10.0000 meq | EXTENDED_RELEASE_TABLET | Freq: Every day | ORAL | 5 refills | Status: DC

## 2018-09-20 MED ORDER — BUTALBITAL-APAP-CAFFEINE 50-325-40 MG PO TABS: ORAL_TABLET | ORAL | 5 refills | Status: DC

## 2018-09-20 MED ORDER — TRAMADOL HCL 50 MG PO TABS: 50.0000 mg | ORAL_TABLET | Freq: Four times a day (QID) | ORAL | 2 refills | Status: DC | PRN

## 2018-09-20 NOTE — Progress Notes (Signed)
   Subjective:    Patient ID: Abigail [name], female    DOB: 1945/12/26, 73 y.o.   MRN: [id number]  HPI Here to follow up an ER visit on 09-14-18 for chest pain. She was recently found to have a right leg DVT and she has been taking Xarelto ever since. She continues to have pain in the right leg, but she also developed pressure and pain in the chest. No SOB or cough. At the ER her EKG was normal. Cardiac enzymes were negative. Other labs showed a low potassium at 3.2 and a stable Hgb at 11.6.  CT angiogram of the chest was normal. Today she feels better but she still has oocasional brief bouts of pounding in the chest or a sensation of her heart racing. She is scheduled to begin PT tomorrow as she recovers from hip replacement surgery. She has not contacted Cardiology yet.    Review of Systems  Constitutional: Negative.   Respiratory: Negative.   Cardiovascular: Positive for chest pain, palpitations and leg swelling.  Gastrointestinal: Negative.   Genitourinary: Negative.        Objective:   Physical Exam Constitutional:      Comments: Walks with a walker   Neck:     Musculoskeletal: Normal range of motion.  Cardiovascular:     Rate and Rhythm: Normal rate and regular rhythm.     Pulses: Normal pulses.     Heart sounds: Normal heart sounds.  Pulmonary:     Effort: Pulmonary effort is normal.     Breath sounds: Normal breath sounds.  Musculoskeletal:     Comments: The right leg appears normal with no swelling   Lymphadenopathy:     Cervical: No cervical adenopathy.  Neurological:     General: No focal deficit present.     Mental Status: She is alert and oriented to person, place, and time.           Assessment & Plan:  She is recovering from hip surgery and from a right leg DVT. Tomorrow her Xarelto dose changes to 20 mg daily. It is not clear how to explain the chest pressure and palpitations she is having, but her low potassium may be playing a role. She will start on  Klor-con 10 mEq daily. I urged her to make an appt with Cardiology soon as well.  Gershon Crane, MD

## 2018-09-23 NOTE — Progress Notes (Signed)
Cardiology Office Note   Date:  09/24/2018   ID:  Abigail DerryLynn G [name], DOB 07/10/1946, MRN [id number]  PCP:  Nelwyn SalisburyFry, Stephen A, MD  Cardiologist: Dr. Jens Somrenshaw Chief Complaint  Patient presents with  . Follow-up     History of Present Illness: Abigail DerryLynn G Verne is a 73 y.o. female who presents for ongoing assessment and management of palpitations and presyncope, with h/o PFO, CVA. Cardiac cath 05/2015 revealed non-obstructive CAD. She was last seen by Dr.Crenshaw on 11/23/2017 at which time she was complaining of atypical chest pain.  Since being seen last she has had right hip surgery and had complications of right DVT. She was started on Xarelto. She complained of recurrent chest pain when seen by her PCP on 09/20/2008, and was found to have mild hypokalemia. She was started on Klor-Con 10 mEq daily.   She comes today tearful complaining of ongoing pain in her right leg and right foot. She states she cannot sleep at night due to the pain. She has been placed on tramadol by surgery but she states that it is not helpful. She cannot take opioids. She has been having some complaints of palpitations, but usually associated with anxiety. She is not on and medications for anxiety or depression.   Past Medical History:  Diagnosis Date  . Bronchitis 09/2016  . Bruises easily   . DDD (degenerative disc disease)   . Degenerative joint disease of cervical spine 09-05-11   Cervival area, now some osteoarthritis-lower back and Rt. shoulder  . Depression   . Diverticulitis 09-05-11   hx. gastritis, diverticulitis x2 -now surgery planned  . DIVERTICULITIS, ACUTE 02/10/2010  . Dyspnea    with exertion  . Essential hypertension 09-05-11   tx. Verapamil  . GROIN PAIN 09/21/2010  . Headache(784.0)    headaches are better  . Hearing loss    right ear  . Heart palpitations   . Hemorrhoids   . History of bronchitis   . History of colonic polyps   . History of pneumonia   . History of tension headache   . Hyperlipidemia     . Hypertension   . Hypothyroidism 09-05-11   Supplement used  . Late effect of adverse effect of drug, medicinal or biological substance   . NECK PAIN, ACUTE 09/21/2010  . Neuromuscular disorder (HCC)    carpal tunnel in both hands  . Nocturia   . Osteoarthritis 112-17-12   spine and rt. hip, rt. shoulder  . Patent foramen ovale    Small - unable to be closed -   . Pneumonia   . PONV (postoperative nausea and vomiting)   . Sleep apnea 09-05-11   no cpap ever, had surgery to remove cartilage, no problems now  . SORE THROAT 04/30/2009  . Stroke (HCC) 09-05-11   2006/2009-(loss of memory, balance issues remains occ.)  . TIA 09/28/2007, 10/2014  . Unstable angina (HCC)    a. 05/2010 Cath: nl cors, EF 55%;  b. 05/2015 Lexiscan MV: small, severe, fixed apical defect and a small, severe, reversible inf lateral defect w/ apical thinning and mild ischemia, EF 54%.  Marland Kitchen. URI 09/02/2008  . Vertigo     Past Surgical History:  Procedure Laterality Date  . ABDOMINAL HYSTERECTOMY    . BACK SURGERY    . CARDIAC CATHETERIZATION N/A 06/01/2015   Procedure: Left Heart Cath and Coronary Angiography;  Surgeon: Kathleene Hazelhristopher D McAlhany, MD;  [facility];  Service: Cardiovascular;  Laterality: N/A;  . COLON  RESECTION  09/07/2011   Procedure: LAPAROSCOPIC SIGMOID COLON RESECTION;  Surgeon: Mariella SaaBenjamin T Hoxworth, MD;  Location: [facility];  Service: General;  Laterality: N/A;  with proctoscopy  . COLONOSCOPY  08/06/2015   per Dr. Kinnie ScalesMedoff, adenomatous polyps, repeat in 5 yrs   . CYST EXCISION N/A 11/29/2016   Procedure: EXCISION UMBILICAL CYST;  Surgeon: Glenna FellowsBenjamin Hoxworth, MD;  Location: [facility];  Service: General;  Laterality: N/A;  . ELBOW SURGERY  09-05-11   left elbow -ligament repair  . EYE SURGERY     cataract  . INSERTION OF MESH N/A 12/13/2013   Procedure: INSERTION OF MESH;  Surgeon: Mariella SaaBenjamin T Hoxworth, MD;  Location: [facility];  Service: General;  Laterality: N/A;  . LAPAROSCOPY  09/07/2011    Procedure: LAPAROSCOPY DIAGNOSTIC;  Surgeon: Roselle LocusJames E Tomblin II;  Location: [facility];  Service: Gynecology;  Laterality: N/A;  . MAXIMUM ACCESS (MAS)POSTERIOR LUMBAR INTERBODY FUSION (PLIF) 2 LEVEL N/A 05/03/2016   Procedure: L4-5 L5-S1 Maximum access posterior lumbar interbody fusion;  Surgeon: Maeola HarmanJoseph Stern, MD;  Location: [facility];  Service: Neurosurgery;  Laterality: N/A;  L4-5 L5-S1 Maximum access posterior lumbar interbody fusion  . SALPINGOOPHORECTOMY  09/07/2011   Procedure: SALPINGO OOPHERECTOMY;  Surgeon: Roselle LocusJames E Tomblin II;  Location: [facility];  Service: Gynecology;  Laterality: Right;  . THYROIDECTOMY, PARTIAL  09-05-11  . TOTAL HIP ARTHROPLASTY Right 08/21/2018   Procedure: TOTAL HIP ARTHROPLASTY ANTERIOR APPROACH;  Surgeon: Marcene Corningalldorf, Peter, MD;  Location: [facility];  Service: Orthopedics;  Laterality: Right;  . TUBAL LIGATION  1983  . UMBILICAL HERNIA REPAIR  yrs ago  . VENTRAL HERNIA REPAIR  06/20/2012   Procedure: LAPAROSCOPIC VENTRAL HERNIA;  Surgeon: Mariella SaaBenjamin T Hoxworth, MD;  Location: [facility];  Service: General;  Laterality: N/A;  Laparoscopic Repair of Ventral Hernia with mesh  . VENTRAL HERNIA REPAIR N/A 12/13/2013   Procedure: LAPAROSCOPIC REPAIR RECURRENT VENTRAL INCISIONAL  HERNIA;  Surgeon: Mariella SaaBenjamin T Hoxworth, MD;  Location: [facility];  Service: General;  Laterality: N/A;     Current Outpatient Medications  Medication Sig Dispense Refill  . atorvastatin (LIPITOR) 40 MG tablet Take 1 tablet (40 mg total) by mouth daily. (Patient taking differently: Take 40 mg by mouth every evening. ) 90 tablet 0  . butalbital-acetaminophen-caffeine (FIORICET, ESGIC) 50-325-40 MG tablet TAKE 1 TABLET BY MOUTH EVERY 6 HOURS AS NEEDED FOR HEADACHE 60 tablet 5  . levothyroxine (SYNTHROID, LEVOTHROID) 125 MCG tablet TAKE 1 TABLET BY MOUTH EVERY DAY BEFORE BREAKFAST (Patient taking differently: Take 125 mcg by mouth daily before breakfast. TAKE 1 TABLET BY MOUTH EVERY DAY BEFORE BREAKFAST) 90 tablet 0  .  losartan (COZAAR) 25 MG tablet Take 1 tablet (25 mg total) by mouth daily. 30 tablet 5  . Menthol, Topical Analgesic, (ASPERCREME MAX ROLL-ON EX) Apply 1 application topically daily as needed (back pain).    . potassium chloride (K-DUR) 10 MEQ tablet Take 1 tablet (10 mEq total) by mouth daily. 30 tablet 5  . rivaroxaban (XARELTO) 20 MG TABS tablet Take 1 tablet (20 mg total) by mouth daily with supper. 30 tablet 5  . Rivaroxaban 15 & 20 MG TBPK Take as directed on package: Start with one 15mg  tablet by mouth twice a day with food. On Day 22, switch to one 20mg  tablet once a day with food. 51 each 0  . tetrahydrozoline 0.05 % ophthalmic solution Place 1 drop into both eyes 4 (four) times daily as needed (for dry/irritated eyes.).    Marland Kitchen. tiZANidine (  ZANAFLEX) 4 MG tablet Take 1 tablet (4 mg total) by mouth every 6 (six) hours as needed. 40 tablet 1  . traMADol (ULTRAM) 50 MG tablet Take 1-2 tablets (50-100 mg total) by mouth every 6 (six) hours as needed for moderate pain or severe pain. TAKE 1 TO 2 TABLETS BY MOUTH EVERY 4 TO 6 HOURS AS NEEDED FOR PAIN 120 tablet 2  . verapamil (CALAN-SR) 120 MG CR tablet TAKE 1 TABLET (120 MG TOTAL) BY MOUTH AT BEDTIME. 30 tablet 2   No current facility-administered medications for this visit.     Allergies:   Dilaudid [hydromorphone hcl]; Shellfish allergy; Aggrenox [aspirin-dipyridamole er]; Cephalexin; Codeine phosphate; Hydrocodone; Meperidine hcl; and Percocet [oxycodone-acetaminophen]    Social History:  The patient  reports that she quit smoking about 50 years ago. Her smoking use included cigarettes. She has a 2.50 pack-year smoking history. She has never used smokeless tobacco. She reports current alcohol use of about 2.0 standard drinks of alcohol per week. She reports that she does not use drugs.   Family History:  The patient's family history includes Arrhythmia in her mother; Diabetes in her paternal grandmother; Hypertension in her mother; Stroke in  her maternal grandmother and mother.    ROS: All other systems are reviewed and negative. Unless otherwise mentioned in H&P    PHYSICAL EXAM: VS:  BP (!) 154/84   Pulse 69   Ht 5' 5.5" (1.664 m)   Wt 173 lb 3.2 oz (78.6 kg)   LMP  (LMP Unknown)   BMI 28.38 kg/m  , BMI Body mass index is 28.38 kg/m. GEN: Well nourished, well developed, in no acute distress HEENT: normal Neck: no JVD, carotid bruits, or masses Cardiac: RRR; no murmurs, rubs, or gallops,no edema  Respiratory:  Clear to auscultation bilaterally, normal work of breathing GI: soft, nontender, nondistended, + BS MS: no deformity or atrophy Skin: warm and dry, no rash Neuro:  Strength and sensation are intact Psych: euthymic mood, full affect, tearful.    EKG: Not completed today.   Recent Labs: 05/15/2018: TSH 1.52 07/23/2018: ALT 10 09/14/2018: BUN 9; Creatinine, Ser 0.95; Hemoglobin 11.6; Platelets 223; Potassium 3.2; Sodium 141    Lipid Panel    Component Value Date/Time   CHOL 162 05/15/2018 1528   CHOL CANCELED 11/17/2015 1245   TRIG 88.0 05/15/2018 1528   HDL 75.20 05/15/2018 1528   HDL CANCELED 11/17/2015 1245   CHOLHDL 2 05/15/2018 1528   VLDL 17.6 05/15/2018 1528   LDLCALC 69 05/15/2018 1528   LDLCALC 70 11/17/2015 1035      Wt Readings from Last 3 Encounters:  09/24/18 173 lb 3.2 oz (78.6 kg)  09/20/18 175 lb 4 oz (79.5 kg)  09/10/18 176 lb 4 oz (79.9 kg)    Other studies Reviewed: Echocardiogram 12-23-17  Left ventricle: The cavity size was normal. There was mild focal   basal hypertrophy of the septum. Systolic function was vigorous.   The estimated ejection fraction was in the range of 65% to 70%.   Wall motion was normal; there were no regional wall motion   abnormalities. Doppler parameters are consistent with abnormal   left ventricular relaxation (grade 1 diastolic dysfunction). - Aortic valve: Trileaflet; mildly thickened, mildly calcified   leaflets.  ASSESSMENT AND  PLAN:  1. DVT: Continues on Xarelto 20 mg daily. She is still having some neurologia related to this. She only takes tramadol prn, I have asked her to take it every 8 hours to keep  ahead of the pain. I have recommended that she follow up with PCP for referral to Pain Management physician as she cannot take opioids. It is best if she has a specialist help her with pain control.   2. Hypertension: She is on losartan 50 mg daily. BP is slightly elevated. She is in pain and emotionally upset. I will not change this dose right now.   3. Hx of CVA: R/T PFO. She is allergic to ASA. Continue Xarelto.   4.  Hypercholesterolemia: Continue statin therapy for now. Doubt her pain is myalgia pain as she describes this as burning in her right leg and right foot. Marland Kitchen   5.Anxiety and Depression: She is very upset and tearful. Hx of depression  Consider adding SSRI or other antidepressant when seen by PCP on follow up.   Current medicines are reviewed at length with the patient today.    Labs/ tests ordered today include: None    Bettey Mare. Liborio Nixon, ANP, AACC   09/24/2018 4:20 PM    [facility] [address] Office [phone] Fax [phone]

## 2018-09-24 ENCOUNTER — Ambulatory Visit (INDEPENDENT_AMBULATORY_CARE_PROVIDER_SITE_OTHER): Payer: Medicare Other | Admitting: Adult Health

## 2018-09-24 ENCOUNTER — Encounter: Payer: Self-pay | Admitting: Adult Health

## 2018-09-24 ENCOUNTER — Telehealth: Payer: Self-pay

## 2018-09-24 VITALS — BP 154/84 | HR 69 | Ht 65.5 in | Wt 173.2 lb

## 2018-09-24 DIAGNOSIS — I10 Essential (primary) hypertension: Secondary | ICD-10-CM

## 2018-09-24 DIAGNOSIS — I639 Cerebral infarction, unspecified: Secondary | ICD-10-CM

## 2018-09-24 DIAGNOSIS — Q211 Atrial septal defect: Secondary | ICD-10-CM | POA: Diagnosis not present

## 2018-09-24 DIAGNOSIS — E7800 Pure hypercholesterolemia, unspecified: Secondary | ICD-10-CM | POA: Diagnosis not present

## 2018-09-24 DIAGNOSIS — M79604 Pain in right leg: Secondary | ICD-10-CM

## 2018-09-24 DIAGNOSIS — Q2112 Patent foramen ovale: Secondary | ICD-10-CM

## 2018-09-24 DIAGNOSIS — G8929 Other chronic pain: Secondary | ICD-10-CM

## 2018-09-24 NOTE — Telephone Encounter (Signed)
Dr. Fry please advise. Thanks  

## 2018-09-24 NOTE — Telephone Encounter (Signed)
Copied from CRM [id number]. Topic: Referral - Request for Referral >> Sep 24, 2018  4:42 PM Floria Raveling A wrote:  Has patient seen PCP for this complaint? yes *If NO, is insurance requiring patient see PCP for this issue before PCP can refer them? Referral for which specialty: Pain management  Preferred provider/office: Any one that is suggested  Reason for referral: pt stated she seen her cardiologist and he suggest for her to call her pcp and ask for a pain management referral for her foot pain that she has been seen for. She stated it is a 24 hour and she is not sleeping  Best number - [phone]

## 2018-09-24 NOTE — Patient Instructions (Signed)
Special Instructions: PLEASE CALL PRIMARY CARE MD FOR PAIN MANAGEMENT REFFERAL  Follow-Up: You will need a follow up appointment in 6 months.  Please call our office 2 months in advance(MAY 2020) to schedule this(JULY 2020) appointment.  You may see Olga MillersBrian Crenshaw, MD Joni ReiningKathryn Lawrence, DNP, AACC  or one of the following Advanced Practice Providers on your designated Care Team:  Corine ShelterLuke Kilroy, [place]  Judy PimpleKrista Kroeger, PA-C  Marjie Skiffallie Goodrich, [place]C    Medication Instructions:  NO CHANGES- Your physician recommends that you continue on your current medications as directed. Please refer to the Current Medication list given to you today. If you need a refill on your cardiac medications before your next appointment, please call your pharmacy.  Labwork: When you have labs (blood work) and your tests are completely normal, you will receive your results ONLY by MyChart Message (if you have MyChart) -OR- A paper copy in the mail.  At [facility], you and your health needs are our priority.  As part of our continuing mission to provide you with exceptional heart care, we have created designated Provider Care Teams.  These Care Teams include your primary Cardiologist (physician) and Advanced Practice Providers (APPs -  Physician Assistants and Nurse Practitioners) who all work together to provide you with the care you need, when you need it.  Thank you for choosing [facility] at [facility]!!

## 2018-09-25 NOTE — Telephone Encounter (Signed)
The referral to a Pain Clinic was done

## 2018-09-28 ENCOUNTER — Other Ambulatory Visit: Payer: Self-pay | Admitting: Family Medicine

## 2018-09-28 MED ORDER — LEVOTHYROXINE SODIUM 125 MCG PO TABS: 125.0000 ug | ORAL_TABLET | Freq: Every day | ORAL | 1 refills | Status: DC

## 2018-10-22 ENCOUNTER — Other Ambulatory Visit: Payer: Self-pay | Admitting: Family Medicine

## 2018-10-22 DIAGNOSIS — Z1231 Encounter for screening mammogram for malignant neoplasm of breast: Secondary | ICD-10-CM

## 2018-10-26 ENCOUNTER — Encounter: Payer: Self-pay | Admitting: Family Medicine

## 2018-10-26 ENCOUNTER — Ambulatory Visit: Payer: Medicare Other | Admitting: Family Medicine

## 2018-10-26 VITALS — BP 142/82 | HR 58 | Temp 98.7°F | Wt 173.1 lb

## 2018-10-26 DIAGNOSIS — I10 Essential (primary) hypertension: Secondary | ICD-10-CM | POA: Diagnosis not present

## 2018-10-26 MED ORDER — LOSARTAN POTASSIUM 50 MG PO TABS: 50.0000 mg | ORAL_TABLET | Freq: Every day | ORAL | 3 refills | Status: DC

## 2018-10-26 NOTE — Progress Notes (Signed)
   Subjective:    Patient ID: [name], female    DOB: Sep 25, 1945, 73 y.o.   MRN: [id number]  HPI Here with concerns about her BP. At home over the past week she has been getting some readings in the 150s over 100s and she has had a few mild headaches. This am it was 154/100.    Review of Systems  Constitutional: Negative.   Respiratory: Negative.   Cardiovascular: Negative.   Neurological: Positive for headaches.       Objective:   Physical Exam Cardiovascular:     Rate and Rhythm: Normal rate and regular rhythm.     Pulses: Normal pulses.     Heart sounds: Normal heart sounds.  Pulmonary:     Effort: Pulmonary effort is normal.     Breath sounds: Normal breath sounds.  Musculoskeletal:     Right lower leg: No edema.     Left lower leg: No edema.  Neurological:     General: No focal deficit present.     Mental Status: She is alert and oriented to person, place, and time.           Assessment & Plan:  Her HTN is slightly out of control. We will increase the Losartan to 50 mg daily. She will follow up prn.  Gershon Crane, MD

## 2018-10-31 ENCOUNTER — Encounter

## 2018-10-31 ENCOUNTER — Encounter: Payer: Self-pay | Admitting: Neurology

## 2018-10-31 ENCOUNTER — Ambulatory Visit: Payer: Medicare Other | Admitting: Neurology

## 2018-10-31 VITALS — BP 148/83 | HR 56 | Wt 175.6 lb

## 2018-10-31 DIAGNOSIS — I6990 Unspecified sequelae of unspecified cerebrovascular disease: Secondary | ICD-10-CM

## 2018-10-31 NOTE — Patient Instructions (Signed)
I had a long discussion with the patient and her husband regarding her recent diagnosis of deep vein thrombosis and need to switch Plavix to Xarelto.  She will stay on Xarelto for 6 to 9 months and once it is discontinued she may go back on Plavix for stroke prevention.  Continue strict control of hypertension with blood pressure goal below 130/90, lipids with LDL cholesterol goal below 70 mg percent and diabetes with hemoglobin A1c goal below 6.5%.  She was encouraged to use a cane at all times for fall and safety precautions.  She will return for follow-up in the future only as necessary.

## 2018-10-31 NOTE — Progress Notes (Signed)
[facility] [address] [place]. [place] [address] [phone]       OFFICE FOLLOW UP VISIT NOTE  Abigail [name] Date of Birth:  [birth year] Medical Record Number:  [id number]   Referring MD:  Beverely Low  Reason for Referral:  Dizziness and abnormal MRI brain  HPI: Initial Consult 06/03/2015 :  Abigail [name] is a 57 year pleasant lady whose had five-month history of gait and balance difficulties. She just feels she often leans to the left and stumbles if she is not careful. She feels the symptoms began suddenly one day when she noticed trouble with balance and walking she also had some trouble swallowing but she did not seek immediate help at that time. She does have a prior history of her left thalamic lacunar infarct in 2007 for which she had seen me. That was felt to be due to small vessel disease and she was started on Aggrenox which did not tolerate due to side effects. She was lost to medical follow-up to me. She is recently had an outpatient MRI scan of the brain which have personally reviewed on 05/02/15 which does show a right pontine and cerebellar hyperintensity which is likely a age indeterminate lacunar infarcts which were not noticed on the previous MRI and perhaps may be the explanation of her recent dizziness and gait difficulties. The patient is currently on Plavix which is tolerating well without bleeding or bruising. She states her blood pressure has been difficult to control and today it is elevated at 171/97 in office. She did also had some recent chest pain or shortness of breath for which she underwent cardiac catheterization last Monday which did not show any correctable lesion. Dr. Jens Som for cardiologist has also added a baby aspirin and she seems to be having increased bruising tendency since then. She's not sure when her last lipid profile was checked but she does take Lipitor 40 mg daily which is tolerating well without side effects. She does have  history of sleep apnea and had surgery for that and does not use CPAP Update 11/17/15 : She returns for follow-up after last visit 5 months ago. She is accompanied by husband. Patient states she was doing fine until a few weeks ago since then she's noticed intermittent tingling in the left hand as well as weakness and dropping of objects from that hand. She's also noticed some walking difficulty and having to drag her left leg. She denies any slurred speech, headache, fall or injury. She was seen by me in September of last year and at that time MRI scan of the brain prior to that visit had shown small remote age lacunar infarcts in the right cerebellum and pons. I had ordered MRA of the neck and brain which have personally reviewed done on 06/03/15 which showed no significant intra-or extracranial stenosis. Lab work on 06/03/15 showed LDL cholesterol 84 but HDL of 77 with the ratio of 2.3. Hemoglobin A1c was borderline at 6.0. Patient has been on Plavix since 2006 following her previous stroke. She was unable to tolerate Aggrenox at that time. Patient states she is having gradual worsening of her balance and walking since spring of last year and perhaps had a small stroke at that time which was not recognized. She states she is tolerating Plavix well without bleeding or bruising. Her blood pressure is well controlled and today it is 139/76. The patient is refusing a referral to physical outpatient therapy but she wants to go and participate  in the Silver sneakers program at the [facility]. She is tolerating Lipitor without myalgias or arthralgias. She does use a CPAP for sleep apnea. Update 05/19/2017 ; she returns for follow-up after last visit for year and a half ago. She however was admitted to the hospital in June 2018 with a right lip and hand paresthesias. MRI scan did show a small left thalamic lacunar infarct. MRA of the brain was unremarkable. Carotid Doppler showed no significant extracranial stenosis.  Transthoracic echo showed normal ejection fraction. LDL cholesterol was borderline at 72 mg percent. Hemoglobin A1c was 6.1. Patient was started on aspirin 81 and Plavix 75 mg per 3 weeks and since then she has discontinued aspirin and is currently on Plavix alone. She is tolerating well without bruising or bleeding. She states her right lip numbness has resolved but right little finger in [place] persists. She has seen Dr. Venetia Maxon for back pain and he has discontinued both) and replace it with Aleve and tramadol. Patient also complains of subjective mild memory difficulties since her stroke. She has trouble remembering recent information  and names. Update 10/31/2018 : She returns for follow-up after last visit a year and half ago.  She is accompanied by her husband.  Patient is doing well from stroke standpoint without recurrent stroke or TIA symptoms.  She states she had hip replacement surgery in December 2019 and a few days later developed leg pain and swelling and was diagnosed as femoral vein deep vein thrombosis.  She has since stopped Plavix and has been switched to Xarelto which she seems to be tolerating well without bleeding or bruising.  She states her blood pressure is under good control though today it is slightly elevated at 1 four 8/83.  She is tolerating Lipitor well without any side effects.  Her last lipid profile was satisfactory.  She has no new neurological complaints.  She is walking with a cane and has had no falls or injuries.  She states her mild subjective memory difficulties are unchanged and are not getting worse ROS:   14 system review of systems is positive for joint pain, insomnia, hip pain, difficulty walking and all other systems negative e and all other systems negative  PMH:  Past Medical History:  Diagnosis Date  . Bronchitis 09/2016  . Bruises easily   . DDD (degenerative disc disease)   . Degenerative joint disease of cervical spine 09-05-11   Cervival area, now  some osteoarthritis-lower back and Rt. shoulder  . Depression   . Diverticulitis 09-05-11   hx. gastritis, diverticulitis x2 -now surgery planned  . DIVERTICULITIS, ACUTE 02/10/2010  . Dyspnea    with exertion  . Essential hypertension 09-05-11   tx. Verapamil  . GROIN PAIN 09/21/2010  . Headache(784.0)    headaches are better  . Hearing loss    right ear  . Heart palpitations   . Hemorrhoids   . History of bronchitis   . History of colonic polyps   . History of pneumonia   . History of tension headache   . Hyperlipidemia   . Hypertension   . Hypothyroidism 09-05-11   Supplement used  . Late effect of adverse effect of drug, medicinal or biological substance   . NECK PAIN, ACUTE 09/21/2010  . Neuromuscular disorder (HCC)    carpal tunnel in both hands  . Nocturia   . Osteoarthritis 112-17-12   spine and rt. hip, rt. shoulder  . Patent foramen ovale    Small - unable to be  closed -   . Pneumonia   . PONV (postoperative nausea and vomiting)   . Sleep apnea 09-05-11   no cpap ever, had surgery to remove cartilage, no problems now  . SORE THROAT 04/30/2009  . Stroke (HCC) 09-05-11   2006/2009-(loss of memory, balance issues remains occ.)  . TIA 09/28/2007, 10/2014  . Unstable angina (HCC)    a. 05/2010 Cath: nl cors, EF 55%;  b. 05/2015 Lexiscan MV: small, severe, fixed apical defect and a small, severe, reversible inf lateral defect w/ apical thinning and mild ischemia, EF 54%.  Marland Kitchen URI 09/02/2008  . Vertigo     Social History:  Social History   Socioeconomic History  . Marital status: Married    Spouse name: Not on file  . Number of children: Not on file  . Years of education: Not on file  . Highest education level: Not on file  Occupational History  . Not on file  Social Needs  . Financial resource strain: Not on file  . Food insecurity:    Worry: Not on file    Inability: Not on file  . Transportation needs:    Medical: Not on file    Non-medical: Not on file    Tobacco Use  . Smoking status: Former Smoker    Packs/day: 0.50    Years: 5.00    Pack years: 2.50    Types: Cigarettes    Last attempt to quit: 09/19/1968    Years since quitting: 50.1  . Smokeless tobacco: Never Used  Substance and Sexual Activity  . Alcohol use: Yes    Alcohol/week: 2.0 standard drinks    Types: 1 Cans of beer, 1 Shots of liquor per week    Comment: rarely  . Drug use: No  . Sexual activity: Yes  Lifestyle  . Physical activity:    Days per week: Not on file    Minutes per session: Not on file  . Stress: Not on file  Relationships  . Social connections:    Talks on phone: Not on file    Gets together: Not on file    Attends religious service: Not on file    Active member of club or organization: Not on file    Attends meetings of clubs or organizations: Not on file    Relationship status: Not on file  . Intimate partner violence:    Fear of current or ex partner: Not on file    Emotionally abused: Not on file    Physically abused: Not on file    Forced sexual activity: Not on file  Other Topics Concern  . Not on file  Social History Narrative   Lives in [place] with her husband.  She does not routinely exercise.    Medications:   Current Outpatient Medications on File Prior to Visit  Medication Sig Dispense Refill  . atorvastatin (LIPITOR) 40 MG tablet Take 1 tablet (40 mg total) by mouth daily. (Patient taking differently: Take 40 mg by mouth every evening. ) 90 tablet 0  . butalbital-acetaminophen-caffeine (FIORICET, ESGIC) 50-325-40 MG tablet TAKE 1 TABLET BY MOUTH EVERY 6 HOURS AS NEEDED FOR HEADACHE 60 tablet 5  . levothyroxine (SYNTHROID, LEVOTHROID) 125 MCG tablet Take 1 tablet (125 mcg total) by mouth daily before breakfast. TAKE 1 TABLET BY MOUTH EVERY DAY BEFORE BREAKFAST 90 tablet 1  . losartan (COZAAR) 50 MG tablet Take 1 tablet (50 mg total) by mouth daily. 30 tablet 3  . Menthol, Topical Analgesic, (ASPERCREME  MAX ROLL-ON EX) Apply 1  application topically daily as needed (back pain).    . potassium chloride (K-DUR) 10 MEQ tablet Take 1 tablet (10 mEq total) by mouth daily. 30 tablet 5  . rivaroxaban (XARELTO) 20 MG TABS tablet Take 1 tablet (20 mg total) by mouth daily with supper. 30 tablet 5  . tetrahydrozoline 0.05 % ophthalmic solution Place 1 drop into both eyes 4 (four) times daily as needed (for dry/irritated eyes.).    Marland Kitchen. traMADol (ULTRAM) 50 MG tablet Take 1-2 tablets (50-100 mg total) by mouth every 6 (six) hours as needed for moderate pain or severe pain. TAKE 1 TO 2 TABLETS BY MOUTH EVERY 4 TO 6 HOURS AS NEEDED FOR PAIN 120 tablet 2  . verapamil (CALAN-SR) 120 MG CR tablet TAKE 1 TABLET (120 MG TOTAL) BY MOUTH AT BEDTIME. 30 tablet 2   No current facility-administered medications on file prior to visit.     Allergies:   Allergies  Allergen Reactions  . Dilaudid [Hydromorphone Hcl] Shortness Of Breath, Nausea And Vomiting and Other (See Comments)    Able to take morphine without issue  . Shellfish Allergy Shortness Of Breath and Rash    Only shrimp allergy  . Aggrenox [Aspirin-Dipyridamole Er] Nausea And Vomiting and Other (See Comments)    Headache   . Cephalexin Itching and Rash  . Codeine Phosphate Nausea And Vomiting  . Hydrocodone Nausea And Vomiting and Other (See Comments)    Can take morphine without issue  . Meperidine Hcl Nausea And Vomiting  . Percocet [Oxycodone-Acetaminophen] Nausea And Vomiting and Other (See Comments)    Can take morphine without issue    Physical Exam General: well developed, well nourished elderly African-American lady , seated, in no evident distress Head: head normocephalic and atraumatic.   Neck: supple with no carotid or supraclavicular bruits Cardiovascular: regular rate and rhythm, no murmurs Musculoskeletal: no deformity Skin:  no rash/petichiae Vascular:  Normal pulses all extremities  Neurologic Exam Mental Status: Awake and fully alert. Oriented to place  and time. Recent and remote memory intact. Attention span, concentration and fund of knowledge appropriate. Mood and affect appropriate. Recall 3/3. Able to name 11 animals with four legs. Clock drawing 4/4. Cranial Nerves: Fundoscopic exam not done. Pupils equal, briskly reactive to light. Extraocular movements full without nystagmus. Visual fields full to confrontation. Hearing intact. Facial sensation intact. Face, tongue, palate moves normally and symmetrically.  Motor: Normal bulk and tone.Mild weakness of left grip and intrinsic hand muscles. Orbits right over left upper extremity. Mild weakness of left hip flexors only.  Sensory.: intact to touch , pinprick , position and vibratory sensation. Mild subjective paresthesias in the right little finger but no objective sensory loss Coordination: Rapid alternating movements normal in all extremities. Finger-to-nose and heel-to-shin performed accurately bilaterally. Gait and Station: Arises from chair without difficulty. Stance is slightly broad-based Gait demonstrates favoring of the right hip due to pain and slight dragging of the left leg.. Unable to heel, toe and tandem walk without difficulty.   Reflexes: 1+ and symmetric. Toes downgoing.   NIHSS  0 Modified Rankin  2   ASSESSMENT: 89 year Caucasian lady with five-month history of gait and balance difficulties likely due to right pontine and cerebellar infarcts etiology likely small vessel disease. Multiple vascular risk factors of hypertension, hyperlipidemia, h/o sleep apnoea  .new left thalamic infarct in June 2018 with mild right lip and hand paresthesias. New lower extremity deep vein thrombosis in December 2019 following  hip surgery PLAN: I had a long discussion with the patient and her husband regarding her recent diagnosis of deep vein thrombosis and need to switch Plavix to Xarelto.  She will stay on Xarelto for 6 to 9 months and once it is discontinued she may go back on Plavix for  stroke prevention.  Continue strict control of hypertension with blood pressure goal below 130/90, lipids with LDL cholesterol goal below 70 mg percent and diabetes with hemoglobin A1c goal below 6.5%.  She was encouraged to use a cane at all times for fall and safety precautions.  She will return for follow-up in the future only as necessary.Greater than 50% time during this 30 minute visit was spent on counseling and coordination of care for her lacunar stroke and DVTt Followup in the future with stroke nurse practitioner in 3 months or call earlier if necessary.  Delia Heady, MD Note: This document was prepared with digital dictation and possible smart phrase technology. Any transcriptional errors that result from this process are unintentional.

## 2018-11-02 ENCOUNTER — Other Ambulatory Visit: Payer: Self-pay | Admitting: Family Medicine

## 2018-11-20 ENCOUNTER — Ambulatory Visit: Payer: Medicare Other

## 2018-11-27 ENCOUNTER — Encounter: Payer: Self-pay | Admitting: Family Medicine

## 2018-11-27 ENCOUNTER — Ambulatory Visit: Payer: Medicare Other | Admitting: Family Medicine

## 2018-11-27 VITALS — BP 140/72 | HR 57 | Temp 98.8°F | Ht 65.5 in | Wt 175.2 lb

## 2018-11-27 DIAGNOSIS — I82411 Acute embolism and thrombosis of right femoral vein: Secondary | ICD-10-CM

## 2018-11-27 MED ORDER — VERAPAMIL HCL ER 120 MG PO TBCR: 120.0000 mg | EXTENDED_RELEASE_TABLET | Freq: Every day | ORAL | 11 refills | Status: DC

## 2018-11-27 MED ORDER — ATORVASTATIN CALCIUM 40 MG PO TABS: 40.0000 mg | ORAL_TABLET | Freq: Every day | ORAL | 11 refills | Status: DC

## 2018-11-27 MED ORDER — TRAMADOL HCL 50 MG PO TABS: 100.0000 mg | ORAL_TABLET | Freq: Four times a day (QID) | ORAL | 5 refills | Status: DC | PRN

## 2018-11-27 NOTE — Progress Notes (Signed)
   Subjective:    Patient ID: Abigail [name], female    DOB: [birth year], 73 y.o.   MRN: [id number]  HPI Here to follow up a right leg DVT that was diagnosed on 09-05-18. She has been on Xarelto since then. She has no pain or swelling in the right leg now. No chest pain or SOB.    Review of Systems  Constitutional: Negative.   Respiratory: Negative.   Cardiovascular: Negative.   Musculoskeletal: Negative.        Objective:   Physical Exam Constitutional:      Appearance: Normal appearance.     Comments: Walks with a cane   Cardiovascular:     Rate and Rhythm: Normal rate and regular rhythm.     Pulses: Normal pulses.     Heart sounds: Normal heart sounds.  Pulmonary:     Effort: Pulmonary effort is normal.     Breath sounds: Normal breath sounds.  Musculoskeletal:        General: No swelling or tenderness.     Right lower leg: No edema.     Left lower leg: No edema.  Neurological:     Mental Status: She is alert.           Assessment & Plan:  Right leg DVT. We will set up a venous doppler soon. If this is clear we will stop Xarelto and put her back on Plavix. Gershon Crane, MD

## 2018-11-28 ENCOUNTER — Ambulatory Visit
Admission: RE | Admit: 2018-11-28 | Discharge: 2018-11-28 | Disposition: A | Discharge status: Home / Self Care | Payer: Medicare Other | Source: Ambulatory Visit | Attending: Family Medicine | Admitting: Family Medicine

## 2018-11-28 ENCOUNTER — Other Ambulatory Visit: Payer: Self-pay

## 2018-11-28 DIAGNOSIS — Z1231 Encounter for screening mammogram for malignant neoplasm of breast: Secondary | ICD-10-CM

## 2018-11-29 ENCOUNTER — Ambulatory Visit (HOSPITAL_COMMUNITY)
Admission: RE | Admit: 2018-11-29 | Discharge: 2018-11-29 | Disposition: A | Discharge status: Home / Self Care | Payer: Medicare Other | Source: Ambulatory Visit | Attending: Cardiovascular Disease | Admitting: Cardiovascular Disease

## 2018-11-29 DIAGNOSIS — I82411 Acute embolism and thrombosis of right femoral vein: Secondary | ICD-10-CM | POA: Diagnosis not present

## 2018-12-03 ENCOUNTER — Telehealth: Payer: Self-pay | Admitting: Family Medicine

## 2018-12-03 MED ORDER — RIVAROXABAN 20 MG PO TABS: 20.0000 mg | ORAL_TABLET | Freq: Every day | ORAL | 2 refills | Status: DC

## 2018-12-03 NOTE — Telephone Encounter (Signed)
Shows a chronic DVT in the right leg which has partially resolved (it is smaller than before). She should take the Xarelto for 90 more days and then we can get another doppler   I have sent the xarelto to the pharmacy and I have called and LMOMTCB x 1

## 2018-12-03 NOTE — Telephone Encounter (Signed)
Copied from CRM [id number]. Topic: Quick Communication - See Telephone Encounter >> Dec 03, 2018  8:10 AM Aretta Nip wrote: CRM for notification. See Telephone encounter for: 12/03/18.rivaroxaban (XARELTO) 20 MG TABS tablet Needs a call back from Dr Claris Che nurse. Dr Clent Ridges had told her not to refill this if she no lnoger had clots following doppler ultrasound, She heard back on Friday that there still is one more clot. She wants to know if she should continue the drug or stop.

## 2018-12-03 NOTE — Telephone Encounter (Signed)
Dr. Clent Ridges please advise on the xarelto.  Thanks

## 2018-12-03 NOTE — Telephone Encounter (Signed)
Pt calling to check status  °

## 2018-12-03 NOTE — Telephone Encounter (Signed)
TC from patient. Reviewed Dr.Fry's result note and medication recommendation with patient. Stated she understood and will call in 90 days for the doppler study.

## 2018-12-13 ENCOUNTER — Telehealth: Payer: Self-pay | Admitting: Family Medicine

## 2018-12-13 NOTE — Telephone Encounter (Signed)
Pt checking status on medication being ordered.

## 2018-12-13 NOTE — Telephone Encounter (Signed)
Copied from CRM [id number]. Topic: Quick Communication - See Telephone Encounter >> Dec 13, 2018  8:39 AM Maia Petties wrote: CRM for notification. See Telephone encounter for: 12/13/18.  Pt called stating she is having a recurrence of diverticulitis. She said it started on Tuesday. She had a smoothie with strawberries and blackberries. Her stomach started hurting. Thru Tuesday night she started having vomiting and diarrhea with 1 episode of incontinence. She said this continued thru Wednesday. She has tried total of 5 anti-diarrhea medication. She said the diarrhea and vomiting have stopped but her stomach is so sore she can't even stand to touch it. Pt requesting cipro and flagyl that she states she has been prescribed before by Dr. Clent Ridges for diverticulitis. She states she has been in the bed since yesterday. Please advise.  Pt is accepting of a telephone visit if necessary. Ph# [phone]  ciprofloxacin (CIPRO) 500 MG tablet metroNIDAZOLE (FLAGYL) 500 MG tablet  [id number] [place], [place] - [address] AT [facility] [phone] (Phone) [phone] (Fax)

## 2018-12-14 MED ORDER — CIPROFLOXACIN HCL 500 MG PO TABS: 500.0000 mg | ORAL_TABLET | Freq: Two times a day (BID) | ORAL | 0 refills | Status: DC

## 2018-12-14 MED ORDER — METRONIDAZOLE 500 MG PO TABS: 500.0000 mg | ORAL_TABLET | Freq: Three times a day (TID) | ORAL | 0 refills | Status: DC

## 2018-12-14 NOTE — Telephone Encounter (Signed)
Pt calling back this am.  Pt states she was told Dr Clent Ridges was going to call her, 3 separate times, and she is very upset. Pt states she is sitting there suffering.  Pt states Dr Clent Ridges always gives her the Cipro and Flagyl, because Dr Clent Ridges aware of her on going episodes of diverticulitis. Pt would like a Rx sent to the pharmacy, or a call back please.

## 2018-12-14 NOTE — Telephone Encounter (Signed)
Message forwarded to As[place]yn as she spoke with someone at the [facility] regarding the pt.

## 2018-12-14 NOTE — Telephone Encounter (Signed)
Rx sent in per Dr Clent Ridges request.  Pt aware.  Nothing further needed.

## 2018-12-14 NOTE — Telephone Encounter (Signed)
Pt is wanting to either have something sent directly to the pharmacy or Dr Clent Ridges call her to do a visit via the telephone. Pt declined virtual visit - no one to help her at home to get this set up.   [id number] - [place], [place] - [address] AT [facility]

## 2018-12-14 NOTE — Telephone Encounter (Signed)
Call in Flagyl 500 mg TID for 10 days and Cipro 500 mg BID for 10 days  

## 2019-01-16 ENCOUNTER — Telehealth: Payer: Self-pay | Admitting: Family Medicine

## 2019-01-16 NOTE — Telephone Encounter (Signed)
Dr. Clent Ridges please advise on when the pt will need to be seen again.

## 2019-01-16 NOTE — Telephone Encounter (Signed)
Her 6 months will up on 03-07-19, we can plan on seeing her in the middle of June to do this

## 2019-01-16 NOTE — Telephone Encounter (Signed)
Copied from CRM [id number]. Topic: General - Other >> Jan 16, 2019 11:58 AM Herby Abraham C wrote: Reason for CRM: pt says that she had a blood clot in her leg and was prescribed rivaroxaban (XARELTO) 20 MG TABS tablet . Pt says that she was advised to take medication for 6 months and then she would be re-evaluated . Pt would like to be advised on when should she be seen? Please advise    CB: [phone]

## 2019-01-17 NOTE — Telephone Encounter (Signed)
Called and spoke with pt and she is aware of appt scheduled with Dr. Clent Ridges on 6/18 at 10

## 2019-02-16 ENCOUNTER — Other Ambulatory Visit: Payer: Self-pay | Admitting: Family Medicine

## 2019-02-19 NOTE — Telephone Encounter (Signed)
Pt needs sooner appt than this, she is having injection in her back this day.  Please call to reschedule. Pt called 7:30 am to change.

## 2019-02-25 ENCOUNTER — Ambulatory Visit: Payer: Medicare Other | Admitting: Family Medicine

## 2019-02-25 ENCOUNTER — Other Ambulatory Visit: Payer: Self-pay

## 2019-02-25 ENCOUNTER — Encounter: Payer: Self-pay | Admitting: Family Medicine

## 2019-02-25 VITALS — BP 110/72 | HR 65 | Temp 98.3°F | Wt 181.4 lb

## 2019-02-25 DIAGNOSIS — G8929 Other chronic pain: Secondary | ICD-10-CM

## 2019-02-25 DIAGNOSIS — M5440 Lumbago with sciatica, unspecified side: Secondary | ICD-10-CM

## 2019-02-25 DIAGNOSIS — I824Z1 Acute embolism and thrombosis of unspecified deep veins of right distal lower extremity: Secondary | ICD-10-CM

## 2019-02-25 NOTE — Progress Notes (Signed)
   Subjective:    Patient ID: Abigail [name], female    DOB: 24-Dec-1945, 73 y.o.   MRN: [id number]  HPI Here to follow up on a right leg DVT that was diagnosed on 09-06-18. She has been on Xarelto since then. She had a 3 month US performed which showed partial resolution of the thrombus, so we agreed to use Xarelto another 90 days. She has been seeing Dr. Rhona Raider and Dr. Jacelyn Grip for chronic low back pain and right hip pain, and they are considering her for epidural steroid shots. Of course she would need to be off Xarelto before this could be contemplated. She has pain in the right upper and lower leg areas. No swelling in the feet or ankles. No chest pain or SOB.    Review of Systems  Constitutional: Negative.   Respiratory: Negative.   Cardiovascular: Negative.   Musculoskeletal: Positive for arthralgias and back pain.       Objective:   Physical Exam Constitutional:      Appearance: Normal appearance.     Comments: Walks with a cane   Cardiovascular:     Rate and Rhythm: Normal rate and regular rhythm.     Pulses: Normal pulses.     Heart sounds: Normal heart sounds.  Pulmonary:     Effort: Pulmonary effort is normal.     Breath sounds: Normal breath sounds.  Musculoskeletal:     Right lower leg: No edema.     Left lower leg: No edema.     Comments: She is tender in the right calf and right thigh. No cords are felt. Bevelyn Buckles is negative.  Neurological:     Mental Status: She is alert.           Assessment & Plan:  Right leg DVT. We are now at the 6 month interval, so we will set up another venous doppler to evaulate the status of the thrombus. If we can in fact stop the Xarelto, we will start her back on Plavix due to the hx of stroke. If we can do this, then she and Dr. Jacelyn Grip can proceed with back injections of their choice. Alysia Penna, MD

## 2019-03-05 ENCOUNTER — Other Ambulatory Visit: Payer: Self-pay

## 2019-03-05 ENCOUNTER — Ambulatory Visit (HOSPITAL_COMMUNITY)
Admission: RE | Admit: 2019-03-05 | Discharge: 2019-03-05 | Disposition: A | Discharge status: Home / Self Care | Payer: Medicare Other | Source: Ambulatory Visit | Attending: Cardiovascular Disease | Admitting: Cardiovascular Disease

## 2019-03-05 ENCOUNTER — Telehealth: Payer: Self-pay | Admitting: Family Medicine

## 2019-03-05 DIAGNOSIS — I824Z1 Acute embolism and thrombosis of unspecified deep veins of right distal lower extremity: Secondary | ICD-10-CM | POA: Insufficient documentation

## 2019-03-05 NOTE — Telephone Encounter (Signed)
Dr. Fry please advise. Thanks  

## 2019-03-05 NOTE — Telephone Encounter (Signed)
Pt called and stated that she has a blood clot. Pt states that this is the third time that she has had imaging done and Blood clot has not went away.please advise

## 2019-03-06 ENCOUNTER — Ambulatory Visit (INDEPENDENT_AMBULATORY_CARE_PROVIDER_SITE_OTHER): Payer: Medicare Other | Admitting: Family Medicine

## 2019-03-06 ENCOUNTER — Encounter: Payer: Self-pay | Admitting: Family Medicine

## 2019-03-06 DIAGNOSIS — I639 Cerebral infarction, unspecified: Secondary | ICD-10-CM | POA: Diagnosis not present

## 2019-03-06 DIAGNOSIS — R200 Anesthesia of skin: Secondary | ICD-10-CM

## 2019-03-06 DIAGNOSIS — I824Z1 Acute embolism and thrombosis of unspecified deep veins of right distal lower extremity: Secondary | ICD-10-CM | POA: Diagnosis not present

## 2019-03-06 DIAGNOSIS — R202 Paresthesia of skin: Secondary | ICD-10-CM

## 2019-03-06 NOTE — Progress Notes (Signed)
Subjective:    Patient ID: Abigail [name], female    DOB: 04-14-1946, 73 y.o.   MRN: [id number]  HPI Virtual Visit via Video Note  I connected with the patient on 03/06/19 at 11:30 AM EDT by a video enabled telemedicine application and verified that I am speaking with the correct person using two identifiers.  Location patient: home Location provider:work or home office Persons participating in the virtual visit: patient, provider  I discussed the limitations of evaluation and management by telemedicine and the availability of in person appointments. The patient expressed understanding and agreed to proceed.   HPI: Here to discuss her recent doppler on the right leg to follow up a DVT. She has been on Xarelto for 6 months. The recent doppler revealed a small chronic thrombus in the femoral vein that is stable, but all acute thrombus has resolved. Her leg feels fine. Our plan was to use Xarelto for 6-9 mionths and then switch back to Plavix. She notes that the numbness in the right hand that started during her stroke in June 2018 has been progressing a bit lately. No other neurologic changes. She last saw Dr. Leonie Man in February.    ROS: See pertinent positives and negatives per HPI.  Past Medical History:  Diagnosis Date  . Bronchitis 09/2016  . Bruises easily   . DDD (degenerative disc disease)   . Degenerative joint disease of cervical spine 09-05-11   Cervival area, now some osteoarthritis-lower back and Rt. shoulder  . Depression   . Diverticulitis 09-05-11   hx. gastritis, diverticulitis x2 -now surgery planned  . DIVERTICULITIS, ACUTE 02/10/2010  . Dyspnea    with exertion  . Essential hypertension 09-05-11   tx. Verapamil  . GROIN PAIN 09/21/2010  . Headache(784.0)    headaches are better  . Hearing loss    right ear  . Heart palpitations   . Hemorrhoids   . History of bronchitis   . History of colonic polyps   . History of pneumonia   . History of tension headache   .  Hyperlipidemia   . Hypertension   . Hypothyroidism 09-05-11   Supplement used  . Late effect of adverse effect of drug, medicinal or biological substance   . NECK PAIN, ACUTE 09/21/2010  . Neuromuscular disorder ([place])    carpal tunnel in both hands  . Nocturia   . Osteoarthritis 112-17-12   spine and rt. hip, rt. shoulder  . Patent foramen ovale    Small - unable to be closed -   . Pneumonia   . PONV (postoperative nausea and vomiting)   . Sleep apnea 09-05-11   no cpap ever, had surgery to remove cartilage, no problems now  . SORE THROAT 04/30/2009  . Stroke ([place]) 09-05-11   2006/2009-(loss of memory, balance issues remains occ.)  . TIA 09/28/2007, 10/2014  . Unstable angina ([place])    a. 05/2010 Cath: nl cors, EF 55%;  b. 05/2015 Lexiscan MV: small, severe, fixed apical defect and a small, severe, reversible inf lateral defect w/ apical thinning and mild ischemia, EF 54%.  Marland Kitchen URI 09/02/2008  . Vertigo     Past Surgical History:  Procedure Laterality Date  . ABDOMINAL HYSTERECTOMY    . BACK SURGERY    . CARDIAC CATHETERIZATION N/A 06/01/2015   Procedure: Left Heart Cath and Coronary Angiography;  Surgeon: Burnell Blanks, MD;  Location: [facility];  Service: Cardiovascular;  Laterality: N/A;  . COLON RESECTION  09/07/2011  Procedure: LAPAROSCOPIC SIGMOID COLON RESECTION;  Surgeon: Mariella SaaBenjamin T Hoxworth, MD;  Location: [facility];  Service: General;  Laterality: N/A;  with proctoscopy  . COLONOSCOPY  08/06/2015   per Dr. Kinnie ScalesMedoff, adenomatous polyps, repeat in 5 yrs   . CYST EXCISION N/A 11/29/2016   Procedure: EXCISION UMBILICAL CYST;  Surgeon: Glenna FellowsBenjamin Hoxworth, MD;  Location: [facility];  Service: General;  Laterality: N/A;  . ELBOW SURGERY  09-05-11   left elbow -ligament repair  . EYE SURGERY     cataract  . INSERTION OF MESH N/A 12/13/2013   Procedure: INSERTION OF MESH;  Surgeon: Mariella SaaBenjamin T Hoxworth, MD;  Location: [facility];  Service: General;  Laterality: N/A;  .  LAPAROSCOPY  09/07/2011   Procedure: LAPAROSCOPY DIAGNOSTIC;  Surgeon: Roselle LocusJames E Tomblin II;  Location: [facility];  Service: Gynecology;  Laterality: N/A;  . MAXIMUM ACCESS (MAS)POSTERIOR LUMBAR INTERBODY FUSION (PLIF) 2 LEVEL N/A 05/03/2016   Procedure: L4-5 L5-S1 Maximum access posterior lumbar interbody fusion;  Surgeon: Maeola HarmanJoseph Stern, MD;  Location: [facility];  Service: Neurosurgery;  Laterality: N/A;  L4-5 L5-S1 Maximum access posterior lumbar interbody fusion  . SALPINGOOPHORECTOMY  09/07/2011   Procedure: SALPINGO OOPHERECTOMY;  Surgeon: Roselle LocusJames E Tomblin II;  Location: [facility];  Service: Gynecology;  Laterality: Right;  . THYROIDECTOMY, PARTIAL  09-05-11  . TOTAL HIP ARTHROPLASTY Right 08/21/2018   Procedure: TOTAL HIP ARTHROPLASTY ANTERIOR APPROACH;  Surgeon: Marcene Corningalldorf, Peter, MD;  Location: [facility];  Service: Orthopedics;  Laterality: Right;  . TUBAL LIGATION  1983  . UMBILICAL HERNIA REPAIR  yrs ago  . VENTRAL HERNIA REPAIR  06/20/2012   Procedure: LAPAROSCOPIC VENTRAL HERNIA;  Surgeon: Mariella SaaBenjamin T Hoxworth, MD;  Location: [facility];  Service: General;  Laterality: N/A;  Laparoscopic Repair of Ventral Hernia with mesh  . VENTRAL HERNIA REPAIR N/A 12/13/2013   Procedure: LAPAROSCOPIC REPAIR RECURRENT VENTRAL INCISIONAL  HERNIA;  Surgeon: Mariella SaaBenjamin T Hoxworth, MD;  Location: [facility];  Service: General;  Laterality: N/A;    Family History  Problem Relation Age of Onset  . Arrhythmia Mother   . Hypertension Mother   . Stroke Mother   . Stroke Maternal Grandmother   . Diabetes Paternal Grandmother   . Breast cancer Sister        7950s  . Breast cancer Sister        2660s     Current Outpatient Medications:  .  atorvastatin (LIPITOR) 40 MG tablet, Take 1 tablet (40 mg total) by mouth daily., Disp: 30 tablet, Rfl: 11 .  butalbital-acetaminophen-caffeine (FIORICET, ESGIC) 50-325-40 MG tablet, TAKE 1 TABLET BY MOUTH EVERY 6 HOURS AS NEEDED FOR HEADACHE, Disp: 60 tablet, Rfl: 5 .  ciprofloxacin (CIPRO)  500 MG tablet, Take 1 tablet (500 mg total) by mouth 2 (two) times daily., Disp: 20 tablet, Rfl: 0 .  levothyroxine (SYNTHROID, LEVOTHROID) 125 MCG tablet, Take 1 tablet (125 mcg total) by mouth daily before breakfast. TAKE 1 TABLET BY MOUTH EVERY DAY BEFORE BREAKFAST, Disp: 90 tablet, Rfl: 1 .  losartan (COZAAR) 50 MG tablet, Take 1 tablet (50 mg total) by mouth daily., Disp: 30 tablet, Rfl: 3 .  Menthol, Topical Analgesic, (ASPERCREME MAX ROLL-ON EX), Apply 1 application topically daily as needed (back pain)., Disp: , Rfl:  .  metroNIDAZOLE (FLAGYL) 500 MG tablet, Take 1 tablet (500 mg total) by mouth 3 (three) times daily., Disp: 21 tablet, Rfl: 0 .  potassium chloride (K-DUR) 10 MEQ tablet, Take 1 tablet (10 mEq total) by mouth daily.,  Disp: 30 tablet, Rfl: 5 .  tetrahydrozoline 0.05 % ophthalmic solution, Place 1 drop into both eyes 4 (four) times daily as needed (for dry/irritated eyes.)., Disp: , Rfl:  .  traMADol (ULTRAM) 50 MG tablet, Take 2 tablets (100 mg total) by mouth every 6 (six) hours as needed for moderate pain or severe pain. TAKE 1 TO 2 TABLETS BY MOUTH EVERY 4 TO 6 HOURS AS NEEDED FOR PAIN, Disp: 120 tablet, Rfl: 5 .  verapamil (CALAN-SR) 120 MG CR tablet, Take 1 tablet (120 mg total) by mouth at bedtime., Disp: 30 tablet, Rfl: 11 .  XARELTO 20 MG TABS tablet, TAKE 1 TABLET BY MOUTH DAILY WITH SUPPER, Disp: 30 tablet, Rfl: 2  EXAM:  VITALS per patient if applicable:  GENERAL: alert, oriented, appears well and in no acute distress  HEENT: atraumatic, conjunttiva clear, no obvious abnormalities on inspection of external nose and ears  NECK: normal movements of the head and neck  LUNGS: on inspection no signs of respiratory distress, breathing rate appears normal, no obvious gross SOB, gasping or wheezing  CV: no obvious cyanosis  MS: moves all visible extremities without noticeable abnormality  PSYCH/NEURO: pleasant and cooperative, no obvious depression or anxiety,  speech and thought processing grossly intact  ASSESSMENT AND PLAN: Right leg DVT, this is now stable. We will send in Plavix 75 mg to take daily. I asked her to bridge with Xarelto by taking both for 3 days, and then to stop the Xarelto. We will arrange for her to see Dr. Pearlean BrownieSethi soon for the numbness in there hand.  Gershon CraneStephen Fry, MD  Discussed the following assessment and plan:  No diagnosis found.     I discussed the assessment and treatment plan with the patient. The patient was provided an opportunity to ask questions and all were answered. The patient agreed with the plan and demonstrated an understanding of the instructions.   The patient was advised to call back or seek an in-person evaluation if the symptoms worsen or if the condition fails to improve as anticipated.     Review of Systems     Objective:   Physical Exam        Assessment & Plan:

## 2019-03-07 ENCOUNTER — Ambulatory Visit: Payer: Medicare Other | Admitting: Family Medicine

## 2019-03-07 ENCOUNTER — Other Ambulatory Visit: Payer: Self-pay | Admitting: Family Medicine

## 2019-03-08 ENCOUNTER — Other Ambulatory Visit: Payer: Self-pay | Admitting: Family Medicine

## 2019-03-12 ENCOUNTER — Telehealth: Payer: Self-pay | Admitting: *Deleted

## 2019-03-12 NOTE — Telephone Encounter (Signed)
Patient was transferred to Clinic RN and was upset that medication had not been called in. Clinic RN called in Plavix 75 mg daily#90 with 3 refills to [facility] in [place]. Clinic RN notified patient that this has been called in. Patient verbalized understanding.

## 2019-03-12 NOTE — Telephone Encounter (Signed)
Copied from [facility] [id number]. Topic: General - Other >> Mar 11, 2019 10:04 AM Carolyn Stare wrote:  Pt call to say Dr Sarajane Jews was suppose call in Plavix for her and she has contacted her pharmacy and they don't have a RX  Dr. Sarajane Jews please advise on plavix and is she supposed to stay on the [place]?   Thanks

## 2019-03-12 NOTE — Telephone Encounter (Signed)
Call in Plavix 75 mg daily, #90 with 3 rf. She should take a Xarelto along with it for 3 days and then stop the Xarelto

## 2019-03-19 ENCOUNTER — Other Ambulatory Visit: Payer: Self-pay | Admitting: Family Medicine

## 2019-04-01 ENCOUNTER — Encounter: Payer: Self-pay | Admitting: Neurology

## 2019-04-01 ENCOUNTER — Ambulatory Visit: Payer: Medicare Other | Admitting: Neurology

## 2019-04-01 ENCOUNTER — Other Ambulatory Visit: Payer: Self-pay

## 2019-04-01 VITALS — BP 140/78 | HR 53 | Temp 97.5°F | Wt 183.0 lb

## 2019-04-01 DIAGNOSIS — R1310 Dysphagia, unspecified: Secondary | ICD-10-CM

## 2019-04-01 DIAGNOSIS — I6990 Unspecified sequelae of unspecified cerebrovascular disease: Secondary | ICD-10-CM | POA: Diagnosis not present

## 2019-04-01 DIAGNOSIS — I6529 Occlusion and stenosis of unspecified carotid artery: Secondary | ICD-10-CM

## 2019-04-01 NOTE — Patient Instructions (Signed)
I had a long discussion with the patient about her dysphagia nd residual right little finger paresthesias which may be residue from her stroke.  Continue Plavix for stroke prevention and strict control of hypertension with blood pressure goal below 130/90, lipids with LDL cholesterol goal below 70 mg percent and diabetes with hemoglobin A1c goal below 6.5%.  She was encouraged to use a cane at all times for fall and safety precautions.  She will return for follow-up in the future only as necessary.Greater than 50% time during this 30 minute visit was spent on counseling and coordination of care for her lacunar stroke and dysphagia. Refer to speech therapy for dysphagia evaluation and f/u screening carotid dopplers study.ollowup in the future with stroke nurse practitioner in 6 months or call earlier if necessary.

## 2019-04-01 NOTE — Progress Notes (Signed)
[facility] [address] [place] [id number] [phone]       OFFICE FOLLOW UP VISIT NOTE  Abigail. Abigail [name] Date of Birth:  10/21/1945 Medical Record Number:  [id number]   Referring MD:  Abigail LowPeter [name]  Reason for Referral:  Dizziness and abnormal MRI brain  HPI: Initial Consult 06/03/2015 :  Abigail [name] is a [age] year pleasant lady whose had five-month history of gait and balance difficulties. She just feels she often leans to the left and stumbles if she is not careful. She feels the symptoms began suddenly one day when she noticed trouble with balance and walking she also had some trouble swallowing but she did not seek immediate help at that time. She does have a prior history of her left thalamic lacunar infarct in 2007 for which she had seen me. That was felt to be due to small vessel disease and she was started on Aggrenox which did not tolerate due to side effects. She was lost to medical follow-up to me. She is recently had an outpatient MRI scan of the brain which have personally reviewed on 05/02/15 which does show a right pontine and cerebellar hyperintensity which is likely a age indeterminate lacunar infarcts which were not noticed on the previous MRI and perhaps may be the explanation of her recent dizziness and gait difficulties. The patient is currently on Plavix which is tolerating well without bleeding or bruising. She states her blood pressure has been difficult to control and today it is elevated at 171/97 in office. She did also had some recent chest pain or shortness of breath for which she underwent cardiac catheterization last Monday which did not show any correctable lesion. Dr. Jens [name] for cardiologist has also added a baby aspirin and she seems to be having increased bruising tendency since then. She's not sure when her last lipid profile was checked but she does take Lipitor 40 mg daily which is tolerating well without side effects. She does have  history of sleep apnea and had surgery for that and does not use CPAP Update 11/17/15 : She returns for follow-up after last visit 5 months ago. She is accompanied by husband. Patient states she was doing fine until a few weeks ago since then she's noticed intermittent tingling in the left hand as well as weakness and dropping of objects from that hand. She's also noticed some walking difficulty and having to drag her left leg. She denies any slurred speech, headache, fall or injury. She was seen by me in September of last year and at that time MRI scan of the brain prior to that visit had shown small remote age lacunar infarcts in the right cerebellum and pons. I had ordered MRA of the neck and brain which have personally reviewed done on 06/03/15 which showed no significant intra-or extracranial stenosis. Lab work on 06/03/15 showed LDL cholesterol 84 but HDL of 77 with the ratio of 2.3. Hemoglobin A1c was borderline at 6.0. Patient has been on Plavix since 2006 following her previous stroke. She was unable to tolerate Aggrenox at that time. Patient states she is having gradual worsening of her balance and walking since spring of last year and perhaps had a small stroke at that time which was not recognized. She states she is tolerating Plavix well without bleeding or bruising. Her blood pressure is well controlled and today it is 139/76. The patient is refusing a referral to physical outpatient therapy but she wants to go and participate  in the [facility] program at the [facility]. She is tolerating Lipitor without myalgias or arthralgias. She does use a CPAP for sleep apnea. Update 05/19/2017 ; she returns for follow-up after last visit for year and a half ago. She however was admitted to the hospital in June 2018 with a right lip and hand paresthesias. MRI scan did show a small left thalamic lacunar infarct. MRA of the brain was unremarkable. Carotid Doppler showed no significant extracranial stenosis.  Transthoracic echo showed normal ejection fraction. LDL cholesterol was borderline at 72 mg percent. Hemoglobin A1c was 6.1. Patient was started on aspirin 81 and Plavix 75 mg per 3 weeks and since then she has discontinued aspirin and is currently on Plavix alone. She is tolerating well without bruising or bleeding. She states her right lip numbness has resolved but right little finger in [place] persists. She has seen Dr. Vertell [name] for back pain and he has discontinued both) and replace it with Aleve and tramadol. Patient also complains of subjective mild memory difficulties since her stroke. She has trouble remembering recent information  and names. Update 10/31/2018 : She returns for follow-up after last visit a year and half ago.  She is accompanied by her husband.  Patient is doing well from stroke standpoint without recurrent stroke or TIA symptoms.  She states she had hip replacement surgery in December 2019 and a few days later developed leg pain and swelling and was diagnosed as femoral vein deep vein thrombosis.  She has since stopped Plavix and has been switched to Xarelto which she seems to be tolerating well without bleeding or bruising.  She states her blood pressure is under good control though today it is slightly elevated at 1 four 8/83.  She is tolerating Lipitor well without any side effects.  Her last lipid profile was satisfactory.  She has no new neurological complaints.  She is walking with a cane and has had no falls or injuries.  She states her mild subjective memory difficulties are unchanged and are not getting worse Update 04/01/2019 : She returns for follow-up after last visit 5 months ago.  She has not had any recurrent stroke or TIA symptoms.  She continues to have back pain and hip pain and walking difficulties mostly related to her bad hip.  She had hip replacement surgery in December but has had persistent pain.  She recently underwent epidural back injection by orthopedic surgeon  without significant relief.  She complains of difficulty swallowing for the last 1 month which is intermittent and occurs once or twice a week.  This is mainly when she is trying to swallow liquids.  She initially has trouble initiating it but once she can swallow the fluid goes down well.  She denies any nasal regurgitation coughing or gagging.  She is tolerating Plavix well with only minor bruising and no bleeding.  Her blood pressures well controlled and today it is 140/78.  She remains on Lipitor which is tolerating well and states lipid profile was checked only last year was satisfactory.  She has not had follow-up carotid ultrasound and for more than a year.   ROS:   14 system review of systems is positive for joint pain,  hip pain, difficulty walking , right hand numbness and tingling and all other systems negative  and all other systems negative  PMH:  Past Medical History:  Diagnosis Date   Bronchitis 09/2016   Bruises easily    DDD (degenerative disc disease)  Degenerative joint disease of cervical spine 09-05-11   Cervival area, now some osteoarthritis-lower back and Rt. shoulder   Depression    Diverticulitis 09-05-11   hx. gastritis, diverticulitis x2 -now surgery planned   DIVERTICULITIS, ACUTE 02/10/2010   Dyspnea    with exertion   Essential hypertension 09-05-11   tx. Verapamil   GROIN PAIN 09/21/2010   Headache(784.0)    headaches are better   Hearing loss    right ear   Heart palpitations    Hemorrhoids    History of bronchitis    History of colonic polyps    History of pneumonia    History of tension headache    Hyperlipidemia    Hypertension    Hypothyroidism 09-05-11   Supplement used   Late effect of adverse effect of drug, medicinal or biological substance    NECK PAIN, ACUTE 09/21/2010   Neuromuscular disorder (HCC)    carpal tunnel in both hands   Nocturia    Osteoarthritis 112-17-12   spine and rt. hip, rt. shoulder   Patent  foramen ovale    Small - unable to be closed -    Pneumonia    PONV (postoperative nausea and vomiting)    Sleep apnea 09-05-11   no cpap ever, had surgery to remove cartilage, no problems now   SORE THROAT 04/30/2009   Stroke (HCC) 09-05-11   2006/2009-(loss of memory, balance issues remains occ.)   TIA 09/28/2007, 10/2014   Unstable angina (HCC)    a. 05/2010 Cath: nl cors, EF 55%;  b. 05/2015 Lexiscan MV: small, severe, fixed apical defect and a small, severe, reversible inf lateral defect w/ apical thinning and mild ischemia, EF 54%.   URI 09/02/2008   Vertigo     Social History:  Social History   Socioeconomic History   Marital status: Married    Spouse name: Not on file   Number of children: Not on file   Years of education: Not on file   Highest education level: Not on file  Occupational History   Not on file  Social Needs   Financial resource strain: Not on file   Food insecurity    Worry: Not on file    Inability: Not on file   Transportation needs    Medical: Not on file    Non-medical: Not on file  Tobacco Use   Smoking status: Former Smoker    Packs/day: 0.50    Years: 5.00    Pack years: 2.50    Types: Cigarettes    Quit date: 09/19/1968    Years since quitting: 50.5   Smokeless tobacco: Never Used  Substance and Sexual Activity   Alcohol use: Yes    Alcohol/week: 2.0 standard drinks    Types: 1 Cans of beer, 1 Shots of liquor per week    Comment: rarely   Drug use: No   Sexual activity: Yes  Lifestyle   Physical activity    Days per week: Not on file    Minutes per session: Not on file   Stress: Not on file  Relationships   Social connections    Talks on phone: Not on file    Gets together: Not on file    Attends religious service: Not on file    Active member of club or organization: Not on file    Attends meetings of clubs or organizations: Not on file    Relationship status: Not on file   Intimate partner violence  Fear of current or ex partner: Not on file    Emotionally abused: Not on file    Physically abused: Not on file    Forced sexual activity: Not on file  Other Topics Concern   Not on file  Social History Narrative   Lives in [place] with her husband.  She does not routinely exercise.    Medications:   Current Outpatient Medications on File Prior to Visit  Medication Sig Dispense Refill   atorvastatin (LIPITOR) 40 MG tablet Take 1 tablet (40 mg total) by mouth daily. 30 tablet 11   butalbital-acetaminophen-caffeine (FIORICET, ESGIC) 50-325-40 MG tablet TAKE 1 TABLET BY MOUTH EVERY 6 HOURS AS NEEDED FOR HEADACHE 60 tablet 5   clopidogrel (PLAVIX) 75 MG tablet Take 75 mg by mouth daily.     levothyroxine (SYNTHROID) 125 MCG tablet TAKE 1 TABLET (125 MCG TOTAL) BY MOUTH DAILY BEFORE BREAKFAST. 90 tablet 1   losartan (COZAAR) 50 MG tablet TAKE 1 TABLET BY MOUTH EVERY DAY 30 tablet 3   Menthol, Topical Analgesic, (ASPERCREME MAX ROLL-ON EX) Apply 1 application topically daily as needed (back pain).     metroNIDAZOLE (FLAGYL) 500 MG tablet Take 1 tablet (500 mg total) by mouth 3 (three) times daily. 21 tablet 0   potassium chloride (K-DUR) 10 MEQ tablet Take 1 tablet (10 mEq total) by mouth daily. 30 tablet 5   tetrahydrozoline 0.05 % ophthalmic solution Place 1 drop into both eyes 4 (four) times daily as needed (for dry/irritated eyes.).     traMADol (ULTRAM) 50 MG tablet Take 2 tablets (100 mg total) by mouth every 6 (six) hours as needed for moderate pain or severe pain. TAKE 1 TO 2 TABLETS BY MOUTH EVERY 4 TO 6 HOURS AS NEEDED FOR PAIN 120 tablet 5   verapamil (CALAN-SR) 120 MG CR tablet Take 1 tablet (120 mg total) by mouth at bedtime. 30 tablet 11   No current facility-administered medications on file prior to visit.     Allergies:   Allergies  Allergen Reactions   Dilaudid [Hydromorphone Hcl] Shortness Of Breath, Nausea And Vomiting and Other (See Comments)    Able to take  morphine without issue   Shellfish Allergy Shortness Of Breath and Rash    Only shrimp allergy   Aggrenox [Aspirin-Dipyridamole Er] Nausea And Vomiting and Other (See Comments)    Headache    Cephalexin Itching and Rash   Codeine Phosphate Nausea And Vomiting   Hydrocodone Nausea And Vomiting and Other (See Comments)    Can take morphine without issue   Meperidine Hcl Nausea And Vomiting   Percocet [Oxycodone-Acetaminophen] Nausea And Vomiting and Other (See Comments)    Can take morphine without issue    Physical Exam General: well developed, well nourished elderly African-American lady , seated, in no evident distress Head: head normocephalic and atraumatic.   Neck: supple with no carotid or supraclavicular bruits Cardiovascular: regular rate and rhythm, no murmurs Musculoskeletal: no deformity Skin:  no rash/petichiae Vascular:  Normal pulses all extremities  Neurologic Exam Mental Status: Awake and fully alert. Oriented to place and time. Recent and remote memory intact. Attention span, concentration and fund of knowledge appropriate. Mood and affect appropriate.  Cranial Nerves: Fundoscopic exam not done. Pupils equal, briskly reactive to light. Extraocular movements full without nystagmus. Visual fields full to confrontation. Hearing intact. Facial sensation intact. Face, tongue, palate moves normally and symmetrically.  Motor: Normal bulk and tone.Mild weakness of left grip and intrinsic hand muscles. Orbits right  over left upper extremity. Mild weakness of left hip flexors only.  Sensory.: intact to touch , pinprick , position and vibratory sensation. Mild subjective paresthesias in the right little finger but no objective sensory loss Coordination: Rapid alternating movements normal in all extremities. Finger-to-nose and heel-to-shin performed accurately bilaterally. Gait and Station: Arises from chair without difficulty. Stance is slightly broad-based Gait demonstrates  favoring of the right hip due to pain and stiffness and dragging of the right leg.. Unable to heel, toe and tandem walk without difficulty.  She uses a cane for walking. Reflexes: 1+ and symmetric. Toes downgoing.       ASSESSMENT: [age] year Caucasian lady with five-month history of gait and balance difficulties likely due to right pontine and cerebellar infarcts etiology likely small vessel disease. Multiple vascular risk factors of hypertension, hyperlipidemia, h/o sleep apnoea  .new left thalamic infarct in June 2018 with mild right lip and hand paresthesias. New l complaints of dysphagia for a month and persistent right little finger paresthesias likely late residual effect of stroke.   PLAN: I had a long discussion with the patient about her dysphagia nd residual right little finger paresthesias which may be residue from her stroke.  Continue Plavix for stroke prevention and strict control of hypertension with blood pressure goal below 130/90, lipids with LDL cholesterol goal below 70 mg percent and diabetes with hemoglobin A1c goal below 6.5%.  She was encouraged to use a cane at all times for fall and safety precautions.  She will return for follow-up in the future only as necessary.Greater than 50% time during this 30 minute visit was spent on counseling and coordination of care for her lacunar stroke and dysphagia.  She was advised to follow-up with her orthopedic surgeon for her chronic back and hip problems.  Refer to speech therapy for dysphagia evaluation and f/u screening carotid dopplers study greater than 50% time during this 25-minute visit was spent on counseling and coordination of care about her stroke, swallowing difficulties, numbness and answering questions..Followup in the future with stroke nurse practitioner in 6 months or call earlier if necessary. Delia HeadyPramod Shawnetta Lein, MD Note: This document was prepared with digital dictation and possible smart phrase technology. Any transcriptional  errors that result from this process are unintentional.

## 2019-04-04 NOTE — Progress Notes (Deleted)
Virtual Visit via Video Note   This visit type was conducted due to national recommendations for restrictions regarding the COVID-19 Pandemic (e.g. social distancing) in an effort to limit this patient's exposure and mitigate transmission in our community.  Due to her co-morbid illnesses, this patient is at least at moderate risk for complications without adequate follow up.  This format is felt to be most appropriate for this patient at this time.  All issues noted in this document were discussed and addressed.  A limited physical exam was performed with this format.  Please refer to the patient's chart for her consent to telehealth for [facility].   Date:  04/04/2019   ID:  Abigail [name], DOB 01/21/1946, MRN [id number]  Patient Location:Home Provider Location: Home  PCP:  Nelwyn SalisburyFry, Stephen A, MD  Cardiologist:  Dr Jens Somrenshaw  Evaluation Performed:  Follow-Up Visit  Chief Complaint:  FU palpitations  History of Present Illness:    FU palpitations. She has a long history of palpitations and presyncope. Patient apparently noted to have a PFO on transesophageal echocardiogram in 2006 following CVA. Event monitor in 2011 for palpitations showed sinus with PACs. Nuclear study September 2016 showed apical thinning and mild ischemia in the inferior lateral wall. Cardiac catheterization September 2016 showed no obstructive coronary disease and normal LV function. Carotid dopplers 6/18 showed 1-39 bilateral stenosis. Echo March 2019 showed vigorous LV function, mild diastolic dysfunction.  Had hip surgery complicated by DVT12/19. CTA December 2019 showed no pulmonary embolus.  Venous Dopplers June 2020 showed findings consistent with continued DVT in right peroneal vein.  Since last seen,   The patient does not have symptoms concerning for COVID-19 infection (fever, chills, cough, or new shortness of breath).    Past Medical History:  Diagnosis Date  . Bronchitis 09/2016  . Bruises easily   . DDD  (degenerative disc disease)   . Degenerative joint disease of cervical spine 09-05-11   Cervival area, now some osteoarthritis-lower back and Rt. shoulder  . Depression   . Diverticulitis 09-05-11   hx. gastritis, diverticulitis x2 -now surgery planned  . DIVERTICULITIS, ACUTE 02/10/2010  . Dyspnea    with exertion  . Essential hypertension 09-05-11   tx. Verapamil  . GROIN PAIN 09/21/2010  . Headache(784.0)    headaches are better  . Hearing loss    right ear  . Heart palpitations   . Hemorrhoids   . History of bronchitis   . History of colonic polyps   . History of pneumonia   . History of tension headache   . Hyperlipidemia   . Hypertension   . Hypothyroidism 09-05-11   Supplement used  . Late effect of adverse effect of drug, medicinal or biological substance   . NECK PAIN, ACUTE 09/21/2010  . Neuromuscular disorder (HCC)    carpal tunnel in both hands  . Nocturia   . Osteoarthritis 112-17-12   spine and rt. hip, rt. shoulder  . Patent foramen ovale    Small - unable to be closed -   . Pneumonia   . PONV (postoperative nausea and vomiting)   . Sleep apnea 09-05-11   no cpap ever, had surgery to remove cartilage, no problems now  . SORE THROAT 04/30/2009  . Stroke (HCC) 09-05-11   2006/2009-(loss of memory, balance issues remains occ.)  . TIA 09/28/2007, 10/2014  . Unstable angina (HCC)    a. 05/2010 Cath: nl cors, EF 55%;  b. 05/2015 Lexiscan MV: small, severe, fixed apical  defect and a small, severe, reversible inf lateral defect w/ apical thinning and mild ischemia, EF 54%.  Marland Kitchen URI 09/02/2008  . Vertigo    Past Surgical History:  Procedure Laterality Date  . ABDOMINAL HYSTERECTOMY    . BACK SURGERY    . CARDIAC CATHETERIZATION N/A 06/01/2015   Procedure: Left Heart Cath and Coronary Angiography;  Surgeon: Burnell Blanks, MD;  Location: [place] [facility];  Service: Cardiovascular;  Laterality: N/A;  . COLON RESECTION  09/07/2011   Procedure: LAPAROSCOPIC  SIGMOID COLON RESECTION;  Surgeon: Edward Jolly, MD;  Location: [facility];  Service: General;  Laterality: N/A;  with proctoscopy  . COLONOSCOPY  08/06/2015   per Dr. Earlean Shawl, adenomatous polyps, repeat in 5 yrs   . CYST EXCISION N/A 11/29/2016   Procedure: EXCISION UMBILICAL CYST;  Surgeon: Excell Seltzer, MD;  [facility];  Service: General;  Laterality: N/A;  . ELBOW SURGERY  09-05-11   left elbow -ligament repair  . EYE SURGERY     cataract  . INSERTION OF MESH N/A 12/13/2013   Procedure: INSERTION OF MESH;  Surgeon: Edward Jolly, MD;  [facility];  Service: General;  Laterality: N/A;  . LAPAROSCOPY  09/07/2011   Procedure: LAPAROSCOPY DIAGNOSTIC;  Surgeon: Shon Millet II;  [facility];  Service: Gynecology;  Laterality: N/A;  . MAXIMUM ACCESS (MAS)POSTERIOR LUMBAR INTERBODY FUSION (PLIF) 2 LEVEL N/A 05/03/2016   Procedure: L4-5 L5-S1 Maximum access posterior lumbar interbody fusion;  Surgeon: Erline Levine, MD;  Location: [place] [facility];  Service: Neurosurgery;  Laterality: N/A;  L4-5 L5-S1 Maximum access posterior lumbar interbody fusion  . SALPINGOOPHORECTOMY  09/07/2011   Procedure: SALPINGO OOPHERECTOMY;  Surgeon: Shon Millet II;  [facility];  Service: Gynecology;  Laterality: Right;  . THYROIDECTOMY, PARTIAL  09-05-11  . TOTAL HIP ARTHROPLASTY Right 08/21/2018   Procedure: TOTAL HIP ARTHROPLASTY ANTERIOR APPROACH;  Surgeon: Melrose Nakayama, MD;  Location: [place];  Service: Orthopedics;  Laterality: Right;  . TUBAL LIGATION  1983  . UMBILICAL HERNIA REPAIR  yrs ago  . VENTRAL HERNIA REPAIR  06/20/2012   Procedure: LAPAROSCOPIC VENTRAL HERNIA;  Surgeon: Edward Jolly, MD;  [facility];  Service: General;  Laterality: N/A;  Laparoscopic Repair of Ventral Hernia with mesh  . VENTRAL HERNIA REPAIR N/A 12/13/2013   Procedure: LAPAROSCOPIC REPAIR RECURRENT VENTRAL INCISIONAL  HERNIA;  Surgeon: Edward Jolly, MD;  [facility];  Service:  General;  Laterality: N/A;     No outpatient medications have been marked as taking for the 04/16/19 encounter (Appointment) with Lelon Perla, MD.     Allergies:   Dilaudid [hydromorphone hcl], Shellfish allergy, Aggrenox [aspirin-dipyridamole er], Cephalexin, Codeine phosphate, Hydrocodone, Meperidine hcl, and Percocet [oxycodone-acetaminophen]   Social History   Tobacco Use  . Smoking status: Former Smoker    Packs/day: 0.50    Years: 5.00    Pack years: 2.50    Types: Cigarettes    Quit date: 09/19/1968    Years since quitting: 50.5  . Smokeless tobacco: Never Used  Substance Use Topics  . Alcohol use: Yes    Alcohol/week: 2.0 standard drinks    Types: 1 Cans of beer, 1 Shots of liquor per week    Comment: rarely  . Drug use: No     Family Hx: The patient's family history includes Arrhythmia in her mother; Breast cancer in her sister and sister; Diabetes in her paternal grandmother; Hypertension in her mother; Stroke  in her maternal grandmother and mother.  ROS:   Please see the history of present illness.    No Fever, chills  or productive cough All other systems reviewed and are negative.  Recent Labs: 05/15/2018: TSH 1.52 07/23/2018: ALT 10 09/14/2018: BUN 9; Creatinine, Ser 0.95; Hemoglobin 11.6; Platelets 223; Potassium 3.2; Sodium 141   Recent Lipid Panel Lab Results  Component Value Date/Time   CHOL 162 05/15/2018 03:28 PM   CHOL CANCELED 11/17/2015 12:45 PM   TRIG 88.0 05/15/2018 03:28 PM   HDL 75.20 05/15/2018 03:28 PM   HDL CANCELED 11/17/2015 12:45 PM   CHOLHDL 2 05/15/2018 03:28 PM   LDLCALC 69 05/15/2018 03:28 PM   LDLCALC 70 11/17/2015 10:35 AM    Wt Readings from Last 3 Encounters:  04/01/19 183 lb (83 kg)  02/25/19 181 lb 6 oz (82.3 kg)  11/27/18 175 lb 4 oz (79.5 kg)     Objective:    Vital Signs:  LMP  (LMP Unknown)    VITAL SIGNS:  reviewed NAD Answers questions appropriately Normal affect Remainder of physical examination not  performed (telehealth visit; coronavirus pandemic)  ASSESSMENT & PLAN:    1. Palpitations-patient is on verapamil. 2. DVT-continue Xarelto.  Managed by primary care. 3. Hypertension-patient's blood pressure is controlled.  Continue present medications and follow. 4. History of CVA-felt secondary to patent foramen ovale.  Patient is allergic to aspirin.  We will continue with Xarelto. 5. Hyperlipidemia-continue statin.  COVID-19 Education: The importance of social distancing was discussed today.  Time:   Today, I have spent *** minutes with the patient with telehealth technology discussing the above problems.     Medication Adjustments/Labs and Tests Ordered: Current medicines are reviewed at length with the patient today.  Concerns regarding medicines are outlined above.   Tests Ordered: No orders of the defined types were placed in this encounter.   Medication Changes: No orders of the defined types were placed in this encounter.   Follow Up:  {F/U Format:[phone]} {follow up:15908}  Signed, Olga MillersBrian Crenshaw, MD  04/04/2019 12:13 PM    [facility]

## 2019-04-05 ENCOUNTER — Other Ambulatory Visit: Payer: Self-pay

## 2019-04-05 ENCOUNTER — Ambulatory Visit (HOSPITAL_COMMUNITY)
Admission: RE | Admit: 2019-04-05 | Discharge: 2019-04-05 | Disposition: A | Discharge status: Home / Self Care | Payer: Medicare Other | Source: Ambulatory Visit | Attending: Neurology | Admitting: Neurology

## 2019-04-05 ENCOUNTER — Encounter: Payer: Self-pay | Admitting: Family Medicine

## 2019-04-05 DIAGNOSIS — I6529 Occlusion and stenosis of unspecified carotid artery: Secondary | ICD-10-CM | POA: Diagnosis not present

## 2019-04-09 ENCOUNTER — Telehealth: Payer: Self-pay

## 2019-04-09 DIAGNOSIS — E782 Mixed hyperlipidemia: Secondary | ICD-10-CM

## 2019-04-09 NOTE — Telephone Encounter (Signed)
Copied from [place] [id number]. Topic: Appointment Scheduling - Scheduling Inquiry for Clinic >> Apr 05, 2019  3:17 PM Nils Flack wrote: Reason for CRM: pt is wanting to make lab appt for cholesterol and hepatitis screening No answer at office >> Apr 05, 2019  3:20 PM Cox, Melburn Hake, CMA wrote: No lab orders in chart

## 2019-04-09 NOTE — Telephone Encounter (Signed)
Please advise. Ok to place orders?

## 2019-04-10 NOTE — Telephone Encounter (Signed)
Left a detailed message on verified voice mail.  CRM created.  

## 2019-04-10 NOTE — Telephone Encounter (Signed)
The labs are ordered

## 2019-04-11 ENCOUNTER — Telehealth: Payer: Self-pay | Admitting: Family Medicine

## 2019-04-11 MED ORDER — BUTALBITAL-APAP-CAFFEINE 50-325-40 MG PO TABS: ORAL_TABLET | ORAL | 5 refills | Status: DC

## 2019-04-11 NOTE — Telephone Encounter (Signed)
Medication Refill - Medication:  butalbital-acetaminophen-caffeine (FIORICET, ESGIC) 50-325-40 MG tablet   Has the patient contacted their pharmacy? Yes advised to call PCP  Preferred Pharmacy (with phone number or street name):  [id number] - [place], [place] [phone] (Phone) [phone] (Fax)   Agent: Please be advised that RX refills may take up to 3 business days. We ask that you follow-up with your pharmacy.

## 2019-04-11 NOTE — Telephone Encounter (Signed)
Last filled 09/20/2018 Last OV 03/05/2019  Ok to fill?

## 2019-04-12 MED ORDER — BUTALBITAL-APAP-CAFFEINE 50-325-40 MG PO TABS: ORAL_TABLET | ORAL | 5 refills | Status: DC

## 2019-04-12 NOTE — Telephone Encounter (Signed)
done

## 2019-04-16 ENCOUNTER — Telehealth: Payer: Self-pay | Admitting: Radiology

## 2019-04-16 ENCOUNTER — Telehealth (INDEPENDENT_AMBULATORY_CARE_PROVIDER_SITE_OTHER): Payer: Medicare Other | Admitting: Cardiology

## 2019-04-16 ENCOUNTER — Telehealth: Payer: Self-pay | Admitting: Cardiology

## 2019-04-16 ENCOUNTER — Other Ambulatory Visit: Payer: Self-pay

## 2019-04-16 VITALS — BP 128/76 | HR 58 | Temp 97.2°F | Ht 65.5 in | Wt 183.0 lb

## 2019-04-16 DIAGNOSIS — R002 Palpitations: Secondary | ICD-10-CM | POA: Diagnosis not present

## 2019-04-16 DIAGNOSIS — I10 Essential (primary) hypertension: Secondary | ICD-10-CM

## 2019-04-16 DIAGNOSIS — E7800 Pure hypercholesterolemia, unspecified: Secondary | ICD-10-CM | POA: Diagnosis not present

## 2019-04-16 NOTE — Patient Instructions (Signed)
Medication Instructions:  NO CHANGE If you need a refill on your cardiac medications before your next appointment, please call your pharmacy.   Lab work: If you have labs (blood work) drawn today and your tests are completely normal, you will receive your results only by: Marland Kitchen MyChart Message (if you have MyChart) OR . A paper copy in the mail If you have any lab test that is abnormal or we need to change your treatment, we will call you to review the results.  Testing/Procedures: Your physician has recommended that you wear a 30 DAY event monitor. Event monitors are medical devices that record the heart's electrical activity. Doctors most often [place] these monitors to diagnose arrhythmias. Arrhythmias are problems with the speed or rhythm of the heartbeat. The monitor is a small, portable device. You can wear one while you do your normal daily activities. This is usually used to diagnose what is causing palpitations/syncope (passing out).    Follow-Up: At [facility], you and your health needs are our priority.  As part of our continuing mission to provide you with exceptional heart care, we have created designated Provider Care Teams.  These Care Teams include your primary Cardiologist (physician) and Advanced Practice Providers (APPs -  Physician Assistants and Nurse Practitioners) who all work together to provide you with the care you need, when you need it. You will need a follow up appointment in 6 months.  Please call our office 2 months in advance to schedule this appointment.  You may see Kirk Ruths, MD or one of the following Advanced Practice Providers on your designated Care Team:   Kerin Ransom, PA-C Roby Lofts, [place] . Sande Rives, PA-C

## 2019-04-16 NOTE — Telephone Encounter (Signed)
LVM, asking pt to call back and give consent for Virtual Visit on 04-16-19. °

## 2019-04-16 NOTE — Telephone Encounter (Signed)

## 2019-04-16 NOTE — Telephone Encounter (Signed)
Enrolled patient for a 30 day [employer] Event monitor to be mailed. Brief instructions were gone over with the patient and she knows to expect the monitor to arrive in 3-4 days.  

## 2019-04-16 NOTE — Progress Notes (Signed)
HPI: FU palpitations. She has a long history of palpitations and presyncope. Patient apparently noted to have a PFO on transesophageal echocardiogram in 2006 following CVA. Event monitor in 2011 for palpitations showed sinus with PACs. Nuclear study September 2016 showed apical thinning and mild ischemia in the inferior lateral wall. Cardiac catheterization September 2016 showed no obstructive coronary disease and normal LV function. Carotid dopplers 6/18 showed 1-39 bilateral stenosis.  Echocardiogram March 2019 showed normal LV function and mild diastolic dysfunction.  Had recent acute DVT following hip surgery.  Since last seen,  she does have palpitations associated with dizziness that last several minutes at a time.  They are sudden in onset.  She has some dyspnea on exertion but no chest pain.  Current Outpatient Medications  Medication Sig Dispense Refill  . atorvastatin (LIPITOR) 40 MG tablet Take 1 tablet (40 mg total) by mouth daily. 30 tablet 11  . butalbital-acetaminophen-caffeine (FIORICET) 50-325-40 MG tablet TAKE 1 TABLET BY MOUTH EVERY 6 HOURS AS NEEDED FOR HEADACHE 60 tablet 5  . clopidogrel (PLAVIX) 75 MG tablet Take 75 mg by mouth daily.    Marland Kitchen levothyroxine (SYNTHROID) 125 MCG tablet TAKE 1 TABLET (125 MCG TOTAL) BY MOUTH DAILY BEFORE BREAKFAST. 90 tablet 1  . losartan (COZAAR) 50 MG tablet TAKE 1 TABLET BY MOUTH EVERY DAY 30 tablet 3  . Menthol, Topical Analgesic, (ASPERCREME MAX ROLL-ON EX) Apply 1 application topically daily as needed (back pain).    . metroNIDAZOLE (FLAGYL) 500 MG tablet Take 1 tablet (500 mg total) by mouth 3 (three) times daily. 21 tablet 0  . potassium chloride (K-DUR) 10 MEQ tablet Take 1 tablet (10 mEq total) by mouth daily. 30 tablet 5  . tetrahydrozoline 0.05 % ophthalmic solution Place 1 drop into both eyes 4 (four) times daily as needed (for dry/irritated eyes.).    Marland Kitchen traMADol (ULTRAM) 50 MG tablet Take 2 tablets (100 mg total) by mouth every 6  (six) hours as needed for moderate pain or severe pain. TAKE 1 TO 2 TABLETS BY MOUTH EVERY 4 TO 6 HOURS AS NEEDED FOR PAIN 120 tablet 5  . verapamil (CALAN-SR) 120 MG CR tablet Take 1 tablet (120 mg total) by mouth at bedtime. 30 tablet 11   No current facility-administered medications for this visit.      Past Medical History:  Diagnosis Date  . Bronchitis 09/2016  . Bruises easily   . DDD (degenerative disc disease)   . Degenerative joint disease of cervical spine 09-05-11   Cervival area, now some osteoarthritis-lower back and Rt. shoulder  . Depression   . Diverticulitis 09-05-11   hx. gastritis, diverticulitis x2 -now surgery planned  . DIVERTICULITIS, ACUTE 02/10/2010  . Dyspnea    with exertion  . Essential hypertension 09-05-11   tx. Verapamil  . GROIN PAIN 09/21/2010  . Headache(784.0)    headaches are better  . Hearing loss    right ear  . Heart palpitations   . Hemorrhoids   . History of bronchitis   . History of colonic polyps   . History of pneumonia   . History of tension headache   . Hyperlipidemia   . Hypertension   . Hypothyroidism 09-05-11   Supplement used  . Late effect of adverse effect of drug, medicinal or biological substance   . NECK PAIN, ACUTE 09/21/2010  . Neuromuscular disorder ([place])    carpal tunnel in both hands  . Nocturia   . Osteoarthritis 112-17-12  spine and rt. hip, rt. shoulder  . Patent foramen ovale    Small - unable to be closed -   . Pneumonia   . PONV (postoperative nausea and vomiting)   . Sleep apnea 09-05-11   no cpap ever, had surgery to remove cartilage, no problems now  . SORE THROAT 04/30/2009  . Stroke (HCC) 09-05-11   2006/2009-(loss of memory, balance issues remains occ.)  . TIA 09/28/2007, 10/2014  . Unstable angina (HCC)    a. 05/2010 Cath: nl cors, EF 55%;  b. 05/2015 Lexiscan MV: small, severe, fixed apical defect and a small, severe, reversible inf lateral defect w/ apical thinning and mild ischemia, EF 54%.  Marland Kitchen.  URI 09/02/2008  . Vertigo     Past Surgical History:  Procedure Laterality Date  . ABDOMINAL HYSTERECTOMY    . BACK SURGERY    . CARDIAC CATHETERIZATION N/A 06/01/2015   Procedure: Left Heart Cath and Coronary Angiography;  Surgeon: Kathleene Hazelhristopher D McAlhany, MD;  [facility];  Service: Cardiovascular;  Laterality: N/A;  . COLON RESECTION  09/07/2011   Procedure: LAPAROSCOPIC SIGMOID COLON RESECTION;  Surgeon: Mariella SaaBenjamin T Hoxworth, MD;  [facility];  Service: General;  Laterality: N/A;  with proctoscopy  . COLONOSCOPY  08/06/2015   per Dr. Kinnie ScalesMedoff, adenomatous polyps, repeat in 5 yrs   . CYST EXCISION N/A 11/29/2016   Procedure: EXCISION UMBILICAL CYST;  Surgeon: Glenna FellowsBenjamin Hoxworth, MD;  [facility];  Service: General;  Laterality: N/A;  . ELBOW SURGERY  09-05-11   left elbow -ligament repair  . EYE SURGERY     cataract  . INSERTION OF MESH N/A 12/13/2013   Procedure: INSERTION OF MESH;  Surgeon: Mariella SaaBenjamin T Hoxworth, MD;  [facility];  Service: General;  Laterality: N/A;  . LAPAROSCOPY  09/07/2011   Procedure: LAPAROSCOPY DIAGNOSTIC;  Surgeon: Roselle LocusJames E Tomblin II;  [facility];  Service: Gynecology;  Laterality: N/A;  . MAXIMUM ACCESS (MAS)POSTERIOR LUMBAR INTERBODY FUSION (PLIF) 2 LEVEL N/A 05/03/2016   Procedure: L4-5 L5-S1 Maximum access posterior lumbar interbody fusion;  Surgeon: Maeola HarmanJoseph Stern, MD;  [facility];  Service: Neurosurgery;  Laterality: N/A;  L4-5 L5-S1 Maximum access posterior lumbar interbody fusion  . SALPINGOOPHORECTOMY  09/07/2011   Procedure: SALPINGO OOPHERECTOMY;  Surgeon: Roselle LocusJames E Tomblin II;  [facility];  Service: Gynecology;  Laterality: Right;  . THYROIDECTOMY, PARTIAL  09-05-11  . TOTAL HIP ARTHROPLASTY Right 08/21/2018   Procedure: TOTAL HIP ARTHROPLASTY ANTERIOR APPROACH;  Surgeon: Marcene Corningalldorf, Peter, MD;  Location: [facility];  Service: Orthopedics;  Laterality: Right;  . TUBAL LIGATION  1983  . UMBILICAL HERNIA REPAIR   yrs ago  . VENTRAL HERNIA REPAIR  06/20/2012   Procedure: LAPAROSCOPIC VENTRAL HERNIA;  Surgeon: Mariella SaaBenjamin T Hoxworth, MD;  [facility];  Service: General;  Laterality: N/A;  Laparoscopic Repair of Ventral Hernia with mesh  . VENTRAL HERNIA REPAIR N/A 12/13/2013   Procedure: LAPAROSCOPIC REPAIR RECURRENT VENTRAL INCISIONAL  HERNIA;  Surgeon: Mariella SaaBenjamin T Hoxworth, MD;  [facility];  Service: General;  Laterality: N/A;    Social History   Socioeconomic History  . Marital status: Married    Spouse name: Not on file  . Number of children: Not on file  . Years of education: Not on file  . Highest education level: Not on file  Occupational History  . Not on file  Social Needs  . Financial resource strain: Not on file  . Food insecurity  Worry: Not on file    Inability: Not on file  . Transportation needs    Medical: Not on file    Non-medical: Not on file  Tobacco Use  . Smoking status: Former Smoker    Packs/day: 0.50    Years: 5.00    Pack years: 2.50    Types: Cigarettes    Quit date: 09/19/1968    Years since quitting: 50.6  . Smokeless tobacco: Never Used  Substance and Sexual Activity  . Alcohol use: Yes    Alcohol/week: 2.0 standard drinks    Types: 1 Cans of beer, 1 Shots of liquor per week    Comment: rarely  . Drug use: No  . Sexual activity: Yes  Lifestyle  . Physical activity    Days per week: Not on file    Minutes per session: Not on file  . Stress: Not on file  Relationships  . Social Musicianconnections    Talks on phone: Not on file    Gets together: Not on file    Attends religious service: Not on file    Active member of club or organization: Not on file    Attends meetings of clubs or organizations: Not on file    Relationship status: Not on file  . Intimate partner violence    Fear of current or ex partner: Not on file    Emotionally abused: Not on file    Physically abused: Not on file    Forced sexual activity: Not on file  Other Topics  Concern  . Not on file  Social History Narrative   Lives in [place] with her husband.  She does not routinely exercise.    Family History  Problem Relation Age of Onset  . Arrhythmia Mother   . Hypertension Mother   . Stroke Mother   . Stroke Maternal Grandmother   . Diabetes Paternal Grandmother   . Breast cancer Sister        7850s  . Breast cancer Sister        5860s    ROS: Continued hip and right thigh pain following recent surgery but no fevers or chills, productive cough, hemoptysis, dysphasia, odynophagia, melena, hematochezia, dysuria, hematuria, rash, seizure activity, orthopnea, PND, pedal edema, claudication. Remaining systems are negative.  Physical Exam: Well-developed well-nourished in no acute distress.  Skin is warm and dry.  HEENT is normal.  Neck is supple.  Chest is clear to auscultation with normal expansion.  Cardiovascular exam is regular rate and rhythm.  Abdominal exam nontender or distended. No masses palpated. Extremities show no edema. neuro grossly intact  ECG-sinus rhythm at a rate of 58, left ventricular hypertrophy.  Personally reviewed  A/P  1 palpitations-patient describes palpitations as increased heart rate.  She has some dizziness associated.  Previous LV function normal.  We will plan an event monitor to further assess.  2 hypertension-blood pressure is controlled.  Continue present medications and follow.  3 recent DVT-managed by internal medicine.  Now off of Xarelto.  4 hyperlipidemia-continue statin.  5 history of CVA-continue Plavix.  Olga MillersBrian Akire Rennert, MD

## 2019-04-23 ENCOUNTER — Ambulatory Visit (INDEPENDENT_AMBULATORY_CARE_PROVIDER_SITE_OTHER): Payer: Medicare Other

## 2019-04-23 DIAGNOSIS — R002 Palpitations: Secondary | ICD-10-CM

## 2019-05-06 ENCOUNTER — Telehealth: Payer: Self-pay | Admitting: Medical

## 2019-05-06 NOTE — Telephone Encounter (Signed)
   Notified by [employer] heart monitor that patient pressed her alert button this morning indicating palpitations and passing out. Upon calling the patient, she report she was curling her hair around 5:40am when she felt sudden onset palpitations. No associated dizziness, lightheadedness, or syncope. She reports she sat down and symptoms resolved. [employer] confirmed patient was in NSR with HR in the 70s at that time. Patient feels back to normal. Will continue cardiac monitoring period at this time.   Abigail Butts, PA-C 05/06/19; 7:32 AM

## 2019-06-07 ENCOUNTER — Ambulatory Visit (INDEPENDENT_AMBULATORY_CARE_PROVIDER_SITE_OTHER): Payer: Medicare Other

## 2019-06-07 ENCOUNTER — Other Ambulatory Visit: Payer: Self-pay

## 2019-06-07 DIAGNOSIS — Z23 Encounter for immunization: Secondary | ICD-10-CM | POA: Diagnosis not present

## 2019-06-11 ENCOUNTER — Telehealth: Payer: Self-pay | Admitting: *Deleted

## 2019-06-11 NOTE — Telephone Encounter (Addendum)
-----   Message from Lelon Perla, MD sent at 06/10/2019  9:59 AM EDT ----- Discontinue verapamil.  Treat with Toprol 25 mg daily. Kirk Ruths  Left message for pt to call

## 2019-06-14 ENCOUNTER — Telehealth: Payer: Self-pay | Admitting: Cardiology

## 2019-06-14 MED ORDER — METOPROLOL SUCCINATE ER 25 MG PO TB24: 25.0000 mg | ORAL_TABLET | Freq: Every day | ORAL | 3 refills | Status: DC

## 2019-06-14 NOTE — Telephone Encounter (Signed)
No need to weaned off when substituting for Metoprolol.   Patient main BP medication at this time is losartan.   Agreed with previous recommendation: Patient to take metoprolol as written. Dose may be titrate if BP and HR remains elevated after 1 week.

## 2019-06-14 NOTE — Telephone Encounter (Signed)
Spoke with pt, aware of results. ?New script sent to the pharmacy  ? ?

## 2019-06-14 NOTE — Telephone Encounter (Signed)
Spoke with pt, when she went to the pharmacy to pick up the verapamil she was told not to stop it because it needed to be weaned. Explained to patient I will reach out to the pharm md in our office to confirm. The patient is concerned about the change causing her bp to be higher because it is high today. Explained to patient, the doseof metoprolol she is starting is low and after she has been on the new medication for 1 week, we need her to monitor her bp and if consistently above 130/85 she needs to let us know and we can increase the dose. Patient made aware the metoprolol will be better for her palpitations than verapamil. Patient voiced understanding and will get the metoprolol. Will call the patient back with the pharm md response. Pt agreed with this plan.

## 2019-06-14 NOTE — Telephone Encounter (Signed)
-----   Message from Lelon Perla, MD sent at 06/10/2019  9:59 AM EDT ----- Discontinue verapamil.  Treat with Toprol 25 mg daily. Kirk Ruths

## 2019-06-14 NOTE — Telephone Encounter (Signed)
Spoke with pt, aware no need to wean verapmill.

## 2019-06-14 NOTE — Telephone Encounter (Signed)
Follow Up:    She talked to Fredia Beets earlier this morning. They had told her to stop her Verapamil tonight.l. She wanted her to know that her blood pressure is  up to 142/91 and 152/91. Pt says she is scared not to take the Verapamil until  She gets her her new medicine. She wants to know what to do.

## 2019-06-17 ENCOUNTER — Encounter: Payer: Self-pay | Admitting: Family Medicine

## 2019-06-17 ENCOUNTER — Telehealth (INDEPENDENT_AMBULATORY_CARE_PROVIDER_SITE_OTHER): Payer: Medicare Other | Admitting: Family Medicine

## 2019-06-17 ENCOUNTER — Other Ambulatory Visit: Payer: Self-pay

## 2019-06-17 DIAGNOSIS — I10 Essential (primary) hypertension: Secondary | ICD-10-CM | POA: Diagnosis not present

## 2019-06-17 DIAGNOSIS — R002 Palpitations: Secondary | ICD-10-CM | POA: Diagnosis not present

## 2019-06-17 MED ORDER — LOSARTAN POTASSIUM 50 MG PO TABS: 50.0000 mg | ORAL_TABLET | Freq: Every day | ORAL | 11 refills | Status: DC

## 2019-06-17 MED ORDER — TRAMADOL HCL 50 MG PO TABS: 100.0000 mg | ORAL_TABLET | Freq: Four times a day (QID) | ORAL | 5 refills | Status: DC | PRN

## 2019-06-17 NOTE — Progress Notes (Signed)
Virtual Visit via Video Note  I connected with the patient on 06/17/19 at  2:30 PM EDT by a video enabled telemedicine application and verified that I am speaking with the correct person using two identifiers.  Location patient: home Location provider:work or home office Persons participating in the virtual visit: patient, provider  I discussed the limitations of evaluation and management by telemedicine and the availability of in person appointments. The patient expressed understanding and agreed to proceed.   HPI: Here to ask my opinion about her recent medication changes per Dr. Stanford Breed. She completed a cardiac monitor and this showed sinus rhythm with frequent PVCs and several runs of sinus tachycardia for 4-5 beats. After seeing this result, Dr. Stanford Breed decided to stop her Verapamil and switch to Metoprolol succinate 25 mg daily ( in addition to her Losartan). She asks if this is a good idea. She feels fine. Her BP has been in the 120s to 130s over 70s to 80s.  Virtual Visit via Video Note  I connected with the patient on 06/17/19 at  2:30 PM EDT by a video enabled telemedicine application and verified that I am speaking with the correct person using two identifiers.  Location patient: home Location provider:work or home office Persons participating in the virtual visit: patient, provider  I discussed the limitations of evaluation and management by telemedicine and the availability of in person appointments. The patient expressed understanding and agreed to proceed.   HPI:    ROS: See pertinent positives and negatives per HPI.  Past Medical History:  Diagnosis Date  . Bronchitis 09/2016  . Bruises easily   . DDD (degenerative disc disease)   . Degenerative joint disease of cervical spine 09-05-11   Cervival area, now some osteoarthritis-lower back and Rt. shoulder  . Depression   . Diverticulitis 09-05-11   hx. gastritis, diverticulitis x2 -now surgery planned  .  DIVERTICULITIS, ACUTE 02/10/2010  . Dyspnea    with exertion  . Essential hypertension 09-05-11   tx. Verapamil  . GROIN PAIN 09/21/2010  . Headache(784.0)    headaches are better  . Hearing loss    right ear  . Heart palpitations   . Hemorrhoids   . History of bronchitis   . History of colonic polyps   . History of pneumonia   . History of tension headache   . Hyperlipidemia   . Hypertension   . Hypothyroidism 09-05-11   Supplement used  . Late effect of adverse effect of drug, medicinal or biological substance   . NECK PAIN, ACUTE 09/21/2010  . Neuromuscular disorder ([facility])    carpal tunnel in both hands  . Nocturia   . Osteoarthritis 112-17-12   spine and rt. hip, rt. shoulder  . Patent foramen ovale    Small - unable to be closed -   . Pneumonia   . PONV (postoperative nausea and vomiting)   . Sleep apnea 09-05-11   no cpap ever, had surgery to remove cartilage, no problems now  . SORE THROAT 04/30/2009  . Stroke ([place]) 09-05-11   2006/2009-(loss of memory, balance issues remains occ.)  . TIA 09/28/2007, 10/2014  . Unstable angina ([place])    a. 05/2010 Cath: nl cors, EF 55%;  b. 05/2015 Lexiscan MV: small, severe, fixed apical defect and a small, severe, reversible inf lateral defect w/ apical thinning and mild ischemia, EF 54%.  Marland Kitchen URI 09/02/2008  . Vertigo     Past Surgical History:  Procedure Laterality Date  . ABDOMINAL  HYSTERECTOMY    . BACK SURGERY    . CARDIAC CATHETERIZATION N/A 06/01/2015   Procedure: Left Heart Cath and Coronary Angiography;  Surgeon: Kathleene Hazel, MD;  [facility]: [facility] INVASIVE CV LAB;  Service: Cardiovascular;  Laterality: N/A;  . COLON RESECTION  09/07/2011   Procedure: LAPAROSCOPIC SIGMOID COLON RESECTION;  Surgeon: Mariella Saa, MD;  Location: [facility];  Service: General;  Laterality: N/A;  with proctoscopy  . COLONOSCOPY  08/06/2015   per Dr. Kinnie Scales, adenomatous polyps, repeat in 5 yrs   . CYST EXCISION N/A 11/29/2016   Procedure:  EXCISION UMBILICAL CYST;  Surgeon: Glenna Fellows, MD;  Location: [facility];  Service: General;  Laterality: N/A;  . ELBOW SURGERY  09-05-11   left elbow -ligament repair  . EYE SURGERY     cataract  . INSERTION OF MESH N/A 12/13/2013   Procedure: INSERTION OF MESH;  Surgeon: Mariella Saa, MD;  Location: [facility];  Service: General;  Laterality: N/A;  . LAPAROSCOPY  09/07/2011   Procedure: LAPAROSCOPY DIAGNOSTIC;  Surgeon: Roselle Locus II;  Location: [facility];  Service: Gynecology;  Laterality: N/A;  . MAXIMUM ACCESS (MAS)POSTERIOR LUMBAR INTERBODY FUSION (PLIF) 2 LEVEL N/A 05/03/2016   Procedure: L4-5 L5-S1 Maximum access posterior lumbar interbody fusion;  Surgeon: Maeola Harman, MD;  Location: [facility];  Service: Neurosurgery;  Laterality: N/A;  L4-5 L5-S1 Maximum access posterior lumbar interbody fusion  . SALPINGOOPHORECTOMY  09/07/2011   Procedure: SALPINGO OOPHERECTOMY;  Surgeon: Roselle Locus II;  Location: [facility];  Service: Gynecology;  Laterality: Right;  . THYROIDECTOMY, PARTIAL  09-05-11  . TOTAL HIP ARTHROPLASTY Right 08/21/2018   Procedure: TOTAL HIP ARTHROPLASTY ANTERIOR APPROACH;  Surgeon: Marcene Corning, MD;  Location: [facility];  Service: Orthopedics;  Laterality: Right;  . TUBAL LIGATION  1983  . UMBILICAL HERNIA REPAIR  yrs ago  . VENTRAL HERNIA REPAIR  06/20/2012   Procedure: LAPAROSCOPIC VENTRAL HERNIA;  Surgeon: Mariella Saa, MD;  Location: [facility];  Service: General;  Laterality: N/A;  Laparoscopic Repair of Ventral Hernia with mesh  . VENTRAL HERNIA REPAIR N/A 12/13/2013   Procedure: LAPAROSCOPIC REPAIR RECURRENT VENTRAL INCISIONAL  HERNIA;  Surgeon: Mariella Saa, MD;  Location: [facility];  Service: General;  Laterality: N/A;    Family History  Problem Relation Age of Onset  . Arrhythmia Mother   . Hypertension Mother   . Stroke Mother   . Stroke Maternal Grandmother   . Diabetes Paternal Grandmother   . Breast cancer Sister        59s  . Breast  cancer Sister        61s     Current Outpatient Medications:  .  atorvastatin (LIPITOR) 40 MG tablet, Take 1 tablet (40 mg total) by mouth daily., Disp: 30 tablet, Rfl: 11 .  butalbital-acetaminophen-caffeine (FIORICET) 50-325-40 MG tablet, TAKE 1 TABLET BY MOUTH EVERY 6 HOURS AS NEEDED FOR HEADACHE, Disp: 60 tablet, Rfl: 5 .  clopidogrel (PLAVIX) 75 MG tablet, Take 75 mg by mouth daily., Disp: , Rfl:  .  levothyroxine (SYNTHROID) 125 MCG tablet, TAKE 1 TABLET (125 MCG TOTAL) BY MOUTH DAILY BEFORE BREAKFAST., Disp: 90 tablet, Rfl: 1 .  losartan (COZAAR) 50 MG tablet, Take 1 tablet (50 mg total) by mouth daily., Disp: 30 tablet, Rfl: 11 .  Menthol, Topical Analgesic, (ASPERCREME MAX ROLL-ON EX), Apply 1 application topically daily as needed (back pain)., Disp: , Rfl:  .  metoprolol succinate (TOPROL XL) 25 MG 24  hr tablet, Take 1 tablet (25 mg total) by mouth daily., Disp: 90 tablet, Rfl: 3 .  metroNIDAZOLE (FLAGYL) 500 MG tablet, Take 1 tablet (500 mg total) by mouth 3 (three) times daily., Disp: 21 tablet, Rfl: 0 .  potassium chloride (K-DUR) 10 MEQ tablet, Take 1 tablet (10 mEq total) by mouth daily., Disp: 30 tablet, Rfl: 5 .  tetrahydrozoline 0.05 % ophthalmic solution, Place 1 drop into both eyes 4 (four) times daily as needed (for dry/irritated eyes.)., Disp: , Rfl:  .  traMADol (ULTRAM) 50 MG tablet, Take 2 tablets (100 mg total) by mouth every 6 (six) hours as needed for moderate pain or severe pain. TAKE 1 TO 2 TABLETS BY MOUTH EVERY 4 TO 6 HOURS AS NEEDED FOR PAIN, Disp: 120 tablet, Rfl: 5  EXAM:  VITALS per patient if applicable:  GENERAL: alert, oriented, appears well and in no acute distress  HEENT: atraumatic, conjunttiva clear, no obvious abnormalities on inspection of external nose and ears  NECK: normal movements of the head and neck  LUNGS: on inspection no signs of respiratory distress, breathing rate appears normal, no obvious gross SOB, gasping or wheezing  CV: no  obvious cyanosis  MS: moves all visible extremities without noticeable abnormality  PSYCH/NEURO: pleasant and cooperative, no obvious depression or anxiety, speech and thought processing grossly intact  ASSESSMENT AND PLAN:  Discussed the following assessment and plan:  No diagnosis found.     I discussed the assessment and treatment plan with the patient. The patient was provided an opportunity to ask questions and all were answered. The patient agreed with the plan and demonstrated an understanding of the instructions.   The patient was advised to call back or seek an in-person evaluation if the symptoms worsen or if the condition fails to improve as anticipated.    ROS: See pertinent positives and negatives per HPI.  Past Medical History:  Diagnosis Date  . Bronchitis 09/2016  . Bruises easily   . DDD (degenerative disc disease)   . Degenerative joint disease of cervical spine 09-05-11   Cervival area, now some osteoarthritis-lower back and Rt. shoulder  . Depression   . Diverticulitis 09-05-11   hx. gastritis, diverticulitis x2 -now surgery planned  . DIVERTICULITIS, ACUTE 02/10/2010  . Dyspnea    with exertion  . Essential hypertension 09-05-11   tx. Verapamil  . GROIN PAIN 09/21/2010  . Headache(784.0)    headaches are better  . Hearing loss    right ear  . Heart palpitations   . Hemorrhoids   . History of bronchitis   . History of colonic polyps   . History of pneumonia   . History of tension headache   . Hyperlipidemia   . Hypertension   . Hypothyroidism 09-05-11   Supplement used  . Late effect of adverse effect of drug, medicinal or biological substance   . NECK PAIN, ACUTE 09/21/2010  . Neuromuscular disorder ([facility])    carpal tunnel in both hands  . Nocturia   . Osteoarthritis 112-17-12   spine and rt. hip, rt. shoulder  . Patent foramen ovale    Small - unable to be closed -   . Pneumonia   . PONV (postoperative nausea and vomiting)   . Sleep apnea  09-05-11   no cpap ever, had surgery to remove cartilage, no problems now  . SORE THROAT 04/30/2009  . Stroke ([facility]) 09-05-11   2006/2009-(loss of memory, balance issues remains occ.)  . TIA 09/28/2007, 10/2014  .  Unstable angina ([facility])    a. 05/2010 Cath: nl cors, EF 55%;  b. 05/2015 Lexiscan MV: small, severe, fixed apical defect and a small, severe, reversible inf lateral defect w/ apical thinning and mild ischemia, EF 54%.  Marland Kitchen URI 09/02/2008  . Vertigo     Past Surgical History:  Procedure Laterality Date  . ABDOMINAL HYSTERECTOMY    . BACK SURGERY    . CARDIAC CATHETERIZATION N/A 06/01/2015   Procedure: Left Heart Cath and Coronary Angiography;  Surgeon: Kathleene Hazel, MD;  Location: [facility] INVASIVE CV LAB;  Service: Cardiovascular;  Laterality: N/A;  . COLON RESECTION  09/07/2011   Procedure: LAPAROSCOPIC SIGMOID COLON RESECTION;  Surgeon: Mariella Saa, MD;  Location: [facility];  Service: General;  Laterality: N/A;  with proctoscopy  . COLONOSCOPY  08/06/2015   per Dr. Kinnie Scales, adenomatous polyps, repeat in 5 yrs   . CYST EXCISION N/A 11/29/2016   Procedure: EXCISION UMBILICAL CYST;  Surgeon: Glenna Fellows, MD;  Location: [facility];  Service: General;  Laterality: N/A;  . ELBOW SURGERY  09-05-11   left elbow -ligament repair  . EYE SURGERY     cataract  . INSERTION OF MESH N/A 12/13/2013   Procedure: INSERTION OF MESH;  Surgeon: Mariella Saa, MD;  [facility]: [facility];  Service: General;  Laterality: N/A;  . LAPAROSCOPY  09/07/2011   Procedure: LAPAROSCOPY DIAGNOSTIC;  Surgeon: Roselle Locus II;  Location: [facility];  Service: Gynecology;  Laterality: N/A;  . MAXIMUM ACCESS (MAS)POSTERIOR LUMBAR INTERBODY FUSION (PLIF) 2 LEVEL N/A 05/03/2016   Procedure: L4-5 L5-S1 Maximum access posterior lumbar interbody fusion;  Surgeon: Maeola Harman, MD;  Location: [facility];  Service: Neurosurgery;  Laterality: N/A;  L4-5 L5-S1 Maximum access posterior lumbar interbody fusion  .  SALPINGOOPHORECTOMY  09/07/2011   Procedure: SALPINGO OOPHERECTOMY;  Surgeon: Roselle Locus II;  Location: [facility];  Service: Gynecology;  Laterality: Right;  . THYROIDECTOMY, PARTIAL  09-05-11  . TOTAL HIP ARTHROPLASTY Right 08/21/2018   Procedure: TOTAL HIP ARTHROPLASTY ANTERIOR APPROACH;  Surgeon: Marcene Corning, MD;  Location: [facility];  Service: Orthopedics;  Laterality: Right;  . TUBAL LIGATION  1983  . UMBILICAL HERNIA REPAIR  yrs ago  . VENTRAL HERNIA REPAIR  06/20/2012   Procedure: LAPAROSCOPIC VENTRAL HERNIA;  Surgeon: Mariella Saa, MD;  Location: [facility];  Service: General;  Laterality: N/A;  Laparoscopic Repair of Ventral Hernia with mesh  . VENTRAL HERNIA REPAIR N/A 12/13/2013   Procedure: LAPAROSCOPIC REPAIR RECURRENT VENTRAL INCISIONAL  HERNIA;  Surgeon: Mariella Saa, MD;  Location: [facility];  Service: General;  Laterality: N/A;    Family History  Problem Relation Age of Onset  . Arrhythmia Mother   . Hypertension Mother   . Stroke Mother   . Stroke Maternal Grandmother   . Diabetes Paternal Grandmother   . Breast cancer Sister        70s  . Breast cancer Sister        25s     Current Outpatient Medications:  .  atorvastatin (LIPITOR) 40 MG tablet, Take 1 tablet (40 mg total) by mouth daily., Disp: 30 tablet, Rfl: 11 .  butalbital-acetaminophen-caffeine (FIORICET) 50-325-40 MG tablet, TAKE 1 TABLET BY MOUTH EVERY 6 HOURS AS NEEDED FOR HEADACHE, Disp: 60 tablet, Rfl: 5 .  clopidogrel (PLAVIX) 75 MG tablet, Take 75 mg by mouth daily., Disp: , Rfl:  .  levothyroxine (SYNTHROID) 125 MCG tablet, TAKE 1 TABLET (125 MCG TOTAL) BY MOUTH DAILY  BEFORE BREAKFAST., Disp: 90 tablet, Rfl: 1 .  losartan (COZAAR) 50 MG tablet, Take 1 tablet (50 mg total) by mouth daily., Disp: 30 tablet, Rfl: 11 .  Menthol, Topical Analgesic, (ASPERCREME MAX ROLL-ON EX), Apply 1 application topically daily as needed (back pain)., Disp: , Rfl:  .  metoprolol succinate (TOPROL XL) 25 MG 24 hr  tablet, Take 1 tablet (25 mg total) by mouth daily., Disp: 90 tablet, Rfl: 3 .  metroNIDAZOLE (FLAGYL) 500 MG tablet, Take 1 tablet (500 mg total) by mouth 3 (three) times daily., Disp: 21 tablet, Rfl: 0 .  potassium chloride (K-DUR) 10 MEQ tablet, Take 1 tablet (10 mEq total) by mouth daily., Disp: 30 tablet, Rfl: 5 .  tetrahydrozoline 0.05 % ophthalmic solution, Place 1 drop into both eyes 4 (four) times daily as needed (for dry/irritated eyes.)., Disp: , Rfl:  .  traMADol (ULTRAM) 50 MG tablet, Take 2 tablets (100 mg total) by mouth every 6 (six) hours as needed for moderate pain or severe pain. TAKE 1 TO 2 TABLETS BY MOUTH EVERY 4 TO 6 HOURS AS NEEDED FOR PAIN, Disp: 120 tablet, Rfl: 5  EXAM:  VITALS per patient if applicable:  GENERAL: alert, oriented, appears well and in no acute distress  HEENT: atraumatic, conjunttiva clear, no obvious abnormalities on inspection of external nose and ears  NECK: normal movements of the head and neck  LUNGS: on inspection no signs of respiratory distress, breathing rate appears normal, no obvious gross SOB, gasping or wheezing  CV: no obvious cyanosis  MS: moves all visible extremities without noticeable abnormality  PSYCH/NEURO: pleasant and cooperative, no obvious depression or anxiety, speech and thought processing grossly intact  ASSESSMENT AND PLAN: HTN with short runs of sinus tachycardia. I told her I agree with this plan. Accordingly she will pick up the Metoprolol and start this today. Recheck as needed.  Gershon Crane, MD  Discussed the following assessment and plan:  No diagnosis found.     I discussed the assessment and treatment plan with the patient. The patient was provided an opportunity to ask questions and all were answered. The patient agreed with the plan and demonstrated an understanding of the instructions.   The patient was advised to call back or seek an in-person evaluation if the symptoms worsen or if the condition  fails to improve as anticipated.

## 2019-06-20 ENCOUNTER — Other Ambulatory Visit: Payer: Self-pay

## 2019-06-20 ENCOUNTER — Telehealth (INDEPENDENT_AMBULATORY_CARE_PROVIDER_SITE_OTHER): Payer: Medicare Other | Admitting: Family Medicine

## 2019-06-20 ENCOUNTER — Encounter: Payer: Self-pay | Admitting: Family Medicine

## 2019-06-20 DIAGNOSIS — J069 Acute upper respiratory infection, unspecified: Secondary | ICD-10-CM

## 2019-06-20 DIAGNOSIS — Z20822 Contact with and (suspected) exposure to covid-19: Secondary | ICD-10-CM

## 2019-06-20 MED ORDER — AMOXICILLIN 500 MG PO CAPS: 500.0000 mg | ORAL_CAPSULE | Freq: Three times a day (TID) | ORAL | 0 refills | Status: DC

## 2019-06-20 NOTE — Progress Notes (Signed)
Virtual Visit via Video Note  I connected with the patient on 06/20/19 at  8:30 AM EDT by a video enabled telemedicine application and verified that I am speaking with the correct person using two identifiers.  Location patient: home Location provider:work or home office Persons participating in the virtual visit: patient, provider  I discussed the limitations of evaluation and management by telemedicine and the availability of in person appointments. The patient expressed understanding and agreed to proceed.   HPI: Here for 2 days of a bad headache and pain in the lymph nodes under her chin. No ST or fever. No cough or SOB. No body aches or NVD. Drinking fluids and taking Tylenol. She says she feels just like she does when she has a strep throat infection. Her children are planning on coming to visit her next week from [place] and from [place]. They are all getting Covid virus tests before they arrive.    ROS: See pertinent positives and negatives per HPI.  Past Medical History:  Diagnosis Date  . Bronchitis 09/2016  . Bruises easily   . DDD (degenerative disc disease)   . Degenerative joint disease of cervical spine 09-05-11   Cervival area, now some osteoarthritis-lower back and Rt. shoulder  . Depression   . Diverticulitis 09-05-11   hx. gastritis, diverticulitis x2 -now surgery planned  . DIVERTICULITIS, ACUTE 02/10/2010  . Dyspnea    with exertion  . Essential hypertension 09-05-11   tx. Verapamil  . GROIN PAIN 09/21/2010  . Headache(784.0)    headaches are better  . Hearing loss    right ear  . Heart palpitations   . Hemorrhoids   . History of bronchitis   . History of colonic polyps   . History of pneumonia   . History of tension headache   . Hyperlipidemia   . Hypertension   . Hypothyroidism 09-05-11   Supplement used  . Late effect of adverse effect of drug, medicinal or biological substance   . NECK PAIN, ACUTE 09/21/2010  . Neuromuscular disorder ([place])    carpal tunnel in both hands  . Nocturia   . Osteoarthritis 112-17-12   spine and rt. hip, rt. shoulder  . Patent foramen ovale    Small - unable to be closed -   . Pneumonia   . PONV (postoperative nausea and vomiting)   . Sleep apnea 09-05-11   no cpap ever, had surgery to remove cartilage, no problems now  . SORE THROAT 04/30/2009  . Stroke ([place]) 09-05-11   2006/2009-(loss of memory, balance issues remains occ.)  . TIA 09/28/2007, 10/2014  . Unstable angina ([place])    a. 05/2010 Cath: nl cors, EF 55%;  b. 05/2015 Lexiscan MV: small, severe, fixed apical defect and a small, severe, reversible inf lateral defect w/ apical thinning and mild ischemia, EF 54%.  Marland Kitchen URI 09/02/2008  . Vertigo     Past Surgical History:  Procedure Laterality Date  . ABDOMINAL HYSTERECTOMY    . BACK SURGERY    . CARDIAC CATHETERIZATION N/A 06/01/2015   Procedure: Left Heart Cath and Coronary Angiography;  Surgeon: Burnell Blanks, MD;  Location: [facility];  Service: Cardiovascular;  Laterality: N/A;  . COLON RESECTION  09/07/2011   Procedure: LAPAROSCOPIC SIGMOID COLON RESECTION;  Surgeon: Edward Jolly, MD;  Location: [facility];  Service: General;  Laterality: N/A;  with proctoscopy  . COLONOSCOPY  08/06/2015   per Dr. Earlean Shawl, adenomatous polyps, repeat in 5 yrs   .  CYST EXCISION N/A 11/29/2016   Procedure: EXCISION UMBILICAL CYST;  Surgeon: Glenna FellowsBenjamin Hoxworth, MD;  Location: [facility];  Service: General;  Laterality: N/A;  . ELBOW SURGERY  09-05-11   left elbow -ligament repair  . EYE SURGERY     cataract  . INSERTION OF MESH N/A 12/13/2013   Procedure: INSERTION OF MESH;  Surgeon: Mariella SaaBenjamin T Hoxworth, MD;  Location: [facility];  Service: General;  Laterality: N/A;  . LAPAROSCOPY  09/07/2011   Procedure: LAPAROSCOPY DIAGNOSTIC;  Surgeon: Roselle LocusJames E Tomblin II;  Location: [facility];  Service: Gynecology;  Laterality: N/A;  . MAXIMUM ACCESS (MAS)POSTERIOR LUMBAR INTERBODY FUSION (PLIF) 2 LEVEL N/A  05/03/2016   Procedure: L4-5 L5-S1 Maximum access posterior lumbar interbody fusion;  Surgeon: Maeola HarmanJoseph Stern, MD;  Location: [facility];  Service: Neurosurgery;  Laterality: N/A;  L4-5 L5-S1 Maximum access posterior lumbar interbody fusion  . SALPINGOOPHORECTOMY  09/07/2011   Procedure: SALPINGO OOPHERECTOMY;  Surgeon: Roselle LocusJames E Tomblin II;  Location: [facility];  Service: Gynecology;  Laterality: Right;  . THYROIDECTOMY, PARTIAL  09-05-11  . TOTAL HIP ARTHROPLASTY Right 08/21/2018   Procedure: TOTAL HIP ARTHROPLASTY ANTERIOR APPROACH;  Surgeon: Marcene Corningalldorf, Peter, MD;  Location: [facility];  Service: Orthopedics;  Laterality: Right;  . TUBAL LIGATION  1983  . UMBILICAL HERNIA REPAIR  yrs ago  . VENTRAL HERNIA REPAIR  06/20/2012   Procedure: LAPAROSCOPIC VENTRAL HERNIA;  Surgeon: Mariella SaaBenjamin T Hoxworth, MD;  Location: [facility];  Service: General;  Laterality: N/A;  Laparoscopic Repair of Ventral Hernia with mesh  . VENTRAL HERNIA REPAIR N/A 12/13/2013   Procedure: LAPAROSCOPIC REPAIR RECURRENT VENTRAL INCISIONAL  HERNIA;  Surgeon: Mariella SaaBenjamin T Hoxworth, MD;  Location: [facility];  Service: General;  Laterality: N/A;    Family History  Problem Relation Age of Onset  . Arrhythmia Mother   . Hypertension Mother   . Stroke Mother   . Stroke Maternal Grandmother   . Diabetes Paternal Grandmother   . Breast cancer Sister        2350s  . Breast cancer Sister        8360s     Current Outpatient Medications:  .  amoxicillin (AMOXIL) 500 MG capsule, Take 1 capsule (500 mg total) by mouth 3 (three) times daily., Disp: 30 capsule, Rfl: 0 .  atorvastatin (LIPITOR) 40 MG tablet, Take 1 tablet (40 mg total) by mouth daily., Disp: 30 tablet, Rfl: 11 .  butalbital-acetaminophen-caffeine (FIORICET) 50-325-40 MG tablet, TAKE 1 TABLET BY MOUTH EVERY 6 HOURS AS NEEDED FOR HEADACHE, Disp: 60 tablet, Rfl: 5 .  clopidogrel (PLAVIX) 75 MG tablet, Take 75 mg by mouth daily., Disp: , Rfl:  .  levothyroxine (SYNTHROID) 125 MCG tablet, TAKE 1  TABLET (125 MCG TOTAL) BY MOUTH DAILY BEFORE BREAKFAST., Disp: 90 tablet, Rfl: 1 .  losartan (COZAAR) 50 MG tablet, Take 1 tablet (50 mg total) by mouth daily., Disp: 30 tablet, Rfl: 11 .  Menthol, Topical Analgesic, (ASPERCREME MAX ROLL-ON EX), Apply 1 application topically daily as needed (back pain)., Disp: , Rfl:  .  metoprolol succinate (TOPROL XL) 25 MG 24 hr tablet, Take 1 tablet (25 mg total) by mouth daily., Disp: 90 tablet, Rfl: 3 .  metroNIDAZOLE (FLAGYL) 500 MG tablet, Take 1 tablet (500 mg total) by mouth 3 (three) times daily., Disp: 21 tablet, Rfl: 0 .  potassium chloride (K-DUR) 10 MEQ tablet, Take 1 tablet (10 mEq total) by mouth daily., Disp: 30 tablet, Rfl: 5 .  tetrahydrozoline 0.05 % ophthalmic solution,  Place 1 drop into both eyes 4 (four) times daily as needed (for dry/irritated eyes.)., Disp: , Rfl:  .  traMADol (ULTRAM) 50 MG tablet, Take 2 tablets (100 mg total) by mouth every 6 (six) hours as needed for moderate pain or severe pain. TAKE 1 TO 2 TABLETS BY MOUTH EVERY 4 TO 6 HOURS AS NEEDED FOR PAIN, Disp: 120 tablet, Rfl: 5  EXAM:  VITALS per patient if applicable:  GENERAL: alert, oriented, appears well and in no acute distress  HEENT: atraumatic, conjunttiva clear, no obvious abnormalities on inspection of external nose and ears  NECK: normal movements of the head and neck  LUNGS: on inspection no signs of respiratory distress, breathing rate appears normal, no obvious gross SOB, gasping or wheezing  CV: no obvious cyanosis  MS: moves all visible extremities without noticeable abnormality  PSYCH/NEURO: pleasant and cooperative, no obvious depression or anxiety, speech and thought processing grossly intact  ASSESSMENT AND PLAN: This is difficult to assess virtually but we will cover for possible strep with 10 days of Amoxicillin. However I advised her to be tested today for the Covid virus as well, and she agreed.  Gershon Crane, MD   Discussed the following  assessment and plan:  No diagnosis found.     I discussed the assessment and treatment plan with the patient. The patient was provided an opportunity to ask questions and all were answered. The patient agreed with the plan and demonstrated an understanding of the instructions.   The patient was advised to call back or seek an in-person evaluation if the symptoms worsen or if the condition fails to improve as anticipated.

## 2019-06-21 LAB — NOVEL CORONAVIRUS, NAA: SARS-CoV-2, NAA: NOT DETECTED

## 2019-06-25 ENCOUNTER — Telehealth: Payer: Self-pay | Admitting: Cardiology

## 2019-06-25 NOTE — Telephone Encounter (Signed)
164/95 BP- last night. Patient only takes losartan in the morning, and the Metoprolol at night time. Patient states she is having episodes of fast beating heart, she does not have her HR at this time.   Patient has had them every other day- she states that last night was the worse, she was walking and it came over her quickly. She denies chest pains, more like an ache after the episodes.   Patient has had no episodes this morning, denies swelling and does mention having SOB at times. Patient was advised to monitor BP throughout the day at different times, and write them down- as well as pulse rate when she has these issues.  Patient verbalized understanding, but wanted advice on if any changes should be made to medications- since she is a stroke victim and has had other health issue. I advised I would send a message to MD and PharmD for any other recommendations.

## 2019-06-25 NOTE — Telephone Encounter (Signed)
Patient states her BP have been high with racing HR, she wants to know if her medication metoprolol succinate (TOPROL XL) 25 MG 24 hr tablet needs to be increased. She didn't have actual BP readings.

## 2019-06-25 NOTE — Telephone Encounter (Signed)
Spoke with pt, Aware of dr crenshaw's recommendations.  °

## 2019-06-25 NOTE — Telephone Encounter (Signed)
Recent monitor showed sinus with 4-5 beats of nonsustained ventricular tachycardia.  Continue present medications and follow heart rate and blood pressure. Kirk Ruths

## 2019-07-15 ENCOUNTER — Other Ambulatory Visit: Payer: Self-pay | Admitting: Family Medicine

## 2019-08-02 ENCOUNTER — Telehealth: Payer: Self-pay | Admitting: Cardiology

## 2019-08-02 NOTE — Telephone Encounter (Signed)
° ° °  Pt c/o medication issue:  1. Name of Medication: metoprolol  2. How are you currently taking this medication (dosage and times per day)? As written  3. Are you having a reaction (difficulty breathing--STAT)?vertigo  4. What is your medication issue? Patient wants to know if med can cause vertigo.    BP readings starting November 1 141/80 HR 58 150/91 HR 63 138/88 HR 73 126/83 HR 62 150/100 HR 56 147/92 HR 56

## 2019-08-02 NOTE — Telephone Encounter (Signed)
Spoke to pt who called inquiring if Metoprolol could cause symptoms similar to vertigo. Pt report since oct she has been feeling dizzy when she move her head. She also report experiencing some SOB even at rest. Vitals listed below.  141/80 HR 58 150/94 HR 63 138/88 HR 73 126/83 HR 62 150/100 HR 56 147/92 HR 56  Nurse offered in office appointment for further evaluations but pt report she would prefer virtual. Appointment scheduled for 11/16.

## 2019-08-05 ENCOUNTER — Telehealth: Payer: Medicare Other | Admitting: Adult Health

## 2019-08-13 ENCOUNTER — Ambulatory Visit: Payer: Medicare Other | Admitting: General Practice

## 2019-08-20 NOTE — Progress Notes (Deleted)
error 

## 2019-08-21 ENCOUNTER — Ambulatory Visit: Payer: Medicare Other | Admitting: General Practice

## 2019-09-06 ENCOUNTER — Other Ambulatory Visit: Payer: Self-pay

## 2019-09-06 ENCOUNTER — Ambulatory Visit: Payer: Medicare Other | Admitting: General Practice

## 2019-09-06 ENCOUNTER — Encounter: Payer: Self-pay | Admitting: General Practice

## 2019-09-06 VITALS — BP 151/89 | HR 51 | Ht 65.0 in | Wt 192.4 lb

## 2019-09-06 DIAGNOSIS — Z79899 Other long term (current) drug therapy: Secondary | ICD-10-CM

## 2019-09-06 DIAGNOSIS — I10 Essential (primary) hypertension: Secondary | ICD-10-CM

## 2019-09-06 DIAGNOSIS — E7800 Pure hypercholesterolemia, unspecified: Secondary | ICD-10-CM

## 2019-09-06 DIAGNOSIS — R002 Palpitations: Secondary | ICD-10-CM

## 2019-09-06 DIAGNOSIS — I639 Cerebral infarction, unspecified: Secondary | ICD-10-CM

## 2019-09-06 MED ORDER — AMLODIPINE BESYLATE 2.5 MG PO TABS: 2.5000 mg | ORAL_TABLET | Freq: Every day | ORAL | 6 refills | Status: DC

## 2019-09-06 NOTE — Progress Notes (Signed)
Cardiology Clinic Note   Patient Name: Abigail [name] Date of Encounter: 09/06/2019  Primary Care Provider:  Laurey Morale, MD Primary Cardiologist:  Kirk Ruths, MD  Patient Profile    Abigail [name] 73 year old female presents today for evaluation of her dizziness and palpitations.  Past Medical History    Past Medical History:  Diagnosis Date  . Bronchitis 09/2016  . Bruises easily   . DDD (degenerative disc disease)   . Degenerative joint disease of cervical spine 09-05-11   Cervival area, now some osteoarthritis-lower back and Rt. shoulder  . Depression   . Diverticulitis 09-05-11   hx. gastritis, diverticulitis x2 -now surgery planned  . DIVERTICULITIS, ACUTE 02/10/2010  . Dyspnea    with exertion  . Essential hypertension 09-05-11   tx. Verapamil  . GROIN PAIN 09/21/2010  . Headache(784.0)    headaches are better  . Hearing loss    right ear  . Heart palpitations   . Hemorrhoids   . History of bronchitis   . History of colonic polyps   . History of pneumonia   . History of tension headache   . Hyperlipidemia   . Hypertension   . Hypothyroidism 09-05-11   Supplement used  . Late effect of adverse effect of drug, medicinal or biological substance   . NECK PAIN, ACUTE 09/21/2010  . Neuromuscular disorder ([place])    carpal tunnel in both hands  . Nocturia   . Osteoarthritis 112-17-12   spine and rt. hip, rt. shoulder  . Patent foramen ovale    Small - unable to be closed -   . Pneumonia   . PONV (postoperative nausea and vomiting)   . Sleep apnea 09-05-11   no cpap ever, had surgery to remove cartilage, no problems now  . SORE THROAT 04/30/2009  . Stroke ([place]) 09-05-11   2006/2009-(loss of memory, balance issues remains occ.)  . TIA 09/28/2007, 10/2014  . Unstable angina ([place])    a. 05/2010 Cath: nl cors, EF 55%;  b. 05/2015 Lexiscan MV: small, severe, fixed apical defect and a small, severe, reversible inf lateral defect w/ apical thinning and mild ischemia, EF  54%.  Marland Kitchen URI 09/02/2008  . Vertigo    Past Surgical History:  Procedure Laterality Date  . ABDOMINAL HYSTERECTOMY    . BACK SURGERY    . CARDIAC CATHETERIZATION N/A 06/01/2015   Procedure: Left Heart Cath and Coronary Angiography;  Surgeon: Burnell Blanks, MD;  Location: [facility];  Service: Cardiovascular;  Laterality: N/A;  . COLON RESECTION  09/07/2011   Procedure: LAPAROSCOPIC SIGMOID COLON RESECTION;  Surgeon: Edward Jolly, MD;  Location: [facility];  Service: General;  Laterality: N/A;  with proctoscopy  . COLONOSCOPY  08/06/2015   per Dr. Earlean Shawl, adenomatous polyps, repeat in 5 yrs   . CYST EXCISION N/A 11/29/2016   Procedure: EXCISION UMBILICAL CYST;  Surgeon: Excell Seltzer, MD;  Location: [facility];  Service: General;  Laterality: N/A;  . ELBOW SURGERY  09-05-11   left elbow -ligament repair  . EYE SURGERY     cataract  . INSERTION OF MESH N/A 12/13/2013   Procedure: INSERTION OF MESH;  Surgeon: Edward Jolly, MD;  Location: [facility];  Service: General;  Laterality: N/A;  . LAPAROSCOPY  09/07/2011   Procedure: LAPAROSCOPY DIAGNOSTIC;  Surgeon: Shon Millet II;  Location: [facility];  Service: Gynecology;  Laterality: N/A;  . MAXIMUM ACCESS (MAS)POSTERIOR LUMBAR INTERBODY FUSION (PLIF) 2 LEVEL N/A 05/03/2016  Procedure: L4-5 L5-S1 Maximum access posterior lumbar interbody fusion;  Surgeon: Joseph Stern, MD;  Location: [facility];  Service: Neurosurgery; Maeola Harman Laterality: N/A;  L4-5 L5-S1 Maximum access posterior lumbar interbody fusion  . SALPINGOOPHORECTOMY  09/07/2011   Procedure: SALPINGO OOPHERECTOMY;  Surgeon: Roselle LocusJames E Tomblin II;  Location: [facility];  Service: Gynecology;  Laterality: Right;  . THYROIDECTOMY, PARTIAL  09-05-11  . TOTAL HIP ARTHROPLASTY Right 08/21/2018   Procedure: TOTAL HIP ARTHROPLASTY ANTERIOR APPROACH;  Surgeon: Marcene Corningalldorf, Peter, MD;  Location: [facility];  Service: Orthopedics;  Laterality: Right;  . TUBAL LIGATION  1983  . UMBILICAL HERNIA  REPAIR  yrs ago  . VENTRAL HERNIA REPAIR  06/20/2012   Procedure: LAPAROSCOPIC VENTRAL HERNIA;  Surgeon: Mariella SaaBenjamin T Hoxworth, MD;  Location: [facility];  Service: General;  Laterality: N/A;  Laparoscopic Repair of Ventral Hernia with mesh  . VENTRAL HERNIA REPAIR N/A 12/13/2013   Procedure: LAPAROSCOPIC REPAIR RECURRENT VENTRAL INCISIONAL  HERNIA;  Surgeon: Mariella SaaBenjamin T Hoxworth, MD;  Location: [facility];  Service: General;  Laterality: N/A;    Allergies  Allergies  Allergen Reactions  . Dilaudid [Hydromorphone Hcl] Shortness Of Breath, Nausea And Vomiting and Other (See Comments)    Able to take morphine without issue  . Shellfish Allergy Shortness Of Breath and Rash    Only shrimp allergy  . Aggrenox [Aspirin-Dipyridamole Er] Nausea And Vomiting and Other (See Comments)    Headache   . Cephalexin Itching and Rash  . Codeine Phosphate Nausea And Vomiting  . Hydrocodone Nausea And Vomiting and Other (See Comments)    Can take morphine without issue  . Meperidine Hcl Nausea And Vomiting  . Percocet [Oxycodone-Acetaminophen] Nausea And Vomiting and Other (See Comments)    Can take morphine without issue    History of Present Illness    [name] has a long history of palpitations and presyncope.  She was noted to have a PFO on TEE echocardiogram in 2006 following CVA.  An event monitor in 2011 for palpitations showed sinus with PACs.  And nuclear study 05/2015 showed apical thinning and mild ischemia in the inferior lateral wall.  A cardiac catheterization on 05/2015 showed nonobstructive coronary artery disease and normal LV function.  Her carotid Dopplers that were done on 6/18 showed 1-39 bilateral stenosis.  An echocardiogram 11/2017 showed normal LV function and mild diastolic dysfunction.  She had a recent acute DVT following hip surgery 2020.  She was last seen by Dr. Jens Somrenshaw on 04/16/2019.  During that time she described palpitations associated with dizziness that would last several minutes at  a time.  She described the episodes as sudden in onset.  She did have some dyspnea on exertion but no chest pain at that time.  She contacted the triage nurse on 08/02/2019 with vertigo type symptoms.  Her blood pressures were high 130s to 150s over 80s with heart rates in the high 50s to low 70s.  She presents to the clinic today and states she has not had any dizziness or noticed any palpitations since she contacted the office 1 month ago.  She states she has been very sedentary with the COVID-19 pandemic.  She states that her diet is poor.  She does not eat very much and enjoys things such as biscuits.  She indicates that for 2-1/2 hours today she had chest discomfort on her left side that radiated down her left arm to her elbow.  She states this was mild in nature and went away with  laying down/rest.  She states that she has this type of chest discomfort every other month.  Her chest discomfort was nonexertional in nature and came on while she was watching television.  This chest discomfort is not reproducible in the clinic today.  It does not appear to be cardiac in nature.  I will start her on amlodipine 2.5, reevaluate her lipids, and encourage her to eat a low-sodium heart healthy diet.  She denies shortness of breath, lower extremity edema, fatigue, palpitations, melena, hematuria, hemoptysis, diaphoresis, weakness, presyncope, syncope, orthopnea, and PND.   Home Medications    Prior to Admission medications   Medication Sig Start Date End Date Taking? Authorizing Provider  amoxicillin (AMOXIL) 500 MG capsule Take 1 capsule (500 mg total) by mouth 3 (three) times daily. 06/20/19   Nelwyn Salisbury, MD  atorvastatin (LIPITOR) 40 MG tablet Take 1 tablet (40 mg total) by mouth daily. 11/27/18   Nelwyn Salisbury, MD  butalbital-acetaminophen-caffeine (FIORICET) [phone] MG tablet TAKE 1 TABLET BY MOUTH EVERY 6 HOURS AS NEEDED FOR HEADACHE 04/12/19   Nelwyn Salisbury, MD  clopidogrel (PLAVIX) 75 MG  tablet Take 75 mg by mouth daily. 03/12/19   [provider]  levothyroxine (SYNTHROID) 125 MCG tablet TAKE 1 TABLET (125 MCG TOTAL) BY MOUTH DAILY BEFORE BREAKFAST. 03/19/19   Nelwyn Salisbury, MD  losartan (COZAAR) 50 MG tablet TAKE 1 TABLET BY MOUTH EVERY DAY 07/15/19   Nelwyn Salisbury, MD  Menthol, Topical Analgesic, (ASPERCREME MAX ROLL-ON EX) Apply 1 application topically daily as needed (back pain).    [provider]  metoprolol succinate (TOPROL XL) 25 MG 24 hr tablet Take 1 tablet (25 mg total) by mouth daily. 06/14/19   Lewayne Bunting, MD  metroNIDAZOLE (FLAGYL) 500 MG tablet Take 1 tablet (500 mg total) by mouth 3 (three) times daily. 12/14/18   Nelwyn Salisbury, MD  potassium chloride (K-DUR) 10 MEQ tablet Take 1 tablet (10 mEq total) by mouth daily. 09/20/18   Nelwyn Salisbury, MD  tetrahydrozoline 0.05 % ophthalmic solution Place 1 drop into both eyes 4 (four) times daily as needed (for dry/irritated eyes.).    [provider]  traMADol (ULTRAM) 50 MG tablet Take 2 tablets (100 mg total) by mouth every 6 (six) hours as needed for moderate pain or severe pain. TAKE 1 TO 2 TABLETS BY MOUTH EVERY 4 TO 6 HOURS AS NEEDED FOR PAIN 06/17/19   Nelwyn Salisbury, MD    Family History    Family History  Problem Relation Age of Onset  . Arrhythmia Mother   . Hypertension Mother   . Stroke Mother   . Stroke Maternal Grandmother   . Diabetes Paternal Grandmother   . Breast cancer Sister        66s  . Breast cancer Sister        48s   She indicated that her mother is deceased. She indicated that her father is deceased. She indicated that her maternal grandmother is deceased. She indicated that her maternal grandfather is deceased. She indicated that her paternal grandmother is deceased. She indicated that her paternal grandfather is deceased.  Social History    Social History   Socioeconomic History  . Marital status: Married    Spouse name: Not on file  . Number of  children: Not on file  . Years of education: Not on file  . Highest education level: Not on file  Occupational History  . Not on file  Tobacco Use  . Smoking status: Former Smoker    Packs/day: 0.50    Years: 5.00    Pack years: 2.50    Types: Cigarettes    Quit date: 09/19/1968    Years since quitting: 50.9  . Smokeless tobacco: Never Used  Substance and Sexual Activity  . Alcohol use: Yes    Alcohol/week: 2.0 standard drinks    Types: 1 Cans of beer, 1 Shots of liquor per week    Comment: rarely  . Drug use: No  . Sexual activity: Yes  Other Topics Concern  . Not on file  Social History Narrative   Lives in [place] with her husband.  She does not routinely exercise.   Social Determinants of Health   Financial Resource Strain:   . Difficulty of Paying Living Expenses: Not on file  Food Insecurity:   . Worried About Programme researcher, broadcasting/film/video in the Last Year: Not on file  . Ran Out of Food in the Last Year: Not on file  Transportation Needs:   . Lack of Transportation (Medical): Not on file  . Lack of Transportation (Non-Medical): Not on file  Physical Activity:   . Days of Exercise per Week: Not on file  . Minutes of Exercise per Session: Not on file  Stress:   . Feeling of Stress : Not on file  Social Connections:   . Frequency of Communication with Friends and Family: Not on file  . Frequency of Social Gatherings with Friends and Family: Not on file  . Attends Religious Services: Not on file  . Active Member of Clubs or Organizations: Not on file  . Attends Banker Meetings: Not on file  . Marital Status: Not on file  Intimate Partner Violence:   . Fear of Current or Ex-Partner: Not on file  . Emotionally Abused: Not on file  . Physically Abused: Not on file  . Sexually Abused: Not on file     Review of Systems    General:  No chills, fever, night sweats or weight changes.  Cardiovascular:  No  dyspnea on exertion, edema, orthopnea, palpitations,  paroxysmal nocturnal dyspnea. Dermatological: No rash, lesions/masses Respiratory: No cough, dyspnea Urologic: No hematuria, dysuria Abdominal:   No nausea, vomiting, diarrhea, bright red blood per rectum, melena, or hematemesis Neurologic:  No visual changes, wkns, changes in mental status. All other systems reviewed and are otherwise negative except as noted above.  Physical Exam    VS:  BP (!) 151/89 (BP Location: Left Arm, Patient Position: Sitting, Cuff Size: Normal)   Pulse (!) 51   Ht  (1.651 m)   Wt 192 lb 6.4 oz (87.3 kg)   LMP  (LMP Unknown)   BMI 32.02 kg/m  , BMI Body mass index is 32.02 kg/m. GEN: Well nourished, well developed, in no acute distress. HEENT: normal. Neck: Supple, no JVD, carotid bruits, or masses. Cardiac: RRR, no murmurs, rubs, or gallops. No clubbing, cyanosis, edema.  Radials/DP/PT 2+ and equal bilaterally.  Respiratory:  Respirations regular and unlabored, clear to auscultation bilaterally. GI: Soft, nontender, nondistended, BS + x 4. MS: no deformity or atrophy. Skin: warm and dry, no rash. Neuro:  Strength and sensation are intact. Psych: Normal affect.  Accessory Clinical Findings    ECG personally reviewed by me today-sinus bradycardia 52 bpm- No acute changes  EKG 04/16/2019 Sinus bradycardia LVH 58 bpm  Echocardiogram 12/07/2017 Study Conclusions  - Left ventricle: The cavity size was normal. There was mild  focal   basal hypertrophy of the septum. Systolic function was vigorous.   The estimated ejection fraction was in the range of 65% to 70%.   Wall motion was normal; there were no regional wall motion   abnormalities. Doppler parameters are consistent with abnormal   left ventricular relaxation (grade 1 diastolic dysfunction). - Aortic valve: Trileaflet; mildly thickened, mildly calcified   leaflets.  Cardiac event monitor 06/10/2019 Sinus bradycardia, normal sinus rhythm, PVCs, 4 and 5 beats of nonsustained ventricular  tachycardia.  Cardiac catheterization 06/01/2015  Prox RCA lesion, 10% stenosed.  Prox Cx lesion, 10% stenosed.  Prox LAD to Dist LAD lesion, 10% stenosed.  The left ventricular systolic function is normal.   1. Mild non-obstructive CAD 2. Normal LV systolic function 3. Non-cardiac chest pain  Recommendations: Medical management of mild CAD.   Assessment & Plan   1.  Chest discomfort-indicates that today she had chest discomfort from 11 AM to about 1:30 PM.  None currently.  She did not take anything for this chest discomfort.  She described as chest pain on her left side radiating to her elbow.  It resolved with rest.  She states that this is not uncommon for her and she has this chest discomfort around every other month.  It is not associated with exertion.  Cardiac catheterization 05/2015 showed mild nonobstructive coronary artery disease, normal LV function and CP was believed to be noncardiac.  This chest discomfort appears to be noncardiac as well.  EKG shows sinus bradycardia 51 bpm Start amlodipine 2.5 mg daily Increase physical activity-walk 10 minutes daily  Heart healthy low-sodium diet-salty 6 given She has been instructed to present to the emergency department if her chest discomfort is prolonged or increases intensity in the future.  Palpitations/dizziness-no dizziness or palpitations today.  Long history of palpitations and presyncope.  Cardiac event monitor 05/2019 sinus bradycardia, normal sinus rhythm, PVCs, 4 and 5 beats of nonsustained VT.  Patient started onMetoprolol succinate 25 mg daily 06/25/2019. Continue metoprolol succinate 25 mg tablet daily Avoid triggers-caffeine, chocolate, EtOH, etc.  Essential hypertension-BP today 151/89 Continue losartan 50 mg tablet daily Continue metoprolol succinate 25 mg tablet daily Heart healthy low-sodium diet-salty 6 given Increase physical activity as tolerated Keep blood pressure log and bring to next  appointment  Hyperlipidemia-LDL 69 05/15/2017 Continue continue atorvastatin 40 mg tablet daily Heart healthy high-fiber low-sodium diet Increase physical activity as tolerated Repeat fasting lipid with next appointment  History of cerebrovascular accident-2006 event Continue clopidogrel 75 mg tablet daily  Disposition: Follow-up with me in 1 month.  Thomasene Ripple. Dusty Raczkowski NP-C       [facility] [address] Office [phone] Fax [phone]

## 2019-09-06 NOTE — Patient Instructions (Signed)
Medication Instructions:  START AMLODIPINE 2.5MG  DAILY If you need a refill on your cardiac medications before your next appointment, please call your pharmacy.  Labwork: FASTING LIPID PANEL-1 WEEK BEFORE FOLLOW UP APPOINTMENT HERE IN OUR OFFICE AT [facility]    TAKE AND LOG YOUR BLOOD PRESSURE DAILY  You will need to fast. DO NOT EAT OR DRINK PAST MIDNIGHT.       If you have labs (blood work) drawn today and your tests are completely normal, you will receive your results only by: Marland Kitchen MyChart Message (if you have MyChart) OR . A paper copy in the mail If you have any lab test that is abnormal or we need to change your treatment, we will call you to review the results.  Special Instructions: PLEASE START WALKING 10 MINUTES DAILY WITH A GOAL OF 150 MIN/WEEK  PLEASE READ AND FOLLOW SALTY 6 ATTACHED  Reduce your risk of getting COVID-19 With your heart disease it is especially important for people at increased risk of severe illness from COVID-19, and those who live with them, to protect themselves from getting COVID-19. The best way to protect yourself and to help reduce the spread of the virus that causes COVID-19 is to: Marland Kitchen Limit your interactions with other people as much as possible. . Take precautions to prevent getting COVID-19 when you do interact with others. If you start feeling sick and think you may have COVID-19, get in touch with your healthcare provider within 24 hours.  Follow-Up: IN 1 MONTH  In Person You may see Kirk Ruths, MD Coletta Memos or one of the following Advanced Practice Providers on your designated Care Team: Kerin Ransom, PA-C [place], [place]  Coletta Memos, [place].    At [facility], you and your health needs are our priority.  As part of our continuing mission to provide you with exceptional heart care, we have created designated Provider Care Teams.  These Care Teams include your primary Cardiologist (physician) and Advanced Practice Providers (APPs -   Physician Assistants and Nurse Practitioners) who all work together to provide you with the care you need, when you need it.  Thank you for choosing [facility] at [facility]!!     Happy Holidays!!

## 2019-09-25 ENCOUNTER — Other Ambulatory Visit: Payer: Self-pay | Admitting: Family Medicine

## 2019-10-01 ENCOUNTER — Telehealth: Payer: Self-pay | Admitting: *Deleted

## 2019-10-01 NOTE — Telephone Encounter (Signed)
I think EVERYONE should get the vaccine  

## 2019-10-01 NOTE — Telephone Encounter (Signed)
Copied from CRM [id number]. Topic: General - Other >> Oct 01, 2019  9:11 AM Daphine Deutscher D wrote: Reason for CRM: Pt called wanting to know from dr. Clent Ridges would give her advise on taking the Covid virus vaccine.  She has had blood clots and a stroke.  CB#  [phone] or [phone]

## 2019-10-02 ENCOUNTER — Ambulatory Visit: Payer: Medicare Other | Admitting: Adult Health

## 2019-10-02 DIAGNOSIS — I639 Cerebral infarction, unspecified: Secondary | ICD-10-CM | POA: Diagnosis not present

## 2019-10-02 DIAGNOSIS — E7800 Pure hypercholesterolemia, unspecified: Secondary | ICD-10-CM | POA: Diagnosis not present

## 2019-10-02 DIAGNOSIS — Z79899 Other long term (current) drug therapy: Secondary | ICD-10-CM | POA: Diagnosis not present

## 2019-10-02 DIAGNOSIS — I10 Essential (primary) hypertension: Secondary | ICD-10-CM | POA: Diagnosis not present

## 2019-10-02 DIAGNOSIS — R002 Palpitations: Secondary | ICD-10-CM | POA: Diagnosis not present

## 2019-10-02 NOTE — Telephone Encounter (Signed)
Patient is aware 

## 2019-10-03 LAB — LIPID PANEL
Chol/HDL Ratio: 2.2 ratio (ref 0.0–4.4)
Cholesterol, Total: 161 mg/dL (ref 100–199)
HDL: 74 mg/dL (ref >39)
LDL Chol Calc (NIH): 75 mg/dL (ref 0–99)
Triglycerides: 60 mg/dL (ref 0–149)
VLDL Cholesterol Cal: 12 mg/dL (ref 5–40)

## 2019-10-08 NOTE — Progress Notes (Signed)
Cardiology Clinic Note   Patient Name: ZYNIAH FERRAIOLO Date of Encounter: 10/09/2019  Primary Care Provider:  Nelwyn Salisbury, MD Primary Cardiologist:  [name], MD  Patient Profile    Abigail [name] 74 year old female presents today for a follow-up evaluation of her palpitations and chest discomfort.  Past Medical History    Past Medical History:  Diagnosis Date  . Bronchitis 09/2016  . Bruises easily   . DDD (degenerative disc disease)   . Degenerative joint disease of cervical spine 09-05-11   Cervival area, now some osteoarthritis-lower back and Rt. shoulder  . Depression   . Diverticulitis 09-05-11   hx. gastritis, diverticulitis x2 -now surgery planned  . DIVERTICULITIS, ACUTE 02/10/2010  . Dyspnea    with exertion  . Essential hypertension 09-05-11   tx. Verapamil  . GROIN PAIN 09/21/2010  . Headache(784.0)    headaches are better  . Hearing loss    right ear  . Heart palpitations   . Hemorrhoids   . History of bronchitis   . History of colonic polyps   . History of pneumonia   . History of tension headache   . Hyperlipidemia   . Hypertension   . Hypothyroidism 09-05-11   Supplement used  . Late effect of adverse effect of drug, medicinal or biological substance   . NECK PAIN, ACUTE 09/21/2010  . Neuromuscular disorder (HCC)    carpal tunnel in both hands  . Nocturia   . Osteoarthritis 112-17-12   spine and rt. hip, rt. shoulder  . Patent foramen ovale    Small - unable to be closed -   . Pneumonia   . PONV (postoperative nausea and vomiting)   . Sleep apnea 09-05-11   no cpap ever, had surgery to remove cartilage, no problems now  . SORE THROAT 04/30/2009  . Stroke (HCC) 09-05-11   2006/2009-(loss of memory, balance issues remains occ.)  . TIA 09/28/2007, 10/2014  . Unstable angina (HCC)    a. 05/2010 Cath: nl cors, EF 55%;  b. 05/2015 Lexiscan MV: small, severe, fixed apical defect and a small, severe, reversible inf lateral defect w/ apical thinning and  mild ischemia, EF 54%.  Marland Kitchen URI 09/02/2008  . Vertigo    Past Surgical History:  Procedure Laterality Date  . ABDOMINAL HYSTERECTOMY    . BACK SURGERY    . CARDIAC CATHETERIZATION N/A 06/01/2015   Procedure: Left Heart Cath and Coronary Angiography;  Surgeon: Kathleene Hazel, MD;  Location: [facility] INVASIVE CV LAB;  Service: Cardiovascular;  Laterality: N/A;  . COLON RESECTION  09/07/2011   Procedure: LAPAROSCOPIC SIGMOID COLON RESECTION;  Surgeon: Mariella Saa, MD;  Location: [facility] ORS;  Service: General;  Laterality: N/A;  with proctoscopy  . COLONOSCOPY  08/06/2015   per Dr. Kinnie Scales, adenomatous polyps, repeat in 5 yrs   . CYST EXCISION N/A 11/29/2016   Procedure: EXCISION UMBILICAL CYST;  Surgeon: Glenna Fellows, MD;  Location: [facility];  Service: General;  Laterality: N/A;  . ELBOW SURGERY  09-05-11   left elbow -ligament repair  . EYE SURGERY     cataract  . INSERTION OF MESH N/A 12/13/2013   Procedure: INSERTION OF MESH;  Surgeon: Mariella Saa, MD;  Location: [facility];  Service: General;  Laterality: N/A;  . LAPAROSCOPY  09/07/2011   Procedure: LAPAROSCOPY DIAGNOSTIC;  Surgeon: Roselle Locus II;  Location: [facility];  Service: Gynecology;  Laterality: N/A;  . MAXIMUM ACCESS (MAS)POSTERIOR LUMBAR INTERBODY FUSION (PLIF) 2  LEVEL N/A 05/03/2016   Procedure: L4-5 L5-S1 Maximum access posterior lumbar interbody fusion;  Surgeon: Maeola HarmanJoseph Stern, MD;  Location: [facility]URO OR[facility];  Service: Neurosurgery;  Laterality: N/A;  L4-5 L5-S1 Maximum access posterior lumbar interbody fusion  . SALPINGOOPHORECTOMY  09/07/2011   Procedure: SALPINGO OOPHERECTOMY;  Surgeon: Roselle LocusJames E Tomblin II;  Location: [facility];  Service: Gynecology;  Laterality: Right;  . THYROIDECTOMY, PARTIAL  09-05-11  . TOTAL HIP ARTHROPLASTY Right 08/21/2018   Procedure: TOTAL HIP ARTHROPLASTY ANTERIOR APPROACH;  Surgeon: Marcene Corningalldorf, Peter, MD;  Location: [facility] OR;  Service: Orthopedics;  Laterality: Right;  . TUBAL LIGATION  1983  .  UMBILICAL HERNIA REPAIR  yrs ago  . VENTRAL HERNIA REPAIR  06/20/2012   Procedure: LAPAROSCOPIC VENTRAL HERNIA;  Surgeon: Mariella SaaBenjamin T Hoxworth, MD;  Location: [facility];  Service: General;  Laterality: N/A;  Laparoscopic Repair of Ventral Hernia with mesh  . VENTRAL HERNIA REPAIR N/A 12/13/2013   Procedure: LAPAROSCOPIC REPAIR RECURRENT VENTRAL INCISIONAL  HERNIA;  Surgeon: Mariella SaaBenjamin T Hoxworth, MD;  Location: [facility];  Service: General;  Laterality: N/A;    Allergies  Allergies  Allergen Reactions  . Dilaudid [Hydromorphone Hcl] Shortness Of Breath, Nausea And Vomiting and Other (See Comments)    Able to take morphine without issue  . Shellfish Allergy Shortness Of Breath and Rash    Only shrimp allergy  . Aggrenox [Aspirin-Dipyridamole Er] Nausea And Vomiting and Other (See Comments)    Headache   . Cephalexin Itching and Rash  . Codeine Phosphate Nausea And Vomiting  . Hydrocodone Nausea And Vomiting and Other (See Comments)    Can take morphine without issue  . Meperidine Hcl Nausea And Vomiting  . Percocet [Oxycodone-Acetaminophen] Nausea And Vomiting and Other (See Comments)    Can take morphine without issue    History of Present Illness    [name] has a long history of palpitations and presyncope.  She was noted to have a PFO on TEE echocardiogram in 2006 following CVA.  An event monitor in 2011 for palpitations showed sinus with PACs.  And nuclear study 05/2015 showed apical thinning and mild ischemia in the inferior lateral wall.  A cardiac catheterization on 05/2015 showed nonobstructive coronary artery disease and normal LV function.  Her carotid Dopplers that were done on 6/18 showed 1-39 bilateral stenosis.  An echocardiogram 11/2017 showed normal LV function and mild diastolic dysfunction.  She had a recent acute DVT following hip surgery 2020.  She was last seen by Dr. Jens Somrenshaw on 04/16/2019.  During that time she described palpitations associated with dizziness that would last  several minutes at a time.  She described the episodes as sudden in onset.  She did have some dyspnea on exertion but no chest pain at that time.  She contacted the triage nurse on 08/02/2019 with vertigo type symptoms.  Her blood pressures were high 130s to 150s over 80s with heart rates in the high 50s to low 70s.  She presented to the clinic 09/06/2019 and stated she had not had any dizziness or noticed any palpitations since she contacted the office 1 month prior.  She stated she had been very sedentary with the COVID-19 pandemic.  She stated that her diet was poor.  She did not eat very much and enjoyed things such as biscuits.  She indicated that for 2-1/2 hours that day she had chest discomfort on her left side that radiated down her left arm to her elbow.  She stated this was mild  in nature and went away with laying down/rest.  She stated that she had this type of chest discomfort every other month.  Her chest discomfort was nonexertional in nature and came on while she was watching television.  This chest discomfort was not reproducible in the clinic.  It did not appear to be cardiac in nature.  I started her on amlodipine 2.5, reevaluated her lipids, and encourage her to eat a low-sodium heart healthy diet.  She presents to the clinic today and states she has only had 2 episodes of brief chest discomfort in the past month.  She is pleased about how infrequent her chest discomfort was at this time.  She states that she does have some increased work of breathing with more strenuous activities like climbing into her bed.  She does not notice any increased work of breathing with routine activities.  She has been trying to walk for 10 minutes a day however, she has limited due to her back pain.  She states that she will follow up with her neurosurgeon but does not wish to undergo surgery again.  She also states that she would like to lose about 20 to 25 pounds because she feels this will help with her  discomfort.  I have encouraged her to limit her carbohydrates, and eat a heart healthy diet.  I will give her the salty 6 today and have her follow-up with Dr. Jens Som in 6 months.  She denies, lower extremity edema, fatigue, palpitations, melena, hematuria, hemoptysis, diaphoresis, weakness, presyncope, syncope, orthopnea, and PND.  Home Medications    Prior to Admission medications   Medication Sig Start Date End Date Taking? Authorizing Provider  amLODipine (NORVASC) 2.5 MG tablet Take 1 tablet (2.5 mg total) by mouth daily. 09/06/19 12/05/19  Ronney Asters, NP  atorvastatin (LIPITOR) 40 MG tablet Take 1 tablet (40 mg total) by mouth daily. 11/27/18   Nelwyn Salisbury, MD  butalbital-acetaminophen-caffeine (FIORICET) [phone] MG tablet TAKE 1 TABLET BY MOUTH EVERY 6 HOURS AS NEEDED FOR HEADACHE 04/12/19   Nelwyn Salisbury, MD  clopidogrel (PLAVIX) 75 MG tablet Take 75 mg by mouth daily. 03/12/19   [provider]  levothyroxine (SYNTHROID) 125 MCG tablet TAKE 1 TABLET (125 MCG TOTAL) BY MOUTH DAILY BEFORE BREAKFAST. 09/25/19   Nelwyn Salisbury, MD  losartan (COZAAR) 50 MG tablet TAKE 1 TABLET BY MOUTH EVERY DAY 07/15/19   Nelwyn Salisbury, MD  Menthol, Topical Analgesic, (ASPERCREME MAX ROLL-ON EX) Apply 1 application topically daily as needed (back pain).    [provider]  metoprolol succinate (TOPROL XL) 25 MG 24 hr tablet Take 1 tablet (25 mg total) by mouth daily. 06/14/19   Lewayne Bunting, MD  potassium chloride (K-DUR) 10 MEQ tablet Take 1 tablet (10 mEq total) by mouth daily. 09/20/18   Nelwyn Salisbury, MD  tetrahydrozoline 0.05 % ophthalmic solution Place 1 drop into both eyes 4 (four) times daily as needed (for dry/irritated eyes.).    [provider]  traMADol (ULTRAM) 50 MG tablet Take 2 tablets (100 mg total) by mouth every 6 (six) hours as needed for moderate pain or severe pain. TAKE 1 TO 2 TABLETS BY MOUTH EVERY 4 TO 6 HOURS AS NEEDED FOR PAIN 06/17/19   Nelwyn Salisbury, MD    Family History    Family History  Problem Relation Age of Onset  . Arrhythmia Mother   . Hypertension Mother   . Stroke Mother   .  Stroke Maternal Grandmother   . Diabetes Paternal Grandmother   . Breast cancer Sister        78s  . Breast cancer Sister        51s   She indicated that her mother is deceased. She indicated that her father is deceased. She indicated that her maternal grandmother is deceased. She indicated that her maternal grandfather is deceased. She indicated that her paternal grandmother is deceased. She indicated that her paternal grandfather is deceased.  Social History    Social History   Socioeconomic History  . Marital status: Married    Spouse name: Not on file  . Number of children: Not on file  . Years of education: Not on file  . Highest education level: Not on file  Occupational History  . Not on file  Tobacco Use  . Smoking status: Former Smoker    Packs/day: 0.50    Years: 5.00    Pack years: 2.50    Types: Cigarettes    Quit date: 09/19/1968    Years since quitting: 51.0  . Smokeless tobacco: Never Used  Substance and Sexual Activity  . Alcohol use: Yes    Alcohol/week: 2.0 standard drinks    Types: 1 Cans of beer, 1 Shots of liquor per week    Comment: rarely  . Drug use: No  . Sexual activity: Yes  Other Topics Concern  . Not on file  Social History Narrative   Lives in [place] with her husband.  She does not routinely exercise.   Social Determinants of Health   Financial Resource Strain:   . Difficulty of Paying Living Expenses: Not on file  Food Insecurity:   . Worried About Charity fundraiser in the Last Year: Not on file  . Ran Out of Food in the Last Year: Not on file  Transportation Needs:   . Lack of Transportation (Medical): Not on file  . Lack of Transportation (Non-Medical): Not on file  Physical Activity:   . Days of Exercise per Week: Not on file  . Minutes of Exercise per Session: Not on file   Stress:   . Feeling of Stress : Not on file  Social Connections:   . Frequency of Communication with Friends and Family: Not on file  . Frequency of Social Gatherings with Friends and Family: Not on file  . Attends Religious Services: Not on file  . Active Member of Clubs or Organizations: Not on file  . Attends Archivist Meetings: Not on file  . Marital Status: Not on file  Intimate Partner Violence:   . Fear of Current or Ex-Partner: Not on file  . Emotionally Abused: Not on file  . Physically Abused: Not on file  . Sexually Abused: Not on file     Review of Systems    General:  No chills, fever, night sweats or weight changes.  Cardiovascular:  No chest pain, dyspnea on exertion, edema, orthopnea, palpitations, paroxysmal nocturnal dyspnea. Dermatological: No rash, lesions/masses Respiratory: No cough, dyspnea Urologic: No hematuria, dysuria Abdominal:   No nausea, vomiting, diarrhea, bright red blood per rectum, melena, or hematemesis Neurologic:  No visual changes, wkns, changes in mental status. All other systems reviewed and are otherwise negative except as noted above.  Physical Exam    VS:  BP 134/78   Pulse (!) 58   Ht 5\' 5"  (1.651 m)   Wt 194 lb 12.8 oz (88.4 kg)   LMP  (LMP Unknown)  SpO2 97%   BMI 32.42 kg/m  , BMI Body mass index is 32.42 kg/m. GEN: Well nourished, well developed, in no acute distress. HEENT: normal. Neck: Supple, no JVD, carotid bruits, or masses. Cardiac: RRR, no murmurs, rubs, or gallops. No clubbing, cyanosis, edema.  Radials/DP/PT 2+ and equal bilaterally.  Respiratory:  Respirations regular and unlabored, clear to auscultation bilaterally. GI: Soft, nontender, nondistended, BS + x 4. MS: no deformity or atrophy. Skin: warm and dry, no rash. Neuro:  Strength and sensation are intact. Psych: Normal affect.  Accessory Clinical Findings    ECG personally reviewed by me today-none today.  EKG 09/06/2019 sinus  bradycardia 52 bpm- No acute changes  EKG 04/16/2019 Sinus bradycardia LVH 58 bpm  Echocardiogram 12/07/2017 Study Conclusions  - Left ventricle: The cavity size was normal. There was mild focal basal hypertrophy of the septum. Systolic function was vigorous. The estimated ejection fraction was in the range of 65% to 70%. Wall motion was normal; there were no regional wall motion abnormalities. Doppler parameters are consistent with abnormal left ventricular relaxation (grade 1 diastolic dysfunction). - Aortic valve: Trileaflet; mildly thickened, mildly calcified leaflets.  Cardiac event monitor 06/10/2019 Sinus bradycardia, normal sinus rhythm, PVCs, 4 and 5 beats of nonsustained ventricular tachycardia.  Cardiac catheterization 06/01/2015  Prox RCA lesion, 10% stenosed.  Prox Cx lesion, 10% stenosed.  Prox LAD to Dist LAD lesion, 10% stenosed.  The left ventricular systolic function is normal.  1. Mild non-obstructive CAD 2. Normal LV systolic function 3. Non-cardiac chest pain  Recommendations: Medical management of mild CAD.  Assessment & Plan   1.  Chest discomfort-indicates that today she does not have chest discomfort.   Cardiac catheterization 05/2015 showed mild nonobstructive coronary artery disease, normal LV function and CP was believed to be noncardiac.  This chest discomfort appears to be noncardiac as well.  EKG shows sinus bradycardia 51 bpm.  She has only had 2 episodes of chest discomfort in the last month with the addition of CCB. Continue amlodipine 2.5 mg daily Increase physical activity-walk 10 minutes 3-4 times per week Heart healthy low-sodium diet-salty 6 given  Palpitations/dizziness-no dizziness or palpitations.  Long history of palpitations and presyncope.  Cardiac event monitor 05/2019 sinus bradycardia, normal sinus rhythm, PVCs, 4 and 5 beats of nonsustained VT.  Patient started onMetoprolol succinate 25 mg daily  06/25/2019. Continue metoprolol succinate 25 mg tablet daily Avoid triggers-caffeine, chocolate, EtOH, etc.  Essential hypertension-BP today 134/78.  Better control at home mid 130s-140s systolic and 80s diastolic. Continue losartan 50 mg tablet daily Continue metoprolol succinate 25 mg tablet daily Continue amlodipine 2.5 mg daily Heart healthy low-sodium diet-salty 6 given Increase physical activity as tolerated Keep blood pressure log and bring to next appointment Continue weight loss  Hyperlipidemia-LDL 75 10/02/19 Continue continue atorvastatin 40 mg tablet daily Heart healthy high-fiber low-sodium diet Increase physical activity as tolerated   History of cerebrovascular accident-2006 event Continue clopidogrel 75 mg tablet daily  Disposition: Follow-up with Dr. Jens Som 6 months.    Thomasene Ripple. Kierah Goatley NP-C      [facility] [address] Office [phone] Fax [phone]

## 2019-10-09 ENCOUNTER — Other Ambulatory Visit: Payer: Self-pay

## 2019-10-09 ENCOUNTER — Ambulatory Visit (INDEPENDENT_AMBULATORY_CARE_PROVIDER_SITE_OTHER): Payer: Medicare PPO | Admitting: General Practice

## 2019-10-09 ENCOUNTER — Encounter: Payer: Self-pay | Admitting: General Practice

## 2019-10-09 VITALS — BP 134/78 | HR 58 | Ht 65.0 in | Wt 194.8 lb

## 2019-10-09 DIAGNOSIS — I10 Essential (primary) hypertension: Secondary | ICD-10-CM | POA: Diagnosis not present

## 2019-10-09 DIAGNOSIS — R072 Precordial pain: Secondary | ICD-10-CM

## 2019-10-09 DIAGNOSIS — I639 Cerebral infarction, unspecified: Secondary | ICD-10-CM | POA: Diagnosis not present

## 2019-10-09 DIAGNOSIS — E7800 Pure hypercholesterolemia, unspecified: Secondary | ICD-10-CM

## 2019-10-09 DIAGNOSIS — R002 Palpitations: Secondary | ICD-10-CM

## 2019-10-09 NOTE — Patient Instructions (Signed)
Medication Instructions:  Your physician recommends that you continue on your current medications as directed. Please refer to the Current Medication list given to you today.  *If you need a refill on your cardiac medications before your next appointment, please call your pharmacy*   Follow-Up: At [facility], you and your health needs are our priority.  As part of our continuing mission to provide you with exceptional heart care, we have created designated Provider Care Teams.  These Care Teams include your primary Cardiologist (physician) and Advanced Practice Providers (APPs -  Physician Assistants and Nurse Practitioners) who all work together to provide you with the care you need, when you need it.  Your next appointment:   6 month(s)  The format for your next appointment:   Either In Person or Virtual  Provider:   You may see [name], MD or one of the following Advanced Practice Providers on your designated Care Team:    Corine Shelter, PA-C  [place], [place]  Edd Fabian, [place]

## 2019-11-19 ENCOUNTER — Other Ambulatory Visit: Payer: Self-pay | Admitting: Family Medicine

## 2019-11-19 DIAGNOSIS — Z1231 Encounter for screening mammogram for malignant neoplasm of breast: Secondary | ICD-10-CM

## 2019-12-02 DIAGNOSIS — N760 Acute vaginitis: Secondary | ICD-10-CM | POA: Diagnosis not present

## 2019-12-02 DIAGNOSIS — Z6832 Body mass index (BMI) 32.0-32.9, adult: Secondary | ICD-10-CM | POA: Diagnosis not present

## 2019-12-02 DIAGNOSIS — Z124 Encounter for screening for malignant neoplasm of cervix: Secondary | ICD-10-CM | POA: Diagnosis not present

## 2019-12-02 DIAGNOSIS — N898 Other specified noninflammatory disorders of vagina: Secondary | ICD-10-CM | POA: Diagnosis not present

## 2019-12-06 ENCOUNTER — Telehealth (INDEPENDENT_AMBULATORY_CARE_PROVIDER_SITE_OTHER): Payer: Medicare PPO | Admitting: Family Medicine

## 2019-12-06 ENCOUNTER — Encounter: Payer: Self-pay | Admitting: Family Medicine

## 2019-12-06 VITALS — BP 121/79 | HR 69 | Ht 65.0 in | Wt 190.0 lb

## 2019-12-06 DIAGNOSIS — K5792 Diverticulitis of intestine, part unspecified, without perforation or abscess without bleeding: Secondary | ICD-10-CM

## 2019-12-06 MED ORDER — METRONIDAZOLE 500 MG PO TABS: 500.0000 mg | ORAL_TABLET | Freq: Three times a day (TID) | ORAL | 0 refills | Status: DC

## 2019-12-06 MED ORDER — CIPROFLOXACIN HCL 500 MG PO TABS: 500.0000 mg | ORAL_TABLET | Freq: Two times a day (BID) | ORAL | 0 refills | Status: DC

## 2019-12-06 NOTE — Progress Notes (Signed)
Virtual Visit via Telephone Note  I connected with the patient on 12/06/19 at  2:15 PM EDT by telephone and verified that I am speaking with the correct person using two identifiers.   I discussed the limitations, risks, security and privacy concerns of performing an evaluation and management service by telephone and the availability of in person appointments. I also discussed with the patient that there may be a patient responsible charge related to this service. The patient expressed understanding and agreed to proceed.  Location patient: home Location provider: work or home office Participants present for the call: patient, provider Patient did not have a visit in the prior 7 days to address this/these issue(s).   History of Present Illness: Here for what she thinks is another bout of diverticulitis. For the past 2 days she has had LLQ abdominal pains with diarrhea. No nausea or vomiting, no fever. No urinary symptoms. Her last bout was in [place] 2019. She is drinking plenty of fluids.    Observations/Objective: Patient sounds cheerful and well on the phone. I do not appreciate any SOB. Speech and thought processing are grossly intact. Patient reported vitals:  Assessment and Plan: Diverticulitis, treat with Flagyl and Cipro. Recheck as needed.  Gershon Crane, MD   Follow Up Instructions:     [phone] 5-10 [phone] 11-20 [id number] 21-30 I did not refer this patient for an OV in the next 24 hours for this/these issue(s).  I discussed the assessment and treatment plan with the patient. The patient was provided an opportunity to ask questions and all were answered. The patient agreed with the plan and demonstrated an understanding of the instructions.   The patient was advised to call back or seek an in-person evaluation if the symptoms worsen or if the condition fails to improve as anticipated.  I provided 14 minutes of non-face-to-face time during this encounter.   Gershon Crane, MD

## 2019-12-09 ENCOUNTER — Other Ambulatory Visit (HOSPITAL_COMMUNITY): Payer: Self-pay | Admitting: Orthopaedic Surgery

## 2019-12-09 DIAGNOSIS — M7989 Other specified soft tissue disorders: Secondary | ICD-10-CM

## 2019-12-09 DIAGNOSIS — M7061 Trochanteric bursitis, right hip: Secondary | ICD-10-CM | POA: Diagnosis not present

## 2019-12-09 DIAGNOSIS — M79604 Pain in right leg: Secondary | ICD-10-CM

## 2019-12-12 ENCOUNTER — Ambulatory Visit (HOSPITAL_COMMUNITY)
Admission: RE | Admit: 2019-12-12 | Discharge: 2019-12-12 | Disposition: A | Discharge status: Home / Self Care | Payer: Medicare PPO | Source: Ambulatory Visit | Attending: Orthopaedic Surgery | Admitting: Orthopaedic Surgery

## 2019-12-12 ENCOUNTER — Other Ambulatory Visit: Payer: Self-pay

## 2019-12-12 DIAGNOSIS — M7989 Other specified soft tissue disorders: Secondary | ICD-10-CM | POA: Diagnosis not present

## 2019-12-12 DIAGNOSIS — M79604 Pain in right leg: Secondary | ICD-10-CM

## 2019-12-12 NOTE — Progress Notes (Signed)
Right lower extremity venous duplex exam completed.  Preliminary results can be found under CV proc under chart review.  12/12/2019 2:32 PM  Narely Nobles, K., RDMS, RVT

## 2019-12-13 ENCOUNTER — Telehealth: Payer: Self-pay

## 2019-12-17 DIAGNOSIS — M5416 Radiculopathy, lumbar region: Secondary | ICD-10-CM | POA: Diagnosis not present

## 2019-12-18 ENCOUNTER — Other Ambulatory Visit: Payer: Self-pay

## 2019-12-18 ENCOUNTER — Ambulatory Visit
Admission: RE | Admit: 2019-12-18 | Discharge: 2019-12-18 | Disposition: A | Discharge status: Home / Self Care | Payer: Medicare PPO | Source: Ambulatory Visit | Attending: Family Medicine | Admitting: Family Medicine

## 2019-12-18 DIAGNOSIS — Z1231 Encounter for screening mammogram for malignant neoplasm of breast: Secondary | ICD-10-CM | POA: Diagnosis not present

## 2019-12-31 ENCOUNTER — Other Ambulatory Visit: Payer: Self-pay

## 2019-12-31 ENCOUNTER — Other Ambulatory Visit: Payer: Self-pay | Admitting: Family Medicine

## 2020-01-01 ENCOUNTER — Ambulatory Visit (INDEPENDENT_AMBULATORY_CARE_PROVIDER_SITE_OTHER): Payer: Medicare PPO | Admitting: Family Medicine

## 2020-01-01 ENCOUNTER — Encounter: Payer: Self-pay | Admitting: Family Medicine

## 2020-01-01 ENCOUNTER — Ambulatory Visit: Payer: Medicare PPO | Admitting: Family Medicine

## 2020-01-01 VITALS — BP 142/76 | HR 63 | Temp 97.6°F | Wt 196.6 lb

## 2020-01-01 DIAGNOSIS — I6381 Other cerebral infarction due to occlusion or stenosis of small artery: Secondary | ICD-10-CM | POA: Diagnosis not present

## 2020-01-01 DIAGNOSIS — M5440 Lumbago with sciatica, unspecified side: Secondary | ICD-10-CM | POA: Diagnosis not present

## 2020-01-01 DIAGNOSIS — M1611 Unilateral primary osteoarthritis, right hip: Secondary | ICD-10-CM | POA: Diagnosis not present

## 2020-01-01 DIAGNOSIS — G8929 Other chronic pain: Secondary | ICD-10-CM | POA: Diagnosis not present

## 2020-01-01 DIAGNOSIS — I824Z1 Acute embolism and thrombosis of unspecified deep veins of right distal lower extremity: Secondary | ICD-10-CM

## 2020-01-01 DIAGNOSIS — M159 Polyosteoarthritis, unspecified: Secondary | ICD-10-CM | POA: Diagnosis not present

## 2020-01-01 NOTE — Progress Notes (Signed)
   Subjective:    Patient ID: Abigail [name], female    DOB: 12/03/1945, 74 y.o.   MRN: [id number]  HPI Here to discuss several issues. First she has been seeing Dr. Yisroel Ramming for low back and left leg pain. He sent her for a venous doppler on 12-12-19 that showed a chronic DVT in the right peroneal vein but no acute problems. He was wanting to set her up for an epidural steroid injection to the spine with Dr. Modesto Charon, but they wanted our permission to briefly stop the Plavix first. In fact esrlier this week we did fax them a form stating that I approved stopping the Plavix for 3 days prior to the procedure and then restarting it the next day. Abigail [name] has a good deal of pain in the leg, and this makes sleeping very difficult. We had given her some Tramadol last September, but she says she forgot about it and has not been using it.    Review of Systems  Constitutional: Negative.   Respiratory: Negative.   Cardiovascular: Positive for leg swelling. Negative for chest pain and palpitations.  Musculoskeletal: Positive for back pain and myalgias.       Objective:   Physical Exam Constitutional:      Appearance: Normal appearance.     Comments: In pain, she walks with a cane   Cardiovascular:     Rate and Rhythm: Normal rate.     Pulses: Normal pulses.     Heart sounds: Normal heart sounds.  Pulmonary:     Effort: Pulmonary effort is normal.     Breath sounds: Normal breath sounds.  Musculoskeletal:     Comments: She is very tender over the right greater trochanter and in the body of the right thigh. The right calf is not swollen and not tender. She has some mild swelling and tenderness around the right ankle.   Neurological:     Mental Status: She is alert.           Assessment & Plan:  She has several problems going on at the same time. She has greater trochanteric bursitis. She also has osteoarthritis in the ankle. Also she likely has radicular pain in the leg from the spine. She will proceed  with the planned ESI. I reminded her that she can use Tramadol for pain, and this would definitely help her sleep at night.  Gershon Crane, MD

## 2020-01-02 NOTE — Telephone Encounter (Signed)
Last filled 04/12/2019 Last OV 01/02/2020  Ok to fill?

## 2020-01-06 DIAGNOSIS — M5416 Radiculopathy, lumbar region: Secondary | ICD-10-CM | POA: Diagnosis not present

## 2020-01-10 ENCOUNTER — Other Ambulatory Visit: Payer: Self-pay | Admitting: Family Medicine

## 2020-02-01 ENCOUNTER — Other Ambulatory Visit: Payer: Self-pay | Admitting: Family Medicine

## 2020-02-03 NOTE — Telephone Encounter (Signed)
Last filled 06/17/2019 Last OV 01/01/2020  Ok to fill?

## 2020-02-04 DIAGNOSIS — M5416 Radiculopathy, lumbar region: Secondary | ICD-10-CM | POA: Diagnosis not present

## 2020-02-10 DIAGNOSIS — M1610 Unilateral primary osteoarthritis, unspecified hip: Secondary | ICD-10-CM | POA: Diagnosis not present

## 2020-02-10 DIAGNOSIS — M48061 Spinal stenosis, lumbar region without neurogenic claudication: Secondary | ICD-10-CM | POA: Diagnosis not present

## 2020-02-10 DIAGNOSIS — M4120 Other idiopathic scoliosis, site unspecified: Secondary | ICD-10-CM | POA: Diagnosis not present

## 2020-02-10 DIAGNOSIS — M5416 Radiculopathy, lumbar region: Secondary | ICD-10-CM | POA: Diagnosis not present

## 2020-02-23 ENCOUNTER — Other Ambulatory Visit: Payer: Self-pay | Admitting: Family Medicine

## 2020-02-25 ENCOUNTER — Other Ambulatory Visit: Payer: Self-pay | Admitting: Family Medicine

## 2020-03-02 DIAGNOSIS — M5416 Radiculopathy, lumbar region: Secondary | ICD-10-CM | POA: Diagnosis not present

## 2020-03-13 NOTE — Telephone Encounter (Signed)
Error

## 2020-03-25 DIAGNOSIS — M48061 Spinal stenosis, lumbar region without neurogenic claudication: Secondary | ICD-10-CM | POA: Diagnosis not present

## 2020-03-25 DIAGNOSIS — M5416 Radiculopathy, lumbar region: Secondary | ICD-10-CM | POA: Diagnosis not present

## 2020-03-25 DIAGNOSIS — M545 Low back pain: Secondary | ICD-10-CM | POA: Diagnosis not present

## 2020-03-25 DIAGNOSIS — M4120 Other idiopathic scoliosis, site unspecified: Secondary | ICD-10-CM | POA: Diagnosis not present

## 2020-03-31 ENCOUNTER — Other Ambulatory Visit: Payer: Self-pay

## 2020-04-01 ENCOUNTER — Other Ambulatory Visit: Payer: Self-pay | Admitting: General Practice

## 2020-04-01 ENCOUNTER — Ambulatory Visit: Payer: Medicare PPO | Admitting: Family Medicine

## 2020-04-01 ENCOUNTER — Encounter: Payer: Self-pay | Admitting: Family Medicine

## 2020-04-01 VITALS — BP 132/80 | HR 56 | Temp 98.2°F | Wt 201.2 lb

## 2020-04-01 DIAGNOSIS — R739 Hyperglycemia, unspecified: Secondary | ICD-10-CM

## 2020-04-01 DIAGNOSIS — E039 Hypothyroidism, unspecified: Secondary | ICD-10-CM

## 2020-04-01 DIAGNOSIS — M5440 Lumbago with sciatica, unspecified side: Secondary | ICD-10-CM

## 2020-04-01 DIAGNOSIS — G8929 Other chronic pain: Secondary | ICD-10-CM

## 2020-04-01 DIAGNOSIS — E7800 Pure hypercholesterolemia, unspecified: Secondary | ICD-10-CM | POA: Diagnosis not present

## 2020-04-01 DIAGNOSIS — I10 Essential (primary) hypertension: Secondary | ICD-10-CM

## 2020-04-01 MED ORDER — PREGABALIN 75 MG PO CAPS: 75.0000 mg | ORAL_CAPSULE | Freq: Every day | ORAL | 0 refills | Status: DC

## 2020-04-01 NOTE — Progress Notes (Signed)
   Subjective:    Patient ID: Abigail [name], female    DOB: Dec 27, 1945, 74 y.o.   MRN: [id number]  HPI Here for several issues. First she asks if she can have scar tissue in her abdomen from the numerous surgeries she has had (hysterectomy, ventral hernia repair x 2, and sigmoid colectomy). She often feels a "pulling"sensation in her abdomen when she bends over or moves a certain way. This is not really a pain but a pulling sensation. Her bowels move regularly. No distention or nausea. Second she is fasting and asks for lab work today because she has gained about 20 lbs over the past year. Finally she saw Dr. Venetia Maxon recently for her spinal stenosis and he gave her samples of Lyrica 75 mg to take at bedtime. She is hesitant to try this and she asks my opinion.    Review of Systems  Constitutional: Positive for unexpected weight change.  Respiratory: Negative.   Cardiovascular: Negative.   Gastrointestinal: Negative for abdominal distention, abdominal pain, anal bleeding, blood in stool, constipation, diarrhea, nausea, rectal pain and vomiting.  Musculoskeletal: Positive for back pain.       Objective:   Physical Exam Constitutional:      General: She is not in acute distress.    Comments: Walks with a cane   Cardiovascular:     Rate and Rhythm: Normal rate and regular rhythm.     Pulses: Normal pulses.     Heart sounds: Normal heart sounds.  Pulmonary:     Effort: Pulmonary effort is normal.     Breath sounds: Normal breath sounds.  Abdominal:     General: Abdomen is flat. Bowel sounds are normal. There is no distension.     Palpations: Abdomen is soft. There is no mass.     Tenderness: There is no guarding or rebound.     Hernia: No hernia is present.     Comments: Mildly tender in the central abdomen   Neurological:     Mental Status: She is alert.           Assessment & Plan:  I think it is very likely that she has some adhesions on her bowels, but we do not need to  intervene any time soon. I ex[plained that these cannot be seen on scans or Xrays and that we have to go by her symptoms. If she develops pain or signs of obstruction, we will investigate further. We will send her for labs today. I also advised her to try the Lyrica samples, since they may help with her pain control. Recheck prn. Gershon Crane, MD

## 2020-04-02 LAB — HEMOGLOBIN A1C
Hgb A1c MFr Bld: 5.7 % of total Hgb — ABNORMAL HIGH (ref <5.7)
Mean Plasma Glucose: 117 (calc)
eAG (mmol/L): 6.5 (calc)

## 2020-04-02 LAB — BASIC METABOLIC PANEL
BUN: 12 mg/dL (ref 7–25)
CO2: 26 mmol/L (ref 20–32)
Calcium: 9.6 mg/dL (ref 8.6–10.4)
Chloride: 107 mmol/L (ref 98–110)
Creat: 0.73 mg/dL (ref 0.60–0.93)
Glucose, Bld: 87 mg/dL (ref 65–99)
Potassium: 3.8 mmol/L (ref 3.5–5.3)
Sodium: 143 mmol/L (ref 135–146)

## 2020-04-02 LAB — HEPATIC FUNCTION PANEL
AG Ratio: 1.4 (calc) (ref 1.0–2.5)
ALT: 12 U/L (ref 6–29)
AST: 14 U/L (ref 10–35)
Albumin: 4.1 g/dL (ref 3.6–5.1)
Alkaline phosphatase (APISO): 119 U/L (ref 37–153)
Bilirubin, Direct: 0.1 mg/dL (ref 0.0–0.2)
Globulin: 3 g/dL (calc) (ref 1.9–3.7)
Indirect Bilirubin: 0.3 mg/dL (calc) (ref 0.2–1.2)
Total Bilirubin: 0.4 mg/dL (ref 0.2–1.2)
Total Protein: 7.1 g/dL (ref 6.1–8.1)

## 2020-04-02 LAB — CBC WITH DIFFERENTIAL/PLATELET
Absolute Monocytes: 451 cells/uL (ref 200–950)
Basophils Absolute: 32 cells/uL (ref 0–200)
Basophils Relative: 0.7 %
Eosinophils Absolute: 92 cells/uL (ref 15–500)
Eosinophils Relative: 2 %
HCT: 41 % (ref 35.0–45.0)
Hemoglobin: 13.6 g/dL (ref 11.7–15.5)
Lymphs Abs: 2102 cells/uL (ref 850–3900)
MCH: 30.2 pg (ref 27.0–33.0)
MCHC: 33.2 g/dL (ref 32.0–36.0)
MCV: 91.1 fL (ref 80.0–100.0)
MPV: 11.4 fL (ref 7.5–12.5)
Monocytes Relative: 9.8 %
Neutro Abs: 1923 cells/uL (ref 1500–7800)
Neutrophils Relative %: 41.8 %
Platelets: 145 10*3/uL (ref 140–400)
RBC: 4.5 10*6/uL (ref 3.80–5.10)
RDW: 12.2 % (ref 11.0–15.0)
Total Lymphocyte: 45.7 %
WBC: 4.6 10*3/uL (ref 3.8–10.8)

## 2020-04-02 LAB — LIPID PANEL
Cholesterol: 167 mg/dL (ref <200)
HDL: 77 mg/dL (ref >=50)
LDL Cholesterol (Calc): 74 mg/dL (calc)
Non-HDL Cholesterol (Calc): 90 mg/dL (calc) (ref <130)
Total CHOL/HDL Ratio: 2.2 (calc) (ref <5.0)
Triglycerides: 78 mg/dL (ref <150)

## 2020-04-02 LAB — TSH: TSH: 0.98 mIU/L (ref 0.40–4.50)

## 2020-04-02 LAB — T3, FREE: T3, Free: 2.5 pg/mL (ref 2.3–4.2)

## 2020-04-02 LAB — T4, FREE: Free T4: 1.4 ng/dL (ref 0.8–1.8)

## 2020-04-04 ENCOUNTER — Other Ambulatory Visit: Payer: Self-pay | Admitting: Family Medicine

## 2020-04-27 DIAGNOSIS — M5416 Radiculopathy, lumbar region: Secondary | ICD-10-CM | POA: Diagnosis not present

## 2020-04-27 DIAGNOSIS — M545 Low back pain: Secondary | ICD-10-CM | POA: Diagnosis not present

## 2020-04-27 DIAGNOSIS — M4120 Other idiopathic scoliosis, site unspecified: Secondary | ICD-10-CM | POA: Diagnosis not present

## 2020-04-27 DIAGNOSIS — M48061 Spinal stenosis, lumbar region without neurogenic claudication: Secondary | ICD-10-CM | POA: Diagnosis not present

## 2020-05-04 DIAGNOSIS — N760 Acute vaginitis: Secondary | ICD-10-CM | POA: Diagnosis not present

## 2020-05-21 ENCOUNTER — Other Ambulatory Visit: Payer: Self-pay

## 2020-05-22 ENCOUNTER — Ambulatory Visit: Payer: Medicare PPO | Admitting: Family Medicine

## 2020-05-22 ENCOUNTER — Encounter: Payer: Self-pay | Admitting: Family Medicine

## 2020-05-22 VITALS — BP 126/70 | HR 56 | Temp 99.2°F | Wt 200.4 lb

## 2020-05-22 DIAGNOSIS — M79672 Pain in left foot: Secondary | ICD-10-CM | POA: Diagnosis not present

## 2020-05-22 NOTE — Progress Notes (Signed)
   Subjective:    Patient ID: Abigail [name], female    DOB: [birth year], 74 y.o.   MRN: [id number]  HPI Here to make sure she does not have a new blood clot. 3 days ago when sitting at home she had the sudden onset of a severe pain in the top of her left foot. No recent trauma, no swelling, no redness or warmth in the foot. Then byu the next morning the pain was gone. She has had no pain at all in the past 24 hours. She still takes Plavix daily.    Review of Systems  Constitutional: Negative.   Respiratory: Negative.   Cardiovascular: Negative.        Objective:   Physical Exam Constitutional:      General: She is not in acute distress.    Appearance: Normal appearance.     Comments: Walks with a cane   Cardiovascular:     Rate and Rhythm: Normal rate and regular rhythm.     Pulses: Normal pulses.     Heart sounds: Normal heart sounds.  Pulmonary:     Effort: Pulmonary effort is normal.     Breath sounds: Normal breath sounds.  Musculoskeletal:     Comments: The left foot is normal on exam. No tenderness or swelling or warmth.   Neurological:     Mental Status: She is alert.           Assessment & Plan:  Transient left foot pain, this was likely a radicular pain from the lower spine. There is no evidence to indicate another DVT. She was reassured and she will follow up as needed.  Gershon Crane, MD

## 2020-05-23 ENCOUNTER — Other Ambulatory Visit: Payer: Self-pay | Admitting: Family Medicine

## 2020-06-03 ENCOUNTER — Other Ambulatory Visit: Payer: Self-pay | Admitting: Cardiology

## 2020-06-11 ENCOUNTER — Ambulatory Visit
Admission: EM | Admit: 2020-06-11 | Discharge: 2020-06-11 | Disposition: A | Discharge status: Home / Self Care | Payer: Medicare PPO | Attending: Emergency Medicine | Admitting: Emergency Medicine

## 2020-06-11 ENCOUNTER — Other Ambulatory Visit: Payer: Self-pay

## 2020-06-11 DIAGNOSIS — J069 Acute upper respiratory infection, unspecified: Secondary | ICD-10-CM

## 2020-06-11 DIAGNOSIS — Z1152 Encounter for screening for COVID-19: Secondary | ICD-10-CM

## 2020-06-11 MED ORDER — BENZONATATE 200 MG PO CAPS: 200.0000 mg | ORAL_CAPSULE | Freq: Three times a day (TID) | ORAL | 0 refills | Status: AC | PRN

## 2020-06-11 MED ORDER — FLUTICASONE PROPIONATE 50 MCG/ACT NA SUSP: 1.0000 | Freq: Every day | NASAL | 0 refills | Status: DC

## 2020-06-11 MED ORDER — AMOXICILLIN 500 MG PO CAPS: 500.0000 mg | ORAL_CAPSULE | Freq: Three times a day (TID) | ORAL | 0 refills | Status: AC

## 2020-06-11 NOTE — ED Triage Notes (Signed)
Pt states she has had a cough, congestion, and headache since Sunday that has continued to worsen. Pt states no fevers. Pt is aox4 and ambulatory.

## 2020-06-11 NOTE — Discharge Instructions (Signed)
Covid test pending, monitor my chart for results Amoxicillin every 8 hours/3 times daily for the next week Tessalon/benzonatate every 8 hours for cough Flonase nasal spray 1 to 2 spray in each nostril daily to help with sinus pressure and nasal congestion May use Mucinex DM over-the-counter for further cough/congestion relief Rest and fluids Follow-up if not improving or worsening

## 2020-06-11 NOTE — ED Provider Notes (Signed)
[facility]    CSN: [id number] Arrival date & time: 06/11/20  2111      History   Chief Complaint Chief Complaint  Patient presents with  . Sore Throat    Since Sunday  . Cough    Since Sunday  . Headache    Since Sunday    HPI Abigail [name] is a 74 y.o. female hypertension, hyperlipidemia, CAD, presenting today for evaluation of URI symptoms.  Patient reports over the past 5 days she has had cough congestion sore throat and fatigue.  She denies any known fevers or close sick contacts.  She reports that she has felt swollen lymph nodes in her neck.  Feels similar every year and her PCP typically prescribes her amoxicillin.  She reports that she is prone to developing bronchitis/pneumonia and this often helps to prevent this from occurring.  HPI  Past Medical History:  Diagnosis Date  . Bronchitis 09/2016  . Bruises easily   . DDD (degenerative disc disease)   . Degenerative joint disease of cervical spine 09-05-11   Cervival area, now some osteoarthritis-lower back and Rt. shoulder  . Depression   . Diverticulitis 09-05-11   hx. gastritis, diverticulitis x2 -now surgery planned  . DIVERTICULITIS, ACUTE 02/10/2010  . Dyspnea    with exertion  . Essential hypertension 09-05-11   tx. Verapamil  . GROIN PAIN 09/21/2010  . Headache(784.0)    headaches are better  . Hearing loss    right ear  . Heart palpitations   . Hemorrhoids   . History of bronchitis   . History of colonic polyps   . History of pneumonia   . History of tension headache   . Hyperlipidemia   . Hypertension   . Hypothyroidism 09-05-11   Supplement used  . Late effect of adverse effect of drug, medicinal or biological substance   . NECK PAIN, ACUTE 09/21/2010  . Neuromuscular disorder (HCC)    carpal tunnel in both hands  . Nocturia   . Osteoarthritis 112-17-12   spine and rt. hip, rt. shoulder  . Patent foramen ovale    Small - unable to be closed -   . Pneumonia   . PONV  (postoperative nausea and vomiting)   . Sleep apnea 09-05-11   no cpap ever, had surgery to remove cartilage, no problems now  . SORE THROAT 04/30/2009  . Stroke (HCC) 09-05-11   2006/2009-(loss of memory, balance issues remains occ.)  . TIA 09/28/2007, 10/2014  . Unstable angina (HCC)    a. 05/2010 Cath: nl cors, EF 55%;  b. 05/2015 Lexiscan MV: small, severe, fixed apical defect and a small, severe, reversible inf lateral defect w/ apical thinning and mild ischemia, EF 54%.  Marland Kitchen URI 09/02/2008  . Vertigo     Patient Active Problem List   Diagnosis Date Noted  . Right leg DVT (HCC) 09/20/2018  . Primary localized osteoarthritis of right hip 08/21/2018  . Primary osteoarthritis of right hip 08/21/2018  . PFO (patent foramen ovale)   . Acute CVA (cerebrovascular accident) (HCC) 02/23/2017  . Right facial numbness 02/23/2017  . Chronic back pain 02/23/2017  . History of partial thyroidectomy 02/23/2017  . Spondylolisthesis of lumbar region 05/03/2016  . Dizziness and giddiness 06/04/2015  . Right-sided lacunar infarction (HCC) 06/04/2015  . Lacunar ataxic hemiparesis 06/03/2015  . Unstable angina (HCC)   . Chest pain 05/07/2015  . Tick bite of right forearm 09/04/2014  . Ventral incisional hernia 12/13/2013  . Benign  microscopic hematuria 03/05/2012  . Diverticulitis of sigmoid colon 05/04/2011  . PRECORDIAL PAIN 06/08/2010  . PALPITATIONS 05/27/2010  . CERVICAL LYMPHADENOPATHY, RIGHT 05/15/2008  . Headache 02/28/2008  . TENSION HEADACHE 02/12/2008  . Sebaceous cyst 12/11/2007  . PROBLEMS WITH SWALLOWING AND MASTICATION 11/20/2007  . COLONIC POLYPS, HX OF 07/23/2007  . Hypothyroidism 03/06/2007  . Hyperlipidemia 03/06/2007  . Essential hypertension 03/06/2007  . History of cardiovascular disorder 03/06/2007    Past Surgical History:  Procedure Laterality Date  . ABDOMINAL HYSTERECTOMY    . BACK SURGERY    . CARDIAC CATHETERIZATION N/A 06/01/2015   Procedure: Left Heart Cath  and Coronary Angiography;  Surgeon: Kathleene Hazelhristopher D McAlhany, MD;  Location: [facility] INVASIVE CV LAB;  Service: Cardiovascular;  Laterality: N/A;  . COLON RESECTION  09/07/2011   Procedure: LAPAROSCOPIC SIGMOID COLON RESECTION;  Surgeon: Mariella SaaBenjamin T Hoxworth, MD;  Location: [facility] ORS;  Service: General;  Laterality: N/A;  with proctoscopy  . COLONOSCOPY  08/06/2015   per Dr. Kinnie ScalesMedoff, adenomatous polyps, repeat in 5 yrs   . CYST EXCISION N/A 11/29/2016   Procedure: EXCISION UMBILICAL CYST;  Surgeon: Glenna FellowsBenjamin Hoxworth, MD;  Location: [facility];  Service: General;  Laterality: N/A;  . ELBOW SURGERY  09-05-11   left elbow -ligament repair  . EYE SURGERY     cataract  . INSERTION OF MESH N/A 12/13/2013   Procedure: INSERTION OF MESH;  Surgeon: Mariella SaaBenjamin T Hoxworth, MD;  Location: [facility];  Service: General;  Laterality: N/A;  . LAPAROSCOPY  09/07/2011   Procedure: LAPAROSCOPY DIAGNOSTIC;  Surgeon: Roselle LocusJames E Tomblin II;  Location: [facility];  Service: Gynecology;  Laterality: N/A;  . MAXIMUM ACCESS (MAS)POSTERIOR LUMBAR INTERBODY FUSION (PLIF) 2 LEVEL N/A 05/03/2016   Procedure: L4-5 L5-S1 Maximum access posterior lumbar interbody fusion;  Surgeon: Maeola HarmanJoseph Stern, MD;  Location: [facility] NEURO ORS;  Service: Neurosurgery;  Laterality: N/A;  L4-5 L5-S1 Maximum access posterior lumbar interbody fusion  . SALPINGOOPHORECTOMY  09/07/2011   Procedure: SALPINGO OOPHERECTOMY;  Surgeon: Roselle LocusJames E Tomblin II;  Location: [facility];  Service: Gynecology;  Laterality: Right;  . THYROIDECTOMY, PARTIAL  09-05-11  . TOTAL HIP ARTHROPLASTY Right 08/21/2018   Procedure: TOTAL HIP ARTHROPLASTY ANTERIOR APPROACH;  Surgeon: Marcene Corningalldorf, Peter, MD;  Location: [facility] OR;  Service: Orthopedics;  Laterality: Right;  . TUBAL LIGATION  1983  . UMBILICAL HERNIA REPAIR  yrs ago  . VENTRAL HERNIA REPAIR  06/20/2012   Procedure: LAPAROSCOPIC VENTRAL HERNIA;  Surgeon: Mariella SaaBenjamin T Hoxworth, MD;  Location: [facility];  Service: General;  Laterality: N/A;  Laparoscopic Repair of  Ventral Hernia with mesh  . VENTRAL HERNIA REPAIR N/A 12/13/2013   Procedure: LAPAROSCOPIC REPAIR RECURRENT VENTRAL INCISIONAL  HERNIA;  Surgeon: Mariella SaaBenjamin T Hoxworth, MD;  Location: [facility];  Service: General;  Laterality: N/A;    OB History   No obstetric history on file.      Home Medications    Prior to Admission medications   Medication Sig Start Date End Date Taking? Authorizing Provider  amLODipine (NORVASC) 2.5 MG tablet TAKE 1 TABLET BY MOUTH EVERY DAY 04/02/20  Yes Cleaver, Thomasene RippleJesse M, NP  atorvastatin (LIPITOR) 40 MG tablet TAKE 1 TABLET BY MOUTH EVERY DAY 01/10/20  Yes Nelwyn SalisburyFry, Stephen A, MD  butalbital-acetaminophen-caffeine (FIORICET) 50-325-40 MG tablet TAKE 1 TABLET BY MOUTH EVERY 6 HOURS AS NEEDED FOR HEADACHE 01/02/20  Yes Nelwyn SalisburyFry, Stephen A, MD  clopidogrel (PLAVIX) 75 MG tablet TAKE 1 TABLET BY MOUTH EVERY DAY 02/24/20  Yes Nelwyn SalisburyFry, Stephen A, MD  levothyroxine (SYNTHROID) 125 MCG tablet TAKE 1 TABLET (125 MCG TOTAL) BY MOUTH DAILY BEFORE BREAKFAST. 04/06/20  Yes Nelwyn Salisbury, MD  losartan (COZAAR) 50 MG tablet TAKE 1 TABLET BY MOUTH EVERY DAY 07/15/19  Yes Nelwyn Salisbury, MD  metoprolol succinate (TOPROL-XL) 25 MG 24 hr tablet TAKE 1 TABLET BY MOUTH EVERY DAY 06/03/20  Yes Lewayne Bunting, MD  tetrahydrozoline 0.05 % ophthalmic solution Place 1 drop into both eyes 4 (four) times daily as needed (for dry/irritated eyes.).   Yes [provider]  traMADol (ULTRAM) 50 MG tablet TAKE 1 TO 2 TABLETS BY MOUTH EVERY 4 TO 6 HOURS AS NEEDED FOR PAIN 02/05/20  Yes Nelwyn Salisbury, MD  amoxicillin (AMOXIL) 500 MG capsule Take 1 capsule (500 mg total) by mouth 3 (three) times daily for 7 days. 06/11/20 06/18/20  Ichiro Chesnut C, PA-C  benzonatate (TESSALON) 200 MG capsule Take 1 capsule (200 mg total) by mouth 3 (three) times daily as needed for up to 7 days for cough. 06/11/20 06/18/20  Lakya Schrupp C, PA-C  fluticasone (FLONASE) 50 MCG/ACT nasal spray Place 1-2 sprays into both nostrils daily.  06/11/20   Vraj Denardo C, PA-C  potassium chloride (KLOR-CON) 10 MEQ tablet TAKE 1 TABLET BY MOUTH EVERY DAY 05/26/20 06/11/20  Nelwyn Salisbury, MD  pregabalin (LYRICA) 75 MG capsule Take 1 capsule (75 mg total) by mouth at bedtime. 04/01/20 06/11/20  Nelwyn Salisbury, MD    Family History Family History  Problem Relation Age of Onset  . Arrhythmia Mother   . Hypertension Mother   . Stroke Mother   . Stroke Maternal Grandmother   . Diabetes Paternal Grandmother   . Breast cancer Sister        5s  . Breast cancer Sister        34s    Social History Social History   Tobacco Use  . Smoking status: Former Smoker    Packs/day: 0.50    Years: 5.00    Pack years: 2.50    Types: Cigarettes    Quit date: 09/19/1968    Years since quitting: 51.7  . Smokeless tobacco: Never Used  Vaping Use  . Vaping Use: Never used  Substance Use Topics  . Alcohol use: Yes    Alcohol/week: 2.0 standard drinks    Types: 1 Cans of beer, 1 Shots of liquor per week    Comment: rarely  . Drug use: No     Allergies   Dilaudid [hydromorphone hcl], Shellfish allergy, Aggrenox [aspirin-dipyridamole er], Cephalexin, Codeine phosphate, Hydrocodone, Meperidine hcl, and Percocet [oxycodone-acetaminophen]   Review of Systems Review of Systems  Constitutional: Negative for activity change, appetite change, chills, fatigue and fever.  HENT: Positive for congestion, rhinorrhea and sore throat. Negative for ear pain, sinus pressure and trouble swallowing.   Eyes: Negative for discharge and redness.  Respiratory: Positive for cough. Negative for chest tightness and shortness of breath.   Cardiovascular: Negative for chest pain.  Gastrointestinal: Negative for abdominal pain, diarrhea, nausea and vomiting.  Musculoskeletal: Negative for myalgias.  Skin: Negative for rash.  Neurological: Negative for dizziness, light-headedness and headaches.     Physical Exam Triage Vital Signs ED Triage Vitals  Enc Vitals  Group     BP      Pulse      Resp      Temp      Temp src      SpO2      Weight  Height      Head Circumference      Peak Flow      Pain Score      Pain Loc      Pain Edu?      Excl. in GC?    No data found.  Updated Vital Signs BP (!) 154/85 (BP Location: Right Arm)   Pulse 65   Temp 98.6 F (37 C) (Oral)   Resp 20   LMP  (LMP Unknown)   SpO2 96%   Visual Acuity Right Eye Distance:   Left Eye Distance:   Bilateral Distance:    Right Eye Near:   Left Eye Near:    Bilateral Near:     Physical Exam Vitals and nursing note reviewed.  Constitutional:      Appearance: She is well-developed.     Comments: No acute distress  HENT:     Head: Normocephalic and atraumatic.     Ears:     Comments: Bilateral ears without tenderness to palpation of external auricle, tragus and mastoid, EAC's without erythema or swelling, TM's with good bony landmarks and cone of light. Non erythematous.     Nose: Nose normal.     Mouth/Throat:     Comments: Oral mucosa pink and moist, no tonsillar enlargement or exudate. Posterior pharynx patent and nonerythematous, no uvula deviation or swelling. Normal phonation. Eyes:     Conjunctiva/sclera: Conjunctivae normal.  Cardiovascular:     Rate and Rhythm: Normal rate.  Pulmonary:     Effort: Pulmonary effort is normal. No respiratory distress.     Comments: Breathing comfortably at rest, CTABL, no wheezing, rales or other adventitious sounds auscultated  Frequent dry cough with associated bronchospasm Abdominal:     General: There is no distension.  Musculoskeletal:        General: Normal range of motion.     Cervical back: Neck supple.  Skin:    General: Skin is warm and dry.  Neurological:     Mental Status: She is alert and oriented to person, place, and time.      UC Treatments / Results  Labs (all labs ordered are listed, but only abnormal results are displayed) Labs Reviewed  NOVEL CORONAVIRUS, NAA     EKG   Radiology No results found.  Procedures Procedures (including critical care time)  Medications Ordered in UC Medications - No data to display  Initial Impression / Assessment and Plan / UC Course  I have reviewed the triage vital signs and the nursing notes.  Pertinent labs & imaging results that were available during my care of the patient were reviewed by me and considered in my medical decision making (see chart for details).     Covid PCR pending, suspect likely viral etiology and recommending continued symptomatic and supportive care given symptoms x5 days, lungs overall clear.  Given reported history/age did opt to go ahead and provide course of amoxicillin.  Continue to monitor cough breathing and symptoms,Discussed strict return precautions. Patient verbalized understanding and is agreeable with plan.  Final Clinical Impressions(s) / UC Diagnoses   Final diagnoses:  Encounter for screening for COVID-19  Acute upper respiratory infection     Discharge Instructions     Covid test pending, monitor my chart for results Amoxicillin every 8 hours/3 times daily for the next week Tessalon/benzonatate every 8 hours for cough Flonase nasal spray 1 to 2 spray in each nostril daily to help with sinus pressure and nasal congestion May use Mucinex  DM over-the-counter for further cough/congestion relief Rest and fluids Follow-up if not improving or worsening    ED Prescriptions    Medication Sig Dispense Auth. Provider   amoxicillin (AMOXIL) 500 MG capsule Take 1 capsule (500 mg total) by mouth 3 (three) times daily for 7 days. 21 capsule Ellenor Wisniewski C, PA-C   benzonatate (TESSALON) 200 MG capsule Take 1 capsule (200 mg total) by mouth 3 (three) times daily as needed for up to 7 days for cough. 28 capsule Kaileena Obi C, PA-C   fluticasone (FLONASE) 50 MCG/ACT nasal spray Place 1-2 sprays into both nostrils daily. 16 g Thao Vanover, [place] C, PA-C     PDMP not  reviewed this encounter.   Lew Dawes, [place] 06/11/20 [phone]

## 2020-06-13 LAB — SARS-COV-2, NAA 2 DAY TAT

## 2020-06-13 LAB — NOVEL CORONAVIRUS, NAA: SARS-CoV-2, NAA: NOT DETECTED

## 2020-06-22 DIAGNOSIS — M5416 Radiculopathy, lumbar region: Secondary | ICD-10-CM | POA: Diagnosis not present

## 2020-06-22 DIAGNOSIS — M5441 Lumbago with sciatica, right side: Secondary | ICD-10-CM | POA: Diagnosis not present

## 2020-06-22 DIAGNOSIS — M4120 Other idiopathic scoliosis, site unspecified: Secondary | ICD-10-CM | POA: Diagnosis not present

## 2020-06-22 DIAGNOSIS — G8929 Other chronic pain: Secondary | ICD-10-CM | POA: Diagnosis not present

## 2020-07-04 ENCOUNTER — Other Ambulatory Visit: Payer: Self-pay | Admitting: Family Medicine

## 2020-07-05 ENCOUNTER — Other Ambulatory Visit: Payer: Self-pay | Admitting: Family Medicine

## 2020-07-15 ENCOUNTER — Other Ambulatory Visit: Payer: Self-pay

## 2020-07-15 ENCOUNTER — Ambulatory Visit: Payer: Medicare PPO | Admitting: Family Medicine

## 2020-07-15 ENCOUNTER — Encounter: Payer: Self-pay | Admitting: Family Medicine

## 2020-07-15 VITALS — BP 160/90 | HR 60 | Temp 98.5°F | Ht 65.0 in | Wt 198.8 lb

## 2020-07-15 DIAGNOSIS — R1013 Epigastric pain: Secondary | ICD-10-CM | POA: Diagnosis not present

## 2020-07-15 NOTE — Progress Notes (Signed)
   Subjective:    Patient ID: Abigail [name], female    DOB: July 27, 1946, 74 y.o.   MRN: [id number]  HPI Here for an episode of upper abdominal pain that she has never had before. This started suddenly 2 evenings ago shortly after she had eaten a meal of barbequed ribs. She had a sharp severe pain in the epigastrium that lasted 12 hours before it went away. This was constant, it do not come and go. She was also nauseated with the pain and she vomited once. As soon as she vomited, the pain went away. She felt chilled at the time but did not have a fever. No change in bowel habits or urinations. No cough or SOB.    Review of Systems  Constitutional: Negative.   Respiratory: Negative.   Cardiovascular: Negative.   Gastrointestinal: Positive for abdominal pain, nausea and vomiting. Negative for abdominal distention, anal bleeding, blood in stool, constipation and diarrhea.  Genitourinary: Negative.        Objective:   Physical Exam Constitutional:      Appearance: She is well-developed. She is not ill-appearing.  Cardiovascular:     Rate and Rhythm: Normal rate and regular rhythm.     Pulses: Normal pulses.     Heart sounds: Normal heart sounds.  Pulmonary:     Effort: Pulmonary effort is normal.     Breath sounds: Normal breath sounds.  Abdominal:     General: Abdomen is flat. Bowel sounds are normal. There is no distension.     Palpations: Abdomen is soft. There is no mass.     Tenderness: There is no abdominal tenderness. There is no right CVA tenderness, left CVA tenderness, guarding or rebound.     Hernia: No hernia is present.  Neurological:     Mental Status: She is alert.           Assessment & Plan:  Epigastric pain, possibly due to gall bladder diease. Set up an abdominal US soon. She will avoid fatty foods for now.  Gershon Crane, MD

## 2020-07-29 ENCOUNTER — Ambulatory Visit
Admission: RE | Admit: 2020-07-29 | Discharge: 2020-07-29 | Disposition: A | Discharge status: Home / Self Care | Payer: Medicare PPO | Source: Ambulatory Visit | Attending: Family Medicine | Admitting: Family Medicine

## 2020-07-29 DIAGNOSIS — R112 Nausea with vomiting, unspecified: Secondary | ICD-10-CM | POA: Diagnosis not present

## 2020-07-29 DIAGNOSIS — R1013 Epigastric pain: Secondary | ICD-10-CM

## 2020-08-05 ENCOUNTER — Other Ambulatory Visit: Payer: Self-pay | Admitting: Family Medicine

## 2020-08-05 ENCOUNTER — Encounter: Payer: Self-pay | Admitting: Family Medicine

## 2020-08-05 ENCOUNTER — Telehealth (INDEPENDENT_AMBULATORY_CARE_PROVIDER_SITE_OTHER): Payer: Medicare PPO | Admitting: Family Medicine

## 2020-08-05 VITALS — Ht 65.0 in | Wt 197.0 lb

## 2020-08-05 DIAGNOSIS — J40 Bronchitis, not specified as acute or chronic: Secondary | ICD-10-CM | POA: Diagnosis not present

## 2020-08-05 MED ORDER — AZITHROMYCIN 250 MG PO TABS: ORAL_TABLET | ORAL | 0 refills | Status: DC

## 2020-08-05 NOTE — Progress Notes (Signed)
   Subjective:    Patient ID: Abigail [name], female    DOB: [birth year], 74 y.o.   MRN: [id number]  HPI Virtual Visit via Telephone Note  I connected with the patient on 08/05/20 at 10:15 AM EST by telephone and verified that I am speaking with the correct person using two identifiers.   I discussed the limitations, risks, security and privacy concerns of performing an evaluation and management service by telephone and the availability of in person appointments. I also discussed with the patient that there may be a patient responsible charge related to this service. The patient expressed understanding and agreed to proceed.  Location patient: home Location provider: work or home office Participants present for the call: patient, provider Patient did not have a visit in the prior 7 days to address this/these issue(s).   History of Present Illness: Here for 5 days of stuffy head, PND, hoarseness, and a dry cough. No SOB or chest pain. No fever. No NVD.    Observations/Objective: Patient sounds cheerful and well on the phone. I do not appreciate any SOB. Speech and thought processing are grossly intact. Patient reported vitals:  Assessment and Plan: Bronchitis, drink fluids and take a Zpack.  Gershon Crane, MD    Follow Up Instructions:     [phone] 5-10 [phone] 11-20 [phone] 21-30 I did not refer this patient for an OV in the next 24 hours for this/these issue(s).  I discussed the assessment and treatment plan with the patient. The patient was provided an opportunity to ask questions and all were answered. The patient agreed with the plan and demonstrated an understanding of the instructions.   The patient was advised to call back or seek an in-person evaluation if the symptoms worsen or if the condition fails to improve as anticipated.  I provided 11 minutes of non-face-to-face time during this encounter.   Gershon Crane, MD    Review of Systems     Objective:   Physical  Exam        Assessment & Plan:

## 2020-08-05 NOTE — Telephone Encounter (Signed)
Received refill request for:  Tramadol 50 mg LR 02/05/20 LOV 07/16/20 FOV none scheduled.  Please advise. Thanks. Dm/cma

## 2020-08-10 ENCOUNTER — Telehealth: Payer: Self-pay | Admitting: Cardiology

## 2020-08-10 DIAGNOSIS — I10 Essential (primary) hypertension: Secondary | ICD-10-CM

## 2020-08-10 NOTE — Telephone Encounter (Signed)
Spoke with pt, her bp has been elevated for about 2 months now. She started tracking it after her medical doctor mentioned it to her. She is consistently getting 150/90's. She also reports and increase in palpitations around the same amount of time. There has been no change in diet or medications in the last 2 months. Current medications confirmed. Will forward for dr Jens Som review

## 2020-08-10 NOTE — Telephone Encounter (Signed)
Pt c/o BP issue: STAT if pt c/o blurred vision, one-sided weakness or slurred speech  1. What are your last 5 BP readings?  08/09/20 160's/95 08/08/20 159/91  2. Are you having any other symptoms (ex. Dizziness, headache, blurred vision, passed out)? SOB and heart racing   3. What is your BP issue? Has been occurring for 2 months wants to discuss medications to see if changes need to be made.

## 2020-08-12 MED ORDER — LOSARTAN POTASSIUM 100 MG PO TABS: 100.0000 mg | ORAL_TABLET | Freq: Every day | ORAL | 3 refills | Status: DC

## 2020-08-12 NOTE — Telephone Encounter (Signed)
Change losartan to 100 mg daily and follow BP; bmet one week [name]

## 2020-08-12 NOTE — Telephone Encounter (Signed)
Spoke with pt, Aware of dr crenshaw's recommendations. New script sent to the pharmacy and Lab orders mailed to the pt  

## 2020-08-17 ENCOUNTER — Encounter: Payer: Self-pay | Admitting: Family Medicine

## 2020-08-17 ENCOUNTER — Other Ambulatory Visit: Payer: Self-pay

## 2020-08-17 ENCOUNTER — Ambulatory Visit: Payer: Medicare PPO | Admitting: Family Medicine

## 2020-08-17 VITALS — BP 178/80 | HR 60 | Temp 98.1°F | Resp 18 | Wt 201.2 lb

## 2020-08-17 DIAGNOSIS — S40022A Contusion of left upper arm, initial encounter: Secondary | ICD-10-CM

## 2020-08-17 NOTE — Progress Notes (Signed)
   Subjective:    Patient ID: Abigail [name], female    DOB: Aug 07, 1946, 74 y.o.   MRN: [id number]  HPI Here to check her left upper arm after she fell at home on 08-11-20. She was going down some steps when lost her balance and fell with her left side striking the railing. Since then she has had mild pain and stiffness in the arm, and there is a knot in the center of some bruising that worries her.    Review of Systems  Constitutional: Negative.   Respiratory: Negative.   Cardiovascular: Negative.   Musculoskeletal: Positive for arthralgias.       Objective:   Physical Exam Constitutional:      Appearance: Normal appearance.     Comments: Walks with a cane   Cardiovascular:     Rate and Rhythm: Normal rate and regular rhythm.     Pulses: Normal pulses.     Heart sounds: Normal heart sounds.  Pulmonary:     Effort: Pulmonary effort is normal.     Breath sounds: Normal breath sounds.  Musculoskeletal:     Comments: There is an area of ecchymosis on the left upper arm with a tender firm lump in the middle of it. The left shoulder has full ROM   Neurological:     General: No focal deficit present.     Mental Status: She is alert and oriented to person, place, and time.           Assessment & Plan:  She has a bruise on the left upper arm with a mall hematoma in the middle of it. This should heal nicely over the next few weeks. Recheck prn.  Gershon Crane, MD

## 2020-08-23 ENCOUNTER — Other Ambulatory Visit: Payer: Self-pay | Admitting: Family Medicine

## 2020-08-27 DIAGNOSIS — I10 Essential (primary) hypertension: Secondary | ICD-10-CM | POA: Diagnosis not present

## 2020-08-28 ENCOUNTER — Encounter: Payer: Self-pay | Admitting: *Deleted

## 2020-08-28 LAB — BASIC METABOLIC PANEL
BUN/Creatinine Ratio: 17 (ref 12–28)
BUN: 11 mg/dL (ref 8–27)
CO2: 23 mmol/L (ref 20–29)
Calcium: 9.6 mg/dL (ref 8.7–10.3)
Chloride: 104 mmol/L (ref 96–106)
Creatinine, Ser: 0.66 mg/dL (ref 0.57–1.00)
GFR calc Af Amer: 100 mL/min/{1.73_m2} (ref >59)
GFR calc non Af Amer: 87 mL/min/{1.73_m2} (ref >59)
Glucose: 96 mg/dL (ref 65–99)
Potassium: 4 mmol/L (ref 3.5–5.2)
Sodium: 142 mmol/L (ref 134–144)

## 2020-09-10 ENCOUNTER — Telehealth: Payer: Self-pay | Admitting: Family Medicine

## 2020-09-10 NOTE — Telephone Encounter (Signed)
Pt is calling in stating that she has the same symptoms from before very painful sore throat and would like to see if she can get something called in.

## 2020-09-10 NOTE — Telephone Encounter (Signed)
Please advise 

## 2020-09-10 NOTE — Telephone Encounter (Signed)
Left message for patient to call back and schedule Medicare Annual Wellness Visit (AWV) either virtually or in office.   Last AWV  No information  please schedule at anytime with [facility] Nurse Health Advisor 1 or 2   This should be a 45 minute visit. 

## 2020-09-13 ENCOUNTER — Other Ambulatory Visit: Payer: Self-pay

## 2020-09-13 ENCOUNTER — Emergency Department (HOSPITAL_COMMUNITY)
Admission: EM | Admit: 2020-09-13 | Discharge: 2020-09-13 | Disposition: A | Discharge status: Home / Self Care | Payer: Medicare PPO | Attending: Emergency Medicine | Admitting: Emergency Medicine

## 2020-09-13 ENCOUNTER — Emergency Department (HOSPITAL_COMMUNITY): Payer: Medicare PPO

## 2020-09-13 ENCOUNTER — Encounter (HOSPITAL_COMMUNITY): Payer: Self-pay | Admitting: Emergency Medicine

## 2020-09-13 DIAGNOSIS — E039 Hypothyroidism, unspecified: Secondary | ICD-10-CM | POA: Diagnosis not present

## 2020-09-13 DIAGNOSIS — Z7902 Long term (current) use of antithrombotics/antiplatelets: Secondary | ICD-10-CM | POA: Insufficient documentation

## 2020-09-13 DIAGNOSIS — Z79899 Other long term (current) drug therapy: Secondary | ICD-10-CM | POA: Insufficient documentation

## 2020-09-13 DIAGNOSIS — I10 Essential (primary) hypertension: Secondary | ICD-10-CM | POA: Diagnosis not present

## 2020-09-13 DIAGNOSIS — R0981 Nasal congestion: Secondary | ICD-10-CM | POA: Diagnosis not present

## 2020-09-13 DIAGNOSIS — Z87891 Personal history of nicotine dependence: Secondary | ICD-10-CM | POA: Diagnosis not present

## 2020-09-13 DIAGNOSIS — Z20822 Contact with and (suspected) exposure to covid-19: Secondary | ICD-10-CM | POA: Insufficient documentation

## 2020-09-13 DIAGNOSIS — Z96641 Presence of right artificial hip joint: Secondary | ICD-10-CM | POA: Insufficient documentation

## 2020-09-13 DIAGNOSIS — R0602 Shortness of breath: Secondary | ICD-10-CM | POA: Diagnosis not present

## 2020-09-13 DIAGNOSIS — J209 Acute bronchitis, unspecified: Secondary | ICD-10-CM | POA: Insufficient documentation

## 2020-09-13 DIAGNOSIS — R059 Cough, unspecified: Secondary | ICD-10-CM | POA: Diagnosis not present

## 2020-09-13 LAB — RESP PANEL BY RT-PCR (FLU A&B, COVID) ARPGX2
Influenza A by PCR: NEGATIVE
Influenza B by PCR: NEGATIVE
SARS Coronavirus 2 by RT PCR: NEGATIVE

## 2020-09-13 MED ORDER — ALBUTEROL SULFATE HFA 108 (90 BASE) MCG/ACT IN AERS
4.0000 | INHALATION_SPRAY | Freq: Once | RESPIRATORY_TRACT | Status: AC
  Administered 2020-09-13: 4 via RESPIRATORY_TRACT
  Filled 2020-09-13: qty 6.7

## 2020-09-13 MED ORDER — DEXAMETHASONE 4 MG PO TABS
10.0000 mg | ORAL_TABLET | Freq: Once | ORAL | Status: AC
  Administered 2020-09-13: 10 mg via ORAL
  Filled 2020-09-13: qty 2

## 2020-09-13 MED ORDER — DOXYCYCLINE HYCLATE 100 MG PO CAPS: 100.0000 mg | ORAL_CAPSULE | Freq: Two times a day (BID) | ORAL | 0 refills | Status: AC

## 2020-09-13 NOTE — ED Notes (Signed)
Patient ambulatory to restroom with cane without difficulty.

## 2020-09-13 NOTE — ED Provider Notes (Signed)
[facility]-EMERGENCY DEPT Provider Note   CSN: [id number] Arrival date & time: 09/13/20  1040     History Chief Complaint  Patient presents with  . Cough  . Nasal Congestion    Abigail [name] is a 74 y.o. female.  The history is provided by the patient.  Cough Cough characteristics:  Non-productive Sputum characteristics:  Nondescript Severity:  Mild Onset quality:  Gradual Timing:  Intermittent Progression:  Waxing and waning Chronicity:  New Context: upper respiratory infection   Relieved by:  Nothing Worsened by:  Nothing Associated symptoms: sinus congestion   Associated symptoms: no chest pain, no chills, no diaphoresis, no ear fullness, no ear pain, no eye discharge, no fever, no rhinorrhea, no shortness of breath, no sore throat, no weight loss and no wheezing        Past Medical History:  Diagnosis Date  . Bronchitis 09/2016  . Bruises easily   . DDD (degenerative disc disease)   . Degenerative joint disease of cervical spine 09-05-11   Cervival area, now some osteoarthritis-lower back and Rt. shoulder  . Depression   . Diverticulitis 09-05-11   hx. gastritis, diverticulitis x2 -now surgery planned  . DIVERTICULITIS, ACUTE 02/10/2010  . Dyspnea    with exertion  . Essential hypertension 09-05-11   tx. Verapamil  . GROIN PAIN 09/21/2010  . Headache(784.0)    headaches are better  . Hearing loss    right ear  . Heart palpitations   . Hemorrhoids   . History of bronchitis   . History of colonic polyps   . History of pneumonia   . History of tension headache   . Hyperlipidemia   . Hypertension   . Hypothyroidism 09-05-11   Supplement used  . Late effect of adverse effect of drug, medicinal or biological substance   . NECK PAIN, ACUTE 09/21/2010  . Neuromuscular disorder (HCC)    carpal tunnel in both hands  . Nocturia   . Osteoarthritis 112-17-12   spine and rt. hip, rt. shoulder  . Patent foramen ovale    Small - unable to be  closed -   . Pneumonia   . PONV (postoperative nausea and vomiting)   . Sleep apnea 09-05-11   no cpap ever, had surgery to remove cartilage, no problems now  . SORE THROAT 04/30/2009  . Stroke (HCC) 09-05-11   2006/2009-(loss of memory, balance issues remains occ.)  . TIA 09/28/2007, 10/2014  . Unstable angina (HCC)    a. 05/2010 Cath: nl cors, EF 55%;  b. 05/2015 Lexiscan MV: small, severe, fixed apical defect and a small, severe, reversible inf lateral defect w/ apical thinning and mild ischemia, EF 54%.  Marland Kitchen. URI 09/02/2008  . Vertigo     Patient Active Problem List   Diagnosis Date Noted  . Right leg DVT (HCC) 09/20/2018  . Primary localized osteoarthritis of right hip 08/21/2018  . Primary osteoarthritis of right hip 08/21/2018  . PFO (patent foramen ovale)   . Acute CVA (cerebrovascular accident) (HCC) 02/23/2017  . Right facial numbness 02/23/2017  . Chronic back pain 02/23/2017  . History of partial thyroidectomy 02/23/2017  . Spondylolisthesis of lumbar region 05/03/2016  . Dizziness and giddiness 06/04/2015  . Right-sided lacunar infarction (HCC) 06/04/2015  . Lacunar ataxic hemiparesis 06/03/2015  . Unstable angina (HCC)   . Chest pain 05/07/2015  . Tick bite of right forearm 09/04/2014  . Ventral incisional hernia 12/13/2013  . Benign microscopic hematuria 03/05/2012  . Diverticulitis of  sigmoid colon 05/04/2011  . PRECORDIAL PAIN 06/08/2010  . PALPITATIONS 05/27/2010  . CERVICAL LYMPHADENOPATHY, RIGHT 05/15/2008  . Headache 02/28/2008  . TENSION HEADACHE 02/12/2008  . Sebaceous cyst 12/11/2007  . PROBLEMS WITH SWALLOWING AND MASTICATION 11/20/2007  . COLONIC POLYPS, HX OF 07/23/2007  . Hypothyroidism 03/06/2007  . Hyperlipidemia 03/06/2007  . Essential hypertension 03/06/2007  . History of cardiovascular disorder 03/06/2007    Past Surgical History:  Procedure Laterality Date  . ABDOMINAL HYSTERECTOMY    . BACK SURGERY    . CARDIAC CATHETERIZATION N/A  06/01/2015   Procedure: Left Heart Cath and Coronary Angiography;  Surgeon: Kathleene Hazel, MD;  Location: [facility] INVASIVE CV LAB;  Service: Cardiovascular;  Laterality: N/A;  . COLON RESECTION  09/07/2011   Procedure: LAPAROSCOPIC SIGMOID COLON RESECTION;  Surgeon: Mariella Saa, MD;  Location: [facility] ORS;  Service: General;  Laterality: N/A;  with proctoscopy  . COLONOSCOPY  08/06/2015   per Dr. Kinnie Scales, adenomatous polyps, repeat in 5 yrs   . CYST EXCISION N/A 11/29/2016   Procedure: EXCISION UMBILICAL CYST;  Surgeon: Glenna Fellows, MD;  Location: [facility] ORS;  Service: General;  Laterality: N/A;  . ELBOW SURGERY  09-05-11   left elbow -ligament repair  . EYE SURGERY     cataract  . INSERTION OF MESH N/A 12/13/2013   Procedure: INSERTION OF MESH;  Surgeon: Mariella Saa, MD;  Location: [facility] ORS;  Service: General;  Laterality: N/A;  . LAPAROSCOPY  09/07/2011   Procedure: LAPAROSCOPY DIAGNOSTIC;  Surgeon: Roselle Locus II;  Location: [facility] ORS;  Service: Gynecology;  Laterality: N/A;  . MAXIMUM ACCESS (MAS)POSTERIOR LUMBAR INTERBODY FUSION (PLIF) 2 LEVEL N/A 05/03/2016   Procedure: L4-5 L5-S1 Maximum access posterior lumbar interbody fusion;  Surgeon: Maeola Harman, MD;  Location: [facility] NEURO ORS;  Service: Neurosurgery;  Laterality: N/A;  L4-5 L5-S1 Maximum access posterior lumbar interbody fusion  . SALPINGOOPHORECTOMY  09/07/2011   Procedure: SALPINGO OOPHERECTOMY;  Surgeon: Roselle Locus II;  Location: [facility] ORS;  Service: Gynecology;  Laterality: Right;  . THYROIDECTOMY, PARTIAL  09-05-11  . TOTAL HIP ARTHROPLASTY Right 08/21/2018   Procedure: TOTAL HIP ARTHROPLASTY ANTERIOR APPROACH;  Surgeon: Marcene Corning, MD;  Location: [facility] OR;  Service: Orthopedics;  Laterality: Right;  . TUBAL LIGATION  1983  . UMBILICAL HERNIA REPAIR  yrs ago  . VENTRAL HERNIA REPAIR  06/20/2012   Procedure: LAPAROSCOPIC VENTRAL HERNIA;  Surgeon: Mariella Saa, MD;  Location: [facility] ORS;  Service: General;   Laterality: N/A;  Laparoscopic Repair of Ventral Hernia with mesh  . VENTRAL HERNIA REPAIR N/A 12/13/2013   Procedure: LAPAROSCOPIC REPAIR RECURRENT VENTRAL INCISIONAL  HERNIA;  Surgeon: Mariella Saa, MD;  Location: [facility] ORS;  Service: General;  Laterality: N/A;     OB History   No obstetric history on file.     Family History  Problem Relation Age of Onset  . Arrhythmia Mother   . Hypertension Mother   . Stroke Mother   . Stroke Maternal Grandmother   . Diabetes Paternal Grandmother   . Breast cancer Sister        47s  . Breast cancer Sister        53s    Social History   Tobacco Use  . Smoking status: Former Smoker    Packs/day: 0.50    Years: 5.00    Pack years: 2.50    Types: Cigarettes    Quit date: 09/19/1968    Years since quitting: 52.0  .  Smokeless tobacco: Never Used  Vaping Use  . Vaping Use: Never used  Substance Use Topics  . Alcohol use: Yes    Alcohol/week: 2.0 standard drinks    Types: 1 Cans of beer, 1 Shots of liquor per week    Comment: rarely  . Drug use: No    Home Medications Prior to Admission medications   Medication Sig Start Date End Date Taking? Authorizing Provider  amLODipine (NORVASC) 2.5 MG tablet TAKE 1 TABLET BY MOUTH EVERY DAY 04/02/20   Ronney Asters, NP  atorvastatin (LIPITOR) 40 MG tablet TAKE 1 TABLET BY MOUTH EVERY DAY 01/10/20   Nelwyn Salisbury, MD  azithromycin (ZITHROMAX Z-PAK) 250 MG tablet As directed 08/05/20   Nelwyn Salisbury, MD  butalbital-acetaminophen-caffeine (FIORICET) [phone] MG tablet TAKE 1 TABLET BY MOUTH EVERY 6 HOURS AS NEEDED FOR HEADACHE 07/07/20   Nelwyn Salisbury, MD  clopidogrel (PLAVIX) 75 MG tablet TAKE 1 TABLET BY MOUTH EVERY DAY 02/24/20   Nelwyn Salisbury, MD  doxycycline (VIBRAMYCIN) 100 MG capsule Take 1 capsule (100 mg total) by mouth 2 (two) times daily for 7 days. 09/13/20 09/20/20  Random Dobrowski, DO  fluticasone (FLONASE) 50 MCG/ACT nasal spray Place 1-2 sprays into both nostrils daily. 06/11/20    Wieters, Hallie C, PA-C  levothyroxine (SYNTHROID) 125 MCG tablet TAKE 1 TABLET (125 MCG TOTAL) BY MOUTH DAILY BEFORE BREAKFAST. 08/24/20   Nelwyn Salisbury, MD  losartan (COZAAR) 100 MG tablet Take 1 tablet (100 mg total) by mouth daily. 08/12/20   Lewayne Bunting, MD  metoprolol succinate (TOPROL-XL) 25 MG 24 hr tablet TAKE 1 TABLET BY MOUTH EVERY DAY 06/03/20   Lewayne Bunting, MD  tetrahydrozoline 0.05 % ophthalmic solution Place 1 drop into both eyes 4 (four) times daily as needed (for dry/irritated eyes.).    [provider]  traMADol (ULTRAM) 50 MG tablet TAKE 1 TO 2 TABLETS BY MOUTH EVERY 4 TO 6 HOURS AS NEEDED FOR PAIN 08/06/20   Nelwyn Salisbury, MD  potassium chloride (KLOR-CON) 10 MEQ tablet TAKE 1 TABLET BY MOUTH EVERY DAY 05/26/20 06/11/20  Nelwyn Salisbury, MD  pregabalin (LYRICA) 75 MG capsule Take 1 capsule (75 mg total) by mouth at bedtime. 04/01/20 06/11/20  Nelwyn Salisbury, MD    Allergies    Dilaudid [hydromorphone hcl], Shellfish allergy, Aggrenox [aspirin-dipyridamole er], Cephalexin, Codeine phosphate, Hydrocodone, Meperidine hcl, and Percocet [oxycodone-acetaminophen]  Review of Systems   Review of Systems  Constitutional: Negative for chills, diaphoresis, fever and weight loss.  HENT: Positive for congestion. Negative for ear pain, rhinorrhea and sore throat.   Eyes: Negative for pain, discharge and visual disturbance.  Respiratory: Positive for cough. Negative for shortness of breath and wheezing.   Cardiovascular: Negative for chest pain and palpitations.  Gastrointestinal: Negative for abdominal pain and vomiting.  All other systems reviewed and are negative.   Physical Exam Updated Vital Signs  ED Triage Vitals [09/13/20 1109]  Enc Vitals Group     BP (!) 154/92     Pulse Rate 65     Resp 16     Temp 98.4 F (36.9 C)     Temp Source Oral     SpO2 98 %     Weight      Height      Head Circumference      Peak Flow      Pain Score      Pain Loc  Pain Edu?      Excl. in GC?     Physical Exam Vitals and nursing note reviewed.  Constitutional:      General: She is not in acute distress.    Appearance: She is well-developed and well-nourished. She is not ill-appearing.  HENT:     Head: Normocephalic and atraumatic.     Nose: Nose normal.     Mouth/Throat:     Mouth: Mucous membranes are moist.  Eyes:     Extraocular Movements: Extraocular movements intact.     Conjunctiva/sclera: Conjunctivae normal.     Pupils: Pupils are equal, round, and reactive to light.  Cardiovascular:     Rate and Rhythm: Normal rate and regular rhythm.     Pulses: Normal pulses.     Heart sounds: Normal heart sounds. No murmur heard.   Pulmonary:     Effort: Pulmonary effort is normal. No respiratory distress.     Comments: Coarse breath sounds throughout  Musculoskeletal:        General: No edema.     Cervical back: Normal range of motion and neck supple.  Neurological:     Mental Status: She is alert.  Psychiatric:        Mood and Affect: Mood and affect normal.     ED Results / Procedures / Treatments   Labs (all labs ordered are listed, but only abnormal results are displayed) Labs Reviewed  RESP PANEL BY RT-PCR (FLU A&B, COVID) [id number]    EKG None  Radiology DG Chest Portable 1 View  Result Date: 09/13/2020 CLINICAL DATA:  Cough. Congestion. Shortness of breath for 1 week. Hypertension. EXAM: PORTABLE CHEST 1 VIEW COMPARISON:  09/14/2018 FINDINGS: Midline trachea. Normal heart size for level of inspiration. No right and no definite left pleural effusion. No pneumothorax. Clear right lung. Breast tissue projects over the left lung base, mildly limiting evaluation. IMPRESSION: No acute cardiopulmonary disease. Mildly limited evaluation of the left lung base, as detailed above. Electronically Signed   By: Jeronimo Greaves M.D.   On: 09/13/2020 12:24    Procedures Procedures (including critical care time)  Medications Ordered in  ED Medications  albuterol (VENTOLIN HFA) 108 (90 Base) MCG/ACT inhaler 4 puff (4 puffs Inhalation Given 09/13/20 1233)  dexamethasone (DECADRON) tablet 10 mg (10 mg Oral Given 09/13/20 1339)    ED Course  I have reviewed the triage vital signs and the nursing notes.  Pertinent labs & imaging results that were available during my care of the patient were reviewed by me and considered in my medical decision making (see chart for details).    MDM Rules/Calculators/A&P                          Abigail [name] is a 74 year old female with history of high cholesterol, stroke who presents to the ED with cough, URI symptoms.  Normal vitals.  No fever.  Symptoms for the last several days.  Granddaughter with similar symptoms.  Covid test and influenza test negative.  Chest x-ray overall clear.  Does have some scattered wheezing/coarse breath sounds on exam.  Suspect bronchitis.  Felt better with albuterol use which she has used in the past for similar type colds.  Does not think she has any underlying asthma or COPD.  Overall appears comfortable.  Was able to ambulate without any issues.  Was given a dose of Decadron as well recommend continued use of albuterol.  Will prescribe doxycycline in  case early pneumonia process.  Understands return precautions and recommend close follow-up with primary care doctor.  This chart was dictated using voice recognition software.  Despite best efforts to proofread,  errors can occur which can change the documentation meaning.    Final Clinical Impression(s) / ED Diagnoses Final diagnoses:  Acute bronchitis, unspecified organism    Rx / DC Orders ED Discharge Orders         Ordered    doxycycline (VIBRAMYCIN) 100 MG capsule  2 times daily        09/13/20 1414           Cleon Signorelli, DO 09/13/20 1420

## 2020-09-13 NOTE — ED Triage Notes (Signed)
Patient c/o cough, congestion, and SOB x1 week after keeping granddaughter with similar symptoms. Denies N/V/D.

## 2020-09-13 NOTE — Discharge Instructions (Addendum)
Recommend using the albuterol inhaler with 2 puffs every 4 hours, can use additional puffs as needed as well

## 2020-09-14 ENCOUNTER — Telehealth: Payer: Self-pay | Admitting: Family Medicine

## 2020-09-14 NOTE — Telephone Encounter (Signed)
Patient is requesting a call back regarding previous message left, please advise. CB is [phone]

## 2020-09-14 NOTE — Telephone Encounter (Signed)
She was seen in the ER yesterday

## 2020-09-14 NOTE — Telephone Encounter (Signed)
Patient states she was put on doxycycline for her bronchitis this weekend at the emergency room.  She states it is upsetting her stomach and she would like Dr. Clent Ridges to call something else in for her.   Patient is requesting a call back.

## 2020-09-15 ENCOUNTER — Other Ambulatory Visit: Payer: Self-pay | Admitting: *Deleted

## 2020-09-15 ENCOUNTER — Telehealth: Payer: Self-pay

## 2020-09-15 MED ORDER — AZITHROMYCIN 250 MG PO TABS: ORAL_TABLET | ORAL | 0 refills | Status: DC

## 2020-09-15 NOTE — Telephone Encounter (Signed)
Notified pt instructions by Dr. Clent Ridges and sent Rx Z-pack to [facility]

## 2020-09-15 NOTE — Telephone Encounter (Signed)
Stop the Doxycycline and call in a Zpack instead

## 2020-09-15 NOTE — Telephone Encounter (Signed)
Pt called into office requesting to speak with me. Called pt left a message for patient to call back.

## 2020-09-16 IMAGING — CR DG CHEST 2V
2 series · 2 of 2 positions shown · non-contrast
Comparison: 02/24/2017

CLINICAL DATA: Preoperative evaluation for RIGHT hip replacement,
history hypertension, former smoker

EXAM:
CHEST - 2 VIEW

[w chest pa]
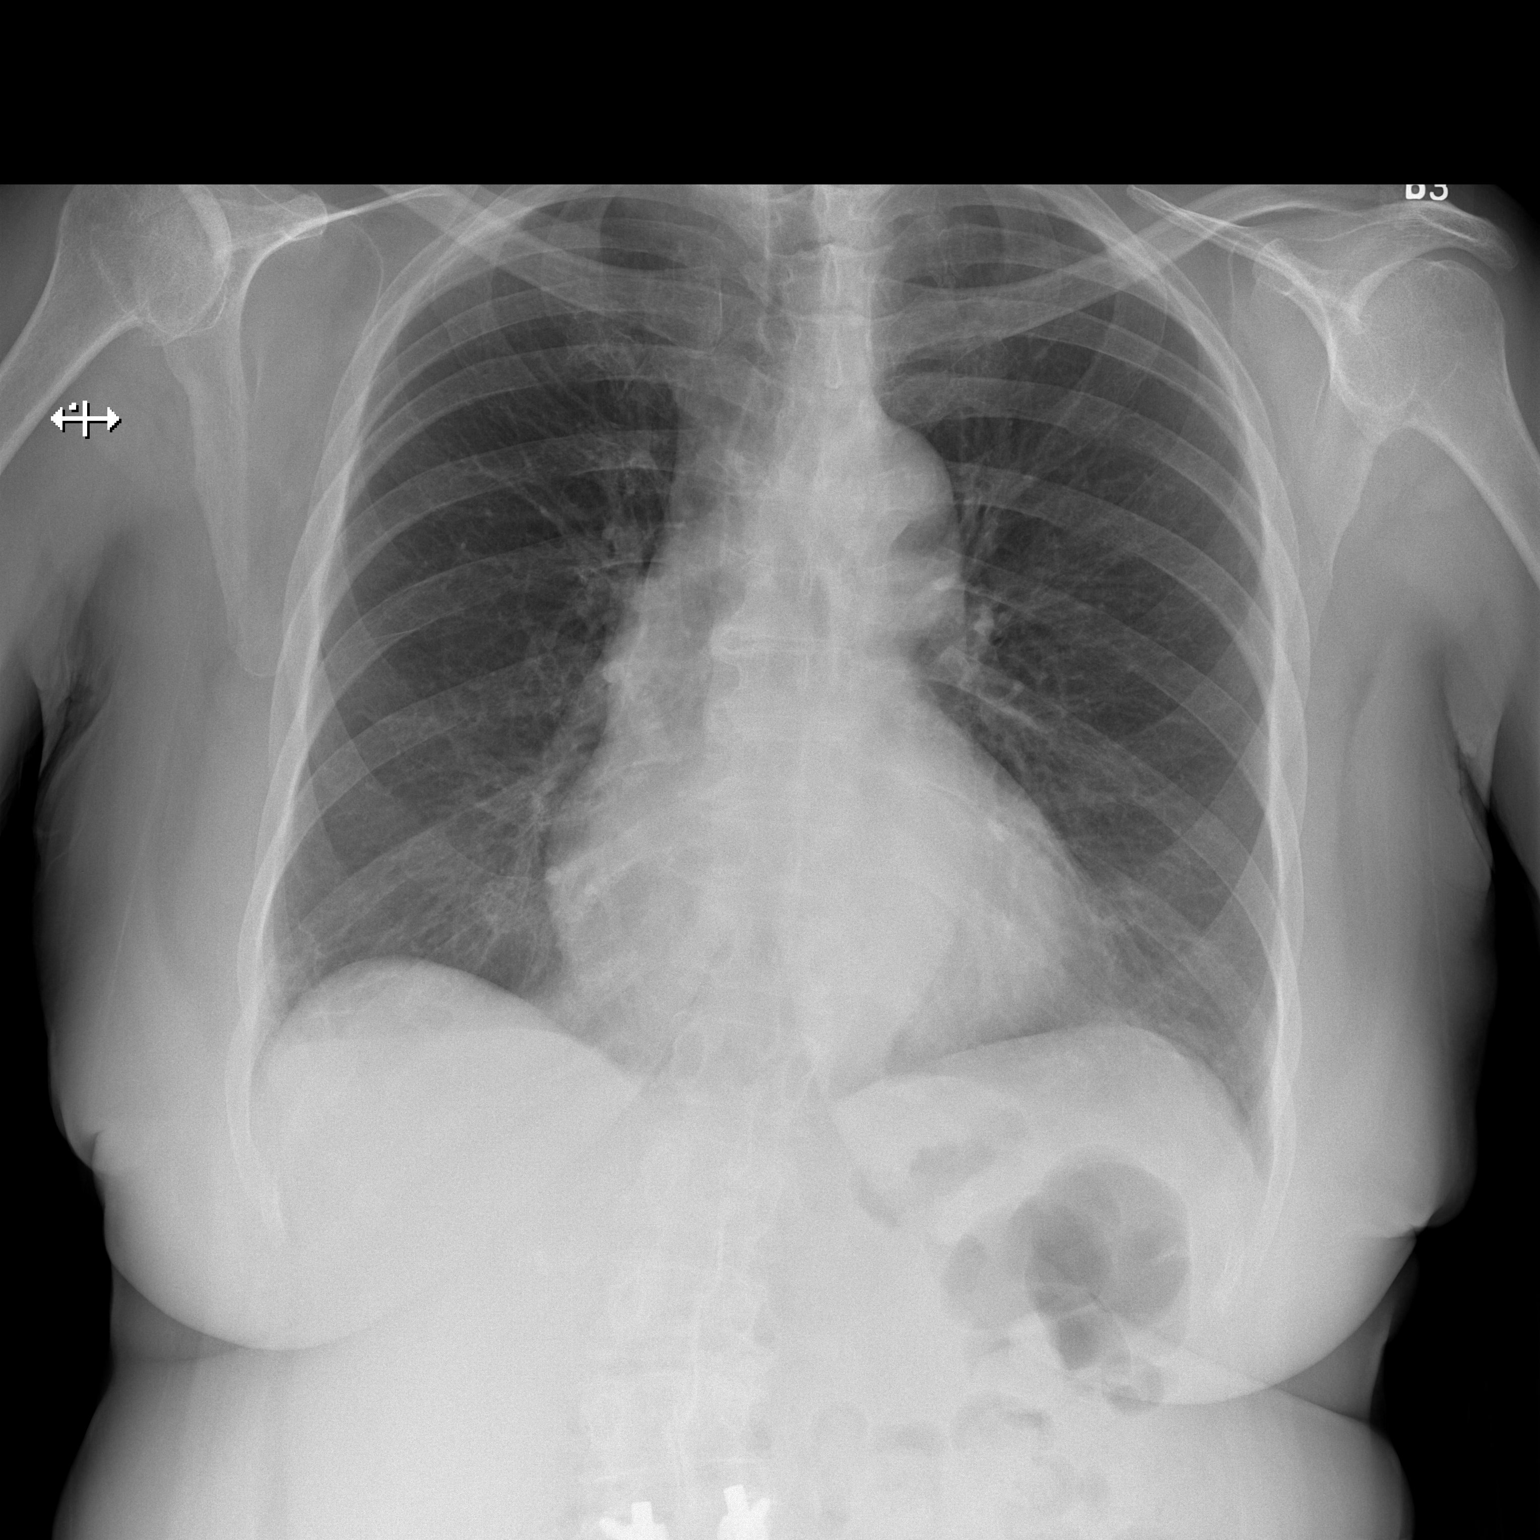

[w chest lat]
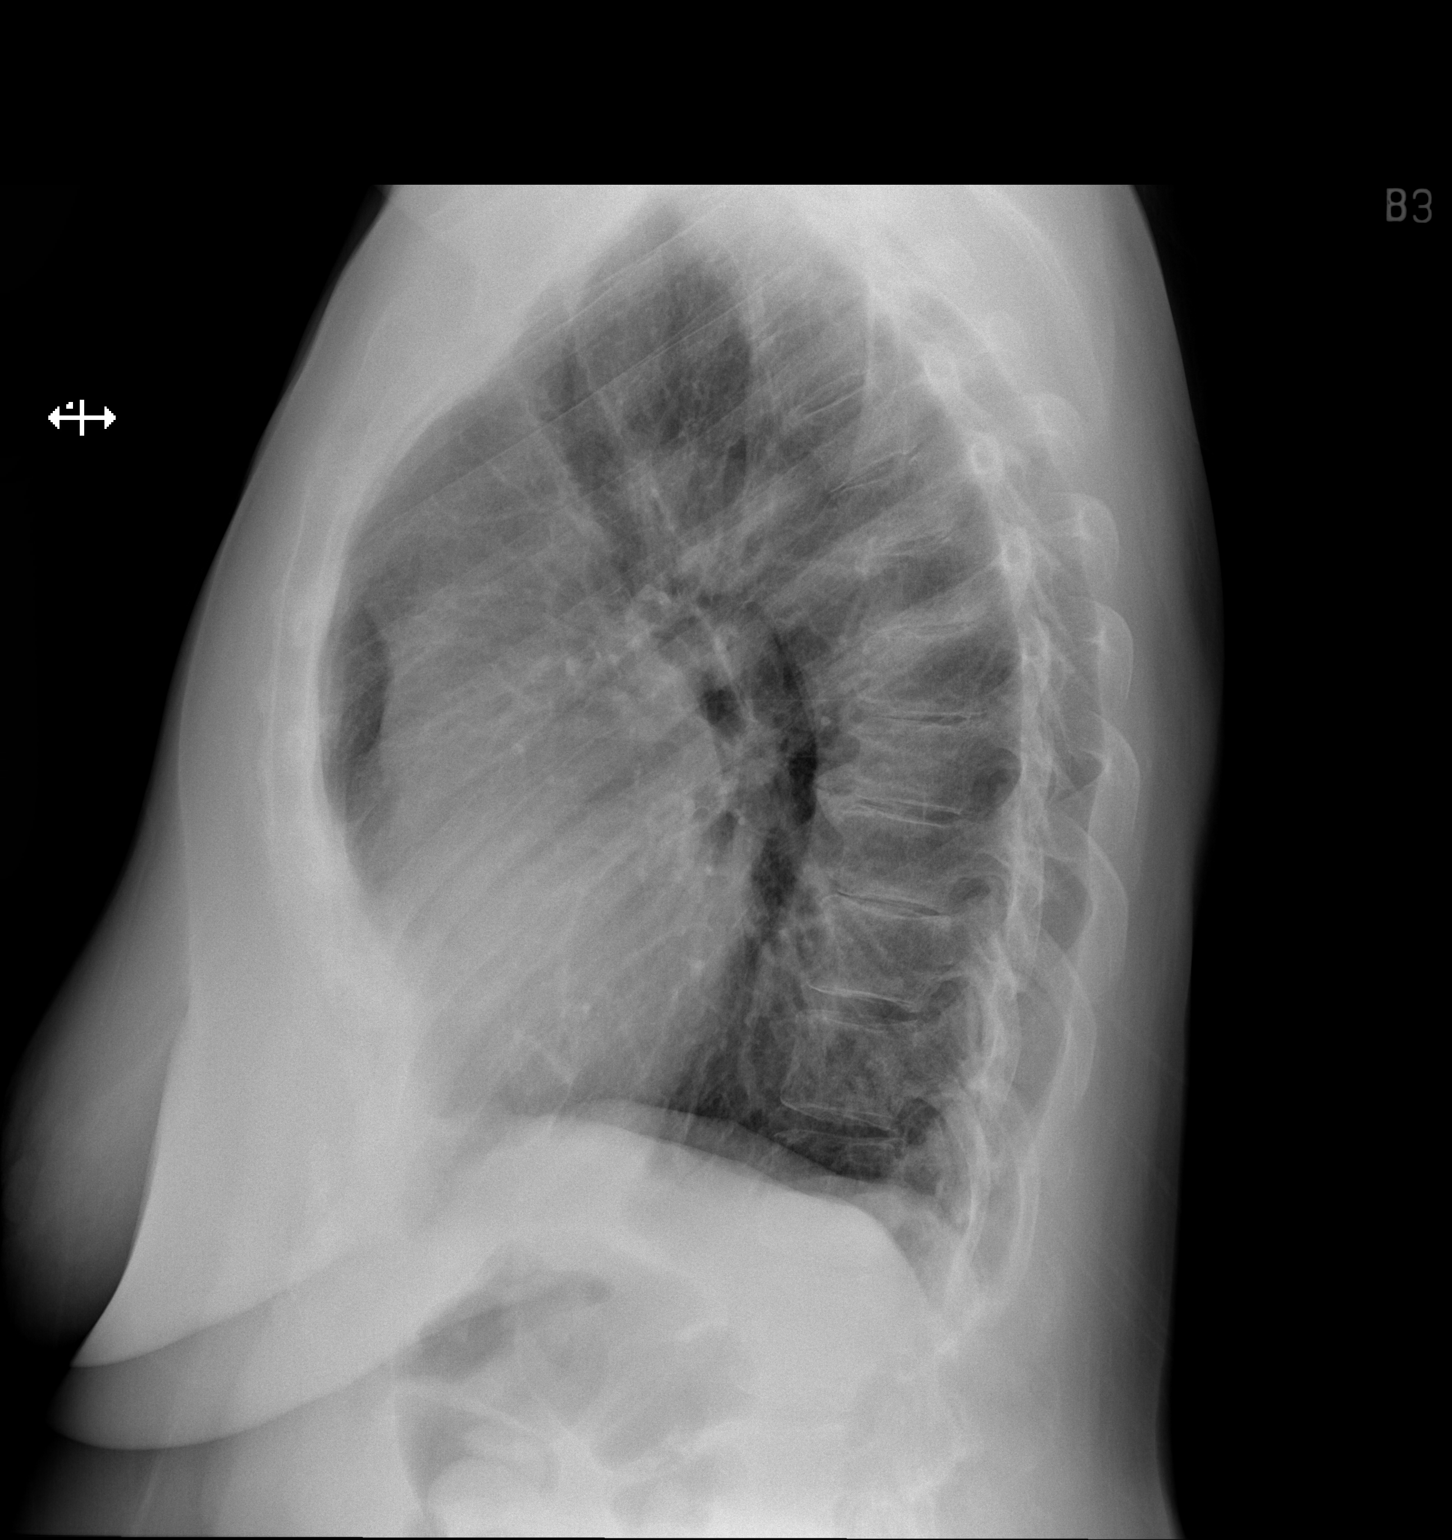

[2 of 2 positions shown; findings below may reference images not displayed]

FINDINGS: Upper normal heart size.

Mediastinal contours and pulmonary vascularity normal.

Lungs clear.

No pulmonary infiltrate, pleural effusion or pneumothorax.

Scattered endplate spur formation thoracic spine.
IMPRESSION: No acute abnormalities.

## 2020-09-28 ENCOUNTER — Encounter: Payer: Self-pay | Admitting: Family Medicine

## 2020-09-28 ENCOUNTER — Telehealth (INDEPENDENT_AMBULATORY_CARE_PROVIDER_SITE_OTHER): Payer: Medicare PPO | Admitting: Family Medicine

## 2020-09-28 DIAGNOSIS — J40 Bronchitis, not specified as acute or chronic: Secondary | ICD-10-CM

## 2020-09-28 MED ORDER — BENZONATATE 200 MG PO CAPS: 200.0000 mg | ORAL_CAPSULE | Freq: Four times a day (QID) | ORAL | 0 refills | Status: DC | PRN

## 2020-09-28 MED ORDER — LEVOFLOXACIN 500 MG PO TABS: 500.0000 mg | ORAL_TABLET | Freq: Every day | ORAL | 0 refills | Status: AC

## 2020-09-28 MED ORDER — METHYLPREDNISOLONE 4 MG PO TBPK: ORAL_TABLET | ORAL | 0 refills | Status: DC

## 2020-09-28 NOTE — Progress Notes (Signed)
   Subjective:    Patient ID: Abigail [name], female    DOB: 1945-12-09, 75 y.o.   MRN: [id number]  HPI Here for continued chest congestion and coughing. No SOB or chest pain or fever. She was seen in the ER on 09-13-20 and she had a clear CXR. She was given some steroids and was prescribed Doxycycline. However she never started the Doxycycline because it gives her cramps and diarrhea. Since then she has continued to have the non-productive cough. She tested negative for Covid and flu in the ER.  Virtual Visit via Telephone Note  I connected with the patient on 09/28/20 at  4:00 PM EST by telephone and verified that I am speaking with the correct person using two identifiers.   I discussed the limitations, risks, security and privacy concerns of performing an evaluation and management service by telephone and the availability of in person appointments. I also discussed with the patient that there may be a patient responsible charge related to this service. The patient expressed understanding and agreed to proceed.  Location patient: home Location provider: work or home office Participants present for the call: patient, provider Patient did not have a visit in the prior 7 days to address this/these issue(s).   History of Present Illness:    Observations/Objective: Patient sounds cheerful and well on the phone. I do not appreciate any SOB. Speech and thought processing are grossly intact. Patient reported vitals:  Assessment and Plan: Bronchitis. We will treat her with Levaquin for 10 days. Add Benzonatate for the cough as well as a Medrol dose pack.  Gershon Crane, MD   Follow Up Instructions:     [phone] 5-10 [phone] 11-20 [phone] 21-30 I did not refer this patient for an OV in the next 24 hours for this/these issue(s).  I discussed the assessment and treatment plan with the patient. The patient was provided an opportunity to ask questions and all were answered. The patient agreed with  the plan and demonstrated an understanding of the instructions.   The patient was advised to call back or seek an in-person evaluation if the symptoms worsen or if the condition fails to improve as anticipated.  I provided 16 minutes of non-face-to-face time during this encounter.   Gershon Crane, MD    Review of Systems     Objective:   Physical Exam        Assessment & Plan:

## 2020-10-20 ENCOUNTER — Telehealth: Payer: Self-pay | Admitting: Neurology

## 2020-10-20 NOTE — Telephone Encounter (Signed)
Called patient back, LVM as there were no answer.  Informed patient that her surgeon will send [place] a surgical clearance form in which he will review her chart and determine how long she will need to hold any medication.  Left office number to call back for any questions.

## 2020-10-20 NOTE — Telephone Encounter (Signed)
Pt. is asking how many days should she stop taking plavix before colonoscopy appt. She states may leave vm. Please advise.

## 2020-10-21 ENCOUNTER — Telehealth: Payer: Self-pay | Admitting: Neurology

## 2020-10-21 NOTE — Telephone Encounter (Signed)
[facility] called, have faxed over clearance form for hold on blood thinner.  Need surgical clearance form as soon as possible, Pt has surgery on 2/8.  Fax no: [phone]

## 2020-10-22 NOTE — Telephone Encounter (Signed)
Abigail [name] from [facility] and clarified surgical clearance form she received.  No further questions.

## 2020-10-29 DIAGNOSIS — Z1211 Encounter for screening for malignant neoplasm of colon: Secondary | ICD-10-CM | POA: Diagnosis not present

## 2020-10-29 DIAGNOSIS — Z980 Intestinal bypass and anastomosis status: Secondary | ICD-10-CM | POA: Diagnosis not present

## 2020-10-29 DIAGNOSIS — D123 Benign neoplasm of transverse colon: Secondary | ICD-10-CM | POA: Diagnosis not present

## 2020-10-29 DIAGNOSIS — K5730 Diverticulosis of large intestine without perforation or abscess without bleeding: Secondary | ICD-10-CM | POA: Diagnosis not present

## 2020-10-29 DIAGNOSIS — K648 Other hemorrhoids: Secondary | ICD-10-CM | POA: Diagnosis not present

## 2020-10-29 DIAGNOSIS — K635 Polyp of colon: Secondary | ICD-10-CM | POA: Diagnosis not present

## 2020-10-29 DIAGNOSIS — D120 Benign neoplasm of cecum: Secondary | ICD-10-CM | POA: Diagnosis not present

## 2020-10-29 DIAGNOSIS — Z86010 Personal history of colonic polyps: Secondary | ICD-10-CM | POA: Diagnosis not present

## 2020-11-02 ENCOUNTER — Encounter: Payer: Self-pay | Admitting: Family Medicine

## 2020-11-02 ENCOUNTER — Other Ambulatory Visit: Payer: Self-pay

## 2020-11-02 ENCOUNTER — Encounter (HOSPITAL_BASED_OUTPATIENT_CLINIC_OR_DEPARTMENT_OTHER): Payer: Self-pay

## 2020-11-02 ENCOUNTER — Telehealth (INDEPENDENT_AMBULATORY_CARE_PROVIDER_SITE_OTHER): Payer: Medicare PPO | Admitting: Family Medicine

## 2020-11-02 ENCOUNTER — Emergency Department (HOSPITAL_BASED_OUTPATIENT_CLINIC_OR_DEPARTMENT_OTHER)
Admission: EM | Admit: 2020-11-02 | Discharge: 2020-11-02 | Disposition: A | Discharge status: Home / Self Care | Payer: Medicare PPO | Attending: Emergency Medicine | Admitting: Emergency Medicine

## 2020-11-02 VITALS — Ht 65.0 in | Wt 195.0 lb

## 2020-11-02 DIAGNOSIS — R112 Nausea with vomiting, unspecified: Secondary | ICD-10-CM | POA: Insufficient documentation

## 2020-11-02 DIAGNOSIS — K5790 Diverticulosis of intestine, part unspecified, without perforation or abscess without bleeding: Secondary | ICD-10-CM | POA: Diagnosis not present

## 2020-11-02 DIAGNOSIS — R1010 Upper abdominal pain, unspecified: Secondary | ICD-10-CM | POA: Diagnosis not present

## 2020-11-02 DIAGNOSIS — Z9889 Other specified postprocedural states: Secondary | ICD-10-CM | POA: Diagnosis not present

## 2020-11-02 DIAGNOSIS — R197 Diarrhea, unspecified: Secondary | ICD-10-CM | POA: Insufficient documentation

## 2020-11-02 DIAGNOSIS — Z5321 Procedure and treatment not carried out due to patient leaving prior to being seen by health care provider: Secondary | ICD-10-CM | POA: Insufficient documentation

## 2020-11-02 DIAGNOSIS — G8929 Other chronic pain: Secondary | ICD-10-CM | POA: Insufficient documentation

## 2020-11-02 DIAGNOSIS — R109 Unspecified abdominal pain: Secondary | ICD-10-CM | POA: Insufficient documentation

## 2020-11-02 LAB — CBC WITH DIFFERENTIAL/PLATELET
Abs Immature Granulocytes: 0.02 10*3/uL (ref 0.00–0.07)
Basophils Absolute: 0 10*3/uL (ref 0.0–0.1)
Basophils Relative: 0 %
Eosinophils Absolute: 0.1 10*3/uL (ref 0.0–0.5)
Eosinophils Relative: 1 %
HCT: 41.9 % (ref 36.0–46.0)
Hemoglobin: 14.2 g/dL (ref 12.0–15.0)
Immature Granulocytes: 0 %
Lymphocytes Relative: 27 %
Lymphs Abs: 2.3 10*3/uL (ref 0.7–4.0)
MCH: 30.6 pg (ref 26.0–34.0)
MCHC: 33.9 g/dL (ref 30.0–36.0)
MCV: 90.3 fL (ref 80.0–100.0)
Monocytes Absolute: 0.7 10*3/uL (ref 0.1–1.0)
Monocytes Relative: 9 %
Neutro Abs: 5.3 10*3/uL (ref 1.7–7.7)
Neutrophils Relative %: 63 %
Platelets: 163 10*3/uL (ref 150–400)
RBC: 4.64 MIL/uL (ref 3.87–5.11)
RDW: 12.9 % (ref 11.5–15.5)
WBC: 8.3 10*3/uL (ref 4.0–10.5)
nRBC: 0 % (ref 0.0–0.2)

## 2020-11-02 LAB — COMPREHENSIVE METABOLIC PANEL
ALT: 12 U/L (ref 0–44)
AST: 13 U/L — ABNORMAL LOW (ref 15–41)
Albumin: 3.9 g/dL (ref 3.5–5.0)
Alkaline Phosphatase: 116 U/L (ref 38–126)
Anion gap: 9 (ref 5–15)
BUN: 9 mg/dL (ref 8–23)
CO2: 24 mmol/L (ref 22–32)
Calcium: 9.6 mg/dL (ref 8.9–10.3)
Chloride: 105 mmol/L (ref 98–111)
Creatinine, Ser: 0.71 mg/dL (ref 0.44–1.00)
GFR, Estimated: >60 mL/min (ref >60)
Glucose, Bld: 105 mg/dL — ABNORMAL HIGH (ref 70–99)
Potassium: 3.5 mmol/L (ref 3.5–5.1)
Sodium: 138 mmol/L (ref 135–145)
Total Bilirubin: 0.4 mg/dL (ref 0.3–1.2)
Total Protein: 7.5 g/dL (ref 6.5–8.1)

## 2020-11-02 LAB — LIPASE, BLOOD: Lipase: 24 U/L (ref 11–51)

## 2020-11-02 NOTE — Progress Notes (Signed)
   Subjective:    Patient ID: Abigail [name], female    DOB: 1945-09-25, 75 y.o.   MRN: [id number]  HPI Virtual Visit via Telephone Note  I connected with the patient on 11/02/20 at  1:45 PM EST by telephone and verified that I am speaking with the correct person using two identifiers.   I discussed the limitations, risks, security and privacy concerns of performing an evaluation and management service by telephone and the availability of in person appointments. I also discussed with the patient that there may be a patient responsible charge related to this service. The patient expressed understanding and agreed to proceed.  Location patient: home Location provider: work or home office Participants present for the call: patient, provider Patient did not have a visit in the prior 7 days to address this/these issue(s).   History of Present Illness: Here with GI problems after a colonoscopy by Dr. Kinnie Scales on 10-29-20. She says the procedure seemed to go well and several polyps were removed. Over the past 2 days she has felt bloated and has had moderate abdominal cramps with watery diarrhea. No fever. No nausea or vomiting. She called the GI office, and an appt was made for her to see Shirleen Schirmer PA today at 4:30 pm. Abigail [name] is worried about going there because she is afraid of having a bowel accident.    Observations/Objective: Patient sounds cheerful and well on the phone. I do not appreciate any SOB. Speech and thought processing are grossly intact. Patient reported vitals:  Assessment and Plan: Abdominal pain and diarrhea after a recent colonoscopy. I urged her to keep this appt because I feel she needs to be examined in person. Her husband wears adult diapers, so I suggested she wear one of these on the way to the GI office. She agreed to go.  Gershon Crane, MD   Follow Up Instructions:     [phone] 5-10 [phone] 11-20 9443 21-30 I did not refer this patient for an OV in the next 24 hours  for this/these issue(s).  I discussed the assessment and treatment plan with the patient. The patient was provided an opportunity to ask questions and all were answered. The patient agreed with the plan and demonstrated an understanding of the instructions.   The patient was advised to call back or seek an in-person evaluation if the symptoms worsen or if the condition fails to improve as anticipated.  I provided 12 minutes of non-face-to-face time during this encounter.   Gershon Crane, MD    Review of Systems     Objective:   Physical Exam        Assessment & Plan:

## 2020-11-02 NOTE — ED Triage Notes (Addendum)
Pt c/o right side abd pain x 3 days also c/o n/v "chronic diarrhea"-pain started the day after colonoscopy-NAD-slow unsteady gait-states she uses cane-did not bring into ED-pt placed in w/c after triage

## 2020-11-04 DIAGNOSIS — R109 Unspecified abdominal pain: Secondary | ICD-10-CM | POA: Diagnosis not present

## 2020-11-04 DIAGNOSIS — Z9889 Other specified postprocedural states: Secondary | ICD-10-CM | POA: Diagnosis not present

## 2020-11-04 DIAGNOSIS — K529 Noninfective gastroenteritis and colitis, unspecified: Secondary | ICD-10-CM | POA: Diagnosis not present

## 2020-11-12 ENCOUNTER — Other Ambulatory Visit: Payer: Self-pay

## 2020-11-13 ENCOUNTER — Ambulatory Visit: Payer: Medicare PPO | Admitting: Family Medicine

## 2020-11-13 ENCOUNTER — Encounter: Payer: Self-pay | Admitting: Family Medicine

## 2020-11-13 ENCOUNTER — Other Ambulatory Visit: Payer: Self-pay

## 2020-11-13 VITALS — BP 152/82 | HR 64 | Temp 98.4°F | Ht 65.0 in | Wt 200.8 lb

## 2020-11-13 DIAGNOSIS — K529 Noninfective gastroenteritis and colitis, unspecified: Secondary | ICD-10-CM | POA: Diagnosis not present

## 2020-11-13 MED ORDER — ONDANSETRON HCL 8 MG PO TABS: 8.0000 mg | ORAL_TABLET | Freq: Four times a day (QID) | ORAL | 0 refills | Status: DC | PRN

## 2020-11-13 NOTE — Progress Notes (Signed)
   Subjective:    Patient ID: Abigail [name], female    DOB: 08-Nov-1945, 75 y.o.   MRN: [id number]  HPI Here for GI issues. We saw her on 11-02-20 for abdominal pain and diarrhea. This was after she had a colonoscopy per Dr. Kinnie Scales on 10-29-20. She then saw someone in Dr. Jennye Boroughs office later that day. She was sent for a CT scan on 11-04-20 which revealed thickened walls in the small bowel indicating an enteritis. She was started on Cipro and Flagyl. She then had one day of bloody diarrhea earlier this week but this has stopped. Today she complains of nausea and abdominal pains. She has not been vomiting. No fever. Drinking fluids. This morning she was able to eat some eggs and grits.    Review of Systems  Constitutional: Negative.   Respiratory: Negative.   Cardiovascular: Negative.   Gastrointestinal: Positive for abdominal pain and nausea. Negative for abdominal distention, anal bleeding, blood in stool, constipation, diarrhea, rectal pain and vomiting.  Genitourinary: Negative.        Objective:   Physical Exam Constitutional:      Appearance: She is well-developed. She is ill-appearing.  Cardiovascular:     Rate and Rhythm: Normal rate and regular rhythm.     Pulses: Normal pulses.     Heart sounds: Normal heart sounds.  Pulmonary:     Effort: Pulmonary effort is normal.     Breath sounds: Normal breath sounds.  Abdominal:     General: Abdomen is flat. Bowel sounds are normal. There is no distension.     Palpations: Abdomen is soft. There is no mass.     Tenderness: There is no guarding or rebound.     Hernia: No hernia is present.     Comments: Mild diffuse tenderness   Neurological:     Mental Status: She is alert.           Assessment & Plan:  Partially treated enteritis. She will finish out the Cipro and Flagyl. She can use Zofran as needed for nausea. Recheck prn.  Gershon Crane, MD

## 2020-11-23 ENCOUNTER — Other Ambulatory Visit: Payer: Self-pay

## 2020-11-24 ENCOUNTER — Encounter: Payer: Self-pay | Admitting: Family Medicine

## 2020-11-24 ENCOUNTER — Ambulatory Visit: Payer: Medicare PPO | Admitting: Family Medicine

## 2020-11-24 VITALS — BP 126/80 | HR 87 | Temp 98.5°F | Ht 65.0 in | Wt 200.0 lb

## 2020-11-24 DIAGNOSIS — R739 Hyperglycemia, unspecified: Secondary | ICD-10-CM

## 2020-11-24 DIAGNOSIS — R350 Frequency of micturition: Secondary | ICD-10-CM

## 2020-11-24 LAB — POC URINALSYSI DIPSTICK (AUTOMATED)
Bilirubin, UA: NEGATIVE
Glucose, UA: NEGATIVE
Ketones, UA: NEGATIVE
Nitrite, UA: NEGATIVE
Protein, UA: NEGATIVE
Spec Grav, UA: 1.015 (ref 1.010–1.025)
Urobilinogen, UA: 0.2 E.U./dL
pH, UA: 6 (ref 5.0–8.0)

## 2020-11-24 LAB — HEMOGLOBIN A1C: Hgb A1c MFr Bld: 6.1 % (ref 4.6–6.5)

## 2020-11-24 MED ORDER — NITROFURANTOIN MONOHYD MACRO 100 MG PO CAPS: 100.0000 mg | ORAL_CAPSULE | Freq: Two times a day (BID) | ORAL | 0 refills | Status: DC

## 2020-11-24 NOTE — Progress Notes (Signed)
   Subjective:    Patient ID: Abigail [name], female    DOB: Mar 27, 1946, 75 y.o.   MRN: [id number]  HPI Here for 2 months of urinary frequency and sometimes urgency. No fever or back pain. No burning. She asks to be checked for diabetes. Her random glucose while at the ER on 11-02-20 was 105. Of note she finished courses of Cipro and Flagyl last week for an enteritis, and this seems to have resolved. The diarrhea and cramps are gone. Her stools are formed and normal again.    Review of Systems  Constitutional: Negative.   Respiratory: Negative.   Cardiovascular: Negative.   Gastrointestinal: Negative.   Genitourinary: Positive for frequency and urgency. Negative for dysuria, flank pain and hematuria.       Objective:   Physical Exam Constitutional:      Appearance: Normal appearance. She is not ill-appearing.  Cardiovascular:     Rate and Rhythm: Normal rate and regular rhythm.     Pulses: Normal pulses.     Heart sounds: Normal heart sounds.  Pulmonary:     Effort: Pulmonary effort is normal.     Breath sounds: Normal breath sounds.  Abdominal:     General: Abdomen is flat. Bowel sounds are normal. There is no distension.     Palpations: Abdomen is soft. There is no mass.     Tenderness: There is no abdominal tenderness. There is no guarding or rebound.     Hernia: No hernia is present.  Neurological:     Mental Status: She is alert.           Assessment & Plan:  She appears to have a UTI, so we will treat this with 7 days of Macrobid. Culture the sample. Drink plenty of water. Check an A1c today to assess her glucose status.  Gershon Crane, MD

## 2020-11-26 LAB — URINE CULTURE
MICRO NUMBER:: 11621131
SPECIMEN QUALITY:: ADEQUATE

## 2020-11-30 ENCOUNTER — Other Ambulatory Visit: Payer: Self-pay

## 2020-11-30 MED ORDER — CIPROFLOXACIN HCL 500 MG PO TABS: ORAL_TABLET | ORAL | 0 refills | Status: DC

## 2020-12-03 ENCOUNTER — Other Ambulatory Visit: Payer: Self-pay | Admitting: Cardiology

## 2020-12-03 ENCOUNTER — Other Ambulatory Visit: Payer: Self-pay | Admitting: Family Medicine

## 2020-12-03 DIAGNOSIS — Z1231 Encounter for screening mammogram for malignant neoplasm of breast: Secondary | ICD-10-CM

## 2020-12-17 DIAGNOSIS — M542 Cervicalgia: Secondary | ICD-10-CM | POA: Diagnosis not present

## 2020-12-17 DIAGNOSIS — R2689 Other abnormalities of gait and mobility: Secondary | ICD-10-CM | POA: Diagnosis not present

## 2020-12-23 ENCOUNTER — Other Ambulatory Visit: Payer: Self-pay | Admitting: General Practice

## 2020-12-23 ENCOUNTER — Other Ambulatory Visit: Payer: Self-pay | Admitting: Family Medicine

## 2020-12-23 ENCOUNTER — Other Ambulatory Visit: Payer: Self-pay | Admitting: Cardiology

## 2020-12-24 ENCOUNTER — Ambulatory Visit: Payer: Medicare PPO | Admitting: Family Medicine

## 2020-12-24 ENCOUNTER — Other Ambulatory Visit: Payer: Self-pay

## 2020-12-24 ENCOUNTER — Encounter: Payer: Self-pay | Admitting: Family Medicine

## 2020-12-24 VITALS — BP 146/94 | HR 55 | Temp 98.4°F | Wt 200.0 lb

## 2020-12-24 DIAGNOSIS — I10 Essential (primary) hypertension: Secondary | ICD-10-CM

## 2020-12-24 DIAGNOSIS — R350 Frequency of micturition: Secondary | ICD-10-CM | POA: Diagnosis not present

## 2020-12-24 DIAGNOSIS — R002 Palpitations: Secondary | ICD-10-CM

## 2020-12-24 LAB — POC URINALSYSI DIPSTICK (AUTOMATED)
Bilirubin, UA: NEGATIVE
Blood, UA: NEGATIVE
Glucose, UA: NEGATIVE
Ketones, UA: NEGATIVE
Leukocytes, UA: NEGATIVE
Nitrite, UA: NEGATIVE
Protein, UA: NEGATIVE
Spec Grav, UA: 1.015 (ref 1.010–1.025)
Urobilinogen, UA: 0.2 E.U./dL
pH, UA: 6 (ref 5.0–8.0)

## 2020-12-24 NOTE — Telephone Encounter (Signed)
Last refill- 07/07/2020-60 tabs with 5 refills Last office visit- 11/24/2020

## 2020-12-24 NOTE — Progress Notes (Signed)
   Subjective:    Patient ID: Abigail [name], female    DOB: 1945-11-21, 75 y.o.   MRN: [id number]  HPI Here to follow up on a recent UTI and with other issues. We saw her on 11-24-20 for urinary frequency and urgency, and her culture grew Strep agactiae. She was treated first with a few days of Macrobid, and then with 7 days of Cipro. Her symptoms resolved and she thinks the infection is gone. She wants to check to be sure. Also she has had some mild ankle swelling for the past month or so. This is not painful. No SOB or chest pain. She does mention some spells of feeling her heart beating rapidly. She has not seen Dr. Jens Som since July of 2020.    Review of Systems  Constitutional: Negative.   Respiratory: Negative.   Cardiovascular: Positive for palpitations and leg swelling. Negative for chest pain.  Gastrointestinal: Negative.   Genitourinary: Negative.        Objective:   Physical Exam Constitutional:      Appearance: Normal appearance.  Cardiovascular:     Rate and Rhythm: Normal rate and regular rhythm.     Pulses: Normal pulses.     Heart sounds: Normal heart sounds.  Pulmonary:     Effort: Pulmonary effort is normal.     Breath sounds: Normal breath sounds.  Musculoskeletal:     Comments: 1+ edema in both ankles   Neurological:     Mental Status: She is alert.           Assessment & Plan:  Her UTI has resolved. Her HTN is marginally controlled and her HR is fine today. We will arrange for her to see Cardiology soon to evaluate the palpitations. Her ankle edema is likely a side effect of the Amlodipine. So far she is tolerating this well so we will change anything.  Gershon Crane, MD

## 2021-01-05 DIAGNOSIS — R519 Headache, unspecified: Secondary | ICD-10-CM | POA: Diagnosis not present

## 2021-01-05 DIAGNOSIS — R2689 Other abnormalities of gait and mobility: Secondary | ICD-10-CM | POA: Diagnosis not present

## 2021-01-05 DIAGNOSIS — M542 Cervicalgia: Secondary | ICD-10-CM | POA: Diagnosis not present

## 2021-01-18 DIAGNOSIS — Z8673 Personal history of transient ischemic attack (TIA), and cerebral infarction without residual deficits: Secondary | ICD-10-CM | POA: Diagnosis not present

## 2021-01-18 DIAGNOSIS — I10 Essential (primary) hypertension: Secondary | ICD-10-CM | POA: Diagnosis not present

## 2021-01-18 DIAGNOSIS — Z6833 Body mass index (BMI) 33.0-33.9, adult: Secondary | ICD-10-CM | POA: Diagnosis not present

## 2021-01-18 DIAGNOSIS — R2689 Other abnormalities of gait and mobility: Secondary | ICD-10-CM | POA: Diagnosis not present

## 2021-01-22 NOTE — Progress Notes (Signed)
Cardiology Clinic Note   Patient Name: Abigail [name] Date of Encounter: 01/25/2021  Primary Care Provider:  Nelwyn SalisburyFry, Stephen A, MD Primary Cardiologist:  Olga MillersBrian Crenshaw, MD  Patient Profile    Abigail [name] 75 year old female presents today for a follow-up evaluation of her palpitations, hypertension, and chest discomfort.  Past Medical History    Past Medical History:  Diagnosis Date  . Bronchitis 09/2016  . Bruises easily   . DDD (degenerative disc disease)   . Degenerative joint disease of cervical spine 09-05-11   Cervival area, now some osteoarthritis-lower back and Rt. shoulder  . Depression   . Diverticulitis 09-05-11   hx. gastritis, diverticulitis x2 -now surgery planned  . DIVERTICULITIS, ACUTE 02/10/2010  . Dyspnea    with exertion  . Essential hypertension 09-05-11   tx. Verapamil  . GROIN PAIN 09/21/2010  . Headache(784.0)    headaches are better  . Hearing loss    right ear  . Heart palpitations   . Hemorrhoids   . History of bronchitis   . History of colonic polyps   . History of pneumonia   . History of tension headache   . Hyperlipidemia   . Hypertension   . Hypothyroidism 09-05-11   Supplement used  . Late effect of adverse effect of drug, medicinal or biological substance   . NECK PAIN, ACUTE 09/21/2010  . Neuromuscular disorder (HCC)    carpal tunnel in both hands  . Nocturia   . Osteoarthritis 112-17-12   spine and rt. hip, rt. shoulder  . Patent foramen ovale    Small - unable to be closed -   . Pneumonia   . PONV (postoperative nausea and vomiting)   . Sleep apnea 09-05-11   no cpap ever, had surgery to remove cartilage, no problems now  . SORE THROAT 04/30/2009  . Stroke (HCC) 09-05-11   2006/2009-(loss of memory, balance issues remains occ.)  . TIA 09/28/2007, 10/2014  . Unstable angina (HCC)    a. 05/2010 Cath: nl cors, EF 55%;  b. 05/2015 Lexiscan MV: small, severe, fixed apical defect and a small, severe, reversible inf lateral defect w/ apical  thinning and mild ischemia, EF 54%.  Marland Kitchen. URI 09/02/2008  . Vertigo    Past Surgical History:  Procedure Laterality Date  . ABDOMINAL HYSTERECTOMY    . BACK SURGERY    . CARDIAC CATHETERIZATION N/A 06/01/2015   Procedure: Left Heart Cath and Coronary Angiography;  Surgeon: Kathleene Hazelhristopher D McAlhany, MD;  [facility];  Service: Cardiovascular;  Laterality: N/A;  . COLON RESECTION  09/07/2011   Procedure: LAPAROSCOPIC SIGMOID COLON RESECTION;  Surgeon: Mariella SaaBenjamin T Hoxworth, MD;  Location: [facility];  Service: General;  Laterality: N/A;  with proctoscopy  . COLONOSCOPY  08/06/2015   per Dr. Kinnie ScalesMedoff, adenomatous polyps, repeat in 5 yrs   . CYST EXCISION N/A 11/29/2016   Procedure: EXCISION UMBILICAL CYST;  Surgeon: Glenna FellowsBenjamin Hoxworth, MD;  Location: [facility];  Service: General;  Laterality: N/A;  . ELBOW SURGERY  09-05-11   left elbow -ligament repair  . EYE SURGERY     cataract  . INSERTION OF MESH N/A 12/13/2013   Procedure: INSERTION OF MESH;  Surgeon: Mariella SaaBenjamin T Hoxworth, MD;  Location: [facility];  Service: General;  Laterality: N/A;  . LAPAROSCOPY  09/07/2011   Procedure: LAPAROSCOPY DIAGNOSTIC;  Surgeon: Roselle LocusJames E Tomblin II;  Location: [facility];  Service: Gynecology;  Laterality: N/A;  . MAXIMUM ACCESS (MAS)POSTERIOR LUMBAR INTERBODY FUSION (PLIF) 2  LEVEL N/A 05/03/2016   Procedure: L4-5 L5-S1 Maximum access posterior lumbar interbody fusion;  Surgeon: Maeola Harman, MD;  Location: [facility];  Service: Neurosurgery;  Laterality: N/A;  L4-5 L5-S1 Maximum access posterior lumbar interbody fusion  . SALPINGOOPHORECTOMY  09/07/2011   Procedure: SALPINGO OOPHERECTOMY;  Surgeon: Roselle Locus II;  Location: [facility];  Service: Gynecology;  Laterality: Right;  . THYROIDECTOMY, PARTIAL  09-05-11  . TOTAL HIP ARTHROPLASTY Right 08/21/2018   Procedure: TOTAL HIP ARTHROPLASTY ANTERIOR APPROACH;  Surgeon: Marcene Corning, MD;  Location: [facility];  Service: Orthopedics;  Laterality: Right;  . TUBAL  LIGATION  1983  . UMBILICAL HERNIA REPAIR  yrs ago  . VENTRAL HERNIA REPAIR  06/20/2012   Procedure: LAPAROSCOPIC VENTRAL HERNIA;  Surgeon: Mariella Saa, MD;  Location: [facility];  Service: General;  Laterality: N/A;  Laparoscopic Repair of Ventral Hernia with mesh  . VENTRAL HERNIA REPAIR N/A 12/13/2013   Procedure: LAPAROSCOPIC REPAIR RECURRENT VENTRAL INCISIONAL  HERNIA;  Surgeon: Mariella Saa, MD;  Location: [facility];  Service: General;  Laterality: N/A;    Allergies  Allergies  Allergen Reactions  . Dilaudid [Hydromorphone Hcl] Shortness Of Breath, Nausea And Vomiting and Other (See Comments)    Able to take morphine without issue  . Shellfish Allergy Shortness Of Breath and Rash    Only shrimp allergy  . Aggrenox [Aspirin-Dipyridamole Er] Nausea And Vomiting and Other (See Comments)    Headache   . Codeine Nausea And Vomiting  . Doxycycline Monohydrate Diarrhea  . Cephalexin Itching and Rash  . Codeine Phosphate Nausea And Vomiting  . Hydrocodone Nausea And Vomiting and Other (See Comments)    Can take morphine without issue  . Meperidine Hcl Nausea And Vomiting  . Percocet [Oxycodone-Acetaminophen] Nausea And Vomiting and Other (See Comments)    Can take morphine without issue    History of Present Illness    Ms. Credeur has a long history of palpitations and presyncope. She was noted to have a PFO on TEE echocardiogram in 2006 following CVA. An event monitor in 2011 for palpitations showed sinus with PACs. And nuclear study 05/2015 showed apical thinning and mild ischemia in the inferior lateral wall. A cardiac catheterization on 05/2015 showed nonobstructive coronary artery disease and normal LV function. Her carotid Dopplers that were done on 6/18 showed 1-39 bilateral stenosis. An echocardiogram 11/2017 showed normal LV function and mild diastolic dysfunction. She had a recent acute DVT following hip surgery 2020.  She was last seen by Dr. Jens Som on 04/16/2019.  During that time she described palpitations associated with dizziness that would last several minutes at a time. She described the episodes as sudden in onset. She did have some dyspnea on exertion but no chest pain at that time.  She contacted the triage nurse on11/13/2020 with vertigo type symptoms. Her blood pressures were high 130s to 150s over 80s with heart rates in the high 50s to low 70s.  She presented to the clinic 09/06/2019 and statedshe had not had any dizziness or noticed any palpitations since she contacted the office 1 month prior. She stated she had been very sedentary with the COVID-19 pandemic. She stated that her diet was poor. She did not eat very much and enjoyed things such as biscuits. She indicated that for 2-1/2 hours that day she had chest discomfort on her left side that radiated down her left arm to her elbow. She stated this was mild in nature and went away with laying  down/rest. She stated that she had this type of chest discomfort every other month. Her chest discomfort wasnonexertional in nature and came on while she was watching television. This chest discomfort was not reproducible in the clinic. It did not appear to be cardiac in nature. I started her on amlodipine 2.5, reevaluated her lipids, and encourage her to eat a low-sodium heart healthy diet.  She presented to the clinic 10/09/2019 and stated she has only had 2 episodes of brief chest discomfort in the past month.  She was pleased about how infrequent her chest discomfort was at the time.  She stated that she did have some increased work of breathing with more strenuous activities.  She did not notice any increased work of breathing with routine activities.  She had been trying to walk for 10 minutes a day however, she was limited due to her back pain.  She stated that she would follow up with her neurosurgeon but did not wish to undergo surgery again.  She also stated that she would like to lose  about 20 to 25 pounds because she felt it would help with her discomfort.  I encouraged her to limit her carbohydrates, and eat a heart healthy diet.  I gave her the salty 6 diet sheet  and planned her follow-up with Dr. Jens Som in 6 months.  She presents the clinic today for follow-up evaluation states she has been struggling with her weight.  She reports that she feels she is not eating enough to gain weight.  Her weight today is 202 pounds.  She reports that she is limited in her physical activity due to orthopedic issues.  She reports occasional episodes of  chest discomfort at rest.  She denies chest discomfort with increased physical activity.  She does note increased shortness of breath with increased physical activity.  I will refer her to the healthy weight and wellness clinic, continue her current medications, have her increase her physical activity as tolerated, and follow-up in 9 months.  We will also give her the [place] support stocking sheet.  Shedenies, increased lower extremity edema, fatigue, palpitations, melena, hematuria, hemoptysis, diaphoresis, weakness, presyncope, syncope, orthopnea, and PND.   Home Medications    Prior to Admission medications   Medication Sig Start Date End Date Taking? Authorizing Provider  amLODipine (NORVASC) 2.5 MG tablet TAKE 1 TABLET BY MOUTH EVERY DAY 12/23/20   Lewayne Bunting, MD  atorvastatin (LIPITOR) 40 MG tablet TAKE 1 TABLET BY MOUTH EVERY DAY 01/10/20   Nelwyn Salisbury, MD  butalbital-acetaminophen-caffeine (FIORICET) [phone] MG tablet TAKE 1 TABLET BY MOUTH EVERY 6 HOURS AS NEEDED FOR HEADACHE 12/24/20   Nelwyn Salisbury, MD  clopidogrel (PLAVIX) 75 MG tablet TAKE 1 TABLET BY MOUTH EVERY DAY 02/24/20   Nelwyn Salisbury, MD  fluticasone (FLONASE) 50 MCG/ACT nasal spray Place 1-2 sprays into both nostrils daily. 06/11/20   Wieters, Hallie C, PA-C  levothyroxine (SYNTHROID) 125 MCG tablet TAKE 1 TABLET (125 MCG TOTAL) BY MOUTH DAILY BEFORE  BREAKFAST. 08/24/20   Nelwyn Salisbury, MD  losartan (COZAAR) 100 MG tablet Take 1 tablet (100 mg total) by mouth daily. 08/12/20   Lewayne Bunting, MD  metoprolol succinate (TOPROL-XL) 25 MG 24 hr tablet TAKE 1 TABLET BY MOUTH EVERY DAY. NEED APPT FOR REFILLS 12/23/20   Lewayne Bunting, MD  tetrahydrozoline 0.05 % ophthalmic solution Place 1 drop into both eyes 4 (four) times daily as needed (for dry/irritated eyes.).    [provider]  traMADol (ULTRAM) 50 MG tablet TAKE 1 TO 2 TABLETS BY MOUTH EVERY 4 TO 6 HOURS AS NEEDED FOR PAIN 08/06/20   Nelwyn Salisbury, MD  potassium chloride (KLOR-CON) 10 MEQ tablet TAKE 1 TABLET BY MOUTH EVERY DAY 05/26/20 06/11/20  Nelwyn Salisbury, MD  pregabalin (LYRICA) 75 MG capsule Take 1 capsule (75 mg total) by mouth at bedtime. 04/01/20 06/11/20  Nelwyn Salisbury, MD    Family History    Family History  Problem Relation Age of Onset  . Arrhythmia Mother   . Hypertension Mother   . Stroke Mother   . Stroke Maternal Grandmother   . Diabetes Paternal Grandmother   . Breast cancer Sister        14s  . Breast cancer Sister        37s   She indicated that her mother is deceased. She indicated that her father is deceased. She indicated that her maternal grandmother is deceased. She indicated that her maternal grandfather is deceased. She indicated that her paternal grandmother is deceased. She indicated that her paternal grandfather is deceased.  Social History    Social History   Socioeconomic History  . Marital status: Married    Spouse name: Not on file  . Number of children: Not on file  . Years of education: Not on file  . Highest education level: Not on file  Occupational History  . Not on file  Tobacco Use  . Smoking status: Former Smoker    Packs/day: 0.50    Years: 5.00    Pack years: 2.50    Types: Cigarettes    Quit date: 09/19/1968    Years since quitting: 52.3  . Smokeless tobacco: Never Used  Vaping Use  . Vaping Use: Never used   Substance and Sexual Activity  . Alcohol use: Yes    Alcohol/week: 2.0 standard drinks    Types: 1 Cans of beer, 1 Shots of liquor per week    Comment: rarely  . Drug use: No  . Sexual activity: Yes  Other Topics Concern  . Not on file  Social History Narrative   Lives in [place] with her husband.  She does not routinely exercise.   Social Determinants of Health   Financial Resource Strain: Not on file  Food Insecurity: Not on file  Transportation Needs: Not on file  Physical Activity: Not on file  Stress: Not on file  Social Connections: Not on file  Intimate Partner Violence: Not on file     Review of Systems    General:  No chills, fever, night sweats or weight changes.  Cardiovascular:  No chest pain, dyspnea on exertion, edema, orthopnea, palpitations, paroxysmal nocturnal dyspnea. Dermatological: No rash, lesions/masses Respiratory: No cough, dyspnea Urologic: No hematuria, dysuria Abdominal:   No nausea, vomiting, diarrhea, bright red blood per rectum, melena, or hematemesis Neurologic:  No visual changes, wkns, changes in mental status. All other systems reviewed and are otherwise negative except as noted above.  Physical Exam    VS:  BP 132/70 (BP Location: Left Arm, Patient Position: Sitting, Cuff Size: Normal)   Pulse (!) 54   Ht 5' 5.5" (1.664 m)   Wt 202 lb (91.6 kg)   LMP  (LMP Unknown)   BMI 33.10 kg/m  , BMI Body mass index is 33.1 kg/m. GEN: Well nourished, well developed, in no acute distress. HEENT: normal. Neck: Supple, no JVD, carotid bruits, or masses. Cardiac: RRR, no murmurs, rubs, or gallops.  No clubbing, cyanosis, edema.  Radials/DP/PT 2+ and equal bilaterally.  Respiratory:  Respirations regular and unlabored, clear to auscultation bilaterally. GI: Soft, nontender, nondistended, BS + x 4. MS: no deformity or atrophy. Skin: warm and dry, no rash. Neuro:  Strength and sensation are intact. Psych: Normal affect.  Accessory Clinical  Findings    Recent Labs: 04/01/2020: TSH 0.98 11/02/2020: ALT 12; BUN 9; Creatinine, Ser 0.71; Hemoglobin 14.2; Platelets 163; Potassium 3.5; Sodium 138   Recent Lipid Panel    Component Value Date/Time   CHOL 167 04/01/2020 1402   CHOL 161 10/02/2019 1246   TRIG 78 04/01/2020 1402   HDL 77 04/01/2020 1402   HDL 74 10/02/2019 1246   CHOLHDL 2.2 04/01/2020 1402   VLDL 17.6 05/15/2018 1528   LDLCALC 74 04/01/2020 1402    ECG personally reviewed by me today-sinus bradycardia ST and T wave abnormality consider inferior ischemia 54 bpm no acute changes.  EKG 09/06/2019 sinus bradycardia 52 bpm- No acute changes  EKG 04/16/2019 Sinus bradycardia LVH 58 bpm  Echocardiogram 12/07/2017 Study Conclusions  - Left ventricle: The cavity size was normal. There was mild focal basal hypertrophy of the septum. Systolic function was vigorous. The estimated ejection fraction was in the range of 65% to 70%. Wall motion was normal; there were no regional wall motion abnormalities. Doppler parameters are consistent with abnormal left ventricular relaxation (grade 1 diastolic dysfunction). - Aortic valve: Trileaflet; mildly thickened, mildly calcified leaflets.  Cardiac event monitor 06/10/2019 Sinus bradycardia, normal sinus rhythm, PVCs, 4 and 5 beats of nonsustained ventricular tachycardia.  Cardiac catheterization 06/01/2015  Prox RCA lesion, 10% stenosed.  Prox Cx lesion, 10% stenosed.  Prox LAD to Dist LAD lesion, 10% stenosed.  The left ventricular systolic function is normal.  1. Mild non-obstructive CAD 2. Normal LV systolic function 3. Non-cardiac chest pain  Recommendations: Medical management of mild CAD.  Assessment & Plan   1.  Essential hypertension-BP [id number].  Better control at home mid 130s-140s systolic and 80s diastolic. Continue losartan, metoprolol, amlodipine  Heart healthy low-sodium diet-salty 6 given Increase physical activity as  tolerated Keep blood pressure log and bring to next appointment Continue weight loss  Hyperlipidemia-LDL 74 04/01/20 Continue continue atorvastatin  Heart healthy high-fiber low-sodium diet Increase physical activity as tolerated  Chest discomfort- no chest discomfort today or recent episodes.  Cardiac catheterization 05/2015 showed mild nonobstructive coronary artery disease, normal LV function and CP was believed to be noncardiac. This chest discomfort appears to be noncardiac as well. EKG shows sinus bradycardia 54 bpm.  She has only had 2 episodes of chest discomfort in the last month with the addition of CCB. Continue amlodipine 2.5 mg daily Increase physical activity-walk 10 minutes 3-4 times per week Heart healthy low-sodium diet-salty 6 given  Palpitations/dizziness-no dizziness or palpitations. Long history of palpitations and presyncope. Cardiac event monitor 05/2019 sinus bradycardia, normal sinus rhythm, PVCs, 4 and 5 beats of nonsustained VT. Patient started onMetoprolol succinate 25 mg daily 06/25/2019. Continue metoprolol succinate 25 mg tablet daily Avoid triggers-caffeine, chocolate, EtOH, etc. Follows with neurology.  History of cerebrovascular accident-2006event Continue clopidogrel 75 mg tablet daily Follows with neurology  Obesity-weight today 202 pounds.  Has struggled with fluctuations in weight.  Reports she has tried to lose weight with dieting. Refer to healthy weight and wellness clinic     Disposition: Follow-up with Dr. Jens Som 9 months.   Thomasene Ripple. Iveliz Garay NP-C    01/25/2021, 3:10 PM [facility] [address] Office [phone] Fax [phone]  Notice: This dictation was prepared with Dragon dictation along with smaller phrase technology. Any transcriptional errors that result from this process are unintentional and may not be corrected upon review.  I spent 15 minutes examining this patient,  reviewing medications, and using patient centered shared decision making involving her cardiac care.  Prior to her visit I spent greater than 20 minutes reviewing her past medical history,  medications, and prior cardiac tests.

## 2021-01-25 ENCOUNTER — Ambulatory Visit (INDEPENDENT_AMBULATORY_CARE_PROVIDER_SITE_OTHER): Payer: Medicare PPO | Admitting: General Practice

## 2021-01-25 ENCOUNTER — Encounter: Payer: Self-pay | Admitting: General Practice

## 2021-01-25 ENCOUNTER — Other Ambulatory Visit: Payer: Self-pay

## 2021-01-25 VITALS — BP 132/70 | HR 54 | Ht 65.5 in | Wt 202.0 lb

## 2021-01-25 DIAGNOSIS — I10 Essential (primary) hypertension: Secondary | ICD-10-CM

## 2021-01-25 DIAGNOSIS — E7800 Pure hypercholesterolemia, unspecified: Secondary | ICD-10-CM | POA: Diagnosis not present

## 2021-01-25 DIAGNOSIS — I639 Cerebral infarction, unspecified: Secondary | ICD-10-CM | POA: Diagnosis not present

## 2021-01-25 DIAGNOSIS — R072 Precordial pain: Secondary | ICD-10-CM | POA: Diagnosis not present

## 2021-01-25 DIAGNOSIS — Z6833 Body mass index (BMI) 33.0-33.9, adult: Secondary | ICD-10-CM

## 2021-01-25 DIAGNOSIS — R002 Palpitations: Secondary | ICD-10-CM

## 2021-01-25 NOTE — Patient Instructions (Signed)
Medication Instructions:  The current medical regimen is effective;  continue present plan and medications as directed. Please refer to the Current Medication list given to you today.  *If you need a refill on your cardiac medications before your next appointment, please call your pharmacy*  Lab Work:   Testing/Procedures:  NONE    NONE  Special Instructions REFERRAL TO NUTRITION  PLEASE READ AND FOLLOW SALTY 6-ATTACHED-1,800mg  daily  Follow-Up: Your next appointment:  9 month(s) In Person with [name], MD OR IF UNAVAILABLE JESSE CLEAVER, FNP-C   Please call our office 2 months in advance to schedule this appointment   At [facility], you and your health needs are our priority.  As part of our continuing mission to provide you with exceptional heart care, we have created designated Provider Care Teams.  These Care Teams include your primary Cardiologist (physician) and Advanced Practice Providers (APPs -  Physician Assistants and Nurse Practitioners) who all work together to provide you with the care you need, when you need it.            6 SALTY THINGS TO AVOID     1,800MG  DAILY

## 2021-01-27 ENCOUNTER — Ambulatory Visit
Admission: RE | Admit: 2021-01-27 | Discharge: 2021-01-27 | Disposition: A | Discharge status: Home / Self Care | Payer: Medicare PPO | Source: Ambulatory Visit | Attending: Family Medicine | Admitting: Family Medicine

## 2021-01-27 ENCOUNTER — Other Ambulatory Visit: Payer: Self-pay

## 2021-01-27 DIAGNOSIS — Z1231 Encounter for screening mammogram for malignant neoplasm of breast: Secondary | ICD-10-CM | POA: Diagnosis not present

## 2021-01-31 ENCOUNTER — Other Ambulatory Visit: Payer: Self-pay | Admitting: Family Medicine

## 2021-02-01 ENCOUNTER — Telehealth: Payer: Self-pay | Admitting: Family Medicine

## 2021-02-01 NOTE — Telephone Encounter (Signed)
Left message for patient to call back and schedule Medicare Annual Wellness Visit (AWV) either virtually or in office.    AWV-I PER [place] 10/21/19  please schedule at anytime with [facility] Nurse Health Advisor 1 or 2   This should be a 45 minute visit.  

## 2021-02-05 ENCOUNTER — Other Ambulatory Visit: Payer: Self-pay

## 2021-02-05 ENCOUNTER — Other Ambulatory Visit: Payer: Self-pay | Admitting: Family Medicine

## 2021-02-05 MED ORDER — TRAMADOL HCL 50 MG PO TABS: ORAL_TABLET | ORAL | 5 refills | Status: DC

## 2021-02-05 MED ORDER — LEVOTHYROXINE SODIUM 125 MCG PO TABS: ORAL_TABLET | ORAL | 1 refills | Status: DC

## 2021-02-05 NOTE — Telephone Encounter (Signed)
Pt is calling in needing a refill on Rx tramadol (ULTRAM)  50 MG due to her being out.  Pharm:  [facility] in [place] [facility] [phone](P).

## 2021-02-05 NOTE — Telephone Encounter (Signed)
Rx for Levothyroxine was sent to pt pharmacy for refill Pt LOV was 12/24/2020  Last refill for Tramadol was done on 08/06/2020 Please advise

## 2021-02-05 NOTE — Telephone Encounter (Signed)
Please advise 

## 2021-02-12 ENCOUNTER — Telehealth: Payer: Self-pay

## 2021-02-12 ENCOUNTER — Encounter: Payer: Self-pay | Admitting: Family Medicine

## 2021-02-12 ENCOUNTER — Other Ambulatory Visit: Payer: Self-pay

## 2021-02-12 ENCOUNTER — Ambulatory Visit: Payer: Medicare PPO | Admitting: Family Medicine

## 2021-02-12 VITALS — BP 136/88 | HR 57 | Temp 99.0°F | Wt 204.0 lb

## 2021-02-12 DIAGNOSIS — G8929 Other chronic pain: Secondary | ICD-10-CM | POA: Diagnosis not present

## 2021-02-12 DIAGNOSIS — M5440 Lumbago with sciatica, unspecified side: Secondary | ICD-10-CM | POA: Diagnosis not present

## 2021-02-12 DIAGNOSIS — E663 Overweight: Secondary | ICD-10-CM | POA: Diagnosis not present

## 2021-02-12 MED ORDER — KETOROLAC TROMETHAMINE 60 MG/2ML IM SOLN
60.0000 mg | Freq: Once | INTRAMUSCULAR | Status: AC
  Administered 2021-02-12: 60 mg via INTRAMUSCULAR

## 2021-02-12 NOTE — Progress Notes (Signed)
   Subjective:    Patient ID: Abigail [name], female    DOB: [birth year], 75 y.o.   MRN: [id number]  HPI Here for low back pain. We sent in refills for Tramadol to her pharmacy on 02-05-21, but she could not get these filled. We learned today that her insurance company is requiring a prior authorization. She asks what we can do to help her today. She also asks for help to lose weight.    Review of Systems  Constitutional: Negative.   Respiratory: Negative.   Cardiovascular: Negative.   Musculoskeletal: Positive for back pain.       Objective:   Physical Exam Cardiovascular:     Rate and Rhythm: Normal rate and regular rhythm.     Pulses: Normal pulses.     Heart sounds: Normal heart sounds.  Pulmonary:     Effort: Pulmonary effort is normal.     Breath sounds: Normal breath sounds.  Neurological:     Mental Status: She is alert.           Assessment & Plan:  Low back pain. We will give her a shot of Toradol today while we work through the PA process. Refer to Nutrition to help her with weight management.  Gershon Crane, MD

## 2021-02-12 NOTE — Addendum Note (Signed)
Addended by: Carola Rhine on: 02/12/2021 11:32 AM   Modules accepted: Orders

## 2021-02-12 NOTE — Telephone Encounter (Signed)
PA for Tramadol was sent to pt plan for approval.Key: [id number]

## 2021-02-17 DIAGNOSIS — Z6833 Body mass index (BMI) 33.0-33.9, adult: Secondary | ICD-10-CM | POA: Diagnosis not present

## 2021-02-17 DIAGNOSIS — Z01419 Encounter for gynecological examination (general) (routine) without abnormal findings: Secondary | ICD-10-CM | POA: Diagnosis not present

## 2021-02-24 DIAGNOSIS — M25521 Pain in right elbow: Secondary | ICD-10-CM | POA: Diagnosis not present

## 2021-02-27 ENCOUNTER — Other Ambulatory Visit: Payer: Self-pay | Admitting: Family Medicine

## 2021-03-10 DIAGNOSIS — L821 Other seborrheic keratosis: Secondary | ICD-10-CM | POA: Diagnosis not present

## 2021-03-10 DIAGNOSIS — Z419 Encounter for procedure for purposes other than remedying health state, unspecified: Secondary | ICD-10-CM | POA: Diagnosis not present

## 2021-03-16 DIAGNOSIS — N760 Acute vaginitis: Secondary | ICD-10-CM | POA: Diagnosis not present

## 2021-03-23 ENCOUNTER — Other Ambulatory Visit: Payer: Self-pay

## 2021-03-24 ENCOUNTER — Ambulatory Visit: Payer: Medicare PPO | Admitting: Family Medicine

## 2021-03-24 ENCOUNTER — Encounter: Payer: Self-pay | Admitting: Family Medicine

## 2021-03-24 VITALS — BP 128/82 | HR 62 | Temp 98.6°F | Wt 200.0 lb

## 2021-03-24 DIAGNOSIS — R600 Localized edema: Secondary | ICD-10-CM | POA: Diagnosis not present

## 2021-03-24 MED ORDER — FUROSEMIDE 20 MG PO TABS
20.0000 mg | ORAL_TABLET | Freq: Every day | ORAL | 5 refills | Status: DC
Start: 2021-03-24 — End: 2022-06-14

## 2021-03-24 NOTE — Progress Notes (Signed)
   Subjective:    Patient ID: Abigail [name], female    DOB: Aug 30, 1946, 75 y.o.   MRN: [id number]  HPI Here for several weeks of mild swelling in both feet and legs. The right is more swollen than the left. No leg pain. No SOB.    Review of Systems  Constitutional: Negative.   Respiratory: Negative.    Cardiovascular:  Positive for leg swelling. Negative for chest pain and palpitations.      Objective:   Physical Exam Constitutional:      Appearance: Normal appearance.  Cardiovascular:     Rate and Rhythm: Normal rate and regular rhythm.     Pulses: Normal pulses.     Heart sounds: Normal heart sounds.  Pulmonary:     Effort: Pulmonary effort is normal.     Breath sounds: Normal breath sounds.  Musculoskeletal:     Comments: 2+ edema in the right lower leg and 1+ edema in left lower leg   Neurological:     Mental Status: She is alert.          Assessment & Plan:  Leg edema, try Lasix 20 mg daily. Recheck as needed.  Gershon Crane, MD

## 2021-03-31 ENCOUNTER — Other Ambulatory Visit: Payer: Self-pay | Admitting: Cardiology

## 2021-04-30 ENCOUNTER — Other Ambulatory Visit: Payer: Self-pay | Admitting: Cardiology

## 2021-05-02 ENCOUNTER — Other Ambulatory Visit: Payer: Self-pay | Admitting: Family Medicine

## 2021-05-08 ENCOUNTER — Other Ambulatory Visit: Payer: Self-pay | Admitting: Family Medicine

## 2021-05-20 ENCOUNTER — Encounter: Payer: Self-pay | Admitting: Family Medicine

## 2021-05-20 ENCOUNTER — Ambulatory Visit: Payer: Medicare PPO | Admitting: Family Medicine

## 2021-05-20 ENCOUNTER — Other Ambulatory Visit: Payer: Self-pay

## 2021-05-20 VITALS — BP 130/82 | HR 58 | Temp 98.8°F | Wt 200.0 lb

## 2021-05-20 DIAGNOSIS — M10072 Idiopathic gout, left ankle and foot: Secondary | ICD-10-CM | POA: Diagnosis not present

## 2021-05-20 MED ORDER — METHYLPREDNISOLONE 4 MG PO TBPK: ORAL_TABLET | ORAL | 0 refills | Status: DC

## 2021-05-20 MED ORDER — METHYLPREDNISOLONE ACETATE 80 MG/ML IJ SUSP
80.0000 mg | Freq: Once | INTRAMUSCULAR | Status: AC
  Administered 2021-05-20: 80 mg via INTRAMUSCULAR

## 2021-05-20 MED ORDER — METHYLPREDNISOLONE ACETATE 40 MG/ML IJ SUSP
40.0000 mg | Freq: Once | INTRAMUSCULAR | Status: AC
  Administered 2021-05-20: 40 mg via INTRAMUSCULAR

## 2021-05-20 NOTE — Progress Notes (Signed)
   Subjective:    Patient ID: Abigail [name], female    DOB: [birth year], 75 y.o.   MRN: [id number]  HPI Here for the sudden onset 2 days of severe pain in the left great toe. This has never happened before. No recent trauma. She has applied Aspercreme and Blue Emu cream with no relief.    Review of Systems  Constitutional: Negative.   Respiratory: Negative.    Cardiovascular: Negative.   Musculoskeletal:  Positive for arthralgias.      Objective:   Physical Exam Constitutional:      Comments: Walks with a cane. In pain   Cardiovascular:     Rate and Rhythm: Normal rate and regular rhythm.     Pulses: Normal pulses.     Heart sounds: Normal heart sounds.  Pulmonary:     Effort: Pulmonary effort is normal.     Breath sounds: Normal breath sounds.  Musculoskeletal:     Comments: The left great MTP joint is warm and very tender. No swelling   Neurological:     Mental Status: She is alert.          Assessment & Plan:  This is a gout episode. I explained the nature of gout to her. We will give her a DepoMedrol shot today and will follow this with a Medrol dose pack. We will check a uric acid level the next time she has blood drawn.  Gershon Crane, MD

## 2021-05-20 NOTE — Addendum Note (Signed)
Addended by: Carola Rhine on: 05/20/2021 11:58 AM   Modules accepted: Orders

## 2021-06-04 ENCOUNTER — Encounter: Payer: Self-pay | Admitting: Family Medicine

## 2021-06-04 ENCOUNTER — Ambulatory Visit (INDEPENDENT_AMBULATORY_CARE_PROVIDER_SITE_OTHER): Payer: Medicare PPO | Admitting: Family Medicine

## 2021-06-04 DIAGNOSIS — J029 Acute pharyngitis, unspecified: Secondary | ICD-10-CM

## 2021-06-04 MED ORDER — LEVOFLOXACIN 500 MG PO TABS: 500.0000 mg | ORAL_TABLET | Freq: Every day | ORAL | 0 refills | Status: AC

## 2021-06-04 NOTE — Progress Notes (Signed)
   Subjective:    Patient ID: Abigail [name], female    DOB: September 27, 1945, 75 y.o.   MRN: [id number]  HPI Virtual Visit via Telephone Note  I connected with the patient on 06/04/21 at  9:45 AM EDT by telephone and verified that I am speaking with the correct person using two identifiers.   I discussed the limitations, risks, security and privacy concerns of performing an evaluation and management service by telephone and the availability of in person appointments. I also discussed with the patient that there may be a patient responsible charge related to this service. The patient expressed understanding and agreed to proceed.  Location patient: home Location provider: work or home office Participants present for the call: patient, provider Patient did not have a visit in the prior 7 days to address this/these issue(s).   History of Present Illness: Here for 5 days of a dry cough and a very sore throat. No fever or SOB. N0 NVD. She tested negative for the Covid-19 virus 2 days ago.    Observations/Objective: Patient sounds cheerful and well on the phone. I do not appreciate any SOB. Speech and thought processing are grossly intact. Patient reported vitals:  Assessment and Plan: Pharyngitis, treatwith Levaquin for 10 days. Use Tylenol prn and drink fluids.  Gershon Crane, MD   Follow Up Instructions:     [phone] 5-10 [phone] 11-20 [id number] 21-30 I did not refer this patient for an OV in the next 24 hours for this/these issue(s).  I discussed the assessment and treatment plan with the patient. The patient was provided an opportunity to ask questions and all were answered. The patient agreed with the plan and demonstrated an understanding of the instructions.   The patient was advised to call back or seek an in-person evaluation if the symptoms worsen or if the condition fails to improve as anticipated.  I provided 13 minutes of non-face-to-face time during this encounter.   Gershon Crane,  MD     Review of Systems     Objective:   Physical Exam        Assessment & Plan:

## 2021-06-14 ENCOUNTER — Other Ambulatory Visit: Payer: Self-pay | Admitting: Family Medicine

## 2021-06-14 NOTE — Telephone Encounter (Signed)
Last O.V- 06/04/21 Last refill- 12/24/20--60 tabs, 5 refills  No future office visit is scheduled.  Can this patient receive a refill?

## 2021-06-23 ENCOUNTER — Other Ambulatory Visit: Payer: Self-pay | Admitting: Cardiology

## 2021-07-05 ENCOUNTER — Ambulatory Visit: Payer: Medicare PPO | Admitting: Family Medicine

## 2021-07-05 ENCOUNTER — Encounter: Payer: Self-pay | Admitting: Family Medicine

## 2021-07-05 ENCOUNTER — Other Ambulatory Visit: Payer: Self-pay

## 2021-07-05 VITALS — BP 130/90 | HR 56 | Temp 98.1°F | Wt 203.0 lb

## 2021-07-05 DIAGNOSIS — K5732 Diverticulitis of large intestine without perforation or abscess without bleeding: Secondary | ICD-10-CM

## 2021-07-05 MED ORDER — CIPROFLOXACIN HCL 500 MG PO TABS: 500.0000 mg | ORAL_TABLET | Freq: Two times a day (BID) | ORAL | 0 refills | Status: AC

## 2021-07-05 MED ORDER — METRONIDAZOLE 500 MG PO TABS: 500.0000 mg | ORAL_TABLET | Freq: Two times a day (BID) | ORAL | 0 refills | Status: DC

## 2021-07-05 NOTE — Progress Notes (Signed)
   Subjective:    Patient ID: Abigail [name], female    DOB: 01-22-1946, 75 y.o.   MRN: [id number]  HPI Here for what she thinks it another bout of diverticulitis. Her last bout of this was in March 2021. About 5 days ago she developed left sided abdominal pain, chills, and watery diarrhea. This started the morning after she ate a lot of pecans. Since then the pain has waxed and waned, as has the diarrhea. No fever. She has been nauseated but has not vomited. No urinary symptoms. She is drinking fluids.    Review of Systems  Constitutional:  Positive for chills. Negative for diaphoresis and fever.  Respiratory: Negative.    Cardiovascular: Negative.   Gastrointestinal:  Positive for abdominal pain and diarrhea. Negative for abdominal distention, anal bleeding, blood in stool, constipation, nausea and vomiting.  Genitourinary: Negative.       Objective:   Physical Exam Constitutional:      Appearance: Normal appearance.  Cardiovascular:     Rate and Rhythm: Normal rate and regular rhythm.     Pulses: Normal pulses.     Heart sounds: Normal heart sounds.  Pulmonary:     Effort: Pulmonary effort is normal.     Breath sounds: Normal breath sounds.  Abdominal:     General: Abdomen is flat. Bowel sounds are normal. There is no distension.     Palpations: Abdomen is soft. There is no mass.     Tenderness: There is no guarding or rebound.     Hernia: No hernia is present.     Comments: Tender in the left flank and LUQ   Neurological:     Mental Status: She is alert.          Assessment & Plan:  Diverticulitis, treat with Flagyl and Cipro. Recheck prn.  Gershon Crane, MD

## 2021-07-29 ENCOUNTER — Ambulatory Visit: Payer: Medicare PPO | Admitting: Family Medicine

## 2021-07-29 ENCOUNTER — Encounter: Payer: Self-pay | Admitting: Family Medicine

## 2021-07-29 VITALS — BP 142/96 | HR 66 | Temp 98.9°F | Wt 203.0 lb

## 2021-07-29 DIAGNOSIS — R072 Precordial pain: Secondary | ICD-10-CM | POA: Diagnosis not present

## 2021-07-29 DIAGNOSIS — I10 Essential (primary) hypertension: Secondary | ICD-10-CM

## 2021-07-29 DIAGNOSIS — R42 Dizziness and giddiness: Secondary | ICD-10-CM | POA: Diagnosis not present

## 2021-07-29 NOTE — Progress Notes (Signed)
   Subjective:    Patient ID: Abigail [name], female    DOB: 04-20-1946, 75 y.o.   MRN: [id number]  HPI Here for several days of BP getting as high as 166/99. During these days she has had spells of chest pressure and SOB, and her vertigo has been acting up. Today she feels much better. Of note she had a cardiac catheterization in 2016 that showed non-obstructive CAD (no lesions greater than 10%). She is taking Amlodipine 2.5 mg daily, Metoprolol XL 25 mg daily and Losartan 100 mg daily.    Review of Systems  Constitutional: Negative.   Respiratory:  Positive for shortness of breath. Negative for cough and wheezing.   Cardiovascular:  Positive for chest pain. Negative for palpitations and leg swelling.  Neurological:  Positive for dizziness.      Objective:   Physical Exam Constitutional:      Appearance: Normal appearance. She is not ill-appearing.  Cardiovascular:     Rate and Rhythm: Normal rate and regular rhythm.     Pulses: Normal pulses.     Heart sounds: Normal heart sounds.  Pulmonary:     Effort: Pulmonary effort is normal.     Breath sounds: Normal breath sounds.  Neurological:     Mental Status: She is alert and oriented to person, place, and time. Mental status is at baseline.          Assessment & Plan:  I think her chest pains and SOB are the result of uncontrolled BP. We will increase the Amlodipine to 10 mg (2 pills) daily. Refer to Cardiology asap. Refer to ENT for the vertigo. Gershon Crane, MD

## 2021-08-16 NOTE — Progress Notes (Signed)
Cardiology Clinic Note   Patient Name: Abigail [name] Date of Encounter: 08/18/2021  Primary Care Provider:  Laurey Morale, MD Primary Cardiologist:  Kirk Ruths, MD  Patient Profile    [name] 75 year old female presents today for a follow-up evaluation of her palpitations, hypertension, and chest discomfort.  Past Medical History    Past Medical History:  Diagnosis Date   Bronchitis 09/2016   Bruises easily    DDD (degenerative disc disease)    Degenerative joint disease of cervical spine 09-05-11   Cervival area, now some osteoarthritis-lower back and Rt. shoulder   Depression    Diverticulitis 09-05-11   hx. gastritis, diverticulitis x2 -now surgery planned   DIVERTICULITIS, ACUTE 02/10/2010   Dyspnea    with exertion   Essential hypertension 09-05-11   tx. Verapamil   GROIN PAIN 09/21/2010   Headache(784.0)    headaches are better   Hearing loss    right ear   Heart palpitations    Hemorrhoids    History of bronchitis    History of colonic polyps    History of pneumonia    History of tension headache    Hyperlipidemia    Hypertension    Hypothyroidism 09-05-11   Supplement used   Late effect of adverse effect of drug, medicinal or biological substance    NECK PAIN, ACUTE 09/21/2010   Neuromuscular disorder ([place])    carpal tunnel in both hands   Nocturia    Osteoarthritis 112-17-12   spine and rt. hip, rt. shoulder   Patent foramen ovale    Small - unable to be closed -    Pneumonia    PONV (postoperative nausea and vomiting)    Sleep apnea 09-05-11   no cpap ever, had surgery to remove cartilage, no problems now   SORE THROAT 04/30/2009   Stroke ([place]) 09-05-11   2006/2009-(loss of memory, balance issues remains occ.)   TIA 09/28/2007, 10/2014   Unstable angina ([place])    a. 05/2010 Cath: nl cors, EF 55%;  b. 05/2015 Lexiscan MV: small, severe, fixed apical defect and a small, severe, reversible inf lateral defect w/ apical thinning and mild ischemia, EF  54%.   URI 09/02/2008   Vertigo    Past Surgical History:  Procedure Laterality Date   ABDOMINAL HYSTERECTOMY     BACK SURGERY     CARDIAC CATHETERIZATION N/A 06/01/2015   Procedure: Left Heart Cath and Coronary Angiography;  Surgeon: Burnell Blanks, MD;  Location: [facility];  Service: Cardiovascular;  Laterality: N/A;   COLON RESECTION  09/07/2011   Procedure: LAPAROSCOPIC SIGMOID COLON RESECTION;  Surgeon: Edward Jolly, MD;  Location: [facility];  Service: General;  Laterality: N/A;  with proctoscopy   COLONOSCOPY  08/06/2015   per Dr. Earlean Shawl, adenomatous polyps, repeat in 5 yrs    CYST EXCISION N/A 11/29/2016   Procedure: EXCISION UMBILICAL CYST;  Surgeon: Excell Seltzer, MD;  Location: [facility];  Service: General;  Laterality: N/A;   ELBOW SURGERY  09-05-11   left elbow -ligament repair   EYE SURGERY     cataract   INSERTION OF MESH N/A 12/13/2013   Procedure: INSERTION OF MESH;  Surgeon: Edward Jolly, MD;  Location: [facility];  Service: General;  Laterality: N/A;   LAPAROSCOPY  09/07/2011   Procedure: LAPAROSCOPY DIAGNOSTIC;  Surgeon: Shon Millet II;  Location: [facility];  Service: Gynecology;  Laterality: N/A;   MAXIMUM ACCESS (MAS)POSTERIOR LUMBAR INTERBODY FUSION (PLIF)  2 LEVEL N/A 05/03/2016   Procedure: L4-5 L5-S1 Maximum access posterior lumbar interbody fusion;  Surgeon: Erline Levine, MD;  Location: [facility];  Service: Neurosurgery;  Laterality: N/A;  L4-5 L5-S1 Maximum access posterior lumbar interbody fusion   SALPINGOOPHORECTOMY  09/07/2011   Procedure: SALPINGO OOPHERECTOMY;  Surgeon: Shon Millet II;  Location: [facility];  Service: Gynecology;  Laterality: Right;   THYROIDECTOMY, PARTIAL  09-05-11   TOTAL HIP ARTHROPLASTY Right 08/21/2018   Procedure: TOTAL HIP ARTHROPLASTY ANTERIOR APPROACH;  Surgeon: Melrose Nakayama, MD;  Location: [facility];  Service: Orthopedics;  Laterality: Right;   TUBAL LIGATION  [id number]   UMBILICAL HERNIA REPAIR  yrs ago    VENTRAL HERNIA REPAIR  06/20/2012   Procedure: LAPAROSCOPIC VENTRAL HERNIA;  Surgeon: Edward Jolly, MD;  Location: [facility];  Service: General;  Laterality: N/A;  Laparoscopic Repair of Ventral Hernia with mesh   VENTRAL HERNIA REPAIR N/A 12/13/2013   Procedure: LAPAROSCOPIC REPAIR RECURRENT VENTRAL INCISIONAL  HERNIA;  Surgeon: Edward Jolly, MD;  Location: [facility];  Service: General;  Laterality: N/A;    Allergies  Allergies  Allergen Reactions   Dilaudid [Hydromorphone Hcl] Shortness Of Breath, Nausea And Vomiting and Other (See Comments)    Able to take morphine without issue   Shellfish Allergy Shortness Of Breath and Rash    Only shrimp allergy   Aggrenox [Aspirin-Dipyridamole Er] Nausea And Vomiting and Other (See Comments)    Headache    Codeine Nausea And Vomiting   Doxycycline Monohydrate Diarrhea   Cephalexin Itching and Rash   Codeine Phosphate Nausea And Vomiting   Hydrocodone Nausea And Vomiting and Other (See Comments)    Can take morphine without issue   Meperidine Hcl Nausea And Vomiting   Percocet [Oxycodone-Acetaminophen] Nausea And Vomiting and Other (See Comments)    Can take morphine without issue    History of Present Illness    [name] has a long history of palpitations and presyncope.  She was noted to have a PFO on TEE echocardiogram in 2006 following CVA.  An event monitor in 2011 for palpitations showed sinus with PACs.  And nuclear study 05/2015 showed apical thinning and mild ischemia in the inferior lateral wall.  A cardiac catheterization on 05/2015 showed nonobstructive coronary artery disease and normal LV function.  Her carotid Dopplers that were done on 6/18 showed 1-39 bilateral stenosis.  An echocardiogram 11/2017 showed normal LV function and mild diastolic dysfunction.  She had a recent acute DVT following hip surgery 2020.   She was last seen by Dr. Stanford Breed on 04/16/2019.  During that time she described palpitations associated with  dizziness that would last several minutes at a time.  She described the episodes as sudden in onset.  She did have some dyspnea on exertion but no chest pain at that time.   She contacted the triage nurse on 08/02/2019 with vertigo type symptoms.  Her blood pressures were high 130s to 150s over 80s with heart rates in the high 50s to low 70s.   She presented to the clinic 09/06/2019 and stated she had not had any dizziness or noticed any palpitations since she contacted the office 1 month prior.  She stated she had been very sedentary with the COVID-19 pandemic.  She stated that her diet was poor.  She did not eat very much and enjoyed things such as biscuits.  She indicated that for 2-1/2 hours that day she had chest discomfort on her left side  that radiated down her left arm to her elbow.  She stated this was mild in nature and went away with laying down/rest.  She stated that she had this type of chest discomfort every other month.  Her chest discomfort was nonexertional in nature and came on while she was watching television.  This chest discomfort was not reproducible in the clinic.  It did not appear to be cardiac in nature.  I started her on amlodipine 2.5, reevaluated her lipids, and encourage her to eat a low-sodium heart healthy diet.   She presented to the clinic 10/09/2019 and stated she has only had 2 episodes of brief chest discomfort in the past month.  She was pleased about how infrequent her chest discomfort was at the time.  She stated that she did have some increased work of breathing with more strenuous activities.  She did not notice any increased work of breathing with routine activities.  She had been trying to walk for 10 minutes a day however, she was limited due to her back pain.  She stated that she would follow up with her neurosurgeon but did not wish to undergo surgery again.  She also stated that she would like to lose about 20 to 25 pounds because she felt it would help with her  discomfort.  I encouraged her to limit her carbohydrates, and eat a heart healthy diet.  I gave her the salty 6 diet sheet  and planned her follow-up with Dr. Stanford Breed in 6 months.  She presented to the clinic 01/25/21 for follow-up evaluation stated she had been struggling with her weight.  She reported that she felt she was not eating enough to gain weight.  Her weight today was 202 pounds.  She reported that she was limited in her physical activity due to orthopedic issues.  She reported occasional episodes of  chest discomfort at rest.  She denied chest discomfort with increased physical activity.  She did note increased shortness of breath with increased physical activity.  I  refered her to the healthy weight and wellness clinic, continued her current medications, asked her increase her physical activity as tolerated, and planned follow-up in 9 months.  She was given the [place] support stocking sheet.  She presents to the clinic today for follow-up evaluation states she has daily if not every other day episodes of chest discomfort.  Her episodes happen both with physical activity and at rest.  They last for around 5 to 10 minutes and dissipated without intervention.  They seem to be coinciding with irregular heartbeats.  We reviewed her previous cardiac catheterization and previous cardiac event monitor.  She reports that she had avoids caffeine medication will have team.  She has had surgery in the past for OSA.  She is limited in her physical activity due to her right hip and back pain.  She does not feel she hydrates well.  We reviewed triggers for palpitations.  We discussed repeating her cardiac event monitor and further evaluation with coronary CTA if her symptoms persist or worsen.  Her blood pressure is also been elevated at home in the 150s over 90s.  I will increase her amlodipine, order a 14-day cardiac event monitor, repeat her fasting lipids and LFTs, give her the salty 6 diet sheet, and  follow-up in 6 to 8 weeks.   She denies, increased lower extremity edema, fatigue, melena, hematuria, hemoptysis, diaphoresis, weakness, presyncope, syncope, orthopnea, and PND.   Home Medications    Prior to  Admission medications   Medication Sig Start Date End Date Taking? Authorizing Provider  amLODipine (NORVASC) 2.5 MG tablet TAKE 1 TABLET BY MOUTH EVERY DAY 12/23/20   Lewayne Bunting, MD  atorvastatin (LIPITOR) 40 MG tablet TAKE 1 TABLET BY MOUTH EVERY DAY 01/10/20   Nelwyn Salisbury, MD  butalbital-acetaminophen-caffeine (FIORICET) [phone] MG tablet TAKE 1 TABLET BY MOUTH EVERY 6 HOURS AS NEEDED FOR HEADACHE 12/24/20   Nelwyn Salisbury, MD  clopidogrel (PLAVIX) 75 MG tablet TAKE 1 TABLET BY MOUTH EVERY DAY 02/24/20   Nelwyn Salisbury, MD  fluticasone (FLONASE) 50 MCG/ACT nasal spray Place 1-2 sprays into both nostrils daily. 06/11/20   Wieters, Hallie C, PA-C  levothyroxine (SYNTHROID) 125 MCG tablet TAKE 1 TABLET (125 MCG TOTAL) BY MOUTH DAILY BEFORE BREAKFAST. 08/24/20   Nelwyn Salisbury, MD  losartan (COZAAR) 100 MG tablet Take 1 tablet (100 mg total) by mouth daily. 08/12/20   Lewayne Bunting, MD  metoprolol succinate (TOPROL-XL) 25 MG 24 hr tablet TAKE 1 TABLET BY MOUTH EVERY DAY. NEED APPT FOR REFILLS 12/23/20   Lewayne Bunting, MD  tetrahydrozoline 0.05 % ophthalmic solution Place 1 drop into both eyes 4 (four) times daily as needed (for dry/irritated eyes.).    [provider]  traMADol (ULTRAM) 50 MG tablet TAKE 1 TO 2 TABLETS BY MOUTH EVERY 4 TO 6 HOURS AS NEEDED FOR PAIN 08/06/20   Nelwyn Salisbury, MD  potassium chloride (KLOR-CON) 10 MEQ tablet TAKE 1 TABLET BY MOUTH EVERY DAY 05/26/20 06/11/20  Nelwyn Salisbury, MD  pregabalin (LYRICA) 75 MG capsule Take 1 capsule (75 mg total) by mouth at bedtime. 04/01/20 06/11/20  Nelwyn Salisbury, MD    Family History    Family History  Problem Relation Age of Onset   Arrhythmia Mother    Hypertension Mother    Stroke Mother    Stroke  Maternal Grandmother    Diabetes Paternal Grandmother    Breast cancer Sister        12s   Breast cancer Sister        34s   She indicated that her mother is deceased. She indicated that her father is deceased. She indicated that her maternal grandmother is deceased. She indicated that her maternal grandfather is deceased. She indicated that her paternal grandmother is deceased. She indicated that her paternal grandfather is deceased.  Social History    Social History   Socioeconomic History   Marital status: Married    Spouse name: Not on file   Number of children: Not on file   Years of education: Not on file   Highest education level: Not on file  Occupational History   Not on file  Tobacco Use   Smoking status: Former    Packs/day: 0.50    Years: 5.00    Pack years: 2.50    Types: Cigarettes    Quit date: 09/19/1968    Years since quitting: 52.9   Smokeless tobacco: Never  Vaping Use   Vaping Use: Never used  Substance and Sexual Activity   Alcohol use: Yes    Alcohol/week: 2.0 standard drinks    Types: 1 Cans of beer, 1 Shots of liquor per week    Comment: rarely   Drug use: No   Sexual activity: Yes  Other Topics Concern   Not on file  Social History Narrative   Lives in [place] with her husband.  She does not routinely exercise.   Social Determinants  of Health   Financial Resource Strain: Not on file  Food Insecurity: Not on file  Transportation Needs: Not on file  Physical Activity: Not on file  Stress: Not on file  Social Connections: Not on file  Intimate Partner Violence: Not on file     Review of Systems    General:  No chills, fever, night sweats or weight changes.  Cardiovascular:  No chest pain, dyspnea on exertion, edema, orthopnea, palpitations, paroxysmal nocturnal dyspnea. Dermatological: No rash, lesions/masses Respiratory: No cough, dyspnea Urologic: No hematuria, dysuria Abdominal:   No nausea, vomiting, diarrhea, bright red blood per  rectum, melena, or hematemesis Neurologic:  No visual changes, wkns, changes in mental status. All other systems reviewed and are otherwise negative except as noted above.  Physical Exam    VS:  BP 130/72   Pulse (!) 52   Ht 5' 5.5" (1.664 m)   Wt 203 lb 9.6 oz (92.4 kg)   LMP  (LMP Unknown)   SpO2 97%   BMI 33.37 kg/m  , BMI Body mass index is 33.37 kg/m. GEN: Well nourished, well developed, in no acute distress. HEENT: normal. Neck: Supple, no JVD, carotid bruits, or masses. Cardiac: RRR, no murmurs, rubs, or gallops. No clubbing, cyanosis, edema.  Radials/DP/PT 2+ and equal bilaterally.  Respiratory:  Respirations regular and unlabored, clear to auscultation bilaterally. GI: Soft, nontender, nondistended, BS + x 4. MS: no deformity or atrophy. Skin: warm and dry, no rash. Neuro:  Strength and sensation are intact. Psych: Normal affect.  Accessory Clinical Findings    Recent Labs: 11/02/2020: ALT 12; BUN 9; Creatinine, Ser 0.71; Hemoglobin 14.2; Platelets 163; Potassium 3.5; Sodium 138   Recent Lipid Panel    Component Value Date/Time   CHOL 167 04/01/2020 1402   CHOL 161 10/02/2019 1246   TRIG 78 04/01/2020 1402   HDL 77 04/01/2020 1402   HDL 74 10/02/2019 1246   CHOLHDL 2.2 04/01/2020 1402   VLDL 17.6 05/15/2018 1528   LDLCALC 74 04/01/2020 1402    ECG personally reviewed by me today-sinus bradycardia ST and T wave abnormality consider inferior ischemia 54 bpm no acute changes.  EKG 09/06/2019 sinus bradycardia 52 bpm- No acute changes   EKG 04/16/2019 Sinus bradycardia LVH 58 bpm   Echocardiogram 12/07/2017 Study Conclusions   - Left ventricle: The cavity size was normal. There was mild focal   basal hypertrophy of the septum. Systolic function was vigorous.   The estimated ejection fraction was in the range of 65% to 70%.   Wall motion was normal; there were no regional wall motion   abnormalities. Doppler parameters are consistent with abnormal   left  ventricular relaxation (grade 1 diastolic dysfunction). - Aortic valve: Trileaflet; mildly thickened, mildly calcified   leaflets.   Cardiac event monitor 06/10/2019 Sinus bradycardia, normal sinus rhythm, PVCs, 4 and 5 beats of nonsustained ventricular tachycardia.   Cardiac catheterization 06/01/2015 Prox RCA lesion, 10% stenosed. Prox Cx lesion, 10% stenosed. Prox LAD to Dist LAD lesion, 10% stenosed. The left ventricular systolic function is normal.   1. Mild non-obstructive CAD 2. Normal LV systolic function 3. Non-cardiac chest pain   Recommendations: Medical management of mild CAD.   Assessment & Plan   1.  Essential hypertension-BP today 130/72.  Has had elevated blood pressure at home in the 150s over 90s.   Continue losartan, metoprolol,  Increase amlodipine  Heart healthy low-sodium diet-salty 6 reviewed Increase physical activity as tolerated Maintain blood pressure log. Continue weight  loss   Hyperlipidemia-LDL 74 04/01/20 Continue  atorvastatin  Heart healthy high-fiber low-sodium diet Increase physical activity as tolerated  Repeat lipids and lfts   Chest discomfort-reports occasional episodes of chest pressure and tightness.  She notices these episodes both at rest and with physical activity.  They last 5 to 10 minutes and dissipate without intervention.  Her symptoms appear to be coinciding with irregular heartbeat.  Cardiac catheterization 05/2015 showed mild nonobstructive coronary artery disease, normal LV function and CP was believed to be noncardiac.   EKG today shows sinus bradycardia minimal voltage criteria for LVH 52 bpm.   Increase amlodipine to 5 mg daily Increase physical activity Heart healthy low-sodium diet-salty 6 reviewed No plans for ischemic evaluation at this time.  If symptoms increase/worsen would recommend coronary CTA. 14-day Zio monitor  Palpitations-chest discomfort symptoms appear to be associated with irregular heartbeat.    Cardiac  event monitor 05/2019 sinus bradycardia, normal sinus rhythm, PVCs, 4 and 5 beats of nonsustained VT.  Patient started onMetoprolol succinate 25 mg daily 06/25/2019. Continue metoprolol succinate 25 mg tablet daily Avoid triggers-caffeine, chocolate, EtOH, dehydration etc. Repeat 14-day Zio monitor.   History of cerebrovascular accident- Event in 2006  Continue clopidogrel 75 mg tablet daily Follows with neurology  Obesity-weight today 203 pounds.  Has struggled with fluctuations in weight.  Reports she has tried to lose weight with dieting. Continue weight loss     Disposition: Follow-up with Dr. Stanford Breed or me in 6-8 weeks   Jossie Ng. Cristal Qadir NP-C    08/18/2021, 4:27 PM [address] [place] [address] Office [phone] Fax [phone]  Notice: This dictation was prepared with Dragon dictation along with smaller phrase technology. Any transcriptional errors that result from this process are unintentional and may not be corrected upon review.  I spent 15 minutes examining this patient, reviewing medications, and using patient centered shared decision making involving her cardiac care.  Prior to her visit I spent greater than 20 minutes reviewing her past medical history,  medications, and prior cardiac tests.

## 2021-08-18 ENCOUNTER — Ambulatory Visit (INDEPENDENT_AMBULATORY_CARE_PROVIDER_SITE_OTHER): Payer: Medicare PPO

## 2021-08-18 ENCOUNTER — Other Ambulatory Visit: Payer: Self-pay | Admitting: General Practice

## 2021-08-18 ENCOUNTER — Encounter: Payer: Self-pay | Admitting: General Practice

## 2021-08-18 ENCOUNTER — Other Ambulatory Visit: Payer: Self-pay

## 2021-08-18 ENCOUNTER — Ambulatory Visit: Payer: Medicare PPO | Admitting: General Practice

## 2021-08-18 VITALS — BP 130/72 | HR 52 | Ht 65.5 in | Wt 203.6 lb

## 2021-08-18 DIAGNOSIS — R002 Palpitations: Secondary | ICD-10-CM

## 2021-08-18 DIAGNOSIS — R072 Precordial pain: Secondary | ICD-10-CM

## 2021-08-18 DIAGNOSIS — Z6833 Body mass index (BMI) 33.0-33.9, adult: Secondary | ICD-10-CM

## 2021-08-18 DIAGNOSIS — I10 Essential (primary) hypertension: Secondary | ICD-10-CM | POA: Diagnosis not present

## 2021-08-18 DIAGNOSIS — E7800 Pure hypercholesterolemia, unspecified: Secondary | ICD-10-CM

## 2021-08-18 DIAGNOSIS — I639 Cerebral infarction, unspecified: Secondary | ICD-10-CM | POA: Diagnosis not present

## 2021-08-18 DIAGNOSIS — Z79899 Other long term (current) drug therapy: Secondary | ICD-10-CM | POA: Diagnosis not present

## 2021-08-18 MED ORDER — AMLODIPINE BESYLATE 5 MG PO TABS: 5.0000 mg | ORAL_TABLET | Freq: Every day | ORAL | 6 refills | Status: DC

## 2021-08-18 NOTE — Patient Instructions (Signed)
Medication Instructions:  INCREASE AMLODIPINE 5MG  DAILY *If you need a refill on your cardiac medications before your next appointment, please call your pharmacy*  Lab Work: FASTING LIPID AND LFT If you have labs (blood work) drawn today and your tests are completely normal, you will receive your results only by:  MyChart Message (if you have MyChart) OR A paper copy in the mail.  If you have any lab test that is abnormal or we need to change your treatment, we will call you to review the results. You may go to any [facility] that is convenient for you however, we do have a lab in our office that is able to assist you. You DO NOT need an appointment for our lab. The lab is open 8:00am and closes at 4:00pm. Lunch 12:45 - 1:45pm.  Testing/Procedures: ZIO MONITOR-SEE ATTACHED  Special Instructions DO NOT CHANGE YOUR ROUTINE  TAKE AND LOG YOUR BLOOD PRESSURE-1 HOUR AFTER TAKING YOUR MEDICATION  INCREASE YOUR HYDRATION  PLEASE INCREASE PHYSICAL ACTIVITY AS TOLERATED  Please try to avoid these triggers: Do not use any products that have nicotine or tobacco in them. These include cigarettes, e-cigarettes, and chewing tobacco. If you need help quitting, ask your doctor. Eat heart-healthy foods. Talk with your doctor about the right eating plan for you. Exercise regularly as told by your doctor. Stay hydrated Do not drink alcohol, Caffeine or chocolate. Lose weight if you are overweight. Do not use drugs, including cannabis   Follow-Up: Your next appointment:  6-8 week(s) In Person with , MD  or [name], FNP or Edd Fabian, NP    :1  At [facility], you and your health needs are our priority.  As part of our continuing mission to provide you with exceptional heart care, we have created designated Provider Care Teams.  These Care Teams include your primary Cardiologist (physician) and Advanced Practice Providers (APPs -  Physician Assistants and Nurse Practitioners) who all  work together to provide you with the care you need, when you need it.   ZIO XT- Long Term Monitor Instructions  Your physician has requested you wear a ZIO patch monitor for 14 days.  This is a single patch monitor. [employer] supplies one patch monitor per enrollment. Additional stickers are not available. Please do not apply patch if you will be having a Nuclear Stress Test,  Echocardiogram, Cardiac CT, MRI, or Chest Xray during the period you would be wearing the  monitor. The patch cannot be worn during these tests. You cannot remove and re-apply the  ZIO XT patch monitor.  Your ZIO patch monitor will be mailed 3 day USPS to your address on file. It may take 3-5 days  to receive your monitor after you have been enrolled.  Once you have received your monitor, please review the enclosed instructions. Your monitor  has already been registered assigning a specific monitor serial # to you.  Billing and Patient Assistance Program Information  We have supplied [employer] with any of your insurance information on file for billing purposes. [employer] offers a sliding scale Patient Assistance Program for patients that do not have  insurance, or whose insurance does not completely cover the cost of the ZIO monitor.  You must apply for the Patient Assistance Program to qualify for this discounted rate.  To apply, please call [employer] at [phone], select option 4, select option 2, ask to apply for  Patient Assistance Program. [phone] will ask your household income, and how many people  are in your household. They will quote your out-of-pocket cost based on that information.  [employer] will also be able to set up a 46-month, interest-free payment plan if needed.  Applying the monitor   Shave hair from upper left chest.  Hold abrader disc by orange tab. Rub abrader in 40 strokes over the upper left chest as  indicated in your monitor instructions.  Clean area with 4 enclosed alcohol pads. Let dry.   Apply patch as indicated in monitor instructions. Patch will be placed under collarbone on left  side of chest with arrow pointing upward.  Rub patch adhesive wings for 2 minutes. Remove white label marked "1". Remove the white  label marked "2". Rub patch adhesive wings for 2 additional minutes.  While looking in a mirror, press and release button in center of patch. A small green light will  flash 3-4 times. This will be your only indicator that the monitor has been turned on.  Do not shower for the first 24 hours. You may shower after the first 24 hours.  Press the button if you feel a symptom. You will hear a small click. Record Date, Time and  Symptom in the Patient Logbook.  When you are ready to remove the patch, follow instructions on the last 2 pages of Patient  Logbook. Stick patch monitor onto the last page of Patient Logbook.  Place Patient Logbook in the blue and white box. Use locking tab on box and tape box closed  securely. The blue and white box has prepaid postage on it. Please place it in the mailbox as  soon as possible. Your physician should have your test results approximately 7 days after the  monitor has been mailed back to [facility].  Call [facility] Customer Care at [phone] if you have questions regarding  your ZIO XT patch monitor. Call them immediately if you see an orange light blinking on your  monitor.  If your monitor falls off in less than 4 days, contact our Monitor department at [phone].  If your monitor becomes loose or falls off after 4 days call [employer] at [phone] for  suggestions on securing your monitor

## 2021-08-18 NOTE — Progress Notes (Unsigned)
Enrolled patient for a 14 day Zio XT monitor to be mailed to patients home  [place] to read 

## 2021-08-20 DIAGNOSIS — Z6833 Body mass index (BMI) 33.0-33.9, adult: Secondary | ICD-10-CM | POA: Diagnosis not present

## 2021-08-20 DIAGNOSIS — R002 Palpitations: Secondary | ICD-10-CM | POA: Diagnosis not present

## 2021-08-20 DIAGNOSIS — Z79899 Other long term (current) drug therapy: Secondary | ICD-10-CM | POA: Diagnosis not present

## 2021-08-20 DIAGNOSIS — R072 Precordial pain: Secondary | ICD-10-CM | POA: Diagnosis not present

## 2021-08-20 DIAGNOSIS — E7800 Pure hypercholesterolemia, unspecified: Secondary | ICD-10-CM | POA: Diagnosis not present

## 2021-08-20 DIAGNOSIS — I639 Cerebral infarction, unspecified: Secondary | ICD-10-CM | POA: Diagnosis not present

## 2021-08-20 DIAGNOSIS — I10 Essential (primary) hypertension: Secondary | ICD-10-CM | POA: Diagnosis not present

## 2021-08-20 LAB — HEPATIC FUNCTION PANEL
ALT: 13 IU/L (ref 0–32)
AST: 13 IU/L (ref 0–40)
Albumin: 4.3 g/dL (ref 3.7–4.7)
Alkaline Phosphatase: 127 IU/L — ABNORMAL HIGH (ref 44–121)
Bilirubin Total: 0.3 mg/dL (ref 0.0–1.2)
Bilirubin, Direct: <0.1 mg/dL (ref 0.00–0.40)
Total Protein: 7.5 g/dL (ref 6.0–8.5)

## 2021-08-20 LAB — LIPID PANEL
Chol/HDL Ratio: 2.2 ratio (ref 0.0–4.4)
Cholesterol, Total: 182 mg/dL (ref 100–199)
HDL: 84 mg/dL (ref >39)
LDL Chol Calc (NIH): 82 mg/dL (ref 0–99)
Triglycerides: 87 mg/dL (ref 0–149)
VLDL Cholesterol Cal: 16 mg/dL (ref 5–40)

## 2021-08-22 DIAGNOSIS — R002 Palpitations: Secondary | ICD-10-CM

## 2021-08-22 DIAGNOSIS — R072 Precordial pain: Secondary | ICD-10-CM

## 2021-08-29 ENCOUNTER — Other Ambulatory Visit: Payer: Self-pay | Admitting: Family Medicine

## 2021-09-10 ENCOUNTER — Other Ambulatory Visit: Payer: Self-pay | Admitting: Family Medicine

## 2021-09-17 ENCOUNTER — Other Ambulatory Visit: Payer: Self-pay | Admitting: Cardiology

## 2021-09-17 DIAGNOSIS — I10 Essential (primary) hypertension: Secondary | ICD-10-CM

## 2021-09-26 ENCOUNTER — Other Ambulatory Visit: Payer: Self-pay | Admitting: Cardiology

## 2021-09-27 ENCOUNTER — Other Ambulatory Visit: Payer: Self-pay

## 2021-09-27 ENCOUNTER — Ambulatory Visit: Payer: Medicare PPO | Admitting: Neurology

## 2021-09-27 ENCOUNTER — Encounter: Payer: Self-pay | Admitting: Neurology

## 2021-09-27 VITALS — BP 167/95 | HR 54 | Ht 65.5 in | Wt 206.8 lb

## 2021-09-27 DIAGNOSIS — E7849 Other hyperlipidemia: Secondary | ICD-10-CM

## 2021-09-27 DIAGNOSIS — R2681 Unsteadiness on feet: Secondary | ICD-10-CM | POA: Diagnosis not present

## 2021-09-27 DIAGNOSIS — I6990 Unspecified sequelae of unspecified cerebrovascular disease: Secondary | ICD-10-CM | POA: Diagnosis not present

## 2021-09-27 NOTE — Patient Instructions (Signed)
I had a long discussion the patient with regards to her dizziness and gait imbalance and discussed differential diagnosis and plan for evaluation and answered questions.  I recommend further evaluation with checking MRI scan of the brain as well as MRA of the brain and neck to rule out interval new strokes or occlusive cerebrovascular disease.  Check EMG nerve conduction study for neuropathy.  Continue Plavix for stroke prevention and maintain aggressive risk factor modification with strict control of hypertension with blood pressure goal below 130/90, lipids with LDL cholesterol goal below 70 mg percent and diabetes with hemoglobin A1c goal below 6.5%.  She was encouraged to use a cane at all times and to get up slowly and avoid sudden movements.  We also discussed fall safety precautions.  She will return for follow-up in the future in 3 to 4 months or call earlier if necessary.Fall Prevention in the Home, Adult Falls can cause injuries and can happen to people of all ages. There are many things you can do to make your home safe and to help prevent falls. Ask for help when making these changes. What actions can I take to prevent falls? General Instructions Use good lighting in all rooms. Replace any light bulbs that burn out. Turn on the lights in dark areas. Use night-lights. Keep items that you use often in easy-to-reach places. Lower the shelves around your home if needed. Set up your furniture so you have a clear path. Avoid moving your furniture around. Do not have throw rugs or other things on the floor that can make you trip. Avoid walking on wet floors. If any of your floors are uneven, fix them. Add color or contrast paint or tape to clearly mark and help you see: Grab bars or handrails. First and last steps of staircases. Where the edge of each step is. If you use a stepladder: Make sure that it is fully opened. Do not climb a closed stepladder. Make sure the sides of the stepladder are  locked in place. Ask someone to hold the stepladder while you use it. Know where your pets are when moving through your home. What can I do in the bathroom?   Keep the floor dry. Clean up any water on the floor right away. Remove soap buildup in the tub or shower. Use nonskid mats or decals on the floor of the tub or shower. Attach bath mats securely with double-sided, nonslip rug tape. If you need to sit down in the shower, use a plastic, nonslip stool. Install grab bars by the toilet and in the tub and shower. Do not use towel bars as grab bars. What can I do in the bedroom? Make sure that you have a light by your bed that is easy to reach. Do not use any sheets or blankets for your bed that hang to the floor. Have a firm chair with side arms that you can use for support when you get dressed. What can I do in the kitchen? Clean up any spills right away. If you need to reach something above you, use a step stool with a grab bar. Keep electrical cords out of the way. Do not use floor polish or wax that makes floors slippery. What can I do with my stairs? Do not leave any items on the stairs. Make sure that you have a light switch at the top and the bottom of the stairs. Make sure that there are handrails on both sides of the stairs. Fix handrails  that are broken or loose. Install nonslip stair treads on all your stairs. Avoid having throw rugs at the top or bottom of the stairs. Choose a carpet that does not hide the edge of the steps on the stairs. Check carpeting to make sure that it is firmly attached to the stairs. Fix carpet that is loose or worn. What can I do on the outside of my home? Use bright outdoor lighting. Fix the edges of walkways and driveways and fix any cracks. Remove anything that might make you trip as you walk through a door, such as a raised step or threshold. Trim any bushes or trees on paths to your home. Check to see if handrails are loose or broken and that  both sides of all steps have handrails. Install guardrails along the edges of any raised decks and porches. Clear paths of anything that can make you trip, such as tools or rocks. Have leaves, snow, or ice cleared regularly. Use sand or salt on paths during winter. Clean up any spills in your garage right away. This includes grease or oil spills. What other actions can I take? Wear shoes that: Have a low heel. Do not wear high heels. Have rubber bottoms. Feel good on your feet and fit well. Are closed at the toe. Do not wear open-toe sandals. Use tools that help you move around if needed. These include: Canes. Walkers. Scooters. Crutches. Review your medicines with your doctor. Some medicines can make you feel dizzy. This can increase your chance of falling. Ask your doctor what else you can do to help prevent falls. Where to find more information Centers for Disease Control and Prevention, [employer]: FootballExhibition.com.br [employer] on Aging: https://walker.com/ Contact a doctor if: You are afraid of falling at home. You feel weak, drowsy, or dizzy at home. You fall at home. Summary There are many simple things that you can do to make your home safe and to help prevent falls. Ways to make your home safe include removing things that can make you trip and installing grab bars in the bathroom. Ask for help when making these changes in your home. This information is not intended to replace advice given to you by your health care provider. Make sure you discuss any questions you have with your health care provider. Document Revised: 04/08/2020 Document Reviewed: 04/08/2020 Elsevier Patient Education  2022 [employer].

## 2021-09-27 NOTE — Progress Notes (Signed)
[facility] [address] [place]. [place] [address] [phone]       OFFICE FOLLOW-UP NOTE  Abigail. Abigail [name] Date of Birth:  [birth year] Medical Record Number:  [id number]   HPI: Update 09/27/2021 Abigail [name] is a 76 year old African-American lady with longstanding history of a multifactorial gait and balance difficulties due to combination of old right pontine and cerebellar strokes as well as left thalamic strokes.  Vascular risk factors of hypertension, hyperlipidemia, history of sleep apnea and multiple strokes.  She had seen me in the clinic several times with last visit being on 04/01/2019.  She returns today for worsening of her existing symptoms.  For the last 9 months she has noticed that her balance is difficult.  She tires easily.  She has been using a cane almost constantly.  She still feels that she is starting to 1 side and not sure of her balance.  She does have chronic low back pain and right hip pain.  She has seen Dr. Vertell Limber in the past she had refused back surgery.  She continues to have severe right hip and thigh pain which also limits her walking.  She recently had significant elevation in the blood pressure and saw her cardiologist who increased her medications.  She denies any tingling numbness or pain or burning in her feet.  She however had an episode of gout and painful swollen toes for which she saw her primary physician who gave her a course of steroids which seems to have helped.  She has not had any recent visits with her orthopedic surgeon or with Dr. Vertell Limber her back surgeon.  Review of electronic medical records show that she had lab work on 08/20/2021 which showed LDL cholesterol of 82 mg percent.  Her last hemoglobin A1c was on 11/24/20) 6.1.  Last carotid ultrasound was on 04/06/2019 and was unremarkable. Initial Consult 06/03/2015 :  Abigail [name] is a 4 year pleasant lady whose had five-month history of gait and balance difficulties. She just feels she often leans to  the left and stumbles if she is not careful. She feels the symptoms began suddenly one day when she noticed trouble with balance and walking she also had some trouble swallowing but she did not seek immediate help at that time. She does have a prior history of her left thalamic lacunar infarct in 2007 for which she had seen me. That was felt to be due to small vessel disease and she was started on Aggrenox which did not tolerate due to side effects. She was lost to medical follow-up to me. She is recently had an outpatient MRI scan of the brain which have personally reviewed on 05/02/15 which does show a right pontine and cerebellar hyperintensity which is likely a age indeterminate lacunar infarcts which were not noticed on the previous MRI and perhaps may be the explanation of her recent dizziness and gait difficulties. The patient is currently on Plavix which is tolerating well without bleeding or bruising. She states her blood pressure has been difficult to control and today it is elevated at 171/97 in office. She did also had some recent chest pain or shortness of breath for which she underwent cardiac catheterization last Monday which did not show any correctable lesion. Dr. Stanford Breed for cardiologist has also added a baby aspirin and she seems to be having increased bruising tendency since then. She's not sure when her last lipid profile was checked but she does take Lipitor 40 mg daily which is  tolerating well without side effects. She does have history of sleep apnea and had surgery for that and does not use CPAP Update 11/17/15 : She returns for follow-up after last visit 5 months ago. She is accompanied by husband. Patient states she was doing fine until a few weeks ago since then she's noticed intermittent tingling in the left hand as well as weakness and dropping of objects from that hand. She's also noticed some walking difficulty and having to drag her left leg. She denies any slurred speech, headache,  fall or injury. She was seen by me in September of last year and at that time MRI scan of the brain prior to that visit had shown small remote age lacunar infarcts in the right cerebellum and pons. I had ordered MRA of the neck and brain which have personally reviewed done on 06/03/15 which showed no significant intra-or extracranial stenosis. Lab work on 06/03/15 showed LDL cholesterol 84 but HDL of 77 with the ratio of 2.3. Hemoglobin A1c was borderline at 6.0. Patient has been on Plavix since 2006 following her previous stroke. She was unable to tolerate Aggrenox at that time. Patient states she is having gradual worsening of her balance and walking since spring of last year and perhaps had a small stroke at that time which was not recognized. She states she is tolerating Plavix well without bleeding or bruising. Her blood pressure is well controlled and today it is 139/76. The patient is refusing a referral to physical outpatient therapy but she wants to go and participate in the Silver sneakers program at the [facility]. She is tolerating Lipitor without myalgias or arthralgias. She does use a CPAP for sleep apnea. Update 05/19/2017 ; she returns for follow-up after last visit for year and a half ago. She however was admitted to the hospital in June 2018 with a right lip and hand paresthesias. MRI scan did show a small left thalamic lacunar infarct. MRA of the brain was unremarkable. Carotid Doppler showed no significant extracranial stenosis. Transthoracic echo showed normal ejection fraction. LDL cholesterol was borderline at 72 mg percent. Hemoglobin A1c was 6.1. Patient was started on aspirin 81 and Plavix 75 mg per 3 weeks and since then she has discontinued aspirin and is currently on Plavix alone. She is tolerating well without bruising or bleeding. She states her right lip numbness has resolved but right little finger in [place] persists. She has seen Dr. Vertell Limber for back pain and he has discontinued both) and  replace it with Aleve and tramadol. Patient also complains of subjective mild memory difficulties since her stroke. She has trouble remembering recent information  and names. Update 10/31/2018 : She returns for follow-up after last visit a year and half ago.  She is accompanied by her husband.  Patient is doing well from stroke standpoint without recurrent stroke or TIA symptoms.  She states she had hip replacement surgery in December 2019 and a few days later developed leg pain and swelling and was diagnosed as femoral vein deep vein thrombosis.  She has since stopped Plavix and has been switched to Xarelto which she seems to be tolerating well without bleeding or bruising.  She states her blood pressure is under good control though today it is slightly elevated at 1 four 8/83.  She is tolerating Lipitor well without any side effects.  Her last lipid profile was satisfactory.  She has no new neurological complaints.  She is walking with a cane and has had no falls or  injuries.  She states her mild subjective memory difficulties are unchanged and are not getting worse Update 04/01/2019 : She returns for follow-up after last visit 5 months ago.  She has not had any recurrent stroke or TIA symptoms.  She continues to have back pain and hip pain and walking difficulties mostly related to her bad hip.  She had hip replacement surgery in December but has had persistent pain.  She recently underwent epidural back injection by orthopedic surgeon without significant relief.  She complains of difficulty swallowing for the last 1 month which is intermittent and occurs once or twice a week.  This is mainly when she is trying to swallow liquids.  She initially has trouble initiating it but once she can swallow the fluid goes down well.  She denies any nasal regurgitation coughing or gagging.  She is tolerating Plavix well with only minor bruising and no bleeding.  Her blood pressures well controlled and today it is 140/78.  She  remains on Lipitor which is tolerating well and states lipid profile was checked only last year was satisfactory.  She has not had follow-up carotid ultrasound and for more than a year.  ROS:   14 system review of systems is positive for hip pain, thigh pain, difficulty walking, imbalance, back pain, gout, trouble swallowing, headache all other systems negative  PMH:  Past Medical History:  Diagnosis Date   Bronchitis 09/2016   Bruises easily    DDD (degenerative disc disease)    Degenerative joint disease of cervical spine 09-05-11   Cervival area, now some osteoarthritis-lower back and Rt. shoulder   Depression    Diverticulitis 09-05-11   hx. gastritis, diverticulitis x2 -now surgery planned   DIVERTICULITIS, ACUTE 02/10/2010   Dyspnea    with exertion   Essential hypertension 09-05-11   tx. Verapamil   GROIN PAIN 09/21/2010   Headache(784.0)    headaches are better   Hearing loss    right ear   Heart palpitations    Hemorrhoids    History of bronchitis    History of colonic polyps    History of pneumonia    History of tension headache    Hyperlipidemia    Hypertension    Hypothyroidism 09-05-11   Supplement used   Late effect of adverse effect of drug, medicinal or biological substance    NECK PAIN, ACUTE 09/21/2010   Neuromuscular disorder ([place])    carpal tunnel in both hands   Nocturia    Osteoarthritis 112-17-12   spine and rt. hip, rt. shoulder   Patent foramen ovale    Small - unable to be closed -    Pneumonia    PONV (postoperative nausea and vomiting)    Sleep apnea 09-05-11   no cpap ever, had surgery to remove cartilage, no problems now   SORE THROAT 04/30/2009   Stroke ([place]) 09-05-11   2006/2009-(loss of memory, balance issues remains occ.)   TIA 09/28/2007, 10/2014   Unstable angina ([place])    a. 05/2010 Cath: nl cors, EF 55%;  b. 05/2015 Lexiscan MV: small, severe, fixed apical defect and a small, severe, reversible inf lateral defect w/ apical thinning and  mild ischemia, EF 54%.   URI 09/02/2008   Vertigo     Social History:  Social History   Socioeconomic History   Marital status: Married    Spouse name: Not on file   Number of children: Not on file   Years of education: Not on file  Highest education level: Not on file  Occupational History   Not on file  Tobacco Use   Smoking status: Former    Packs/day: 0.50    Years: 5.00    Pack years: 2.50    Types: Cigarettes    Quit date: 09/19/1968    Years since quitting: 53.0   Smokeless tobacco: Never  Vaping Use   Vaping Use: Never used  Substance and Sexual Activity   Alcohol use: Yes    Alcohol/week: 2.0 standard drinks    Types: 1 Cans of beer, 1 Shots of liquor per week    Comment: rarely   Drug use: No   Sexual activity: Yes  Other Topics Concern   Not on file  Social History Narrative   Lives in [place] with her husband.  She does not routinely exercise.   Social Determinants of Health   Financial Resource Strain: Not on file  Food Insecurity: Not on file  Transportation Needs: Not on file  Physical Activity: Not on file  Stress: Not on file  Social Connections: Not on file  Intimate Partner Violence: Not on file    Medications:   Current Outpatient Medications on File Prior to Visit  Medication Sig Dispense Refill   amLODipine (NORVASC) 10 MG tablet Take 10 mg by mouth daily.     atorvastatin (LIPITOR) 40 MG tablet TAKE 1 TABLET (40 MG TOTAL) BY MOUTH DAILY. *LABS NEEDED FOR FUTURE REFILLS 90 tablet 0   butalbital-acetaminophen-caffeine (FIORICET) 50-325-40 MG tablet TAKE 1 TABLET BY MOUTH EVERY 6 HOURS AS NEEDED FOR HEADACHE 60 tablet 5   clopidogrel (PLAVIX) 75 MG tablet TAKE 1 TABLET BY MOUTH EVERY DAY 90 tablet 1   fluticasone (FLONASE) 50 MCG/ACT nasal spray Place 1-2 sprays into both nostrils daily. 16 g 0   furosemide (LASIX) 20 MG tablet Take 1 tablet (20 mg total) by mouth daily. 30 tablet 5   levothyroxine (SYNTHROID) 125 MCG tablet TAKE 1 TABLET  (125 MCG TOTAL) BY MOUTH DAILY BEFORE BREAKFAST. 90 tablet 3   levothyroxine (SYNTHROID) 125 MCG tablet TAKE 1 TABLET (125 MCG TOTAL) BY MOUTH DAILY BEFORE BREAKFAST. 90 tablet 1   losartan (COZAAR) 100 MG tablet TAKE 1 TABLET BY MOUTH EVERY DAY 90 tablet 3   metoprolol succinate (TOPROL-XL) 25 MG 24 hr tablet TAKE 1 TABLET BY MOUTH EVERY DAY. NEED APPT FOR REFILLS 30 tablet 2   tetrahydrozoline 0.05 % ophthalmic solution Place 1 drop into both eyes 4 (four) times daily as needed (for dry/irritated eyes.).     traMADol (ULTRAM) 50 MG tablet TAKE 1 TO 2 TABLETS BY MOUTH EVERY 4 TO 6 HOURS AS NEEDED FOR PAIN 120 tablet 5   [DISCONTINUED] potassium chloride (KLOR-CON) 10 MEQ tablet TAKE 1 TABLET BY MOUTH EVERY DAY 90 tablet 1   [DISCONTINUED] pregabalin (LYRICA) 75 MG capsule Take 1 capsule (75 mg total) by mouth at bedtime. 14 capsule 0   No current facility-administered medications on file prior to visit.    Allergies:   Allergies  Allergen Reactions   Dilaudid [Hydromorphone Hcl] Shortness Of Breath, Nausea And Vomiting and Other (See Comments)    Able to take morphine without issue   Shellfish Allergy Shortness Of Breath and Rash    Only shrimp allergy   Aggrenox [Aspirin-Dipyridamole Er] Nausea And Vomiting and Other (See Comments)    Headache    Codeine Nausea And Vomiting   Doxycycline Monohydrate Diarrhea   Cephalexin Itching and Rash   Codeine Phosphate Nausea  And Vomiting   Hydrocodone Nausea And Vomiting and Other (See Comments)    Can take morphine without issue   Meperidine Hcl Nausea And Vomiting   Percocet [Oxycodone-Acetaminophen] Nausea And Vomiting and Other (See Comments)    Can take morphine without issue    Physical Exam  Vitals:   09/27/21 1446  BP: (!) 167/95  Pulse: (!) 54   General: Mildly obese elderly African-American lady , seated, in no evident distress Head: head normocephalic and atraumatic.   Neck: supple with no carotid or supraclavicular  bruits Cardiovascular: regular rate and rhythm, no murmurs Musculoskeletal: no deformity Skin:  no rash/petichiae Vascular:  Normal pulses all extremities   Neurologic Exam Mental Status: Awake and fully alert. Oriented to place and time. Recent and remote memory intact. Attention span, concentration and fund of knowledge appropriate. Mood and affect appropriate.  Cranial Nerves: Fundoscopic exam not done. Pupils equal, briskly reactive to light. Extraocular movements full without nystagmus. Visual fields full to confrontation. Hearing intact. Facial sensation intact. Face, tongue, palate moves normally and symmetrically.  Motor: Normal bulk and tone.Mild weakness of left grip and intrinsic hand muscles. Orbits right over left upper extremity. Mild weakness of right hip flexors, extensors and adductor's due to pain,   Sensory.: intact to touch , pinprick , position and vibratory sensation. Mild subjective paresthesias in the right little finger but no objective sensory loss Coordination: Rapid alternating movements normal in all extremities. Finger-to-nose and heel-to-shin performed accurately bilaterally. Gait and Station: Arises from chair without difficulty. Stance is slightly broad-based Gait demonstrates favoring of the right hip due to pain and stiffness and dragging of the right leg.. Unable to heel, toe and tandem walk without difficulty.  She uses a cane for walking. Reflexes: 1+ and symmetric. Toes downgoing.   ng.       ASSESSMENT: 76 year old lady with 3-year history of worsening chronic multifactorial gait and balance difficulties following remote right pontine and cerebellar infarcts in 2016 as well as left thalamic infarct in 2007 which appeared to have worsened the last 9 months or so.  She also has significant degenerative spine disease and right hip arthritis which is also contributing.     PLAN:I had a long discussion the patient with regards to her dizziness and gait  imbalance and discussed differential diagnosis and plan for evaluation and answered questions.  I recommend further evaluation with checking MRI scan of the brain as well as MRA of the brain and neck to rule out interval new strokes or occlusive cerebrovascular disease.  Check EMG nerve conduction study for any underlying neuropathy.  Continue Plavix for stroke prevention and maintain aggressive risk factor modification with strict control of hypertension with blood pressure goal below 130/90, lipids with LDL cholesterol goal below 70 mg percent and diabetes with hemoglobin A1c goal below 6.5%.  She was encouraged to use a cane at all times and to get up slowly and avoid sudden movements.  We also discussed fall safety precautions.  She will return for follow-up in the future in 3 to 4 months or call earlier if necessary Greater than 50% of time during this 50 minute prolonged visit was spent on counseling,explanation of diagnosis, planning of further management, discussion with patient and family and coordination of care Antony Contras MD Note: This document was prepared with digital dictation and possible smart phrase technology. Any transcriptional errors that result from this process are unintentional

## 2021-09-29 ENCOUNTER — Other Ambulatory Visit (INDEPENDENT_AMBULATORY_CARE_PROVIDER_SITE_OTHER): Payer: Self-pay

## 2021-09-29 ENCOUNTER — Telehealth: Payer: Self-pay | Admitting: Neurology

## 2021-09-29 DIAGNOSIS — E7849 Other hyperlipidemia: Secondary | ICD-10-CM | POA: Diagnosis not present

## 2021-09-29 DIAGNOSIS — Z0289 Encounter for other administrative examinations: Secondary | ICD-10-CM

## 2021-09-29 NOTE — Telephone Encounter (Signed)
[employer] pending uploaded notes  

## 2021-09-30 LAB — HEMOGLOBIN A1C
Est. average glucose Bld gHb Est-mCnc: 123 mg/dL
Hgb A1c MFr Bld: 5.9 % — ABNORMAL HIGH (ref 4.8–5.6)

## 2021-09-30 LAB — LIPID PANEL
Chol/HDL Ratio: 2.4 ratio (ref 0.0–4.4)
Cholesterol, Total: 184 mg/dL (ref 100–199)
HDL: 76 mg/dL (ref >39)
LDL Chol Calc (NIH): 92 mg/dL (ref 0–99)
Triglycerides: 86 mg/dL (ref 0–149)
VLDL Cholesterol Cal: 16 mg/dL (ref 5–40)

## 2021-10-04 NOTE — Telephone Encounter (Signed)
Abigail [name]: [id number] (exp. 09/29/21 to 10/29/21) order sent to GI, they will reach out to the patient to schedule.

## 2021-10-07 ENCOUNTER — Telehealth: Payer: Self-pay | Admitting: Neurology

## 2021-10-07 NOTE — Telephone Encounter (Signed)
It really is if the patient wants to have the contrast or not and if the provider would like to do the medicine protocol which is:  Prednisone 50 mg or methylprednisolone 32 mg PO at 13 hours, 7 hours, and 1 hour prior to study and Benadryl 50 mg PO at 1 hour prior to study.  Or the exam can be switched to without contrast.

## 2021-10-07 NOTE — Telephone Encounter (Signed)
Ladies the patient had an MRI scheduled at [facility] which was cancelled because she advised the scheduler there she is allergic to contrast. Dr. Leonie Man sent this request in which was approved by her insurance and she wanted to know if he could suggest something else so she can proceed with the MRI. Please advise and let her know how to proceed. Thank you

## 2021-10-11 ENCOUNTER — Other Ambulatory Visit: Payer: Self-pay | Admitting: *Deleted

## 2021-10-11 DIAGNOSIS — R2681 Unsteadiness on feet: Secondary | ICD-10-CM

## 2021-10-11 DIAGNOSIS — Z8679 Personal history of other diseases of the circulatory system: Secondary | ICD-10-CM

## 2021-10-11 DIAGNOSIS — Z8673 Personal history of transient ischemic attack (TIA), and cerebral infarction without residual deficits: Secondary | ICD-10-CM

## 2021-10-11 NOTE — Telephone Encounter (Signed)
Noted, thank you! Order's sent to GI, they will reach out to the patient to schedule.

## 2021-10-16 ENCOUNTER — Other Ambulatory Visit: Payer: Self-pay | Admitting: Family Medicine

## 2021-10-17 ENCOUNTER — Other Ambulatory Visit: Payer: Medicare PPO

## 2021-10-20 NOTE — Progress Notes (Signed)
Cardiology Clinic Note   Patient Name: Abigail [name] Date of Encounter: 10/21/2021  Primary Care Provider:  Laurey Morale, MD Primary Cardiologist:  Kirk Ruths, MD  Patient Profile    Abigail [name] 76 year old female presents today for a follow-up evaluation of her palpitations, hypertension, and chest discomfort.  Past Medical History    Past Medical History:  Diagnosis Date   Bronchitis 09/2016   Bruises easily    DDD (degenerative disc disease)    Degenerative joint disease of cervical spine 09-05-11   Cervival area, now some osteoarthritis-lower back and Rt. shoulder   Depression    Diverticulitis 09-05-11   hx. gastritis, diverticulitis x2 -now surgery planned   DIVERTICULITIS, ACUTE 02/10/2010   Dyspnea    with exertion   Essential hypertension 09-05-11   tx. Verapamil   GROIN PAIN 09/21/2010   Headache(784.0)    headaches are better   Hearing loss    right ear   Heart palpitations    Hemorrhoids    History of bronchitis    History of colonic polyps    History of pneumonia    History of tension headache    Hyperlipidemia    Hypertension    Hypothyroidism 09-05-11   Supplement used   Late effect of adverse effect of drug, medicinal or biological substance    NECK PAIN, ACUTE 09/21/2010   Neuromuscular disorder ([facility])    carpal tunnel in both hands   Nocturia    Osteoarthritis 112-17-12   spine and rt. hip, rt. shoulder   Patent foramen ovale    Small - unable to be closed -    Pneumonia    PONV (postoperative nausea and vomiting)    Sleep apnea 09-05-11   no cpap ever, had surgery to remove cartilage, no problems now   SORE THROAT 04/30/2009   Stroke ([place]) 09-05-11   2006/2009-(loss of memory, balance issues remains occ.)   TIA 09/28/2007, 10/2014   Unstable angina ([place])    a. 05/2010 Cath: nl cors, EF 55%;  b. 05/2015 Lexiscan MV: small, severe, fixed apical defect and a small, severe, reversible inf lateral defect w/ apical thinning and mild ischemia, EF  54%.   URI 09/02/2008   Vertigo    Past Surgical History:  Procedure Laterality Date   ABDOMINAL HYSTERECTOMY     BACK SURGERY     CARDIAC CATHETERIZATION N/A 06/01/2015   Procedure: Left Heart Cath and Coronary Angiography;  Surgeon: Burnell Blanks, MD;  Location: [facility];  Service: Cardiovascular;  Laterality: N/A;   COLON RESECTION  09/07/2011   Procedure: LAPAROSCOPIC SIGMOID COLON RESECTION;  Surgeon: Edward Jolly, MD;  Location: [facility];  Service: General;  Laterality: N/A;  with proctoscopy   COLONOSCOPY  08/06/2015   per Dr. Earlean Shawl, adenomatous polyps, repeat in 5 yrs    CYST EXCISION N/A 11/29/2016   Procedure: EXCISION UMBILICAL CYST;  Surgeon: Excell Seltzer, MD;  Location: [facility];  Service: General;  Laterality: N/A;   ELBOW SURGERY  09-05-11   left elbow -ligament repair   EYE SURGERY     cataract   INSERTION OF MESH N/A 12/13/2013   Procedure: INSERTION OF MESH;  Surgeon: Edward Jolly, MD;  Location: [facility];  Service: General;  Laterality: N/A;   LAPAROSCOPY  09/07/2011   Procedure: LAPAROSCOPY DIAGNOSTIC;  Surgeon: Shon Millet II;  Location: [facility];  Service: Gynecology;  Laterality: N/A;   MAXIMUM ACCESS (MAS)POSTERIOR LUMBAR INTERBODY FUSION (PLIF)  2 LEVEL N/A 05/03/2016   Procedure: L4-5 L5-S1 Maximum access posterior lumbar interbody fusion;  Surgeon: Erline Levine, MD;  Location: [facility];  Service: Neurosurgery;  Laterality: N/A;  L4-5 L5-S1 Maximum access posterior lumbar interbody fusion   SALPINGOOPHORECTOMY  09/07/2011   Procedure: SALPINGO OOPHERECTOMY;  Surgeon: Shon Millet II;  Location: [facility];  Service: Gynecology;  Laterality: Right;   THYROIDECTOMY, PARTIAL  09-05-11   TOTAL HIP ARTHROPLASTY Right 08/21/2018   Procedure: TOTAL HIP ARTHROPLASTY ANTERIOR APPROACH;  Surgeon: Melrose Nakayama, MD;  Location: [facility];  Service: Orthopedics;  Laterality: Right;   TUBAL LIGATION  [id number]   UMBILICAL HERNIA REPAIR  yrs ago    VENTRAL HERNIA REPAIR  06/20/2012   Procedure: LAPAROSCOPIC VENTRAL HERNIA;  Surgeon: Edward Jolly, MD;  Location: [facility];  Service: General;  Laterality: N/A;  Laparoscopic Repair of Ventral Hernia with mesh   VENTRAL HERNIA REPAIR N/A 12/13/2013   Procedure: LAPAROSCOPIC REPAIR RECURRENT VENTRAL INCISIONAL  HERNIA;  Surgeon: Edward Jolly, MD;  Location: [facility];  Service: General;  Laterality: N/A;    Allergies  Allergies  Allergen Reactions   Dilaudid [Hydromorphone Hcl] Shortness Of Breath, Nausea And Vomiting and Other (See Comments)    Able to take morphine without issue   Shellfish Allergy Shortness Of Breath and Rash    Only shrimp allergy   Aggrenox [Aspirin-Dipyridamole Er] Nausea And Vomiting and Other (See Comments)    Headache    Codeine Nausea And Vomiting   Doxycycline Monohydrate Diarrhea   Cephalexin Itching and Rash   Codeine Phosphate Nausea And Vomiting   Hydrocodone Nausea And Vomiting and Other (See Comments)    Can take morphine without issue   Meperidine Hcl Nausea And Vomiting   Percocet [Oxycodone-Acetaminophen] Nausea And Vomiting and Other (See Comments)    Can take morphine without issue    History of Present Illness    Ms. Weghorst has a long history of palpitations and presyncope.  She was noted to have a PFO on TEE echocardiogram in 2006 following CVA.  An event monitor in 2011 for palpitations showed sinus with PACs.  And nuclear study 05/2015 showed apical thinning and mild ischemia in the inferior lateral wall.  A cardiac catheterization on 05/2015 showed nonobstructive coronary artery disease and normal LV function.  Her carotid Dopplers that were done on 6/18 showed 1-39 bilateral stenosis.  An echocardiogram 11/2017 showed normal LV function and mild diastolic dysfunction.  She had a recent acute DVT following hip surgery 2020.   She was last seen by Dr. Stanford Breed on 04/16/2019.  During that time she described palpitations associated with  dizziness that would last several minutes at a time.  She described the episodes as sudden in onset.  She did have some dyspnea on exertion but no chest pain at that time.   She contacted the triage nurse on 08/02/2019 with vertigo type symptoms.  Her blood pressures were high 130s to 150s over 80s with heart rates in the high 50s to low 70s.   She presented to the clinic 09/06/2019 and stated she had not had any dizziness or noticed any palpitations since she contacted the office 1 month prior.  She stated she had been very sedentary with the COVID-19 pandemic.  She stated that her diet was poor.  She did not eat very much and enjoyed things such as biscuits.  She indicated that for 2-1/2 hours that day she had chest discomfort on her left side  that radiated down her left arm to her elbow.  She stated this was mild in nature and went away with laying down/rest.  She stated that she had this type of chest discomfort every other month.  Her chest discomfort was nonexertional in nature and came on while she was watching television.  This chest discomfort was not reproducible in the clinic.  It did not appear to be cardiac in nature.  I started her on amlodipine 2.5, reevaluated her lipids, and encourage her to eat a low-sodium heart healthy diet.   She presented to the clinic 10/09/2019 and stated she has only had 2 episodes of brief chest discomfort in the past month.  She was pleased about how infrequent her chest discomfort was at the time.  She stated that she did have some increased work of breathing with more strenuous activities.  She did not notice any increased work of breathing with routine activities.  She had been trying to walk for 10 minutes a day however, she was limited due to her back pain.  She stated that she would follow up with her neurosurgeon but did not wish to undergo surgery again.  She also stated that she would like to lose about 20 to 25 pounds because she felt it would help with her  discomfort.  I encouraged her to limit her carbohydrates, and eat a heart healthy diet.  I gave her the salty 6 diet sheet  and planned her follow-up with Dr. Stanford Breed in 6 months.  She presented to the clinic 01/25/21 for follow-up evaluation stated she had been struggling with her weight.  She reported that she felt she was not eating enough to gain weight.  Her weight today was 202 pounds.  She reported that she was limited in her physical activity due to orthopedic issues.  She reported occasional episodes of  chest discomfort at rest.  She denied chest discomfort with increased physical activity.  She did note increased shortness of breath with increased physical activity.  I  refered her to the healthy weight and wellness clinic, continued her current medications, asked her increase her physical activity as tolerated, and planned follow-up in 9 months.  She was given the [place] support stocking sheet.  She presented to the clinic 08/18/21 for follow-up evaluation stated she had daily if not every other day episodes of chest discomfort.  Her episodes happen both with physical activity and at rest.  They lasted for around 5 to 10 minutes and dissipated without intervention.  They seemed to be coinciding with irregular heartbeats.  We reviewed her previous cardiac catheterization and previous cardiac event monitor.  She reported that she  avoided caffeine.   She has had surgery in the past for OSA.  She was limited in her physical activity due to her right hip and back pain.  She did not feel she hydrated well.  We reviewed triggers for palpitations.  We discussed repeating her cardiac event monitor and further evaluation with coronary CTA if her symptoms persist or worsen.  Her blood pressure was also  elevated at home in the 150s over 90s.  I increased her amlodipine, ordered a 14-day cardiac event monitor, repeated her fasting lipids and LFTs, gave her the salty 6 diet sheet, and planned follow-up in 6 to  8 weeks.  Her cardiac event monitor showed sinus bradycardia, normal sinus rhythm, sinus tachycardia, and brief PAT, PVC, and 9 beats of NSVT.  Her current medication regimen was continued.  She  presents to the clinic today for follow-up evaluation states she feels feeling fairly well today.  She reports that she is having fewer episodes of palpitations.  She is noticing them every 2 weeks as opposed to several times per week like she previously was.  We reviewed her cardiac event monitor.  She has been compliant with her increased amlodipine and reports that her blood pressures been much better controlled.  She does note that when she is receiving her medications from her pharmacy it is still listed as a 5 mg dose rather than 10.  We will resend her 10 mg prescription.  She reports that she is working with neurology and will have a follow-up brain MRI today.  There is some concern for TIA versus CVA.  She appears to be at her baseline neurologically today.  She reports that she is continuing to have some shortness of breath.  She attributes this to weight gain.  She is not able to be as physically active due to back and hip pain.  I will continue her current medication regimen, diet, have her increase her physical activity as tolerated (recommended nonweightbearing/aquatic type exercises).  We will plan follow-up for 4 to 6 months.   She denies, increased shortness of breath, increased lower extremity edema, fatigue, melena, hematuria, hemoptysis, diaphoresis, weakness, presyncope, syncope, orthopnea, and PND.   Home Medications    Prior to Admission medications   Medication Sig Start Date End Date Taking? Authorizing Provider  amLODipine (NORVASC) 2.5 MG tablet TAKE 1 TABLET BY MOUTH EVERY DAY 12/23/20   Lelon Perla, MD  atorvastatin (LIPITOR) 40 MG tablet TAKE 1 TABLET BY MOUTH EVERY DAY 01/10/20   Laurey Morale, MD  butalbital-acetaminophen-caffeine (FIORICET) [phone] MG tablet TAKE 1 TABLET  BY MOUTH EVERY 6 HOURS AS NEEDED FOR HEADACHE 12/24/20   Laurey Morale, MD  clopidogrel (PLAVIX) 75 MG tablet TAKE 1 TABLET BY MOUTH EVERY DAY 02/24/20   Laurey Morale, MD  fluticasone (FLONASE) 50 MCG/ACT nasal spray Place 1-2 sprays into both nostrils daily. 06/11/20   Wieters, Hallie C, PA-C  levothyroxine (SYNTHROID) 125 MCG tablet TAKE 1 TABLET (125 MCG TOTAL) BY MOUTH DAILY BEFORE BREAKFAST. 08/24/20   Laurey Morale, MD  losartan (COZAAR) 100 MG tablet Take 1 tablet (100 mg total) by mouth daily. 08/12/20   Lelon Perla, MD  metoprolol succinate (TOPROL-XL) 25 MG 24 hr tablet TAKE 1 TABLET BY MOUTH EVERY DAY. NEED APPT FOR REFILLS 12/23/20   Lelon Perla, MD  tetrahydrozoline 0.05 % ophthalmic solution Place 1 drop into both eyes 4 (four) times daily as needed (for dry/irritated eyes.).    [provider]  traMADol (ULTRAM) 50 MG tablet TAKE 1 TO 2 TABLETS BY MOUTH EVERY 4 TO 6 HOURS AS NEEDED FOR PAIN 08/06/20   Laurey Morale, MD  potassium chloride (KLOR-CON) 10 MEQ tablet TAKE 1 TABLET BY MOUTH EVERY DAY 05/26/20 06/11/20  Laurey Morale, MD  pregabalin (LYRICA) 75 MG capsule Take 1 capsule (75 mg total) by mouth at bedtime. 04/01/20 06/11/20  Laurey Morale, MD    Family History    Family History  Problem Relation Age of Onset   Arrhythmia Mother    Hypertension Mother    Stroke Mother    Stroke Maternal Grandmother    Diabetes Paternal Grandmother    Breast cancer Sister        71s   Breast cancer Sister  60s   She indicated that her mother is deceased. She indicated that her father is deceased. She indicated that her maternal grandmother is deceased. She indicated that her maternal grandfather is deceased. She indicated that her paternal grandmother is deceased. She indicated that her paternal grandfather is deceased.   Social History    Social History   Socioeconomic History   Marital status: Married    Spouse name: Not on file   Number of children:  Not on file   Years of education: Not on file   Highest education level: Not on file  Occupational History   Not on file  Tobacco Use   Smoking status: Former    Packs/day: 0.50    Years: 5.00    Pack years: 2.50    Types: Cigarettes    Quit date: 09/19/1968    Years since quitting: 53.1   Smokeless tobacco: Never  Vaping Use   Vaping Use: Never used  Substance and Sexual Activity   Alcohol use: Yes    Alcohol/week: 2.0 standard drinks    Types: 1 Cans of beer, 1 Shots of liquor per week    Comment: rarely   Drug use: No   Sexual activity: Yes  Other Topics Concern   Not on file  Social History Narrative   Lives in [place] with her husband.  She does not routinely exercise.   Social Determinants of Health   Financial Resource Strain: Not on file  Food Insecurity: Not on file  Transportation Needs: Not on file  Physical Activity: Not on file  Stress: Not on file  Social Connections: Not on file  Intimate Partner Violence: Not on file     Review of Systems    General:  No chills, fever, night sweats or weight changes.  Cardiovascular:  No chest pain, dyspnea on exertion, edema, orthopnea, palpitations, paroxysmal nocturnal dyspnea. Dermatological: No rash, lesions/masses Respiratory: No cough, dyspnea Urologic: No hematuria, dysuria Abdominal:   No nausea, vomiting, diarrhea, bright red blood per rectum, melena, or hematemesis Neurologic:  No visual changes, wkns, changes in mental status. All other systems reviewed and are otherwise negative except as noted above.  Physical Exam    VS:  BP 128/90 (BP Location: Left Arm, Patient Position: Sitting, Cuff Size: Large)    Pulse (!) 56    Ht 5' 5.5" (1.664 m)    Wt 204 lb 12.8 oz (92.9 kg)    LMP  (LMP Unknown)    SpO2 98%    BMI 33.56 kg/m  , BMI Body mass index is 33.56 kg/m. GEN: Well nourished, well developed, in no acute distress. HEENT: normal. Neck: Supple, no JVD, carotid bruits, or masses. Cardiac: RRR, no  murmurs, rubs, or gallops. No clubbing, cyanosis, edema.  Radials/DP/PT 2+ and equal bilaterally.  Respiratory:  Respirations regular and unlabored, clear to auscultation bilaterally. GI: Soft, nontender, nondistended, BS + x 4. MS: no deformity or atrophy. Skin: warm and dry, no rash. Neuro:  Strength and sensation are intact. Psych: Normal affect.  Accessory Clinical Findings    Recent Labs: 11/02/2020: BUN 9; Creatinine, Ser 0.71; Hemoglobin 14.2; Platelets 163; Potassium 3.5; Sodium 138 08/20/2021: ALT 13   Recent Lipid Panel    Component Value Date/Time   CHOL 184 09/29/2021 1007   TRIG 86 09/29/2021 1007   HDL 76 09/29/2021 1007   CHOLHDL 2.4 09/29/2021 1007   CHOLHDL 2.2 04/01/2020 1402   VLDL 17.6 05/15/2018 1528   LDLCALC 92 09/29/2021 1007  [place] 74 04/01/2020 1402    ECG personally reviewed by me today-none today.  EKG 08/18/2021 sinus bradycardia ST and T wave abnormality consider inferior ischemia 54 bpm no acute changes.  EKG 09/06/2019 sinus bradycardia 52 bpm- No acute changes   EKG 04/16/2019 Sinus bradycardia LVH 58 bpm   Echocardiogram 12/07/2017 Study Conclusions   - Left ventricle: The cavity size was normal. There was mild focal   basal hypertrophy of the septum. Systolic function was vigorous.   The estimated ejection fraction was in the range of 65% to 70%.   Wall motion was normal; there were no regional wall motion   abnormalities. Doppler parameters are consistent with abnormal   left ventricular relaxation (grade 1 diastolic dysfunction). - Aortic valve: Trileaflet; mildly thickened, mildly calcified   leaflets.   Cardiac event monitor 06/10/2019 Sinus bradycardia, normal sinus rhythm, PVCs, 4 and 5 beats of nonsustained ventricular tachycardia.   Cardiac catheterization 06/01/2015 Prox RCA lesion, 10% stenosed. Prox Cx lesion, 10% stenosed. Prox LAD to Dist LAD lesion, 10% stenosed. The left ventricular systolic function is normal.    1. Mild non-obstructive CAD 2. Normal LV systolic function 3. Non-cardiac chest pain   Recommendations: Medical management of mild CAD.   Cardiac event monitor 09/09/2021 Patch Wear Time:  13 days and 0 hours (2022-12-04T07:53:07-499 to 2022-12-17T08:20:10-498)   Patient had a min HR of 40 bpm, max HR of 139 bpm, and avg HR of 58 bpm. Predominant underlying rhythm was Sinus Rhythm. 1 run of Ventricular Tachycardia occurred lasting 9 beats with a max rate of 139 bpm (avg 117 bpm). 2 Supraventricular Tachycardia  runs occurred, the run with the fastest interval lasting 19 beats with a max rate of 129 bpm (avg 113 bpm); the run with the fastest interval was also the longest. Isolated SVEs were rare (<1.0%), SVE Couplets were rare (<1.0%), and SVE Triplets were  rare (<1.0%). Isolated VEs were rare (<1.0%), VE Couplets were rare (<1.0%), and no VE Triplets were present. Ventricular Bigeminy and Trigeminy were present.    Sinus bradycardia, NSR, sinus tachycardia, pacs, brief PAT, rare PVC, 9 beats NSVT Kirk Ruths  Assessment & Plan   1. Palpitations-notices irregular heartbeat about every 2 weeks.  Much less frequent.   Cardiac event monitor 09/09/2021 showed bradycardia, NSR, sinus tachycardia, PACs, brief PAT, and PVCs.  Details above    cardiac event monitor 05/2019 sinus bradycardia, normal sinus rhythm, PVCs, 4 and 5 beats of nonsustained VT.  Patient started onMetoprolol succinate 25 mg daily 06/25/2019. Continue metoprolol succinate 25 mg tablet daily Avoid triggers-caffeine, chocolate, EtOH, dehydration etc. Continue to monitor   Essential hypertension-BP today 128/90.  Has had elevated blood pressure at home in the 150s over 90s.  Will contact pharmacy to clarify prescription dosing. Continue losartan, metoprolol,  Continue amlodipine  Heart healthy low-sodium diet-salty 6 reviewed Increase physical activity as tolerated Maintain blood pressure log. Continue weight loss    Hyperlipidemia-09/29/2021: Cholesterol, Total 184; HDL 76; LDL Chol Calc (NIH) 92; Triglycerides 86 Continue  atorvastatin  Heart healthy high-fiber low-sodium diet Increase physical activity as tolerated  Chest discomfort-improved with increased amlodipine.  Continues to report occasional episodes of chest pressure and tightness.     Cardiac catheterization 05/2015 showed mild nonobstructive coronary artery disease, normal LV function and CP was believed to be noncardiac.      Continue amlodipine to 5 mg daily Increase physical activity Heart healthy low-sodium diet-salty 6 reviewed   History of cerebrovascular accident- Event in 2006.  Neurology feels that she may have had another neurologic event TIA/CVA.  She will have a brain MRI today. Continue clopidogrel 75 mg tablet daily Follows with neurology  Obesity-weight today 204.8 pounds.  Stable at this time.    Reports she has tried to lose weight with dieting. Continue weight loss/portion control     Disposition: Follow-up with Dr. Stanford Breed or me in 4-6 months.   Jossie Ng. Antione Obar NP-C    10/21/2021, 12:00 PM [facility] [address] Office [phone] Fax [phone]  Notice: This dictation was prepared with Dragon dictation along with smaller phrase technology. Any transcriptional errors that result from this process are unintentional and may not be corrected upon review.  I spent 14 minutes examining this patient, reviewing medications, and using patient centered shared decision making involving her cardiac care.  Prior to her visit I spent greater than 20 minutes reviewing her past medical history,  medications, and prior cardiac tests.

## 2021-10-21 ENCOUNTER — Other Ambulatory Visit: Payer: Self-pay

## 2021-10-21 ENCOUNTER — Ambulatory Visit
Admission: RE | Admit: 2021-10-21 | Discharge: 2021-10-21 | Disposition: A | Discharge status: Home / Self Care | Payer: Medicare PPO | Source: Ambulatory Visit | Attending: Neurology | Admitting: Neurology

## 2021-10-21 ENCOUNTER — Encounter: Payer: Self-pay | Admitting: General Practice

## 2021-10-21 ENCOUNTER — Ambulatory Visit: Payer: Medicare PPO | Admitting: General Practice

## 2021-10-21 VITALS — BP 128/90 | HR 56 | Ht 65.5 in | Wt 204.8 lb

## 2021-10-21 DIAGNOSIS — Z8673 Personal history of transient ischemic attack (TIA), and cerebral infarction without residual deficits: Secondary | ICD-10-CM

## 2021-10-21 DIAGNOSIS — R2681 Unsteadiness on feet: Secondary | ICD-10-CM

## 2021-10-21 DIAGNOSIS — I10 Essential (primary) hypertension: Secondary | ICD-10-CM

## 2021-10-21 DIAGNOSIS — I639 Cerebral infarction, unspecified: Secondary | ICD-10-CM | POA: Diagnosis not present

## 2021-10-21 DIAGNOSIS — E7800 Pure hypercholesterolemia, unspecified: Secondary | ICD-10-CM

## 2021-10-21 DIAGNOSIS — R002 Palpitations: Secondary | ICD-10-CM

## 2021-10-21 DIAGNOSIS — Z6833 Body mass index (BMI) 33.0-33.9, adult: Secondary | ICD-10-CM

## 2021-10-21 DIAGNOSIS — R072 Precordial pain: Secondary | ICD-10-CM | POA: Diagnosis not present

## 2021-10-21 MED ORDER — AMLODIPINE BESYLATE 10 MG PO TABS: 10.0000 mg | ORAL_TABLET | Freq: Every day | ORAL | 9 refills | Status: DC

## 2021-10-21 NOTE — Patient Instructions (Signed)
Medication Instructions:  TAKE AMLODIPINE 10MG  DAILY  *If you need a refill on your cardiac medications before your next appointment, please call your pharmacy*  Lab Work:   Testing/Procedures:  NONE    NONE  Special Instructions PLEASE INCREASE PHYSICAL ACTIVITY AS TOLERATED-DO NON-WEIGHT BEARING EXERCISES LIKE WATER AEROBICS    Follow-Up: Your next appointment:  4-6 month(s) In Person with , MD :1  At [facility], you and your health needs are our priority.  As part of our continuing mission to provide you with exceptional heart care, we have created designated Provider Care Teams.  These Care Teams include your primary Cardiologist (physician) and Advanced Practice Providers (APPs -  Physician Assistants and Nurse Practitioners) who all work together to provide you with the care you need, when you need it.

## 2021-10-25 ENCOUNTER — Ambulatory Visit: Payer: Medicare PPO

## 2021-10-28 ENCOUNTER — Telehealth: Payer: Self-pay

## 2021-10-28 ENCOUNTER — Ambulatory Visit: Payer: Medicare PPO

## 2021-10-28 NOTE — Telephone Encounter (Signed)
Contacted patient for scheduled AWV. Patient stated would like to reschedule due to appointment time misunderstanding.

## 2021-11-01 ENCOUNTER — Telehealth: Payer: Self-pay | Admitting: Family Medicine

## 2021-11-01 NOTE — Telephone Encounter (Signed)
Left message for patient to call back and schedule Medicare Annual Wellness Visit (AWV) either virtually or in office. Left  my jabber number [phone]  AWV-I PER [place] AS OF 09/19/09 please schedule at anytime with [facility] Nurse Health Advisor 1 or 2   This should be a 45 minute visit.  

## 2021-11-14 ENCOUNTER — Other Ambulatory Visit: Payer: Self-pay | Admitting: Family Medicine

## 2021-11-14 DIAGNOSIS — E039 Hypothyroidism, unspecified: Secondary | ICD-10-CM

## 2021-11-15 ENCOUNTER — Other Ambulatory Visit: Payer: Self-pay | Admitting: Family Medicine

## 2021-11-15 ENCOUNTER — Telehealth: Payer: Self-pay | Admitting: Family Medicine

## 2021-11-15 NOTE — Telephone Encounter (Signed)
Noted  

## 2021-11-15 NOTE — Telephone Encounter (Addendum)
Pt is calling checking on the paperwork she dropped off over a week ago concerning tramadol

## 2021-11-15 NOTE — Telephone Encounter (Signed)
I think this was about a PA, and Izora Gala should be working on it

## 2021-11-15 NOTE — Telephone Encounter (Signed)
Please advise 

## 2021-11-16 NOTE — Telephone Encounter (Signed)
Last refill- 06/16/21---60 tabs, 5 refills Last OV- 07/29/21  No future OV scheduled

## 2021-11-17 ENCOUNTER — Other Ambulatory Visit (INDEPENDENT_AMBULATORY_CARE_PROVIDER_SITE_OTHER): Payer: Medicare PPO

## 2021-11-17 DIAGNOSIS — E039 Hypothyroidism, unspecified: Secondary | ICD-10-CM | POA: Diagnosis not present

## 2021-11-17 LAB — T4, FREE: Free T4: 1.14 ng/dL (ref 0.60–1.60)

## 2021-11-17 LAB — T3, FREE: T3, Free: 2.9 pg/mL (ref 2.3–4.2)

## 2021-11-17 LAB — TSH: TSH: 2.27 u[IU]/mL (ref 0.35–5.50)

## 2021-11-17 NOTE — Telephone Encounter (Signed)
Message routed to PCP CMA  

## 2021-11-17 NOTE — Telephone Encounter (Signed)
Pt PA appeal for Tramadol 50 mg tablets was sent to plan, pt will be notified when the office receives an outcome from pt plan ?

## 2021-11-17 NOTE — Telephone Encounter (Signed)
Pt is here for labs, she is inquiring about her PA for Tramadol.  She states she really needs a call back from [place]. ?

## 2021-11-19 NOTE — Telephone Encounter (Signed)
Pt PA was approved and pt is aware ?

## 2021-11-25 ENCOUNTER — Encounter: Payer: Medicare PPO | Admitting: Diagnostic Neuroimaging

## 2021-11-25 ENCOUNTER — Other Ambulatory Visit: Payer: Self-pay

## 2021-11-25 ENCOUNTER — Ambulatory Visit (INDEPENDENT_AMBULATORY_CARE_PROVIDER_SITE_OTHER): Payer: Medicare PPO | Admitting: Diagnostic Neuroimaging

## 2021-11-25 DIAGNOSIS — R2681 Unsteadiness on feet: Secondary | ICD-10-CM

## 2021-11-25 DIAGNOSIS — Z0289 Encounter for other administrative examinations: Secondary | ICD-10-CM

## 2021-11-25 NOTE — Procedures (Signed)
? ?[facility] ? ?NCS (NERVE CONDUCTION STUDY) WITH EMG (ELECTROMYOGRAPHY) REPORT ? ? ?STUDY DATE: 11/25/21 ?PATIENT NAME: Abigail [name] ?DOB: [birth year] ?MRN: [id number] ? ?ORDERING CLINICIAN: Antony Contras, MD  ? ?TECHNOLOGIST: Sherre Scarlet ?ELECTROMYOGRAPHER: Jovi Alvizo R. Blessings Inglett, MD ? ?CLINICAL INFORMATION: 76 year old female with unsteady gait.  ? ?FINDINGS: ?NERVE CONDUCTION STUDY: ? ?Bilateral tibial and peroneal motor responses are normal. ? ?Bilateral sural and superficial peroneal sensory responses are normal. ? ?Bilateral tibial F wave latencies are normal. ? ? ? ?NEEDLE ELECTROMYOGRAPHY: ? ?Needle examination of right lower extremity vastus medialis, tibialis anterior, gastrocnemius is normal. ? ? ? ?IMPRESSION:  ? ?Normal study.  No electrodiagnostic evidence of large fiber neuropathy at this time. ? ? ? ?INTERPRETING PHYSICIAN:  ?Penni Bombard, MD ?Certified in Neurology, Neurophysiology and Neuroimaging ? ?[facility] ?[address], [address] ?[place],  [address] ?[phone] ? ? ?[place] ?   ?Nerve / Sites Muscle Latency Ref. Amplitude Ref. Rel Amp Segments Distance Velocity Ref. Area  ?  ms ms mV mV %  cm m/s m/s mVms  ?R Peroneal - EDB  ?   Ankle EDB 4.7 ?6.5 4.5 ?2.0 100 Ankle - EDB 9   15.3  ?   Fib head EDB 11.5  3.6  79.5 Fib head - Ankle 30 44 ?44 14.9  ?   Pop fossa EDB 13.8  3.4  94.1 Pop fossa - Fib head 10 44 ?44 12.9  ?       Pop fossa - Ankle      ?L Peroneal - EDB  ?   Ankle EDB 4.8 ?6.5 6.0 ?2.0 100 Ankle - EDB 9   19.5  ?   Fib head EDB 11.5  5.2  87 Fib head - Ankle 30 44 ?44 18.5  ?   Pop fossa EDB 13.8  4.8  90.9 Pop fossa - Fib head 10 44 ?44 16.7  ?       Pop fossa - Ankle      ?R Tibial - AH  ?   Ankle AH 4.9 ?5.8 11.0 ?4.0 100 Ankle - AH 9   25.3  ?   Pop fossa AH 12.7  8.8  80.4 Pop fossa - Ankle 40 51 ?41 24.9  ?L Tibial - AH  ?   Ankle AH 4.4 ?5.8 8.0 ?4.0 100 Ankle - AH 9   23.7  ?   Pop fossa AH 13.5  6.8  85.4 Pop fossa - Ankle 40 44 ?41  23.3  ?           ?[place] ?   ?Nerve / Sites Rec. Site Peak Lat Ref.  Amp Ref. Segments Distance  ?  ms ms ?V ?V  cm  ?R Sural - Ankle (Calf)  ?   Calf Ankle 2.6 ?4.4 6 ?6 Calf - Ankle 14  ?L Sural - Ankle (Calf)  ?   Calf Ankle 2.3 ?4.4 6 ?6 Calf - Ankle 14  ?R Superficial peroneal - Ankle  ?   Lat leg Ankle 3.8 ?4.4 6 ?6 Lat leg - Ankle 14  ?L Superficial peroneal - Ankle  ?   Lat leg Ankle 3.8 ?4.4 10 ?6 Lat leg - Ankle 14  ?           ?F  Wave ?   ?Nerve F Lat Ref.  ? ms ms  ?R Tibial - AH 51.7 ?56.0  ?L Tibial - AH 53.8 ?56.0  ?       ?  EMG Summary Table   ? Spontaneous MUAP Recruitment  ?Muscle IA Fib PSW Fasc Other Amp Dur. Poly Pattern  ?R. Vastus medialis Normal None None None _______ Normal Normal Normal Normal  ?R. Tibialis anterior Normal None None None _______ Normal Normal Normal Normal  ?R. Gastrocnemius (Medial head) Normal None None None _______ Normal Normal Normal Normal  ? ?  ? ?

## 2021-12-09 ENCOUNTER — Telehealth: Payer: Self-pay | Admitting: Family Medicine

## 2021-12-09 NOTE — Telephone Encounter (Signed)
Left message for patient to call back and schedule Medicare Annual Wellness Visit (AWV) either virtually or in office. Left  my Abigail [name] number [phone] ? ? ?AWV-I PER [place] AS OF 09/19/09 ?please schedule at anytime with [facility] Nurse Health Advisor 1 or 2 ? ? ? ?

## 2021-12-15 DIAGNOSIS — N760 Acute vaginitis: Secondary | ICD-10-CM | POA: Diagnosis not present

## 2021-12-19 ENCOUNTER — Other Ambulatory Visit: Payer: Self-pay | Admitting: Family Medicine

## 2021-12-21 ENCOUNTER — Telehealth (INDEPENDENT_AMBULATORY_CARE_PROVIDER_SITE_OTHER): Payer: Medicare PPO | Admitting: Family Medicine

## 2021-12-21 ENCOUNTER — Encounter: Payer: Self-pay | Admitting: Family Medicine

## 2021-12-21 DIAGNOSIS — J029 Acute pharyngitis, unspecified: Secondary | ICD-10-CM | POA: Diagnosis not present

## 2021-12-21 MED ORDER — LEVOFLOXACIN 500 MG PO TABS: 500.0000 mg | ORAL_TABLET | Freq: Every day | ORAL | 0 refills | Status: AC

## 2021-12-21 MED ORDER — BENZONATATE 200 MG PO CAPS: 200.0000 mg | ORAL_CAPSULE | Freq: Four times a day (QID) | ORAL | 0 refills | Status: DC | PRN

## 2021-12-21 MED ORDER — METHYLPREDNISOLONE 4 MG PO TBPK: ORAL_TABLET | ORAL | 0 refills | Status: DC

## 2021-12-21 NOTE — Progress Notes (Signed)
? ?Subjective:  ? ? Patient ID: Abigail [name], female    DOB: November 06, 1945, 76 y.o.   MRN: [id number] ? ?HPI ?Virtual Visit via Video Note ? ?I connected with the patient on 12/21/21 at  2:00 PM EDT by a video enabled telemedicine application and verified that I am speaking with the correct person using two identifiers. ? Location patient: home ?Location provider:work or home office ?Persons participating in the virtual visit: patient, provider ? ?I discussed the limitations of evaluation and management by telemedicine and the availability of in person appointments. The patient expressed understanding and agreed to proceed. ? ? ?HPI: ?Here for 3 days of a bad ST, PND, and a dry cough. No fever or chest pain or SOB. Drinking fluids. She also feels like the lymph nodes in her neck are swollen.  ? ? ?ROS: See pertinent positives and negatives per HPI. ? ?Past Medical History:  ?Diagnosis Date  ? Bronchitis 09/2016  ? Bruises easily   ? DDD (degenerative disc disease)   ? Degenerative joint disease of cervical spine 09-05-11  ? Cervival area, now some osteoarthritis-lower back and Rt. shoulder  ? Depression   ? Diverticulitis 09-05-11  ? hx. gastritis, diverticulitis x2 -now surgery planned  ? DIVERTICULITIS, ACUTE 02/10/2010  ? Dyspnea   ? with exertion  ? Essential hypertension 09-05-11  ? tx. Verapamil  ? GROIN PAIN 09/21/2010  ? Headache(784.0)   ? headaches are better  ? Hearing loss   ? right ear  ? Heart palpitations   ? Hemorrhoids   ? History of bronchitis   ? History of colonic polyps   ? History of pneumonia   ? History of tension headache   ? Hyperlipidemia   ? Hypertension   ? Hypothyroidism 09-05-11  ? Supplement used  ? Late effect of adverse effect of drug, medicinal or biological substance   ? NECK PAIN, ACUTE 09/21/2010  ? Neuromuscular disorder (HCC)   ? carpal tunnel in both hands  ? Nocturia   ? Osteoarthritis 112-17-12  ? spine and rt. hip, rt. shoulder  ? Patent foramen ovale   ? Small - unable to be closed  -   ? Pneumonia   ? PONV (postoperative nausea and vomiting)   ? Sleep apnea 09-05-11  ? no cpap ever, had surgery to remove cartilage, no problems now  ? SORE THROAT 04/30/2009  ? Stroke [facility]) 09-05-11  ? 2006/2009-(loss of memory, balance issues remains occ.)  ? TIA 09/28/2007, 10/2014  ? Unstable angina (HCC)   ? a. 05/2010 Cath: nl cors, EF 55%;  b. 05/2015 Lexiscan MV: small, severe, fixed apical defect and a small, severe, reversible inf lateral defect w/ apical thinning and mild ischemia, EF 54%.  ? URI 09/02/2008  ? Vertigo   ? ? ?Past Surgical History:  ?Procedure Laterality Date  ? ABDOMINAL HYSTERECTOMY    ? BACK SURGERY    ? CARDIAC CATHETERIZATION N/A 06/01/2015  ? Procedure: Left Heart Cath and Coronary Angiography;  Surgeon: Kathleene Hazel, MD;  Location: [facility] INVASIVE CV LAB;  Service: Cardiovascular;  Laterality: N/A;  ? COLON RESECTION  09/07/2011  ? Procedure: LAPAROSCOPIC SIGMOID COLON RESECTION;  Surgeon: Mariella Saa, MD;  Location: [facility] ORS;  Service: General;  Laterality: N/A;  with proctoscopy  ? COLONOSCOPY  08/06/2015  ? per Dr. Kinnie Scales, adenomatous polyps, repeat in 5 yrs   ? CYST EXCISION N/A 11/29/2016  ? Procedure: EXCISION UMBILICAL CYST;  Surgeon: Glenna Fellows, MD;  Location:  [facility] ORS;  Service: General;  Laterality: N/A;  ? ELBOW SURGERY  09-05-11  ? left elbow -ligament repair  ? EYE SURGERY    ? cataract  ? INSERTION OF MESH N/A 12/13/2013  ? Procedure: INSERTION OF MESH;  Surgeon: Mariella SaaBenjamin T Hoxworth, MD;  Location: [facility];  Service: General;  Laterality: N/A;  ? LAPAROSCOPY  09/07/2011  ? Procedure: LAPAROSCOPY DIAGNOSTIC;  Surgeon: Roselle LocusJames E Tomblin II;  Location: [facility];  Service: Gynecology;  Laterality: N/A;  ? MAXIMUM ACCESS (MAS)POSTERIOR LUMBAR INTERBODY FUSION (PLIF) 2 LEVEL N/A 05/03/2016  ? Procedure: L4-5 L5-S1 Maximum access posterior lumbar interbody fusion;  Surgeon: Maeola HarmanJoseph Stern, MD;  Location: [facility] NEURO ORS;  Service: Neurosurgery;  Laterality: N/A;  L4-5 L5-S1  Maximum access posterior lumbar interbody fusion  ? SALPINGOOPHORECTOMY  09/07/2011  ? Procedure: SALPINGO OOPHERECTOMY;  Surgeon: Roselle LocusJames E Tomblin II;  Location: [facility];  Service: Gynecology;  Laterality: Right;  ? THYROIDECTOMY, PARTIAL  09-05-11  ? TOTAL HIP ARTHROPLASTY Right 08/21/2018  ? Procedure: TOTAL HIP ARTHROPLASTY ANTERIOR APPROACH;  Surgeon: Marcene Corningalldorf, Peter, MD;  Location: [facility] OR;  Service: Orthopedics;  Laterality: Right;  ? TUBAL LIGATION  1983  ? UMBILICAL HERNIA REPAIR  yrs ago  ? VENTRAL HERNIA REPAIR  06/20/2012  ? Procedure: LAPAROSCOPIC VENTRAL HERNIA;  Surgeon: Mariella SaaBenjamin T Hoxworth, MD;  Location: [facility];  Service: General;  Laterality: N/A;  Laparoscopic Repair of Ventral Hernia with mesh  ? VENTRAL HERNIA REPAIR N/A 12/13/2013  ? Procedure: LAPAROSCOPIC REPAIR RECURRENT VENTRAL INCISIONAL  HERNIA;  Surgeon: Mariella SaaBenjamin T Hoxworth, MD;  Location: [facility];  Service: General;  Laterality: N/A;  ? ? ?Family History  ?Problem Relation Age of Onset  ? Arrhythmia Mother   ? Hypertension Mother   ? Stroke Mother   ? Stroke Maternal Grandmother   ? Diabetes Paternal Grandmother   ? Breast cancer Sister   ?     5150s  ? Breast cancer Sister   ?     4660s  ? ? ? ?Current Outpatient Medications:  ?  amLODipine (NORVASC) 10 MG tablet, Take 1 tablet (10 mg total) by mouth daily., Disp: 30 tablet, Rfl: 9 ?  atorvastatin (LIPITOR) 40 MG tablet, Take 1 tablet (40 mg total) by mouth daily., Disp: 90 tablet, Rfl: 1 ?  butalbital-acetaminophen-caffeine (FIORICET) 50-325-40 MG tablet, TAKE 1 TABLET BY MOUTH EVERY 6 HOURS AS NEEDED FOR HEADACHE, Disp: 60 tablet, Rfl: 5 ?  clopidogrel (PLAVIX) 75 MG tablet, TAKE 1 TABLET BY MOUTH EVERY DAY, Disp: 90 tablet, Rfl: 1 ?  fluticasone (FLONASE) 50 MCG/ACT nasal spray, Place 1-2 sprays into both nostrils daily., Disp: 16 g, Rfl: 0 ?  furosemide (LASIX) 20 MG tablet, Take 1 tablet (20 mg total) by mouth daily. (Patient taking differently: Take 20 mg by mouth daily as needed.), Disp: 30  tablet, Rfl: 5 ?  levothyroxine (SYNTHROID) 125 MCG tablet, TAKE 1 TABLET BY MOUTH DAILY BEFORE BREAKFAST., Disp: 90 tablet, Rfl: 1 ?  losartan (COZAAR) 100 MG tablet, TAKE 1 TABLET BY MOUTH EVERY DAY, Disp: 90 tablet, Rfl: 3 ?  metoprolol succinate (TOPROL-XL) 25 MG 24 hr tablet, TAKE 1 TABLET BY MOUTH EVERY DAY. NEED APPT FOR REFILLS, Disp: 30 tablet, Rfl: 2 ?  tetrahydrozoline 0.05 % ophthalmic solution, Place 1 drop into both eyes 4 (four) times daily as needed (for dry/irritated eyes.)., Disp: , Rfl:  ?  traMADol (ULTRAM) 50 MG tablet, TAKE 1 TO 2 TABLETS BY MOUTH EVERY 4 TO 6 HOURS  AS NEEDED FOR PAIN, Disp: 120 tablet, Rfl: 5 ? ?EXAM: ? ?VITALS per patient if applicable: ? ?GENERAL: alert, oriented, appears well and in no acute distress ? ?HEENT: atraumatic, conjunttiva clear, no obvious abnormalities on inspection of external nose and ears ? ?NECK: normal movements of the head and neck ? ?LUNGS: on inspection no signs of respiratory distress, breathing rate appears normal, no obvious gross SOB, gasping or wheezing ? ?CV: no obvious cyanosis ? ?MS: moves all visible extremities without noticeable abnormality ? ?PSYCH/NEURO: pleasant and cooperative, no obvious depression or anxiety, speech and thought processing grossly intact ? ?ASSESSMENT AND PLAN: ?Pharyngitis, likely due to strep. Treat with 10 days of Levaquin. Use Benzonatate for the cough, add a Medrol dose pack for discomfort.  ?Gershon Crane, MD ? ?Discussed the following assessment and plan: ? ?No diagnosis found. ? ? ?  ?I discussed the assessment and treatment plan with the patient. The patient was provided an opportunity to ask questions and all were answered. The patient agreed with the plan and demonstrated an understanding of the instructions. ?  ?The patient was advised to call back or seek an in-person evaluation if the symptoms worsen or if the condition fails to improve as anticipated. ? ?  ? ? ?Review of Systems ? ?   ?Objective:  ? Physical  Exam ? ? ? ? ?   ?Assessment & Plan:  ? ? ?

## 2021-12-23 ENCOUNTER — Other Ambulatory Visit: Payer: Self-pay | Admitting: Cardiology

## 2022-01-04 ENCOUNTER — Telehealth: Payer: Self-pay | Admitting: Family Medicine

## 2022-01-04 NOTE — Telephone Encounter (Signed)
Left message for patient to call back and schedule Medicare Annual Wellness Visit (AWV) either virtually or in office. Left  my jabber number [phone]  AWV-I PER [place] AS OF 09/19/09 please schedule at anytime with [facility] Nurse Health Advisor 1 or 2   This should be a 45 minute visit.  

## 2022-01-12 ENCOUNTER — Telehealth (INDEPENDENT_AMBULATORY_CARE_PROVIDER_SITE_OTHER): Payer: Medicare PPO | Admitting: Family Medicine

## 2022-01-12 ENCOUNTER — Encounter: Payer: Self-pay | Admitting: Family Medicine

## 2022-01-12 DIAGNOSIS — J069 Acute upper respiratory infection, unspecified: Secondary | ICD-10-CM | POA: Diagnosis not present

## 2022-01-12 DIAGNOSIS — M19011 Primary osteoarthritis, right shoulder: Secondary | ICD-10-CM | POA: Diagnosis not present

## 2022-01-12 DIAGNOSIS — M25512 Pain in left shoulder: Secondary | ICD-10-CM | POA: Diagnosis not present

## 2022-01-12 DIAGNOSIS — M1812 Unilateral primary osteoarthritis of first carpometacarpal joint, left hand: Secondary | ICD-10-CM | POA: Diagnosis not present

## 2022-01-12 DIAGNOSIS — M1811 Unilateral primary osteoarthritis of first carpometacarpal joint, right hand: Secondary | ICD-10-CM | POA: Diagnosis not present

## 2022-01-12 MED ORDER — AZITHROMYCIN 250 MG PO TABS: ORAL_TABLET | ORAL | 0 refills | Status: DC

## 2022-01-12 NOTE — Progress Notes (Signed)
? ?  Subjective:  ? ? Patient ID: Abigail [name], female    DOB: 1946/09/04, 76 y.o.   MRN: [id number] ? ?HPI ?Virtual Visit via Telephone Note ? ?I connected with the patient on 01/12/22 at  3:00 PM EDT by telephone and verified that I am speaking with the correct person using two identifiers. ?  ?I discussed the limitations, risks, security and privacy concerns of performing an evaluation and management service by telephone and the availability of in person appointments. I also discussed with the patient that there may be a patient responsible charge related to this service. The patient expressed understanding and agreed to proceed. ? ?Location patient: home ?Location provider: work or home office ?Participants present for the call: patient, provider ?Patient did not have a visit in the prior 7 days to address this/these issue(s). ? ? ?History of Present Illness: ?Here for recurrent symptoms of head congestion, PND, and coughing up yellow sputum. No fever. We saw her on 12-21-21 and gave her Levaquin and Prednisone. She felt better for about a week, but now the symptoms have returned. Her husband now has similar symptoms. Taking Coricidin.  ?  ?Observations/Objective: ?Patient sounds cheerful and well on the phone. ?I do not appreciate any SOB. ?Speech and thought processing are grossly intact. ?Patient reported vitals: ? ?Assessment and Plan: ?Partially treated URI, we will give her a Zpack. Recheck as needed.  ?Alysia Penna, MD ? ? ?Follow Up Instructions: ? ? ? ? ?[id number] 5-10 ?[id number] 11-20 ?9443 21-30 ?I did not refer this patient for an OV in the next 24 hours for this/these issue(s). ? ?I discussed the assessment and treatment plan with the patient. The patient was provided an opportunity to ask questions and all were answered. The patient agreed with the plan and demonstrated an understanding of the instructions. ?  ?The patient was advised to call back or seek an in-person evaluation if the symptoms worsen or if the  condition fails to improve as anticipated. ? ?I provided 13 minutes of non-face-to-face time during this encounter. ? ? ?Alysia Penna, MD   ? ? ?Review of Systems ? ?   ?Objective:  ? Physical Exam ? ? ? ? ?   ?Assessment & Plan:  ? ? ?

## 2022-01-14 ENCOUNTER — Telehealth (INDEPENDENT_AMBULATORY_CARE_PROVIDER_SITE_OTHER): Payer: Medicare PPO | Admitting: Adult Health

## 2022-01-14 ENCOUNTER — Encounter: Payer: Self-pay | Admitting: Adult Health

## 2022-01-14 ENCOUNTER — Ambulatory Visit: Payer: Medicare PPO | Admitting: Adult Health

## 2022-01-14 VITALS — BP 140/86 | HR 73 | Temp 99.2°F

## 2022-01-14 DIAGNOSIS — U071 COVID-19: Secondary | ICD-10-CM | POA: Diagnosis not present

## 2022-01-14 DIAGNOSIS — R059 Cough, unspecified: Secondary | ICD-10-CM | POA: Diagnosis not present

## 2022-01-14 DIAGNOSIS — R509 Fever, unspecified: Secondary | ICD-10-CM

## 2022-01-14 LAB — POCT RAPID STREP A (OFFICE): Rapid Strep A Screen: NEGATIVE

## 2022-01-14 LAB — POC COVID19 BINAXNOW: SARS Coronavirus 2 Ag: POSITIVE — AB

## 2022-01-14 MED ORDER — BENZONATATE 200 MG PO CAPS: 200.0000 mg | ORAL_CAPSULE | Freq: Four times a day (QID) | ORAL | 0 refills | Status: DC | PRN

## 2022-01-14 MED ORDER — MOLNUPIRAVIR EUA 200MG CAPSULE: 4.0000 | ORAL_CAPSULE | Freq: Two times a day (BID) | ORAL | 0 refills | Status: AC

## 2022-01-14 MED ORDER — ALBUTEROL SULFATE HFA 108 (90 BASE) MCG/ACT IN AERS: 2.0000 | INHALATION_SPRAY | Freq: Four times a day (QID) | RESPIRATORY_TRACT | 0 refills | Status: DC | PRN

## 2022-01-14 NOTE — Progress Notes (Signed)
? ?Subjective:  ? ? Patient ID: Abigail [name], female    DOB: Feb 05, 1946, 76 y.o.   MRN: [id number] ? ?HPI ?76 year old female who  has a past medical history of Bronchitis (09/2016), Bruises easily, DDD (degenerative disc disease), Degenerative joint disease of cervical spine (09-05-11), Depression, Diverticulitis (09-05-11), DIVERTICULITIS, ACUTE (02/10/2010), Dyspnea, Essential hypertension (09-05-11), GROIN PAIN (09/21/2010), Headache(784.0), Hearing loss, Heart palpitations, Hemorrhoids, History of bronchitis, History of colonic polyps, History of pneumonia, History of tension headache, Hyperlipidemia, Hypertension, Hypothyroidism (09-05-11), Late effect of adverse effect of drug, medicinal or biological substance, NECK PAIN, ACUTE (09/21/2010), Neuromuscular disorder (HCC), Nocturia, Osteoarthritis (112-17-12), Patent foramen ovale, Pneumonia, PONV (postoperative nausea and vomiting), Sleep apnea (09-05-11), SORE THROAT (04/30/2009), Stroke (HCC) (09-05-11), TIA (09/28/2007, 10/2014), Unstable angina (HCC), URI (09/02/2008), and Vertigo. ? ?She was seen by her CPE earlier this week via telephone visit for recurrent symptoms of head congestion, postnasal drip, and coughing up sputum.  Denied fever at this time.  She was treated for suspected URI and prescribed a Z-Pak. ? ?Today she reports that she is feeling no better ?Is started to feel more acutely ill.  Her symptoms currently include chest congestion, dry hacking cough, subjective fever, diarrhea, and fatigue. ? ?Review of Systems ?See HPI  ? ?Past Medical History:  ?Diagnosis Date  ? Bronchitis 09/2016  ? Bruises easily   ? DDD (degenerative disc disease)   ? Degenerative joint disease of cervical spine 09-05-11  ? Cervival area, now some osteoarthritis-lower back and Rt. shoulder  ? Depression   ? Diverticulitis 09-05-11  ? hx. gastritis, diverticulitis x2 -now surgery planned  ? DIVERTICULITIS, ACUTE 02/10/2010  ? Dyspnea   ? with exertion  ? Essential hypertension  09-05-11  ? tx. Verapamil  ? GROIN PAIN 09/21/2010  ? Headache(784.0)   ? headaches are better  ? Hearing loss   ? right ear  ? Heart palpitations   ? Hemorrhoids   ? History of bronchitis   ? History of colonic polyps   ? History of pneumonia   ? History of tension headache   ? Hyperlipidemia   ? Hypertension   ? Hypothyroidism 09-05-11  ? Supplement used  ? Late effect of adverse effect of drug, medicinal or biological substance   ? NECK PAIN, ACUTE 09/21/2010  ? Neuromuscular disorder (HCC)   ? carpal tunnel in both hands  ? Nocturia   ? Osteoarthritis 112-17-12  ? spine and rt. hip, rt. shoulder  ? Patent foramen ovale   ? Small - unable to be closed -   ? Pneumonia   ? PONV (postoperative nausea and vomiting)   ? Sleep apnea 09-05-11  ? no cpap ever, had surgery to remove cartilage, no problems now  ? SORE THROAT 04/30/2009  ? Stroke [facility]) 09-05-11  ? 2006/2009-(loss of memory, balance issues remains occ.)  ? TIA 09/28/2007, 10/2014  ? Unstable angina (HCC)   ? a. 05/2010 Cath: nl cors, EF 55%;  b. 05/2015 Lexiscan MV: small, severe, fixed apical defect and a small, severe, reversible inf lateral defect w/ apical thinning and mild ischemia, EF 54%.  ? URI 09/02/2008  ? Vertigo   ? ? ?Social History  ? ?Socioeconomic History  ? Marital status: Married  ?  Spouse name: Not on file  ? Number of children: Not on file  ? Years of education: Not on file  ? Highest education level: Not on file  ?Occupational History  ? Not on file  ?Tobacco Use  ?  Smoking status: Former  ?  Packs/day: 0.50  ?  Years: 5.00  ?  Pack years: 2.50  ?  Types: Cigarettes  ?  Quit date: 09/19/1968  ?  Years since quitting: 53.3  ? Smokeless tobacco: Never  ?Vaping Use  ? Vaping Use: Never used  ?Substance and Sexual Activity  ? Alcohol use: Yes  ?  Alcohol/week: 2.0 standard drinks  ?  Types: 1 Cans of beer, 1 Shots of liquor per week  ?  Comment: rarely  ? Drug use: No  ? Sexual activity: Yes  ?Other Topics Concern  ? Not on file  ?Social History  Narrative  ? [place].  She does not routinely exercise.  ? ?Social Determinants of Health  ? ?Financial Resource Strain: Not on file  ?Food Insecurity: Not on file  ?Transportation Needs: Not on file  ?Physical Activity: Not on file  ?Stress: Not on file  ?Social Connections: Not on file  ?Intimate Partner Violence: Not on file  ? ? ?Past Surgical History:  ?Procedure Laterality Date  ? ABDOMINAL HYSTERECTOMY    ? BACK SURGERY    ? CARDIAC CATHETERIZATION N/A 06/01/2015  ? Procedure: Left Heart Cath and Coronary Angiography;  Surgeon: Kathleene Hazelhristopher D McAlhany, MD;  [facility];  Service: Cardiovascular;  Laterality: N/A;  ? COLON RESECTION  09/07/2011  ? Procedure: LAPAROSCOPIC SIGMOID COLON RESECTION;  Surgeon: Mariella SaaBenjamin T Hoxworth, MD;  Location: [facility];  Service: General;  Laterality: N/A;  with proctoscopy  ? COLONOSCOPY  08/06/2015  ? per Dr. Kinnie ScalesMedoff, adenomatous polyps, repeat in 5 yrs   ? CYST EXCISION N/A 11/29/2016  ? Procedure: EXCISION UMBILICAL CYST;  Surgeon: Glenna FellowsBenjamin Hoxworth, MD;  Location: [facility];  Service: General;  Laterality: N/A;  ? ELBOW SURGERY  09-05-11  ? left elbow -ligament repair  ? EYE SURGERY    ? cataract  ? INSERTION OF MESH N/A 12/13/2013  ? Procedure: INSERTION OF MESH;  Surgeon: Mariella SaaBenjamin T Hoxworth, MD;  Location: [facility];  Service: General;  Laterality: N/A;  ? LAPAROSCOPY  09/07/2011  ? Procedure: LAPAROSCOPY DIAGNOSTIC;  Surgeon: Roselle LocusJames E Tomblin II;  Location: [facility];  Service: Gynecology;  Laterality: N/A;  ? MAXIMUM ACCESS (MAS)POSTERIOR LUMBAR INTERBODY FUSION (PLIF) 2 LEVEL N/A 05/03/2016  ? Procedure: L4-5 L5-S1 Maximum access posterior lumbar interbody fusion;  Surgeon: Maeola HarmanJoseph Stern, MD;  Location: [facility];  Service: Neurosurgery;  Laterality: N/A;  L4-5 L5-S1 Maximum access posterior lumbar interbody fusion  ? SALPINGOOPHORECTOMY  09/07/2011  ? Procedure: SALPINGO OOPHERECTOMY;  Surgeon: Roselle LocusJames E Tomblin II;  Location: [facility];  Service:  Gynecology;  Laterality: Right;  ? THYROIDECTOMY, PARTIAL  09-05-11  ? TOTAL HIP ARTHROPLASTY Right 08/21/2018  ? Procedure: TOTAL HIP ARTHROPLASTY ANTERIOR APPROACH;  Surgeon: Marcene Corningalldorf, Peter, MD;  Location: [facility];  Service: Orthopedics;  Laterality: Right;  ? TUBAL LIGATION  1983  ? UMBILICAL HERNIA REPAIR  yrs ago  ? VENTRAL HERNIA REPAIR  06/20/2012  ? Procedure: LAPAROSCOPIC VENTRAL HERNIA;  Surgeon: Mariella SaaBenjamin T Hoxworth, MD;  Location: [facility];  Service: General;  Laterality: N/A;  Laparoscopic Repair of Ventral Hernia with mesh  ? VENTRAL HERNIA REPAIR N/A 12/13/2013  ? Procedure: LAPAROSCOPIC REPAIR RECURRENT VENTRAL INCISIONAL  HERNIA;  Surgeon: Mariella SaaBenjamin T Hoxworth, MD;  Location: [facility];  Service: General;  Laterality: N/A;  ? ? ?Family History  ?Problem Relation Age of Onset  ? Arrhythmia Mother   ? Hypertension Mother   ?  Stroke Mother   ? Stroke Maternal Grandmother   ? Diabetes Paternal Grandmother   ? Breast cancer Sister   ?     33s  ? Breast cancer Sister   ?     36s  ? ? ?Allergies  ?Allergen Reactions  ? Dilaudid [Hydromorphone Hcl] Shortness Of Breath, Nausea And Vomiting and Other (See Comments)  ?  Able to take morphine without issue  ? Shellfish Allergy Shortness Of Breath and Rash  ?  Only shrimp allergy  ? Aggrenox [Aspirin-Dipyridamole Er] Nausea And Vomiting and Other (See Comments)  ?  Headache ?  ? Codeine Nausea And Vomiting  ? Doxycycline Monohydrate Diarrhea  ? Cephalexin Itching and Rash  ? Codeine Phosphate Nausea And Vomiting  ? Hydrocodone Nausea And Vomiting and Other (See Comments)  ?  Can take morphine without issue  ? Meperidine Hcl Nausea And Vomiting  ? Percocet [Oxycodone-Acetaminophen] Nausea And Vomiting and Other (See Comments)  ?  Can take morphine without issue  ? ? ?Current Outpatient Medications on File Prior to Visit  ?Medication Sig Dispense Refill  ? amLODipine (NORVASC) 10 MG tablet Take 1 tablet (10 mg total) by mouth daily. 30 tablet 9  ? atorvastatin (LIPITOR) 40  MG tablet Take 1 tablet (40 mg total) by mouth daily. 90 tablet 1  ? azithromycin (ZITHROMAX Z-PAK) 250 MG tablet As directed 6 each 0  ? butalbital-acetaminophen-caffeine (FIORICET) 50-325-40 MG tablet T

## 2022-01-19 ENCOUNTER — Telehealth: Payer: Self-pay | Admitting: Family Medicine

## 2022-01-19 ENCOUNTER — Telehealth: Payer: Self-pay | Admitting: Adult Health

## 2022-01-19 NOTE — Telephone Encounter (Signed)
Pt seeking advise on the next steps in her treatment. Was dx with covid 01/14/22 and wants to know when or if she needs to be re-tested.  ?

## 2022-01-19 NOTE — Telephone Encounter (Signed)
Please advise 

## 2022-01-19 NOTE — Telephone Encounter (Signed)
Patient notified of update  and verbalized understanding. 

## 2022-01-31 ENCOUNTER — Telehealth: Payer: Self-pay | Admitting: Family Medicine

## 2022-01-31 NOTE — Telephone Encounter (Signed)
Left message for patient to call back and schedule Medicare Annual Wellness Visit (AWV) either virtually or in office. Left  my jabber number [phone]  AWV-I PER [place] AS OF 09/19/09 please schedule at anytime with [facility] Nurse Health Advisor 1 or 2   This should be a 45 minute visit.  

## 2022-02-05 ENCOUNTER — Other Ambulatory Visit: Payer: Self-pay | Admitting: Adult Health

## 2022-02-05 DIAGNOSIS — U071 COVID-19: Secondary | ICD-10-CM

## 2022-02-15 ENCOUNTER — Ambulatory Visit: Payer: Medicare PPO | Admitting: Neurology

## 2022-02-15 ENCOUNTER — Encounter: Payer: Self-pay | Admitting: Neurology

## 2022-02-15 VITALS — BP 132/81 | HR 68 | Ht 65.6 in | Wt 202.8 lb

## 2022-02-15 DIAGNOSIS — Z9181 History of falling: Secondary | ICD-10-CM | POA: Diagnosis not present

## 2022-02-15 DIAGNOSIS — R2689 Other abnormalities of gait and mobility: Secondary | ICD-10-CM

## 2022-02-15 DIAGNOSIS — I69398 Other sequelae of cerebral infarction: Secondary | ICD-10-CM

## 2022-02-15 NOTE — Patient Instructions (Signed)
I had a long discussion with the patient regarding her gait imbalance and fall risk and answered questions.  Reviewed results of EMG nerve conduction study as well as MRI scan of the brain and MR angiogram of the brain and neck and answered questions.  I recommend she stay on Plavix for stroke prevention and maintain aggressive risk factor modification with strict control of hypertension with blood pressure goal below 130/90, lipids with LDL cholesterol goal below 70 mg percent and diabetes with hemoglobin A1c goal below 6.5%.  We also discussed fall prevention precautions and encouraged to use a cane at all times and to consider using a walker while walking outdoors for long distances.  She will return for follow-up in the future in a year or call earlier if necessary. Fall Prevention in the Home, Adult Falls can cause injuries and affect people of all ages. There are many simple things that you can do to make your home safe and to help prevent falls. Ask for help when making these changes, if needed. What actions can I take to prevent falls? General instructions Use good lighting in all rooms. Replace any light bulbs that burn out, turn on lights if it is dark, and use night-lights. Place frequently used items in easy-to-reach places. Lower the shelves around your home if necessary. Set up furniture so that there are clear paths around it. Avoid moving your furniture around. Remove throw rugs and other tripping hazards from the floor. Avoid walking on wet floors. Fix any uneven floor surfaces. Add color or contrast paint or tape to grab bars and handrails in your home. Place contrasting color strips on the first and last steps of staircases. When you use a stepladder, make sure that it is completely opened and that the sides and supports are firmly locked. Have someone hold the ladder while you are using it. Do not climb a closed stepladder. Know where your pets are when moving through your  home. What can I do in the bathroom?     Keep the floor dry. Immediately clean up any water that is on the floor. Remove soap buildup in the tub or shower regularly. Use nonskid mats or decals on the floor of the tub or shower. Attach bath mats securely with double-sided, nonslip rug tape. If you need to sit down while you are in the shower, use a plastic, nonslip stool. Install grab bars by the toilet and in the tub and shower. Do not use towel bars as grab bars. What can I do in the bedroom? Make sure that a bedside light is easy to reach. Do not use oversized bedding that reaches the floor. Have a firm chair that has side arms to use for getting dressed. What can I do in the kitchen? Clean up any spills right away. If you need to reach for something above you, use a sturdy step stool that has a grab bar. Keep electrical cables out of the way. Do not use floor polish or wax that makes floors slippery. If you must use wax, make sure that it is non-skid floor wax. What can I do with my stairs? Do not leave any items on the stairs. Make sure that you have a light switch at the top and the bottom of the stairs. Have them installed if you do not have them. Make sure that there are handrails on both sides of the stairs. Fix handrails that are broken or loose. Make sure that handrails are as long as  the staircases. Install non-slip stair treads on all stairs in your home. Avoid having throw rugs at the top or bottom of stairs, or secure the rugs with carpet tape to prevent them from moving. Choose a carpet design that does not hide the edge of steps on the stairs. Check any carpeting to make sure that it is firmly attached to the stairs. Fix any carpet that is loose or worn. What can I do on the outside of my home? Use bright outdoor lighting. Regularly repair the edges of walkways and driveways and fix any cracks. Remove high doorway thresholds. Trim any shrubbery on the main path into  your home. Regularly check that handrails are securely fastened and in good repair. Both sides of all steps should have handrails. Install guardrails along the edges of any raised decks or porches. Clear walkways of debris and clutter, including tools and rocks. Have leaves, snow, and ice cleared regularly. Use sand or salt on walkways during winter months. In the garage, clean up any spills right away, including grease or oil spills. What other actions can I take? Wear closed-toe shoes that fit well and support your feet. Wear shoes that have rubber soles or low heels. Use mobility aids as needed, such as canes, walkers, scooters, and crutches. Review your medicines with your health care provider. Some medicines can cause dizziness or changes in blood pressure, which increase your risk of falling. Talk with your health care provider about other ways that you can decrease your risk of falls. This may include working with a physical therapist or trainer to improve your strength, balance, and endurance. Where to find more information Centers for Disease Control and Prevention, [employer]: FootballExhibition.com.br [employer] on Aging: https://walker.com/ Contact a health care provider if: You are afraid of falling at home. You feel weak, drowsy, or dizzy at home. You fall at home. Summary There are many simple things that you can do to make your home safe and to help prevent falls. Ways to make your home safe include removing tripping hazards and installing grab bars in the bathroom. Ask for help when making these changes in your home. This information is not intended to replace advice given to you by your health care provider. Make sure you discuss any questions you have with your health care provider. Document Revised: 06/07/2021 Document Reviewed: 04/08/2020 Elsevier Patient Education  2023 [employer].

## 2022-02-15 NOTE — Progress Notes (Signed)
[facility] [address] [place]. [place] [address] [phone]       OFFICE FOLLOW-UP NOTE  Abigail. Abigail [name] Date of Birth:  [birth year] Medical Record Number:  [id number]   HPI: Update 09/27/2021 Abigail [name] is a 76 year old African-American lady with longstanding history of a multifactorial gait and balance difficulties due to combination of old right pontine and cerebellar strokes as well as left thalamic strokes.  Vascular risk factors of hypertension, hyperlipidemia, history of sleep apnea and multiple strokes.  She had seen me in the clinic several times with last visit being on 04/01/2019.  She returns today for worsening of her existing symptoms.  For the last 9 months she has noticed that her balance is difficult.  She tires easily.  She has been using a cane almost constantly.  She still feels that she is starting to 1 side and not sure of her balance.  She does have chronic low back pain and right hip pain.  She has seen Dr. Vertell Limber in the past she had refused back surgery.  She continues to have severe right hip and thigh pain which also limits her walking.  She recently had significant elevation in the blood pressure and saw her cardiologist who increased her medications.  She denies any tingling numbness or pain or burning in her feet.  She however had an episode of gout and painful swollen toes for which she saw her primary physician who gave her a course of steroids which seems to have helped.  She has not had any recent visits with her orthopedic surgeon or with Dr. Vertell Limber her back surgeon.  Review of electronic medical records show that she had lab work on 08/20/2021 which showed LDL cholesterol of 82 mg percent.  Her last hemoglobin A1c was on 11/24/20) 6.1.  Last carotid ultrasound was on 04/06/2019 and was unremarkable. Initial Consult 06/03/2015 :  Abigail [name] is a 6 year pleasant lady whose had five-month history of gait and balance difficulties. She just feels she often leans to  the left and stumbles if she is not careful. She feels the symptoms began suddenly one day when she noticed trouble with balance and walking she also had some trouble swallowing but she did not seek immediate help at that time. She does have a prior history of her left thalamic lacunar infarct in 2007 for which she had seen me. That was felt to be due to small vessel disease and she was started on Aggrenox which did not tolerate due to side effects. She was lost to medical follow-up to me. She is recently had an outpatient MRI scan of the brain which have personally reviewed on 05/02/15 which does show a right pontine and cerebellar hyperintensity which is likely a age indeterminate lacunar infarcts which were not noticed on the previous MRI and perhaps may be the explanation of her recent dizziness and gait difficulties. The patient is currently on Plavix which is tolerating well without bleeding or bruising. She states her blood pressure has been difficult to control and today it is elevated at 171/97 in office. She did also had some recent chest pain or shortness of breath for which she underwent cardiac catheterization last Monday which did not show any correctable lesion. Dr. Stanford Breed for cardiologist has also added a baby aspirin and she seems to be having increased bruising tendency since then. She's not sure when her last lipid profile was checked but she does take Lipitor 40 mg daily which is  tolerating well without side effects. She does have history of sleep apnea and had surgery for that and does not use CPAP Update 11/17/15 : She returns for follow-up after last visit 5 months ago. She is accompanied by husband. Patient states she was doing fine until a few weeks ago since then she's noticed intermittent tingling in the left hand as well as weakness and dropping of objects from that hand. She's also noticed some walking difficulty and having to drag her left leg. She denies any slurred speech, headache,  fall or injury. She was seen by me in September of last year and at that time MRI scan of the brain prior to that visit had shown small remote age lacunar infarcts in the right cerebellum and pons. I had ordered MRA of the neck and brain which have personally reviewed done on 06/03/15 which showed no significant intra-or extracranial stenosis. Lab work on 06/03/15 showed LDL cholesterol 84 but HDL of 77 with the ratio of 2.3. Hemoglobin A1c was borderline at 6.0. Patient has been on Plavix since 2006 following her previous stroke. She was unable to tolerate Aggrenox at that time. Patient states she is having gradual worsening of her balance and walking since spring of last year and perhaps had a small stroke at that time which was not recognized. She states she is tolerating Plavix well without bleeding or bruising. Her blood pressure is well controlled and today it is 139/76. The patient is refusing a referral to physical outpatient therapy but she wants to go and participate in the Silver sneakers program at the [facility]. She is tolerating Lipitor without myalgias or arthralgias. She does use a CPAP for sleep apnea. Update 05/19/2017 ; she returns for follow-up after last visit for year and a half ago. She however was admitted to the hospital in June 2018 with a right lip and hand paresthesias. MRI scan did show a small left thalamic lacunar infarct. MRA of the brain was unremarkable. Carotid Doppler showed no significant extracranial stenosis. Transthoracic echo showed normal ejection fraction. LDL cholesterol was borderline at 72 mg percent. Hemoglobin A1c was 6.1. Patient was started on aspirin 81 and Plavix 75 mg per 3 weeks and since then she has discontinued aspirin and is currently on Plavix alone. She is tolerating well without bruising or bleeding. She states her right lip numbness has resolved but right little finger in [place] persists. She has seen Dr. Vertell Limber for back pain and he has discontinued both) and  replace it with Aleve and tramadol. Patient also complains of subjective mild memory difficulties since her stroke. She has trouble remembering recent information  and names. Update 10/31/2018 : She returns for follow-up after last visit a year and half ago.  She is accompanied by her husband.  Patient is doing well from stroke standpoint without recurrent stroke or TIA symptoms.  She states she had hip replacement surgery in December 2019 and a few days later developed leg pain and swelling and was diagnosed as femoral vein deep vein thrombosis.  She has since stopped Plavix and has been switched to Xarelto which she seems to be tolerating well without bleeding or bruising.  She states her blood pressure is under good control though today it is slightly elevated at 1 four 8/83.  She is tolerating Lipitor well without any side effects.  Her last lipid profile was satisfactory.  She has no new neurological complaints.  She is walking with a cane and has had no falls or  injuries.  She states her mild subjective memory difficulties are unchanged and are not getting worse Update 04/01/2019 : She returns for follow-up after last visit 5 months ago.  She has not had any recurrent stroke or TIA symptoms.  She continues to have back pain and hip pain and walking difficulties mostly related to her bad hip.  She had hip replacement surgery in December but has had persistent pain.  She recently underwent epidural back injection by orthopedic surgeon without significant relief.  She complains of difficulty swallowing for the last 1 month which is intermittent and occurs once or twice a week.  This is mainly when she is trying to swallow liquids.  She initially has trouble initiating it but once she can swallow the fluid goes down well.  She denies any nasal regurgitation coughing or gagging.  She is tolerating Plavix well with only minor bruising and no bleeding.  Her blood pressures well controlled and today it is 140/78.  She  remains on Lipitor which is tolerating well and states lipid profile was checked only last year was satisfactory.  She has not had follow-up carotid ultrasound and for more than a year.  Update 02/15/2022 : She returns for follow-up after last visit 5 months ago.  Patient continues to have gait and balance difficulties which are unchanged.  She is using a cane now mostly all the time she has had a couple of near falls 2.  She fell once on the gravel but luckily did not have any major injuries.  She did undergo EMG nerve conduction study on 11/25/2021 which was normal and did not show any underlying neuropathy or radiculopathy.  MRI scan of the brain on 10/21/2021 showed no acute abnormalities.  Extensive changes of small vessel disease were noted.  MR angiogram of the brain and neck did not reveal any major large vessel stenosis or occlusion.  She will develop COVID in April and was symptomatic for 3 to 4 days and was treated as an outpatient with primary care physician.  She remains on Plavix which is tolerating well with only easy bruising but no bleeding episodes.  Her blood pressures under good control and today it is 132/81.  She remains on Lipitor which is tolerating well without muscle aches and pains and last lipid profile on 09/29/2021 showed LDL cholesterol 92 mg percent.  Hemoglobin A1c was 5.9.  She has no other new complaints.  She has had no recurrent stroke or TIA symptoms. ROS:   14 system review of systems is positive for bruising, sore throat, hip pain, thigh pain, difficulty walking, imbalance, back pain, gout, trouble swallowing, headache all other systems negative  PMH:  Past Medical History:  Diagnosis Date   Bronchitis 09/2016   Bruises easily    DDD (degenerative disc disease)    Degenerative joint disease of cervical spine 09-05-11   Cervival area, now some osteoarthritis-lower back and Rt. shoulder   Depression    Diverticulitis 09-05-11   hx. gastritis, diverticulitis x2 -now  surgery planned   DIVERTICULITIS, ACUTE 02/10/2010   Dyspnea    with exertion   Essential hypertension 09-05-11   tx. Verapamil   GROIN PAIN 09/21/2010   Headache(784.0)    headaches are better   Hearing loss    right ear   Heart palpitations    Hemorrhoids    History of bronchitis    History of colonic polyps    History of pneumonia    History of tension headache  Hyperlipidemia    Hypertension    Hypothyroidism 09-05-11   Supplement used   Late effect of adverse effect of drug, medicinal or biological substance    NECK PAIN, ACUTE 09/21/2010   Neuromuscular disorder ([place])    carpal tunnel in both hands   Nocturia    Osteoarthritis 112-17-12   spine and rt. hip, rt. shoulder   Patent foramen ovale    Small - unable to be closed -    Pneumonia    PONV (postoperative nausea and vomiting)    Sleep apnea 09-05-11   no cpap ever, had surgery to remove cartilage, no problems now   SORE THROAT 04/30/2009   Stroke ([place]) 09-05-11   2006/2009-(loss of memory, balance issues remains occ.)   TIA 09/28/2007, 10/2014   Unstable angina ([place])    a. 05/2010 Cath: nl cors, EF 55%;  b. 05/2015 Lexiscan MV: small, severe, fixed apical defect and a small, severe, reversible inf lateral defect w/ apical thinning and mild ischemia, EF 54%.   URI 09/02/2008   Vertigo     Social History:  Social History   Socioeconomic History   Marital status: Married    Spouse name: Not on file   Number of children: Not on file   Years of education: Not on file   Highest education level: Not on file  Occupational History   Not on file  Tobacco Use   Smoking status: Former    Packs/day: 0.50    Years: 5.00    Pack years: 2.50    Types: Cigarettes    Quit date: 09/19/1968    Years since quitting: 53.4   Smokeless tobacco: Never  Vaping Use   Vaping Use: Never used  Substance and Sexual Activity   Alcohol use: Yes    Alcohol/week: 2.0 standard drinks    Types: 1 Cans of beer, 1 Shots of liquor per  week    Comment: rarely   Drug use: No   Sexual activity: Yes  Other Topics Concern   Not on file  Social History Narrative   Lives in [place] with her husband.  She does not routinely exercise.   Social Determinants of Health   Financial Resource Strain: Not on file  Food Insecurity: Not on file  Transportation Needs: Not on file  Physical Activity: Not on file  Stress: Not on file  Social Connections: Not on file  Intimate Partner Violence: Not on file    Medications:   Current Outpatient Medications on File Prior to Visit  Medication Sig Dispense Refill   albuterol (VENTOLIN HFA) 108 (90 Base) MCG/ACT inhaler Inhale 2 puffs into the lungs every 6 (six) hours as needed for wheezing or shortness of breath. 8 g 0   amLODipine (NORVASC) 10 MG tablet Take 1 tablet (10 mg total) by mouth daily. 30 tablet 9   atorvastatin (LIPITOR) 40 MG tablet Take 1 tablet (40 mg total) by mouth daily. 90 tablet 1   azithromycin (ZITHROMAX Z-PAK) 250 MG tablet As directed 6 each 0   benzonatate (TESSALON) 200 MG capsule Take 1 capsule (200 mg total) by mouth every 6 (six) hours as needed for cough. 60 capsule 0   butalbital-acetaminophen-caffeine (FIORICET) 50-325-40 MG tablet TAKE 1 TABLET BY MOUTH EVERY 6 HOURS AS NEEDED FOR HEADACHE 60 tablet 5   clopidogrel (PLAVIX) 75 MG tablet TAKE 1 TABLET BY MOUTH EVERY DAY 90 tablet 1   furosemide (LASIX) 20 MG tablet Take 1 tablet (20 mg total) by mouth  daily. (Patient taking differently: Take 20 mg by mouth daily as needed.) 30 tablet 5   levothyroxine (SYNTHROID) 125 MCG tablet TAKE 1 TABLET BY MOUTH DAILY BEFORE BREAKFAST. 90 tablet 1   losartan (COZAAR) 100 MG tablet TAKE 1 TABLET BY MOUTH EVERY DAY 90 tablet 3   metoprolol succinate (TOPROL-XL) 25 MG 24 hr tablet TAKE 1 TABLET BY MOUTH EVERY DAY. (NEED APPT FOR REFILLS) 30 tablet 2   tetrahydrozoline 0.05 % ophthalmic solution Place 1 drop into both eyes 4 (four) times daily as needed (for dry/irritated  eyes.).     traMADol (ULTRAM) 50 MG tablet TAKE 1 TO 2 TABLETS BY MOUTH EVERY 4 TO 6 HOURS AS NEEDED FOR PAIN 120 tablet 5   [DISCONTINUED] potassium chloride (KLOR-CON) 10 MEQ tablet TAKE 1 TABLET BY MOUTH EVERY DAY 90 tablet 1   [DISCONTINUED] pregabalin (LYRICA) 75 MG capsule Take 1 capsule (75 mg total) by mouth at bedtime. 14 capsule 0   No current facility-administered medications on file prior to visit.    Allergies:   Allergies  Allergen Reactions   Dilaudid [Hydromorphone Hcl] Shortness Of Breath, Nausea And Vomiting and Other (See Comments)    Able to take morphine without issue   Shellfish Allergy Shortness Of Breath and Rash    Only shrimp allergy   Aggrenox [Aspirin-Dipyridamole Er] Nausea And Vomiting and Other (See Comments)    Headache    Codeine Nausea And Vomiting   Doxycycline Monohydrate Diarrhea   Cephalexin Itching and Rash   Codeine Phosphate Nausea And Vomiting   Hydrocodone Nausea And Vomiting and Other (See Comments)    Can take morphine without issue   Meperidine Hcl Nausea And Vomiting   Percocet [Oxycodone-Acetaminophen] Nausea And Vomiting and Other (See Comments)    Can take morphine without issue    Physical Exam  Vitals:   02/15/22 1442  BP: 132/81  Pulse: 68   General: Mildly obese elderly African-American lady , seated, in no evident distress Head: head normocephalic and atraumatic.   Neck: supple with no carotid or supraclavicular bruits Cardiovascular: regular rate and rhythm, no murmurs Musculoskeletal: no deformity Skin:  no rash/petichiae Vascular:  Normal pulses all extremities   Neurologic Exam Mental Status: Awake and fully alert. Oriented to place and time. Recent and remote memory intact. Attention span, concentration and fund of knowledge appropriate. Mood and affect appropriate.  Cranial Nerves: Fundoscopic exam not done. Pupils equal, briskly reactive to light. Extraocular movements full without nystagmus. Visual fields  full to confrontation. Hearing intact. Facial sensation intact. Face, tongue, palate moves normally and symmetrically.  Motor: Normal bulk and tone.Mild weakness of left grip and intrinsic hand muscles. Orbits right over left upper extremity. Mild weakness of right hip flexors, extensors and adductor's due to pain,   Sensory.: intact to touch , pinprick , position and vibratory sensation. Mild subjective paresthesias in the right little finger but no objective sensory loss Coordination: Rapid alternating movements normal in all extremities. Finger-to-nose and heel-to-shin performed accurately bilaterally. Gait and Station: Arises from chair without difficulty. Stance is slightly broad-based Gait demonstrates favoring of the right hip due to pain and stiffness and dragging of the right leg.. Unable to heel, toe and tandem walk without difficulty.  She uses a cane for walking. Reflexes: 1+ and symmetric. Toes downgoing.         ASSESSMENT: 77 year old lady with 3-year history of worsening chronic multifactorial gait and balance difficulties following remote right pontine and cerebellar infarcts in 2016 as  well as left thalamic infarct in 2007 which appeared to have worsened the last 9 months or so.  She also has significant degenerative spine disease and right hip arthritis which is also contributing.  Recent neurovascular work-up as well as EMG nerve conduction studies did not show any new contributing etiologies.    PLAN:I had a long discussion with the patient regarding her gait imbalance and fall risk and answered questions.  Reviewed results of EMG nerve conduction study as well as MRI scan of the brain and MR angiogram of the brain and neck and answered questions.  I recommend she stay on Plavix for stroke prevention and maintain aggressive risk factor modification with strict control of hypertension with blood pressure goal below 130/90, lipids with LDL cholesterol goal below 70 mg percent and  diabetes with hemoglobin A1c goal below 6.5%.  We also discussed fall prevention precautions and encouraged to use a cane at all times and to consider using a walker while walking outdoors for long distances.  She will return for follow-up in the future in a year or call earlier if necessary. Greater than 50% of time during this 35-minute visit was spent on counseling,explanation of diagnosis, planning of further management, discussion with patient and family and coordination of care Antony Contras MD Note: This document was prepared with digital dictation and possible smart phrase technology. Any transcriptional errors that result from this process are unintentional

## 2022-02-18 ENCOUNTER — Ambulatory Visit: Payer: Medicare PPO | Admitting: Family Medicine

## 2022-02-21 ENCOUNTER — Encounter: Payer: Self-pay | Admitting: Family Medicine

## 2022-02-21 ENCOUNTER — Ambulatory Visit: Payer: Medicare PPO | Admitting: Family Medicine

## 2022-02-21 VITALS — BP 128/82 | HR 68 | Temp 98.9°F | Wt 198.0 lb

## 2022-02-21 DIAGNOSIS — M1611 Unilateral primary osteoarthritis, right hip: Secondary | ICD-10-CM

## 2022-02-21 DIAGNOSIS — E1065 Type 1 diabetes mellitus with hyperglycemia: Secondary | ICD-10-CM | POA: Diagnosis not present

## 2022-02-21 DIAGNOSIS — I639 Cerebral infarction, unspecified: Secondary | ICD-10-CM

## 2022-02-21 DIAGNOSIS — R42 Dizziness and giddiness: Secondary | ICD-10-CM

## 2022-02-21 DIAGNOSIS — R739 Hyperglycemia, unspecified: Secondary | ICD-10-CM | POA: Diagnosis not present

## 2022-02-21 DIAGNOSIS — I10 Essential (primary) hypertension: Secondary | ICD-10-CM | POA: Diagnosis not present

## 2022-02-21 DIAGNOSIS — M47892 Other spondylosis, cervical region: Secondary | ICD-10-CM | POA: Diagnosis not present

## 2022-02-21 NOTE — Progress Notes (Signed)
   Subjective:    Patient ID: Abigail [name], female    DOB: [birth year], 76 y.o.   MRN: [id number]  HPI Here to follow up on issues. She is doing well overall. She is walking better, and she uses her cane at all times. She saw Dr. Pearlean Brownie last week, and he felt she was doing well from a neurologic standpoint. She sees Dr. Yisroel Ramming and he intends to do total shoulder replacements when she is stable enough.    Review of Systems  Constitutional: Negative.  Negative for activity change.  Respiratory: Negative.    Cardiovascular: Negative.   Neurological:  Positive for dizziness and weakness.      Objective:   Physical Exam Constitutional:      Appearance: Normal appearance.  Cardiovascular:     Rate and Rhythm: Normal rate and regular rhythm.     Pulses: Normal pulses.     Heart sounds: Normal heart sounds.  Pulmonary:     Effort: Pulmonary effort is normal.     Breath sounds: Normal breath sounds.  Neurological:     Mental Status: She is alert.          Assessment & Plan:  She is doing well recovering from her stroke. Dr. Pearlean Brownie and I both agree she should be using a walker, but she refuses to give up her cane. Her HTN is stable. We will check another A1c today.  Gershon Crane, MD

## 2022-02-22 LAB — HEMOGLOBIN A1C: Hgb A1c MFr Bld: 6.3 % (ref 4.6–6.5)

## 2022-03-01 ENCOUNTER — Other Ambulatory Visit: Payer: Self-pay | Admitting: Family Medicine

## 2022-03-01 DIAGNOSIS — E7800 Pure hypercholesterolemia, unspecified: Secondary | ICD-10-CM

## 2022-03-02 ENCOUNTER — Telehealth: Payer: Self-pay | Admitting: Family Medicine

## 2022-03-02 NOTE — Telephone Encounter (Signed)
Left message for patient to call back and schedule Medicare Annual Wellness Visit (AWV) either virtually or in office. Left  my jabber number [phone]  AWV-I PER [place] AS OF 09/19/09 please schedule at anytime with [facility] Nurse Health Advisor 1 or 2   This should be a 45 minute visit.  

## 2022-03-04 NOTE — Telephone Encounter (Signed)
error 

## 2022-03-30 ENCOUNTER — Other Ambulatory Visit: Payer: Self-pay | Admitting: Family Medicine

## 2022-03-30 DIAGNOSIS — Z1231 Encounter for screening mammogram for malignant neoplasm of breast: Secondary | ICD-10-CM

## 2022-04-06 ENCOUNTER — Ambulatory Visit
Admission: RE | Admit: 2022-04-06 | Discharge: 2022-04-06 | Disposition: A | Discharge status: Home / Self Care | Payer: Medicare PPO | Source: Ambulatory Visit | Attending: Family Medicine | Admitting: Family Medicine

## 2022-04-06 DIAGNOSIS — Z1231 Encounter for screening mammogram for malignant neoplasm of breast: Secondary | ICD-10-CM | POA: Diagnosis not present

## 2022-04-08 DIAGNOSIS — N760 Acute vaginitis: Secondary | ICD-10-CM | POA: Diagnosis not present

## 2022-04-12 ENCOUNTER — Other Ambulatory Visit: Payer: Self-pay | Admitting: Cardiology

## 2022-04-12 DIAGNOSIS — B356 Tinea cruris: Secondary | ICD-10-CM | POA: Diagnosis not present

## 2022-04-22 ENCOUNTER — Telehealth: Payer: Self-pay | Admitting: Family Medicine

## 2022-04-22 NOTE — Telephone Encounter (Signed)
Left message for patient to call back and schedule Medicare Annual Wellness Visit (AWV) either virtually or in office. Left  my Zachery Conch number [phone]  AWV-I PER [place] AS OF 09/19/09 please schedule at anytime with [facility] Nurse Health Advisor 1 or 2   This should be a 45 minute visit.

## 2022-05-05 ENCOUNTER — Other Ambulatory Visit: Payer: Self-pay | Admitting: Family Medicine

## 2022-05-05 ENCOUNTER — Other Ambulatory Visit: Payer: Self-pay | Admitting: Cardiology

## 2022-05-06 NOTE — Telephone Encounter (Signed)
Last refill-11/16/21-60 tabs, 5 refills Last OV-02/21/22  No future OV scheduled

## 2022-05-23 ENCOUNTER — Other Ambulatory Visit: Payer: Self-pay | Admitting: Family Medicine

## 2022-05-24 NOTE — Telephone Encounter (Signed)
Pt LOV was done on 02/21/22 Last refill was done on 10/20/21 Please advise

## 2022-05-25 ENCOUNTER — Telehealth: Payer: Self-pay | Admitting: Family Medicine

## 2022-05-25 NOTE — Telephone Encounter (Signed)
Left message for patient to call back and schedule Medicare Annual Wellness Visit (AWV) either virtually or in office. Left  my jabber number [phone]  AWV-I PER [place] AS OF 09/19/09 please schedule at anytime with [facility] Nurse Health Advisor 1 or 2   This should be a 45 minute visit.  

## 2022-05-28 ENCOUNTER — Other Ambulatory Visit: Payer: Self-pay | Admitting: Family Medicine

## 2022-05-28 DIAGNOSIS — E7800 Pure hypercholesterolemia, unspecified: Secondary | ICD-10-CM

## 2022-06-01 ENCOUNTER — Encounter: Payer: Self-pay | Admitting: Family Medicine

## 2022-06-01 ENCOUNTER — Other Ambulatory Visit: Payer: Self-pay | Admitting: Adult Health

## 2022-06-01 ENCOUNTER — Ambulatory Visit: Payer: Medicare PPO | Admitting: Family Medicine

## 2022-06-01 VITALS — BP 126/80 | HR 49 | Temp 98.3°F | Wt 206.0 lb

## 2022-06-01 DIAGNOSIS — M25472 Effusion, left ankle: Secondary | ICD-10-CM | POA: Diagnosis not present

## 2022-06-01 DIAGNOSIS — I10 Essential (primary) hypertension: Secondary | ICD-10-CM | POA: Diagnosis not present

## 2022-06-01 DIAGNOSIS — M25471 Effusion, right ankle: Secondary | ICD-10-CM

## 2022-06-01 DIAGNOSIS — M79674 Pain in right toe(s): Secondary | ICD-10-CM

## 2022-06-01 DIAGNOSIS — U071 COVID-19: Secondary | ICD-10-CM

## 2022-06-01 NOTE — Progress Notes (Signed)
   Subjective:    Patient ID: Abigail [name], female    DOB: Apr 05, 1946, 76 y.o.   MRN: [id number]  HPI Here for several issues. First she has had pain in the right great toe for the past 3 months. No hx of trauma. She wears sneakers most of the time. She asks if this could be gout. Also we had started her on Lasix every day for swelling in the ankles, but she stopped taking it because she had to urinate so frequently. Now the swelling is back.    Review of Systems  Constitutional: Negative.   Respiratory: Negative.    Cardiovascular:  Positive for leg swelling. Negative for chest pain and palpitations.  Musculoskeletal:  Positive for arthralgias.       Objective:   Physical Exam Constitutional:      Appearance: Normal appearance.     Comments: Walks with a cane   Cardiovascular:     Rate and Rhythm: Normal rate and regular rhythm.     Pulses: Normal pulses.     Heart sounds: Normal heart sounds.  Pulmonary:     Effort: Pulmonary effort is normal.     Breath sounds: Normal breath sounds.  Musculoskeletal:     Comments: 1+ edema in both ankles. She is quite tender in the right great MTP joint. No swelling or erythema or warmth   Neurological:     Mental Status: She is alert.           Assessment & Plan:  I told her that the toe pain is not due to gout, but instead is likely due to OA. She can apply ice 1-2 times a day as needed. She is scheduled to see Dr. Margreta Journey for an orthopedic follow up on 06-06-22, so she can ask him to look at the toe as well. For the ankle edema, I suggested she take the Lasix as needed rather than every day.  Gershon Crane, MD

## 2022-06-06 DIAGNOSIS — M19071 Primary osteoarthritis, right ankle and foot: Secondary | ICD-10-CM | POA: Diagnosis not present

## 2022-06-14 ENCOUNTER — Ambulatory Visit: Payer: Medicare PPO | Attending: Physician Assistant | Admitting: Physician Assistant

## 2022-06-14 ENCOUNTER — Encounter: Payer: Self-pay | Admitting: Physician Assistant

## 2022-06-14 VITALS — BP 128/74 | HR 60 | Ht 65.5 in | Wt 202.4 lb

## 2022-06-14 DIAGNOSIS — I10 Essential (primary) hypertension: Secondary | ICD-10-CM | POA: Diagnosis not present

## 2022-06-14 DIAGNOSIS — E785 Hyperlipidemia, unspecified: Secondary | ICD-10-CM

## 2022-06-14 DIAGNOSIS — R002 Palpitations: Secondary | ICD-10-CM

## 2022-06-14 DIAGNOSIS — Z8673 Personal history of transient ischemic attack (TIA), and cerebral infarction without residual deficits: Secondary | ICD-10-CM | POA: Diagnosis not present

## 2022-06-14 MED ORDER — METOPROLOL SUCCINATE ER 25 MG PO TB24: ORAL_TABLET | ORAL | 3 refills | Status: DC

## 2022-06-14 MED ORDER — AMLODIPINE BESYLATE 10 MG PO TABS: 10.0000 mg | ORAL_TABLET | Freq: Every day | ORAL | 3 refills | Status: DC

## 2022-06-14 NOTE — Progress Notes (Unsigned)
Cardiology Office Note:    Date:  06/16/2022   ID:  Abigail [name], DOB June 30, 1946, MRN [id number]  PCP:  Nelwyn Salisbury, MD   [facility] Providers Cardiologist:  [name], MD     Referring MD: Nelwyn Salisbury, MD   Chief Complaint  Patient presents with   Follow-up    Seen for Dr. Jens Som    History of Present Illness:    Abigail [name] is a 76 y.o. female with a hx of palpitation, presyncope, hypertension, hyperlipidemia, DVT, and a history of CVA.  Previous cardiac catheterization in September 2011 showed normal coronary arteries, EF 55%.  She was noted to have PFO on TEE echocardiogram in 2006 following CVA.  Event monitor in 2011 showed palpitation with sinus rhythm and PACs.  Lexiscan Myoview in September 2016 showed apical thinning and mild ischemia in the inferolateral wall.  Cardiac catheterization performed on 05/2015 showed nonobstructive CAD with normal LV function.  Carotid Doppler in June 2018 demonstrated 1 to 39% bilateral stenosis.  Echocardiogram in March 2019 showed normal EF, mild diastolic dysfunction.  Unfortunately patient did have a acute DVT following hip surgery in 2020.  Due to persistent intermittent chest discomfort, she was placed on amlodipine.  Repeat heart monitor in November 2022 demonstrated sinus bradycardia, sinus tachycardia, brief PAT and 9 beats run of nonsustained VT.  Patient was last seen by Edd Fabian, NP on 10/21/2021 at which time she was feeling well.  She was having fewer episode of palpitation.   Patient presents today for follow-up.  The frequency of her palpitation really has not changed.  She denies any chest pain but described a "quickening" related to her palpitation.  She has no lower extremity edema, orthopnea or PND.  On physical exam, her lung is clear.  Overall, she is stable from the cardiac perspective and can follow-up in 6 months.  Past Medical History:  Diagnosis Date   Bronchitis 09/2016   Bruises easily    DDD  (degenerative disc disease)    Degenerative joint disease of cervical spine 09-05-11   Cervival area, now some osteoarthritis-lower back and Rt. shoulder   Depression    Diverticulitis 09-05-11   hx. gastritis, diverticulitis x2 -now surgery planned   DIVERTICULITIS, ACUTE 02/10/2010   Dyspnea    with exertion   Essential hypertension 09-05-11   tx. Verapamil   GROIN PAIN 09/21/2010   Headache(784.0)    headaches are better   Hearing loss    right ear   Heart palpitations    Hemorrhoids    History of bronchitis    History of colonic polyps    History of pneumonia    History of tension headache    Hyperlipidemia    Hypertension    Hypothyroidism 09-05-11   Supplement used   Late effect of adverse effect of drug, medicinal or biological substance    NECK PAIN, ACUTE 09/21/2010   Neuromuscular disorder (HCC)    carpal tunnel in both hands   Nocturia    Osteoarthritis 112-17-12   spine and rt. hip, rt. shoulder   Patent foramen ovale    Small - unable to be closed -    Pneumonia    PONV (postoperative nausea and vomiting)    Sleep apnea 09-05-11   no cpap ever, had surgery to remove cartilage, no problems now   SORE THROAT 04/30/2009   Stroke (HCC) 09-05-11   2006/2009-(loss of memory, balance issues remains occ.)   TIA  09/28/2007, 10/2014   Unstable angina (HCC)    a. 05/2010 Cath: nl cors, EF 55%;  b. 05/2015 Lexiscan MV: small, severe, fixed apical defect and a small, severe, reversible inf lateral defect w/ apical thinning and mild ischemia, EF 54%.   URI 09/02/2008   Vertigo     Past Surgical History:  Procedure Laterality Date   ABDOMINAL HYSTERECTOMY     BACK SURGERY     CARDIAC CATHETERIZATION N/A 06/01/2015   Procedure: Left Heart Cath and Coronary Angiography;  Surgeon: Kathleene Hazel, MD;  Location: [facility] INVASIVE CV LAB;  Service: Cardiovascular;  Laterality: N/A;   COLON RESECTION  09/07/2011   Procedure: LAPAROSCOPIC SIGMOID COLON RESECTION;  Surgeon:  Mariella Saa, MD;  Location: [facility] ORS;  Service: General;  Laterality: N/A;  with proctoscopy   COLONOSCOPY  08/06/2015   per Dr. Kinnie Scales, adenomatous polyps, repeat in 5 yrs    CYST EXCISION N/A 11/29/2016   Procedure: EXCISION UMBILICAL CYST;  Surgeon: Glenna Fellows, MD;  Location: [facility] ORS;  Service: General;  Laterality: N/A;   ELBOW SURGERY  09-05-11   left elbow -ligament repair   EYE SURGERY     cataract   INSERTION OF MESH N/A 12/13/2013   Procedure: INSERTION OF MESH;  Surgeon: Mariella Saa, MD;  Location: [facility];  Service: General;  Laterality: N/A;   LAPAROSCOPY  09/07/2011   Procedure: LAPAROSCOPY DIAGNOSTIC;  Surgeon: Roselle Locus II;  Location: [facility];  Service: Gynecology;  Laterality: N/A;   MAXIMUM ACCESS (MAS)POSTERIOR LUMBAR INTERBODY FUSION (PLIF) 2 LEVEL N/A 05/03/2016   Procedure: L4-5 L5-S1 Maximum access posterior lumbar interbody fusion;  Surgeon: Maeola Harman, MD;  Location: [facility] NEURO ORS;  Service: Neurosurgery;  Laterality: N/A;  L4-5 L5-S1 Maximum access posterior lumbar interbody fusion   SALPINGOOPHORECTOMY  09/07/2011   Procedure: SALPINGO OOPHERECTOMY;  Surgeon: Roselle Locus II;  Location: [facility];  Service: Gynecology;  Laterality: Right;   THYROIDECTOMY, PARTIAL  09-05-11   TOTAL HIP ARTHROPLASTY Right 08/21/2018   Procedure: TOTAL HIP ARTHROPLASTY ANTERIOR APPROACH;  Surgeon: Marcene Corning, MD;  Location: [facility] OR;  Service: Orthopedics;  Laterality: Right;   TUBAL LIGATION  1983   UMBILICAL HERNIA REPAIR  yrs ago   VENTRAL HERNIA REPAIR  06/20/2012   Procedure: LAPAROSCOPIC VENTRAL HERNIA;  Surgeon: Mariella Saa, MD;  Location: [facility];  Service: General;  Laterality: N/A;  Laparoscopic Repair of Ventral Hernia with mesh   VENTRAL HERNIA REPAIR N/A 12/13/2013   Procedure: LAPAROSCOPIC REPAIR RECURRENT VENTRAL INCISIONAL  HERNIA;  Surgeon: Mariella Saa, MD;  Location: [facility];  Service: General;  Laterality: N/A;    Current  Medications: Current Meds  Medication Sig   atorvastatin (LIPITOR) 40 MG tablet Take 1 tablet (40 mg total) by mouth daily.   butalbital-acetaminophen-caffeine (FIORICET) 50-325-40 MG tablet TAKE 1 TABLET BY MOUTH EVERY 6 HOURS AS NEEDED FOR HEADACHE   clopidogrel (PLAVIX) 75 MG tablet TAKE 1 TABLET BY MOUTH EVERY DAY   levothyroxine (SYNTHROID) 125 MCG tablet TAKE 1 TABLET BY MOUTH EVERY DAY BEFORE BREAKFAST   losartan (COZAAR) 100 MG tablet TAKE 1 TABLET BY MOUTH EVERY DAY   traMADol (ULTRAM) 50 MG tablet TAKE 1 TO 2 TABLETS BY MOUTH EVERY 4 TO 6 HOURS AS NEEDED FOR PAIN   [DISCONTINUED] amLODipine (NORVASC) 10 MG tablet Take 1 tablet (10 mg total) by mouth daily.   [DISCONTINUED] metoprolol succinate (TOPROL-XL) 25 MG 24 hr tablet TAKE 1 TABLET BY MOUTH  EVERY DAY. (NEED APPT FOR REFILLS)     Allergies:   Dilaudid [hydromorphone hcl], Shellfish allergy, Aggrenox [aspirin-dipyridamole er], Codeine, Doxycycline monohydrate, Cephalexin, Codeine phosphate, Hydrocodone, Meperidine hcl, and Percocet [oxycodone-acetaminophen]   Social History   Socioeconomic History   Marital status: Married    Spouse name: Not on file   Number of children: Not on file   Years of education: Not on file   Highest education level: Not on file  Occupational History   Not on file  Tobacco Use   Smoking status: Former    Packs/day: 0.50    Years: 5.00    Total pack years: 2.50    Types: Cigarettes    Quit date: 09/19/1968    Years since quitting: 53.7   Smokeless tobacco: Never  Vaping Use   Vaping Use: Never used  Substance and Sexual Activity   Alcohol use: Yes    Alcohol/week: 2.0 standard drinks of alcohol    Types: 1 Cans of beer, 1 Shots of liquor per week    Comment: rarely   Drug use: No   Sexual activity: Yes  Other Topics Concern   Not on file  Social History Narrative   Lives in [place] with [place].  She does not routinely exercise.   Social Determinants of Health   Financial  Resource Strain: Not on file  Food Insecurity: Not on file  Transportation Needs: Not on file  Physical Activity: Not on file  Stress: Not on file  Social Connections: Not on file     Family History: The patient's family history includes Arrhythmia in her mother; Breast cancer in her sister and sister; Diabetes in her paternal grandmother; Hypertension in her mother; Stroke in her maternal grandmother and mother.  ROS:   Please see the history of present illness.     All other systems reviewed and are negative.  EKGs/Labs/Other Studies Reviewed:    The following studies were reviewed today:  Echo 12/07/2017 LV EF: 65% -   70%   -------------------------------------------------------------------  Indications:      Precordial Pain (R07.2).   -------------------------------------------------------------------  History:   PMH:  small PFO on TEE, hyperlipidemia, Palpitations.  Transient ischemic attack.  Risk factors:  Hypertension.   -------------------------------------------------------------------  Study Conclusions   - Left ventricle: The cavity size was normal. There was mild focal    basal hypertrophy of the septum. Systolic function was vigorous.    The estimated ejection fraction was in the range of 65% to 70%.    Wall motion was normal; there were no regional wall motion    abnormalities. Doppler parameters are consistent with abnormal    left ventricular relaxation (grade 1 diastolic dysfunction).  - Aortic valve: Trileaflet; mildly thickened, mildly calcified    leaflets.   EKG:  EKG is not ordered today.   Recent Labs: 08/20/2021: ALT 13 11/17/2021: TSH 2.27  Recent Lipid Panel    Component Value Date/Time   CHOL 184 09/29/2021 1007   TRIG 86 09/29/2021 1007   HDL 76 09/29/2021 1007   CHOLHDL 2.4 09/29/2021 1007   CHOLHDL 2.2 04/01/2020 1402   VLDL 17.6 05/15/2018 1528   LDLCALC 92 09/29/2021 1007   LDLCALC 74 04/01/2020 1402     Risk  Assessment/Calculations:           Physical Exam:    VS:  BP 128/74   Pulse 60   Ht 5' 5.5" (1.664 m)   Wt 202 lb 6.4 oz (91.8 kg)  LMP  (LMP Unknown)   SpO2 97%   BMI 33.17 kg/m        Wt Readings from Last 3 Encounters:  06/14/22 202 lb 6.4 oz (91.8 kg)  06/01/22 206 lb (93.4 kg)  02/21/22 198 lb (89.8 kg)     GEN:  Well nourished, well developed in no acute distress HEENT: Normal NECK: No JVD; No carotid bruits LYMPHATICS: No lymphadenopathy CARDIAC: RRR, no murmurs, rubs, gallops RESPIRATORY:  Clear to auscultation without rales, wheezing or rhonchi  ABDOMEN: Soft, non-tender, non-distended MUSCULOSKELETAL:  No edema; No deformity  SKIN: Warm and dry NEUROLOGIC:  Alert and oriented x 3 PSYCHIATRIC:  Normal affect   ASSESSMENT:    1. Palpitations   2. Essential hypertension   3. Hyperlipidemia LDL goal <100   4. H/O: CVA (cerebrovascular accident)    PLAN:    In order of problems listed above:  Palpitation: Her palpitation seems to be fairly stable at this time.  She used to word "quickening" to describe palpitation.  She says it is not a pain but more like a fluttering sensation.  Hypertension: Blood pressure stable  Hyperlipidemia: On Lipitor  History of CVA: On Plavix.           Medication Adjustments/Labs and Tests Ordered: Current medicines are reviewed at length with the patient today.  Concerns regarding medicines are outlined above.  No orders of the defined types were placed in this encounter.  Meds ordered this encounter  Medications   amLODipine (NORVASC) 10 MG tablet    Sig: Take 1 tablet (10 mg total) by mouth daily.    Dispense:  90 tablet    Refill:  3   metoprolol succinate (TOPROL-XL) 25 MG 24 hr tablet    Sig: TAKE 1 TABLET BY MOUTH EVERY DAY. (NEED APPT FOR REFILLS)    Dispense:  90 tablet    Refill:  3    Patient Instructions  Medication Instructions:  Your physician recommends that you continue on your current  medications as directed. Please refer to the Current Medication list given to you today.  *If you need a refill on your cardiac medications before your next appointment, please call your pharmacy*   Lab Work: NONE ordered at this time of appointment   If you have labs (blood work) drawn today and your tests are completely normal, you will receive your results only by: [place] (if you have MyChart) OR A paper copy in the mail If you have any lab test that is abnormal or we need to change your treatment, we will call you to review the results.   Testing/Procedures: NONE ordered at this time of appointment     Follow-Up: At [facility], you and your health needs are our priority.  As part of our continuing mission to provide you with exceptional heart care, we have created designated Provider Care Teams.  These Care Teams include your primary Cardiologist (physician) and Advanced Practice Providers (APPs -  Physician Assistants and Nurse Practitioners) who all work together to provide you with the care you need, when you need it.  We recommend signing up for the patient portal called "MyChart".  Sign up information is provided on this After Visit Summary.  MyChart is used to connect with patients for Virtual Visits (Telemedicine).  Patients are able to view lab/test results, encounter notes, upcoming appointments, etc.  Non-urgent messages can be sent to your provider as well.   To learn more about what you can  do with MyChart, go to [email or web address].    Your next appointment:   6 month(s)  The format for your next appointment:   In Person  Provider:   Edd Fabian, FNP or [name], MD    Signed, Azalee Course, [place]  06/16/2022 11:30 PM    [facility]

## 2022-06-14 NOTE — Patient Instructions (Signed)
Medication Instructions:  Your physician recommends that you continue on your current medications as directed. Please refer to the Current Medication list given to you today.  *If you need a refill on your cardiac medications before your next appointment, please call your pharmacy*   Lab Work: NONE ordered at this time of appointment   If you have labs (blood work) drawn today and your tests are completely normal, you will receive your results only by: [place] (if you have MyChart) OR A paper copy in the mail If you have any lab test that is abnormal or we need to change your treatment, we will call you to review the results.   Testing/Procedures: NONE ordered at this time of appointment     Follow-Up: At [facility], you and your health needs are our priority.  As part of our continuing mission to provide you with exceptional heart care, we have created designated Provider Care Teams.  These Care Teams include your primary Cardiologist (physician) and Advanced Practice Providers (APPs -  Physician Assistants and Nurse Practitioners) who all work together to provide you with the care you need, when you need it.  We recommend signing up for the patient portal called "MyChart".  Sign up information is provided on this After Visit Summary.  MyChart is used to connect with patients for Virtual Visits (Telemedicine).  Patients are able to view lab/test results, encounter notes, upcoming appointments, etc.  Non-urgent messages can be sent to your provider as well.   To learn more about what you can do with MyChart, go to [email or web address].    Your next appointment:   6 month(s)  The format for your next appointment:   In Person  Provider:   Coletta Memos, FNP or Kirk Ruths, MD

## 2022-06-16 ENCOUNTER — Encounter: Payer: Self-pay | Admitting: Physician Assistant

## 2022-06-24 ENCOUNTER — Other Ambulatory Visit: Payer: Self-pay | Admitting: Family Medicine

## 2022-07-06 DIAGNOSIS — M25552 Pain in left hip: Secondary | ICD-10-CM | POA: Diagnosis not present

## 2022-07-06 DIAGNOSIS — M5451 Vertebrogenic low back pain: Secondary | ICD-10-CM | POA: Diagnosis not present

## 2022-08-01 ENCOUNTER — Ambulatory Visit: Payer: Medicare PPO | Admitting: Family Medicine

## 2022-08-01 ENCOUNTER — Encounter: Payer: Self-pay | Admitting: Family Medicine

## 2022-08-01 VITALS — BP 128/80 | HR 67 | Temp 99.4°F | Wt 206.0 lb

## 2022-08-01 DIAGNOSIS — J020 Streptococcal pharyngitis: Secondary | ICD-10-CM | POA: Diagnosis not present

## 2022-08-01 DIAGNOSIS — J029 Acute pharyngitis, unspecified: Secondary | ICD-10-CM

## 2022-08-01 LAB — POC COVID19 BINAXNOW: SARS Coronavirus 2 Ag: NEGATIVE

## 2022-08-01 LAB — POCT RAPID STREP A (OFFICE): Rapid Strep A Screen: POSITIVE — AB

## 2022-08-01 MED ORDER — BENZONATATE 200 MG PO CAPS: 200.0000 mg | ORAL_CAPSULE | Freq: Four times a day (QID) | ORAL | 0 refills | Status: DC | PRN

## 2022-08-01 MED ORDER — AMOXICILLIN-POT CLAVULANATE 875-125 MG PO TABS: 1.0000 | ORAL_TABLET | Freq: Two times a day (BID) | ORAL | 0 refills | Status: DC

## 2022-08-01 NOTE — Progress Notes (Signed)
   Subjective:    Patient ID: Abigail [name], female    DOB: 1946-01-28, 76 y.o.   MRN: [id number]  HPI Here for 3 days of ST, fever, and a dry cough. Drinking fluids. No NVD.    Review of Systems  Constitutional:  Positive for fever.  HENT:  Positive for congestion, postnasal drip and sore throat. Negative for ear pain and sinus pressure.   Eyes: Negative.   Respiratory:  Positive for cough. Negative for shortness of breath and wheezing.        Objective:   Physical Exam Constitutional:      Appearance: Normal appearance.  HENT:     Right Ear: Tympanic membrane, ear canal and external ear normal.     Left Ear: Tympanic membrane, ear canal and external ear normal.     Nose: Nose normal.     Mouth/Throat:     Pharynx: Oropharyngeal exudate and posterior oropharyngeal erythema present.  Eyes:     Conjunctiva/sclera: Conjunctivae normal.  Neck:     Comments: Tender swollen AC nodes  Pulmonary:     Effort: Pulmonary effort is normal.     Breath sounds: Normal breath sounds.  Neurological:     Mental Status: She is alert.           Assessment & Plan:  Strep pharyngitis, treat with 10 days of Augmentin.  Gershon Crane, MD

## 2022-08-08 DIAGNOSIS — M25521 Pain in right elbow: Secondary | ICD-10-CM | POA: Diagnosis not present

## 2022-08-16 ENCOUNTER — Ambulatory Visit: Payer: Medicare PPO | Admitting: Family Medicine

## 2022-08-16 ENCOUNTER — Encounter: Payer: Self-pay | Admitting: Family Medicine

## 2022-08-16 VITALS — BP 130/78 | HR 56 | Temp 98.4°F | Wt 205.0 lb

## 2022-08-16 DIAGNOSIS — J40 Bronchitis, not specified as acute or chronic: Secondary | ICD-10-CM

## 2022-08-16 MED ORDER — METHYLPREDNISOLONE 4 MG PO TBPK: ORAL_TABLET | ORAL | 0 refills | Status: DC

## 2022-08-16 MED ORDER — AZITHROMYCIN 250 MG PO TABS: ORAL_TABLET | ORAL | 0 refills | Status: DC

## 2022-08-16 MED ORDER — ALBUTEROL SULFATE HFA 108 (90 BASE) MCG/ACT IN AERS: 2.0000 | INHALATION_SPRAY | RESPIRATORY_TRACT | 1 refills | Status: DC | PRN

## 2022-08-16 NOTE — Progress Notes (Signed)
   Subjective:    Patient ID: Abigail [name], female    DOB: 30-Oct-1945, 76 y.o.   MRN: [id number]  HPI Here for 2 days of chest congestion, coughing up green sputum ,and mild SOB. No fever or chest pain. We saw her on 08-01-22 for a strep throat, and she took 10 days of Augmentin for that. She seemed to be over it, and her throat was no longer sore. Now her symptoms are very different. She is using Benzonatate for the cough, but these do not help much.    Review of Systems  Constitutional: Negative.   HENT: Negative.    Eyes: Negative.   Respiratory:  Positive for cough, shortness of breath and wheezing.   Cardiovascular: Negative.        Objective:   Physical Exam Constitutional:      Appearance: Normal appearance. She is not ill-appearing.     Comments: She coughs occasionally   HENT:     Right Ear: Tympanic membrane, ear canal and external ear normal.     Left Ear: Tympanic membrane, ear canal and external ear normal.     Nose: Nose normal.     Mouth/Throat:     Pharynx: Oropharynx is clear. No oropharyngeal exudate or posterior oropharyngeal erythema.  Eyes:     Conjunctiva/sclera: Conjunctivae normal.  Cardiovascular:     Rate and Rhythm: Normal rate and regular rhythm.     Pulses: Normal pulses.     Heart sounds: Normal heart sounds.  Pulmonary:     Effort: Pulmonary effort is normal.     Breath sounds: Wheezing and rhonchi present. No rales.  Lymphadenopathy:     Cervical: No cervical adenopathy.  Neurological:     Mental Status: She is alert.           Assessment & Plan:  Her strep pharyngitis has resolved, but now she has a bronchitis. Treat this with a Zpack and a Medrol dose pack. She can use the albuterol inhaler as needed.  Gershon Crane, MD

## 2022-08-22 DIAGNOSIS — M25521 Pain in right elbow: Secondary | ICD-10-CM | POA: Diagnosis not present

## 2022-08-24 DIAGNOSIS — N760 Acute vaginitis: Secondary | ICD-10-CM | POA: Diagnosis not present

## 2022-08-26 ENCOUNTER — Telehealth: Payer: Self-pay | Admitting: Cardiology

## 2022-08-26 NOTE — Telephone Encounter (Signed)
Pt c/o Shortness Of Breath: STAT if SOB developed within the last 24 hours or pt is noticeably SOB on the phone  1. Are you currently SOB (can you hear that pt is SOB on the phone)? Yes  2. How long have you been experiencing SOB?  1 month - but getting more frequent  3. Are you SOB when sitting or when up moving around? Both  4. Are you currently experiencing any other symptoms? Sweating, heaviness in chest, feeling she's going to pass out  Call transferred

## 2022-08-26 NOTE — Telephone Encounter (Signed)
Breaking out sweating, heaviness & pounding in chest, feeling she's going to pass out this has happened everyday. Pt states that this resolves with rest. Pt will not go to ER. Appt scheduled 12-12 @ 130pm. Informed once again that our procedure is to go directly to the ER, she states that she will not go.

## 2022-08-29 ENCOUNTER — Telehealth: Payer: Self-pay

## 2022-08-29 DIAGNOSIS — M25521 Pain in right elbow: Secondary | ICD-10-CM | POA: Diagnosis not present

## 2022-08-29 NOTE — Telephone Encounter (Signed)
Sherlean Foot asked for patient to be moved from 1:30 pm to 10:55 am tomorrow. Patient agreed.

## 2022-08-29 NOTE — Progress Notes (Signed)
Cardiology Clinic Note   Patient Name: LINDIA GARMS Date of Encounter: 08/30/2022  Primary Care Provider:  Nelwyn Salisbury, MD Primary Cardiologist:  [name], MD  Patient Profile    Abigail [name] is a 76 y.o. female with a past medical history of nonobstructive CAD, palpitations, hypertension, hyperlipidemia, presyncope, DVT, CVA who presents to the clinic today for evaluation of chest pain.   Past Medical History    Past Medical History:  Diagnosis Date   Bronchitis 09/2016   Bruises easily    DDD (degenerative disc disease)    Degenerative joint disease of cervical spine 09-05-11   Cervival area, now some osteoarthritis-lower back and Rt. shoulder   Depression    Diverticulitis 09-05-11   hx. gastritis, diverticulitis x2 -now surgery planned   DIVERTICULITIS, ACUTE 02/10/2010   Dyspnea    with exertion   Essential hypertension 09-05-11   tx. Verapamil   GROIN PAIN 09/21/2010   Headache(784.0)    headaches are better   Hearing loss    right ear   Heart palpitations    Hemorrhoids    History of bronchitis    History of colonic polyps    History of pneumonia    History of tension headache    Hyperlipidemia    Hypertension    Hypothyroidism 09-05-11   Supplement used   Late effect of adverse effect of drug, medicinal or biological substance    NECK PAIN, ACUTE 09/21/2010   Neuromuscular disorder (HCC)    carpal tunnel in both hands   Nocturia    Osteoarthritis 112-17-12   spine and rt. hip, rt. shoulder   Patent foramen ovale    Small - unable to be closed -    Pneumonia    PONV (postoperative nausea and vomiting)    Sleep apnea 09-05-11   no cpap ever, had surgery to remove cartilage, no problems now   SORE THROAT 04/30/2009   Stroke (HCC) 09-05-11   2006/2009-(loss of memory, balance issues remains occ.)   TIA 09/28/2007, 10/2014   Unstable angina (HCC)    a. 05/2010 Cath: nl cors, EF 55%;  b. 05/2015 Lexiscan MV: small, severe, fixed apical defect and a  small, severe, reversible inf lateral defect w/ apical thinning and mild ischemia, EF 54%.   URI 09/02/2008   Vertigo    Past Surgical History:  Procedure Laterality Date   ABDOMINAL HYSTERECTOMY     BACK SURGERY     CARDIAC CATHETERIZATION N/A 06/01/2015   Procedure: Left Heart Cath and Coronary Angiography;  Surgeon: Kathleene Hazel, MD;  Location: [facility] INVASIVE CV LAB;  Service: Cardiovascular;  Laterality: N/A;   COLON RESECTION  09/07/2011   Procedure: LAPAROSCOPIC SIGMOID COLON RESECTION;  Surgeon: Mariella Saa, MD;  Location: [facility] ORS;  Service: General;  Laterality: N/A;  with proctoscopy   COLONOSCOPY  08/06/2015   per Dr. Kinnie Scales, adenomatous polyps, repeat in 5 yrs    CYST EXCISION N/A 11/29/2016   Procedure: EXCISION UMBILICAL CYST;  Surgeon: Glenna Fellows, MD;  Location: [facility] ORS;  Service: General;  Laterality: N/A;   ELBOW SURGERY  09-05-11   left elbow -ligament repair   EYE SURGERY     cataract   INSERTION OF MESH N/A 12/13/2013   Procedure: INSERTION OF MESH;  Surgeon: Mariella Saa, MD;  Location: [facility];  Service: General;  Laterality: N/A;   LAPAROSCOPY  09/07/2011   Procedure: LAPAROSCOPY DIAGNOSTIC;  Surgeon: Roselle Locus II;  Location: [facility]  ORS;  Service: Gynecology;  Laterality: N/A;   MAXIMUM ACCESS (MAS)POSTERIOR LUMBAR INTERBODY FUSION (PLIF) 2 LEVEL N/A 05/03/2016   Procedure: L4-5 L5-S1 Maximum access posterior lumbar interbody fusion;  Surgeon: Maeola HarmanJoseph Stern, MD;  Location: [facility] NEURO ORS;  Service: Neurosurgery;  Laterality: N/A;  L4-5 L5-S1 Maximum access posterior lumbar interbody fusion   SALPINGOOPHORECTOMY  09/07/2011   Procedure: SALPINGO OOPHERECTOMY;  Surgeon: Roselle LocusJames E Tomblin II;  Location: [facility];  Service: Gynecology;  Laterality: Right;   THYROIDECTOMY, PARTIAL  09-05-11   TOTAL HIP ARTHROPLASTY Right 08/21/2018   Procedure: TOTAL HIP ARTHROPLASTY ANTERIOR APPROACH;  Surgeon: Marcene Corningalldorf, Peter, MD;  Location: [facility] OR;  Service:  Orthopedics;  Laterality: Right;   TUBAL LIGATION  1983   UMBILICAL HERNIA REPAIR  yrs ago   VENTRAL HERNIA REPAIR  06/20/2012   Procedure: LAPAROSCOPIC VENTRAL HERNIA;  Surgeon: Mariella SaaBenjamin T Hoxworth, MD;  Location: [facility];  Service: General;  Laterality: N/A;  Laparoscopic Repair of Ventral Hernia with mesh   VENTRAL HERNIA REPAIR N/A 12/13/2013   Procedure: LAPAROSCOPIC REPAIR RECURRENT VENTRAL INCISIONAL  HERNIA;  Surgeon: Mariella SaaBenjamin T Hoxworth, MD;  Location: [facility] ORS;  Service: General;  Laterality: N/A;    Allergies  Allergies  Allergen Reactions   Dilaudid [Hydromorphone Hcl] Shortness Of Breath, Nausea And Vomiting and Other (See Comments)    Able to take morphine without issue   Shellfish Allergy Shortness Of Breath and Rash    Only shrimp allergy   Aggrenox [Aspirin-Dipyridamole Er] Nausea And Vomiting and Other (See Comments)    Headache    Codeine Nausea And Vomiting   Doxycycline Monohydrate Diarrhea   Cephalexin Itching and Rash   Codeine Phosphate Nausea And Vomiting   Hydrocodone Nausea And Vomiting and Other (See Comments)    Can take morphine without issue   Meperidine Hcl Nausea And Vomiting   Percocet [Oxycodone-Acetaminophen] Nausea And Vomiting and Other (See Comments)    Can take morphine without issue    History of Present Illness    Enid DerryLynn G Nida has a past medical history of: Nonobstructive CAD.  LHC 06/01/2015: Proximal RCA 10%, proximal Cx 10%, proximal to distal LAD 10%.  Echo 02/24/2017: EF 60-65%. Moderate focal basal and mid concentric LVH. Mildly calcified aortic valve leaflets. Increased thickness of the atrial septum consistent with lipomatous hypertrophy. Echo 12/07/2017: EF 65-70%. Mild focal basal hypertrophy of the left ventricular septum. Grade I DD. Mildly thickened/calcified aortic valve leaflets.  Palpitations.  Cardiac telemetry monitor 06/10/2019: Sinus bradycardia, NSR, PVCs, 4 and 5 beats of NSVT. Treated with metoprolol.  14 day Zio  09/09/2021: Predominant underlying rhythm was sinus rhythm. 1 run of VT lasting 9 beats with max rate 139. 2 SVT runs fastest/longest 19 beats max rate 129. Rare PVCs and PACs.  Hypertension.  Hyperlipidemia.  Lipid panel 09/29/2021: LDL 92, HDL 76, TG 86, total 184.  Presyncope.  DVT.  Lower extremity US 09/05/2018: Acute DVT right femoral vein distal segment above the knee with partially re-canalized right posterior tibial vein and peroneal vein. Started on Xarelto.  Lower extremity US 03/05/2019: Continued DVT right peroneal vein. There is flow seen in the mid calf of the peroneal vein which was not seen previously. Acute clots resolved. Stop Xarelto and begin Plavix.  Lower extremity US 12/12/2019: Chronic DVT right peroneal vein.  CVA.    [name] is a patient of Dr. Jens Somrenshaw. She was last seen in the office for follow-up by Azalee CourseMeng Hao, PA-C. At that time she was doing  well. Palpitations were unchanged. No medication changes were made.   Patient contacted the office on 08/26/2022 with complaints of chest heaviness, feeling like she was going to pass out, sweating, and shortness of breath that resolves with rest. She was encouraged to present to the ED but declined.   Today, patient is alone. She reports she has not had any more episodes of chest pain. She describes the episode last week as the usual onset of palpitations but this episode was accompanied by chest heaviness behind left breast, diaphoresis and shortness of breath that lasted into the next day. She took Anacin, which helped her pain somewhat. Leaning forward slightly made the shortness of breath better. She denies nausea or vomiting with episode of pain. Palpitations occur daily but are very brief episodes of throbbing behind left breast that resolves on its own. Patient does not follow a dedicated exercise routine secondary to chronic pain in her back and right hip. She does not report DOE or chest pain with walking in the grocery store.  No lower extremity edema, orthopnea, or PND. She will have occasional bilateral ankle puffiness.     Home Medications    Current Meds  Medication Sig   amLODipine (NORVASC) 10 MG tablet Take 1 tablet (10 mg total) by mouth daily.   atorvastatin (LIPITOR) 40 MG tablet TAKE 1 TABLET BY MOUTH EVERY DAY   butalbital-acetaminophen-caffeine (FIORICET) 50-325-40 MG tablet TAKE 1 TABLET BY MOUTH EVERY 6 HOURS AS NEEDED FOR HEADACHE   clopidogrel (PLAVIX) 75 MG tablet TAKE 1 TABLET BY MOUTH EVERY DAY   levothyroxine (SYNTHROID) 125 MCG tablet TAKE 1 TABLET BY MOUTH EVERY DAY BEFORE BREAKFAST   losartan (COZAAR) 100 MG tablet TAKE 1 TABLET BY MOUTH EVERY DAY   metoprolol succinate (TOPROL-XL) 25 MG 24 hr tablet TAKE 1 TABLET BY MOUTH EVERY DAY. (NEED APPT FOR REFILLS)   traMADol (ULTRAM) 50 MG tablet TAKE 1 TO 2 TABLETS BY MOUTH EVERY 4 TO 6 HOURS AS NEEDED FOR PAIN    Family History    Family History  Problem Relation Age of Onset   Arrhythmia Mother    Hypertension Mother    Stroke Mother    Stroke Maternal Grandmother    Diabetes Paternal Grandmother    Breast cancer Sister        89s   Breast cancer Sister        81s   She indicated that her mother is deceased. She indicated that her father is deceased. She indicated that her maternal grandmother is deceased. She indicated that her maternal grandfather is deceased. She indicated that her paternal grandmother is deceased. She indicated that her paternal grandfather is deceased.   Social History    Social History   Socioeconomic History   Marital status: Married    Spouse name: Not on file   Number of children: Not on file   Years of education: Not on file   Highest education level: Not on file  Occupational History   Not on file  Tobacco Use   Smoking status: Former    Packs/day: 0.50    Years: 5.00    Total pack years: 2.50    Types: Cigarettes    Quit date: 09/19/1968    Years since quitting: 53.9   Smokeless tobacco:  Never  Vaping Use   Vaping Use: Never used  Substance and Sexual Activity   Alcohol use: Yes    Alcohol/week: 2.0 standard drinks of alcohol    Types: 1  Cans of beer, 1 Shots of liquor per week    Comment: rarely   Drug use: No   Sexual activity: Yes  Other Topics Concern   Not on file  Social History Narrative   Lives in [place] with her husband.  She does not routinely exercise.   Social Determinants of Health   Financial Resource Strain: Not on file  Food Insecurity: Not on file  Transportation Needs: Not on file  Physical Activity: Not on file  Stress: Not on file  Social Connections: Not on file  Intimate Partner Violence: Not on file     Review of Systems    General:  No chills, fever, night sweats or weight changes.  Cardiovascular:  No chest pain, dyspnea on exertion, edema, orthopnea, palpitations, paroxysmal nocturnal dyspnea. Dermatological: No rash, lesions/masses Respiratory: No cough, dyspnea Urologic: No hematuria, dysuria Abdominal:   No nausea, vomiting, diarrhea, bright red blood per rectum, melena, or hematemesis Neurologic:  No visual changes, weakness, changes in mental status. All other systems reviewed and are otherwise negative except as noted above.  Physical Exam    VS:  BP 132/70   Pulse (!) 59   Ht 5' 5.5" (1.664 m)   Wt 207 lb 12.8 oz (94.3 kg)   LMP  (LMP Unknown)   SpO2 97%   BMI 34.05 kg/m  , BMI Body mass index is 34.05 kg/m. GEN:  Well nourished, well developed, in no acute distress. HEENT: Normal. Neck: Supple, no JVD, carotid bruits, or masses. Cardiac: RRR, no murmurs, rubs, or gallops. No clubbing, cyanosis, edema.  Radials/DP/PT 2+ and equal bilaterally.  Respiratory:  Respirations regular and unlabored, clear to auscultation bilaterally. GI: Soft, nontender, nondistended. MS: No deformity or atrophy. Skin: Warm and dry, no rash. Neuro: Strength and sensation are intact. Psych: Normal affect.  Accessory Clinical Findings    Recent Labs: 11/17/2021: TSH 2.27   Recent Lipid Panel    Component Value Date/Time   CHOL 184 09/29/2021 1007   TRIG 86 09/29/2021 1007   HDL 76 09/29/2021 1007   CHOLHDL 2.4 09/29/2021 1007   CHOLHDL 2.2 04/01/2020 1402   VLDL 17.6 05/15/2018 1528   LDLCALC 92 09/29/2021 1007   LDLCALC 74 04/01/2020 1402         ECG personally reviewed by me today: Sinus bradycardia, HR 59.  No significant changes from 08/18/2021      Assessment & Plan    Chest pain/nonobstructive CAD. LHC September 2016 showed nonobstructive CAD. Patient complains of one episode of chest pain that lasted several hours and is described as heaviness with associated shortness of breath and diaphoresis. Pain was relieved somewhat with Anacin.  Will obtain a coronary CT. She has an allergy to shellfish. Will follow premedication protocol. Instructions gone over in detail with patient. Continue plavix, atorvastatin, and metoprolol.  Palpitations. 14 day Zio December 2022 showed 1 run of VT lasting 9 beats, 2 runs of SVT lasting 19 beats, and rare PVCs and PACs. Patient reports very brief episodes of palpitations described as throbbing behind left breast. She feels these are improved with metoprolol and are typically not too bothersome to her. Given these are unchanged from previous, will not repeat Zio monitor at this time. Continue metoprolol.  Hypertension. BP today 132/70. She denies headaches or dizziness. Continue metoprolol, losartan, and amlodipine.       Disposition: BMP today. Coronary CT for precordial chest pain. Return in 6-8 weeks after testing complete.    Etta Grandchild. Deidra Spease, NP-C  08/30/2022, 10:40 AM [facility] [address] Office [phone] Fax [phone]   I spent 15 minutes examining this patient, reviewing medications, and using patient centered shared decision making involving her cardiac care.  Prior to her visit I spent greater than  20 minutes reviewing her past medical history,  medications, and prior cardiac tests.

## 2022-08-30 ENCOUNTER — Ambulatory Visit: Payer: Medicare PPO | Admitting: General Practice

## 2022-08-30 ENCOUNTER — Encounter: Payer: Self-pay | Admitting: Student

## 2022-08-30 ENCOUNTER — Ambulatory Visit: Payer: Medicare PPO | Attending: General Practice | Admitting: Student

## 2022-08-30 VITALS — BP 132/70 | HR 59 | Ht 65.5 in | Wt 207.8 lb

## 2022-08-30 DIAGNOSIS — R072 Precordial pain: Secondary | ICD-10-CM

## 2022-08-30 DIAGNOSIS — R002 Palpitations: Secondary | ICD-10-CM | POA: Diagnosis not present

## 2022-08-30 DIAGNOSIS — I10 Essential (primary) hypertension: Secondary | ICD-10-CM

## 2022-08-30 MED ORDER — PREDNISONE 50 MG PO TABS: ORAL_TABLET | ORAL | 0 refills | Status: DC

## 2022-08-30 MED ORDER — METOPROLOL TARTRATE 25 MG PO TABS: 25.0000 mg | ORAL_TABLET | Freq: Once | ORAL | 0 refills | Status: DC

## 2022-08-30 NOTE — Patient Instructions (Addendum)
Medication Instructions:  Metoprolol tartrate 25 mg PO 90-120 min prior to scan   TAKE THE PRE-MED FOR YOUR SHELLFISH ALLERGY SEE BELOW *If you need a refill on your cardiac medications before your next appointment, please call your pharmacy*   Lab Work: BMET TODAY If you have labs (blood work) drawn today and your tests are completely normal, you will receive your results only by: MyChart Message (if you have MyChart) OR A paper copy in the mail If you have any lab test that is abnormal or we need to change your treatment, we will call you to review the results.   Testing/Procedures: SEE CT BE,LOW   Follow-Up: At [facility], you and your health needs are our priority.  As part of our continuing mission to provide you with exceptional heart care, we have created designated Provider Care Teams.  These Care Teams include your primary Cardiologist (physician) and Advanced Practice Providers (APPs -  Physician Assistants and Nurse Practitioners) who all work together to provide you with the care you need, when you need it.  Your next appointment:   AFTER TESTING   The format for your next appointment:   In Person  Provider:   Carlos Levering       Other Instructions  Important Information About Sugar        Your cardiac CT will be scheduled at one of the below locations:   [facility] [address] [place], [place] [address] [phone]  OR  [facility] [address] [place], [place] [address] [phone]  OR   [facility] [address] [place], [place] [address] [phone]  If scheduled at [facility], please arrive at the [facility] and Children's Entrance (Entrance C2) of [facility] 30 minutes prior to test start time. You can use the FREE valet parking offered at entrance C (encouraged to control the heart rate for the test)  Proceed to  the [facility] Radiology Department (first floor) to check-in and test prep.  All radiology patients and guests should use entrance C2 at [facility], accessed from [facility], even though the hospital's physical address listed is [address].    If scheduled at [facility] or [facility], please arrive 15 mins early for check-in and test prep.   Please follow these instructions carefully (unless otherwise directed):  Hold all erectile dysfunction medications at least 3 days (72 hrs) prior to test. (Ie viagra, cialis, sildenafil, tadalafil, etc) We will administer nitroglycerin during this exam.   On the Night Before the Test: Be sure to Drink plenty of water. Do not consume any caffeinated/decaffeinated beverages or chocolate 12 hours prior to your test. Do not take any antihistamines 12 hours prior to your test. If the patient has contrast allergy: Patient will need a prescription for Prednisone and very clear instructions (as follows): Prednisone 50 mg - take 13 hours prior to test Take another Prednisone 50 mg 7 hours prior to test Take another Prednisone 50 mg 1 hour prior to test Take Benadryl 50 mg 1 hour prior to test Patient must complete all four doses of above prophylactic medications. Patient will need a ride after test due to Benadryl.  On the Day of the Test: Drink plenty of water until 1 hour prior to the test. PLEASE ARRIVE 1 HOUR PRIOR TO TEST FOR PRE-HYDRATION IV Do not eat any food  1 hour prior to test. You may take your regular medications prior to the test.  Take metoprolol (Lopressor) two hours prior to test. HOLD Furosemide/Hydrochlorothiazide morning of the test. FEMALES- please wear underwire-free bra if available, avoid dresses & tight clothing     After the Test: Drink plenty of water. After receiving IV contrast, you may experience a mild flushed feeling. This is normal. On  occasion, you may experience a mild rash up to 24 hours after the test. This is not dangerous. If this occurs, you can take Benadryl 25 mg and increase your fluid intake. If you experience trouble breathing, this can be serious. If it is severe call 911 IMMEDIATELY. If it is mild, please call our office. If you take any of these medications: Glipizide/Metformin, Avandament, Glucavance, please do not take 48 hours after completing test unless otherwise instructed.  We will call to schedule your test 2-4 weeks out understanding that some insurance companies will need an authorization prior to the service being performed.   For non-scheduling related questions, please contact the cardiac imaging nurse navigator should you have any questions/concerns: Marchia Bond, Cardiac Imaging Nurse Navigator Gordy Clement, Cardiac Imaging Nurse Navigator [facility] Direct Office Dial: [phone]   For scheduling needs, including cancellations and rescheduling, please call [place], [phone].

## 2022-08-31 LAB — BASIC METABOLIC PANEL
BUN/Creatinine Ratio: 14 (ref 12–28)
BUN: 12 mg/dL (ref 8–27)
CO2: 21 mmol/L (ref 20–29)
Calcium: 9.5 mg/dL (ref 8.7–10.3)
Chloride: 105 mmol/L (ref 96–106)
Creatinine, Ser: 0.88 mg/dL (ref 0.57–1.00)
Glucose: 111 mg/dL — ABNORMAL HIGH (ref 70–99)
Potassium: 3.7 mmol/L (ref 3.5–5.2)
Sodium: 143 mmol/L (ref 134–144)
eGFR: 68 mL/min/{1.73_m2} (ref >59)

## 2022-09-27 ENCOUNTER — Telehealth (HOSPITAL_COMMUNITY): Payer: Self-pay | Admitting: *Deleted

## 2022-09-27 NOTE — Telephone Encounter (Signed)
Reaching out to patient to offer assistance regarding upcoming cardiac imaging study; pt verbalizes understanding of appt date/time, parking situation and where to check in, pre-test NPO status  and verified current allergies; name and call back number provided for further questions should they arise  Gordy Clement RN Navigator Cardiac Imaging [facility] [phone] office [phone] cell  Patient denies an allergic to IV dye. She only has nausea/vomiting to the MRI dye.  Advised that she did not need 13 hour prep.  She will take her daily medications and is aware to arrive at 10:30am.

## 2022-09-28 ENCOUNTER — Encounter (HOSPITAL_COMMUNITY): Payer: Self-pay

## 2022-09-28 ENCOUNTER — Ambulatory Visit (HOSPITAL_COMMUNITY)
Admission: RE | Admit: 2022-09-28 | Discharge: 2022-09-28 | Disposition: A | Discharge status: Home / Self Care | Payer: Medicare PPO | Source: Ambulatory Visit | Attending: Student | Admitting: Student

## 2022-09-28 DIAGNOSIS — R072 Precordial pain: Secondary | ICD-10-CM | POA: Diagnosis not present

## 2022-09-28 MED ORDER — NITROGLYCERIN 0.4 MG SL SUBL
SUBLINGUAL_TABLET | SUBLINGUAL | Status: AC
  Filled 2022-09-28: qty 2

## 2022-09-28 MED ORDER — IOHEXOL 350 MG/ML SOLN
95.0000 mL | Freq: Once | INTRAVENOUS | Status: AC | PRN
  Administered 2022-09-28: 95 mL via INTRAVENOUS

## 2022-09-28 MED ORDER — NITROGLYCERIN 0.4 MG SL SUBL
0.8000 mg | SUBLINGUAL_TABLET | Freq: Once | SUBLINGUAL | Status: AC
  Administered 2022-09-28: 0.8 mg via SUBLINGUAL

## 2022-10-07 DIAGNOSIS — M545 Low back pain, unspecified: Secondary | ICD-10-CM | POA: Diagnosis not present

## 2022-10-08 ENCOUNTER — Other Ambulatory Visit: Payer: Self-pay | Admitting: Cardiology

## 2022-10-08 DIAGNOSIS — I10 Essential (primary) hypertension: Secondary | ICD-10-CM

## 2022-10-19 DIAGNOSIS — M545 Low back pain, unspecified: Secondary | ICD-10-CM | POA: Diagnosis not present

## 2022-11-24 NOTE — Progress Notes (Signed)
HPI: FU palpitations. She has a long history of palpitations and presyncope. Patient apparently noted to have a PFO on transesophageal echocardiogram in 2006 following CVA. Cardiac catheterization September 2016 showed no obstructive coronary disease and normal LV function. Echocardiogram March 2019 showed normal LV function and mild diastolic dysfunction.  Carotid Dopplers July 2020 showed 1 to 39% bilateral stenosis.  Monitor December 2022 showed sinus rhythm with PACs, brief PAT, rare PVC and 9 beats nonsustained ventricular tachycardia.  Cardiac CTA January 2024 showed calcium score of 58 which was 77 percentile and mild stenosis in the second diagonal and right coronary artery.  Since last seen, she has dyspnea associated with palpitations at times.  She also notes some dyspnea on exertion but no orthopnea or PND.  She has not had exertional chest pain.  No syncope.  Current Outpatient Medications  Medication Sig Dispense Refill   amLODipine (NORVASC) 10 MG tablet Take 1 tablet (10 mg total) by mouth daily. 90 tablet 3   atorvastatin (LIPITOR) 40 MG tablet TAKE 1 TABLET BY MOUTH EVERY DAY 90 tablet 1   butalbital-acetaminophen-caffeine (FIORICET) 50-325-40 MG tablet TAKE 1 TABLET BY MOUTH EVERY 6 HOURS AS NEEDED FOR HEADACHE 60 tablet 5   clopidogrel (PLAVIX) 75 MG tablet Take 1 tablet (75 mg total) by mouth daily. *Labs required for future refills* 90 tablet 0   levothyroxine (SYNTHROID) 125 MCG tablet TAKE 1 TABLET BY MOUTH EVERY DAY BEFORE BREAKFAST 90 tablet 1   losartan (COZAAR) 100 MG tablet TAKE 1 TABLET BY MOUTH EVERY DAY 90 tablet 3   metoprolol succinate (TOPROL-XL) 25 MG 24 hr tablet TAKE 1 TABLET BY MOUTH EVERY DAY. (NEED APPT FOR REFILLS) 90 tablet 3   traMADol (ULTRAM) 50 MG tablet TAKE 1 TO 2 TABLETS BY MOUTH EVERY 4 TO 6 HOURS AS NEEDED FOR PAIN 120 tablet 5   No current facility-administered medications for this visit.     Past Medical History:  Diagnosis Date    Bronchitis 09/2016   Bruises easily    DDD (degenerative disc disease)    Degenerative joint disease of cervical spine 09-05-11   Cervival area, now some osteoarthritis-lower back and Rt. shoulder   Depression    Diverticulitis 09-05-11   hx. gastritis, diverticulitis x2 -now surgery planned   DIVERTICULITIS, ACUTE 02/10/2010   Dyspnea    with exertion   Essential hypertension 09-05-11   tx. Verapamil   GROIN PAIN 09/21/2010   Headache(784.0)    headaches are better   Hearing loss    right ear   Heart palpitations    Hemorrhoids    History of bronchitis    History of colonic polyps    History of pneumonia    History of tension headache    Hyperlipidemia    Hypertension    Hypothyroidism 09-05-11   Supplement used   Late effect of adverse effect of drug, medicinal or biological substance    NECK PAIN, ACUTE 09/21/2010   Neuromuscular disorder ([place])    carpal tunnel in both hands   Nocturia    Osteoarthritis 112-17-12   spine and rt. hip, rt. shoulder   Patent foramen ovale    Small - unable to be closed -    Pneumonia    PONV (postoperative nausea and vomiting)    Sleep apnea 09-05-11   no cpap ever, had surgery to remove cartilage, no problems now   SORE THROAT 04/30/2009   Stroke ([place]) 09-05-11   2006/2009-(loss of memory,  balance issues remains occ.)   TIA 09/28/2007, 10/2014   Unstable angina ([place])    a. 05/2010 Cath: nl cors, EF 55%;  b. 05/2015 Lexiscan MV: small, severe, fixed apical defect and a small, severe, reversible inf lateral defect w/ apical thinning and mild ischemia, EF 54%.   URI 09/02/2008   Vertigo     Past Surgical History:  Procedure Laterality Date   ABDOMINAL HYSTERECTOMY     BACK SURGERY     CARDIAC CATHETERIZATION N/A 06/01/2015   Procedure: Left Heart Cath and Coronary Angiography;  Surgeon: Burnell Blanks, MD;  Location: [facility];  Service: Cardiovascular;  Laterality: N/A;   COLON RESECTION  09/07/2011   Procedure:  LAPAROSCOPIC SIGMOID COLON RESECTION;  Surgeon: Edward Jolly, MD;  Location: [facility];  Service: General;  Laterality: N/A;  with proctoscopy   COLONOSCOPY  08/06/2015   per Dr. Earlean Shawl, adenomatous polyps, repeat in 5 yrs    CYST EXCISION N/A 11/29/2016   Procedure: EXCISION UMBILICAL CYST;  Surgeon: Excell Seltzer, MD;  Location: [facility];  Service: General;  Laterality: N/A;   ELBOW SURGERY  09-05-11   left elbow -ligament repair   EYE SURGERY     cataract   INSERTION OF MESH N/A 12/13/2013   Procedure: INSERTION OF MESH;  Surgeon: Edward Jolly, MD;  Location: [facility];  Service: General;  Laterality: N/A;   LAPAROSCOPY  09/07/2011   Procedure: LAPAROSCOPY DIAGNOSTIC;  Surgeon: Shon Millet II;  Location: [facility];  Service: Gynecology;  Laterality: N/A;   MAXIMUM ACCESS (MAS)POSTERIOR LUMBAR INTERBODY FUSION (PLIF) 2 LEVEL N/A 05/03/2016   Procedure: L4-5 L5-S1 Maximum access posterior lumbar interbody fusion;  Surgeon: Erline Levine, MD;  Location: [facility];  Service: Neurosurgery;  Laterality: N/A;  L4-5 L5-S1 Maximum access posterior lumbar interbody fusion   SALPINGOOPHORECTOMY  09/07/2011   Procedure: SALPINGO OOPHERECTOMY;  Surgeon: Shon Millet II;  Location: [facility];  Service: Gynecology;  Laterality: Right;   THYROIDECTOMY, PARTIAL  09-05-11   TOTAL HIP ARTHROPLASTY Right 08/21/2018   Procedure: TOTAL HIP ARTHROPLASTY ANTERIOR APPROACH;  Surgeon: Melrose Nakayama, MD;  Location: [facility];  Service: Orthopedics;  Laterality: Right;   TUBAL LIGATION  6378   UMBILICAL HERNIA REPAIR  yrs ago   VENTRAL HERNIA REPAIR  06/20/2012   Procedure: LAPAROSCOPIC VENTRAL HERNIA;  Surgeon: Edward Jolly, MD;  Location: [facility];  Service: General;  Laterality: N/A;  Laparoscopic Repair of Ventral Hernia with mesh   VENTRAL HERNIA REPAIR N/A 12/13/2013   Procedure: LAPAROSCOPIC REPAIR RECURRENT VENTRAL INCISIONAL  HERNIA;  Surgeon: Edward Jolly, MD;  Location: [facility];  Service:  General;  Laterality: N/A;    Social History   Socioeconomic History   Marital status: Married    Spouse name: Not on file   Number of children: Not on file   Years of education: Not on file   Highest education level: Not on file  Occupational History   Not on file  Tobacco Use   Smoking status: Former    Packs/day: 0.50    Years: 5.00    Additional pack years: 0.00    Total pack years: 2.50    Types: Cigarettes    Quit date: 09/19/1968    Years since quitting: 54.2   Smokeless tobacco: Never  Vaping Use   Vaping Use: Never used  Substance and Sexual Activity   Alcohol use: Yes    Alcohol/week: 2.0 standard drinks of alcohol  Types: 1 Cans of beer, 1 Shots of liquor per week    Comment: rarely   Drug use: No   Sexual activity: Yes  Other Topics Concern   Not on file  Social History Narrative   Lives in [place] with her husband.  She does not routinely exercise.   Social Determinants of Health   Financial Resource Strain: Not on file  Food Insecurity: Not on file  Transportation Needs: Not on file  Physical Activity: Not on file  Stress: Not on file  Social Connections: Not on file  Intimate Partner Violence: Not on file    Family History  Problem Relation Age of Onset   Arrhythmia Mother    Hypertension Mother    Stroke Mother    Stroke Maternal Grandmother    Diabetes Paternal Grandmother    Breast cancer Sister        42s   Breast cancer Sister        10s    ROS: no fevers or chills, productive cough, hemoptysis, dysphasia, odynophagia, melena, hematochezia, dysuria, hematuria, rash, seizure activity, orthopnea, PND, pedal edema, claudication. Remaining systems are negative.  Physical Exam: Well-developed well-nourished in no acute distress.  Skin is warm and dry.  HEENT is normal.  Neck is supple.  Chest is clear to auscultation with normal expansion.  Cardiovascular exam is regular rate and rhythm.  Abdominal exam nontender or distended. No  masses palpated. Extremities show no edema. neuro grossly intact   A/P  1 palpitations-patient has a long history of palpitations.  Note her LV function is normal.  Unchanged at present.  We discussed the potential for an [employer] or Apple Watch today.  She will research this and send the strips associated with her symptoms if she proceeds.  2 coronary artery disease-mild on most recent CTA.  Continue statin.  She is also on Plavix.  3 hypertension-blood pressure controlled.  Continue present medical regimen.  Check potassium and renal function.  4 hyperlipidemia-continue statin.  Check lipids and liver.  5 prior CVA-continue Plavix.  Kirk Ruths, MD

## 2022-11-25 ENCOUNTER — Other Ambulatory Visit: Payer: Self-pay | Admitting: Family Medicine

## 2022-11-25 DIAGNOSIS — E7800 Pure hypercholesterolemia, unspecified: Secondary | ICD-10-CM

## 2022-11-30 ENCOUNTER — Other Ambulatory Visit: Payer: Self-pay | Admitting: Family Medicine

## 2022-12-01 ENCOUNTER — Encounter: Payer: Self-pay | Admitting: Cardiology

## 2022-12-01 ENCOUNTER — Ambulatory Visit: Payer: Medicare PPO | Attending: Cardiology | Admitting: Cardiology

## 2022-12-01 VITALS — BP 122/66 | HR 64 | Ht 65.5 in | Wt 204.0 lb

## 2022-12-01 DIAGNOSIS — R072 Precordial pain: Secondary | ICD-10-CM

## 2022-12-01 DIAGNOSIS — E785 Hyperlipidemia, unspecified: Secondary | ICD-10-CM

## 2022-12-01 DIAGNOSIS — R002 Palpitations: Secondary | ICD-10-CM

## 2022-12-01 DIAGNOSIS — I10 Essential (primary) hypertension: Secondary | ICD-10-CM

## 2022-12-01 NOTE — Patient Instructions (Signed)
    Follow-Up: At [facility], you and your health needs are our priority.  As part of our continuing mission to provide you with exceptional heart care, we have created designated Provider Care Teams.  These Care Teams include your primary Cardiologist (physician) and Advanced Practice Providers (APPs -  Physician Assistants and Nurse Practitioners) who all work together to provide you with the care you need, when you need it.  We recommend signing up for the patient portal called "MyChart".  Sign up information is provided on this After Visit Summary.  MyChart is used to connect with patients for Virtual Visits (Telemedicine).  Patients are able to view lab/test results, encounter notes, upcoming appointments, etc.  Non-urgent messages can be sent to your provider as well.   To learn more about what you can do with MyChart, go to [email or web address].    Your next appointment:   6 month(s)  Provider:   Brian Crenshaw, MD      

## 2022-12-01 NOTE — Telephone Encounter (Signed)
Last OV-08/16/22 Last refill-05/06/2022--60 tabs, 5 refills  No future OV scheduled.

## 2022-12-11 ENCOUNTER — Other Ambulatory Visit: Payer: Self-pay | Admitting: Family Medicine

## 2022-12-13 ENCOUNTER — Other Ambulatory Visit: Payer: Self-pay | Admitting: Family Medicine

## 2022-12-14 NOTE — Telephone Encounter (Signed)
Last OV-08/16/22 Last refill-06/14/22-120 tabs, 5 refills  No future OV scheduled

## 2022-12-19 ENCOUNTER — Telehealth: Payer: Self-pay | Admitting: Family Medicine

## 2022-12-19 NOTE — Telephone Encounter (Signed)
FYI Spoke with pt stated that she declined going to the ED but wanted to see Dr Sarajane Jews for the bruising on her right eyelid. Pt denied pain in the eye stated that she just wants to know the cause of the bruising. Pt is on Blood thinner. Pt has an appointment with Dr Sarajane Jews on 12/20/22, Advised pt to see medical help is symptoms changes or gets worse

## 2022-12-19 NOTE — Telephone Encounter (Signed)
Pt called c/o bruise on rt eye lid and pt is on blood thinners. Triage outcome for pt to be seen in Urgent Care pt declined.   Please advise.

## 2022-12-20 ENCOUNTER — Ambulatory Visit: Payer: Medicare PPO | Admitting: Family Medicine

## 2022-12-20 ENCOUNTER — Encounter: Payer: Self-pay | Admitting: Family Medicine

## 2022-12-20 VITALS — BP 122/70 | HR 56 | Temp 98.3°F | Wt 206.0 lb

## 2022-12-20 DIAGNOSIS — S0083XA Contusion of other part of head, initial encounter: Secondary | ICD-10-CM

## 2022-12-20 DIAGNOSIS — S0011XA Contusion of right eyelid and periocular area, initial encounter: Secondary | ICD-10-CM

## 2022-12-20 NOTE — Progress Notes (Signed)
   Subjective:    Patient ID: Abigail [name], female    DOB: [birth year], 77 y.o.   MRN: [id number]  HPI Here to check what appears to be bruising on the right upper eyelid. No recent trauma. There is no pain or soreness. She apparently woke up with this today. She frequently has bruising on her arms and legs due to the Plavix she is taking.    Review of Systems  Constitutional: Negative.   HENT: Negative.    Eyes: Negative.   Respiratory: Negative.         Objective:   Physical Exam Constitutional:      Appearance: Normal appearance. She is not ill-appearing.  HENT:     Head: Normocephalic and atraumatic.     Nose: Nose normal.     Mouth/Throat:     Pharynx: Oropharynx is clear.  Eyes:     Conjunctiva/sclera: Conjunctivae normal.     Pupils: Pupils are equal, round, and reactive to light.     Comments: Right upper lid is slightly swollen and ecchymotic. The eye itself is clear. ROM is full, and there is no tenderness   Cardiovascular:     Pulses: Normal pulses.     Heart sounds: Normal heart sounds.  Lymphadenopathy:     Cervical: No cervical adenopathy.  Neurological:     Mental Status: She is alert.           Assessment & Plan:  She has a simple bruise to the eyelid, likely related to the Plavix. She likely slept with her face on her hand. Recheck as needed.  Alysia Penna, MD

## 2022-12-22 ENCOUNTER — Other Ambulatory Visit: Payer: Self-pay

## 2022-12-22 ENCOUNTER — Other Ambulatory Visit: Payer: Self-pay | Admitting: Family Medicine

## 2022-12-22 DIAGNOSIS — R311 Benign essential microscopic hematuria: Secondary | ICD-10-CM

## 2022-12-22 DIAGNOSIS — E7800 Pure hypercholesterolemia, unspecified: Secondary | ICD-10-CM

## 2022-12-22 MED ORDER — ATORVASTATIN CALCIUM 40 MG PO TABS: 40.0000 mg | ORAL_TABLET | Freq: Every day | ORAL | 0 refills | Status: DC

## 2023-01-02 DIAGNOSIS — M545 Low back pain, unspecified: Secondary | ICD-10-CM | POA: Diagnosis not present

## 2023-01-06 ENCOUNTER — Encounter: Payer: Self-pay | Admitting: Family Medicine

## 2023-01-06 ENCOUNTER — Ambulatory Visit (INDEPENDENT_AMBULATORY_CARE_PROVIDER_SITE_OTHER): Payer: Medicare PPO | Admitting: Family Medicine

## 2023-01-06 VITALS — BP 120/72 | HR 52 | Temp 98.5°F | Ht 65.5 in | Wt 205.0 lb

## 2023-01-06 DIAGNOSIS — I6381 Other cerebral infarction due to occlusion or stenosis of small artery: Secondary | ICD-10-CM

## 2023-01-06 DIAGNOSIS — I10 Essential (primary) hypertension: Secondary | ICD-10-CM | POA: Diagnosis not present

## 2023-01-06 DIAGNOSIS — E7800 Pure hypercholesterolemia, unspecified: Secondary | ICD-10-CM | POA: Diagnosis not present

## 2023-01-06 DIAGNOSIS — I200 Unstable angina: Secondary | ICD-10-CM | POA: Diagnosis not present

## 2023-01-06 DIAGNOSIS — R739 Hyperglycemia, unspecified: Secondary | ICD-10-CM | POA: Diagnosis not present

## 2023-01-06 DIAGNOSIS — Z8673 Personal history of transient ischemic attack (TIA), and cerebral infarction without residual deficits: Secondary | ICD-10-CM | POA: Diagnosis not present

## 2023-01-06 DIAGNOSIS — M4316 Spondylolisthesis, lumbar region: Secondary | ICD-10-CM | POA: Diagnosis not present

## 2023-01-06 DIAGNOSIS — E039 Hypothyroidism, unspecified: Secondary | ICD-10-CM | POA: Diagnosis not present

## 2023-01-06 DIAGNOSIS — G467 Other lacunar syndromes: Secondary | ICD-10-CM

## 2023-01-06 DIAGNOSIS — I679 Cerebrovascular disease, unspecified: Secondary | ICD-10-CM

## 2023-01-06 LAB — LIPID PANEL
Cholesterol: 178 mg/dL (ref 0–200)
HDL: 73.3 mg/dL (ref >39.00)
LDL Cholesterol: 83 mg/dL (ref 0–99)
NonHDL: 104.36
Total CHOL/HDL Ratio: 2
Triglycerides: 105 mg/dL (ref 0.0–149.0)
VLDL: 21 mg/dL (ref 0.0–40.0)

## 2023-01-06 LAB — CBC WITH DIFFERENTIAL/PLATELET
Basophils Absolute: 0 10*3/uL (ref 0.0–0.1)
Basophils Relative: 0.5 % (ref 0.0–3.0)
Eosinophils Absolute: 0.1 10*3/uL (ref 0.0–0.7)
Eosinophils Relative: 2.7 % (ref 0.0–5.0)
HCT: 41 % (ref 36.0–46.0)
Hemoglobin: 13.9 g/dL (ref 12.0–15.0)
Lymphocytes Relative: 29.3 % (ref 12.0–46.0)
Lymphs Abs: 1.6 10*3/uL (ref 0.7–4.0)
MCHC: 33.9 g/dL (ref 30.0–36.0)
MCV: 89.4 fl (ref 78.0–100.0)
Monocytes Absolute: 0.6 10*3/uL (ref 0.1–1.0)
Monocytes Relative: 10.1 % (ref 3.0–12.0)
Neutro Abs: 3.1 10*3/uL (ref 1.4–7.7)
Neutrophils Relative %: 57.4 % (ref 43.0–77.0)
Platelets: 163 10*3/uL (ref 150.0–400.0)
RBC: 4.59 Mil/uL (ref 3.87–5.11)
RDW: 13.7 % (ref 11.5–15.5)
WBC: 5.5 10*3/uL (ref 4.0–10.5)

## 2023-01-06 LAB — BASIC METABOLIC PANEL
BUN: 9 mg/dL (ref 6–23)
CO2: 24 mEq/L (ref 19–32)
Calcium: 9.6 mg/dL (ref 8.4–10.5)
Chloride: 107 mEq/L (ref 96–112)
Creatinine, Ser: 0.7 mg/dL (ref 0.40–1.20)
GFR: 83.26 mL/min (ref >60.00)
Glucose, Bld: 104 mg/dL — ABNORMAL HIGH (ref 70–99)
Potassium: 3.6 mEq/L (ref 3.5–5.1)
Sodium: 141 mEq/L (ref 135–145)

## 2023-01-06 LAB — HEMOGLOBIN A1C: Hgb A1c MFr Bld: 6.5 % (ref 4.6–6.5)

## 2023-01-06 LAB — HEPATIC FUNCTION PANEL
ALT: 13 U/L (ref 0–35)
AST: 14 U/L (ref 0–37)
Albumin: 4.1 g/dL (ref 3.5–5.2)
Alkaline Phosphatase: 111 U/L (ref 39–117)
Bilirubin, Direct: 0.1 mg/dL (ref 0.0–0.3)
Total Bilirubin: 0.4 mg/dL (ref 0.2–1.2)
Total Protein: 7.5 g/dL (ref 6.0–8.3)

## 2023-01-06 LAB — T4, FREE: Free T4: 1.06 ng/dL (ref 0.60–1.60)

## 2023-01-06 LAB — TSH: TSH: 1.13 u[IU]/mL (ref 0.35–5.50)

## 2023-01-06 LAB — T3, FREE: T3, Free: 2.5 pg/mL (ref 2.3–4.2)

## 2023-01-06 NOTE — Progress Notes (Signed)
Subjective:    Patient ID: Abigail [name], female    DOB: 1946/09/02, 77 y.o.   MRN: [id number]  HPI Here to follow up on issues. She is doing well in general. She saw Dr. Jens Som last month, and he is concerned about her episodes of rapid heartbeats. She denies any chest pain or SOB during these. He suggested she start wearing an Apple watch to monitor this, but she has not purchased one yet. She still deals with low back pain that radiates down both legs. She recently saw Dr. Margreta Journey, and she will have a lumbar spine MRI next week. Her BP has bee stable.    Review of Systems  Constitutional: Negative.   HENT: Negative.    Eyes: Negative.   Respiratory: Negative.    Cardiovascular:  Positive for palpitations.  Gastrointestinal: Negative.   Genitourinary:  Negative for decreased urine volume, difficulty urinating, dyspareunia, dysuria, enuresis, flank pain, frequency, hematuria, pelvic pain and urgency.  Musculoskeletal:  Positive for back pain.  Skin: Negative.   Neurological: Negative.  Negative for headaches.  Psychiatric/Behavioral: Negative.         Objective:   Physical Exam Constitutional:      General: She is not in acute distress.    Appearance: She is well-developed.     Comments: Walks with a cane   HENT:     Head: Normocephalic and atraumatic.     Right Ear: External ear normal.     Left Ear: External ear normal.     Nose: Nose normal.     Mouth/Throat:     Pharynx: No oropharyngeal exudate.  Eyes:     General: No scleral icterus.    Conjunctiva/sclera: Conjunctivae normal.     Pupils: Pupils are equal, round, and reactive to light.  Neck:     Thyroid: No thyromegaly.     Vascular: No JVD.  Cardiovascular:     Rate and Rhythm: Normal rate and regular rhythm.     Pulses: Normal pulses.     Heart sounds: Normal heart sounds. No murmur heard.    No friction rub. No gallop.  Pulmonary:     Effort: Pulmonary effort is normal. No respiratory distress.      Breath sounds: Normal breath sounds. No wheezing or rales.  Chest:     Chest wall: No tenderness.  Abdominal:     General: Bowel sounds are normal. There is no distension.     Palpations: Abdomen is soft. There is no mass.     Tenderness: There is no abdominal tenderness. There is no guarding or rebound.  Musculoskeletal:        General: No tenderness. Normal range of motion.     Cervical back: Normal range of motion and neck supple.  Lymphadenopathy:     Cervical: No cervical adenopathy.  Skin:    General: Skin is warm and dry.     Findings: No erythema or rash.  Neurological:     Mental Status: She is alert and oriented to person, place, and time.     Cranial Nerves: No cranial nerve deficit.     Motor: No abnormal muscle tone.     Coordination: Coordination normal.     Deep Tendon Reflexes: Reflexes are normal and symmetric. Reflexes normal.  Psychiatric:        Mood and Affect: Mood normal.        Behavior: Behavior normal.        Thought Content: Thought content normal.  Judgment: Judgment normal.           Assessment & Plan:  Her HTN is well controlled, but she has spells of rapid heart beats. I encouraged her to get an Apple watch as above to monitor this. She will follow up with Dr. Margreta Journey about the low back pain. Her neurologic status has been stable after her stroke. She still takes Plavix. Get fasting labs to check lipids, a thyroid panel, etc. We spent a total of ( 33  ) minutes reviewing records and discussing these issues.  Gershon Crane, MD  Gershon Crane, MD

## 2023-01-12 DIAGNOSIS — M545 Low back pain, unspecified: Secondary | ICD-10-CM | POA: Diagnosis not present

## 2023-01-17 ENCOUNTER — Encounter: Payer: Self-pay | Admitting: Neurology

## 2023-01-17 ENCOUNTER — Ambulatory Visit: Payer: Medicare PPO | Admitting: Neurology

## 2023-01-17 VITALS — BP 128/76 | HR 58 | Ht 65.0 in | Wt 207.0 lb

## 2023-01-17 DIAGNOSIS — Z8673 Personal history of transient ischemic attack (TIA), and cerebral infarction without residual deficits: Secondary | ICD-10-CM | POA: Diagnosis not present

## 2023-01-17 DIAGNOSIS — R269 Unspecified abnormalities of gait and mobility: Secondary | ICD-10-CM

## 2023-01-17 DIAGNOSIS — M5416 Radiculopathy, lumbar region: Secondary | ICD-10-CM | POA: Diagnosis not present

## 2023-01-17 NOTE — Progress Notes (Signed)
[facility] [address] [place]. [place] [address] [phone]       OFFICE FOLLOW-UP NOTE  Abigail [name] Date of Birth:  [birth year] Medical Record Number:  [id number]   HPI: Update 09/27/2021 Abigail [name] is a 77 year old African-American lady with longstanding history of a multifactorial gait and balance difficulties due to combination of old right pontine and cerebellar strokes as well as left thalamic strokes.  Vascular risk factors of hypertension, hyperlipidemia, history of sleep apnea and multiple strokes.  She had seen me in the clinic several times with last visit being on 04/01/2019.  She returns today for worsening of her existing symptoms.  For the last 9 months she has noticed that her balance is difficult.  She tires easily.  She has been using a cane almost constantly.  She still feels that she is starting to 1 side and not sure of her balance.  She does have chronic low back pain and right hip pain.  She has seen Dr. Vertell Limber in the past she had refused back surgery.  She continues to have severe right hip and thigh pain which also limits her walking.  She recently had significant elevation in the blood pressure and saw her cardiologist who increased her medications.  She denies any tingling numbness or pain or burning in her feet.  She however had an episode of gout and painful swollen toes for which she saw her primary physician who gave her a course of steroids which seems to have helped.  She has not had any recent visits with her orthopedic surgeon or with Dr. Vertell Limber her back surgeon.  Review of electronic medical records show that she had lab work on 08/20/2021 which showed LDL cholesterol of 82 mg percent.  Her last hemoglobin A1c was on 11/24/20) 6.1.  Last carotid ultrasound was on 04/06/2019 and was unremarkable. Initial Consult 06/03/2015 :  Abigail [name] is a 6 year pleasant lady whose had five-month history of gait and balance difficulties. She just feels she often leans to  the left and stumbles if she is not careful. She feels the symptoms began suddenly one day when she noticed trouble with balance and walking she also had some trouble swallowing but she did not seek immediate help at that time. She does have a prior history of her left thalamic lacunar infarct in 2007 for which she had seen me. That was felt to be due to small vessel disease and she was started on Aggrenox which did not tolerate due to side effects. She was lost to medical follow-up to me. She is recently had an outpatient MRI scan of the brain which have personally reviewed on 05/02/15 which does show a right pontine and cerebellar hyperintensity which is likely a age indeterminate lacunar infarcts which were not noticed on the previous MRI and perhaps may be the explanation of her recent dizziness and gait difficulties. The patient is currently on Plavix which is tolerating well without bleeding or bruising. She states her blood pressure has been difficult to control and today it is elevated at 171/97 in office. She did also had some recent chest pain or shortness of breath for which she underwent cardiac catheterization last Monday which did not show any correctable lesion. Dr. Stanford Breed for cardiologist has also added a baby aspirin and she seems to be having increased bruising tendency since then. She's not sure when her last lipid profile was checked but she does take Lipitor 40 mg daily which is  tolerating well without side effects. She does have history of sleep apnea and had surgery for that and does not use CPAP Update 11/17/15 : She returns for follow-up after last visit 5 months ago. She is accompanied by husband. Patient states she was doing fine until a few weeks ago since then she's noticed intermittent tingling in the left hand as well as weakness and dropping of objects from that hand. She's also noticed some walking difficulty and having to drag her left leg. She denies any slurred speech, headache,  fall or injury. She was seen by me in September of last year and at that time MRI scan of the brain prior to that visit had shown small remote age lacunar infarcts in the right cerebellum and pons. I had ordered MRA of the neck and brain which have personally reviewed done on 06/03/15 which showed no significant intra-or extracranial stenosis. Lab work on 06/03/15 showed LDL cholesterol 84 but HDL of 77 with the ratio of 2.3. Hemoglobin A1c was borderline at 6.0. Patient has been on Plavix since 2006 following her previous stroke. She was unable to tolerate Aggrenox at that time. Patient states she is having gradual worsening of her balance and walking since spring of last year and perhaps had a small stroke at that time which was not recognized. She states she is tolerating Plavix well without bleeding or bruising. Her blood pressure is well controlled and today it is 139/76. The patient is refusing a referral to physical outpatient therapy but she wants to go and participate in the Silver sneakers program at the [facility]. She is tolerating Lipitor without myalgias or arthralgias. She does use a CPAP for sleep apnea. Update 05/19/2017 ; she returns for follow-up after last visit for year and a half ago. She however was admitted to the hospital in June 2018 with a right lip and hand paresthesias. MRI scan did show a small left thalamic lacunar infarct. MRA of the brain was unremarkable. Carotid Doppler showed no significant extracranial stenosis. Transthoracic echo showed normal ejection fraction. LDL cholesterol was borderline at 72 mg percent. Hemoglobin A1c was 6.1. Patient was started on aspirin 81 and Plavix 75 mg per 3 weeks and since then she has discontinued aspirin and is currently on Plavix alone. She is tolerating well without bruising or bleeding. She states her right lip numbness has resolved but right little finger in [place] persists. She has seen Dr. Vertell Limber for back pain and he has discontinued both) and  replace it with Aleve and tramadol. Patient also complains of subjective mild memory difficulties since her stroke. She has trouble remembering recent information  and names. Update 10/31/2018 : She returns for follow-up after last visit a year and half ago.  She is accompanied by her husband.  Patient is doing well from stroke standpoint without recurrent stroke or TIA symptoms.  She states she had hip replacement surgery in December 2019 and a few days later developed leg pain and swelling and was diagnosed as femoral vein deep vein thrombosis.  She has since stopped Plavix and has been switched to Xarelto which she seems to be tolerating well without bleeding or bruising.  She states her blood pressure is under good control though today it is slightly elevated at 1 four 8/83.  She is tolerating Lipitor well without any side effects.  Her last lipid profile was satisfactory.  She has no new neurological complaints.  She is walking with a cane and has had no falls or  injuries.  She states her mild subjective memory difficulties are unchanged and are not getting worse Update 04/01/2019 : She returns for follow-up after last visit 5 months ago.  She has not had any recurrent stroke or TIA symptoms.  She continues to have back pain and hip pain and walking difficulties mostly related to her bad hip.  She had hip replacement surgery in December but has had persistent pain.  She recently underwent epidural back injection by orthopedic surgeon without significant relief.  She complains of difficulty swallowing for the last 1 month which is intermittent and occurs once or twice a week.  This is mainly when she is trying to swallow liquids.  She initially has trouble initiating it but once she can swallow the fluid goes down well.  She denies any nasal regurgitation coughing or gagging.  She is tolerating Plavix well with only minor bruising and no bleeding.  Her blood pressures well controlled and today it is 140/78.  She  remains on Lipitor which is tolerating well and states lipid profile was checked only last year was satisfactory.  She has not had follow-up carotid ultrasound and for more than a year.  Update 02/15/2022 : She returns for follow-up after last visit 5 months ago.  Patient continues to have gait and balance difficulties which are unchanged.  She is using a cane now mostly all the time she has had a couple of near falls 2.  She fell once on the gravel but luckily did not have any major injuries.  She did undergo EMG nerve conduction study on 11/25/2021 which was normal and did not show any underlying neuropathy or radiculopathy.  MRI scan of the brain on 10/21/2021 showed no acute abnormalities.  Extensive changes of small vessel disease were noted.  MR angiogram of the brain and neck did not reveal any major large vessel stenosis or occlusion.  She will develop COVID in April and was symptomatic for 3 to 4 days and was treated as an outpatient with primary care physician.  She remains on Plavix which is tolerating well with only easy bruising but no bleeding episodes.  Her blood pressures under good control and today it is 132/81.  She remains on Lipitor which is tolerating well without muscle aches and pains and last lipid profile on 09/29/2021 showed LDL cholesterol 92 mg percent.  Hemoglobin A1c was 5.9.  She has no other new complaints.  She has had no recurrent stroke or TIA symptoms. Update 01/17/2023 : She returns for follow-up after last visit a year ago.  Patient continues to do well from neurovascular standpoint.  Has not had recurrent stroke or TIA symptoms.  Remains on Plavix which is tolerating well without significant bruising or bleeding.  Blood pressure is well-controlled on Norvasc and Cozaar and Toprol.  He is tolerating Lipitor well without side effects.  Lipid profile on 12/27/2022 showed LDL cholesterol to be 83 mg percent.  Hemoglobin A1c was borderline at 6.5.  Patient continues G[place] difficulties  mostly related to chronic back and hip pain.  He is also complaining of sciatica in both legs.  He has been evaluated by orthopedic surgeon Dr. Levon Hedger had an MRI of the spine and he has an appointment to see him today to discuss the results available for me today.  Possibly be referred to Dr. Modesto Charon for spine injections as she is reluctant to have any surgery. ROS:   14 system review of systems is positive for back pain, sciatica bruising,  sore throat, hip pain, thigh pain, difficulty walking, imbalance, back pain, gout, trouble swallowing, headache all other systems negative  PMH:  Past Medical History:  Diagnosis Date   Bronchitis 09/2016   Bruises easily    DDD (degenerative disc disease)    Degenerative joint disease of cervical spine 09-05-11   Cervival area, now some osteoarthritis-lower back and Rt. shoulder   Depression    Diverticulitis 09-05-11   hx. gastritis, diverticulitis x2 -now surgery planned   DIVERTICULITIS, ACUTE 02/10/2010   Dyspnea    with exertion   Essential hypertension 09-05-11   tx. Verapamil   GROIN PAIN 09/21/2010   Headache(784.0)    headaches are better   Hearing loss    right ear   Heart palpitations    Hemorrhoids    History of bronchitis    History of colonic polyps    History of pneumonia    History of tension headache    Hyperlipidemia    Hypertension    Hypothyroidism 09-05-11   Supplement used   Late effect of adverse effect of drug, medicinal or biological substance    NECK PAIN, ACUTE 09/21/2010   Neuromuscular disorder (HCC)    carpal tunnel in both hands   Nocturia    Osteoarthritis 112-17-12   spine and rt. hip, rt. shoulder   Patent foramen ovale    Small - unable to be closed -    Pneumonia    PONV (postoperative nausea and vomiting)    Sleep apnea 09-05-11   no cpap ever, had surgery to remove cartilage, no problems now   SORE THROAT 04/30/2009   Stroke (HCC) 09-05-11   2006/2009-(loss of memory, balance issues remains occ.)    TIA 09/28/2007, 10/2014   Unstable angina (HCC)    a. 05/2010 Cath: nl cors, EF 55%;  b. 05/2015 Lexiscan MV: small, severe, fixed apical defect and a small, severe, reversible inf lateral defect w/ apical thinning and mild ischemia, EF 54%.   URI 09/02/2008   Vertigo     Social History:  Social History   Socioeconomic History   Marital status: Married    Spouse name: Not on file   Number of children: Not on file   Years of education: Not on file   Highest education level: Not on file  Occupational History   Not on file  Tobacco Use   Smoking status: Former    Packs/day: 0.50    Years: 5.00    Additional pack years: 0.00    Total pack years: 2.50    Types: Cigarettes    Quit date: 09/19/1968    Years since quitting: 54.3   Smokeless tobacco: Never  Vaping Use   Vaping Use: Never used  Substance and Sexual Activity   Alcohol use: Yes    Alcohol/week: 2.0 standard drinks of alcohol    Types: 1 Cans of beer, 1 Shots of liquor per week    Comment: rarely   Drug use: No   Sexual activity: Yes  Other Topics Concern   Not on file  Social History Narrative   Lives in [place] with her husband.  She does not routinely exercise.   Social Determinants of Health   Financial Resource Strain: Not on file  Food Insecurity: Not on file  Transportation Needs: Not on file  Physical Activity: Not on file  Stress: Not on file  Social Connections: Not on file  Intimate Partner Violence: Not on file    Medications:   Current Outpatient  Medications on File Prior to Visit  Medication Sig Dispense Refill   amLODipine (NORVASC) 10 MG tablet Take 1 tablet (10 mg total) by mouth daily. 90 tablet 3   atorvastatin (LIPITOR) 40 MG tablet Take 1 tablet (40 mg total) by mouth daily. 30 tablet 0   butalbital-acetaminophen-caffeine (FIORICET) 50-325-40 MG tablet TAKE 1 TABLET BY MOUTH EVERY 6 HOURS AS NEEDED FOR HEADACHE 60 tablet 5   clopidogrel (PLAVIX) 75 MG tablet Take 1 tablet (75 mg total) by  mouth daily. *Labs required for future refills* 90 tablet 0   levothyroxine (SYNTHROID) 125 MCG tablet TAKE 1 TABLET BY MOUTH EVERY DAY BEFORE BREAKFAST 90 tablet 0   losartan (COZAAR) 100 MG tablet TAKE 1 TABLET BY MOUTH EVERY DAY 90 tablet 3   metoprolol succinate (TOPROL-XL) 25 MG 24 hr tablet TAKE 1 TABLET BY MOUTH EVERY DAY. (NEED APPT FOR REFILLS) 90 tablet 3   traMADol (ULTRAM) 50 MG tablet TAKE 1 TO 2 TABLETS BY MOUTH EVERY 4 TO 6 HOURS AS NEEDED FOR PAIN 120 tablet 5   No current facility-administered medications on file prior to visit.    Allergies:   Allergies  Allergen Reactions   Dilaudid [Hydromorphone Hcl] Shortness Of Breath, Nausea And Vomiting and Other (See Comments)    Able to take morphine without issue   Shellfish Allergy Shortness Of Breath and Rash    Only shrimp allergy   Aggrenox [Aspirin-Dipyridamole Er] Nausea And Vomiting and Other (See Comments)    Headache    Codeine Nausea And Vomiting   Doxycycline Monohydrate Diarrhea   Cephalexin Itching and Rash   Codeine Phosphate Nausea And Vomiting   Hydrocodone Nausea And Vomiting and Other (See Comments)    Can take morphine without issue   Meperidine Hcl Nausea And Vomiting   Percocet [Oxycodone-Acetaminophen] Nausea And Vomiting and Other (See Comments)    Can take morphine without issue    Physical Exam  Vitals:   01/17/23 0757  BP: 128/76  Pulse: (!) 58   General: Mildly obese elderly African-American lady , seated, in no evident distress Head: head normocephalic and atraumatic.   Neck: supple with no carotid or supraclavicular bruits Cardiovascular: regular rate and rhythm, no murmurs Musculoskeletal: no deformity Skin:  no rash/petichiae Vascular:  Normal pulses all extremities   Neurologic Exam Mental Status: Awake and fully alert. Oriented to place and time. Recent and remote memory intact. Attention span, concentration and fund of knowledge appropriate. Mood and affect appropriate.   Cranial Nerves: Fundoscopic exam not done. Pupils equal, briskly reactive to light. Extraocular movements full without nystagmus. Visual fields full to confrontation. Hearing intact. Facial sensation intact. Face, tongue, palate moves normally and symmetrically.  Motor: Normal bulk and tone.Mild weakness of left grip and intrinsic hand muscles. Orbits right over left upper extremity. Mild weakness of right hip flexors, extensors and adductor's due to pain,   Sensory.: intact to touch , pinprick , position and vibratory sensation. Mild subjective paresthesias in the right little finger but no objective sensory loss Coordination: Rapid alternating movements normal in all extremities. Finger-to-nose and heel-to-shin performed accurately bilaterally. Gait and Station: Arises from chair without difficulty. Stance is slightly broad-based Gait demonstrates favoring of the right hip due to pain and stiffness and dragging of the right leg.. Unable to heel, toe and tandem walk without difficulty.  She uses a cane for walking. Reflexes: 1+ and symmetric. Toes downgoing.         ASSESSMENT: 77 year old lady with 3-year history  of worsening chronic multifactorial gait and balance difficulties following remote right pontine and cerebellar infarcts in 2016 as well as left thalamic infarct in 2007 which appeared to have worsened the last year and a half likely due to progressive degenerative spine disease     PLAN:I had a long discussion with the patient regarding her gait imbalance and fall risk and answered questions. .  I recommend she stay on Plavix for stroke prevention and maintain aggressive risk factor modification with strict control of hypertension with blood pressure goal below 130/90, lipids with LDL cholesterol goal below 70 mg percent and diabetes with hemoglobin A1c goal below 6.5%.  We also discussed fall prevention precautions and encouraged to use a cane at all times and to consider using a walker  while walking outdoors for long distances.  Continue follow-up with Dr. Jerl Santos for management of her chronic back pain and sciatica.  Check screening carotid ultrasound study.  She will return for follow-up in the future in a year or call earlier if necessary. Greater than 50% of time during this 35-minute visit was spent on counseling,explanation of diagnosis, planning of further management, discussion with patient and family and coordination of care Delia Heady MD Note: This document was prepared with digital dictation and possible smart phrase technology. Any transcriptional errors that result from this process are unintentional

## 2023-01-17 NOTE — Patient Instructions (Addendum)
I had a long discussion with the patient regarding her gait imbalance and fall risk and answered questions. .  I recommend she stay on Plavix for stroke prevention and maintain aggressive risk factor modification with strict control of hypertension with blood pressure goal below 130/90, lipids with LDL cholesterol goal below 70 mg percent and diabetes with hemoglobin A1c goal below 6.5%.  We also discussed fall prevention precautions and encouraged to use a cane at all times and to consider using a walker while walking outdoors for long distances.  Continue follow-up with Dr. Jerl Santos for management of her chronic back pain and sciatica.  Check screening carotid ultrasound study.  She will return for follow-up in the future in a year or call earlier if necessary.

## 2023-01-18 ENCOUNTER — Telehealth: Payer: Self-pay | Admitting: Neurology

## 2023-01-18 NOTE — Telephone Encounter (Signed)
Received a surgical clearance form for the patient from [facility]. Placed in Dr Ronnald Ramp box to be signed.

## 2023-01-20 DIAGNOSIS — M5416 Radiculopathy, lumbar region: Secondary | ICD-10-CM | POA: Diagnosis not present

## 2023-01-30 DIAGNOSIS — M5416 Radiculopathy, lumbar region: Secondary | ICD-10-CM | POA: Diagnosis not present

## 2023-02-01 ENCOUNTER — Telehealth: Payer: Self-pay | Admitting: Family Medicine

## 2023-02-01 NOTE — Telephone Encounter (Signed)
Contacted [name] to schedule their annual wellness visit. Call back at later date: 02/2023  Rudell Cobb AWV direct phone # [phone]  Patient going out of town and wanted a call back in june

## 2023-02-05 ENCOUNTER — Other Ambulatory Visit: Payer: Self-pay | Admitting: Family Medicine

## 2023-02-07 DIAGNOSIS — D485 Neoplasm of uncertain behavior of skin: Secondary | ICD-10-CM | POA: Diagnosis not present

## 2023-02-14 ENCOUNTER — Ambulatory Visit (HOSPITAL_COMMUNITY)
Admission: RE | Admit: 2023-02-14 | Discharge: 2023-02-14 | Disposition: A | Discharge status: Home / Self Care | Payer: Medicare PPO | Source: Ambulatory Visit | Attending: Neurology | Admitting: Neurology

## 2023-02-14 DIAGNOSIS — R269 Unspecified abnormalities of gait and mobility: Secondary | ICD-10-CM | POA: Insufficient documentation

## 2023-02-14 DIAGNOSIS — Z8673 Personal history of transient ischemic attack (TIA), and cerebral infarction without residual deficits: Secondary | ICD-10-CM | POA: Diagnosis not present

## 2023-02-14 NOTE — Progress Notes (Signed)
Bilateral carotid ultrasound study completed.   Please see CV Procedures for preliminary results.  Saydi Kobel, RVT  10:20 AM 02/14/23

## 2023-02-15 ENCOUNTER — Other Ambulatory Visit: Payer: Self-pay | Admitting: Orthopedic Surgery

## 2023-02-15 NOTE — Progress Notes (Signed)
Kindly inform the patient that carotid ultrasound study did not show any evidence of significant carotid narrowing on either side.

## 2023-02-16 ENCOUNTER — Ambulatory Visit: Payer: Medicare PPO | Admitting: Neurology

## 2023-02-16 ENCOUNTER — Telehealth: Payer: Self-pay | Admitting: *Deleted

## 2023-02-16 ENCOUNTER — Other Ambulatory Visit: Payer: Self-pay | Admitting: Family Medicine

## 2023-02-16 DIAGNOSIS — E7800 Pure hypercholesterolemia, unspecified: Secondary | ICD-10-CM

## 2023-02-16 NOTE — Progress Notes (Signed)
Kindly inform the patient that carotid ultrasound study shows no significant narrowing of either carotid artery in the neck

## 2023-02-16 NOTE — Telephone Encounter (Signed)
   Pre-operative Risk Assessment    Patient Name: Abigail [name]  DOB: [birth year] MRN: [id number]      Request for Surgical Clearance    Procedure:   7/17 PT HAVING LEFT SIDED LUMBAR 2-3, 3-4 LATERAL 7/18 POSTERIOR DECOMPRESSION AND FUSION WITH INSTRUMENTATION AND ALLOGRAFT   Date of Surgery:  Clearance 04/05/23                                 Surgeon:  Estill Bamberg, MD Surgeon's Group or Practice Name:  [facility] Phone number:  [phone] Fax number:  [phone]   Type of Clearance Requested:   - Medical  - Pharmacy:  Hold Clopidogrel (Plavix) NOT INDICATED HOW LONG   Type of Anesthesia:  Not Indicated   Additional requests/questions:    Wilhemina Cash   02/16/2023, 10:20 AM

## 2023-02-16 NOTE — Telephone Encounter (Signed)
   Name: Abigail [name]  DOB: 05-11-1946  MRN: [id number]  Primary Cardiologist: [name], MD   Preoperative team, please contact this patient and set up a phone call appointment for further preoperative risk assessment. Please obtain consent and complete medication review. Thank you for your help.  I confirm that guidance regarding antiplatelet and oral anticoagulation therapy has been completed and, if necessary, noted below.  Patient's Plavix is prescribed by PCP for history of CVA.  Recommendations for holding Plavix will need to come from prescribing provider.   Ronney Asters, NP 02/16/2023, 10:49 AM [facility]

## 2023-02-16 NOTE — Telephone Encounter (Signed)
1st attempt to reach pt regarding surgical clearance and the need for a tele-visit.  Left pt a message to call back and ask for the preop team. 

## 2023-02-20 ENCOUNTER — Telehealth: Payer: Self-pay

## 2023-02-20 NOTE — Telephone Encounter (Signed)
NA

## 2023-02-20 NOTE — Telephone Encounter (Signed)
2nd attempt to reach pt to schedule tele visit. No answer. Lvm

## 2023-02-21 ENCOUNTER — Telehealth: Payer: Self-pay | Admitting: Cardiology

## 2023-02-21 NOTE — Telephone Encounter (Signed)
Patient has right ankle swelling for several months.  States gets up to the size of "a baseball".  States it will "be so huge it bothers me and stopped the medication two nights ago".  She sees it more at night when she is sitting down.  She does sometimes see it during the ay. Has seen it so much that "I am just used to it, but it was so big the other day I was worried." She states she stopped the medication and there is no more swelling.   She states a year ago her PCP told her the swelling was possibly due to the amlodipine and placed her on a diuretic.  This caused her to go to the bathroom so much that she didn't want to be on it anymore and stopped.   She does not take her BP. So unsure if any changes with her BP.   Please advise

## 2023-02-21 NOTE — Telephone Encounter (Signed)
Pt c/o medication issue:  1. Name of Medication:  Amlodipine  2. How are you currently taking this medication (dosage and times per day)? 1 time a day  3. Are you having a reaction (difficulty breathing--STAT)?   4. What is your medication issue? Right ankle swelling

## 2023-02-22 ENCOUNTER — Telehealth: Payer: Self-pay | Admitting: *Deleted

## 2023-02-22 NOTE — Telephone Encounter (Signed)
  Patient Consent for Virtual Visit        [name] has provided verbal consent on 02/22/2023 for a virtual visit (video or telephone).   CONSENT FOR VIRTUAL VISIT FOR:  Abigail [name]  By participating in this virtual visit I agree to the following:  I hereby voluntarily request, consent and authorize [facility] and its employed or contracted physicians, physician assistants, nurse practitioners or other licensed health care professionals (the Practitioner), to provide me with telemedicine health care services (the "Services") as deemed necessary by the treating Practitioner. I acknowledge and consent to receive the Services by the Practitioner via telemedicine. I understand that the telemedicine visit will involve communicating with the Practitioner through live audiovisual communication technology and the disclosure of certain medical information by electronic transmission. I acknowledge that I have been given the opportunity to request an in-person assessment or other available alternative prior to the telemedicine visit and am voluntarily participating in the telemedicine visit.  I understand that I have the right to withhold or withdraw my consent to the use of telemedicine in the course of my care at any time, without affecting my right to future care or treatment, and that the Practitioner or I may terminate the telemedicine visit at any time. I understand that I have the right to inspect all information obtained and/or recorded in the course of the telemedicine visit and may receive copies of available information for a reasonable fee.  I understand that some of the potential risks of receiving the Services via telemedicine include:  Delay or interruption in medical evaluation due to technological equipment failure or disruption; Information transmitted may not be sufficient (e.g. poor resolution of images) to allow for appropriate medical decision making by the Practitioner; and/or   In rare instances, security protocols could fail, causing a breach of personal health information.  Furthermore, I acknowledge that it is my responsibility to provide information about my medical history, conditions and care that is complete and accurate to the best of my ability. I acknowledge that Practitioner's advice, recommendations, and/or decision may be based on factors not within their control, such as incomplete or inaccurate data provided by me or distortions of diagnostic images or specimens that may result from electronic transmissions. I understand that the practice of medicine is not an exact science and that Practitioner makes no warranties or guarantees regarding treatment outcomes. I acknowledge that a copy of this consent can be made available to me via my patient portal [facility] MyChart), or I can request a printed copy by calling the office of [facility].    I understand that my insurance will be billed for this visit.   I have read or had this consent read to me. I understand the contents of this consent, which adequately explains the benefits and risks of the Services being provided via telemedicine.  I have been provided ample opportunity to ask questions regarding this consent and the Services and have had my questions answered to my satisfaction. I give my informed consent for the services to be provided through the use of telemedicine in my medical care

## 2023-02-22 NOTE — Telephone Encounter (Signed)
Spoke with patient and scheduled her for a tele pre-op visit for 03/05/23 at 2:00 PM. Meds reviewed and consent in. Will route back to requesting surgeons office to make them aware.

## 2023-02-22 NOTE — Telephone Encounter (Signed)
Abigail Bunting, MD  Abigail [name], RN7 hours ago (1:36 AM)    DC amlodipine and follow BP [name]   Returned call to patient and made her aware. Patient verbalized understanding of all instructions.   Amlodipine removed from patients medication list. Advised patient to track BP and let us know if any issues.  Patient verbalized understanding.   Advised patient to call back to office with any issues, questions, or concerns. Patient verbalized understanding.

## 2023-02-23 ENCOUNTER — Other Ambulatory Visit: Payer: Self-pay | Admitting: Family Medicine

## 2023-02-23 DIAGNOSIS — Z1231 Encounter for screening mammogram for malignant neoplasm of breast: Secondary | ICD-10-CM

## 2023-02-24 ENCOUNTER — Other Ambulatory Visit: Payer: Self-pay | Admitting: Family Medicine

## 2023-02-24 DIAGNOSIS — E7800 Pure hypercholesterolemia, unspecified: Secondary | ICD-10-CM

## 2023-02-27 ENCOUNTER — Encounter: Payer: Self-pay | Admitting: Neurology

## 2023-03-01 NOTE — Progress Notes (Signed)
Virtual Visit via Telephone Note   Because of Abigail [name]'s co-morbid illnesses, she is at least at moderate risk for complications without adequate follow up.  This format is felt to be most appropriate for this patient at this time.  The patient did not have access to video technology/had technical difficulties with video requiring transitioning to audio format only (telephone).  All issues noted in this document were discussed and addressed.  No physical exam could be performed with this format.  Please refer to the patient's chart for her consent to telehealth for [facility].  Evaluation Performed:  Preoperative cardiovascular risk assessment _____________   Date:  03/02/2023   Patient ID:  Abigail [name], DOB 10/10/1945, MRN [id number] Patient Location:  Home Provider location:   Office  Primary Care Provider:  Nelwyn Salisbury, MD Primary Cardiologist:  [name], MD  Chief Complaint / Patient Profile   77 y.o. y/o female with a h/o CVA, noted to have PFO on TEE in 2006 following CVA, cardiac catheterization September 2016 showed nonobstructive coronary disease, mild bilateral carotid stenosis, coronary calcium score of 58 on CT January 2024 with mild stenosis in the second diagonal and right coronary artery, palpitations who is pending left sided L2-3, L3-4 lateral posterior decompression and fusion with instrumentation and allograft and presents today for telephonic preoperative cardiovascular risk assessment.  History of Present Illness    Abigail [name] is a 77 y.o. female who presents via audio/video conferencing for a telehealth visit today.  Pt was last seen in cardiology clinic on 12/01/22 by Dr. Jens Som.  At that time Abigail [name] was doing well.  The patient is now pending procedure as outlined above. Since her last visit, she  denies chest pain,  fatigue, melena, hematuria, hemoptysis, diaphoresis, weakness, presyncope, syncope, orthopnea, and PND. She reports chronic  shortness of breath that she feels is stable. She reports significant ankle swelling on amlodipine. Since stopping amlodipine, she has been monitoring BP and will bring records to next office visit, swelling has improved. She uses a cane to walk 2/2 back and leg pain, but remains active doing her own shopping and house cleaning, just moves at a slower pace.   Past Medical History    Past Medical History:  Diagnosis Date   Bronchitis 09/2016   Bruises easily    DDD (degenerative disc disease)    Degenerative joint disease of cervical spine 09-05-11   Cervival area, now some osteoarthritis-lower back and Rt. shoulder   Depression    Diverticulitis 09-05-11   hx. gastritis, diverticulitis x2 -now surgery planned   DIVERTICULITIS, ACUTE 02/10/2010   Dyspnea    with exertion   Essential hypertension 09-05-11   tx. Verapamil   GROIN PAIN 09/21/2010   Headache(784.0)    headaches are better   Hearing loss    right ear   Heart palpitations    Hemorrhoids    History of bronchitis    History of colonic polyps    History of pneumonia    History of tension headache    Hyperlipidemia    Hypertension    Hypothyroidism 09-05-11   Supplement used   Late effect of adverse effect of drug, medicinal or biological substance    NECK PAIN, ACUTE 09/21/2010   Neuromuscular disorder (HCC)    carpal tunnel in both hands   Nocturia    Osteoarthritis 112-17-12   spine and rt. hip, rt. shoulder   Patent foramen ovale  Small - unable to be closed -    Pneumonia    PONV (postoperative nausea and vomiting)    Sleep apnea 09-05-11   no cpap ever, had surgery to remove cartilage, no problems now   SORE THROAT 04/30/2009   Stroke (HCC) 09-05-11   2006/2009-(loss of memory, balance issues remains occ.)   TIA 09/28/2007, 10/2014   Unstable angina (HCC)    a. 05/2010 Cath: nl cors, EF 55%;  b. 05/2015 Lexiscan MV: small, severe, fixed apical defect and a small, severe, reversible inf lateral defect w/ apical  thinning and mild ischemia, EF 54%.   URI 09/02/2008   Vertigo    Past Surgical History:  Procedure Laterality Date   ABDOMINAL HYSTERECTOMY     BACK SURGERY     CARDIAC CATHETERIZATION N/A 06/01/2015   Procedure: Left Heart Cath and Coronary Angiography;  Surgeon: Kathleene Hazel, MD;  Location: [facility] INVASIVE CV LAB;  Service: Cardiovascular;  Laterality: N/A;   COLON RESECTION  09/07/2011   Procedure: LAPAROSCOPIC SIGMOID COLON RESECTION;  Surgeon: Mariella Saa, MD;  Location: [facility] ORS;  Service: General;  Laterality: N/A;  with proctoscopy   COLONOSCOPY  08/06/2015   per Dr. Kinnie Scales, adenomatous polyps, repeat in 5 yrs    CYST EXCISION N/A 11/29/2016   Procedure: EXCISION UMBILICAL CYST;  Surgeon: Glenna Fellows, MD;  Location: [facility] ORS;  Service: General;  Laterality: N/A;   ELBOW SURGERY  09-05-11   left elbow -ligament repair   EYE SURGERY     cataract   INSERTION OF MESH N/A 12/13/2013   Procedure: INSERTION OF MESH;  Surgeon: Mariella Saa, MD;  Location: [facility];  Service: General;  Laterality: N/A;   LAPAROSCOPY  09/07/2011   Procedure: LAPAROSCOPY DIAGNOSTIC;  Surgeon: Roselle Locus II;  Location: [facility];  Service: Gynecology;  Laterality: N/A;   MAXIMUM ACCESS (MAS)POSTERIOR LUMBAR INTERBODY FUSION (PLIF) 2 LEVEL N/A 05/03/2016   Procedure: L4-5 L5-S1 Maximum access posterior lumbar interbody fusion;  Surgeon: Maeola Harman, MD;  Location: [facility] NEURO ORS;  Service: Neurosurgery;  Laterality: N/A;  L4-5 L5-S1 Maximum access posterior lumbar interbody fusion   SALPINGOOPHORECTOMY  09/07/2011   Procedure: SALPINGO OOPHERECTOMY;  Surgeon: Roselle Locus II;  Location: [facility];  Service: Gynecology;  Laterality: Right;   THYROIDECTOMY, PARTIAL  09-05-11   TOTAL HIP ARTHROPLASTY Right 08/21/2018   Procedure: TOTAL HIP ARTHROPLASTY ANTERIOR APPROACH;  Surgeon: Marcene Corning, MD;  Location: [facility] OR;  Service: Orthopedics;  Laterality: Right;   TUBAL LIGATION  1983    UMBILICAL HERNIA REPAIR  yrs ago   VENTRAL HERNIA REPAIR  06/20/2012   Procedure: LAPAROSCOPIC VENTRAL HERNIA;  Surgeon: Mariella Saa, MD;  Location: [facility];  Service: General;  Laterality: N/A;  Laparoscopic Repair of Ventral Hernia with mesh   VENTRAL HERNIA REPAIR N/A 12/13/2013   Procedure: LAPAROSCOPIC REPAIR RECURRENT VENTRAL INCISIONAL  HERNIA;  Surgeon: Mariella Saa, MD;  Location: [facility];  Service: General;  Laterality: N/A;    Allergies  Allergies  Allergen Reactions   Dilaudid [Hydromorphone Hcl] Shortness Of Breath, Nausea And Vomiting and Other (See Comments)    Able to take morphine without issue   Shellfish Allergy Shortness Of Breath and Rash    Only shrimp allergy   Aggrenox [Aspirin-Dipyridamole Er] Nausea And Vomiting and Other (See Comments)    Headache    Codeine Nausea And Vomiting   Doxycycline Monohydrate Diarrhea   Cephalexin Itching and Rash   Codeine Phosphate  Nausea And Vomiting   Hydrocodone Nausea And Vomiting and Other (See Comments)    Can take morphine without issue   Meperidine Hcl Nausea And Vomiting   Percocet [Oxycodone-Acetaminophen] Nausea And Vomiting and Other (See Comments)    Can take morphine without issue    Home Medications    Prior to Admission medications   Medication Sig Start Date End Date Taking? Authorizing Provider  atorvastatin (LIPITOR) 40 MG tablet TAKE 1 TABLET BY MOUTH EVERY DAY 02/17/23   Nafziger, Kandee Keen, NP  butalbital-acetaminophen-caffeine (FIORICET) 50-325-40 MG tablet TAKE 1 TABLET BY MOUTH EVERY 6 HOURS AS NEEDED FOR HEADACHE 12/02/22   Nelwyn Salisbury, MD  clopidogrel (PLAVIX) 75 MG tablet TAKE 1 TABLET (75 MG TOTAL) BY MOUTH DAILY. *LABS REQUIRED FOR FUTURE REFILLS* 02/24/23   Nelwyn Salisbury, MD  levothyroxine (SYNTHROID) 125 MCG tablet TAKE 1 TABLET BY MOUTH EVERY DAY BEFORE BREAKFAST 02/06/23   Nelwyn Salisbury, MD  losartan (COZAAR) 100 MG tablet TAKE 1 TABLET BY MOUTH EVERY DAY 10/10/22   Lewayne Bunting,  MD  metoprolol succinate (TOPROL-XL) 25 MG 24 hr tablet TAKE 1 TABLET BY MOUTH EVERY DAY. (NEED APPT FOR REFILLS) 06/14/22   Azalee Course, PA  traMADol (ULTRAM) 50 MG tablet TAKE 1 TO 2 TABLETS BY MOUTH EVERY 4 TO 6 HOURS AS NEEDED FOR PAIN 12/14/22   Nelwyn Salisbury, MD    Physical Exam    Vital Signs:  Abigail [name] does not have vital signs available for review today.  Given telephonic nature of communication, physical exam is limited. AAOx3. NAD. Normal affect.  Speech and respirations are unlabored.  Accessory Clinical Findings    None  Assessment & Plan    1.  Preoperative Cardiovascular Risk Assessment: According to the Revised Cardiac Risk Index (RCRI), her Perioperative Risk of Major Cardiac Event is (%): 6.6. Her Functional Capacity in METs is: 5.07 according to the Duke Activity Status Index (DASI). The patient is doing well from a cardiac perspective. Therefore, based on ACC/AHA guidelines, the patient would be at acceptable risk for the planned procedure without further cardiovascular testing.   The patient was advised that if she develops new symptoms prior to surgery to contact our office to arrange for a follow-up visit, and she verbalized understanding.  Patient's Plavix is prescribed by PCP for history of CVA. Recommendations for holding Plavix will need to come from prescribing provider.   A copy of this note will be routed to requesting surgeon.  Time:   Today, I have spent 10 minutes with the patient with telehealth technology discussing medical history, symptoms, and management plan.    Levi Aland, NP-C  03/02/2023, 2:03 PM [address] Office [phone] Fax [phone]

## 2023-03-02 ENCOUNTER — Ambulatory Visit: Payer: Medicare PPO | Attending: Cardiology | Admitting: Nurse Practitioner

## 2023-03-02 ENCOUNTER — Encounter: Payer: Self-pay | Admitting: Nurse Practitioner

## 2023-03-02 DIAGNOSIS — Z01810 Encounter for preprocedural cardiovascular examination: Secondary | ICD-10-CM

## 2023-03-06 ENCOUNTER — Other Ambulatory Visit: Payer: Self-pay | Admitting: Adult Health

## 2023-03-06 DIAGNOSIS — E7800 Pure hypercholesterolemia, unspecified: Secondary | ICD-10-CM

## 2023-03-16 ENCOUNTER — Other Ambulatory Visit: Payer: Self-pay | Admitting: Adult Health

## 2023-03-16 DIAGNOSIS — E7800 Pure hypercholesterolemia, unspecified: Secondary | ICD-10-CM

## 2023-03-21 ENCOUNTER — Telehealth: Payer: Self-pay | Admitting: Cardiology

## 2023-03-21 NOTE — Telephone Encounter (Signed)
Spoke to the pt, on 6/4 pt called requesting amlodipine d/c'd due to edema. Pt stated for the past 4 days her bp is between 160's/90's. For the 2 weeks  experiencing mild shortness of breath and rapid HR for the past 2 days. Pt will like to restart amlodipine 10 mg daily. Unavailable appointment with APP's. Explained ED precaution pt voiced understanding. Will forward to MD and nurse for advise.

## 2023-03-21 NOTE — Telephone Encounter (Signed)
Patient was calling to speak with Dr. Jens Som or nurse. Did not give info on topic

## 2023-03-21 NOTE — Telephone Encounter (Signed)
Spoke with pt, Aware of dr crenshaw's recommendations.  °

## 2023-03-27 ENCOUNTER — Other Ambulatory Visit: Payer: Self-pay | Admitting: Family Medicine

## 2023-03-27 NOTE — Pre-Procedure Instructions (Addendum)
Surgical Instructions  Your procedure is scheduled on April 05, 2023. Report to [facility] Main Entrance "A" at 6:30 A.M., then check in with the Admitting office. Any questions or running late day of surgery: call [phone]  Questions prior to your surgery date: call [phone], Monday-Friday, 8am-4pm. If you experience any cold or flu symptoms such as cough, fever, chills, shortness of breath, etc. between now and your scheduled surgery, please notify us at the above number.    Remember:  Do not eat after midnight the night before your surgery  You may drink clear liquids until 5:30am the morning of your surgery.   Clear liquids allowed are: Water, Non-Citrus Juices (without pulp), Carbonated Beverages, Clear Tea, Black Coffee Only (NO MILK, CREAM OR POWDERED CREAMER of any kind), and Gatorade.  Enhanced Recovery after Surgery for Orthopedics Enhanced Recovery after Surgery is a protocol used to improve the stress on your body and your recovery after surgery.  Patient Instructions  The day of surgery (if you do NOT have diabetes):  Drink ONE (1) Pre-Surgery Clear Ensure by 4:30 am the morning of surgery   This drink was given to you during your hospital  pre-op appointment visit. Nothing else to drink after completing the  Pre-Surgery Clear Ensure.         If you have questions, please contact your surgeon's office.  Take these medicines the morning of surgery with A SIP OF WATER : Amlodipine (Norvasc) Atorvastatin (Lipitor) Levothyroxine (Synthroid) Metoprolol Succinate (Toprol-XL)  If needed: Butalbital-Acetaminophen-Caffeine (Fioricet) Tramadol (Ultram)  Follow your Doctor's instructions when to STOP your Clopidogrel (PLAVIX). If no instructions were received, you need to call the prescribing doctor to obtain instructions.  One week prior to surgery, STOP taking any Aspirin (unless otherwise instructed by your surgeon) Aleve, Naproxen, Ibuprofen, Motrin, Advil,  Goody's, BC's, all herbal medications, fish oil, and non-prescription vitamins.                    Do NOT Smoke (Tobacco/Vaping) for 24 hours prior to your procedure.  If you use a CPAP at night, you may bring your mask/headgear for your overnight stay.   You will be asked to remove any contacts, glasses, piercing's, hearing aid's, dentures/partials prior to surgery. Please bring cases for these items if needed.    Patients discharged the day of surgery will not be allowed to drive home, and someone needs to stay with them for 24 hours.  SURGICAL WAITING ROOM VISITATION Patients may have no more than 2 support people in the waiting area - these visitors may rotate.   Pre-op nurse will coordinate an appropriate time for 1 ADULT support person, who may not rotate, to accompany patient in pre-op.  Children under the age of 64 must have an adult with them who is not the patient and must remain in the main waiting area with an adult.  If the patient needs to stay at the hospital during part of their recovery, the visitor guidelines for inpatient rooms apply.  Please refer to the [facility] website for the visitor guidelines for any additional information.  If you received a COVID test during your pre-op visit  it is requested that you wear a mask when out in public, stay away from anyone that may not be feeling well and notify your surgeon if you develop symptoms. If you have been in contact with anyone that has tested positive in the last 10 days please notify you surgeon.  Pre-operative 5 CHG Bath Instructions   You can play a key role in reducing the risk of infection after surgery. Your skin needs to be as free of germs as possible. You can reduce the number of germs on your skin by washing with CHG (chlorhexidine gluconate) soap before surgery. CHG is an antiseptic soap that kills germs and continues to kill germs even after washing.   DO NOT use if you have an allergy to  chlorhexidine/CHG or antibacterial soaps. If your skin becomes reddened or irritated, stop using the CHG and notify one of our RNs at [phone].   Please shower with the CHG soap starting 4 days before surgery using the following schedule:     Please keep in mind the following:  DO NOT shave, including legs and underarms, starting the day of your first shower.   You may shave your face at any point before/day of surgery.  Place clean sheets on your bed the day you start using CHG soap. Use a clean washcloth (not used since being washed) for each shower. DO NOT sleep with pets once you start using the CHG.   CHG Shower Instructions:  If you choose to wash your hair and private area, wash first with your normal shampoo/soap.  After you use shampoo/soap, rinse your hair and body thoroughly to remove shampoo/soap residue.  Turn the water OFF and apply about 3 tablespoons (45 ml) of CHG soap to a CLEAN washcloth.  Apply CHG soap ONLY FROM YOUR NECK DOWN TO YOUR TOES (washing for 3-5 minutes)  DO NOT use CHG soap on face, private areas, open wounds, or sores.  Pay special attention to the area where your surgery is being performed.  If you are having back surgery, having someone wash your back for you may be helpful. Wait 2 minutes after CHG soap is applied, then you may rinse off the CHG soap.  Pat dry with a clean towel  Put on clean clothes/pajamas   If you choose to wear lotion, please use ONLY the CHG-compatible lotions on the back of this paper.   Additional instructions for the day of surgery: DO NOT APPLY any lotions, deodorants, cologne, or perfumes.   Do not bring valuables to the hospital. [facility] is not responsible for any belongings/valuables. Do not wear nail polish, gel polish, artificial nails, or any other type of covering on natural nails (fingers and toes) Do not wear jewelry or makeup Put on clean/comfortable clothes.  Please brush your teeth.  Ask your nurse  before applying any prescription medications to the skin.     CHG Compatible Lotions   Aveeno Moisturizing lotion  Cetaphil Moisturizing Cream  Cetaphil Moisturizing Lotion  Clairol Herbal Essence Moisturizing Lotion, Dry Skin  Clairol Herbal Essence Moisturizing Lotion, Extra Dry Skin  Clairol Herbal Essence Moisturizing Lotion, Normal Skin  Curel Age Defying Therapeutic Moisturizing Lotion with Alpha Hydroxy  Curel Extreme Care Body Lotion  Curel Soothing Hands Moisturizing Hand Lotion  Curel Therapeutic Moisturizing Cream, Fragrance-Free  Curel Therapeutic Moisturizing Lotion, Fragrance-Free  Curel Therapeutic Moisturizing Lotion, Original Formula  Eucerin Daily Replenishing Lotion  Eucerin Dry Skin Therapy Plus Alpha Hydroxy Crme  Eucerin Dry Skin Therapy Plus Alpha Hydroxy Lotion  Eucerin Original Crme  Eucerin Original Lotion  Eucerin Plus Crme Eucerin Plus Lotion  Eucerin TriLipid Replenishing Lotion  Keri Anti-Bacterial Hand Lotion  Keri Deep Conditioning Original Lotion Dry Skin Formula Softly Scented  Keri Deep Conditioning Original Lotion, Fragrance Free Sensitive Skin Formula  Keri Lotion Fast [employer] Fragrance Free Sensitive Skin Formula  Keri Lotion Fast Absorbing Softly Scented Dry Skin Formula  Keri Original Lotion  Keri Skin Renewal Lotion Keri Silky Smooth Lotion  Keri Silky Smooth Sensitive Skin Lotion  Nivea Body Creamy Conditioning Oil  Nivea Body Extra Enriched Lotion  Nivea Body Original Lotion  Nivea Body Sheer Moisturizing Lotion Nivea Crme  Nivea Skin Firming Lotion  NutraDerm 30 Skin Lotion  NutraDerm Skin Lotion  NutraDerm Therapeutic Skin Cream  NutraDerm Therapeutic Skin Lotion  ProShield Protective Hand Cream  Provon moisturizing lotion  Please read over the following fact sheets that you were given.

## 2023-03-28 ENCOUNTER — Encounter (HOSPITAL_COMMUNITY)
Admission: RE | Admit: 2023-03-28 | Discharge: 2023-03-28 | Disposition: A | Discharge status: Home / Self Care | Payer: Medicare PPO | Source: Ambulatory Visit | Attending: Orthopedic Surgery | Admitting: Orthopedic Surgery

## 2023-03-28 ENCOUNTER — Other Ambulatory Visit: Payer: Self-pay

## 2023-03-28 ENCOUNTER — Encounter (HOSPITAL_COMMUNITY): Payer: Self-pay

## 2023-03-28 VITALS — BP 143/75 | HR 58 | Temp 98.4°F | Resp 18 | Ht 65.5 in | Wt 205.0 lb

## 2023-03-28 DIAGNOSIS — I639 Cerebral infarction, unspecified: Secondary | ICD-10-CM | POA: Insufficient documentation

## 2023-03-28 DIAGNOSIS — I10 Essential (primary) hypertension: Secondary | ICD-10-CM | POA: Insufficient documentation

## 2023-03-28 DIAGNOSIS — I2510 Atherosclerotic heart disease of native coronary artery without angina pectoris: Secondary | ICD-10-CM | POA: Diagnosis not present

## 2023-03-28 DIAGNOSIS — Z01812 Encounter for preprocedural laboratory examination: Secondary | ICD-10-CM | POA: Insufficient documentation

## 2023-03-28 DIAGNOSIS — Z01818 Encounter for other preprocedural examination: Secondary | ICD-10-CM

## 2023-03-28 LAB — BASIC METABOLIC PANEL
Anion gap: 13 (ref 5–15)
BUN: 14 mg/dL (ref 8–23)
CO2: 24 mmol/L (ref 22–32)
Calcium: 9.7 mg/dL (ref 8.9–10.3)
Chloride: 104 mmol/L (ref 98–111)
Creatinine, Ser: 0.95 mg/dL (ref 0.44–1.00)
GFR, Estimated: >60 mL/min (ref >60)
Glucose, Bld: 117 mg/dL — ABNORMAL HIGH (ref 70–99)
Potassium: 3.6 mmol/L (ref 3.5–5.1)
Sodium: 141 mmol/L (ref 135–145)

## 2023-03-28 LAB — CBC
HCT: 42.1 % (ref 36.0–46.0)
Hemoglobin: 13.7 g/dL (ref 12.0–15.0)
MCH: 29.8 pg (ref 26.0–34.0)
MCHC: 32.5 g/dL (ref 30.0–36.0)
MCV: 91.5 fL (ref 80.0–100.0)
Platelets: 170 10*3/uL (ref 150–400)
RBC: 4.6 MIL/uL (ref 3.87–5.11)
RDW: 12.9 % (ref 11.5–15.5)
WBC: 5.3 10*3/uL (ref 4.0–10.5)
nRBC: 0 % (ref 0.0–0.2)

## 2023-03-28 LAB — TYPE AND SCREEN
ABO/RH(D): O POS
Antibody Screen: NEGATIVE

## 2023-03-28 LAB — SURGICAL PCR SCREEN
MRSA, PCR: NEGATIVE
Staphylococcus aureus: NEGATIVE

## 2023-03-28 NOTE — Telephone Encounter (Signed)
Pt LOV was on 01/06/23 Last refill was done on 12/02/22 Please advise

## 2023-03-28 NOTE — Progress Notes (Signed)
PCP - Dr. Gershon Crane Cardiologist - Dr. [name]  PPM/ICD - denies  Chest x-ray - n/a EKG - 08/30/2022 Stress Test - 05/22/2015 ECHO - 12/10/2017 Cardiac Cath - 06/01/2015  Sleep Study - Used to have, had surgery 20 years ago and no longer diagnosed with sleep apnea CPAP - denies  Non-diabetic  Blood Thinner Instructions: Plavix, last dose 7 days prior to surgery 03/28/2023 Aspirin Instructions: denies  ERAS Protcol -Yes, until 0430 PRE-SURGERY Ensure   COVID TEST- n/a  Anesthesia review: Yes. Angina, HTN, stroke, sleep apnea  Patient denies shortness of breath, fever, cough and chest pain at PAT appointment   All instructions explained to the patient, with a verbal understanding of the material. Patient agrees to go over the instructions while at home for a better understanding. Patient also instructed to self quarantine after being tested for COVID-19. The opportunity to ask questions was provided.

## 2023-03-29 NOTE — Anesthesia Preprocedure Evaluation (Addendum)
Anesthesia Evaluation  Patient identified by MRN, date of birth, ID band Patient awake    Reviewed: Allergy & Precautions, NPO status , Patient's Chart, lab work & pertinent test results, reviewed documented beta blocker date and time   History of Anesthesia Complications (+) PONV and history of anesthetic complications  Airway Mallampati: II  TM Distance: >3 FB Neck ROM: Full    Dental  (+) Chipped, Dental Advisory Given, Missing   Pulmonary shortness of breath and with exertion, sleep apnea , pneumonia, resolved, former smoker   breath sounds clear to auscultation       Cardiovascular hypertension, Pt. on medications and Pt. on home beta blockers + angina   Rhythm:Regular Rate:Normal  Bilateral carotid stenosis Non obstructive CAD   Neuro/Psych  Headaches PSYCHIATRIC DISORDERS Anxiety Depression    Gait/Balance difficulties  Neuromuscular disease CVA, Residual Symptoms    GI/Hepatic negative GI ROS, Neg liver ROS,,,  Endo/Other  Hypothyroidism  Morbid obesityHyperlipidemia  Renal/GU negative Renal ROS  negative genitourinary   Musculoskeletal  (+) Arthritis , Osteoarthritis,  Lumbar spinal stenosis Neurogenic claudication   Abdominal  (+) + obese  Peds  Hematology Plavix therapy- last dose 7/9   Anesthesia Other Findings   Reproductive/Obstetrics                              Anesthesia Physical Anesthesia Plan  ASA: 3  Anesthesia Plan: General   Post-op Pain Management: Dilaudid IV   Induction: Intravenous  PONV Risk Score and Plan: 4 or greater and Treatment may vary due to age or medical condition, Ondansetron and Dexamethasone  Airway Management Planned: Oral ETT  Additional Equipment: Arterial line  Intra-op Plan:   Post-operative Plan: Extubation in OR  Informed Consent: I have reviewed the patients History and Physical, chart, labs and discussed the procedure  including the risks, benefits and alternatives for the proposed anesthesia with the patient or authorized representative who has indicated his/her understanding and acceptance.     Dental advisory given  Plan Discussed with: CRNA and Surgeon  Anesthesia Plan Comments: (PAT note by Antionette Poles, PA-C: 77 yo female who follows with cardiology for hx of HTN, CVA, noted to have PFO on TEE in 2006 following CVA, cardiac catheterization September 2016 showed nonobstructive coronary disease, mild bilateral carotid stenosis, coronary calcium score of 58 on CT January 2024 with mild stenosis in the second diagonal and right coronary artery, palpitations. Seen 03/02/23 by Eligha Bridegroom, NP 03/02/23 for preop eval. Per note, "Preoperative Cardiovascular Risk Assessment: According to the Revised Cardiac Risk Index (RCRI), her Perioperative Risk of Major Cardiac Event is (%): 6.6. Her Functional Capacity in METs is: 5.07 according to the Duke Activity Status Index (DASI). The patient is doing well from a cardiac perspective. 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 Recent carotid duplex showed no significant stenosis.   Hx of OSA, no longer uses CPAP after surgical correction 2012.  Pt reports LD Plavix 03/28/23.  Preop labs reviewed, WNL.  EKG 08/30/22: Sinus bradycardia. Rate 59. Minimal voltage criteria for LVH.  Carotid duplex 02/14/23: Summary:  Right Carotid: Velocities in the right ICA are consistent with a 1-39%  stenosis.   Left Carotid: Velocities in  the left ICA are consistent with a 1-39%  stenosis.   Vertebrals: Bilateral vertebral arteries demonstrate antegrade flow.  Subclavians: Normal flow hemodynamics were  seen in bilateral subclavian               arteries.   Coronary CTA 09/28/22: IMPRESSION: 1. Coronary calcium score of 58. This was 77th percentile for age-, sex, and race-matched controls.   2. Normal coronary origin with right dominance.   3. Mild mixed density plaque in the second diagonal (25-49%).   4. Patent LCX.   5. Mild non-calcified plaque (25-49%) in the mid RCA.  TTE 12/07/2017: - Left ventricle: The cavity size was normal. There was mild focal    basal hypertrophy of the septum. Systolic function was vigorous.    The estimated ejection fraction was in the range of 65% to 70%.    Wall motion was normal; there were no regional wall motion    abnormalities. Doppler parameters are consistent with abnormal    left ventricular relaxation (grade 1 diastolic dysfunction).  - Aortic valve: Trileaflet; mildly thickened, mildly calcified    leaflets.   )         Anesthesia Quick Evaluation

## 2023-03-29 NOTE — Progress Notes (Signed)
Anesthesia Chart Review:  77 yo female who follows with cardiology for hx of HTN, CVA, noted to have PFO on TEE in 2006 following CVA, cardiac catheterization September 2016 showed nonobstructive coronary disease, mild bilateral carotid stenosis, coronary calcium score of 58 on CT January 2024 with mild stenosis in the second diagonal and right coronary artery, palpitations. Seen 03/02/23 by Eligha Bridegroom, NP 03/02/23 for preop eval. Per note, "Preoperative Cardiovascular Risk Assessment: According to the Revised Cardiac Risk Index (RCRI), her Perioperative Risk of Major Cardiac Event is (%): 6.6. Her Functional Capacity in METs is: 5.07 according to the Duke Activity Status Index (DASI). The patient is doing well from a cardiac perspective. 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 Recent carotid duplex showed no significant stenosis.   Hx of OSA, no longer uses CPAP after surgical correction 2012.  Pt reports LD Plavix 03/28/23.  Preop labs reviewed, WNL.  EKG 08/30/22: Sinus bradycardia. Rate 59. Minimal voltage criteria for LVH.  Carotid duplex 02/14/23: Summary:  Right Carotid: Velocities in the right ICA are consistent with a 1-39%  stenosis.   Left Carotid: Velocities in the left ICA are consistent with a 1-39%  stenosis.   Vertebrals: Bilateral vertebral arteries demonstrate antegrade flow.  Subclavians: Normal flow hemodynamics were seen in bilateral subclavian               arteries.   Coronary CTA 09/28/22: IMPRESSION: 1. Coronary calcium score of 58. This was 77th percentile for age-, sex, and race-matched controls.   2. Normal coronary origin with right dominance.   3. Mild mixed density plaque in  the second diagonal (25-49%).   4. Patent LCX.   5. Mild non-calcified plaque (25-49%) in the mid RCA.  TTE 12/07/2017: - Left ventricle: The cavity size was normal. There was mild focal    basal hypertrophy of the septum. Systolic function was vigorous.    The estimated ejection fraction was in the range of 65% to 70%.    Wall motion was normal; there were no regional wall motion    abnormalities. Doppler parameters are consistent with abnormal    left ventricular relaxation (grade 1 diastolic dysfunction).  - Aortic valve: Trileaflet; mildly thickened, mildly calcified    leaflets.    Zannie Cove [facility] Short Stay Center/Anesthesiology Phone [phone] 03/29/2023 2:05 PM

## 2023-04-05 ENCOUNTER — Other Ambulatory Visit: Payer: Self-pay

## 2023-04-05 ENCOUNTER — Encounter (HOSPITAL_COMMUNITY)
Admission: RE | Disposition: A | Discharge status: Home / Self Care | Payer: Self-pay | Source: Home / Self Care | Attending: Orthopedic Surgery

## 2023-04-05 ENCOUNTER — Inpatient Hospital Stay (HOSPITAL_COMMUNITY)
Admission: RE | Admit: 2023-04-05 | Discharge: 2023-04-07 | DRG: 454 | Disposition: A | Discharge status: Home / Self Care | Payer: Medicare PPO | Attending: Orthopedic Surgery | Admitting: Orthopedic Surgery

## 2023-04-05 ENCOUNTER — Inpatient Hospital Stay (HOSPITAL_COMMUNITY): Payer: Medicare PPO

## 2023-04-05 ENCOUNTER — Encounter (HOSPITAL_COMMUNITY): Payer: Self-pay | Admitting: Orthopedic Surgery

## 2023-04-05 ENCOUNTER — Inpatient Hospital Stay (HOSPITAL_COMMUNITY): Payer: Medicare PPO | Admitting: Physician Assistant

## 2023-04-05 DIAGNOSIS — Z87891 Personal history of nicotine dependence: Secondary | ICD-10-CM

## 2023-04-05 DIAGNOSIS — G4730 Sleep apnea, unspecified: Secondary | ICD-10-CM | POA: Diagnosis present

## 2023-04-05 DIAGNOSIS — E785 Hyperlipidemia, unspecified: Secondary | ICD-10-CM | POA: Diagnosis present

## 2023-04-05 DIAGNOSIS — Z96641 Presence of right artificial hip joint: Secondary | ICD-10-CM | POA: Diagnosis present

## 2023-04-05 DIAGNOSIS — F32A Depression, unspecified: Secondary | ICD-10-CM | POA: Diagnosis not present

## 2023-04-05 DIAGNOSIS — Z823 Family history of stroke: Secondary | ICD-10-CM

## 2023-04-05 DIAGNOSIS — Z886 Allergy status to analgesic agent status: Secondary | ICD-10-CM

## 2023-04-05 DIAGNOSIS — Q2112 Patent foramen ovale: Secondary | ICD-10-CM

## 2023-04-05 DIAGNOSIS — M48062 Spinal stenosis, lumbar region with neurogenic claudication: Secondary | ICD-10-CM | POA: Diagnosis not present

## 2023-04-05 DIAGNOSIS — I69311 Memory deficit following cerebral infarction: Secondary | ICD-10-CM | POA: Diagnosis not present

## 2023-04-05 DIAGNOSIS — Z8701 Personal history of pneumonia (recurrent): Secondary | ICD-10-CM

## 2023-04-05 DIAGNOSIS — M47812 Spondylosis without myelopathy or radiculopathy, cervical region: Secondary | ICD-10-CM | POA: Diagnosis present

## 2023-04-05 DIAGNOSIS — M4186 Other forms of scoliosis, lumbar region: Secondary | ICD-10-CM | POA: Diagnosis not present

## 2023-04-05 DIAGNOSIS — E890 Postprocedural hypothyroidism: Secondary | ICD-10-CM | POA: Diagnosis not present

## 2023-04-05 DIAGNOSIS — Z91013 Allergy to seafood: Secondary | ICD-10-CM

## 2023-04-05 DIAGNOSIS — M5416 Radiculopathy, lumbar region: Secondary | ICD-10-CM

## 2023-04-05 DIAGNOSIS — M4156 Other secondary scoliosis, lumbar region: Secondary | ICD-10-CM | POA: Diagnosis not present

## 2023-04-05 DIAGNOSIS — Z8249 Family history of ischemic heart disease and other diseases of the circulatory system: Secondary | ICD-10-CM | POA: Diagnosis not present

## 2023-04-05 DIAGNOSIS — Z90710 Acquired absence of both cervix and uterus: Secondary | ICD-10-CM

## 2023-04-05 DIAGNOSIS — Z8619 Personal history of other infectious and parasitic diseases: Secondary | ICD-10-CM

## 2023-04-05 DIAGNOSIS — M48061 Spinal stenosis, lumbar region without neurogenic claudication: Principal | ICD-10-CM | POA: Diagnosis present

## 2023-04-05 DIAGNOSIS — I69393 Ataxia following cerebral infarction: Secondary | ICD-10-CM | POA: Diagnosis not present

## 2023-04-05 DIAGNOSIS — M4316 Spondylolisthesis, lumbar region: Secondary | ICD-10-CM | POA: Diagnosis not present

## 2023-04-05 DIAGNOSIS — M79606 Pain in leg, unspecified: Secondary | ICD-10-CM | POA: Diagnosis present

## 2023-04-05 DIAGNOSIS — E039 Hypothyroidism, unspecified: Secondary | ICD-10-CM | POA: Diagnosis not present

## 2023-04-05 DIAGNOSIS — Z7902 Long term (current) use of antithrombotics/antiplatelets: Secondary | ICD-10-CM

## 2023-04-05 DIAGNOSIS — H9191 Unspecified hearing loss, right ear: Secondary | ICD-10-CM | POA: Diagnosis present

## 2023-04-05 DIAGNOSIS — Z86010 Personal history of colonic polyps: Secondary | ICD-10-CM | POA: Diagnosis not present

## 2023-04-05 DIAGNOSIS — I10 Essential (primary) hypertension: Secondary | ICD-10-CM | POA: Diagnosis not present

## 2023-04-05 DIAGNOSIS — Z881 Allergy status to other antibiotic agents status: Secondary | ICD-10-CM

## 2023-04-05 DIAGNOSIS — Z981 Arthrodesis status: Secondary | ICD-10-CM

## 2023-04-05 DIAGNOSIS — Z885 Allergy status to narcotic agent status: Secondary | ICD-10-CM

## 2023-04-05 DIAGNOSIS — Z0189 Encounter for other specified special examinations: Secondary | ICD-10-CM | POA: Diagnosis not present

## 2023-04-05 DIAGNOSIS — Z79899 Other long term (current) drug therapy: Secondary | ICD-10-CM

## 2023-04-05 DIAGNOSIS — M1611 Unilateral primary osteoarthritis, right hip: Secondary | ICD-10-CM | POA: Diagnosis not present

## 2023-04-05 DIAGNOSIS — Z9851 Tubal ligation status: Secondary | ICD-10-CM | POA: Diagnosis not present

## 2023-04-05 DIAGNOSIS — Z7984 Long term (current) use of oral hypoglycemic drugs: Secondary | ICD-10-CM

## 2023-04-05 DIAGNOSIS — Z9049 Acquired absence of other specified parts of digestive tract: Secondary | ICD-10-CM

## 2023-04-05 DIAGNOSIS — I82401 Acute embolism and thrombosis of unspecified deep veins of right lower extremity: Secondary | ICD-10-CM | POA: Diagnosis not present

## 2023-04-05 HISTORY — PX: ANTERIOR LAT LUMBAR FUSION: SHX1168

## 2023-04-05 SURGERY — ANTERIOR LATERAL LUMBAR FUSION 2 LEVELS
Anesthesia: General | Laterality: Left

## 2023-04-05 MED ORDER — EPHEDRINE SULFATE-NACL 50-0.9 MG/10ML-% IV SOSY
PREFILLED_SYRINGE | INTRAVENOUS | Status: DC | PRN
  Administered 2023-04-05: 10 mg via INTRAVENOUS
  Administered 2023-04-05: 5 mg via INTRAVENOUS

## 2023-04-05 MED ORDER — ORAL CARE MOUTH RINSE: 15.0000 mL | Freq: Once | OROMUCOSAL | Status: AC

## 2023-04-05 MED ORDER — TRAMADOL HCL 50 MG PO TABS
50.0000 mg | ORAL_TABLET | Freq: Four times a day (QID) | ORAL | Status: DC | PRN
  Administered 2023-04-05 – 2023-04-07 (×5): 100 mg via ORAL
  Filled 2023-04-05 (×5): qty 2

## 2023-04-05 MED ORDER — SUCCINYLCHOLINE CHLORIDE 200 MG/10ML IV SOSY
PREFILLED_SYRINGE | INTRAVENOUS | Status: DC | PRN
  Administered 2023-04-05: 150 mg via INTRAVENOUS
  Administered 2023-04-05: 120 mg via INTRAVENOUS
  Administered 2023-04-05: 50 mg via INTRAVENOUS

## 2023-04-05 MED ORDER — THROMBIN (RECOMBINANT) 20000 UNITS EX SOLR
CUTANEOUS | Status: AC
  Filled 2023-04-05: qty 20000

## 2023-04-05 MED ORDER — ALUM & MAG HYDROXIDE-SIMETH 200-200-20 MG/5ML PO SUSP: 30.0000 mL | Freq: Four times a day (QID) | ORAL | Status: DC | PRN

## 2023-04-05 MED ORDER — MIDAZOLAM HCL 2 MG/2ML IJ SOLN
INTRAMUSCULAR | Status: AC
  Filled 2023-04-05: qty 2

## 2023-04-05 MED ORDER — 0.9 % SODIUM CHLORIDE (POUR BTL) OPTIME
TOPICAL | Status: DC | PRN
  Administered 2023-04-05: 1000 mL

## 2023-04-05 MED ORDER — METHOCARBAMOL 1000 MG/10ML IJ SOLN: 500.0000 mg | Freq: Four times a day (QID) | INTRAVENOUS | Status: DC | PRN

## 2023-04-05 MED ORDER — PROPOFOL 10 MG/ML IV BOLUS
INTRAVENOUS | Status: DC | PRN
Start: 2023-04-05 — End: 2023-04-05
  Administered 2023-04-05: 50 mg via INTRAVENOUS
  Administered 2023-04-05: 150 mg via INTRAVENOUS
  Administered 2023-04-05: 20 mg via INTRAVENOUS
  Administered 2023-04-05: 50 mg via INTRAVENOUS
  Administered 2023-04-05: 80 mg via INTRAVENOUS
  Administered 2023-04-05: 100 ug/kg/min via INTRAVENOUS
  Administered 2023-04-05: 50 mg via INTRAVENOUS

## 2023-04-05 MED ORDER — LEVOTHYROXINE SODIUM 25 MCG PO TABS
125.0000 ug | ORAL_TABLET | Freq: Every day | ORAL | Status: DC
  Administered 2023-04-06 – 2023-04-07 (×2): 125 ug via ORAL
  Filled 2023-04-05 (×2): qty 1

## 2023-04-05 MED ORDER — BUPIVACAINE-EPINEPHRINE (PF) 0.25% -1:200000 IJ SOLN
INTRAMUSCULAR | Status: AC
  Filled 2023-04-05: qty 30

## 2023-04-05 MED ORDER — LOSARTAN POTASSIUM 50 MG PO TABS: 100.0000 mg | ORAL_TABLET | Freq: Every day | ORAL | Status: DC

## 2023-04-05 MED ORDER — OXYCODONE-ACETAMINOPHEN 5-325 MG PO TABS: 1.0000 | ORAL_TABLET | ORAL | Status: DC | PRN

## 2023-04-05 MED ORDER — LIDOCAINE 2% (20 MG/ML) 5 ML SYRINGE
INTRAMUSCULAR | Status: AC
  Filled 2023-04-05: qty 5

## 2023-04-05 MED ORDER — SUCCINYLCHOLINE CHLORIDE 200 MG/10ML IV SOSY
PREFILLED_SYRINGE | INTRAVENOUS | Status: AC
  Filled 2023-04-05: qty 10

## 2023-04-05 MED ORDER — SODIUM CHLORIDE 0.9 % IV SOLN: 250.0000 mL | INTRAVENOUS | Status: DC

## 2023-04-05 MED ORDER — BUPIVACAINE-EPINEPHRINE 0.25% -1:200000 IJ SOLN
INTRAMUSCULAR | Status: DC | PRN
  Administered 2023-04-05: 9 mL

## 2023-04-05 MED ORDER — MORPHINE SULFATE (PF) 2 MG/ML IV SOLN
INTRAVENOUS | Status: AC
  Filled 2023-04-05: qty 1

## 2023-04-05 MED ORDER — LIDOCAINE 2% (20 MG/ML) 5 ML SYRINGE
INTRAMUSCULAR | Status: DC | PRN
  Administered 2023-04-05: 60 mg via INTRAVENOUS

## 2023-04-05 MED ORDER — POVIDONE-IODINE 7.5 % EX SOLN: Freq: Once | CUTANEOUS | Status: DC

## 2023-04-05 MED ORDER — PROMETHAZINE HCL 12.5 MG PO TABS
12.5000 mg | ORAL_TABLET | ORAL | Status: DC | PRN
  Administered 2023-04-06 – 2023-04-07 (×2): 12.5 mg via ORAL
  Filled 2023-04-05 (×3): qty 1

## 2023-04-05 MED ORDER — PHENYLEPHRINE HCL-NACL 20-0.9 MG/250ML-% IV SOLN
INTRAVENOUS | Status: DC | PRN
  Administered 2023-04-05: 30 ug/min via INTRAVENOUS

## 2023-04-05 MED ORDER — THROMBIN 20000 UNITS EX SOLR: CUTANEOUS | Status: DC | PRN

## 2023-04-05 MED ORDER — SODIUM CHLORIDE 0.9% FLUSH: 3.0000 mL | INTRAVENOUS | Status: DC | PRN

## 2023-04-05 MED ORDER — CEFAZOLIN SODIUM-DEXTROSE 2-4 GM/100ML-% IV SOLN
2.0000 g | INTRAVENOUS | Status: AC
  Administered 2023-04-05: 2 g via INTRAVENOUS
  Filled 2023-04-05: qty 100

## 2023-04-05 MED ORDER — EPHEDRINE 5 MG/ML INJ
INTRAVENOUS | Status: AC
  Filled 2023-04-05: qty 5

## 2023-04-05 MED ORDER — MORPHINE SULFATE (PF) 2 MG/ML IV SOLN
1.0000 mg | INTRAVENOUS | Status: DC | PRN
  Administered 2023-04-05 – 2023-04-07 (×3): 2 mg via INTRAVENOUS
  Filled 2023-04-05 (×3): qty 1

## 2023-04-05 MED ORDER — DOCUSATE SODIUM 100 MG PO CAPS
100.0000 mg | ORAL_CAPSULE | Freq: Two times a day (BID) | ORAL | Status: DC
  Administered 2023-04-05 – 2023-04-07 (×5): 100 mg via ORAL
  Filled 2023-04-05 (×5): qty 1

## 2023-04-05 MED ORDER — FENTANYL CITRATE (PF) 250 MCG/5ML IJ SOLN
INTRAMUSCULAR | Status: DC | PRN
  Administered 2023-04-05: 50 ug via INTRAVENOUS
  Administered 2023-04-05: 100 ug via INTRAVENOUS
  Administered 2023-04-05: 50 ug via INTRAVENOUS
  Administered 2023-04-05: 100 ug via INTRAVENOUS

## 2023-04-05 MED ORDER — ACETAMINOPHEN 10 MG/ML IV SOLN
1000.0000 mg | Freq: Once | INTRAVENOUS | Status: AC
  Administered 2023-04-05: 1000 mg via INTRAVENOUS

## 2023-04-05 MED ORDER — MORPHINE SULFATE (PF) 2 MG/ML IV SOLN
2.0000 mg | INTRAVENOUS | Status: DC | PRN
  Administered 2023-04-05 (×2): 2 mg via INTRAVENOUS

## 2023-04-05 MED ORDER — DEXAMETHASONE SODIUM PHOSPHATE 10 MG/ML IJ SOLN
INTRAMUSCULAR | Status: AC
  Filled 2023-04-05: qty 1

## 2023-04-05 MED ORDER — ACETAMINOPHEN 650 MG RE SUPP: 650.0000 mg | RECTAL | Status: DC | PRN

## 2023-04-05 MED ORDER — ONDANSETRON HCL 4 MG/2ML IJ SOLN
INTRAMUSCULAR | Status: AC
  Filled 2023-04-05: qty 2

## 2023-04-05 MED ORDER — BISACODYL 5 MG PO TBEC: 5.0000 mg | DELAYED_RELEASE_TABLET | Freq: Every day | ORAL | Status: DC | PRN

## 2023-04-05 MED ORDER — ZOLPIDEM TARTRATE 5 MG PO TABS: 5.0000 mg | ORAL_TABLET | Freq: Every evening | ORAL | Status: DC | PRN

## 2023-04-05 MED ORDER — ACETAMINOPHEN 325 MG PO TABS
650.0000 mg | ORAL_TABLET | ORAL | Status: DC | PRN
  Administered 2023-04-05 – 2023-04-07 (×3): 650 mg via ORAL
  Filled 2023-04-05 (×3): qty 2

## 2023-04-05 MED ORDER — ONDANSETRON HCL 4 MG/2ML IJ SOLN: 4.0000 mg | Freq: Four times a day (QID) | INTRAMUSCULAR | Status: DC | PRN

## 2023-04-05 MED ORDER — ONDANSETRON HCL 4 MG PO TABS
4.0000 mg | ORAL_TABLET | Freq: Four times a day (QID) | ORAL | Status: DC | PRN
  Administered 2023-04-07: 4 mg via ORAL
  Filled 2023-04-05: qty 1

## 2023-04-05 MED ORDER — FENTANYL CITRATE (PF) 250 MCG/5ML IJ SOLN
INTRAMUSCULAR | Status: AC
  Filled 2023-04-05: qty 5

## 2023-04-05 MED ORDER — ACETAMINOPHEN 10 MG/ML IV SOLN
INTRAVENOUS | Status: AC
  Filled 2023-04-05: qty 100

## 2023-04-05 MED ORDER — ATORVASTATIN CALCIUM 40 MG PO TABS
40.0000 mg | ORAL_TABLET | Freq: Every day | ORAL | Status: DC
  Administered 2023-04-06 – 2023-04-07 (×2): 40 mg via ORAL
  Filled 2023-04-05 (×2): qty 1

## 2023-04-05 MED ORDER — ONDANSETRON HCL 4 MG/2ML IJ SOLN
INTRAMUSCULAR | Status: DC | PRN
  Administered 2023-04-05: 4 mg via INTRAVENOUS

## 2023-04-05 MED ORDER — ENSURE PRE-SURGERY PO LIQD
296.0000 mL | Freq: Once | ORAL | Status: AC
  Administered 2023-04-06: 296 mL via ORAL
  Filled 2023-04-05: qty 296

## 2023-04-05 MED ORDER — METHADONE HCL 10 MG/ML IJ SOLN
5.0000 mg | Freq: Once | INTRAMUSCULAR | Status: DC | PRN
  Filled 2023-04-05: qty 0.5

## 2023-04-05 MED ORDER — POTASSIUM CHLORIDE IN NACL 20-0.9 MEQ/L-% IV SOLN
INTRAVENOUS | Status: DC
  Filled 2023-04-05: qty 1000

## 2023-04-05 MED ORDER — METOPROLOL SUCCINATE ER 25 MG PO TB24: 25.0000 mg | ORAL_TABLET | Freq: Every day | ORAL | Status: DC

## 2023-04-05 MED ORDER — LACTATED RINGERS IV SOLN: INTRAVENOUS | Status: DC | PRN

## 2023-04-05 MED ORDER — SODIUM CHLORIDE 0.9% FLUSH
3.0000 mL | Freq: Two times a day (BID) | INTRAVENOUS | Status: DC
  Administered 2023-04-05 – 2023-04-06 (×3): 3 mL via INTRAVENOUS

## 2023-04-05 MED ORDER — DEXMEDETOMIDINE HCL IN NACL 80 MCG/20ML IV SOLN
INTRAVENOUS | Status: DC | PRN
  Administered 2023-04-05 (×2): 8 ug via INTRAVENOUS

## 2023-04-05 MED ORDER — DEXAMETHASONE SODIUM PHOSPHATE 10 MG/ML IJ SOLN
INTRAMUSCULAR | Status: DC | PRN
  Administered 2023-04-05: 10 mg via INTRAVENOUS

## 2023-04-05 MED ORDER — CEFAZOLIN SODIUM-DEXTROSE 2-4 GM/100ML-% IV SOLN
2.0000 g | Freq: Three times a day (TID) | INTRAVENOUS | Status: AC
  Administered 2023-04-05 (×2): 2 g via INTRAVENOUS
  Filled 2023-04-05 (×2): qty 100

## 2023-04-05 MED ORDER — PHENOL 1.4 % MT LIQD: 1.0000 | OROMUCOSAL | Status: DC | PRN

## 2023-04-05 MED ORDER — FLEET ENEMA 7-19 GM/118ML RE ENEM: 1.0000 | ENEMA | Freq: Once | RECTAL | Status: DC | PRN

## 2023-04-05 MED ORDER — LACTATED RINGERS IV SOLN: INTRAVENOUS | Status: DC

## 2023-04-05 MED ORDER — MENTHOL 3 MG MT LOZG: 1.0000 | LOZENGE | OROMUCOSAL | Status: DC | PRN

## 2023-04-05 MED ORDER — METHOCARBAMOL 500 MG PO TABS
500.0000 mg | ORAL_TABLET | Freq: Four times a day (QID) | ORAL | Status: DC | PRN
  Administered 2023-04-05 – 2023-04-07 (×6): 500 mg via ORAL
  Filled 2023-04-05 (×6): qty 1

## 2023-04-05 MED ORDER — CHLORHEXIDINE GLUCONATE 0.12 % MT SOLN
15.0000 mL | Freq: Once | OROMUCOSAL | Status: AC
  Administered 2023-04-05: 15 mL via OROMUCOSAL
  Filled 2023-04-05: qty 15

## 2023-04-05 MED ORDER — PROPOFOL 10 MG/ML IV BOLUS
INTRAVENOUS | Status: AC
  Filled 2023-04-05: qty 20

## 2023-04-05 MED ORDER — MIDAZOLAM HCL 2 MG/2ML IJ SOLN
INTRAMUSCULAR | Status: DC | PRN
  Administered 2023-04-05: 1 mg via INTRAVENOUS

## 2023-04-05 MED ORDER — CEFAZOLIN SODIUM-DEXTROSE 2-4 GM/100ML-% IV SOLN
2.0000 g | INTRAVENOUS | Status: AC
  Administered 2023-04-06: 2 g via INTRAVENOUS

## 2023-04-05 MED ORDER — AMLODIPINE BESYLATE 10 MG PO TABS: 10.0000 mg | ORAL_TABLET | Freq: Every day | ORAL | Status: DC

## 2023-04-05 MED ORDER — PHENYLEPHRINE 80 MCG/ML (10ML) SYRINGE FOR IV PUSH (FOR BLOOD PRESSURE SUPPORT)
PREFILLED_SYRINGE | INTRAVENOUS | Status: AC
  Filled 2023-04-05: qty 10

## 2023-04-05 MED ORDER — SENNOSIDES-DOCUSATE SODIUM 8.6-50 MG PO TABS
1.0000 | ORAL_TABLET | Freq: Every evening | ORAL | Status: DC | PRN
  Administered 2023-04-06: 1 via ORAL
  Filled 2023-04-05: qty 1

## 2023-04-05 SURGICAL SUPPLY — 65 items
AGENT HMST KT MTR STRL THRMB (HEMOSTASIS) ×1
BAG COUNTER SPONGE SURGICOUNT (BAG) ×2 IMPLANT
BAG SPNG CNTER NS LX DISP (BAG) ×1
BLADE CLIPPER SURG (BLADE) IMPLANT
BLADE SURG 10 STRL SS (BLADE) ×2 IMPLANT
BONE VIVIGEN FORMABLE 10CC (Bone Implant) ×1 IMPLANT
COVER BACK TABLE 80X110 HD (DRAPES) ×2 IMPLANT
COVER SURGICAL LIGHT HANDLE (MISCELLANEOUS) ×2 IMPLANT
DRAPE C-ARM 42X72 X-RAY (DRAPES) ×2 IMPLANT
DRAPE POUCH INSTRU U-SHP 10X18 (DRAPES) ×2 IMPLANT
DRAPE SURG 17X23 STRL (DRAPES) ×8 IMPLANT
DURAPREP 26ML APPLICATOR (WOUND CARE) ×2 IMPLANT
ELECT BLADE 6.5 EXT (BLADE) ×2 IMPLANT
ELECT CAUTERY BLADE 6.4 (BLADE) ×2 IMPLANT
ELECT REM PT RETURN 9FT ADLT (ELECTROSURGICAL) ×1
ELECTRODE REM PT RTRN 9FT ADLT (ELECTROSURGICAL) ×2 IMPLANT
GAUZE 4X4 16PLY ~~LOC~~+RFID DBL (SPONGE) ×2 IMPLANT
GAUZE SPONGE 4X4 12PLY STRL (GAUZE/BANDAGES/DRESSINGS) IMPLANT
GLOVE BIO SURGEON STRL SZ 6.5 (GLOVE) ×4 IMPLANT
GLOVE BIO SURGEON STRL SZ8 (GLOVE) ×2 IMPLANT
GLOVE BIOGEL PI IND STRL 7.0 (GLOVE) ×2 IMPLANT
GLOVE BIOGEL PI IND STRL 8 (GLOVE) ×2 IMPLANT
GLOVE SURG ENC MOIS LTX SZ6.5 (GLOVE) ×4 IMPLANT
GOWN STRL REUS W/ TWL LRG LVL3 (GOWN DISPOSABLE) ×2 IMPLANT
GOWN STRL REUS W/ TWL XL LVL3 (GOWN DISPOSABLE) ×4 IMPLANT
GOWN STRL REUS W/TWL LRG LVL3 (GOWN DISPOSABLE) ×1
GOWN STRL REUS W/TWL XL LVL3 (GOWN DISPOSABLE) ×2
GRAFT BNE MATRIX VG FRMBL L 10 (Bone Implant) IMPLANT
KIT BASIN OR (CUSTOM PROCEDURE TRAY) ×2 IMPLANT
KIT DILATOR XLIF 5 (KITS) IMPLANT
KIT SURGICAL ACCESS MAXCESS (KITS) IMPLANT
KIT TURNOVER KIT B (KITS) ×2 IMPLANT
MARKER SKIN DUAL TIP RULER LAB (MISCELLANEOUS) ×2 IMPLANT
MODULE EMG NDL SSEP NVM5 (NEUROSURGERY SUPPLIES) IMPLANT
MODULE EMG NEEDLE SSEP NVM5 (NEUROSURGERY SUPPLIES) ×1 IMPLANT
MODULE NVM5 NEXT GEN EMG (NEUROSURGERY SUPPLIES) IMPLANT
NDL HYPO 25GX1X1/2 BEV (NEEDLE) ×2 IMPLANT
NDL SPNL 18GX3.5 QUINCKE PK (NEEDLE) ×2 IMPLANT
NEEDLE HYPO 25GX1X1/2 BEV (NEEDLE) ×1 IMPLANT
NEEDLE SPNL 18GX3.5 QUINCKE PK (NEEDLE) ×1 IMPLANT
NS IRRIG 1000ML POUR BTL (IV SOLUTION) ×2 IMPLANT
PACK LAMINECTOMY ORTHO (CUSTOM PROCEDURE TRAY) ×2 IMPLANT
PACK UNIVERSAL I (CUSTOM PROCEDURE TRAY) ×2 IMPLANT
PAD ARMBOARD 7.5X6 YLW CONV (MISCELLANEOUS) ×4 IMPLANT
PUTTY DBX 5CC (Putty) IMPLANT
SHAFT THRD FUNNEL GL (ORTHOPEDIC DISPOSABLE SUPPLIES) IMPLANT
SPACER RISE-L 18X50 7-14MM (Spacer) IMPLANT
SPACER RISE-L 18X50 8-15 6D (Spacer) IMPLANT
SPONGE INTESTINAL PEANUT (DISPOSABLE) ×4 IMPLANT
SPONGE SURGIFOAM ABS GEL 100 (HEMOSTASIS) IMPLANT
SPONGE T-LAP 4X18 ~~LOC~~+RFID (SPONGE) ×2 IMPLANT
STRIP CLOSURE SKIN 1/2X4 (GAUZE/BANDAGES/DRESSINGS) IMPLANT
SURGIFLO W/THROMBIN 8M KIT (HEMOSTASIS) IMPLANT
SUT MNCRL AB 4-0 PS2 18 (SUTURE) ×2 IMPLANT
SUT VIC AB 0 CT1 18XCR BRD 8 (SUTURE) ×2 IMPLANT
SUT VIC AB 0 CT1 8-18 (SUTURE) ×1
SUT VIC AB 2-0 CT2 18 VCP726D (SUTURE) ×2 IMPLANT
SYR BULB IRRIG 60ML STRL (SYRINGE) ×2 IMPLANT
SYR CONTROL 10ML LL (SYRINGE) ×2 IMPLANT
TOWEL GREEN STERILE (TOWEL DISPOSABLE) ×2 IMPLANT
TOWEL GREEN STERILE FF (TOWEL DISPOSABLE) ×2 IMPLANT
TRAY FOLEY MTR SLVR 16FR STAT (SET/KITS/TRAYS/PACK) ×2 IMPLANT
TRAY FOLEY W/BAG SLVR 14FR (SET/KITS/TRAYS/PACK) IMPLANT
WATER STERILE IRR 1000ML POUR (IV SOLUTION) ×2 IMPLANT
YANKAUER SUCT BULB TIP NO VENT (SUCTIONS) ×2 IMPLANT

## 2023-04-05 NOTE — H&P (Signed)
PREOPERATIVE H&P  Chief Complaint: Progressive bilateral leg pain and weakness  HPI: Abigail [name] is a 77 y.o. female who presents with ongoing pain in the bilateral legs.  The patient also reports progressive weakness in the bilateral legs.  MRI reveals moderate to severe stenosis at L2-3 and L3-4, the levels above the patient's previous fusion spanning L4-S1.  Patient has failed multiple forms of conservative care and continues to have pain (see office notes for additional details regarding the patient's full course of treatment)  Past Medical History:  Diagnosis Date   Bronchitis 09/2016   Bruises easily    DDD (degenerative disc disease)    Degenerative joint disease of cervical spine 09-05-11   Cervival area, now some osteoarthritis-lower back and Rt. shoulder   Depression    Diverticulitis 09-05-11   hx. gastritis, diverticulitis x2 -now surgery planned   DIVERTICULITIS, ACUTE 02/10/2010   Dyspnea    with exertion   Essential hypertension 09-05-11   tx. Verapamil   GROIN PAIN 09/21/2010   Headache(784.0)    headaches are better   Hearing loss    right ear   Heart palpitations    Hemorrhoids    History of bronchitis    History of colonic polyps    History of pneumonia    History of tension headache    Hyperlipidemia    Hypertension    Hypothyroidism 09-05-11   Supplement used   Late effect of adverse effect of drug, medicinal or biological substance    NECK PAIN, ACUTE 09/21/2010   Neuromuscular disorder (HCC)    carpal tunnel in both hands   Nocturia    Osteoarthritis 112-17-12   spine and rt. hip, rt. shoulder   Patent foramen ovale    Small - unable to be closed -    Pneumonia    PONV (postoperative nausea and vomiting)    Sleep apnea 09-05-11   no cpap ever, had surgery to remove cartilage, no problems now   SORE THROAT 04/30/2009   Stroke (HCC) 09-05-11   2006/2009-(loss of memory, balance issues remains occ.)   TIA 09/28/2007, 10/2014   Unstable  angina (HCC)    a. 05/2010 Cath: nl cors, EF 55%;  b. 05/2015 Lexiscan MV: small, severe, fixed apical defect and a small, severe, reversible inf lateral defect w/ apical thinning and mild ischemia, EF 54%.   URI 09/02/2008   Vertigo    Past Surgical History:  Procedure Laterality Date   ABDOMINAL HYSTERECTOMY     BACK SURGERY     CARDIAC CATHETERIZATION N/A 06/01/2015   Procedure: Left Heart Cath and Coronary Angiography;  Surgeon: Kathleene Hazel, MD;  Location: [facility] INVASIVE CV LAB;  Service: Cardiovascular;  Laterality: N/A;   COLON RESECTION  09/07/2011   Procedure: LAPAROSCOPIC SIGMOID COLON RESECTION;  Surgeon: Mariella Saa, MD;  Location: [facility] ORS;  Service: General;  Laterality: N/A;  with proctoscopy   COLONOSCOPY  08/06/2015   per Dr. Kinnie Scales, adenomatous polyps, repeat in 5 yrs    CYST EXCISION N/A 11/29/2016   Procedure: EXCISION UMBILICAL CYST;  Surgeon: Glenna Fellows, MD;  Location: [facility]S;  Service: General;  Laterality: N/A;   ELBOW SURGERY  09-05-11   left elbow -ligament repair   EYE SURGERY     cataract   INSERTION OF MESH N/A 12/13/2013   Procedure: INSERTION OF MESH;  Surgeon: Mariella Saa, MD;  Location: [facility];  Service: General;  Laterality: N/A;   LAPAROSCOPY  09/07/2011   Procedure: LAPAROSCOPY DIAGNOSTIC;  Surgeon: Roselle Locus II;  Location: [facility];  Service: Gynecology;  Laterality: N/A;   MAXIMUM ACCESS (MAS)POSTERIOR LUMBAR INTERBODY FUSION (PLIF) 2 LEVEL N/A 05/03/2016   Procedure: L4-5 L5-S1 Maximum access posterior lumbar interbody fusion;  Surgeon: Maeola Harman, MD;  Location: [facility] NEURO ORS;  Service: Neurosurgery;  Laterality: N/A;  L4-5 L5-S1 Maximum access posterior lumbar interbody fusion   SALPINGOOPHORECTOMY  09/07/2011   Procedure: SALPINGO OOPHERECTOMY;  Surgeon: Roselle Locus II;  Location: [facility];  Service: Gynecology;  Laterality: Right;   THYROIDECTOMY, PARTIAL  09-05-11   TOTAL HIP ARTHROPLASTY Right 08/21/2018    Procedure: TOTAL HIP ARTHROPLASTY ANTERIOR APPROACH;  Surgeon: Marcene Corning, MD;  Location: [facility] OR;  Service: Orthopedics;  Laterality: Right;   TUBAL LIGATION  1983   UMBILICAL HERNIA REPAIR  yrs ago   VENTRAL HERNIA REPAIR  06/20/2012   Procedure: LAPAROSCOPIC VENTRAL HERNIA;  Surgeon: Mariella Saa, MD;  Location: [facility];  Service: General;  Laterality: N/A;  Laparoscopic Repair of Ventral Hernia with mesh   VENTRAL HERNIA REPAIR N/A 12/13/2013   Procedure: LAPAROSCOPIC REPAIR RECURRENT VENTRAL INCISIONAL  HERNIA;  Surgeon: Mariella Saa, MD;  Location: [facility];  Service: General;  Laterality: N/A;   Social History   Socioeconomic History   Marital status: Married    Spouse name: Not on file   Number of children: Not on file   Years of education: Not on file   Highest education level: Not on file  Occupational History   Not on file  Tobacco Use   Smoking status: Former    Current packs/day: 0.00    Average packs/day: 0.5 packs/day for 5.0 years (2.5 ttl pk-yrs)    Types: Cigarettes    Start date: 09/20/1963    Quit date: 09/19/1968    Years since quitting: 54.5   Smokeless tobacco: Never  Vaping Use   Vaping status: Never Used  Substance and Sexual Activity   Alcohol use: Yes    Alcohol/week: 2.0 standard drinks of alcohol    Types: 1 Cans of beer, 1 Shots of liquor per week    Comment: rarely   Drug use: No   Sexual activity: Yes  Other Topics Concern   Not on file  Social History Narrative   Lives in [place] with her husband.  She does not routinely exercise.   Social Determinants of Health   Financial Resource Strain: Not on file  Food Insecurity: Not on file  Transportation Needs: Not on file  Physical Activity: Not on file  Stress: Not on file  Social Connections: Not on file   Family History  Problem Relation Age of Onset   Arrhythmia Mother    Hypertension Mother    Stroke Mother    Stroke Maternal Grandmother    Diabetes Paternal Grandmother     Breast cancer Sister        59s   Breast cancer Sister        56s   Allergies  Allergen Reactions   Dilaudid [Hydromorphone Hcl] Shortness Of Breath, Nausea And Vomiting and Other (See Comments)    Able to take morphine without issue   Shellfish Allergy Shortness Of Breath and Rash    Only shrimp allergy   Aggrenox [Aspirin-Dipyridamole Er] Nausea And Vomiting and Other (See Comments)    Headache    Codeine Nausea And Vomiting   Doxycycline Monohydrate Diarrhea   Cephalexin Itching and Rash   Codeine  Phosphate Nausea And Vomiting   Hydrocodone Nausea And Vomiting and Other (See Comments)    Can take morphine without issue   Meperidine Hcl Nausea And Vomiting   Percocet [Oxycodone-Acetaminophen] Nausea And Vomiting and Other (See Comments)    Can take morphine without issue   Prior to Admission medications   Medication Sig Start Date End Date Taking? Authorizing Provider  amLODipine (NORVASC) 10 MG tablet Take 10 mg by mouth daily.   Yes [provider]  atorvastatin (LIPITOR) 40 MG tablet TAKE 1 TABLET BY MOUTH EVERY DAY 03/17/23  Yes Nelwyn Salisbury, MD  butalbital-acetaminophen-caffeine (FIORICET) 50-325-40 MG tablet TAKE 1 TABLET BY MOUTH EVERY 6 HOURS AS NEEDED FOR HEADACHE 03/28/23  Yes Nelwyn Salisbury, MD  clopidogrel (PLAVIX) 75 MG tablet TAKE 1 TABLET (75 MG TOTAL) BY MOUTH DAILY. *LABS REQUIRED FOR FUTURE REFILLS* 02/24/23  Yes Nelwyn Salisbury, MD  levothyroxine (SYNTHROID) 125 MCG tablet TAKE 1 TABLET BY MOUTH EVERY DAY BEFORE BREAKFAST 02/06/23  Yes Nelwyn Salisbury, MD  losartan (COZAAR) 100 MG tablet TAKE 1 TABLET BY MOUTH EVERY DAY 10/10/22  Yes Lewayne Bunting, MD  metoprolol succinate (TOPROL-XL) 25 MG 24 hr tablet TAKE 1 TABLET BY MOUTH EVERY DAY. (NEED APPT FOR REFILLS) 06/14/22  Yes Lisabeth Devoid, Wynema Birch, PA  traMADol (ULTRAM) 50 MG tablet TAKE 1 TO 2 TABLETS BY MOUTH EVERY 4 TO 6 HOURS AS NEEDED FOR PAIN 12/14/22   Nelwyn Salisbury, MD     All other systems have been  reviewed and were otherwise negative with the exception of those mentioned in the HPI and as above.  Physical Exam: Vitals:   04/05/23 0638  BP: 128/71  Pulse: (!) 58  Resp: 18  Temp: 98.3 F (36.8 C)  SpO2: 96%    Body mass index is 33.59 kg/m.  General: Alert, no acute distress Cardiovascular: No pedal edema Respiratory: No cyanosis, no use of accessory musculature Skin: No lesions in the area of chief complaint Neurologic: Sensation intact distally Psychiatric: Patient is competent for consent with normal mood and affect Lymphatic: No axillary or cervical lymphadenopathy   Assessment/Plan: NEUROGENIC CLAUDICATION, DUE TO MODERATE TO SEVERE STENOSIS AT L2/3 AND L3/4 Plan for Procedure(s): LEFT-SIDED LUMBAR 2- LUMBAR 3, LUMBAR 3- LUMBAR 4 LATERAL INTERBODY FUSION WITH INSTRUMENTATION AND ALLOGRAFT   Jackelyn Hoehn, MD 04/05/2023 8:37 AM

## 2023-04-05 NOTE — Anesthesia Procedure Notes (Signed)
Arterial Line Insertion Start/End7/17/2024 8:10 AM, 04/05/2023 8:15 AM Performed by: Mal Amabile, MD, Kayleen Memos, CRNA, CRNA  Patient location: Pre-op. Preanesthetic checklist: patient identified, IV checked, site marked, risks and benefits discussed, surgical consent, monitors and equipment checked, pre-op evaluation, timeout performed and anesthesia consent Lidocaine 1% used for infiltration radial was placed Catheter size: 20 G  Attempts: 1 Procedure performed using ultrasound guided technique. Ultrasound Notes:anatomy identified, needle tip was noted to be adjacent to the nerve/plexus identified and no ultrasound evidence of intravascular and/or intraneural injection Following insertion, dressing applied and Biopatch. Post procedure assessment: normal  Patient tolerated the procedure well with no immediate complications.

## 2023-04-05 NOTE — Transfer of Care (Signed)
Immediate Anesthesia Transfer of Care Note  Patient: Abigail [name]  Procedure(s) Performed: LEFT-SIDED LUMBAR 2- LUMBAR 3, LUMBAR 3- LUMBAR 4 LATERAL INTERBODY FUSION WITH INSTRUMENTATION AND ALLOGRAFT (Left)  Patient Location: PACU  Anesthesia Type:General  Level of Consciousness: awake and drowsy  Airway & Oxygen Therapy: Patient Spontanous Breathing and Patient connected to face mask oxygen  Post-op Assessment: Report given to RN, Post -op Vital signs reviewed and stable, and Patient moving all extremities  Post vital signs: stable  Last Vitals:  Vitals Value Taken Time  BP 91/55 04/05/23 1145  Temp    Pulse 53 04/05/23 1148  Resp 20 04/05/23 1148  SpO2 98 % 04/05/23 1148  Vitals shown include unfiled device data.  Last Pain:  Vitals:   04/05/23 0657  TempSrc:   PainSc: 0-No pain         Complications: No notable events documented.

## 2023-04-05 NOTE — Anesthesia Postprocedure Evaluation (Signed)
Anesthesia Post Note  Patient: Abigail [name]  Procedure(s) Performed: LEFT-SIDED LUMBAR 2- LUMBAR 3, LUMBAR 3- LUMBAR 4 LATERAL INTERBODY FUSION WITH INSTRUMENTATION AND ALLOGRAFT (Left)     Patient location during evaluation: PACU Anesthesia Type: General Level of consciousness: awake and alert and oriented Pain management: pain level controlled Vital Signs Assessment: post-procedure vital signs reviewed and stable Respiratory status: spontaneous breathing, nonlabored ventilation and respiratory function stable Cardiovascular status: blood pressure returned to baseline and stable Postop Assessment: no apparent nausea or vomiting Anesthetic complications: no   No notable events documented.  Last Vitals:  Vitals:   04/05/23 1315 04/05/23 1330  BP: 104/66 112/65  Pulse: (!) 48 (!) 49  Resp: 12 16  Temp:    SpO2: 100% 93%    Last Pain:  Vitals:   04/05/23 1145  TempSrc:   PainSc: Asleep                 Kareemah Grounds A.

## 2023-04-05 NOTE — Anesthesia Procedure Notes (Signed)
Procedure Name: Intubation Date/Time: 04/05/2023 8:54 AM  Performed by: Kayleen Memos, CRNAPre-anesthesia Checklist: Patient identified, Emergency Drugs available, Suction available and Patient being monitored Patient Re-evaluated:Patient Re-evaluated prior to induction Oxygen Delivery Method: Circle System Utilized Preoxygenation: Pre-oxygenation with 100% oxygen Induction Type: IV induction Ventilation: Mask ventilation without difficulty Laryngoscope Size: Mac and 4 Grade View: Grade I Tube type: Oral Tube size: 7.0 mm Number of attempts: 1 Airway Equipment and Method: Stylet and Oral airway Placement Confirmation: ETT inserted through vocal cords under direct vision, positive ETCO2 and breath sounds checked- equal and bilateral Secured at: 22 cm Tube secured with: Tape Dental Injury: Teeth and Oropharynx as per pre-operative assessment

## 2023-04-05 NOTE — Op Note (Addendum)
PATIENT NAME: Abigail [name]   MEDICAL RECORD NO.:   [id number]   DATE OF BIRTH: March 10, 1946   DATE OF PROCEDURE: 04/05/2023                               OPERATIVE REPORT     PREOPERATIVE DIAGNOSES: 1.  Bilateral lumbar radiculopathy, manifesting as bilateral leg pain and weakness 2.  Neurogenic claudication 3.  Moderate to severe spinal stenosis, L2-3, L3-4 4.  Status post previous lumbar decompression and fusion spanning L4-S1 5.  Degenerative lumbar scoliosis    POSTOPERATIVE DIAGNOSES: 1.  Bilateral lumbar radiculopathy, manifesting as bilateral leg pain and weakness 2.  Neurogenic claudication 3.  Moderate to severe spinal stenosis, L2-3, L3-4 4.  Status post previous lumbar decompression and fusion spanning L4-S1 5.  Degenerative lumbar scoliosis    PROCEDURE: 1. Direct lateral interbody fusion, via a left-sided approach, L2/3, L3/4 2. Insertion of interbody device x 2 (Globus expandable intervertebral spacers     x 3). 3. Intraoperative use of fluoroscopy. 4. Use of morselized allograft - Vivigen   SURGEON:  Estill Bamberg, MD.   ASSISTANTJason Coop, PA-C.   ANESTHESIA:  General endotracheal anesthesia.   COMPLICATIONS:  None.   DISPOSITION:  Stable.   ESTIMATED BLOOD LOSS:  Minimal.   INDICATIONS FOR SURGERY:  Briefly, Abigail [name] is a pleasant 77 year old female who did present to me in the office with ongoing progressive bilateral leg pain and progressive bilateral leg weakness. I did feel that her symptoms were secondary to the rather obvious stenosis noted on her lumbar MRI and radiographs.  She did fail multiple conservative treatment measures.  Given this, we did discuss proceeding with a staged procedure.  Specifically, a left-sided lateral interbody fusion at the levels noted above, followed by a posterior decompression and fusion procedure on the following day.  After full understanding of the risks and recovery period associated with surgery, the patient  did elect to proceed.   OPERATIVE DETAILS:  On 04/05/2023, the patient was brought to surgery and general endotracheal anesthesia was administered.  The patient was placed in the lateral decubitus position with the left side up.  The patient's hips and knees were flexed and she was secured to the bed. His left arm was placed on an arm board.  All bony prominences were meticulously padded.  The bed was appropriately angled in order to ensure a perfect AP and lateral trajectory during surgery.  The patient's left flank was then prepped and draped in the usual sterile fashion.  A time-out procedure was performed.   At this point, a left-sided transverse incision was made between the L2-3 and L3-4 intervertebral spaces. The external and internal oblique musculature was dissected, and the transversalis fascia was entered, and the retroperitoneal space was noted.  The peritoneum was bluntly swept anteriorly.  I then was able to identify the psoas muscle.  I did use the initial dilator, which was advanced through the psoas muscle, and over the L3-4 intervertebral disc space, liberally using neurologic monitoring.  There were no neurologic structures in the immediate vicinity of the dilator.  I then sequentially dilated and placed a self-retaining retractor, which was secured to the bed using a rigid arm.  The retractor was slightly opened.  Again, using triggered EMG, it was clear that there were no neurologic structures in the immediate vicinity of the intervertebral disk space.  At this point, I  performed an annulotomy at the level of L3-4, and I did release the contralateral annulus.  I then performed a thorough and complete L3-4 intervertebral diskectomy.   I then advanced an expandable trial across the L3-4 intervertebral disc space, after which point an expandable titanium intervertebral spacer was packed with allograft and expanded to approximately 12.8 mm in height.  I was very pleased with  the restoration of intervertebral disc height.  The self-retaining retractor was then removed.   Utilizing the same incision, I again bluntly swept the peritoneum anteriorly, and inserted a dilator through the psoas muscle, this time, over the L2-3 intervertebral disc space, liberally using neurologic monitoring.  There were no neurologic structures in the immediate vicinity of the dilator.  I then sequentially dilated and placed a self-retaining retractor, which was secured to the bed using a rigid arm.  The retractor was slightly opened.  Again, using triggered EMG, it was clear that there were no neurologic structures in the immediate vicinity of the intervertebral disk space.  At this point, I performed an annulotomy at the level of the L2-3 intervertebral disc space, and I did release the contralateral annulus.  I then performed a thorough and complete L2-3 intervertebral diskectomy.   I then advanced an expandable trial across the L2-3 intervertebral disc space, after which point point, an expandable titanium intervertebral spacer was packed with allograft and expanded to approximately 9.2 mm in height.  I was very pleased with the restoration of intervertebral disc height.  The self-retaining retractor was then removed.    I was very pleased with the final AP and lateral fluoroscopic images.  The wound was then thoroughly irrigated, and closed in layers, using #1 Vicryl, followed by 2-0 Vicryl, followed by 3-0 Monocryl. Sterile dressings were applied and the patient was awoken from general endotracheal anesthesia and transferred to recovery in stable condition.   Of note, Jason Coop, was my assistant throughout surgery, and did aid in retraction, suctioning, and closure from start to finish.     Estill Bamberg, MD

## 2023-04-06 ENCOUNTER — Inpatient Hospital Stay (HOSPITAL_COMMUNITY): Payer: Medicare PPO | Admitting: Registered Nurse

## 2023-04-06 ENCOUNTER — Inpatient Hospital Stay (HOSPITAL_COMMUNITY): Payer: Medicare PPO

## 2023-04-06 ENCOUNTER — Inpatient Hospital Stay (HOSPITAL_COMMUNITY): Admission: RE | Admit: 2023-04-06 | Payer: Medicare PPO | Source: Home / Self Care | Admitting: Orthopedic Surgery

## 2023-04-06 ENCOUNTER — Inpatient Hospital Stay (HOSPITAL_COMMUNITY)
Admission: RE | Disposition: A | Discharge status: Home / Self Care | Payer: Self-pay | Source: Home / Self Care | Attending: Orthopedic Surgery

## 2023-04-06 ENCOUNTER — Encounter (HOSPITAL_COMMUNITY): Payer: Self-pay | Admitting: Orthopedic Surgery

## 2023-04-06 DIAGNOSIS — M48062 Spinal stenosis, lumbar region with neurogenic claudication: Secondary | ICD-10-CM

## 2023-04-06 LAB — POCT I-STAT EG7
Acid-base deficit: 3 mmol/L — ABNORMAL HIGH (ref 0.0–2.0)
Bicarbonate: 22.9 mmol/L (ref 20.0–28.0)
Calcium, Ion: 1.25 mmol/L (ref 1.15–1.40)
HCT: 32 % — ABNORMAL LOW (ref 36.0–46.0)
Hemoglobin: 10.9 g/dL — ABNORMAL LOW (ref 12.0–15.0)
O2 Saturation: 96 %
Patient temperature: 36
Potassium: 3.6 mmol/L (ref 3.5–5.1)
Sodium: 140 mmol/L (ref 135–145)
TCO2: 24 mmol/L (ref 22–32)
pCO2, Ven: 39.8 mmHg — ABNORMAL LOW (ref 44–60)
pH, Ven: 7.363 (ref 7.25–7.43)
pO2, Ven: 77 mmHg — ABNORMAL HIGH (ref 32–45)

## 2023-04-06 SURGERY — POSTERIOR LUMBAR FUSION 2 LEVEL
Anesthesia: General | Site: Spine Lumbar

## 2023-04-06 MED ORDER — LIDOCAINE 2% (20 MG/ML) 5 ML SYRINGE
INTRAMUSCULAR | Status: DC | PRN
  Administered 2023-04-06: 60 mg via INTRAVENOUS

## 2023-04-06 MED ORDER — CHLORHEXIDINE GLUCONATE 0.12 % MT SOLN
OROMUCOSAL | Status: AC
  Administered 2023-04-06: 15 mL via OROMUCOSAL
  Filled 2023-04-06: qty 15

## 2023-04-06 MED ORDER — ONDANSETRON HCL 4 MG/2ML IJ SOLN
INTRAMUSCULAR | Status: DC | PRN
  Administered 2023-04-06: 4 mg via INTRAVENOUS

## 2023-04-06 MED ORDER — ALBUTEROL SULFATE (2.5 MG/3ML) 0.083% IN NEBU
INHALATION_SOLUTION | RESPIRATORY_TRACT | Status: AC
  Filled 2023-04-06: qty 3

## 2023-04-06 MED ORDER — PHENYLEPHRINE HCL-NACL 20-0.9 MG/250ML-% IV SOLN
INTRAVENOUS | Status: DC | PRN
  Administered 2023-04-06: 40 ug/min via INTRAVENOUS

## 2023-04-06 MED ORDER — LIDOCAINE 2% (20 MG/ML) 5 ML SYRINGE
INTRAMUSCULAR | Status: AC
  Filled 2023-04-06: qty 5

## 2023-04-06 MED ORDER — THROMBIN 20000 UNITS EX SOLR
CUTANEOUS | Status: AC
  Filled 2023-04-06: qty 20000

## 2023-04-06 MED ORDER — ALBUTEROL SULFATE (2.5 MG/3ML) 0.083% IN NEBU
2.5000 mg | INHALATION_SOLUTION | Freq: Once | RESPIRATORY_TRACT | Status: AC
  Administered 2023-04-06: 2.5 mg via RESPIRATORY_TRACT

## 2023-04-06 MED ORDER — LACTATED RINGERS IV SOLN: INTRAVENOUS | Status: DC

## 2023-04-06 MED ORDER — METHOCARBAMOL 500 MG PO TABS: 500.0000 mg | ORAL_TABLET | Freq: Four times a day (QID) | ORAL | 2 refills | Status: DC | PRN

## 2023-04-06 MED ORDER — OXYCODONE-ACETAMINOPHEN 5-325 MG PO TABS: 1.0000 | ORAL_TABLET | ORAL | 0 refills | Status: DC | PRN

## 2023-04-06 MED ORDER — ACETAMINOPHEN 160 MG/5ML PO SOLN: 1000.0000 mg | Freq: Once | ORAL | Status: DC | PRN

## 2023-04-06 MED ORDER — ONDANSETRON HCL 4 MG/2ML IJ SOLN
INTRAMUSCULAR | Status: AC
  Filled 2023-04-06: qty 2

## 2023-04-06 MED ORDER — CHLORHEXIDINE GLUCONATE 0.12 % MT SOLN
15.0000 mL | OROMUCOSAL | Status: AC
  Filled 2023-04-06: qty 15

## 2023-04-06 MED ORDER — EPHEDRINE 5 MG/ML INJ
INTRAVENOUS | Status: AC
  Filled 2023-04-06: qty 5

## 2023-04-06 MED ORDER — ROCURONIUM BROMIDE 10 MG/ML (PF) SYRINGE
PREFILLED_SYRINGE | INTRAVENOUS | Status: AC
  Filled 2023-04-06: qty 10

## 2023-04-06 MED ORDER — 0.9 % SODIUM CHLORIDE (POUR BTL) OPTIME
TOPICAL | Status: DC | PRN
  Administered 2023-04-06 (×2): 1000 mL

## 2023-04-06 MED ORDER — CEFAZOLIN SODIUM-DEXTROSE 2-4 GM/100ML-% IV SOLN
INTRAVENOUS | Status: AC
  Filled 2023-04-06: qty 100

## 2023-04-06 MED ORDER — BUPIVACAINE LIPOSOME 1.3 % IJ SUSP
INTRAMUSCULAR | Status: AC
  Filled 2023-04-06: qty 20

## 2023-04-06 MED ORDER — FENTANYL CITRATE (PF) 100 MCG/2ML IJ SOLN
25.0000 ug | INTRAMUSCULAR | Status: DC | PRN
  Administered 2023-04-06 (×2): 50 ug via INTRAVENOUS

## 2023-04-06 MED ORDER — PROPOFOL 10 MG/ML IV BOLUS
INTRAVENOUS | Status: DC | PRN
  Administered 2023-04-06: 110 mg via INTRAVENOUS
  Administered 2023-04-06: 25 ug/kg/min via INTRAVENOUS

## 2023-04-06 MED ORDER — GLYCOPYRROLATE PF 0.2 MG/ML IJ SOSY
PREFILLED_SYRINGE | INTRAMUSCULAR | Status: AC
  Filled 2023-04-06: qty 1

## 2023-04-06 MED ORDER — GLYCOPYRROLATE PF 0.2 MG/ML IJ SOSY
PREFILLED_SYRINGE | INTRAMUSCULAR | Status: DC | PRN
  Administered 2023-04-06: .2 mg via INTRAVENOUS

## 2023-04-06 MED ORDER — SUGAMMADEX SODIUM 200 MG/2ML IV SOLN
INTRAVENOUS | Status: DC | PRN
  Administered 2023-04-06: 200 mg via INTRAVENOUS

## 2023-04-06 MED ORDER — DEXAMETHASONE SODIUM PHOSPHATE 10 MG/ML IJ SOLN
INTRAMUSCULAR | Status: AC
  Filled 2023-04-06: qty 1

## 2023-04-06 MED ORDER — HYDROCODONE-ACETAMINOPHEN 5-325 MG PO TABS
1.0000 | ORAL_TABLET | ORAL | Status: DC | PRN
  Administered 2023-04-06 – 2023-04-07 (×2): 1 via ORAL
  Filled 2023-04-06 (×2): qty 1

## 2023-04-06 MED ORDER — BUPIVACAINE-EPINEPHRINE 0.25% -1:200000 IJ SOLN
INTRAMUSCULAR | Status: DC | PRN
  Administered 2023-04-06: 10 mL
  Administered 2023-04-06: 20 mL

## 2023-04-06 MED ORDER — DEXMEDETOMIDINE HCL IN NACL 80 MCG/20ML IV SOLN
INTRAVENOUS | Status: DC | PRN
  Administered 2023-04-06: 8 ug via INTRAVENOUS

## 2023-04-06 MED ORDER — FENTANYL CITRATE (PF) 250 MCG/5ML IJ SOLN
INTRAMUSCULAR | Status: AC
  Filled 2023-04-06: qty 5

## 2023-04-06 MED ORDER — DEXAMETHASONE SODIUM PHOSPHATE 10 MG/ML IJ SOLN
INTRAMUSCULAR | Status: DC | PRN
  Administered 2023-04-06: 5 mg via INTRAVENOUS

## 2023-04-06 MED ORDER — EPHEDRINE SULFATE-NACL 50-0.9 MG/10ML-% IV SOSY
PREFILLED_SYRINGE | INTRAVENOUS | Status: DC | PRN
  Administered 2023-04-06: 5 mg via INTRAVENOUS

## 2023-04-06 MED ORDER — ACETAMINOPHEN 10 MG/ML IV SOLN
INTRAVENOUS | Status: AC
  Filled 2023-04-06: qty 100

## 2023-04-06 MED ORDER — PROPOFOL 10 MG/ML IV BOLUS
INTRAVENOUS | Status: AC
  Filled 2023-04-06: qty 20

## 2023-04-06 MED ORDER — THROMBIN 20000 UNITS EX KIT: PACK | CUTANEOUS | Status: DC | PRN

## 2023-04-06 MED ORDER — ACETAMINOPHEN 10 MG/ML IV SOLN
1000.0000 mg | Freq: Once | INTRAVENOUS | Status: DC | PRN
  Administered 2023-04-06: 1000 mg via INTRAVENOUS

## 2023-04-06 MED ORDER — BUPIVACAINE LIPOSOME 1.3 % IJ SUSP
INTRAMUSCULAR | Status: DC | PRN
  Administered 2023-04-06: 20 mL

## 2023-04-06 MED ORDER — BUPIVACAINE-EPINEPHRINE (PF) 0.25% -1:200000 IJ SOLN
INTRAMUSCULAR | Status: AC
  Filled 2023-04-06: qty 30

## 2023-04-06 MED ORDER — ACETAMINOPHEN 500 MG PO TABS: 1000.0000 mg | ORAL_TABLET | Freq: Once | ORAL | Status: DC | PRN

## 2023-04-06 MED ORDER — FENTANYL CITRATE (PF) 100 MCG/2ML IJ SOLN
INTRAMUSCULAR | Status: AC
  Filled 2023-04-06: qty 2

## 2023-04-06 MED ORDER — ROCURONIUM BROMIDE 10 MG/ML (PF) SYRINGE
PREFILLED_SYRINGE | INTRAVENOUS | Status: DC | PRN
  Administered 2023-04-06: 70 mg via INTRAVENOUS
  Administered 2023-04-06: 20 mg via INTRAVENOUS
  Administered 2023-04-06 (×2): 10 mg via INTRAVENOUS

## 2023-04-06 MED ORDER — FENTANYL CITRATE (PF) 250 MCG/5ML IJ SOLN
INTRAMUSCULAR | Status: DC | PRN
  Administered 2023-04-06: 50 ug via INTRAVENOUS
  Administered 2023-04-06: 100 ug via INTRAVENOUS
  Administered 2023-04-06 (×2): 50 ug via INTRAVENOUS

## 2023-04-06 SURGICAL SUPPLY — 82 items
AGENT HMST KT MTR STRL THRMB (HEMOSTASIS)
APL SKNCLS STERI-STRIP NONHPOA (GAUZE/BANDAGES/DRESSINGS) ×1
BAG COUNTER SPONGE SURGICOUNT (BAG) ×2 IMPLANT
BAG SPNG CNTER NS LX DISP (BAG) ×1
BENZOIN TINCTURE PRP APPL 2/3 (GAUZE/BANDAGES/DRESSINGS) IMPLANT
BLADE CLIPPER SURG (BLADE) IMPLANT
BONE FIBERS PLIAFX 5ML (Bone Implant) ×1 IMPLANT
BUR PRESCISION 1.7 ELITE (BURR) IMPLANT
BUR ROUND FLUTED 5 RND (BURR) IMPLANT
BUR ROUND PRECISION 4.0 (BURR) IMPLANT
BUR SABER RD CUTTING 3.0 (BURR) IMPLANT
CNTNR URN SCR LID CUP LEK RST (MISCELLANEOUS) ×2 IMPLANT
CONT SPEC 4OZ STRL OR WHT (MISCELLANEOUS) ×1
COVER SURGICAL LIGHT HANDLE (MISCELLANEOUS) ×2 IMPLANT
DRAIN CHANNEL 15F RND FF W/TCR (WOUND CARE) ×2 IMPLANT
DRAPE C-ARM 42X72 X-RAY (DRAPES) ×2 IMPLANT
DRAPE POUCH INSTRU U-SHP 10X18 (DRAPES) ×2 IMPLANT
DRAPE SURG 17X23 STRL (DRAPES) ×8 IMPLANT
DRSG MEPILEX POST OP 4X12 (GAUZE/BANDAGES/DRESSINGS) IMPLANT
DRSG MEPILEX POST OP 4X8 (GAUZE/BANDAGES/DRESSINGS) IMPLANT
DURAPREP 26ML APPLICATOR (WOUND CARE) ×2 IMPLANT
ELECT BLADE 4.0 EZ CLEAN MEGAD (MISCELLANEOUS) ×1
ELECT CAUTERY BLADE 6.4 (BLADE) ×4 IMPLANT
ELECT REM PT RETURN 9FT ADLT (ELECTROSURGICAL) ×1
ELECTRODE BLDE 4.0 EZ CLN MEGD (MISCELLANEOUS) ×2 IMPLANT
ELECTRODE REM PT RTRN 9FT ADLT (ELECTROSURGICAL) ×2 IMPLANT
EVACUATOR SILICONE 100CC (DRAIN) ×2 IMPLANT
GAUZE 4X4 16PLY ~~LOC~~+RFID DBL (SPONGE) ×4 IMPLANT
GAUZE SPONGE 4X4 12PLY STRL (GAUZE/BANDAGES/DRESSINGS) ×2 IMPLANT
GLOVE BIO SURGEON STRL SZ 6.5 (GLOVE) ×2 IMPLANT
GLOVE BIO SURGEON STRL SZ8 (GLOVE) ×2 IMPLANT
GLOVE BIOGEL PI IND STRL 7.0 (GLOVE) ×2 IMPLANT
GLOVE BIOGEL PI IND STRL 8 (GLOVE) ×2 IMPLANT
GLOVE SURG ENC MOIS LTX SZ6.5 (GLOVE) ×2 IMPLANT
GOWN STRL REUS W/ TWL LRG LVL3 (GOWN DISPOSABLE) ×4 IMPLANT
GOWN STRL REUS W/ TWL XL LVL3 (GOWN DISPOSABLE) ×2 IMPLANT
GOWN STRL REUS W/TWL LRG LVL3 (GOWN DISPOSABLE) ×2
GOWN STRL REUS W/TWL XL LVL3 (GOWN DISPOSABLE) ×1
GRAFT BNE FBR PLIAFX PRIME 5 (Bone Implant) IMPLANT
IV CATH 14GX2 1/4 (CATHETERS) ×2 IMPLANT
KIT BASIN OR (CUSTOM PROCEDURE TRAY) ×2 IMPLANT
KIT POSITION SURG JACKSON T1 (MISCELLANEOUS) ×2 IMPLANT
KIT TURNOVER KIT B (KITS) ×2 IMPLANT
MARKER SKIN DUAL TIP RULER LAB (MISCELLANEOUS) ×2 IMPLANT
NDL HYPO 25GX1X1/2 BEV (NEEDLE) ×2 IMPLANT
NDL SPNL 18GX3.5 QUINCKE PK (NEEDLE) ×4 IMPLANT
NEEDLE HYPO 25GX1X1/2 BEV (NEEDLE) ×1 IMPLANT
NEEDLE SPNL 18GX3.5 QUINCKE PK (NEEDLE) ×2 IMPLANT
NS IRRIG 1000ML POUR BTL (IV SOLUTION) ×2 IMPLANT
PACK LAMINECTOMY ORTHO (CUSTOM PROCEDURE TRAY) ×2 IMPLANT
PACK UNIVERSAL I (CUSTOM PROCEDURE TRAY) ×2 IMPLANT
PAD ARMBOARD 7.5X6 YLW CONV (MISCELLANEOUS) ×4 IMPLANT
PATTIES SURGICAL .5 X1 (DISPOSABLE) ×2 IMPLANT
PATTIES SURGICAL .5 X3 (DISPOSABLE) IMPLANT
PATTIES SURGICAL .5X1.5 (GAUZE/BANDAGES/DRESSINGS) ×2 IMPLANT
PATTIES SURGICAL .75X.75 (GAUZE/BANDAGES/DRESSINGS) ×2 IMPLANT
ROD EXPEDIUM PER BENT 65MM (Rod) IMPLANT
SCREW SET SINGLE INNER (Screw) IMPLANT
SCREW VIPER 6MMX25MM (Screw) IMPLANT
SCREW VIPER CORT FIX 6.00X30 (Screw) IMPLANT
SPONGE INTESTINAL PEANUT (DISPOSABLE) IMPLANT
SPONGE SURGIFOAM ABS GEL 100 (HEMOSTASIS) IMPLANT
STRIP CLOSURE SKIN 1/2X4 (GAUZE/BANDAGES/DRESSINGS) IMPLANT
SURGIFLO W/THROMBIN 8M KIT (HEMOSTASIS) IMPLANT
SUT MNCRL AB 4-0 PS2 18 (SUTURE) ×2 IMPLANT
SUT VIC AB 0 CT1 18XCR BRD 8 (SUTURE) ×4 IMPLANT
SUT VIC AB 0 CT1 8-18 (SUTURE) ×1
SUT VIC AB 1 CT1 18XCR BRD 8 (SUTURE) ×4 IMPLANT
SUT VIC AB 1 CT1 8-18 (SUTURE) ×1
SUT VIC AB 2-0 CT2 18 VCP726D (SUTURE) ×4 IMPLANT
SYR 20ML LL LF (SYRINGE) ×2 IMPLANT
SYR BULB IRRIG 60ML STRL (SYRINGE) ×2 IMPLANT
SYR CONTROL 10ML LL (SYRINGE) ×4 IMPLANT
SYR TB 1ML LUER SLIP (SYRINGE) ×2 IMPLANT
TAP EXPEDIUM DL 4.35 (INSTRUMENTS) IMPLANT
TAP EXPEDIUM DL 5.0 (INSTRUMENTS) IMPLANT
TAP EXPEDIUM DL 6.0 (INSTRUMENTS) IMPLANT
TOWEL GREEN STERILE (TOWEL DISPOSABLE) ×2 IMPLANT
TOWEL GREEN STERILE FF (TOWEL DISPOSABLE) ×2 IMPLANT
TRAY FOLEY MTR SLVR 16FR STAT (SET/KITS/TRAYS/PACK) ×2 IMPLANT
WATER STERILE IRR 1000ML POUR (IV SOLUTION) ×2 IMPLANT
YANKAUER SUCT BULB TIP NO VENT (SUCTIONS) ×2 IMPLANT

## 2023-04-06 NOTE — H&P (Signed)
Of note, patient tolerated yesterday's procedure well.  She presents today for stage 2, a posterior decompression and fusion.  We will proceed as planned.

## 2023-04-06 NOTE — Transfer of Care (Signed)
Immediate Anesthesia Transfer of Care Note  Patient: Abigail [name]  Procedure(s) Performed: POSTERIOR DECOMPRESSION AND FUSION LUMBAR 2- LUMBAR 3, LUMBAR 3- LUMBAR 4 WITH INSTRUMENTATION AND ALLOGRAFT (Spine Lumbar)  Patient Location: PACU  Anesthesia Type:General  Level of Consciousness: awake, alert , and oriented  Airway & Oxygen Therapy: Patient connected to face mask oxygen  Post-op Assessment: Report given to RN, Post -op Vital signs reviewed and stable, and Patient moving all extremities X 4  Post vital signs: Reviewed and stable  Last Vitals:  Vitals Value Taken Time  BP 112/56    Temp    Pulse 73 04/06/23 1041  Resp 17 04/06/23 1041  SpO2 94 % 04/06/23 1041  Vitals shown include unfiled device data.  Last Pain:  Vitals:   04/06/23 0425  TempSrc:   PainSc: 7       Patients Stated Pain Goal: 3 (04/05/23 2157)  Complications: There were no known notable events for this encounter.

## 2023-04-06 NOTE — Evaluation (Signed)
Physical Therapy Evaluation Patient Details Name: Abigail [name] MRN: [id number] DOB: [birth year] Today's Date: 04/06/2023  History of Present Illness  77 y.o. female s/p DLIF L2-4 (07/17). PMH significant for HTN, PNA, HLD, HTN, Bronchitis, hypothyroidism, Hx of Unstable Angina, PLIF (2017), R THA (2019)  Clinical Impression  Prior to admission, patient reported required assistance with functional mobility and ADLs. Patient uses SPC at baseline for household and community ambulation. Needs assistance from spouse for car transfers and dressing pants. Pt reported she received HHPT x2 a week prior to admission. pt presents with pain and decreased strength, balance, and activity tolerance. Today's session, patient able to demonstrate bed mobility, transfers and ambulation with minimal assistance from PT. Educated pt and spouse on spinal precautions and provided handout. Recommend next session to include progression of gait and stair training. Based on today's performance, PT recommends discharge to home with continued physical therapy in order to improve functional mobility, balance and independence.     Assistance Recommended at Discharge Intermittent Supervision/Assistance  If plan is discharge home, recommend the following:  Can travel by private vehicle  A little help with walking and/or transfers;A little help with bathing/dressing/bathroom;Assist for transportation;Help with stairs or ramp for entrance        Equipment Recommendations Rolling walker (2 wheels)  Recommendations for Other Services       Functional Status Assessment Patient has had a recent decline in their functional status and demonstrates the ability to make significant improvements in function in a reasonable and predictable amount of time.     Precautions / Restrictions Precautions Precautions: Back;Fall Precaution Booklet Issued: Yes (comment) Precaution Comments: Educated and verbalized throughout functional  mobility Required Braces or Orthoses: Spinal Brace Spinal Brace: Thoracolumbosacral orthotic Restrictions Weight Bearing Restrictions: No      Mobility  Bed Mobility Overal bed mobility: Needs Assistance Bed Mobility: Rolling, Sidelying to Sit, Sit to Sidelying Rolling: Min guard Sidelying to sit: Min guard     Sit to sidelying: Min guard General bed mobility comments: Lowered HOB, raised bed height, and lowered bed rails to simulate home environment. Patient able to demonstrate ability to perform with min guard from PT for safety and LE management. Educated patient and spouse on log roll technique with good return demonstration .    Transfers Overall transfer level: Needs assistance Equipment used: Rolling walker (2 wheels) Transfers: Sit to/from Stand Sit to Stand: Min guard, From elevated surface           General transfer comment: Elevated bed height to simulate home bed; able to perform with min guard for safety only. Simulated step stool with trash can to assist with scooting into bed    Ambulation/Gait Ambulation/Gait assistance: Min guard Gait Distance (Feet): 200 Feet Assistive device: Rolling walker (2 wheels) Gait Pattern/deviations: Step-through pattern, Decreased stride length Gait velocity: Decreased Gait velocity interpretation: <1.8 ft/sec, indicate of risk for recurrent falls   General Gait Details: Presented with decreased velocity and step through pattern. Patient with upright posture and no instances of major LOB throughout gait training. One instance of minor LOB when stepping backwards near bathroom door with min guard to recover.  Stairs            Wheelchair Mobility     Tilt Bed    Modified Rankin (Stroke Patients Only)       Balance Overall balance assessment: Needs assistance Sitting-balance support: Feet supported, No upper extremity supported Sitting balance-Leahy Scale: Fair Sitting balance - Comments: Sat EOB  Standing  balance support: Bilateral upper extremity supported, During functional activity Standing balance-Leahy Scale: Fair Standing balance comment: No UE support with upright static stance. Reliant on RW through ambulation                             Pertinent Vitals/Pain Pain Assessment Pain Assessment: 0-10 Pain Score: 9  Pain Location: Incisional, Lower back Pain Descriptors / Indicators: Operative site guarding, Discomfort, Grimacing Pain Intervention(s): Monitored during session, Limited activity within patient's tolerance    Home Living Family/patient expects to be discharged to:: Private residence Living Arrangements: Spouse/significant other Available Help at Discharge: Family;Available 24 hours/day Type of Home: House   Entrance Stairs-Rails: Can reach both Entrance Stairs-Number of Steps: 3 Alternate Level Stairs-Number of Steps: Flight Home Layout: Two level;Able to live on main level with bedroom/bathroom Home Equipment: [place] Mor - single point;Adaptive equipment      Prior Function Prior Level of Function : Needs assist       Physical Assist : Mobility (physical);ADLs (physical) Mobility (physical): Gait;Transfers ADLs (physical): Dressing Mobility Comments: Patient use SPC at baseline and HHA with car transfers. Patient reported received HHPT 2x a week for her hip prior to surgery. ADLs Comments: Spouse assists with dressing pants     Hand Dominance   Dominant Hand: Right    Extremity/Trunk Assessment   Upper Extremity Assessment Upper Extremity Assessment: Defer to OT evaluation    Lower Extremity Assessment Lower Extremity Assessment: Generalized weakness    Cervical / Trunk Assessment Cervical / Trunk Assessment: Back Surgery  Communication   Communication: No difficulties  Cognition Arousal/Alertness: Awake/alert Behavior During Therapy: WFL for tasks assessed/performed Overall Cognitive Status: Within Functional Limits for tasks assessed                                  General Comments: A+Ox4; Pleasant througout session; followed one step commands consistently        General Comments General comments (skin integrity, edema, etc.): VSS on RA; monitored O2 throughout session and maintained above 92%    Exercises     Assessment/Plan    PT Assessment Patient needs continued PT services  PT Problem List Decreased strength;Decreased range of motion;Decreased activity tolerance;Decreased balance;Decreased mobility;Decreased coordination;Decreased cognition;Decreased knowledge of use of DME;Decreased safety awareness;Decreased knowledge of precautions       PT Treatment Interventions DME instruction;Gait training;Stair training;Therapeutic activities;Functional mobility training;Therapeutic exercise;Balance training;Neuromuscular re-education;Patient/family education    PT Goals (Current goals can be found in the Care Plan section)  Acute Rehab PT Goals Patient Stated Goal: Patient would like to return home with family PT Goal Formulation: With patient/family Time For Goal Achievement: 04/20/23 Potential to Achieve Goals: Good    Frequency Min 5X/week     Co-evaluation               AM-PAC PT "6 Clicks" Mobility  Outcome Measure Help needed turning from your back to your side while in a flat bed without using bedrails?: A Little Help needed moving from lying on your back to sitting on the side of a flat bed without using bedrails?: A Little Help needed moving to and from a bed to a chair (including a wheelchair)?: A Little Help needed standing up from a chair using your arms (e.g., wheelchair or bedside chair)?: A Little Help needed to walk in hospital room?: A Little Help needed climbing 3-5  steps with a railing? : A Lot 6 Click Score: 17    End of Session Equipment Utilized During Treatment: Gait belt;Back brace Activity Tolerance: Patient tolerated treatment well Patient left: in bed;with  call bell/phone within reach;with nursing/sitter in room;with family/visitor present;with SCD's reapplied Nurse Communication: Mobility status PT Visit Diagnosis: Other abnormalities of gait and mobility (R26.89);Muscle weakness (generalized) (M62.81);Difficulty in walking, not elsewhere classified (R26.2);Pain Pain - part of body:  (lower back)    Time: 4098-1191 PT Time Calculation (min) (ACUTE ONLY): 44 min   Charges:   PT Evaluation $PT Eval Low Complexity: 1 Low PT Treatments $Gait Training: 23-37 mins PT General Charges $$ ACUTE PT VISIT: 1 Visit         Christene Lye, SPT Acute Rehabilitation Services [phone] Secure chat preferred    Christene Lye 04/06/2023, 3:53 PM

## 2023-04-06 NOTE — Anesthesia Preprocedure Evaluation (Addendum)
Anesthesia Evaluation  Patient identified by MRN, date of birth, ID band Patient awake    Reviewed: Allergy & Precautions, NPO status , Patient's Chart, lab work & pertinent test results  History of Anesthesia Complications (+) PONV and history of anesthetic complications  Airway Mallampati: III  TM Distance: >3 FB Neck ROM: Full    Dental  (+) Teeth Intact, Dental Advisory Given,    Pulmonary shortness of breath, sleep apnea , former smoker   breath sounds clear to auscultation       Cardiovascular hypertension, Pt. on medications and Pt. on home beta blockers (-) angina + CAD  (-) Past MI and (-) CHF  Rhythm:Regular  Left ventricle: The cavity size was normal. There was mild focal    basal hypertrophy of the septum. Systolic function was vigorous.    The estimated ejection fraction was in the range of 65% to 70%.    Wall motion was normal; there were no regional wall motion    abnormalities. Doppler parameters are consistent with abnormal    left ventricular relaxation (grade 1 diastolic dysfunction).  - Aortic valve: Trileaflet; mildly thickened, mildly calcified    leaflets.     Prox RCA lesion, 10% stenosed.  Prox Cx lesion, 10% stenosed.  Prox LAD to Dist LAD lesion, 10% stenosed.  The left ventricular systolic function is normal.   1. Mild non-obstructive CAD 2. Normal LV systolic function 3. Non-cardiac chest pain   Recommendations: Medical management of mild CAD    Neuro/Psych  Headaches CVA with balance issues  Neuromuscular disease CVA    GI/Hepatic negative GI ROS, Neg liver ROS,,,  Endo/Other  Hypothyroidism    Renal/GU negative Renal ROS     Musculoskeletal  (+) Arthritis ,    Abdominal   Peds  Hematology negative hematology ROS (+) Lab Results      Component                Value               Date                      WBC                      5.3                 03/28/2023                 HGB                      13.7                03/28/2023                HCT                      42.1                03/28/2023                MCV                      91.5                03/28/2023                PLT  170                 03/28/2023              Anesthesia Other Findings   Reproductive/Obstetrics                             Anesthesia Physical Anesthesia Plan  ASA: 2  Anesthesia Plan: General   Post-op Pain Management: Ofirmev IV (intra-op)*   Induction: Intravenous  PONV Risk Score and Plan: 4 or greater and Ondansetron and Dexamethasone  Airway Management Planned: Oral ETT  Additional Equipment:   Intra-op Plan:   Post-operative Plan: Extubation in OR  Informed Consent: I have reviewed the patients History and Physical, chart, labs and discussed the procedure including the risks, benefits and alternatives for the proposed anesthesia with the patient or authorized representative who has indicated his/her understanding and acceptance.     Dental advisory given  Plan Discussed with: CRNA  Anesthesia Plan Comments:        Anesthesia Quick Evaluation

## 2023-04-06 NOTE — Anesthesia Procedure Notes (Signed)

## 2023-04-06 NOTE — Progress Notes (Signed)
PT Cancellation Note  Patient Details Name: Abigail [name] MRN: [id number] DOB: Oct 13, 1945   Cancelled Treatment:    Reason Eval/Treat Not Completed: Patient at procedure or test/unavailable. PT eval received, chart reviewed. Pt currently in OR. Will check back as schedule allows to initiate PT evaluation.    Marylynn Pearson 04/06/2023, 8:56 AM  Conni Slipper, PT, DPT Acute Rehabilitation Services Secure Chat Preferred Office: [phone]

## 2023-04-06 NOTE — Anesthesia Postprocedure Evaluation (Signed)
Anesthesia Post Note  Patient: Abigail [name]  Procedure(s) Performed: POSTERIOR DECOMPRESSION AND FUSION LUMBAR 2- LUMBAR 3, LUMBAR 3- LUMBAR 4 WITH INSTRUMENTATION AND ALLOGRAFT (Spine Lumbar)     Patient location during evaluation: PACU Anesthesia Type: General Level of consciousness: awake and alert Pain management: pain level controlled Vital Signs Assessment: post-procedure vital signs reviewed and stable Respiratory status: spontaneous breathing, nonlabored ventilation, respiratory function stable and patient connected to nasal cannula oxygen Cardiovascular status: blood pressure returned to baseline and stable Postop Assessment: no apparent nausea or vomiting Anesthetic complications: no   There were no known notable events for this encounter.  Last Vitals:  Vitals:   04/06/23 1145 04/06/23 1551  BP: (!) 105/52 121/68  Pulse: (!) 59 64  Resp: 18 16  Temp:  36.9 C  SpO2: 97% 96%    Last Pain:  Vitals:   04/06/23 1551  TempSrc: Oral  PainSc:                  [name]

## 2023-04-06 NOTE — Op Note (Signed)
PATIENT NAME: Abigail [name]   MEDICAL RECORD NO.:   [id number]    DATE OF BIRTH: April 13, 1946   DATE OF PROCEDURE: 04/06/2023                               OPERATIVE REPORT     PREOPERATIVE DIAGNOSES: 1.  Status post L2-3 and L3/4 anterior/lateral lumbar fusion on 04/05/2023, requiring a posterior decompression and fusion with instrumentation 2.  Bilateral lumbar radiculopathy, manifesting as bilateral leg pain and weakness 3.  Neurogenic claudication 4.  Moderate to severe spinal stenosis, L2-3, L3-4 5.  Status post distant previous lumbar decompression and fusion spanning L4-S1 6.  Degenerative lumbar scoliosis   POSTOPERATIVE DIAGNOSES:   1.  Status post L2-3 and L3/4 anterior/lateral lumbar fusion on 04/05/2023, requiring a posterior decompression and fusion with instrumentation 2.  Bilateral lumbar radiculopathy, manifesting as bilateral leg pain and weakness 3.  Neurogenic claudication 4.  Moderate to severe spinal stenosis, L2-3, L3-4 5.  Status post distant previous lumbar decompression and fusion spanning L4-S1 6.  Degenerative lumbar scoliosis     PROCEDURE (Stage 2): 1. Posterior spinal fusion, L2-3, L3/4 2. L2-3 and L3-4 decompression, via bilateral posterior partial facetectomies 3. Placement of posterior segmental instrumentation, L2, L3 bilaterally. 4. Removal of previously placed posterior instrumentation L5, S1 5. Removal and replacement of posterior instrumentation, L4 6. Exploration of spinal fusion, L4-5, L5-S1 7. Use of morselized allograft - PliaFx 8. Intraoperative use of floroscopy   SURGEON:  Estill Bamberg, MD   ASSISTANT:  Jason Coop, PA-C   ANESTHESIA:  General endotracheal anesthesia.   COMPLICATIONS:  None.   DISPOSITION:  Stable.   ESTIMATED BLOOD LOSS:  Minimal   INDICATIONS FOR SURGERY: Briefly, Abigail [name] is one day status post an anterior/lateral lumbar fusion as noted above.  Please refer to my operative report dated 04/05/2023, for a full  account of the patient's preoperative history and indications for surgery.  The patient did present today for stage 2 of what was to be a 2-staged procedure.   OPERATIVE DETAILS:  On 04/06/2023, the patient was brought to surgery and general endotracheal anesthesia was administered.  The patient was placed prone onto a Jackson spinal bed.  The back was then prepped and draped in the usual sterile fashion.  I then made a midline incision, in line with the patient's previous incision, extended up to the L2 level.  The fascia was incised at the midline, and the paraspinal musculature was bluntly swept laterally.  The previously placed instrumentation spanning L4-S1 was noted.  This instrumentation was removed uneventfully.  I did explore the fusion across L4-5 and L5-S1 bilaterally, and it was clear that there was no motion across the segments, and a robust fusion was confirmed.  At this point, I proceeded with the decompressive aspect of the procedure.  To accomplish this, I turned my attention first to the L3-4 intervertebral space.  A bilateral partial facetectomy was performed, as was removal of overgrowth of the ligamentum flavum.  This did decompress the L3-4 intervertebral space.  Similarly, at L2-3, I performed a bilateral partial facetectomy with ligamentum flavum removal, thereby decompressing the L2-3 intervertebral space as well.  I was pleased with the decompression at both levels.  At this point, the L2 and L3 pedicles bilaterally were cannulated, using a medial to lateral cortical trajectory technique.  AP and lateral fluoroscopy was liberally utilized to accomplish this.  6 x  30 mm screws were placed into the pedicle screws bilaterally at L2 and L3.  At L4, 6 x 30 mm screws were placed into the pedicle holes that remained after removing the previously placed L4 instrumentation.  At this point, 65 mm rods were secured into the tulip heads bilaterally spanning L2-L4.  Caps were placed and a final  locking procedure was performed.  At this point, allograft in the form of PliaFx was packed into the facet joints and posterolateral gutters across L2-3 and L3-4 bilaterally, to aid in the success of the fusion. I was very pleased with the final AP and lateral fluoroscopic images.  The wound was then copiously irrigated.  The fascia was closed using #1 Vicryl.  The subcutaneous layer was closed using 0 Vicryl followed by 2-0 Vicryl, and the skin was then closed using 4-0 Monocryl. Benzoin and Steri-Strips were applied followed by sterile dressing.  All instrument counts were correct at the termination of the procedure.    Of note, Jason Coop was my assistant throughout surgery, and did aid in retraction, suctioning, and closure.      Estill Bamberg, MD

## 2023-04-07 MED FILL — Thrombin For Soln 20000 Unit: CUTANEOUS | Qty: 1 | Status: AC

## 2023-04-07 NOTE — Progress Notes (Signed)
    Patient doing well PO day 1 & 2 S/P staged LAT/POST fusion. She has resolved leg pain and expected PO LBP. She has been using tramadol but did accept oxy last night but prefers to avoid. She has been eating and drinking well.      Physical Exam: BP 109/67 (BP Location: Left Arm)   Pulse 78   Temp 98 F (36.7 C) (Oral)   Resp 19   Ht 5' 5.5" (1.664 m)   Wt 93 kg   LMP  (LMP Unknown)   SpO2 90%   BMI 33.59 kg/m     Dressing in place, CDI, appears comfortable sitting on bed but stiff NVI   POD #1 & 2 S/P staged LAT/POST fusion   - PO course complicated by pts desire to avoid most px meds due to hx N/V. Agreeable to percocet at hour of sleep and tramadol during day, flexeril during day. Sent to pharmacy - up with PT/OT, encourage ambulation - likely d/c home today with f/u in 2 weeksf/u in 2 weeks

## 2023-04-07 NOTE — Progress Notes (Signed)
Physical Therapy Treatment Patient Details Name: Abigail [name] MRN: [id number] DOB: Feb 15, 1946 Today's Date: 04/07/2023   History of Present Illness 77 y.o. female s/p DLIF L2-4 (07/17). PMH significant for HTN, PNA, HLD, HTN, Bronchitis, hypothyroidism, Hx of Unstable Angina, PLIF (2017), R THA (2019)    PT Comments  Pt greeted resting in bed and agreeable to session with continued progress towards acute goals. Pt with increased pain this session, despite pre-medication, however pt pleasant and participatory throughout. Pt able to complete bed mobility, functional transfers and gait with RW for support with grossly min guard assist. Pt needing light min A to return Les to bed at end of session due to increased pain. Pt able to progress stair training this session with min guard for safety and cues for sequencing. Pt was educated on continued walker use to maximize functional independence, safety, and decrease risk for falls as well as appropriate activity recommendations, brace wear and safe car entry/exit. Pt with good adherence to all precautions throughout session. Anticipate safe discharge, with assist level outlined below, once medically cleared, will continue to follow acutely.      Assistance Recommended at Discharge Intermittent Supervision/Assistance  If plan is discharge home, recommend the following:  Can travel by private vehicle    A little help with walking and/or transfers;A little help with bathing/dressing/bathroom;Assist for transportation;Help with stairs or ramp for entrance      Equipment Recommendations  Rolling walker (2 wheels)    Recommendations for Other Services       Precautions / Restrictions Precautions Precautions: Back;Fall Precaution Booklet Issued: Yes (comment) Precaution Comments: Educated and verbalized throughout functional mobility Required Braces or Orthoses: Spinal Brace Spinal Brace: Thoracolumbosacral orthotic Restrictions Weight Bearing  Restrictions: No     Mobility  Bed Mobility Overal bed mobility: Needs Assistance Bed Mobility: Rolling, Sidelying to Sit, Sit to Sidelying Rolling: Min guard Sidelying to sit: Min guard     Sit to sidelying: Min assist General bed mobility comments: min A to return LE to bed due to pain. able to come to sitting EOB with good log roll technique with min guard and increased time    Transfers Overall transfer level: Needs assistance Equipment used: Rolling walker (2 wheels) Transfers: Sit to/from Stand Sit to Stand: Min guard           General transfer comment: min guard for safety from bed at lowest height and elevated BSC over toilet    Ambulation/Gait Ambulation/Gait assistance: Min guard Gait Distance (Feet): 200 Feet Assistive device: Rolling walker (2 wheels) Gait Pattern/deviations: Step-through pattern, Decreased stride length Gait velocity: Decreased     General Gait Details: slow step through gait with RW for support, no LOB, noted R knee flexion in stance phase   Stairs Stairs: Yes Stairs assistance: Min guard Stair Management: Two rails, Step to pattern, Forwards Number of Stairs: 3 General stair comments: up/down steps in stairwell with cues for sequencing on ascent and descent   Wheelchair Mobility     Tilt Bed    Modified Rankin (Stroke Patients Only)       Balance Overall balance assessment: Needs assistance Sitting-balance support: Feet supported, No upper extremity supported Sitting balance-Leahy Scale: Fair Sitting balance - Comments: Sat EOB   Standing balance support: Bilateral upper extremity supported, During functional activity Standing balance-Leahy Scale: Fair Standing balance comment: No UE support with upright static stance. Reliant on RW through ambulation  Cognition Arousal/Alertness: Awake/alert Behavior During Therapy: WFL for tasks assessed/performed Overall Cognitive Status:  Within Functional Limits for tasks assessed                                 General Comments: A+Ox4; Pleasant througout session; followed one step commands consistently        Exercises      General Comments General comments (skin integrity, edema, etc.): VSS on RA      Pertinent Vitals/Pain Pain Assessment Pain Assessment: Faces Faces Pain Scale: Hurts whole lot Pain Location: Incisional, Lower back Pain Descriptors / Indicators: Operative site guarding, Discomfort, Grimacing Pain Intervention(s): Premedicated before session, Monitored during session, Limited activity within patient's tolerance    Home Living Family/patient expects to be discharged to:: Private residence Living Arrangements: Spouse/significant other   Type of Home: House Home Access: Stairs to enter Entrance Stairs-Rails: Can reach both Entrance Stairs-Number of Steps: 3 Alternate Level Stairs-Number of Steps: 3 Home Layout: Two level;Able to live on main level with bedroom/bathroom Home Equipment: Gil[employer] Mor - single point;Adaptive equipment      Prior Function            PT Goals (current goals can now be found in the care plan section) Acute Rehab PT Goals Patient Stated Goal: Patient would like to return home with family PT Goal Formulation: With patient/family Time For Goal Achievement: 04/20/23 Progress towards PT goals: Progressing toward goals    Frequency    Min 5X/week      PT Plan Current plan remains appropriate    Co-evaluation              AM-PAC PT "6 Clicks" Mobility   Outcome Measure  Help needed turning from your back to your side while in a flat bed without using bedrails?: A Little Help needed moving from lying on your back to sitting on the side of a flat bed without using bedrails?: A Little Help needed moving to and from a bed to a chair (including a wheelchair)?: A Little Help needed standing up from a chair using your arms (e.g., wheelchair or  bedside chair)?: A Little Help needed to walk in hospital room?: A Little Help needed climbing 3-5 steps with a railing? : A Little 6 Click Score: 18    End of Session Equipment Utilized During Treatment: Gait belt;Back brace Activity Tolerance: Patient tolerated treatment well Patient left: in bed;with call bell/phone within reach;with family/visitor present Nurse Communication: Mobility status PT Visit Diagnosis: Other abnormalities of gait and mobility (R26.89);Muscle weakness (generalized) (M62.81);Difficulty in walking, not elsewhere classified (R26.2);Pain Pain - part of body:  (lower back)     Time: 0623-7628 PT Time Calculation (min) (ACUTE ONLY): 26 min  Charges:    $Gait Training: 23-37 mins PT General Charges $$ ACUTE PT VISIT: 1 Visit                     Keala Drum R. PTA Acute Rehabilitation Services Office: [phone]   Catalina Antigua 04/07/2023, 10:37 AM

## 2023-04-07 NOTE — TOC Transition Note (Signed)
Transition of Care [facility]) - CM/SW Discharge Note   Patient Details  Name: Abigail [name] MRN: [id number] Date of Birth: 1946-02-13  [facility] of [facility] CM/SW Contact:  Gordy Clement, RN Phone Number: 04/07/2023, 10:06 AM   Clinical Narrative:    Patient will DC to home with Husband   Rolling walker will be given to her from unit. Home Health PT and OT will be provided by [facility]. Husband will transport home.  No additional TOC needs           Patient Goals and CMS Choice      Discharge Placement                         Discharge Plan and Services Additional resources added to the After Visit Summary for                                       Social Determinants of Health (SDOH) Interventions SDOH Screenings   Depression (PHQ2-9): Low Risk  (08/01/2022)  Tobacco Use: Medium Risk (04/06/2023)     Readmission Risk Interventions     No data to display

## 2023-04-07 NOTE — Progress Notes (Signed)
Patient alert and oriented, voiding adequately, skin clean, dry and intact without evidence of skin break down, or symptoms of complications - no redness or edema noted, only slight tenderness at site.  Patient states pain is manageable at time of discharge. Patient has an appointment with MD in 2 weeks 

## 2023-04-07 NOTE — Evaluation (Signed)
Occupational Therapy Evaluation and Discharge Patient Details Name: Abigail [name] MRN: [id number] DOB: 07/27/1946 Today's Date: 04/07/2023   History of Present Illness 77 y.o. female s/p DLIF L2-4 (07/17). PMH significant for HTN, PNA, HLD, HTN, Bronchitis, hypothyroidism, Hx of Unstable Angina, PLIF (2017), R THA (2019)   Clinical Impression   This 77 yo female admitted and underwent above presents to acute OT with all education completed, we will D/C from acute OT. Post op handout provided. We will D/C from acute OT.      Recommendations for follow up therapy are one component of a multi-disciplinary discharge planning process, led by the attending physician.  Recommendations may be updated based on patient status, additional functional criteria and insurance authorization.   Assistance Recommended at Discharge PRN  Patient can return home with the following A little help with walking and/or transfers;A lot of help with bathing/dressing/bathroom;Assistance with cooking/housework;Assist for transportation;Help with stairs or ramp for entrance    Functional Status Assessment  Patient has had a recent decline in their functional status and demonstrates the ability to make significant improvements in function in a reasonable and predictable amount of time. (without any further need for skilled OT)  Equipment Recommendations  BSC/3in1       Precautions / Restrictions Precautions Precautions: Back;Fall Precaution Booklet Issued: Yes (comment) Precaution Comments: Educated and verbalized throughout functional mobility Required Braces or Orthoses: Spinal Brace Spinal Brace: Thoracolumbosacral orthotic;Applied in sitting position;Applied in standing position Restrictions Weight Bearing Restrictions: No      Mobility Bed Mobility Overal bed mobility: Needs Assistance Bed Mobility: Rolling, Sidelying to Sit Rolling: Min assist Sidelying to sit: Min assist       General bed mobility  comments: Educated and demonstrated to patient's husband how he can A her with getting OOB while I was doing it with patient    Transfers Overall transfer level: Needs assistance Equipment used: Rolling walker (2 wheels) Transfers: Sit to/from Stand Sit to Stand: Min guard, From elevated surface           General transfer comment: Her bed is elevated at home; VCs for safe hand placement      Balance Overall balance assessment: Needs assistance Sitting-balance support: No upper extremity supported, Feet supported Sitting balance-Leahy Scale: Good     Standing balance support: Single extremity supported, Reliant on assistive device for balance Standing balance-Leahy Scale: Poor Standing balance comment: once hand on RW at a  time while pulling up LB clothing                           ADL either performed or assessed with clinical judgement   ADL                                         General ADL Comments: Educated pt on use of 2 cups for oral care to avoid bending over sink, use of wet wipes for back peri care, positionging with pillows when in bed, building up sitting tolerance. Husband plans on assisting with LBD and I educated him and pt on sequencing while pt actually did get dressed     Vision Patient Visual Report: No change from baseline              Pertinent Vitals/Pain Pain Assessment Faces Pain Scale: Hurts even more Pain Location: Incisional, Lower back Pain Descriptors /  Indicators: Operative site guarding, Discomfort, Grimacing Pain Intervention(s): Limited activity within patient's tolerance, Monitored during session, Repositioned     Hand Dominance Right   Extremity/Trunk Assessment Upper Extremity Assessment Upper Extremity Assessment: Overall WFL for tasks assessed           Communication Communication Communication: No difficulties   Cognition Arousal/Alertness: Awake/alert Behavior During Therapy: WFL for  tasks assessed/performed Overall Cognitive Status: Within Functional Limits for tasks assessed                                                  Home Living Family/patient expects to be discharged to:: Private residence Living Arrangements: Spouse/significant other Available Help at Discharge: Family;Available 24 hours/day Type of Home: House Home Access: Stairs to enter [employer] of Steps: 3 Entrance Stairs-Rails: Can reach both Home Layout: Two level;Able to live on main level with bedroom/bathroom Alternate Level Stairs-Number of Steps: 3 Alternate Level Stairs-Rails: Left Bathroom Shower/Tub: Walk-in shower;Door   [employer] Toilet: Standard     Home Equipment: Gilmer Mor - single point;Adaptive equipment Adaptive Equipment: Reacher        Prior Functioning/Environment Prior Level of Function : Needs assist       Physical Assist : Mobility (physical);ADLs (physical) Mobility (physical): Gait;Transfers ADLs (physical): Dressing Mobility Comments: Patient use SPC at baseline and HHA with car transfers. Patient reported received HHPT 2x a week for her hip prior to surgery. ADLs Comments: Spouse assists with dressing LB        OT Problem List: Decreased strength;Decreased range of motion;Impaired balance (sitting and/or standing);Pain         OT Goals(Current goals can be found in the care plan section) Acute Rehab OT Goals Patient Stated Goal: to go home today and pain to be better         AM-PAC OT "6 Clicks" Daily Activity     Outcome Measure Help from another person eating meals?: None Help from another person taking care of personal grooming?: A Little Help from another person toileting, which includes using toliet, bedpan, or urinal?: A Little Help from another person bathing (including washing, rinsing, drying)?: A Little Help from another person to put on and taking off regular upper body clothing?: A Little Help from another  person to put on and taking off regular lower body clothing?: A Lot 6 Click Score: 18   End of Session Equipment Utilized During Treatment: Rolling walker (2 wheels);Back brace  Activity Tolerance: Patient tolerated treatment well Patient left:  (sitting EOB waiting on getting in wheelchair to go home)  OT Visit Diagnosis: Unsteadiness on feet (R26.81);Other abnormalities of gait and mobility (R26.89);Pain Pain - part of body:  (incisional)                Time: 1610-9604 OT Time Calculation (min): 22 min Charges:  OT General Charges $OT Visit: 1 Visit OT Evaluation $OT Eval Moderate Complexity: 1 Mod  Cathy L. OT Acute Rehabilitation Services Office [phone]    Evette Georges 04/07/2023, 11:13 AM

## 2023-04-10 ENCOUNTER — Telehealth: Payer: Self-pay | Admitting: *Deleted

## 2023-04-10 ENCOUNTER — Encounter: Payer: Self-pay | Admitting: *Deleted

## 2023-04-10 NOTE — Transitions of Care (Post Inpatient/ED Visit) (Signed)
   04/10/2023  Name: Abigail [name] MRN: [id number] DOB: 1946-01-07  Today's TOC FU Call Status: Today's TOC FU Call Status:: Unsuccessul Call (1st Attempt) Unsuccessful Call (1st Attempt) Date: 04/10/23  Attempted to reach the patient regarding the most recent Inpatient visit; left HIPAA compliant voice message requesting call back  Follow Up Plan: Additional outreach attempts will be made to reach the patient to complete the Transitions of Care (Post Inpatient visit) call.   Caryl Pina, RN, BSN, CCRN Alumnus RN CM Care Coordination/ Transition of Care- [facility] Care Management [phone]: direct office

## 2023-04-11 ENCOUNTER — Telehealth: Payer: Self-pay | Admitting: *Deleted

## 2023-04-11 ENCOUNTER — Ambulatory Visit (HOSPITAL_COMMUNITY)
Admission: RE | Admit: 2023-04-11 | Discharge: 2023-04-11 | Disposition: A | Discharge status: Home / Self Care | Payer: Medicare PPO | Source: Ambulatory Visit | Attending: Cardiovascular Disease | Admitting: Cardiovascular Disease

## 2023-04-11 ENCOUNTER — Other Ambulatory Visit (HOSPITAL_COMMUNITY): Payer: Self-pay | Admitting: Orthopedic Surgery

## 2023-04-11 ENCOUNTER — Encounter: Payer: Self-pay | Admitting: *Deleted

## 2023-04-11 DIAGNOSIS — M7989 Other specified soft tissue disorders: Secondary | ICD-10-CM | POA: Insufficient documentation

## 2023-04-11 DIAGNOSIS — M79604 Pain in right leg: Secondary | ICD-10-CM | POA: Insufficient documentation

## 2023-04-11 NOTE — Transitions of Care (Post Inpatient/ED Visit) (Signed)
   04/11/2023  Name: Abigail [name] MRN: [id number] DOB: 1946-05-18  Today's TOC FU Call Status: Today's TOC FU Call Status:: Unsuccessful Call (2nd Attempt) Unsuccessful Call (2nd Attempt) Date: 04/11/23  Attempted to reach the patient regarding the most recent Inpatient visit; left HIPAA compliant voice message requesting call back  Follow Up Plan: Additional outreach attempts will be made to reach the patient to complete the Transitions of Care (Post Inpatient visit) call.   Caryl Pina, RN, BSN, CCRN Alumnus RN CM Care Coordination/ Transition of Care- [facility] Care Management [phone]: direct office

## 2023-04-12 ENCOUNTER — Telehealth: Payer: Self-pay | Admitting: *Deleted

## 2023-04-12 ENCOUNTER — Encounter: Payer: Self-pay | Admitting: *Deleted

## 2023-04-12 DIAGNOSIS — M47812 Spondylosis without myelopathy or radiculopathy, cervical region: Secondary | ICD-10-CM | POA: Diagnosis not present

## 2023-04-12 DIAGNOSIS — Z7902 Long term (current) use of antithrombotics/antiplatelets: Secondary | ICD-10-CM | POA: Diagnosis not present

## 2023-04-12 DIAGNOSIS — R1310 Dysphagia, unspecified: Secondary | ICD-10-CM | POA: Diagnosis not present

## 2023-04-12 DIAGNOSIS — F32A Depression, unspecified: Secondary | ICD-10-CM | POA: Diagnosis not present

## 2023-04-12 DIAGNOSIS — G8190 Hemiplegia, unspecified affecting unspecified side: Secondary | ICD-10-CM | POA: Diagnosis not present

## 2023-04-12 DIAGNOSIS — I10 Essential (primary) hypertension: Secondary | ICD-10-CM | POA: Diagnosis not present

## 2023-04-12 DIAGNOSIS — M5416 Radiculopathy, lumbar region: Secondary | ICD-10-CM | POA: Diagnosis not present

## 2023-04-12 DIAGNOSIS — M4316 Spondylolisthesis, lumbar region: Secondary | ICD-10-CM | POA: Diagnosis not present

## 2023-04-12 DIAGNOSIS — Z4789 Encounter for other orthopedic aftercare: Secondary | ICD-10-CM | POA: Diagnosis not present

## 2023-04-12 DIAGNOSIS — M48062 Spinal stenosis, lumbar region with neurogenic claudication: Secondary | ICD-10-CM | POA: Diagnosis not present

## 2023-04-12 NOTE — Transitions of Care (Post Inpatient/ED Visit) (Signed)
   04/12/2023  Name: Abigail [name] MRN: [id number] DOB: Jan 06, 1946  Today's TOC FU Call Status: Today's TOC FU Call Status:: Unsuccessful Call (3rd Attempt) Unsuccessful Call (3rd Attempt) Date: 04/12/23  Attempted to reach the patient regarding the most recent Inpatient visit; left HIPAA compliant voice message requesting call back  Follow Up Plan: No further outreach attempts will be made at this time. We have been unable to contact the patient.  Caryl Pina, RN, BSN, CCRN Alumnus RN CM Care Coordination/ Transition of Care- [facility] Care Management [phone]: direct office

## 2023-04-14 DIAGNOSIS — Z4789 Encounter for other orthopedic aftercare: Secondary | ICD-10-CM | POA: Diagnosis not present

## 2023-04-14 DIAGNOSIS — M47812 Spondylosis without myelopathy or radiculopathy, cervical region: Secondary | ICD-10-CM | POA: Diagnosis not present

## 2023-04-14 DIAGNOSIS — G8190 Hemiplegia, unspecified affecting unspecified side: Secondary | ICD-10-CM | POA: Diagnosis not present

## 2023-04-14 DIAGNOSIS — R1310 Dysphagia, unspecified: Secondary | ICD-10-CM | POA: Diagnosis not present

## 2023-04-14 DIAGNOSIS — F32A Depression, unspecified: Secondary | ICD-10-CM | POA: Diagnosis not present

## 2023-04-14 DIAGNOSIS — M5416 Radiculopathy, lumbar region: Secondary | ICD-10-CM | POA: Diagnosis not present

## 2023-04-14 DIAGNOSIS — M4316 Spondylolisthesis, lumbar region: Secondary | ICD-10-CM | POA: Diagnosis not present

## 2023-04-14 DIAGNOSIS — M48062 Spinal stenosis, lumbar region with neurogenic claudication: Secondary | ICD-10-CM | POA: Diagnosis not present

## 2023-04-14 DIAGNOSIS — I10 Essential (primary) hypertension: Secondary | ICD-10-CM | POA: Diagnosis not present

## 2023-04-18 DIAGNOSIS — M47812 Spondylosis without myelopathy or radiculopathy, cervical region: Secondary | ICD-10-CM | POA: Diagnosis not present

## 2023-04-18 DIAGNOSIS — M4316 Spondylolisthesis, lumbar region: Secondary | ICD-10-CM | POA: Diagnosis not present

## 2023-04-18 DIAGNOSIS — M48062 Spinal stenosis, lumbar region with neurogenic claudication: Secondary | ICD-10-CM | POA: Diagnosis not present

## 2023-04-18 DIAGNOSIS — Z4789 Encounter for other orthopedic aftercare: Secondary | ICD-10-CM | POA: Diagnosis not present

## 2023-04-18 DIAGNOSIS — M5416 Radiculopathy, lumbar region: Secondary | ICD-10-CM | POA: Diagnosis not present

## 2023-04-18 DIAGNOSIS — I10 Essential (primary) hypertension: Secondary | ICD-10-CM | POA: Diagnosis not present

## 2023-04-18 DIAGNOSIS — G8190 Hemiplegia, unspecified affecting unspecified side: Secondary | ICD-10-CM | POA: Diagnosis not present

## 2023-04-18 DIAGNOSIS — F32A Depression, unspecified: Secondary | ICD-10-CM | POA: Diagnosis not present

## 2023-04-18 DIAGNOSIS — R1310 Dysphagia, unspecified: Secondary | ICD-10-CM | POA: Diagnosis not present

## 2023-04-19 MED FILL — Sodium Chloride IV Soln 0.9%: INTRAVENOUS | Qty: 1000 | Status: AC

## 2023-04-19 MED FILL — Heparin Sodium (Porcine) Inj 1000 Unit/ML: INTRAMUSCULAR | Qty: 30 | Status: AC

## 2023-04-19 NOTE — Discharge Summary (Addendum)
Patient ID: Abigail [name] MRN: [id number] DOB/AGE: Mar 15, 1946 77 y.o.  Admit date: 04/05/2023 Discharge date: 04/07/2023  Admission Diagnoses:  Principal Problem:   Radiculopathy of lumbar region   Discharge Diagnoses:  Same  Past Medical History:  Diagnosis Date   Bronchitis 09/2016   Bruises easily    DDD (degenerative disc disease)    Degenerative joint disease of cervical spine 09-05-11   Cervival area, now some osteoarthritis-lower back and Rt. shoulder   Depression    Diverticulitis 09-05-11   hx. gastritis, diverticulitis x2 -now surgery planned   DIVERTICULITIS, ACUTE 02/10/2010   Dyspnea    with exertion   Essential hypertension 09-05-11   tx. Verapamil   GROIN PAIN 09/21/2010   Headache(784.0)    headaches are better   Hearing loss    right ear   Heart palpitations    Hemorrhoids    History of bronchitis    History of colonic polyps    History of pneumonia    History of tension headache    Hyperlipidemia    Hypertension    Hypothyroidism 09-05-11   Supplement used   Late effect of adverse effect of drug, medicinal or biological substance    NECK PAIN, ACUTE 09/21/2010   Neuromuscular disorder (HCC)    carpal tunnel in both hands   Nocturia    Osteoarthritis 112-17-12   spine and rt. hip, rt. shoulder   Patent foramen ovale    Small - unable to be closed -    Pneumonia    PONV (postoperative nausea and vomiting)    Sleep apnea 09-05-11   no cpap ever, had surgery to remove cartilage, no problems now   SORE THROAT 04/30/2009   Stroke (HCC) 09-05-11   2006/2009-(loss of memory, balance issues remains occ.)   TIA 09/28/2007, 10/2014   Unstable angina (HCC)    a. 05/2010 Cath: nl cors, EF 55%;  b. 05/2015 Lexiscan MV: small, severe, fixed apical defect and a small, severe, reversible inf lateral defect w/ apical thinning and mild ischemia, EF 54%.   URI 09/02/2008   Vertigo     Surgeries: Procedure(s): POSTERIOR DECOMPRESSION AND FUSION LUMBAR 2-  LUMBAR 3, LUMBAR 3- LUMBAR 4 WITH INSTRUMENTATION AND ALLOGRAFT on 04/06/2023   Consultants: None  Discharged Condition: Improved  Hospital Course: Abigail [name] is an 77 y.o. female who was admitted 04/05/2023 for operative treatment of Radiculopathy of lumbar region. Patient has severe unremitting pain that affects sleep, daily activities, and work/hobbies. After pre-op clearance the patient was taken to the operating room on 04/06/2023 and underwent  Procedure(s): POSTERIOR DECOMPRESSION AND FUSION LUMBAR 2- LUMBAR 3, LUMBAR 3- LUMBAR 4 WITH INSTRUMENTATION AND ALLOGRAFT.    Patient was given perioperative antibiotics:  Anti-infectives (From admission, onward)    Start     Dose/Rate Route Frequency Ordered Stop   04/06/23 0649  ceFAZolin (ANCEF) 2-4 GM/100ML-% IVPB       Note to Pharmacy: Payton Emerald A: cabinet override      04/06/23 0649 04/06/23 0829   04/06/23 0600  ceFAZolin (ANCEF) IVPB 2g/100 mL premix        2 g 200 mL/hr over 30 Minutes Intravenous On call to O.R. 04/05/23 1928 04/06/23 0820   04/05/23 1445  ceFAZolin (ANCEF) IVPB 2g/100 mL premix        2 g 200 mL/hr over 30 Minutes Intravenous Every 8 hours 04/05/23 1353 04/06/23 1249   04/05/23 0645  ceFAZolin (ANCEF) IVPB 2g/100 mL premix  2 g 200 mL/hr over 30 Minutes Intravenous On call to O.R. 04/05/23 0981 04/05/23 1914        Patient was given sequential compression devices, early ambulation to prevent DVT.  Patient benefited maximally from hospital stay and there were no complications.    Recent vital signs: BP 109/67 (BP Location: Left Arm)   Pulse 78   Temp 98 F (36.7 C) (Oral)   Resp 19   Ht 5' 5.5" (1.664 m)   Wt 93 kg   LMP  (LMP Unknown)   SpO2 96%   BMI 33.59 kg/m    Discharge Medications:   Allergies as of 04/07/2023       Reactions   Dilaudid [hydromorphone Hcl] Shortness Of Breath, Nausea And Vomiting, Other (See Comments)   Able to take morphine without issue   Shellfish Allergy  Shortness Of Breath, Rash   Only shrimp allergy   Aggrenox [aspirin-dipyridamole Er] Nausea And Vomiting, Other (See Comments)   Headache   Codeine Nausea And Vomiting   Doxycycline Monohydrate Diarrhea   Cephalexin Itching, Rash   Codeine Phosphate Nausea And Vomiting   Hydrocodone Nausea And Vomiting, Other (See Comments)   Can take morphine without issue   Meperidine Hcl Nausea And Vomiting   Percocet [oxycodone-acetaminophen] Nausea And Vomiting, Other (See Comments)   Can take morphine without issue        Medication List     STOP taking these medications    clopidogrel 75 MG tablet Commonly known as: PLAVIX       TAKE these medications    amLODipine 10 MG tablet Commonly known as: NORVASC Take 10 mg by mouth daily.   atorvastatin 40 MG tablet Commonly known as: LIPITOR TAKE 1 TABLET BY MOUTH EVERY DAY   butalbital-acetaminophen-caffeine 50-325-40 MG tablet Commonly known as: FIORICET TAKE 1 TABLET BY MOUTH EVERY 6 HOURS AS NEEDED FOR HEADACHE   levothyroxine 125 MCG tablet Commonly known as: SYNTHROID TAKE 1 TABLET BY MOUTH EVERY DAY BEFORE BREAKFAST   losartan 100 MG tablet Commonly known as: COZAAR TAKE 1 TABLET BY MOUTH EVERY DAY   methocarbamol 500 MG tablet Commonly known as: ROBAXIN Take 1 tablet (500 mg total) by mouth every 6 (six) hours as needed for muscle spasms.   metoprolol succinate 25 MG 24 hr tablet Commonly known as: TOPROL-XL TAKE 1 TABLET BY MOUTH EVERY DAY. (NEED APPT FOR REFILLS)   oxyCODONE-acetaminophen 5-325 MG tablet Commonly known as: PERCOCET/ROXICET Take 1-2 tablets by mouth every 4 (four) hours as needed for severe pain.   traMADol 50 MG tablet Commonly known as: ULTRAM TAKE 1 TO 2 TABLETS BY MOUTH EVERY 4 TO 6 HOURS AS NEEDED FOR PAIN        Diagnostic Studies: VAS [place] LOWER EXTREMITY VENOUS (DVT)  Result Date: 04/11/2023  Lower Venous DVT Study Patient Name:  THIRZA TYSZKA  Date of Exam:   04/11/2023 Medical Rec  #: [id number]    Accession #:    [id number] Date of Birth: [birth year]     Patient Gender: F Patient Age:   20 years Exam Location:  [facility] Procedure:      VAS [place] LOWER EXTREMITY VENOUS (DVT) Referring Phys: MARK DUMONSKI --------------------------------------------------------------------------------  Indications: Pain and swelling in the right lower extremity x 2 days. History of SOB but more pronounced since 2023/05/10, 26-Apr-2023.  Risk Factors: DVT history in the right lower extremity in 08/2018 post back surgery. Surgery to the lumbar on 04/05/2023 and 04/06/2023. Comparison Study: NA Performing  Technologist: Tyna Jaksch RVT  Examination Guidelines: A complete evaluation includes B-mode imaging, spectral Doppler, color Doppler, and power Doppler as needed of all accessible portions of each vessel. Bilateral testing is considered an integral part of a complete examination. Limited examinations for reoccurring indications may be performed as noted. The reflux portion of the exam is performed with the patient in reverse Trendelenburg.  +---------+---------------+---------+-----------+----------+--------------+ RIGHT    CompressibilityPhasicitySpontaneityPropertiesThrombus Aging +---------+---------------+---------+-----------+----------+--------------+ CFV      Full           Yes      Yes                                 +---------+---------------+---------+-----------+----------+--------------+ SFJ      Full           Yes      Yes                                 +---------+---------------+---------+-----------+----------+--------------+ FV Prox  Full           Yes      Yes                                 +---------+---------------+---------+-----------+----------+--------------+ FV Mid   Full                                                        +---------+---------------+---------+-----------+----------+--------------+ FV DistalFull           Yes      Yes                                  +---------+---------------+---------+-----------+----------+--------------+ PFV      Full           Yes      Yes                                 +---------+---------------+---------+-----------+----------+--------------+ POP      Full           Yes      Yes                                 +---------+---------------+---------+-----------+----------+--------------+ PTV      Full                                                        +---------+---------------+---------+-----------+----------+--------------+ PERO     Full                                                        +---------+---------------+---------+-----------+----------+--------------+ Gastroc  Full                                                        +---------+---------------+---------+-----------+----------+--------------+  GSV      Full           Yes      Yes                                 +---------+---------------+---------+-----------+----------+--------------+   Right Technical Findings: Limited visualization of the PTVs and peroneal veins due to increase lower leg swelling. Specifically, the popliteal vein and tibioperoneal confluence are without thrombus.  +----+---------------+---------+-----------+----------+--------------+ LEFTCompressibilityPhasicitySpontaneityPropertiesThrombus Aging +----+---------------+---------+-----------+----------+--------------+ CFV Full           Yes      Yes                                 +----+---------------+---------+-----------+----------+--------------+    Findings reported to [place] at 11:59 am.  Summary: RIGHT: - No evidence of deep vein thrombosis in the lower extremity. No indirect evidence of obstruction proximal to the inguinal ligament. - No cystic structure found in the popliteal fossa.  LEFT: - No evidence of common femoral vein obstruction.  *See table(s) above for measurements and observations. Electronically signed by Waverly Ferrari MD on 04/11/2023 at 2:54:12 PM.    Final    DG Lumbar Spine 1 View  Result Date: 04/06/2023 CLINICAL DATA:  Elective lumbar surgery. EXAM: LUMBAR SPINE - 1 VIEW COMPARISON:  April 05, 2023. FINDINGS: Single intraoperative cross-table lateral projection was obtained of the lumbar spine. Patient is status post previous surgical posterior fusion of L4-5 and L5-S1. One surgical probe is at approximately the L3-4 level and the other at approximately S2. IMPRESSION: Surgical localization as described above. Electronically Signed   By: Lupita Raider M.D.   On: 04/06/2023 15:14   DG Lumbar Spine 2-3 Views  Result Date: 04/06/2023 CLINICAL DATA:  [id number] Elective surgery [id number] EXAM: LUMBAR SPINE - 2-3 VIEW COMPARISON:  None Available. FINDINGS: The provided image demonstrates posterior spinal fixation of lumbar spine. Please refer to the separately issued operative report for further details. Fluoroscopy time: 51.1 seconds Radiation dose: 42.06 mGy IMPRESSION: *Fluoroscopic guidance for posterior spinal fixation of the lumbar spine. Electronically Signed   By: Jules Schick M.D.   On: 04/06/2023 11:41   DG C-Arm 1-60 Min-No Report  Result Date: 04/06/2023 Fluoroscopy was utilized by the requesting physician.  No radiographic interpretation.   DG C-Arm 1-60 Min-No Report  Result Date: 04/06/2023 Fluoroscopy was utilized by the requesting physician.  No radiographic interpretation.   DG Lumbar Spine 2-3 Views  Result Date: 04/05/2023 CLINICAL DATA:  Fluoroscopic assistance for lumbar spine surgery. EXAM: LUMBAR SPINE - 2-3 VIEW COMPARISON:  None Available. FINDINGS: Fluoroscopic images show intervertebral disc spaces at L2-L3 and L3-L4 levels. There is posterior fusion from L4-S1 levels. Fluoroscopic time 2 minutes and 33 seconds. Radiation dose 117.58 mGy. IMPRESSION: Fluoroscopic assistance was provided for lumbar spine surgery. Electronically Signed   By: Ernie Avena M.D.   On:  04/05/2023 15:13   DG C-Arm 1-60 Min-No Report  Result Date: 04/05/2023 Fluoroscopy was utilized by the requesting physician.  No radiographic interpretation.   DG C-Arm 1-60 Min-No Report  Result Date: 04/05/2023 Fluoroscopy was utilized by the requesting physician.  No radiographic interpretation.   DG C-Arm 1-60 Min-No Report  Result Date: 04/05/2023 Fluoroscopy was utilized by the requesting physician.  No radiographic interpretation.    Disposition: Discharge disposition: 01-Home or Self Care  Discharge Instructions     Discharge patient   Complete by: As directed    Discharge disposition: 01-Home or Self Care   Discharge patient date: 04/07/2023      POD #1 & 2 S/P staged LAT/POST fusion   - PO course complicated by pts desire to avoid most px meds due to hx N/V. Agreeable to percocet at hour of sleep and tramadol during day, flexeril during day. Sent to pharmacy - up with PT/OT, encourage ambulation -Scripts for pain sent to pharmacy electronically  -D/C instructions sheet printed and in chart -D/C today  -F/U in office 2 weeks   Signed: Eilene Ghazi Nakyla Bracco 04/19/2023, 11:26 AM

## 2023-04-20 DIAGNOSIS — G8190 Hemiplegia, unspecified affecting unspecified side: Secondary | ICD-10-CM | POA: Diagnosis not present

## 2023-04-20 DIAGNOSIS — M4316 Spondylolisthesis, lumbar region: Secondary | ICD-10-CM | POA: Diagnosis not present

## 2023-04-20 DIAGNOSIS — Z4789 Encounter for other orthopedic aftercare: Secondary | ICD-10-CM | POA: Diagnosis not present

## 2023-04-20 DIAGNOSIS — I10 Essential (primary) hypertension: Secondary | ICD-10-CM | POA: Diagnosis not present

## 2023-04-20 DIAGNOSIS — R1310 Dysphagia, unspecified: Secondary | ICD-10-CM | POA: Diagnosis not present

## 2023-04-20 DIAGNOSIS — M47812 Spondylosis without myelopathy or radiculopathy, cervical region: Secondary | ICD-10-CM | POA: Diagnosis not present

## 2023-04-20 DIAGNOSIS — M5416 Radiculopathy, lumbar region: Secondary | ICD-10-CM | POA: Diagnosis not present

## 2023-04-20 DIAGNOSIS — F32A Depression, unspecified: Secondary | ICD-10-CM | POA: Diagnosis not present

## 2023-04-20 DIAGNOSIS — M48062 Spinal stenosis, lumbar region with neurogenic claudication: Secondary | ICD-10-CM | POA: Diagnosis not present

## 2023-04-23 ENCOUNTER — Other Ambulatory Visit: Payer: Self-pay | Admitting: Family Medicine

## 2023-04-23 DIAGNOSIS — E7800 Pure hypercholesterolemia, unspecified: Secondary | ICD-10-CM

## 2023-04-24 DIAGNOSIS — R1310 Dysphagia, unspecified: Secondary | ICD-10-CM | POA: Diagnosis not present

## 2023-04-24 DIAGNOSIS — M47812 Spondylosis without myelopathy or radiculopathy, cervical region: Secondary | ICD-10-CM | POA: Diagnosis not present

## 2023-04-24 DIAGNOSIS — G8190 Hemiplegia, unspecified affecting unspecified side: Secondary | ICD-10-CM | POA: Diagnosis not present

## 2023-04-24 DIAGNOSIS — F32A Depression, unspecified: Secondary | ICD-10-CM | POA: Diagnosis not present

## 2023-04-24 DIAGNOSIS — M4316 Spondylolisthesis, lumbar region: Secondary | ICD-10-CM | POA: Diagnosis not present

## 2023-04-24 DIAGNOSIS — Z4789 Encounter for other orthopedic aftercare: Secondary | ICD-10-CM | POA: Diagnosis not present

## 2023-04-24 DIAGNOSIS — I10 Essential (primary) hypertension: Secondary | ICD-10-CM | POA: Diagnosis not present

## 2023-04-24 DIAGNOSIS — M48062 Spinal stenosis, lumbar region with neurogenic claudication: Secondary | ICD-10-CM | POA: Diagnosis not present

## 2023-04-24 DIAGNOSIS — M5416 Radiculopathy, lumbar region: Secondary | ICD-10-CM | POA: Diagnosis not present

## 2023-04-25 DIAGNOSIS — F32A Depression, unspecified: Secondary | ICD-10-CM | POA: Diagnosis not present

## 2023-04-25 DIAGNOSIS — M47812 Spondylosis without myelopathy or radiculopathy, cervical region: Secondary | ICD-10-CM | POA: Diagnosis not present

## 2023-04-25 DIAGNOSIS — R1310 Dysphagia, unspecified: Secondary | ICD-10-CM | POA: Diagnosis not present

## 2023-04-25 DIAGNOSIS — M48062 Spinal stenosis, lumbar region with neurogenic claudication: Secondary | ICD-10-CM | POA: Diagnosis not present

## 2023-04-25 DIAGNOSIS — G8190 Hemiplegia, unspecified affecting unspecified side: Secondary | ICD-10-CM | POA: Diagnosis not present

## 2023-04-25 DIAGNOSIS — Z4789 Encounter for other orthopedic aftercare: Secondary | ICD-10-CM | POA: Diagnosis not present

## 2023-04-25 DIAGNOSIS — I10 Essential (primary) hypertension: Secondary | ICD-10-CM | POA: Diagnosis not present

## 2023-04-25 DIAGNOSIS — M4316 Spondylolisthesis, lumbar region: Secondary | ICD-10-CM | POA: Diagnosis not present

## 2023-04-25 DIAGNOSIS — M5416 Radiculopathy, lumbar region: Secondary | ICD-10-CM | POA: Diagnosis not present

## 2023-04-26 ENCOUNTER — Encounter: Payer: Self-pay | Admitting: Family Medicine

## 2023-04-26 ENCOUNTER — Ambulatory Visit: Payer: Medicare PPO | Admitting: Family Medicine

## 2023-04-26 VITALS — BP 110/64 | HR 67 | Temp 98.7°F | Wt 194.0 lb

## 2023-04-26 DIAGNOSIS — Z981 Arthrodesis status: Secondary | ICD-10-CM

## 2023-04-26 DIAGNOSIS — R197 Diarrhea, unspecified: Secondary | ICD-10-CM | POA: Diagnosis not present

## 2023-04-26 DIAGNOSIS — R059 Cough, unspecified: Secondary | ICD-10-CM | POA: Diagnosis not present

## 2023-04-26 LAB — POC COVID19 BINAXNOW: SARS Coronavirus 2 Ag: NEGATIVE

## 2023-04-26 NOTE — Progress Notes (Signed)
   Subjective:    Patient ID: Abigail [name], female    DOB: 22-Jul-1946, 77 y.o.   MRN: [id number]  HPI Here with her daughter for a transitional care visit to follow up a hospital stay from 04-05-23 to 04-07-23. During that stay she had a lumbar fusion surgery to L2-L4 per Dr. Marissa Nestle. This seemed to go quite well. Her WBC was 5.3, the Hgb was 13.7, and the creatinine was 0.95. She is wearing a lumbar brace, and the pain has decreased a bit. She will be starting PT soon. A few days after going home she started to have nausea with vomiting, loss of appetite, and diarrhea. No fever. She is drinking fluids. Over the past few days the vomiting has stopped and the nausea has let up, but the diarrhea persists. She took 4 Imodium tablets yesterday, and she has not had a BM as yet today.    Review of Systems  Constitutional: Negative.   Respiratory: Negative.    Cardiovascular: Negative.   Gastrointestinal:  Positive for diarrhea and nausea. Negative for abdominal distention, abdominal pain, blood in stool, constipation and vomiting.  Genitourinary: Negative.        Objective:   Physical Exam Constitutional:      Appearance: Normal appearance. She is not ill-appearing.  Cardiovascular:     Rate and Rhythm: Normal rate and regular rhythm.     Pulses: Normal pulses.     Heart sounds: Normal heart sounds.  Pulmonary:     Effort: Pulmonary effort is normal.     Breath sounds: Normal breath sounds.  Abdominal:     General: Abdomen is flat. Bowel sounds are normal. There is no distension.     Palpations: Abdomen is soft. There is no mass.     Tenderness: There is no abdominal tenderness. There is no right CVA tenderness, left CVA tenderness, guarding or rebound.     Hernia: No hernia is present.  Neurological:     Mental Status: She is alert.           Assessment & Plan:  She seems to have a viral illness of some sort. She will drink plenty of Gatorade and water. She can take Imodium as  needed. We will check labs to check for C diff, a CBC, etc . We spent a total of ( 33  ) minutes reviewing records and discussing these issues.  Gershon Crane, MD

## 2023-04-27 ENCOUNTER — Telehealth: Payer: Self-pay

## 2023-04-27 NOTE — Telephone Encounter (Signed)
Pt message sent to  PCP for advise ?

## 2023-04-27 NOTE — Telephone Encounter (Signed)
Normal except her potassium is very low. Call in Klor-con 10 mEq to take 2 pills (20 mEq) BID for 5 days, #20. Recheck a BMET in one week

## 2023-04-28 ENCOUNTER — Other Ambulatory Visit: Payer: Self-pay

## 2023-04-28 DIAGNOSIS — E876 Hypokalemia: Secondary | ICD-10-CM

## 2023-04-28 MED ORDER — POTASSIUM CHLORIDE ER 10 MEQ PO TBCR: 10.0000 meq | EXTENDED_RELEASE_TABLET | Freq: Two times a day (BID) | ORAL | 0 refills | Status: DC

## 2023-04-28 NOTE — Telephone Encounter (Signed)
Reviewed lab results with pt, Rx for Potassium sent to her pharmacy and repeat lab for BMET placed on epic, Pt has lab appointment on 05/05/23

## 2023-04-28 NOTE — Addendum Note (Signed)
Addended by: Carola Rhine on: 04/28/2023 11:08 AM   Modules accepted: Orders

## 2023-04-29 DIAGNOSIS — M5416 Radiculopathy, lumbar region: Secondary | ICD-10-CM | POA: Diagnosis not present

## 2023-04-29 DIAGNOSIS — Z4789 Encounter for other orthopedic aftercare: Secondary | ICD-10-CM | POA: Diagnosis not present

## 2023-04-29 DIAGNOSIS — G8190 Hemiplegia, unspecified affecting unspecified side: Secondary | ICD-10-CM | POA: Diagnosis not present

## 2023-04-29 DIAGNOSIS — M48062 Spinal stenosis, lumbar region with neurogenic claudication: Secondary | ICD-10-CM | POA: Diagnosis not present

## 2023-04-29 DIAGNOSIS — F32A Depression, unspecified: Secondary | ICD-10-CM | POA: Diagnosis not present

## 2023-04-29 DIAGNOSIS — I10 Essential (primary) hypertension: Secondary | ICD-10-CM | POA: Diagnosis not present

## 2023-04-29 DIAGNOSIS — M47812 Spondylosis without myelopathy or radiculopathy, cervical region: Secondary | ICD-10-CM | POA: Diagnosis not present

## 2023-04-29 DIAGNOSIS — M4316 Spondylolisthesis, lumbar region: Secondary | ICD-10-CM | POA: Diagnosis not present

## 2023-04-29 DIAGNOSIS — R1310 Dysphagia, unspecified: Secondary | ICD-10-CM | POA: Diagnosis not present

## 2023-05-01 DIAGNOSIS — Z4789 Encounter for other orthopedic aftercare: Secondary | ICD-10-CM | POA: Diagnosis not present

## 2023-05-01 DIAGNOSIS — M47812 Spondylosis without myelopathy or radiculopathy, cervical region: Secondary | ICD-10-CM | POA: Diagnosis not present

## 2023-05-01 DIAGNOSIS — M4316 Spondylolisthesis, lumbar region: Secondary | ICD-10-CM | POA: Diagnosis not present

## 2023-05-01 DIAGNOSIS — R1310 Dysphagia, unspecified: Secondary | ICD-10-CM | POA: Diagnosis not present

## 2023-05-01 DIAGNOSIS — G8190 Hemiplegia, unspecified affecting unspecified side: Secondary | ICD-10-CM | POA: Diagnosis not present

## 2023-05-01 DIAGNOSIS — M48062 Spinal stenosis, lumbar region with neurogenic claudication: Secondary | ICD-10-CM | POA: Diagnosis not present

## 2023-05-01 DIAGNOSIS — I10 Essential (primary) hypertension: Secondary | ICD-10-CM | POA: Diagnosis not present

## 2023-05-01 DIAGNOSIS — M5416 Radiculopathy, lumbar region: Secondary | ICD-10-CM | POA: Diagnosis not present

## 2023-05-01 DIAGNOSIS — F32A Depression, unspecified: Secondary | ICD-10-CM | POA: Diagnosis not present

## 2023-05-02 ENCOUNTER — Other Ambulatory Visit: Payer: Self-pay

## 2023-05-02 DIAGNOSIS — R197 Diarrhea, unspecified: Secondary | ICD-10-CM | POA: Diagnosis not present

## 2023-05-03 ENCOUNTER — Telehealth: Payer: Self-pay | Admitting: Family Medicine

## 2023-05-03 DIAGNOSIS — M5416 Radiculopathy, lumbar region: Secondary | ICD-10-CM | POA: Diagnosis not present

## 2023-05-03 DIAGNOSIS — R1310 Dysphagia, unspecified: Secondary | ICD-10-CM | POA: Diagnosis not present

## 2023-05-03 DIAGNOSIS — M47812 Spondylosis without myelopathy or radiculopathy, cervical region: Secondary | ICD-10-CM | POA: Diagnosis not present

## 2023-05-03 DIAGNOSIS — F32A Depression, unspecified: Secondary | ICD-10-CM | POA: Diagnosis not present

## 2023-05-03 DIAGNOSIS — M4316 Spondylolisthesis, lumbar region: Secondary | ICD-10-CM | POA: Diagnosis not present

## 2023-05-03 DIAGNOSIS — Z4789 Encounter for other orthopedic aftercare: Secondary | ICD-10-CM | POA: Diagnosis not present

## 2023-05-03 DIAGNOSIS — G8190 Hemiplegia, unspecified affecting unspecified side: Secondary | ICD-10-CM | POA: Diagnosis not present

## 2023-05-03 DIAGNOSIS — M48062 Spinal stenosis, lumbar region with neurogenic claudication: Secondary | ICD-10-CM | POA: Diagnosis not present

## 2023-05-03 DIAGNOSIS — I10 Essential (primary) hypertension: Secondary | ICD-10-CM | POA: Diagnosis not present

## 2023-05-03 NOTE — Telephone Encounter (Signed)
Moira OT with [facility] is calling on behalf of pt. Pt has potassium chloride (KLOR-CON 10) 10 MEQ tablet #20 and has taken 1 tablet twice a day for 5 days and still has 10 tablet left and she would like to know what to do with the remaining pills. Please call pt

## 2023-05-04 DIAGNOSIS — R1310 Dysphagia, unspecified: Secondary | ICD-10-CM | POA: Diagnosis not present

## 2023-05-04 DIAGNOSIS — M5416 Radiculopathy, lumbar region: Secondary | ICD-10-CM | POA: Diagnosis not present

## 2023-05-04 DIAGNOSIS — G8190 Hemiplegia, unspecified affecting unspecified side: Secondary | ICD-10-CM | POA: Diagnosis not present

## 2023-05-04 DIAGNOSIS — F32A Depression, unspecified: Secondary | ICD-10-CM | POA: Diagnosis not present

## 2023-05-04 DIAGNOSIS — M47812 Spondylosis without myelopathy or radiculopathy, cervical region: Secondary | ICD-10-CM | POA: Diagnosis not present

## 2023-05-04 DIAGNOSIS — M4316 Spondylolisthesis, lumbar region: Secondary | ICD-10-CM | POA: Diagnosis not present

## 2023-05-04 DIAGNOSIS — I10 Essential (primary) hypertension: Secondary | ICD-10-CM | POA: Diagnosis not present

## 2023-05-04 DIAGNOSIS — M48062 Spinal stenosis, lumbar region with neurogenic claudication: Secondary | ICD-10-CM | POA: Diagnosis not present

## 2023-05-04 DIAGNOSIS — Z4789 Encounter for other orthopedic aftercare: Secondary | ICD-10-CM | POA: Diagnosis not present

## 2023-05-04 LAB — CLOSTRIDIUM DIFFICILE BY PCR: Toxigenic C. Difficile by PCR: NEGATIVE

## 2023-05-04 NOTE — Telephone Encounter (Signed)
Spoke with pt clarified directions on how to take potassium. Pt la appointment rescheduled per Dr Clent Ridges

## 2023-05-05 ENCOUNTER — Other Ambulatory Visit: Payer: Medicare PPO

## 2023-05-09 ENCOUNTER — Other Ambulatory Visit (INDEPENDENT_AMBULATORY_CARE_PROVIDER_SITE_OTHER): Payer: Medicare PPO

## 2023-05-09 DIAGNOSIS — E876 Hypokalemia: Secondary | ICD-10-CM

## 2023-05-09 LAB — BASIC METABOLIC PANEL
BUN: 10 mg/dL (ref 6–23)
CO2: 24 mEq/L (ref 19–32)
Calcium: 9.8 mg/dL (ref 8.4–10.5)
Chloride: 105 mEq/L (ref 96–112)
Creatinine, Ser: 1.03 mg/dL (ref 0.40–1.20)
GFR: 52.25 mL/min — ABNORMAL LOW (ref >60.00)
Glucose, Bld: 141 mg/dL — ABNORMAL HIGH (ref 70–99)
Potassium: 3.1 mEq/L — ABNORMAL LOW (ref 3.5–5.1)
Sodium: 140 mEq/L (ref 135–145)

## 2023-05-10 MED ORDER — POTASSIUM CHLORIDE CRYS ER 10 MEQ PO TBCR: EXTENDED_RELEASE_TABLET | ORAL | 3 refills | Status: DC

## 2023-05-21 ENCOUNTER — Other Ambulatory Visit: Payer: Self-pay | Admitting: Family Medicine

## 2023-05-21 DIAGNOSIS — E7800 Pure hypercholesterolemia, unspecified: Secondary | ICD-10-CM

## 2023-05-23 ENCOUNTER — Ambulatory Visit: Payer: Medicare PPO | Admitting: Physician Assistant

## 2023-06-03 ENCOUNTER — Other Ambulatory Visit: Payer: Self-pay | Admitting: Family Medicine

## 2023-06-03 DIAGNOSIS — E7800 Pure hypercholesterolemia, unspecified: Secondary | ICD-10-CM

## 2023-06-06 ENCOUNTER — Ambulatory Visit: Payer: Medicare PPO | Admitting: Family Medicine

## 2023-06-06 ENCOUNTER — Encounter: Payer: Self-pay | Admitting: Family Medicine

## 2023-06-06 VITALS — BP 120/76 | HR 58 | Temp 98.7°F | Wt 199.0 lb

## 2023-06-06 DIAGNOSIS — R059 Cough, unspecified: Secondary | ICD-10-CM | POA: Diagnosis not present

## 2023-06-06 DIAGNOSIS — J019 Acute sinusitis, unspecified: Secondary | ICD-10-CM | POA: Diagnosis not present

## 2023-06-06 LAB — POC COVID19 BINAXNOW: SARS Coronavirus 2 Ag: NEGATIVE

## 2023-06-06 MED ORDER — AMOXICILLIN-POT CLAVULANATE 875-125 MG PO TABS: 1.0000 | ORAL_TABLET | Freq: Two times a day (BID) | ORAL | 0 refills | Status: DC

## 2023-06-06 NOTE — Addendum Note (Signed)
Addended by: Carola Rhine on: 06/06/2023 04:45 PM   Modules accepted: Orders

## 2023-06-06 NOTE — Progress Notes (Signed)
Subjective:    Patient ID: Abigail [name], female    DOB: [birth year], 77 y.o.   MRN: [id number]  HPI Here for 2 weeks of sinus congestion, dizziness, PND, ST, and a dry cough. No fever or SOB. Drinking fluids.    Review of Systems  Constitutional: Negative.   HENT:  Positive for congestion, postnasal drip, sinus pressure and sore throat. Negative for ear pain.   Eyes: Negative.   Respiratory:  Positive for cough. Negative for shortness of breath and wheezing.        Objective:   Physical Exam Constitutional:      Appearance: Normal appearance. She is not ill-appearing.  HENT:     Right Ear: Tympanic membrane, ear canal and external ear normal.     Left Ear: Tympanic membrane, ear canal and external ear normal.     Nose: Nose normal.     Mouth/Throat:     Pharynx: Oropharynx is clear.  Eyes:     Conjunctiva/sclera: Conjunctivae normal.  Pulmonary:     Effort: Pulmonary effort is normal.     Breath sounds: Normal breath sounds.  Lymphadenopathy:     Cervical: No cervical adenopathy.  Neurological:     Mental Status: She is alert.           Assessment & Plan:  Sinusitis, treat with 10 days of Augmentin. Add Mucinex and Delsym as needed. Gershon Crane, MD

## 2023-06-07 ENCOUNTER — Telehealth: Payer: Self-pay | Admitting: Family Medicine

## 2023-06-07 NOTE — Telephone Encounter (Signed)
Pt call and stated she can't take the medication that he gave her yesterday because it make her nausea and want to know if he will call her in some else .

## 2023-06-08 NOTE — Telephone Encounter (Signed)
We don't have very many choices of antibiotics she can take. Call in Zofran 8 mg to take every 8 hours, #60 with no rf. This should allow her to take the one she has

## 2023-06-14 ENCOUNTER — Ambulatory Visit
Admission: RE | Admit: 2023-06-14 | Discharge: 2023-06-14 | Disposition: A | Discharge status: Home / Self Care | Payer: Medicare PPO | Source: Ambulatory Visit | Attending: Family Medicine | Admitting: Family Medicine

## 2023-06-14 DIAGNOSIS — Z1231 Encounter for screening mammogram for malignant neoplasm of breast: Secondary | ICD-10-CM | POA: Diagnosis not present

## 2023-06-15 MED ORDER — ONDANSETRON HCL 8 MG PO TABS: 8.0000 mg | ORAL_TABLET | Freq: Three times a day (TID) | ORAL | 0 refills | Status: DC | PRN

## 2023-06-15 NOTE — Telephone Encounter (Signed)
Called pt, is aware of medication at pharmacy.

## 2023-06-16 ENCOUNTER — Other Ambulatory Visit: Payer: Self-pay | Admitting: Family Medicine

## 2023-06-16 DIAGNOSIS — R928 Other abnormal and inconclusive findings on diagnostic imaging of breast: Secondary | ICD-10-CM

## 2023-06-17 ENCOUNTER — Other Ambulatory Visit: Payer: Self-pay | Admitting: Physician Assistant

## 2023-06-19 DIAGNOSIS — M545 Low back pain, unspecified: Secondary | ICD-10-CM | POA: Diagnosis not present

## 2023-06-19 DIAGNOSIS — Z9889 Other specified postprocedural states: Secondary | ICD-10-CM | POA: Diagnosis not present

## 2023-06-20 ENCOUNTER — Other Ambulatory Visit: Payer: Self-pay | Admitting: Physician Assistant

## 2023-06-22 DIAGNOSIS — M6281 Muscle weakness (generalized): Secondary | ICD-10-CM | POA: Diagnosis not present

## 2023-06-22 DIAGNOSIS — M4326 Fusion of spine, lumbar region: Secondary | ICD-10-CM | POA: Diagnosis not present

## 2023-06-23 ENCOUNTER — Other Ambulatory Visit: Payer: Self-pay | Admitting: Family Medicine

## 2023-06-23 ENCOUNTER — Ambulatory Visit
Admission: RE | Admit: 2023-06-23 | Discharge: 2023-06-23 | Disposition: A | Discharge status: Home / Self Care | Payer: Medicare PPO | Source: Ambulatory Visit | Attending: Family Medicine | Admitting: Family Medicine

## 2023-06-23 DIAGNOSIS — R921 Mammographic calcification found on diagnostic imaging of breast: Secondary | ICD-10-CM

## 2023-06-23 DIAGNOSIS — R928 Other abnormal and inconclusive findings on diagnostic imaging of breast: Secondary | ICD-10-CM

## 2023-06-23 DIAGNOSIS — Z803 Family history of malignant neoplasm of breast: Secondary | ICD-10-CM | POA: Diagnosis not present

## 2023-06-27 ENCOUNTER — Ambulatory Visit: Payer: Medicare PPO | Attending: Physician Assistant | Admitting: Physician Assistant

## 2023-06-27 ENCOUNTER — Encounter: Payer: Self-pay | Admitting: Physician Assistant

## 2023-06-27 VITALS — BP 126/74 | HR 58 | Ht 65.0 in | Wt 198.0 lb

## 2023-06-27 DIAGNOSIS — E876 Hypokalemia: Secondary | ICD-10-CM | POA: Diagnosis not present

## 2023-06-27 DIAGNOSIS — R0609 Other forms of dyspnea: Secondary | ICD-10-CM

## 2023-06-27 DIAGNOSIS — Z8673 Personal history of transient ischemic attack (TIA), and cerebral infarction without residual deficits: Secondary | ICD-10-CM

## 2023-06-27 DIAGNOSIS — R002 Palpitations: Secondary | ICD-10-CM | POA: Diagnosis not present

## 2023-06-27 NOTE — Progress Notes (Signed)
[id number] Study Date: 12/07/2017 Gender:     F Age:        77 Height:     166.4 cm Weight:     86.6 kg BSA:        2.03 m^2 Pt. Status: Room:  [facility] REFERRING    Abigail [name] ATTENDING    Donato Schultz, M.D. SONOGRAPHER  Clearence Ped, RCS PERFORMING   [facility],  Outpatient  cc:  ------------------------------------------------------------------- LV EF: 65% -   70%  ------------------------------------------------------------------- Indications:      Precordial Pain (R07.2).  ------------------------------------------------------------------- History:   PMH:  small PFO on TEE, hyperlipidemia, Palpitations. Transient ischemic attack.  Risk factors:  Hypertension.  ------------------------------------------------------------------- Study Conclusions  - Left ventricle: The cavity size was normal. There was mild focal basal hypertrophy of the septum. Systolic function was vigorous. The estimated ejection fraction was in the range of 65% to 70%. Wall motion was normal; there were no regional wall motion abnormalities. Doppler parameters are consistent with abnormal left ventricular relaxation (grade 1 diastolic dysfunction). - Aortic valve: Trileaflet; mildly thickened, mildly calcified leaflets.  ------------------------------------------------------------------- Study data:  No prior study was available for comparison.  Study status:  Routine.  Procedure:  The patient reported no pain pre or post test. Transthoracic echocardiography. Image quality was adequate.          Transthoracic echocardiography.  M-mode, complete 2D, spectral Doppler, and color Doppler.  Birthdate: Patient birthdate: 05/31/1946.  Age:  Patient is 77 yr old.  Sex: Gender: female.    BMI: 31.3 kg/m^2.  Blood pressure:     118/70 Patient status:  Outpatient.  Study date:  Study date: 12/07/2017. Study time: 01:12 PM.  Location:  [facility]  -------------------------------------------------------------------  ------------------------------------------------------------------- Left ventricle:  The cavity size was normal. There was mild focal basal hypertrophy of the septum. Systolic function was vigorous. The estimated ejection fraction was in the range of 65%  to 70%. Wall motion was normal; there were no regional wall motion abnormalities. Doppler parameters are consistent with abnormal left ventricular relaxation (grade 1 diastolic dysfunction).  ------------------------------------------------------------------- Aortic valve:   Trileaflet; mildly thickened, mildly calcified leaflets. Mobility was not restricted.  Doppler:  Transvalvular velocity was within the normal range. There was no stenosis. There was no regurgitation.  ------------------------------------------------------------------- Aorta:  Aortic root: The aortic root was normal in size.  ------------------------------------------------------------------- Mitral valve:   Structurally normal valve.   Mobility was not restricted.  Doppler:  Transvalvular velocity was within the normal range. There was no evidence for stenosis. There was no regurgitation.  ------------------------------------------------------------------- Left atrium:  The atrium was normal in size.  ------------------------------------------------------------------- Right ventricle:  The cavity size was normal. Wall thickness was normal. Systolic function was normal.  ------------------------------------------------------------------- Pulmonic valve:   Poorly visualized.  Structurally normal valve. Cusp separation was normal.  Doppler:  Transvalvular velocity was within the normal range. There was no evidence for stenosis. There was trivial regurgitation.  ------------------------------------------------------------------- Tricuspid valve:   Structurally normal valve.    Doppler: Transvalvular velocity was within the normal range. There was no regurgitation.  ------------------------------------------------------------------- Pulmonary artery:   The main pulmonary artery was normal-sized. Systolic pressure was within the normal  range.  ------------------------------------------------------------------- Right atrium:  The atrium was normal in size.  ------------------------------------------------------------------- Pericardium:  There was no pericardial effusion.  ------------------------------------------------------------------- Systemic veins: Inferior vena cava: The vessel was normal in size. The respirophasic diameter changes were in the normal range (= 50%), consistent with normal  Cardiology Office Note:  .   Date:  06/27/2023  ID:  Abigail [name], DOB September 01, 1946, MRN [id number] PCP: Nelwyn Salisbury, MD  [facility] Providers Cardiologist:  Abigail Millers, MD     History of Present Illness: .   Abigail [name] is a 77 y.o. female with PMH of CVA, palpitation and presyncope.  She had PFO on TEE in 2006 after CVA.  Cardiac catheterization in September 2016 showed no CAD and normal EF.  Echocardiogram in March 2019 demonstrated normal EF, mild diastolic dysfunction.  Carotid Doppler in July 2020 showed 1 to 39% bilateral disease.  Heart monitor in December 2022 showed normal sinus rhythm, PACs, brief PAT, rare PVC and a 9 beats of nonsustained VT.  Cardiac CTA in January 2024 showed coronary calcium score of 58 which was 77 percentile and a mild stenosis in the second diagonal and RCA.   Patient was last seen by Dr. Jens Som in March 2024 at which time she had dyspnea associated with palpitation at times.  Dr. Jens Som recommended she consider AliveCor or Apple Watch to help monitor palpitation.  Recent venous Doppler obtained in July 2024 was negative for DVT.  She underwent lumbar surgery in July.  Patient presents today for follow-up.  Her primary concern is dyspnea with exertion.  She denies any chest pain.  My suspicion that her dyspnea on exertion is cardiac is fairly low, I will obtain an echocardiogram to confirm.  If echocardiogram is normal, all the possibility contribute to the shortness of breath with exertion include pulmonary versus deconditioning.  Since her back surgery, she has been doing physical therapy which I think will help.  If echocardiogram is normal, she can follow-up in 6 to 9 months.    ROS:   Patient complains of dyspnea on exertion but no chest pain, lower extremity edema, thumping or PND.  Studies Reviewed: .        Cardiac Studies & Procedures   CARDIAC CATHETERIZATION  CARDIAC CATHETERIZATION 06/01/2015  Narrative  Prox RCA lesion, 10%  stenosed.  Prox Cx lesion, 10% stenosed.  Prox LAD to Dist LAD lesion, 10% stenosed.  The left ventricular systolic function is normal.  1. Mild non-obstructive CAD 2. Normal LV systolic function 3. Non-cardiac chest pain  Recommendations: Medical management of mild CAD.  Findings Coronary Findings Diagnostic  Dominance: Right  Left Anterior Descending diffuse .  First Diagonal Branch The vessel is moderate in size.  First Septal Branch The vessel is small in size.  Second Diagonal Branch The vessel is small in size.  Left Circumflex The vessel is large . discrete .  Second Obtuse Marginal Branch The vessel is moderate in size.  Right Coronary Artery diffuse .  Intervention  No interventions have been documented.   STRESS TESTS  MYOCARDIAL PERFUSION IMAGING 05/22/2015  Narrative  The left ventricular ejection fraction is mildly decreased (45-54%).  Nuclear stress EF: 54%.  There was no ST segment deviation noted during stress.  This is a low risk study.  Low risk stress nuclear study with a small, severe, fixed apical defect and a small, severe, reversible inferior lateral defect; findings consistent with apical thinning and mild ischemia in the inferior lateral wall; EF 54 and normal wall motion.   ECHOCARDIOGRAM  ECHOCARDIOGRAM COMPLETE 12/07/2017  Narrative *[facility] [address] [place], [place] [address] [phone]  ------------------------------------------------------------------- Transthoracic Echocardiography  Patient:    Philomina, Leon MR #:  [id number] Study Date: 12/07/2017 Gender:     F Age:        77 Height:     166.4 cm Weight:     86.6 kg BSA:        2.03 m^2 Pt. Status: Room:  [facility] REFERRING    Abigail [name] ATTENDING    Donato Schultz, M.D. SONOGRAPHER  Clearence Ped, RCS PERFORMING   [facility],  Outpatient  cc:  ------------------------------------------------------------------- LV EF: 65% -   70%  ------------------------------------------------------------------- Indications:      Precordial Pain (R07.2).  ------------------------------------------------------------------- History:   PMH:  small PFO on TEE, hyperlipidemia, Palpitations. Transient ischemic attack.  Risk factors:  Hypertension.  ------------------------------------------------------------------- Study Conclusions  - Left ventricle: The cavity size was normal. There was mild focal basal hypertrophy of the septum. Systolic function was vigorous. The estimated ejection fraction was in the range of 65% to 70%. Wall motion was normal; there were no regional wall motion abnormalities. Doppler parameters are consistent with abnormal left ventricular relaxation (grade 1 diastolic dysfunction). - Aortic valve: Trileaflet; mildly thickened, mildly calcified leaflets.  ------------------------------------------------------------------- Study data:  No prior study was available for comparison.  Study status:  Routine.  Procedure:  The patient reported no pain pre or post test. Transthoracic echocardiography. Image quality was adequate.          Transthoracic echocardiography.  M-mode, complete 2D, spectral Doppler, and color Doppler.  Birthdate: Patient birthdate: 05/31/1946.  Age:  Patient is 77 yr old.  Sex: Gender: female.    BMI: 31.3 kg/m^2.  Blood pressure:     118/70 Patient status:  Outpatient.  Study date:  Study date: 12/07/2017. Study time: 01:12 PM.  Location:  [facility]  -------------------------------------------------------------------  ------------------------------------------------------------------- Left ventricle:  The cavity size was normal. There was mild focal basal hypertrophy of the septum. Systolic function was vigorous. The estimated ejection fraction was in the range of 65%  to 70%. Wall motion was normal; there were no regional wall motion abnormalities. Doppler parameters are consistent with abnormal left ventricular relaxation (grade 1 diastolic dysfunction).  ------------------------------------------------------------------- Aortic valve:   Trileaflet; mildly thickened, mildly calcified leaflets. Mobility was not restricted.  Doppler:  Transvalvular velocity was within the normal range. There was no stenosis. There was no regurgitation.  ------------------------------------------------------------------- Aorta:  Aortic root: The aortic root was normal in size.  ------------------------------------------------------------------- Mitral valve:   Structurally normal valve.   Mobility was not restricted.  Doppler:  Transvalvular velocity was within the normal range. There was no evidence for stenosis. There was no regurgitation.  ------------------------------------------------------------------- Left atrium:  The atrium was normal in size.  ------------------------------------------------------------------- Right ventricle:  The cavity size was normal. Wall thickness was normal. Systolic function was normal.  ------------------------------------------------------------------- Pulmonic valve:   Poorly visualized.  Structurally normal valve. Cusp separation was normal.  Doppler:  Transvalvular velocity was within the normal range. There was no evidence for stenosis. There was trivial regurgitation.  ------------------------------------------------------------------- Tricuspid valve:   Structurally normal valve.    Doppler: Transvalvular velocity was within the normal range. There was no regurgitation.  ------------------------------------------------------------------- Pulmonary artery:   The main pulmonary artery was normal-sized. Systolic pressure was within the normal  range.  ------------------------------------------------------------------- Right atrium:  The atrium was normal in size.  ------------------------------------------------------------------- Pericardium:  There was no pericardial effusion.  ------------------------------------------------------------------- Systemic veins: Inferior vena cava: The vessel was normal in size. The respirophasic diameter changes were in the normal range (= 50%), consistent with normal  Cardiology Office Note:  .   Date:  06/27/2023  ID:  Abigail [name], DOB September 01, 1946, MRN [id number] PCP: Nelwyn Salisbury, MD  [facility] Providers Cardiologist:  Abigail Millers, MD     History of Present Illness: .   Abigail [name] is a 77 y.o. female with PMH of CVA, palpitation and presyncope.  She had PFO on TEE in 2006 after CVA.  Cardiac catheterization in September 2016 showed no CAD and normal EF.  Echocardiogram in March 2019 demonstrated normal EF, mild diastolic dysfunction.  Carotid Doppler in July 2020 showed 1 to 39% bilateral disease.  Heart monitor in December 2022 showed normal sinus rhythm, PACs, brief PAT, rare PVC and a 9 beats of nonsustained VT.  Cardiac CTA in January 2024 showed coronary calcium score of 58 which was 77 percentile and a mild stenosis in the second diagonal and RCA.   Patient was last seen by Dr. Jens Som in March 2024 at which time she had dyspnea associated with palpitation at times.  Dr. Jens Som recommended she consider AliveCor or Apple Watch to help monitor palpitation.  Recent venous Doppler obtained in July 2024 was negative for DVT.  She underwent lumbar surgery in July.  Patient presents today for follow-up.  Her primary concern is dyspnea with exertion.  She denies any chest pain.  My suspicion that her dyspnea on exertion is cardiac is fairly low, I will obtain an echocardiogram to confirm.  If echocardiogram is normal, all the possibility contribute to the shortness of breath with exertion include pulmonary versus deconditioning.  Since her back surgery, she has been doing physical therapy which I think will help.  If echocardiogram is normal, she can follow-up in 6 to 9 months.    ROS:   Patient complains of dyspnea on exertion but no chest pain, lower extremity edema, thumping or PND.  Studies Reviewed: .        Cardiac Studies & Procedures   CARDIAC CATHETERIZATION  CARDIAC CATHETERIZATION 06/01/2015  Narrative  Prox RCA lesion, 10%  stenosed.  Prox Cx lesion, 10% stenosed.  Prox LAD to Dist LAD lesion, 10% stenosed.  The left ventricular systolic function is normal.  1. Mild non-obstructive CAD 2. Normal LV systolic function 3. Non-cardiac chest pain  Recommendations: Medical management of mild CAD.  Findings Coronary Findings Diagnostic  Dominance: Right  Left Anterior Descending diffuse .  First Diagonal Branch The vessel is moderate in size.  First Septal Branch The vessel is small in size.  Second Diagonal Branch The vessel is small in size.  Left Circumflex The vessel is large . discrete .  Second Obtuse Marginal Branch The vessel is moderate in size.  Right Coronary Artery diffuse .  Intervention  No interventions have been documented.   STRESS TESTS  MYOCARDIAL PERFUSION IMAGING 05/22/2015  Narrative  The left ventricular ejection fraction is mildly decreased (45-54%).  Nuclear stress EF: 54%.  There was no ST segment deviation noted during stress.  This is a low risk study.  Low risk stress nuclear study with a small, severe, fixed apical defect and a small, severe, reversible inferior lateral defect; findings consistent with apical thinning and mild ischemia in the inferior lateral wall; EF 54 and normal wall motion.   ECHOCARDIOGRAM  ECHOCARDIOGRAM COMPLETE 12/07/2017  Narrative *[facility]* [address] [place], [place] [address] [phone]  ------------------------------------------------------------------- Transthoracic Echocardiography  Patient:    Philomina, Leon MR #:  Cardiology Office Note:  .   Date:  06/27/2023  ID:  Abigail [name], DOB September 01, 1946, MRN [id number] PCP: Nelwyn Salisbury, MD  [facility] Providers Cardiologist:  Abigail Millers, MD     History of Present Illness: .   Abigail [name] is a 77 y.o. female with PMH of CVA, palpitation and presyncope.  She had PFO on TEE in 2006 after CVA.  Cardiac catheterization in September 2016 showed no CAD and normal EF.  Echocardiogram in March 2019 demonstrated normal EF, mild diastolic dysfunction.  Carotid Doppler in July 2020 showed 1 to 39% bilateral disease.  Heart monitor in December 2022 showed normal sinus rhythm, PACs, brief PAT, rare PVC and a 9 beats of nonsustained VT.  Cardiac CTA in January 2024 showed coronary calcium score of 58 which was 77 percentile and a mild stenosis in the second diagonal and RCA.   Patient was last seen by Dr. Jens Som in March 2024 at which time she had dyspnea associated with palpitation at times.  Dr. Jens Som recommended she consider AliveCor or Apple Watch to help monitor palpitation.  Recent venous Doppler obtained in July 2024 was negative for DVT.  She underwent lumbar surgery in July.  Patient presents today for follow-up.  Her primary concern is dyspnea with exertion.  She denies any chest pain.  My suspicion that her dyspnea on exertion is cardiac is fairly low, I will obtain an echocardiogram to confirm.  If echocardiogram is normal, all the possibility contribute to the shortness of breath with exertion include pulmonary versus deconditioning.  Since her back surgery, she has been doing physical therapy which I think will help.  If echocardiogram is normal, she can follow-up in 6 to 9 months.    ROS:   Patient complains of dyspnea on exertion but no chest pain, lower extremity edema, thumping or PND.  Studies Reviewed: .        Cardiac Studies & Procedures   CARDIAC CATHETERIZATION  CARDIAC CATHETERIZATION 06/01/2015  Narrative  Prox RCA lesion, 10%  stenosed.  Prox Cx lesion, 10% stenosed.  Prox LAD to Dist LAD lesion, 10% stenosed.  The left ventricular systolic function is normal.  1. Mild non-obstructive CAD 2. Normal LV systolic function 3. Non-cardiac chest pain  Recommendations: Medical management of mild CAD.  Findings Coronary Findings Diagnostic  Dominance: Right  Left Anterior Descending diffuse .  First Diagonal Branch The vessel is moderate in size.  First Septal Branch The vessel is small in size.  Second Diagonal Branch The vessel is small in size.  Left Circumflex The vessel is large . discrete .  Second Obtuse Marginal Branch The vessel is moderate in size.  Right Coronary Artery diffuse .  Intervention  No interventions have been documented.   STRESS TESTS  MYOCARDIAL PERFUSION IMAGING 05/22/2015  Narrative  The left ventricular ejection fraction is mildly decreased (45-54%).  Nuclear stress EF: 54%.  There was no ST segment deviation noted during stress.  This is a low risk study.  Low risk stress nuclear study with a small, severe, fixed apical defect and a small, severe, reversible inferior lateral defect; findings consistent with apical thinning and mild ischemia in the inferior lateral wall; EF 54 and normal wall motion.   ECHOCARDIOGRAM  ECHOCARDIOGRAM COMPLETE 12/07/2017  Narrative *[facility] [address] [place], [place] [address] [phone]  ------------------------------------------------------------------- Transthoracic Echocardiography  Patient:    Philomina, Leon MR #:  [id number] Study Date: 12/07/2017 Gender:     F Age:        77 Height:     166.4 cm Weight:     86.6 kg BSA:        2.03 m^2 Pt. Status: Room:  [facility] REFERRING    Abigail [name] ATTENDING    Donato Schultz, M.D. SONOGRAPHER  Clearence Ped, RCS PERFORMING   [facility],  Outpatient  cc:  ------------------------------------------------------------------- LV EF: 65% -   70%  ------------------------------------------------------------------- Indications:      Precordial Pain (R07.2).  ------------------------------------------------------------------- History:   PMH:  small PFO on TEE, hyperlipidemia, Palpitations. Transient ischemic attack.  Risk factors:  Hypertension.  ------------------------------------------------------------------- Study Conclusions  - Left ventricle: The cavity size was normal. There was mild focal basal hypertrophy of the septum. Systolic function was vigorous. The estimated ejection fraction was in the range of 65% to 70%. Wall motion was normal; there were no regional wall motion abnormalities. Doppler parameters are consistent with abnormal left ventricular relaxation (grade 1 diastolic dysfunction). - Aortic valve: Trileaflet; mildly thickened, mildly calcified leaflets.  ------------------------------------------------------------------- Study data:  No prior study was available for comparison.  Study status:  Routine.  Procedure:  The patient reported no pain pre or post test. Transthoracic echocardiography. Image quality was adequate.          Transthoracic echocardiography.  M-mode, complete 2D, spectral Doppler, and color Doppler.  Birthdate: Patient birthdate: 05/31/1946.  Age:  Patient is 77 yr old.  Sex: Gender: female.    BMI: 31.3 kg/m^2.  Blood pressure:     118/70 Patient status:  Outpatient.  Study date:  Study date: 12/07/2017. Study time: 01:12 PM.  Location:  [facility]  -------------------------------------------------------------------  ------------------------------------------------------------------- Left ventricle:  The cavity size was normal. There was mild focal basal hypertrophy of the septum. Systolic function was vigorous. The estimated ejection fraction was in the range of 65%  to 70%. Wall motion was normal; there were no regional wall motion abnormalities. Doppler parameters are consistent with abnormal left ventricular relaxation (grade 1 diastolic dysfunction).  ------------------------------------------------------------------- Aortic valve:   Trileaflet; mildly thickened, mildly calcified leaflets. Mobility was not restricted.  Doppler:  Transvalvular velocity was within the normal range. There was no stenosis. There was no regurgitation.  ------------------------------------------------------------------- Aorta:  Aortic root: The aortic root was normal in size.  ------------------------------------------------------------------- Mitral valve:   Structurally normal valve.   Mobility was not restricted.  Doppler:  Transvalvular velocity was within the normal range. There was no evidence for stenosis. There was no regurgitation.  ------------------------------------------------------------------- Left atrium:  The atrium was normal in size.  ------------------------------------------------------------------- Right ventricle:  The cavity size was normal. Wall thickness was normal. Systolic function was normal.  ------------------------------------------------------------------- Pulmonic valve:   Poorly visualized.  Structurally normal valve. Cusp separation was normal.  Doppler:  Transvalvular velocity was within the normal range. There was no evidence for stenosis. There was trivial regurgitation.  ------------------------------------------------------------------- Tricuspid valve:   Structurally normal valve.    Doppler: Transvalvular velocity was within the normal range. There was no regurgitation.  ------------------------------------------------------------------- Pulmonary artery:   The main pulmonary artery was normal-sized. Systolic pressure was within the normal  range.  ------------------------------------------------------------------- Right atrium:  The atrium was normal in size.  ------------------------------------------------------------------- Pericardium:  There was no pericardial effusion.  ------------------------------------------------------------------- Systemic veins: Inferior vena cava: The vessel was normal in size. The respirophasic diameter changes were in the normal range (= 50%), consistent with normal  [id number] Study Date: 12/07/2017 Gender:     F Age:        77 Height:     166.4 cm Weight:     86.6 kg BSA:        2.03 m^2 Pt. Status: Room:  [facility] REFERRING    Abigail [name] ATTENDING    Donato Schultz, M.D. SONOGRAPHER  Clearence Ped, RCS PERFORMING   [facility],  Outpatient  cc:  ------------------------------------------------------------------- LV EF: 65% -   70%  ------------------------------------------------------------------- Indications:      Precordial Pain (R07.2).  ------------------------------------------------------------------- History:   PMH:  small PFO on TEE, hyperlipidemia, Palpitations. Transient ischemic attack.  Risk factors:  Hypertension.  ------------------------------------------------------------------- Study Conclusions  - Left ventricle: The cavity size was normal. There was mild focal basal hypertrophy of the septum. Systolic function was vigorous. The estimated ejection fraction was in the range of 65% to 70%. Wall motion was normal; there were no regional wall motion abnormalities. Doppler parameters are consistent with abnormal left ventricular relaxation (grade 1 diastolic dysfunction). - Aortic valve: Trileaflet; mildly thickened, mildly calcified leaflets.  ------------------------------------------------------------------- Study data:  No prior study was available for comparison.  Study status:  Routine.  Procedure:  The patient reported no pain pre or post test. Transthoracic echocardiography. Image quality was adequate.          Transthoracic echocardiography.  M-mode, complete 2D, spectral Doppler, and color Doppler.  Birthdate: Patient birthdate: 05/31/1946.  Age:  Patient is 77 yr old.  Sex: Gender: female.    BMI: 31.3 kg/m^2.  Blood pressure:     118/70 Patient status:  Outpatient.  Study date:  Study date: 12/07/2017. Study time: 01:12 PM.  Location:  [facility]  -------------------------------------------------------------------  ------------------------------------------------------------------- Left ventricle:  The cavity size was normal. There was mild focal basal hypertrophy of the septum. Systolic function was vigorous. The estimated ejection fraction was in the range of 65%  to 70%. Wall motion was normal; there were no regional wall motion abnormalities. Doppler parameters are consistent with abnormal left ventricular relaxation (grade 1 diastolic dysfunction).  ------------------------------------------------------------------- Aortic valve:   Trileaflet; mildly thickened, mildly calcified leaflets. Mobility was not restricted.  Doppler:  Transvalvular velocity was within the normal range. There was no stenosis. There was no regurgitation.  ------------------------------------------------------------------- Aorta:  Aortic root: The aortic root was normal in size.  ------------------------------------------------------------------- Mitral valve:   Structurally normal valve.   Mobility was not restricted.  Doppler:  Transvalvular velocity was within the normal range. There was no evidence for stenosis. There was no regurgitation.  ------------------------------------------------------------------- Left atrium:  The atrium was normal in size.  ------------------------------------------------------------------- Right ventricle:  The cavity size was normal. Wall thickness was normal. Systolic function was normal.  ------------------------------------------------------------------- Pulmonic valve:   Poorly visualized.  Structurally normal valve. Cusp separation was normal.  Doppler:  Transvalvular velocity was within the normal range. There was no evidence for stenosis. There was trivial regurgitation.  ------------------------------------------------------------------- Tricuspid valve:   Structurally normal valve.    Doppler: Transvalvular velocity was within the normal range. There was no regurgitation.  ------------------------------------------------------------------- Pulmonary artery:   The main pulmonary artery was normal-sized. Systolic pressure was within the normal  range.  ------------------------------------------------------------------- Right atrium:  The atrium was normal in size.  ------------------------------------------------------------------- Pericardium:  There was no pericardial effusion.  ------------------------------------------------------------------- Systemic veins: Inferior vena cava: The vessel was normal in size. The respirophasic diameter changes were in the normal range (= 50%), consistent with normal  Cardiology Office Note:  .   Date:  06/27/2023  ID:  Abigail [name], DOB September 01, 1946, MRN [id number] PCP: Nelwyn Salisbury, MD  [facility] Providers Cardiologist:  Abigail Millers, MD     History of Present Illness: .   Abigail [name] is a 77 y.o. female with PMH of CVA, palpitation and presyncope.  She had PFO on TEE in 2006 after CVA.  Cardiac catheterization in September 2016 showed no CAD and normal EF.  Echocardiogram in March 2019 demonstrated normal EF, mild diastolic dysfunction.  Carotid Doppler in July 2020 showed 1 to 39% bilateral disease.  Heart monitor in December 2022 showed normal sinus rhythm, PACs, brief PAT, rare PVC and a 9 beats of nonsustained VT.  Cardiac CTA in January 2024 showed coronary calcium score of 58 which was 77 percentile and a mild stenosis in the second diagonal and RCA.   Patient was last seen by Dr. Jens Som in March 2024 at which time she had dyspnea associated with palpitation at times.  Dr. Jens Som recommended she consider AliveCor or Apple Watch to help monitor palpitation.  Recent venous Doppler obtained in July 2024 was negative for DVT.  She underwent lumbar surgery in July.  Patient presents today for follow-up.  Her primary concern is dyspnea with exertion.  She denies any chest pain.  My suspicion that her dyspnea on exertion is cardiac is fairly low, I will obtain an echocardiogram to confirm.  If echocardiogram is normal, all the possibility contribute to the shortness of breath with exertion include pulmonary versus deconditioning.  Since her back surgery, she has been doing physical therapy which I think will help.  If echocardiogram is normal, she can follow-up in 6 to 9 months.    ROS:   Patient complains of dyspnea on exertion but no chest pain, lower extremity edema, thumping or PND.  Studies Reviewed: .        Cardiac Studies & Procedures   CARDIAC CATHETERIZATION  CARDIAC CATHETERIZATION 06/01/2015  Narrative  Prox RCA lesion, 10%  stenosed.  Prox Cx lesion, 10% stenosed.  Prox LAD to Dist LAD lesion, 10% stenosed.  The left ventricular systolic function is normal.  1. Mild non-obstructive CAD 2. Normal LV systolic function 3. Non-cardiac chest pain  Recommendations: Medical management of mild CAD.  Findings Coronary Findings Diagnostic  Dominance: Right  Left Anterior Descending diffuse .  First Diagonal Branch The vessel is moderate in size.  First Septal Branch The vessel is small in size.  Second Diagonal Branch The vessel is small in size.  Left Circumflex The vessel is large . discrete .  Second Obtuse Marginal Branch The vessel is moderate in size.  Right Coronary Artery diffuse .  Intervention  No interventions have been documented.   STRESS TESTS  MYOCARDIAL PERFUSION IMAGING 05/22/2015  Narrative  The left ventricular ejection fraction is mildly decreased (45-54%).  Nuclear stress EF: 54%.  There was no ST segment deviation noted during stress.  This is a low risk study.  Low risk stress nuclear study with a small, severe, fixed apical defect and a small, severe, reversible inferior lateral defect; findings consistent with apical thinning and mild ischemia in the inferior lateral wall; EF 54 and normal wall motion.   ECHOCARDIOGRAM  ECHOCARDIOGRAM COMPLETE 12/07/2017  Narrative *[facility]* [address] [place], [place] [address] [phone]  ------------------------------------------------------------------- Transthoracic Echocardiography  Patient:    [place], Leon MR #:

## 2023-06-27 NOTE — Patient Instructions (Signed)
Medication Instructions:  NO CHANGES *If you need a refill on your cardiac medications before your next appointment, please call your pharmacy*   Lab Work: NO LABS If you have labs (blood work) drawn today and your tests are completely normal, you will receive your results only by: MyChart Message (if you have MyChart) OR A paper copy in the mail If you have any lab test that is abnormal or we need to change your treatment, we will call you to review the results.   Testing/Procedures:[address] Your physician has requested that you have an echocardiogram. Echocardiography is a painless test that uses sound waves to create images of your heart. It provides your doctor with information about the size and shape of your heart and how well your heart's chambers and valves are working. This procedure takes approximately one hour. There are no restrictions for this procedure. Please do NOT wear cologne, perfume, aftershave, or lotions (deodorant is allowed). Please arrive 15 minutes prior to your appointment time.   Follow-Up: At [facility], you and your health needs are our priority.  As part of our continuing mission to provide you with exceptional heart care, we have created designated Provider Care Teams.  These Care Teams include your primary Cardiologist (physician) and Advanced Practice Providers (APPs -  Physician Assistants and Nurse Practitioners) who all work together to provide you with the care you need, when you need it.    Your next appointment:   6 month(s)  Provider:   [name], MD

## 2023-06-29 ENCOUNTER — Ambulatory Visit
Admission: RE | Admit: 2023-06-29 | Discharge: 2023-06-29 | Disposition: A | Discharge status: Home / Self Care | Payer: Medicare PPO | Source: Ambulatory Visit | Attending: Family Medicine | Admitting: Family Medicine

## 2023-06-29 DIAGNOSIS — N6321 Unspecified lump in the left breast, upper outer quadrant: Secondary | ICD-10-CM | POA: Diagnosis not present

## 2023-06-29 DIAGNOSIS — D0512 Intraductal carcinoma in situ of left breast: Secondary | ICD-10-CM | POA: Diagnosis not present

## 2023-06-29 DIAGNOSIS — R921 Mammographic calcification found on diagnostic imaging of breast: Secondary | ICD-10-CM

## 2023-06-29 HISTORY — PX: BREAST BIOPSY: SHX20

## 2023-06-30 ENCOUNTER — Telehealth: Payer: Self-pay | Admitting: *Deleted

## 2023-06-30 LAB — SURGICAL PATHOLOGY

## 2023-06-30 MED ORDER — KETOCONAZOLE 2 % EX CREA: 1.0000 | TOPICAL_CREAM | Freq: Two times a day (BID) | CUTANEOUS | 2 refills | Status: AC

## 2023-06-30 NOTE — Addendum Note (Signed)
Addended by: Gershon Crane A on: 06/30/2023 03:27 PM   Modules accepted: Orders

## 2023-06-30 NOTE — Telephone Encounter (Signed)
Spoke with patient to schedule appt for [facility] 10/16 at 1215pm. Contact information given.

## 2023-07-03 ENCOUNTER — Other Ambulatory Visit: Payer: Self-pay | Admitting: Family Medicine

## 2023-07-03 ENCOUNTER — Encounter: Payer: Self-pay | Admitting: *Deleted

## 2023-07-03 DIAGNOSIS — E7800 Pure hypercholesterolemia, unspecified: Secondary | ICD-10-CM

## 2023-07-03 DIAGNOSIS — D0512 Intraductal carcinoma in situ of left breast: Secondary | ICD-10-CM | POA: Insufficient documentation

## 2023-07-04 NOTE — Progress Notes (Signed)
Radiation Oncology         [phone] ________________________________  Initial Outpatient Consultation  Name: Abigail [name] MRN: [id number]  Date: 07/05/2023  DOB: 28-Sep-1945  CC:Fry, Tera Mater, MD  Almond Lint, MD   REFERRING PHYSICIAN: Almond Lint, MD  DIAGNOSIS: No diagnosis found.   Cancer Staging  No matching staging information was found for the patient.   Stage 0 (cTis (DCIS), cN0, cM0) Outer Left Breast, Intermediate grade DCIS, ER+ / PR+ / Her2 not assessed  CHIEF COMPLAINT: Here to discuss management of left breast cancer  HISTORY OF PRESENT ILLNESS::Abigail [name] is a 77 y.o. female who presented with a left breast abnormality on the following imaging: bilateral screening mammogram on the date of 06/14/23. No symptoms, if any, were reported at that time. Left breast diagnostic mammogram on 06/23/23 further revealed a suspicious group of calcifications in the outer left breast spanning approximately 1.7 cm. No abnormal appearing left axillary lymph nodes were appreciated, however this is in the absence of sonographic evaluation.   Biopsy of the outer left breast calcifications on date of 06/29/23 showed intermediate grade DCIS measuring 4.3 mm in the greatest linear extent of the sample with focal necrosis, and involving a small intraductal papilloma.  ER status: 90% positive with strong staining intensity; PR status 20% positive with weak-moderate staining intensity; Her2 not assessed. No lymph nodes were examined.   ***  PREVIOUS RADIATION THERAPY: No  PAST MEDICAL HISTORY:  has a past medical history of Bronchitis (09/2016), Bruises easily, DDD (degenerative disc disease), Degenerative joint disease of cervical spine (09-05-11), Depression, Diverticulitis (09-05-11), DIVERTICULITIS, ACUTE (02/10/2010), Dyspnea, Essential hypertension (09-05-11), GROIN PAIN (09/21/2010), Headache(784.0), Hearing loss, Heart palpitations, Hemorrhoids, History of bronchitis, History of  colonic polyps, History of pneumonia, History of tension headache, Hyperlipidemia, Hypertension, Hypothyroidism (09-05-11), Late effect of adverse effect of drug, medicinal or biological substance, NECK PAIN, ACUTE (09/21/2010), Neuromuscular disorder (HCC), Nocturia, Osteoarthritis (112-17-12), Patent foramen ovale, Pneumonia, PONV (postoperative nausea and vomiting), Sleep apnea (09-05-11), SORE THROAT (04/30/2009), Stroke (HCC) (09-05-11), TIA (09/28/2007, 10/2014), Unstable angina (HCC), URI (09/02/2008), and Vertigo.    PAST SURGICAL HISTORY: Past Surgical History:  Procedure Laterality Date   ABDOMINAL HYSTERECTOMY     ANTERIOR LAT LUMBAR FUSION Left 04/05/2023   Procedure: LEFT-SIDED LUMBAR 2- LUMBAR 3, LUMBAR 3- LUMBAR 4 LATERAL INTERBODY FUSION WITH INSTRUMENTATION AND ALLOGRAFT;  Surgeon: Estill Bamberg, MD;  Location: [facility];  Service: Orthopedics;  Laterality: Left;   BACK SURGERY     BREAST BIOPSY Left 06/29/2023   MM LT BREAST BX W LOC DEV 1ST LESION IMAGE BX SPEC STEREO GUIDE 06/29/2023 [facility] MAMMOGRAPHY   CARDIAC CATHETERIZATION N/A 06/01/2015   Procedure: Left Heart Cath and Coronary Angiography;  Surgeon: Kathleene Hazel, MD;  Location: [facility] INVASIVE CV LAB;  Service: Cardiovascular;  Laterality: N/A;   COLON RESECTION  09/07/2011   Procedure: LAPAROSCOPIC SIGMOID COLON RESECTION;  Surgeon: Mariella Saa, MD;  Location: [facility] ORS;  Service: General;  Laterality: N/A;  with proctoscopy   COLONOSCOPY  08/06/2015   per Dr. Kinnie Scales, adenomatous polyps, repeat in 5 yrs    CYST EXCISION N/A 11/29/2016   Procedure: EXCISION UMBILICAL CYST;  Surgeon: Glenna Fellows, MD;  Location: [facility];  Service: General;  Laterality: N/A;   ELBOW SURGERY  09-05-11   left elbow -ligament repair   EYE SURGERY     cataract   INSERTION OF MESH N/A 12/13/2013   Procedure: INSERTION OF MESH;  Surgeon: Mariella Saa,  MD;  Location: [facility];  Service: General;  Laterality: N/A;   LAPAROSCOPY   09/07/2011   Procedure: LAPAROSCOPY DIAGNOSTIC;  Surgeon: Roselle Locus II;  Location: [facility];  Service: Gynecology;  Laterality: N/A;   MAXIMUM ACCESS (MAS)POSTERIOR LUMBAR INTERBODY FUSION (PLIF) 2 LEVEL N/A 05/03/2016   Procedure: L4-5 L5-S1 Maximum access posterior lumbar interbody fusion;  Surgeon: Maeola Harman, MD;  Location: [facility];  Service: Neurosurgery;  Laterality: N/A;  L4-5 L5-S1 Maximum access posterior lumbar interbody fusion   SALPINGOOPHORECTOMY  09/07/2011   Procedure: SALPINGO OOPHERECTOMY;  Surgeon: Roselle Locus II;  Location: [facility];  Service: Gynecology;  Laterality: Right;   THYROIDECTOMY, PARTIAL  09-05-11   TOTAL HIP ARTHROPLASTY Right 08/21/2018   Procedure: TOTAL HIP ARTHROPLASTY ANTERIOR APPROACH;  Surgeon: Marcene Corning, MD;  Location: [facility];  Service: Orthopedics;  Laterality: Right;   TUBAL LIGATION  1983   UMBILICAL HERNIA REPAIR  yrs ago   VENTRAL HERNIA REPAIR  06/20/2012   Procedure: LAPAROSCOPIC VENTRAL HERNIA;  Surgeon: Mariella Saa, MD;  Location: [facility];  Service: General;  Laterality: N/A;  Laparoscopic Repair of Ventral Hernia with mesh   VENTRAL HERNIA REPAIR N/A 12/13/2013   Procedure: LAPAROSCOPIC REPAIR RECURRENT VENTRAL INCISIONAL  HERNIA;  Surgeon: Mariella Saa, MD;  Location: [facility];  Service: General;  Laterality: N/A;    FAMILY HISTORY: family history includes Arrhythmia in her mother; Breast cancer in her sister and sister; Diabetes in her paternal grandmother; Hypertension in her mother; Stroke in her maternal grandmother and mother.  SOCIAL HISTORY:  reports that she quit smoking about 54 years ago. Her smoking use included cigarettes. She started smoking about 59 years ago. She has a 2.5 pack-year smoking history. She has never used smokeless tobacco. She reports current alcohol use of about 2.0 standard drinks of alcohol per week. She reports that she does not use drugs.  ALLERGIES: Dilaudid [hydromorphone hcl],  Shellfish allergy, Aggrenox [aspirin-dipyridamole er], Codeine, Doxycycline monohydrate, Cephalexin, Codeine phosphate, Hydrocodone, Meperidine hcl, and Percocet [oxycodone-acetaminophen]  MEDICATIONS:  Current Outpatient Medications  Medication Sig Dispense Refill   amLODipine (NORVASC) 10 MG tablet TAKE 1 TABLET BY MOUTH EVERY DAY 90 tablet 1   amoxicillin-clavulanate (AUGMENTIN) 875-125 MG tablet Take 1 tablet by mouth 2 (two) times daily. 20 tablet 0   atorvastatin (LIPITOR) 40 MG tablet TAKE 1 TABLET BY MOUTH EVERY DAY 30 tablet 0   butalbital-acetaminophen-caffeine (FIORICET) 50-325-40 MG tablet TAKE 1 TABLET BY MOUTH EVERY 6 HOURS AS NEEDED FOR HEADACHE 60 tablet 5   clopidogrel (PLAVIX) 75 MG tablet Take 75 mg by mouth daily.     ketoconazole (NIZORAL) 2 % cream Apply 1 Application topically 2 (two) times daily. 30 g 2   levothyroxine (SYNTHROID) 125 MCG tablet TAKE 1 TABLET BY MOUTH EVERY DAY BEFORE BREAKFAST 90 tablet 1   losartan (COZAAR) 100 MG tablet TAKE 1 TABLET BY MOUTH EVERY DAY 90 tablet 3   methocarbamol (ROBAXIN) 500 MG tablet Take 1 tablet (500 mg total) by mouth every 6 (six) hours as needed for muscle spasms. 30 tablet 2   metoprolol succinate (TOPROL-XL) 25 MG 24 hr tablet TAKE 1 TABLET BY MOUTH EVERY DAY. (NEED APPT FOR REFILLS) 90 tablet 1   ondansetron (ZOFRAN) 8 MG tablet Take 1 tablet (8 mg total) by mouth every 8 (eight) hours as needed for nausea or vomiting. 60 tablet 0   oxyCODONE-acetaminophen (PERCOCET/ROXICET) 5-325 MG tablet Take 1-2 tablets by mouth every 4 (four)  hours as needed for severe pain. 30 tablet 0   potassium chloride (KLOR-CON 10) 10 MEQ tablet Take 1 tablet (10 mEq total) by mouth 2 (two) times daily. Take 2 tab twice daily for 5 days 20 tablet 0   potassium chloride (KLOR-CON M) 10 MEQ tablet Take 2 tablets twice a day 360 tablet 3   traMADol (ULTRAM) 50 MG tablet TAKE 1 TO 2 TABLETS BY MOUTH EVERY 4 TO 6 HOURS AS NEEDED FOR PAIN 120 tablet 5    No current facility-administered medications for this encounter.    REVIEW OF SYSTEMS: As above in HPI.   PHYSICAL EXAM:  vitals were not taken for this visit.   General: Alert and oriented, in no acute distress HEENT: Head is normocephalic. Extraocular movements are intact. Oropharynx is clear. Neck: Neck is supple, no palpable cervical or supraclavicular lymphadenopathy. Heart: Regular in rate and rhythm with no murmurs, rubs, or gallops. Chest: Clear to auscultation bilaterally, with no rhonchi, wheezes, or rales. Abdomen: Soft, nontender, nondistended, with no rigidity or guarding. Extremities: No cyanosis or edema. Lymphatics: see Neck Exam Skin: No concerning lesions. Musculoskeletal: symmetric strength and muscle tone throughout. Neurologic: Cranial nerves II through XII are grossly intact. No obvious focalities. Speech is fluent. Coordination is intact. Psychiatric: Judgment and insight are intact. Affect is appropriate. Breasts: *** . No other palpable masses appreciated in the breasts or axillae *** .    ECOG = ***  0 - Asymptomatic (Fully active, able to carry on all predisease activities without restriction)  1 - Symptomatic but completely ambulatory (Restricted in physically strenuous activity but ambulatory and able to carry out work of a light or sedentary nature. For example, light housework, office work)  2 - Symptomatic, <50% in bed during the day (Ambulatory and capable of all self care but unable to carry out any work activities. Up and about more than 50% of waking hours)  3 - Symptomatic, >50% in bed, but not bedbound (Capable of only limited self-care, confined to bed or chair 50% or more of waking hours)  4 - Bedbound (Completely disabled. Cannot carry on any self-care. Totally confined to bed or chair)  5 - Death   Santiago Glad MM, Creech RH, Tormey DC, et al. [phone]). "Toxicity and response criteria of the [facility]". Am. Evlyn Clines.  Oncol. 5 (6): 649-55   LABORATORY DATA:  Lab Results  Component Value Date   WBC 6.9 04/26/2023   HGB 12.2 04/26/2023   HCT 37.6 04/26/2023   MCV 89.4 04/26/2023   PLT 232.0 04/26/2023   CMP     Component Value Date/Time   NA 140 05/09/2023 1027   NA 143 08/30/2022 1155   K 3.1 (L) 05/09/2023 1027   CL 105 05/09/2023 1027   CO2 24 05/09/2023 1027   GLUCOSE 141 (H) 05/09/2023 1027   BUN 10 05/09/2023 1027   BUN 12 08/30/2022 1155   CREATININE 1.03 05/09/2023 1027   CREATININE 0.73 04/01/2020 1402   CALCIUM 9.8 05/09/2023 1027   PROT 7.9 04/26/2023 1444   PROT 7.5 08/20/2021 1035   ALBUMIN 3.9 04/26/2023 1444   ALBUMIN 4.3 08/20/2021 1035   AST 16 04/26/2023 1444   ALT 12 04/26/2023 1444   ALKPHOS 110 04/26/2023 1444   BILITOT 0.5 04/26/2023 1444   BILITOT 0.3 08/20/2021 1035   GFRNONAA >60 03/28/2023 0930   GFRAA 100 08/27/2020 1238         RADIOGRAPHY: MM LT BREAST BX W LOC  DEV 1ST LESION IMAGE BX SPEC STEREO GUIDE  Addendum Date: 07/03/2023   ADDENDUM REPORT: 07/03/2023 16:03 ADDENDUM: Pathology revealed DUCTAL CARCINOMA IN SITU, INTERMEDIATE NUCLEAR GRADE, CRIBRIFORM TYPE WITH FOCAL NECROSIS, DCIS FOCALLY INVOLVES A SMALL INTRADUCTAL PAPILLOMA, MICROCALCIFICATIONS PRESENT WITHIN DCIS AND PERITUMORAL STROMA of the LEFT breast, outer, (coil clip). This was found to be concordant by Dr. Emmaline Kluver. Pathology results were discussed with the patient by telephone. The patient reported doing well after the biopsy with tenderness and bruising at the site. The patient reported additional bleeding throughout the night. She removed the compression bandage applied at [facility] post-biopsy, per patient. Post biopsy instructions and care were reviewed and questions were answered. The patient was encouraged to call [facility] for any additional concerns. My direct phone number was provided and the patient was instructed to call or text me  before the end of the day, June 30, 2023, if she was still bleeding. The patient was referred to [facility] on July 05, 2023. Pathology results reported by Rene Kocher, RN on 06/30/2023. Electronically Signed   By: Emmaline Kluver M.D.   On: 07/03/2023 16:03   Addendum Date: 06/29/2023   ADDENDUM REPORT: 06/29/2023 12:00 ADDENDUM: Patient has a moderate size left breast hematoma following biopsy. A compression bandage was applied and bleeding had completely stopped prior to the patient leaving the office. Patient was given home care instructions. Electronically Signed   By: Emmaline Kluver M.D.   On: 06/29/2023 12:00   Result Date: 07/03/2023 CLINICAL DATA:  77 year old female presenting for biopsy of calcifications in the left breast. EXAM: LEFT BREAST STEREOTACTIC CORE NEEDLE BIOPSY COMPARISON:  Previous exam(s). FINDINGS: The patient and I discussed the procedure of stereotactic-guided biopsy including benefits and alternatives. We discussed the high likelihood of a successful procedure. We discussed the risks of the procedure including infection, bleeding, tissue injury, clip migration, and inadequate sampling. Informed written consent was given. The usual time out protocol was performed immediately prior to the procedure. Using sterile technique and 1% Lidocaine as local anesthetic, under stereotactic guidance, a 9 gauge vacuum assisted device was used to perform core needle biopsy of calcifications in the outer left breast using a lateral approach. Specimen radiograph was performed showing several specimens with calcifications. Specimens with calcifications are identified for pathology. Lesion quadrant: Upper outer quadrant At the conclusion of the procedure, a coil shaped tissue marker clip was deployed into the biopsy cavity. Follow-up 2-view mammogram was performed and dictated separately. IMPRESSION:  Stereotactic-guided biopsy of calcifications in the outer left breast. No apparent complications. Electronically Signed: By: Emmaline Kluver M.D. On: 06/29/2023 11:33   MM CLIP PLACEMENT LEFT  Result Date: 06/29/2023 CLINICAL DATA:  Post procedure mammogram for clip placement EXAM: 3D DIAGNOSTIC LEFT MAMMOGRAM POST STEREOTACTIC BIOPSY COMPARISON:  Previous exam(s). FINDINGS: 3D Mammographic images were obtained following stereotactic guided biopsy of calcifications in the outer left breast. The coil biopsy marking clip is in expected position at the site of biopsy. IMPRESSION: Appropriate positioning of the coil shaped biopsy marking clip at the site of biopsy in the outer left breast. Final Assessment: Post Procedure Mammograms for Marker Placement BI-RADS CATEGORY  21M: Post-Procedure Mammogram for Marker Placement Electronically Signed   By: Emmaline Kluver M.D.   On: 06/29/2023 11:28   MM Digital Diagnostic Unilat L  Result Date: 06/23/2023 CLINICAL DATA:  Screening recall for left breast calcifications. Patient has  a strong family history of breast cancer, including a sister having been diagnosed with breast cancer. EXAM: DIGITAL DIAGNOSTIC UNILATERAL LEFT MAMMOGRAM WITH CAD TECHNIQUE: Left digital diagnostic mammography was performed. COMPARISON:  Previous exam(s). ACR Breast Density Category b: There are scattered areas of fibroglandular density. FINDINGS: Spot compression magnification views were performed of the outer left breast demonstrating a 1.7 cm somewhat linear oriented group of pleomorphic calcifications. IMPRESSION: Suspicious 1.7 cm group of calcifications in the outer left breast. RECOMMENDATION: Recommend stereotactic guided biopsy of the calcifications in the outer left breast. I have discussed the findings and recommendations with the patient. If applicable, a reminder letter will be sent to the patient regarding the next appointment. BI-RADS CATEGORY  4: Suspicious. Electronically  Signed   By: Edwin Cap M.D.   On: 06/23/2023 14:37   MM 3D SCREENING MAMMOGRAM BILATERAL BREAST  Result Date: 06/15/2023 CLINICAL DATA:  Screening. EXAM: DIGITAL SCREENING BILATERAL MAMMOGRAM WITH TOMOSYNTHESIS AND CAD TECHNIQUE: Bilateral screening digital craniocaudal and mediolateral oblique mammograms were obtained. Bilateral screening digital breast tomosynthesis was performed. The images were evaluated with computer-aided detection. COMPARISON:  Previous exam(s). ACR Breast Density Category b: There are scattered areas of fibroglandular density. FINDINGS: In the left breast, calcifications warrant further evaluation. In the right breast, no findings suspicious for malignancy. IMPRESSION: Further evaluation is suggested for calcifications in the left breast. RECOMMENDATION: Diagnostic mammogram of the left breast. (Code:[id number]-L-62M) The patient will be contacted regarding the findings, and additional imaging will be scheduled. BI-RADS CATEGORY  0: Incomplete: Need additional imaging evaluation. Electronically Signed   By: Elberta Fortis M.D.   On: 06/15/2023 11:31      IMPRESSION/PLAN: ***   It was a pleasure meeting the patient today. We discussed the risks, benefits, and side effects of radiotherapy. I recommend radiotherapy to the *** to reduce her risk of locoregional recurrence by 2/3.  We discussed that radiation would take approximately *** weeks to complete and that I would give the patient a few weeks to heal following surgery before starting treatment planning. *** If chemotherapy were to be given, this would precede radiotherapy. We spoke about acute effects including skin irritation and fatigue as well as much less common late effects including internal organ injury or irritation. We spoke about the latest technology that is used to minimize the risk of late effects for patients undergoing radiotherapy to the breast or chest wall. No guarantees of treatment were given. The patient is  enthusiastic about proceeding with treatment. I look forward to participating in the patient's care.  I will await her referral back to me for postoperative follow-up and eventual CT simulation/treatment planning.  On date of service, in total, I spent *** minutes on this encounter. Patient was seen in person.   __________________________________________   Lonie Peak, MD  This document serves as a record of services personally performed by Lonie Peak, MD. It was created on her behalf by Neena Rhymes, a trained medical scribe. The creation of this record is based on the scribe's personal observations and the provider's statements to them. This document has been checked and approved by the attending provider.

## 2023-07-05 ENCOUNTER — Encounter: Payer: Self-pay | Admitting: General Practice

## 2023-07-05 ENCOUNTER — Inpatient Hospital Stay: Payer: Medicare PPO | Admitting: Hematology

## 2023-07-05 ENCOUNTER — Ambulatory Visit
Admission: RE | Admit: 2023-07-05 | Discharge: 2023-07-05 | Disposition: A | Discharge status: Home / Self Care | Payer: Medicare PPO | Source: Ambulatory Visit | Attending: Radiation Oncology | Admitting: Radiation Oncology

## 2023-07-05 ENCOUNTER — Ambulatory Visit: Payer: Medicare PPO | Admitting: Physical Therapy

## 2023-07-05 ENCOUNTER — Inpatient Hospital Stay: Payer: Medicare PPO | Admitting: Genetic Counselor

## 2023-07-05 ENCOUNTER — Inpatient Hospital Stay: Payer: Medicare PPO | Attending: Hematology

## 2023-07-05 ENCOUNTER — Other Ambulatory Visit: Payer: Self-pay | Admitting: General Surgery

## 2023-07-05 ENCOUNTER — Encounter: Payer: Self-pay | Admitting: Hematology

## 2023-07-05 VITALS — BP 145/66 | HR 64 | Temp 97.3°F | Resp 18 | Ht 65.0 in | Wt 198.6 lb

## 2023-07-05 DIAGNOSIS — C50412 Malignant neoplasm of upper-outer quadrant of left female breast: Secondary | ICD-10-CM | POA: Diagnosis not present

## 2023-07-05 DIAGNOSIS — Z79899 Other long term (current) drug therapy: Secondary | ICD-10-CM | POA: Diagnosis not present

## 2023-07-05 DIAGNOSIS — D0512 Intraductal carcinoma in situ of left breast: Secondary | ICD-10-CM

## 2023-07-05 DIAGNOSIS — Z8042 Family history of malignant neoplasm of prostate: Secondary | ICD-10-CM

## 2023-07-05 DIAGNOSIS — Z8673 Personal history of transient ischemic attack (TIA), and cerebral infarction without residual deficits: Secondary | ICD-10-CM | POA: Diagnosis not present

## 2023-07-05 DIAGNOSIS — Z7902 Long term (current) use of antithrombotics/antiplatelets: Secondary | ICD-10-CM | POA: Diagnosis not present

## 2023-07-05 DIAGNOSIS — Z170 Estrogen receptor positive status [ER+]: Secondary | ICD-10-CM | POA: Diagnosis not present

## 2023-07-05 DIAGNOSIS — Z803 Family history of malignant neoplasm of breast: Secondary | ICD-10-CM

## 2023-07-05 DIAGNOSIS — Z853 Personal history of malignant neoplasm of breast: Secondary | ICD-10-CM | POA: Diagnosis not present

## 2023-07-05 LAB — CBC WITH DIFFERENTIAL (CANCER CENTER ONLY)
Abs Immature Granulocytes: 0.01 10*3/uL (ref 0.00–0.07)
Basophils Absolute: 0 10*3/uL (ref 0.0–0.1)
Basophils Relative: 1 %
Eosinophils Absolute: 0.1 10*3/uL (ref 0.0–0.5)
Eosinophils Relative: 1 %
HCT: 36.5 % (ref 36.0–46.0)
Hemoglobin: 12 g/dL (ref 12.0–15.0)
Immature Granulocytes: 0 %
Lymphocytes Relative: 28 %
Lymphs Abs: 1.4 10*3/uL (ref 0.7–4.0)
MCH: 29.3 pg (ref 26.0–34.0)
MCHC: 32.9 g/dL (ref 30.0–36.0)
MCV: 89.2 fL (ref 80.0–100.0)
Monocytes Absolute: 0.8 10*3/uL (ref 0.1–1.0)
Monocytes Relative: 15 %
Neutro Abs: 2.7 10*3/uL (ref 1.7–7.7)
Neutrophils Relative %: 55 %
Platelet Count: 171 10*3/uL (ref 150–400)
RBC: 4.09 MIL/uL (ref 3.87–5.11)
RDW: 14.1 % (ref 11.5–15.5)
WBC Count: 5 10*3/uL (ref 4.0–10.5)
nRBC: 0 % (ref 0.0–0.2)

## 2023-07-05 LAB — CMP (CANCER CENTER ONLY)
ALT: 7 U/L (ref 0–44)
AST: 9 U/L — ABNORMAL LOW (ref 15–41)
Albumin: 3.9 g/dL (ref 3.5–5.0)
Alkaline Phosphatase: 123 U/L (ref 38–126)
Anion gap: 8 (ref 5–15)
BUN: 8 mg/dL (ref 8–23)
CO2: 25 mmol/L (ref 22–32)
Calcium: 9.6 mg/dL (ref 8.9–10.3)
Chloride: 109 mmol/L (ref 98–111)
Creatinine: 0.75 mg/dL (ref 0.44–1.00)
GFR, Estimated: >60 mL/min (ref >60)
Glucose, Bld: 100 mg/dL — ABNORMAL HIGH (ref 70–99)
Potassium: 3.3 mmol/L — ABNORMAL LOW (ref 3.5–5.1)
Sodium: 142 mmol/L (ref 135–145)
Total Bilirubin: 0.4 mg/dL (ref 0.3–1.2)
Total Protein: 7.6 g/dL (ref 6.5–8.1)

## 2023-07-05 LAB — GENETIC SCREENING ORDER

## 2023-07-05 NOTE — Progress Notes (Signed)
[facility] Spiritual Care Note  Met with Abigail [name] and her husband [name] in [facility] Mult[facility]isciplinary Clinic to introduce Support Center team/resources.  she completed SDOH screening; results follow below.    SDOH Screenings   Food Insecurity: No Food Insecurity (07/05/2023)  Housing: Low Risk  (07/05/2023)  Transportation Needs: No Transportation Needs (07/05/2023)  Utilities: Not At Risk (07/05/2023)  Depression (PHQ2-9): Low Risk  (04/26/2023)  Tobacco Use: Medium Risk (07/05/2023)    Chaplain and patient discussed common feelings and emotions when being diagnosed with cancer, and the importance of support during treatment.  Chaplain informed patient of the support team and support services at [facility].  Chaplain provided contact information and encouraged patient to call with any questions or concerns.  [name] notes that her faith is strong and that she has had many other health challenges that have prepared her for coping with another, though this particular diagnosis was a big surprise.  Follow up needed: Yes. We plan to follow up by phone in ca two weeks.   [address] [place], [place], [facility] Pager [phone] Voicemail [phone]

## 2023-07-05 NOTE — Progress Notes (Signed)
[facility]   [phone] [phone]   Clinic New Consult Note   Patient Care Team: Nelwyn Salisbury, MD as PCP - General (Family Medicine) Jens Som Madolyn Frieze, MD as PCP - Cardiology (Cardiology) Pershing Proud, RN as Registered Nurse Donnelly Angelica, RN as Registered Nurse Malachy Mood, MD as Consulting Physician (Hematology) Lonie Peak, MD as Attending Physician (Radiation Oncology) Almond Lint, MD as Consulting Physician (General Surgery) 07/05/2023  CHIEF COMPLAINTS/PURPOSE OF CONSULTATION:  Newly diagnosed left breat DCIS    Discussed the use of AI scribe software for clinical note transcription with the patient, who gave verbal consent to proceed.   HISTORY OF PRESENTING ILLNESS:  Abigail [name] 77 y.o. female is here because of newly diagnosed left breast DCIS. She presents to clinic with her husband.   This was discovered during a routine mammogram. The patient reports no noticeable changes in her breasts, such as skin changes, nipple discharge, or pain. She has been recovering from a recent back surgery, which has limited her mobility and caused ongoing back pain. She has just begun physical therapy for this issue. The patient also reports a history of stroke in 2006, for which she is taking Plavix. She has a history of chronic diverticulitis, which led to a colon resection. She also has high blood pressure, a thyroid disorder, and sleep apnea, for which she had surgery and no longer requires medication. She denies current depression but does report occasional vertigo. She has had a hysterectomy due to bleeding and has had multiple other surgeries, including a colon resection, hip replacement, thyroid surgery, and hernia repair. She has a family history of breast cancer, with at least one sister having had the disease.   Her diagnostic mammogram and ultrasound showed a 1.7 cm area of calcification in the upper outer quadrant of the left breast,  biopsy showed ductal carcinoma in situ, grade 2, with focal necrosis.  ER 90% positive, PR 20% positive.      MEDICAL HISTORY:  Past Medical History:  Diagnosis Date   Breast cancer (HCC)    Bronchitis 09/2016   Bruises easily    DDD (degenerative disc disease)    Degenerative joint disease of cervical spine 09/05/2011   Cervival area, now some osteoarthritis-lower back and Rt. shoulder   Depression    Diverticulitis 09/05/2011   hx. gastritis, diverticulitis x2 -now surgery planned   DIVERTICULITIS, ACUTE 02/10/2010   Dyspnea    with exertion   Essential hypertension 09/05/2011   tx. Verapamil   GROIN PAIN 09/21/2010   Headache(784.0)    headaches are better   Hearing loss    right ear   Heart palpitations    Hemorrhoids    History of bronchitis    History of colonic polyps    History of pneumonia    History of tension headache    Hyperlipidemia    Hypertension    Hypothyroidism 09/05/2011   Supplement used   Late effect of adverse effect of drug, medicinal or biological substance    NECK PAIN, ACUTE 09/21/2010   Neuromuscular disorder (HCC)    carpal tunnel in both hands   Nocturia    Osteoarthritis 112-17-12   spine and rt. hip, rt. shoulder   Patent foramen ovale    Small - unable to be closed -    Pneumonia    PONV (postoperative nausea and vomiting)    Sleep apnea 09/05/2011   no cpap ever, had surgery to remove cartilage,  no problems now   SORE THROAT 04/30/2009   Stroke (HCC) 09/05/2011   2006/2009-(loss of memory, balance issues remains occ.)   TIA 09/28/2007, 10/2014   Unstable angina (HCC)    a. 05/2010 Cath: nl cors, EF 55%;  b. 05/2015 Lexiscan MV: small, severe, fixed apical defect and a small, severe, reversible inf lateral defect w/ apical thinning and mild ischemia, EF 54%.   URI 09/02/2008   Vertigo     SURGICAL HISTORY: Past Surgical History:  Procedure Laterality Date   ABDOMINAL HYSTERECTOMY     ANTERIOR LAT LUMBAR FUSION Left 04/05/2023    Procedure: LEFT-SIDED LUMBAR 2- LUMBAR 3, LUMBAR 3- LUMBAR 4 LATERAL INTERBODY FUSION WITH INSTRUMENTATION AND ALLOGRAFT;  Surgeon: Estill Bamberg, MD;  Location: [facility] OR;  Service: Orthopedics;  Laterality: Left;   BACK SURGERY     BREAST BIOPSY Left 06/29/2023   MM LT BREAST BX W LOC DEV 1ST LESION IMAGE BX SPEC STEREO GUIDE 06/29/2023 [facility] MAMMOGRAPHY   CARDIAC CATHETERIZATION N/A 06/01/2015   Procedure: Left Heart Cath and Coronary Angiography;  Surgeon: Kathleene Hazel, MD;  Location: [facility] INVASIVE CV LAB;  Service: Cardiovascular;  Laterality: N/A;   COLON RESECTION  09/07/2011   Procedure: LAPAROSCOPIC SIGMOID COLON RESECTION;  Surgeon: Mariella Saa, MD;  Location: [facility] ORS;  Service: General;  Laterality: N/A;  with proctoscopy   COLONOSCOPY  08/06/2015   per Dr. Kinnie Scales, adenomatous polyps, repeat in 5 yrs    CYST EXCISION N/A 11/29/2016   Procedure: EXCISION UMBILICAL CYST;  Surgeon: Glenna Fellows, MD;  Location: [facility]S;  Service: General;  Laterality: N/A;   ELBOW SURGERY  09-05-11   left elbow -ligament repair   EYE SURGERY     cataract   INSERTION OF MESH N/A 12/13/2013   Procedure: INSERTION OF MESH;  Surgeon: Mariella Saa, MD;  Location: [facility];  Service: General;  Laterality: N/A;   LAPAROSCOPY  09/07/2011   Procedure: LAPAROSCOPY DIAGNOSTIC;  Surgeon: Roselle Locus II;  Location: [facility];  Service: Gynecology;  Laterality: N/A;   MAXIMUM ACCESS (MAS)POSTERIOR LUMBAR INTERBODY FUSION (PLIF) 2 LEVEL N/A 05/03/2016   Procedure: L4-5 L5-S1 Maximum access posterior lumbar interbody fusion;  Surgeon: Maeola Harman, MD;  Location: [facility] NEURO ORS;  Service: Neurosurgery;  Laterality: N/A;  L4-5 L5-S1 Maximum access posterior lumbar interbody fusion   SALPINGOOPHORECTOMY  09/07/2011   Procedure: SALPINGO OOPHERECTOMY;  Surgeon: Roselle Locus II;  Location: [facility];  Service: Gynecology;  Laterality: Right;   THYROIDECTOMY, PARTIAL  09-05-11   TOTAL HIP ARTHROPLASTY  Right 08/21/2018   Procedure: TOTAL HIP ARTHROPLASTY ANTERIOR APPROACH;  Surgeon: Marcene Corning, MD;  Location: [facility] OR;  Service: Orthopedics;  Laterality: Right;   TUBAL LIGATION  1983   UMBILICAL HERNIA REPAIR  yrs ago   VENTRAL HERNIA REPAIR  06/20/2012   Procedure: LAPAROSCOPIC VENTRAL HERNIA;  Surgeon: Mariella Saa, MD;  Location: [facility];  Service: General;  Laterality: N/A;  Laparoscopic Repair of Ventral Hernia with mesh   VENTRAL HERNIA REPAIR N/A 12/13/2013   Procedure: LAPAROSCOPIC REPAIR RECURRENT VENTRAL INCISIONAL  HERNIA;  Surgeon: Mariella Saa, MD;  Location: [facility];  Service: General;  Laterality: N/A;    SOCIAL HISTORY: Social History   Socioeconomic History   Marital status: Married    Spouse name: Not on file   Number of children: 3   Years of education: Not on file   Highest education level: Not on file  Occupational History  Not on file  Tobacco Use   Smoking status: Former    Current packs/day: 0.00    Average packs/day: 0.5 packs/day for 5.0 years (2.5 ttl pk-yrs)    Types: Cigarettes    Start date: 09/20/1963    Quit date: 09/19/1968    Years since quitting: 54.8   Smokeless tobacco: Never  Vaping Use   Vaping status: Never Used  Substance and Sexual Activity   Alcohol use: Yes    Alcohol/week: 2.0 standard drinks of alcohol    Types: 1 Cans of beer, 1 Shots of liquor per week    Comment: rarely   Drug use: No   Sexual activity: Yes  Other Topics Concern   Not on file  Social History Narrative   Lives in [place] with her husband.  She does not routinely exercise.   Social Determinants of Health   Financial Resource Strain: Not on file  Food Insecurity: Not on file  Transportation Needs: Not on file  Physical Activity: Not on file  Stress: Not on file  Social Connections: Not on file  Intimate Partner Violence: Not on file    FAMILY HISTORY: Family History  Problem Relation Age of Onset   Arrhythmia Mother    Hypertension  Mother    Stroke Mother    Stroke Maternal Grandmother    Diabetes Paternal Grandmother    Breast cancer Sister        29s   Breast cancer Sister        25s    ALLERGIES:  is allergic to dilaudid [hydromorphone hcl], shellfish allergy, aggrenox [aspirin-dipyridamole er], codeine, doxycycline monohydrate, cephalexin, codeine phosphate, hydrocodone, meperidine hcl, and percocet [oxycodone-acetaminophen].  MEDICATIONS:  Current Outpatient Medications  Medication Sig Dispense Refill   amLODipine (NORVASC) 10 MG tablet TAKE 1 TABLET BY MOUTH EVERY DAY 90 tablet 1   amoxicillin-clavulanate (AUGMENTIN) 875-125 MG tablet Take 1 tablet by mouth 2 (two) times daily. 20 tablet 0   atorvastatin (LIPITOR) 40 MG tablet TAKE 1 TABLET BY MOUTH EVERY DAY 30 tablet 0   butalbital-acetaminophen-caffeine (FIORICET) 50-325-40 MG tablet TAKE 1 TABLET BY MOUTH EVERY 6 HOURS AS NEEDED FOR HEADACHE 60 tablet 5   clopidogrel (PLAVIX) 75 MG tablet Take 75 mg by mouth daily.     ketoconazole (NIZORAL) 2 % cream Apply 1 Application topically 2 (two) times daily. 30 g 2   levothyroxine (SYNTHROID) 125 MCG tablet TAKE 1 TABLET BY MOUTH EVERY DAY BEFORE BREAKFAST 90 tablet 1   losartan (COZAAR) 100 MG tablet TAKE 1 TABLET BY MOUTH EVERY DAY 90 tablet 3   methocarbamol (ROBAXIN) 500 MG tablet Take 1 tablet (500 mg total) by mouth every 6 (six) hours as needed for muscle spasms. 30 tablet 2   metoprolol succinate (TOPROL-XL) 25 MG 24 hr tablet TAKE 1 TABLET BY MOUTH EVERY DAY. (NEED APPT FOR REFILLS) 90 tablet 1   ondansetron (ZOFRAN) 8 MG tablet Take 1 tablet (8 mg total) by mouth every 8 (eight) hours as needed for nausea or vomiting. 60 tablet 0   oxyCODONE-acetaminophen (PERCOCET/ROXICET) 5-325 MG tablet Take 1-2 tablets by mouth every 4 (four) hours as needed for severe pain. 30 tablet 0   potassium chloride (KLOR-CON 10) 10 MEQ tablet Take 1 tablet (10 mEq total) by mouth 2 (two) times daily. Take 2 tab twice daily  for 5 days 20 tablet 0   potassium chloride (KLOR-CON M) 10 MEQ tablet Take 2 tablets twice a day 360 tablet 3   traMADol (  ULTRAM) 50 MG tablet TAKE 1 TO 2 TABLETS BY MOUTH EVERY 4 TO 6 HOURS AS NEEDED FOR PAIN 120 tablet 5   No current facility-administered medications for this visit.    REVIEW OF SYSTEMS:   Constitutional: Denies fevers, chills or abnormal night sweats Eyes: Denies blurriness of vision, double vision or watery eyes Ears, nose, mouth, throat, and face: Denies mucositis or sore throat Respiratory: Denies cough, dyspnea or wheezes Cardiovascular: Denies palpitation, chest discomfort or lower extremity swelling Gastrointestinal:  Denies nausea, heartburn or change in bowel habits Skin: Denies abnormal skin rashes Lymphatics: Denies new lymphadenopathy or easy bruising Neurological:Denies numbness, tingling or new weaknesses Behavioral/Psych: Mood is stable, no new changes  All other systems were reviewed with the patient and are negative.  PHYSICAL EXAMINATION: ECOG PERFORMANCE STATUS: 1  Vitals:   07/05/23 1305  BP: (!) 145/66  Pulse: 64  Resp: 18  Temp: (!) 97.3 F (36.3 C)  SpO2: 99%   Filed Weights   07/05/23 1305  Weight: 198 lb 9.6 oz (90.1 kg)    GENERAL:alert, no distress and comfortable SKIN: skin color, texture, turgor are normal, no rashes or significant lesions EYES: normal, conjunctiva are pink and non-injected, sclera clear OROPHARYNX:no exudate, no erythema and lips, buccal mucosa, and tongue normal  NECK: supple, thyroid normal size, non-tender, without nodularity LYMPH:  no palpable lymphadenopathy in the cervical, axillary or inguinal LUNGS: clear to auscultation and percussion with normal breathing effort HEART: regular rate & rhythm and no murmurs and no lower extremity edema ABDOMEN:abdomen soft, non-tender and normal bowel sounds Musculoskeletal:no cyanosis of digits and no clubbing  PSYCH: alert & oriented x 3 with fluent  speech NEURO: no focal motor/sensory deficits Breasts: Breast inspection showed them to be symmetrical with no nipple discharge. Palpation of the breasts and axilla revealed no obvious mass that I could appreciate.   LABORATORY DATA:  I have reviewed the data as listed    Latest Ref Rng & Units 07/05/2023   12:38 PM 04/26/2023    2:44 PM 04/06/2023    8:28 AM  CBC  WBC 4.0 - 10.5 K/uL 5.0  6.9    Hemoglobin 12.0 - 15.0 g/dL 60.4  54.0  98.1   Hematocrit 36.0 - 46.0 % 36.5  37.6  32.0   Platelets 150 - 400 K/uL 171  232.0         Latest Ref Rng & Units 07/05/2023   12:38 PM 05/09/2023   10:27 AM 04/26/2023    2:44 PM  CMP  Glucose 70 - 99 mg/dL 191  478  295   BUN 8 - 23 mg/dL 8  10  10    Creatinine 0.44 - 1.00 mg/dL 6.21  3.08  6.57   Sodium 135 - 145 mmol/L 142  140  139   Potassium 3.5 - 5.1 mmol/L 3.3  3.1  2.8   Chloride 98 - 111 mmol/L 109  105  104   CO2 22 - 32 mmol/L 25  24  24    Calcium 8.9 - 10.3 mg/dL 9.6  9.8  9.6   Total Protein 6.5 - 8.1 g/dL 7.6   7.9   Total Bilirubin 0.3 - 1.2 mg/dL 0.4   0.5   Alkaline Phos 38 - 126 U/L 123   110   AST 15 - 41 U/L 9   16   ALT 0 - 44 U/L 7   12      RADIOGRAPHIC STUDIES: I have personally reviewed the radiological images  as listed and agreed with the findings in the report. MM LT BREAST BX W LOC DEV 1ST LESION IMAGE BX SPEC STEREO GUIDE  Addendum Date: 07/03/2023   ADDENDUM REPORT: 07/03/2023 16:03 ADDENDUM: Pathology revealed DUCTAL CARCINOMA IN SITU, INTERMEDIATE NUCLEAR GRADE, CRIBRIFORM TYPE WITH FOCAL NECROSIS, DCIS FOCALLY INVOLVES A SMALL INTRADUCTAL PAPILLOMA, MICROCALCIFICATIONS PRESENT WITHIN DCIS AND PERITUMORAL STROMA of the LEFT breast, outer, (coil clip). This was found to be concordant by Dr. Emmaline Kluver. Pathology results were discussed with the patient by telephone. The patient reported doing well after the biopsy with tenderness and bruising at the site. The patient reported additional bleeding  throughout the night. She removed the compression bandage applied at [facility] post-biopsy, per patient. Post biopsy instructions and care were reviewed and questions were answered. The patient was encouraged to call [facility] for any additional concerns. My direct phone number was provided and the patient was instructed to call or text me before the end of the day, June 30, 2023, if she was still bleeding. The patient was referred to [facility] on July 05, 2023. Pathology results reported by Rene Kocher, RN on 06/30/2023. Electronically Signed   By: Emmaline Kluver M.D.   On: 07/03/2023 16:03   Addendum Date: 06/29/2023   ADDENDUM REPORT: 06/29/2023 12:00 ADDENDUM: Patient has a moderate size left breast hematoma following biopsy. A compression bandage was applied and bleeding had completely stopped prior to the patient leaving the office. Patient was given home care instructions. Electronically Signed   By: Emmaline Kluver M.D.   On: 06/29/2023 12:00   Result Date: 07/03/2023 CLINICAL DATA:  77 year old female presenting for biopsy of calcifications in the left breast. EXAM: LEFT BREAST STEREOTACTIC CORE NEEDLE BIOPSY COMPARISON:  Previous exam(s). FINDINGS: The patient and I discussed the procedure of stereotactic-guided biopsy including benefits and alternatives. We discussed the high likelihood of a successful procedure. We discussed the risks of the procedure including infection, bleeding, tissue injury, clip migration, and inadequate sampling. Informed written consent was given. The usual time out protocol was performed immediately prior to the procedure. Using sterile technique and 1% Lidocaine as local anesthetic, under stereotactic guidance, a 9 gauge vacuum assisted device was used to perform core needle biopsy of calcifications in the outer left breast using a lateral  approach. Specimen radiograph was performed showing several specimens with calcifications. Specimens with calcifications are identified for pathology. Lesion quadrant: Upper outer quadrant At the conclusion of the procedure, a coil shaped tissue marker clip was deployed into the biopsy cavity. Follow-up 2-view mammogram was performed and dictated separately. IMPRESSION: Stereotactic-guided biopsy of calcifications in the outer left breast. No apparent complications. Electronically Signed: By: Emmaline Kluver M.D. On: 06/29/2023 11:33   MM CLIP PLACEMENT LEFT  Result Date: 06/29/2023 CLINICAL DATA:  Post procedure mammogram for clip placement EXAM: 3D DIAGNOSTIC LEFT MAMMOGRAM POST STEREOTACTIC BIOPSY COMPARISON:  Previous exam(s). FINDINGS: 3D Mammographic images were obtained following stereotactic guided biopsy of calcifications in the outer left breast. The coil biopsy marking clip is in expected position at the site of biopsy. IMPRESSION: Appropriate positioning of the coil shaped biopsy marking clip at the site of biopsy in the outer left breast. Final Assessment: Post Procedure Mammograms for Marker Placement BI-RADS CATEGORY  63M: Post-Procedure Mammogram for Marker Placement Electronically Signed   By: Emmaline Kluver M.D.   On: 06/29/2023 11:28   MM Digital Diagnostic Unilat  L  Result Date: 06/23/2023 CLINICAL DATA:  Screening recall for left breast calcifications. Patient has a strong family history of breast cancer, including a sister having been diagnosed with breast cancer. EXAM: DIGITAL DIAGNOSTIC UNILATERAL LEFT MAMMOGRAM WITH CAD TECHNIQUE: Left digital diagnostic mammography was performed. COMPARISON:  Previous exam(s). ACR Breast Density Category b: There are scattered areas of fibroglandular density. FINDINGS: Spot compression magnification views were performed of the outer left breast demonstrating a 1.7 cm somewhat linear oriented group of pleomorphic calcifications. IMPRESSION:  Suspicious 1.7 cm group of calcifications in the outer left breast. RECOMMENDATION: Recommend stereotactic guided biopsy of the calcifications in the outer left breast. I have discussed the findings and recommendations with the patient. If applicable, a reminder letter will be sent to the patient regarding the next appointment. BI-RADS CATEGORY  4: Suspicious. Electronically Signed   By: Edwin Cap M.D.   On: 06/23/2023 14:37   MM 3D SCREENING MAMMOGRAM BILATERAL BREAST  Result Date: 06/15/2023 CLINICAL DATA:  Screening. EXAM: DIGITAL SCREENING BILATERAL MAMMOGRAM WITH TOMOSYNTHESIS AND CAD TECHNIQUE: Bilateral screening digital craniocaudal and mediolateral oblique mammograms were obtained. Bilateral screening digital breast tomosynthesis was performed. The images were evaluated with computer-aided detection. COMPARISON:  Previous exam(s). ACR Breast Density Category b: There are scattered areas of fibroglandular density. FINDINGS: In the left breast, calcifications warrant further evaluation. In the right breast, no findings suspicious for malignancy. IMPRESSION: Further evaluation is suggested for calcifications in the left breast. RECOMMENDATION: Diagnostic mammogram of the left breast. (Code:FI-L-22M) The patient will be contacted regarding the findings, and additional imaging will be scheduled. BI-RADS CATEGORY  0: Incomplete: Need additional imaging evaluation. Electronically Signed   By: Elberta Fortis M.D.   On: 06/15/2023 11:31    ASSESSMENT & PLAN:   77 year old female, presented with screening discovered left breast DCIS  1. Left breast DCIS, grade 2, ER+ /PR+ -I discussed her breast imaging and needle biopsy results with patient and her family members in great detail. -She is a candidate for breast conservation surgery. She has been seen by breast surgeon Dr. Donell Beers who recommends lumpectomy. -Given her strong family history of breast cancer, we recommend her to undergo genetic testing  to ruled out inheritable breast cancer -Her DCIS will be cured by complete surgical resection. Any form of adjuvant therapy is preventive. -Given her strongly positive/negative ER and PR, I discussed the benefit and potential side effect of  antiestrogen therapy with tamoxifen, which decrease her risk of future breast cancer by ~40%.  I recommended tamoxifen 5 mg daily for 3 years. -She will likely benefit from breast radiation if she undergo lumpectomy to decrease the risk of breast cancer. -Due to her advanced age, and overall estimated 15% risk of future breast cancer in the next 10 years, we do not feel she needs both antiestrogen therapy or radiation, she is leaning towards radiation. -We also discussed that biopsy may have sampling limitation, we will review her surgical path, to see if she has any invasive carcinoma components. -We discussed breast cancer surveillance after she completes treatment, Including annual mammogram, breast exam every 6-12 months.  Plan -She will likely have lumpectomy soon -Genetic refer -She will likely have adjuvant radiation after surgery, and forgo antiestrogen therapy.  I will see her back as needed.  No orders of the defined types were placed in this encounter.   All questions were answered. The patient knows to call the clinic with any problems, questions or concerns. I spent 35 minutes counseling the  patient face to face. The total time spent in the appointment was 45 minutes and more than 50% was on counseling.     Malachy Mood, MD 07/05/2023 2:56 PM

## 2023-07-06 ENCOUNTER — Telehealth: Payer: Self-pay

## 2023-07-06 NOTE — Progress Notes (Signed)
REFERRING PROVIDER: Malachy Mood, MD  PRIMARY PROVIDER:  Nelwyn Salisbury, MD  PRIMARY REASON FOR VISIT:  1. Ductal carcinoma in situ (DCIS) of left breast   2. Family history of breast cancer   3. Family history of prostate cancer    HISTORY OF PRESENT ILLNESS:   Abigail [name], a 77 y.o. female, was seen for a [facility] cancer genetics consultation at the request of Dr. Mosetta Putt due to a personal and family history of cancer.  Abigail [name] presents to clinic today to discuss the possibility of a hereditary predisposition to cancer, to discuss genetic testing, and to further clarify her future cancer risks, as well as potential cancer risks for family members.   In October 2024, at the age of 67, Abigail [name] was diagnosed with ductal carcinoma in situ of the left breast (ER/PR positive).    Past Medical History:  Diagnosis Date   Breast cancer (HCC)    Bronchitis 09/2016   Bruises easily    DDD (degenerative disc disease)    Degenerative joint disease of cervical spine 09/05/2011   Cervival area, now some osteoarthritis-lower back and Rt. shoulder   Depression    Diverticulitis 09/05/2011   hx. gastritis, diverticulitis x2 -now surgery planned   DIVERTICULITIS, ACUTE 02/10/2010   Dyspnea    with exertion   Essential hypertension 09/05/2011   tx. Verapamil   GROIN PAIN 09/21/2010   Headache(784.0)    headaches are better   Hearing loss    right ear   Heart palpitations    Hemorrhoids    History of bronchitis    History of colonic polyps    History of pneumonia    History of tension headache    Hyperlipidemia    Hypertension    Hypothyroidism 09/05/2011   Supplement used   Late effect of adverse effect of drug, medicinal or biological substance    NECK PAIN, ACUTE 09/21/2010   Neuromuscular disorder (HCC)    carpal tunnel in both hands   Nocturia    Osteoarthritis 112-17-12   spine and rt. hip, rt. shoulder   Patent foramen ovale    Small - unable to be closed -    Pneumonia     PONV (postoperative nausea and vomiting)    Sleep apnea 09/05/2011   no cpap ever, had surgery to remove cartilage, no problems now   SORE THROAT 04/30/2009   Stroke (HCC) 09/05/2011   2006/2009-(loss of memory, balance issues remains occ.)   TIA 09/28/2007, 10/2014   Unstable angina (HCC)    a. 05/2010 Cath: nl cors, EF 55%;  b. 05/2015 Lexiscan MV: small, severe, fixed apical defect and a small, severe, reversible inf lateral defect w/ apical thinning and mild ischemia, EF 54%.   URI 09/02/2008   Vertigo     Past Surgical History:  Procedure Laterality Date   ABDOMINAL HYSTERECTOMY     ANTERIOR LAT LUMBAR FUSION Left 04/05/2023   Procedure: LEFT-SIDED LUMBAR 2- LUMBAR 3, LUMBAR 3- LUMBAR 4 LATERAL INTERBODY FUSION WITH INSTRUMENTATION AND ALLOGRAFT;  Surgeon: Estill Bamberg, MD;  Location: [facility];  Service: Orthopedics;  Laterality: Left;   BACK SURGERY     BREAST BIOPSY Left 06/29/2023   MM LT BREAST BX W LOC DEV 1ST LESION IMAGE BX SPEC STEREO GUIDE 06/29/2023 [facility]   CARDIAC CATHETERIZATION N/A 06/01/2015   Procedure: Left Heart Cath and Coronary Angiography;  Surgeon: Kathleene Hazel, MD;  Location: [facility]VASIVE CV LAB;  Service: Cardiovascular;  Laterality:  N/A;   COLON RESECTION  09/07/2011   Procedure: LAPAROSCOPIC SIGMOID COLON RESECTION;  Surgeon: Mariella Saa, MD;  Location: [facility] ORS;  Service: General;  Laterality: N/A;  with proctoscopy   COLONOSCOPY  08/06/2015   per Dr. Kinnie Scales, adenomatous polyps, repeat in 5 yrs    CYST EXCISION N/A 11/29/2016   Procedure: EXCISION UMBILICAL CYST;  Surgeon: Glenna Fellows, MD;  Location: [facility];  Service: General;  Laterality: N/A;   ELBOW SURGERY  09-05-11   left elbow -ligament repair   EYE SURGERY     cataract   INSERTION OF MESH N/A 12/13/2013   Procedure: INSERTION OF MESH;  Surgeon: Mariella Saa, MD;  Location: [facility];  Service: General;  Laterality: N/A;   LAPAROSCOPY  09/07/2011   Procedure:  LAPAROSCOPY DIAGNOSTIC;  Surgeon: Roselle Locus II;  Location: [facility];  Service: Gynecology;  Laterality: N/A;   MAXIMUM ACCESS (MAS)POSTERIOR LUMBAR INTERBODY FUSION (PLIF) 2 LEVEL N/A 05/03/2016   Procedure: L4-5 L5-S1 Maximum access posterior lumbar interbody fusion;  Surgeon: Maeola Harman, MD;  Location: [facility] NEURO ORS;  Service: Neurosurgery;  Laterality: N/A;  L4-5 L5-S1 Maximum access posterior lumbar interbody fusion   SALPINGOOPHORECTOMY  09/07/2011   Procedure: SALPINGO OOPHERECTOMY;  Surgeon: Roselle Locus II;  Location: [facility];  Service: Gynecology;  Laterality: Right;   THYROIDECTOMY, PARTIAL  09-05-11   TOTAL HIP ARTHROPLASTY Right 08/21/2018   Procedure: TOTAL HIP ARTHROPLASTY ANTERIOR APPROACH;  Surgeon: Marcene Corning, MD;  Location: [facility];  Service: Orthopedics;  Laterality: Right;   TUBAL LIGATION  1983   UMBILICAL HERNIA REPAIR  yrs ago   VENTRAL HERNIA REPAIR  06/20/2012   Procedure: LAPAROSCOPIC VENTRAL HERNIA;  Surgeon: Mariella Saa, MD;  Location: [facility];  Service: General;  Laterality: N/A;  Laparoscopic Repair of Ventral Hernia with mesh   VENTRAL HERNIA REPAIR N/A 12/13/2013   Procedure: LAPAROSCOPIC REPAIR RECURRENT VENTRAL INCISIONAL  HERNIA;  Surgeon: Mariella Saa, MD;  Location: [facility];  Service: General;  Laterality: N/A;    Social History   Socioeconomic History   Marital status: Married    Spouse name: Not on file   Number of children: 3   Years of education: Not on file   Highest education level: Not on file  Occupational History   Not on file  Tobacco Use   Smoking status: Former    Current packs/day: 0.00    Average packs/day: 0.5 packs/day for 5.0 years (2.5 ttl pk-yrs)    Types: Cigarettes    Start date: 09/20/1963    Quit date: 09/19/1968    Years since quitting: 54.8   Smokeless tobacco: Never  Vaping Use   Vaping status: Never Used  Substance and Sexual Activity   Alcohol use: Yes    Alcohol/week: 2.0 standard drinks of alcohol     Types: 1 Cans of beer, 1 Shots of liquor per week    Comment: rarely   Drug use: No   Sexual activity: Yes  Other Topics Concern   Not on file  Social History Narrative   Lives in [place] with her husband.  She does not routinely exercise.   Social Determinants of Health   Financial Resource Strain: Not on file  Food Insecurity: No Food Insecurity (07/05/2023)   Hunger Vital Sign    Worried About Running Out of Food in the Last Year: Never true    Ran Out of Food in the Last Year: Never true  Transportation Needs: No  Transportation Needs (07/05/2023)   PRAPARE - Administrator, Civil Service (Medical): No    Lack of Transportation (Non-Medical): No  Physical Activity: Not on file  Stress: Not on file  Social Connections: Not on file     FAMILY HISTORY:  We obtained a detailed, 4-generation family history.  Significant diagnoses are listed below: Family History  Problem Relation Age of Onset   Arrhythmia Mother    Hypertension Mother    Stroke Mother    Prostate cancer Father 2 - 1       metastatic   Breast cancer Sister        19s   Breast cancer Sister        89s   Stroke Maternal Grandmother    Diabetes Paternal Grandmother    Brain cancer Cousin        dx. >50      Abigail [name] is unaware of previous family history of genetic testing for hereditary cancer risks. There is no reported Ashkenazi Jewish ancestry.  GENETIC COUNSELING ASSESSMENT: Abigail [name] is a 77 y.o. female with a personal and family history of cancer which is somewhat suggestive of a hereditary predisposition to cancer. We, therefore, discussed and recommended the following at today's visit.   DISCUSSION: We discussed that 5 - 10% of cancer is hereditary, with most cases of breast cancer associated with BRCA1/2.  There are other genes that can be associated with hereditary breast cancer syndromes.  We discussed that testing is beneficial for several reasons including knowing how to follow  individuals after completing their treatment, identifying whether potential treatment options would be beneficial, and understanding if other family members could be at risk for cancer and allowing them to undergo genetic testing.   We reviewed the characteristics, features and inheritance patterns of hereditary cancer syndromes. We also discussed genetic testing, including the appropriate family members to test, the process of testing, insurance coverage and turn-around-time for results. We discussed the implications of a negative, positive, carrier and/or variant of uncertain significant result. We recommended Abigail [name] pursue genetic testing for a panel that includes genes associated with breast and prostate cancer.   Abigail [name]  was offered a common hereditary cancer panel (48 genes) and an expanded pan-cancer panel (70 genes). Abigail [name] was informed of the benefits and limitations of each panel, including that expanded pan-cancer panels contain genes that do not have clear management guidelines at this point in time.  We also discussed that as the number of genes included on a panel increases, the chances of variants of uncertain significance increases. After considering the benefits and limitations of each gene panel, Abigail [name] elected to have [facility] CancerNext Panel.  The CancerNext gene panel offered by [employer] includes sequencing, rearrangement analysis, and RNA analysis for the following 34 genes:   APC, ATM, AXIN2, BARD1, BMPR1A, BRCA1, BRCA2, BRIP1, CDH1, CDK4, CDKN2A, CHEK2, DICER1, HOXB13, EPCAM, GREM1, MLH1, MSH2, MSH3, MSH6, MUTYH, NF1, NTHL1, PALB2, PMS2, POLD1, POLE, PTEN, RAD51C, RAD51D, SMAD4, SMARCA4, STK11, and TP53.   Based on Abigail [name] personal and family history of cancer, she meets medical criteria for genetic testing. Despite that she meets criteria, she may still have an out of pocket cost. We discussed that if her out of pocket cost for testing is over $100, the laboratory  should contact them to discuss self-pay prices, patient pay assistance programs, if applicable, and other billing options.  PLAN: After considering the risks, benefits, and limitations, Abigail [name]  provided informed consent to pursue genetic testing and the blood sample was sent to [facility] for analysis of the CancerNext Panel. Results should be available within approximately 2-3 weeks' time, at which point they will be disclosed by telephone to Abigail [name], as will any additional recommendations warranted by these results. Abigail [name] will receive a summary of her genetic counseling visit and a copy of her results once available. This information will also be available in Epic.   Abigail [name] questions were answered to her satisfaction today. Our contact information was provided should additional questions or concerns arise. Thank you for the referral and allowing [place] to share in the care of your patient.   Lalla Brothers, MS, [facility] Genetic Counselor [email or web address] (P) [phone]  The patient was seen for a total of 20 minutes in face-to-face genetic counseling.  The patient brought her husband.  Drs. Pamelia Hoit and/or Mosetta Putt were available to discuss this case as needed.   _______________________________________________________________________ For Office Staff:  Number of people involved in session: 2 Was an Intern/ student involved with case: no

## 2023-07-06 NOTE — Telephone Encounter (Signed)
..  Pre-operative Risk Assessment    Patient Name: Abigail [name]  DOB: 1945-09-25 MRN: [id number]      Request for Surgical Clearance    Procedure:   LEFT BREAST LUMPECTOMY  Date of Surgery:  Clearance TBD                                 Surgeon:  DR Almond Lint Surgeon's Group or Practice Name:  [facility] Phone number:  [phone] Fax number:  [phone]   Type of Clearance Requested:   - Medical  - Pharmacy:  Hold Clopidogrel (Plavix)     Type of Anesthesia:  General    Additional requests/questions:   LAST O/V 06/27/23, NO NEW APPT  Signed, Renee Ramus   07/06/2023, 11:56 AM

## 2023-07-07 ENCOUNTER — Encounter: Payer: Self-pay | Admitting: Genetic Counselor

## 2023-07-07 NOTE — Telephone Encounter (Signed)
Name: Abigail [name]  DOB: 05-29-1946  MRN: [id number]   Primary Cardiologist: [name], MD  Chart reviewed as part of pre-operative protocol coverage. Patient was contacted 07/07/2023 in reference to pre-operative risk assessment for pending surgery as outlined below.  Abigail [name] was last seen on 06/27/2023 by Azalee Course PA-C.  She had a coronary CT obtained on 09/28/2022 that showed minimal CAD.  Patient continued to have dyspnea on exertion and was seen recently by myself.  Echocardiogram was ordered, however suspicion for cardiac cause is fairly low.  Therefore, based on ACC/AHA guidelines, the patient would be at acceptable risk for the planned procedure without further cardiovascular testing.   Patient is not on Plavix due to cardiac reason.  Will defer to PCP to decide on the holding time of Plavix.  I will route this recommendation to the requesting party via Epic fax function and remove from pre-op pool. Please call with questions.  [place], [place] 07/07/2023, 1:42 PM

## 2023-07-10 ENCOUNTER — Telehealth: Payer: Self-pay | Admitting: *Deleted

## 2023-07-10 NOTE — Telephone Encounter (Signed)
Left vm regarding bmdc from 07/05/23. Contact information provided for questions or needs.

## 2023-07-10 NOTE — Telephone Encounter (Signed)
Spoke to pt concerning BMDC from 07/05/23. Denies questions or concerns regarding dx or treatment care plan. Encourage pt to call with needs. Received verbal understanding.

## 2023-07-11 ENCOUNTER — Encounter: Payer: Self-pay | Admitting: *Deleted

## 2023-07-14 ENCOUNTER — Telehealth: Payer: Self-pay | Admitting: Neurology

## 2023-07-14 ENCOUNTER — Encounter: Payer: Self-pay | Admitting: *Deleted

## 2023-07-14 ENCOUNTER — Telehealth: Payer: Self-pay | Admitting: *Deleted

## 2023-07-14 NOTE — Telephone Encounter (Signed)
Pt called regarding surgery date. Informed pt Dr. Arita Miss office is waiting on cardiology clearance (which has been received) as well as holding instructions for her prescribed plavix. Per pt, Dr. Pearlean Brownie at [facility] is the prescribing physician. Informed pt Dr. Arita Miss nurse will be made aware.  Msg sent to Dr. Alvira Monday and her nurse regarding prescribing provider  for plavix.  No further questions at this time. Encourage pt to call with needs. Received verbal understanding.

## 2023-07-14 NOTE — Telephone Encounter (Signed)
[facility](Dr Hartford)  has called to f/u on Urgent Neuro Clearance Request submitted on 10-17 and sent again today, this is urgent re: breast cancer. Please give Abigail [name] a call on this at [phone]. She agreed to leave a vm for Medical Records but also asked this phone note be done as well.

## 2023-07-17 ENCOUNTER — Encounter: Payer: Self-pay | Admitting: Neurology

## 2023-07-17 NOTE — Telephone Encounter (Signed)
Called their office to advise we had not received a neuro clearance form. Advised a clearance form could be faxed to me directly. I was transferred to someone who advised they will be sending the form over to my fax to be completed.

## 2023-07-17 NOTE — Telephone Encounter (Signed)
Received the form and it was a requesting a letter to provide neurology clearance. Abigail [name] had old strokes found on 2016 MRI of brain. There has been no evidence of new stroke findings and last MRI/MRA completed was feb 2023.  Abigail [name] remains on plavix for anticoagulant therapy which appears to be ordered by PCP. Placed letter in Dr Marlis Edelson box to sign

## 2023-07-17 NOTE — Telephone Encounter (Signed)
We had not received a neuro clearance form for this practice. We will reach out to [facility] to have them resend the form. They can fax to [phone] and should come directly to me

## 2023-07-18 NOTE — Telephone Encounter (Signed)
Letter has been signed and and faxed to the CMA at number requested. Received confirmation that it went through

## 2023-07-19 ENCOUNTER — Encounter: Payer: Self-pay | Admitting: General Practice

## 2023-07-19 NOTE — Progress Notes (Signed)
[facility] Spiritual Care Note  Followed up with Abigail Abigail [name] by phone. She reports that she is overall doing well emotionally and spiritually. She is eager for surgery to be scheduled and completed. Attending Mass on weekdays is very meaningful to her.  Abigail [name] plans to follow up by phone whenever needed/desired.   [address] [place], [place], [facility] Pager [phone] Voicemail [phone]

## 2023-07-20 ENCOUNTER — Ambulatory Visit (HOSPITAL_COMMUNITY): Payer: Medicare PPO | Attending: Physician Assistant

## 2023-07-20 DIAGNOSIS — R0609 Other forms of dyspnea: Secondary | ICD-10-CM

## 2023-07-20 LAB — ECHOCARDIOGRAM COMPLETE
Area-P 1/2: 3.5 cm2
S' Lateral: 2.6 cm

## 2023-07-21 ENCOUNTER — Other Ambulatory Visit: Payer: Self-pay | Admitting: Family Medicine

## 2023-07-21 ENCOUNTER — Other Ambulatory Visit: Payer: Self-pay | Admitting: General Surgery

## 2023-07-21 DIAGNOSIS — Z170 Estrogen receptor positive status [ER+]: Secondary | ICD-10-CM

## 2023-07-24 ENCOUNTER — Encounter: Payer: Self-pay | Admitting: *Deleted

## 2023-07-24 ENCOUNTER — Other Ambulatory Visit: Payer: Self-pay

## 2023-07-24 ENCOUNTER — Encounter (HOSPITAL_BASED_OUTPATIENT_CLINIC_OR_DEPARTMENT_OTHER): Payer: Self-pay | Admitting: General Surgery

## 2023-07-24 DIAGNOSIS — D0512 Intraductal carcinoma in situ of left breast: Secondary | ICD-10-CM

## 2023-07-26 MED ORDER — CHLORHEXIDINE GLUCONATE CLOTH 2 % EX PADS: 6.0000 | MEDICATED_PAD | Freq: Once | CUTANEOUS | Status: DC

## 2023-07-27 ENCOUNTER — Encounter: Payer: Self-pay | Admitting: Genetic Counselor

## 2023-07-27 ENCOUNTER — Telehealth: Payer: Self-pay | Admitting: Genetic Counselor

## 2023-07-27 DIAGNOSIS — Z1379 Encounter for other screening for genetic and chromosomal anomalies: Secondary | ICD-10-CM | POA: Insufficient documentation

## 2023-07-27 NOTE — Telephone Encounter (Addendum)
I contacted [name] to discuss her genetic testing results. No pathogenic variants were identified in the 34 genes analyzed. Detailed clinic note to follow.  The test report has been scanned into EPIC and is located under the Molecular Pathology section of the Results Review tab.  A portion of the result report is included below for reference.   Lalla Brothers, MS, [facility] Genetic Counselor [email or web address] (P) [phone]

## 2023-07-31 ENCOUNTER — Encounter: Payer: Self-pay | Admitting: Genetic Counselor

## 2023-07-31 ENCOUNTER — Ambulatory Visit: Payer: Self-pay | Admitting: Genetic Counselor

## 2023-07-31 DIAGNOSIS — Z1379 Encounter for other screening for genetic and chromosomal anomalies: Secondary | ICD-10-CM

## 2023-07-31 NOTE — Progress Notes (Signed)
HPI:   Abigail [name] was previously seen in the [facility] due to a personal and family history of cancer and concerns regarding a hereditary predisposition to cancer. Please refer to our prior cancer genetics clinic note for more information regarding our discussion, assessment and recommendations, at the time. Abigail [name] recent genetic test results were disclosed to her, as were recommendations warranted by these results. These results and recommendations are discussed in more detail below.  CANCER HISTORY:  Oncology History  Ductal carcinoma in situ (DCIS) of left breast  07/03/2023 Initial Diagnosis   Ductal carcinoma in situ (DCIS) of left breast    Genetic Testing   [facility] CancerNext+RNA Panel was Negative. Report date is 07/25/2023.   The CancerNext gene panel offered by [employer] includes sequencing, rearrangement analysis, and RNA analysis for the following 34 genes:  APC, ATM, AXIN2, BARD1, BMPR1A, BRCA1, BRCA2, BRIP1, CDH1, CDK4, CDKN2A, CHEK2, DICER1, HOXB13, EPCAM, GREM1, MLH1, MSH2, MSH3, MSH6, MUTYH, NF1, NTHL1, PALB2, PMS2, POLD1, POLE, PTEN, RAD51C, RAD51D, SMAD4, SMARCA4, STK11, and TP53.      FAMILY HISTORY:  We obtained a detailed, 4-generation family history.  Significant diagnoses are listed below:      Family History  Problem Relation Age of Onset   Arrhythmia Mother     Hypertension Mother     Stroke Mother     Prostate cancer Father 60 - 84        metastatic   Breast cancer Sister          13s   Breast cancer Sister          67s   Stroke Maternal Grandmother     Diabetes Paternal Grandmother     Brain cancer Cousin          dx. >50             Abigail [name] is unaware of previous family history of genetic testing for hereditary cancer risks. There is no reported Ashkenazi Jewish ancestry.  GENETIC TEST RESULTS:  The [facility] CancerNext Panel found no pathogenic mutations.   The CancerNext gene panel offered by [employer] includes  sequencing, rearrangement analysis, and RNA analysis for the following 34 genes:   APC, ATM, AXIN2, BARD1, BMPR1A, BRCA1, BRCA2, BRIP1, CDH1, CDK4, CDKN2A, CHEK2, DICER1, HOXB13, EPCAM, GREM1, MLH1, MSH2, MSH3, MSH6, MUTYH, NF1, NTHL1, PALB2, PMS2, POLD1, POLE, PTEN, RAD51C, RAD51D, SMAD4, SMARCA4, STK11, and TP53.    The test report has been scanned into EPIC and is located under the Molecular Pathology section of the Results Review tab.  A portion of the result report is included below for reference. Genetic testing reported out on 07/25/2023.       Even though a pathogenic variant was not identified, possible explanations for the cancer in the family may include: There may be no hereditary risk for cancer in the family. The cancers in Abigail [name] and/or her family may be due to other genetic or environmental factors. There may be a gene mutation in one of these genes that current testing methods cannot detect, but that chance is small. There could be another gene that has not yet been discovered, or that we have not yet tested, that is responsible for the cancer diagnoses in the family.  It is also possible there is a hereditary cause for the cancer in the family that Abigail [name] did not inherit.  Therefore, it is important to remain in touch with cancer genetics in the future so  that we can continue to offer Abigail [name] the most up to date genetic testing.   ADDITIONAL GENETIC TESTING:  We discussed with Abigail [name] that her genetic testing was fairly extensive.  If there are genes identified to increase cancer risk that can be analyzed in the future, we would be happy to discuss and coordinate this testing at that time.    CANCER SCREENING RECOMMENDATIONS:  Abigail [name] test result is considered negative (normal).  This means that we have not identified a hereditary cause for her personal and family history of cancer at this time.    An individual's cancer risk and medical management are not determined  by genetic test results alone. Overall cancer risk assessment incorporates additional factors, including personal medical history, family history, and any available genetic information that may result in a personalized plan for cancer prevention and surveillance. Therefore, it is recommended she continue to follow the cancer management and screening guidelines provided by her oncology and primary healthcare provider.  RECOMMENDATIONS FOR FAMILY MEMBERS:   Since she did not inherit a mutation in a cancer predisposition gene included on this panel, her children could not have inherited a mutation from her in one of these genes. Individuals in this family might be at some increased risk of developing cancer, over the general population risk, due to the family history of cancer. We recommend women in this family have a yearly mammogram beginning at age 62, or 61 years younger than the earliest onset of cancer, an annual clinical breast exam, and perform monthly breast self-exams.  Other members of the family may still carry a pathogenic variant in one of these genes that Abigail [name] did not inherit. Based on the family history, we recommend her sisters have genetic counseling and testing.   FOLLOW-UP:  Cancer genetics is a rapidly advancing field and it is possible that new genetic tests will be appropriate for her and/or her family members in the future. We encouraged her to remain in contact with cancer genetics on an annual basis so we can update her personal and family histories and let her know of advances in cancer genetics that may benefit this family.   Our contact number was provided. Abigail [name] questions were answered to her satisfaction, and she knows she is welcome to call us at anytime with additional questions or concerns.   Lalla Brothers, MS, [facility] Genetic Counselor [email or web address] (P) [phone]

## 2023-08-01 ENCOUNTER — Other Ambulatory Visit: Payer: Self-pay | Admitting: Family Medicine

## 2023-08-01 ENCOUNTER — Other Ambulatory Visit: Payer: Self-pay | Admitting: General Surgery

## 2023-08-01 ENCOUNTER — Ambulatory Visit
Admission: RE | Admit: 2023-08-01 | Discharge: 2023-08-01 | Disposition: A | Discharge status: Home / Self Care | Payer: Medicare PPO | Source: Ambulatory Visit | Attending: General Surgery | Admitting: General Surgery

## 2023-08-01 DIAGNOSIS — C50412 Malignant neoplasm of upper-outer quadrant of left female breast: Secondary | ICD-10-CM

## 2023-08-01 DIAGNOSIS — E7800 Pure hypercholesterolemia, unspecified: Secondary | ICD-10-CM

## 2023-08-02 ENCOUNTER — Ambulatory Visit (HOSPITAL_BASED_OUTPATIENT_CLINIC_OR_DEPARTMENT_OTHER)
Admission: RE | Admit: 2023-08-02 | Discharge: 2023-08-02 | Disposition: A | Discharge status: Home / Self Care | Payer: Medicare PPO | Attending: General Surgery | Admitting: General Surgery

## 2023-08-02 ENCOUNTER — Encounter (HOSPITAL_BASED_OUTPATIENT_CLINIC_OR_DEPARTMENT_OTHER): Payer: Self-pay | Admitting: General Surgery

## 2023-08-02 ENCOUNTER — Other Ambulatory Visit: Payer: Self-pay

## 2023-08-02 ENCOUNTER — Ambulatory Visit (HOSPITAL_BASED_OUTPATIENT_CLINIC_OR_DEPARTMENT_OTHER): Payer: Self-pay | Admitting: Anesthesiology

## 2023-08-02 ENCOUNTER — Encounter (HOSPITAL_BASED_OUTPATIENT_CLINIC_OR_DEPARTMENT_OTHER)
Admission: RE | Disposition: A | Discharge status: Home / Self Care | Payer: Self-pay | Source: Home / Self Care | Attending: General Surgery

## 2023-08-02 ENCOUNTER — Ambulatory Visit
Admission: RE | Admit: 2023-08-02 | Discharge: 2023-08-02 | Disposition: A | Discharge status: Home / Self Care | Payer: Medicare PPO | Source: Ambulatory Visit | Attending: General Surgery | Admitting: General Surgery

## 2023-08-02 ENCOUNTER — Inpatient Hospital Stay
Admission: RE | Admit: 2023-08-02 | Discharge: 2023-08-02 | Discharge status: Home / Self Care | Payer: Medicare PPO | Source: Ambulatory Visit | Attending: General Surgery | Admitting: General Surgery

## 2023-08-02 DIAGNOSIS — Z1721 Progesterone receptor positive status: Secondary | ICD-10-CM | POA: Insufficient documentation

## 2023-08-02 DIAGNOSIS — Z90710 Acquired absence of both cervix and uterus: Secondary | ICD-10-CM | POA: Insufficient documentation

## 2023-08-02 DIAGNOSIS — C50912 Malignant neoplasm of unspecified site of left female breast: Secondary | ICD-10-CM | POA: Diagnosis not present

## 2023-08-02 DIAGNOSIS — Z8673 Personal history of transient ischemic attack (TIA), and cerebral infarction without residual deficits: Secondary | ICD-10-CM | POA: Insufficient documentation

## 2023-08-02 DIAGNOSIS — E039 Hypothyroidism, unspecified: Secondary | ICD-10-CM | POA: Diagnosis not present

## 2023-08-02 DIAGNOSIS — Z803 Family history of malignant neoplasm of breast: Secondary | ICD-10-CM | POA: Diagnosis not present

## 2023-08-02 DIAGNOSIS — G4730 Sleep apnea, unspecified: Secondary | ICD-10-CM | POA: Diagnosis not present

## 2023-08-02 DIAGNOSIS — Z87891 Personal history of nicotine dependence: Secondary | ICD-10-CM | POA: Insufficient documentation

## 2023-08-02 DIAGNOSIS — C50911 Malignant neoplasm of unspecified site of right female breast: Secondary | ICD-10-CM

## 2023-08-02 DIAGNOSIS — E785 Hyperlipidemia, unspecified: Secondary | ICD-10-CM | POA: Diagnosis not present

## 2023-08-02 DIAGNOSIS — C50412 Malignant neoplasm of upper-outer quadrant of left female breast: Secondary | ICD-10-CM | POA: Diagnosis not present

## 2023-08-02 DIAGNOSIS — Z170 Estrogen receptor positive status [ER+]: Secondary | ICD-10-CM | POA: Diagnosis not present

## 2023-08-02 DIAGNOSIS — Z79899 Other long term (current) drug therapy: Secondary | ICD-10-CM | POA: Insufficient documentation

## 2023-08-02 DIAGNOSIS — I10 Essential (primary) hypertension: Secondary | ICD-10-CM | POA: Diagnosis not present

## 2023-08-02 DIAGNOSIS — Z7902 Long term (current) use of antithrombotics/antiplatelets: Secondary | ICD-10-CM | POA: Insufficient documentation

## 2023-08-02 DIAGNOSIS — N6489 Other specified disorders of breast: Secondary | ICD-10-CM | POA: Insufficient documentation

## 2023-08-02 DIAGNOSIS — D0512 Intraductal carcinoma in situ of left breast: Secondary | ICD-10-CM | POA: Diagnosis not present

## 2023-08-02 HISTORY — PX: BREAST LUMPECTOMY WITH RADIOACTIVE SEED LOCALIZATION: SHX6424

## 2023-08-02 HISTORY — PX: BREAST BIOPSY: SHX20

## 2023-08-02 SURGERY — BREAST LUMPECTOMY WITH RADIOACTIVE SEED LOCALIZATION
Anesthesia: General | Site: Breast | Laterality: Left

## 2023-08-02 MED ORDER — BUPIVACAINE HCL (PF) 0.25 % IJ SOLN
INTRAMUSCULAR | Status: AC
  Filled 2023-08-02: qty 60

## 2023-08-02 MED ORDER — LIDOCAINE-EPINEPHRINE (PF) 1 %-1:200000 IJ SOLN
INTRAMUSCULAR | Status: DC | PRN
  Administered 2023-08-02: 60 mL

## 2023-08-02 MED ORDER — FENTANYL CITRATE (PF) 100 MCG/2ML IJ SOLN
INTRAMUSCULAR | Status: AC
  Filled 2023-08-02: qty 2

## 2023-08-02 MED ORDER — PROPOFOL 10 MG/ML IV BOLUS
INTRAVENOUS | Status: DC | PRN
  Administered 2023-08-02: 150 mg via INTRAVENOUS
  Administered 2023-08-02: 50 mg via INTRAVENOUS

## 2023-08-02 MED ORDER — OXYCODONE HCL 5 MG/5ML PO SOLN: 5.0000 mg | Freq: Once | ORAL | Status: DC | PRN

## 2023-08-02 MED ORDER — AMISULPRIDE (ANTIEMETIC) 5 MG/2ML IV SOLN: 10.0000 mg | Freq: Once | INTRAVENOUS | Status: DC | PRN

## 2023-08-02 MED ORDER — ONDANSETRON HCL 4 MG/2ML IJ SOLN
INTRAMUSCULAR | Status: DC | PRN
  Administered 2023-08-02: 4 mg via INTRAVENOUS

## 2023-08-02 MED ORDER — CIPROFLOXACIN IN D5W 400 MG/200ML IV SOLN
INTRAVENOUS | Status: AC
  Filled 2023-08-02: qty 200

## 2023-08-02 MED ORDER — FENTANYL CITRATE (PF) 100 MCG/2ML IJ SOLN
INTRAMUSCULAR | Status: DC | PRN
  Administered 2023-08-02 (×2): 50 ug via INTRAVENOUS

## 2023-08-02 MED ORDER — ACETAMINOPHEN 500 MG PO TABS
1000.0000 mg | ORAL_TABLET | ORAL | Status: AC
  Administered 2023-08-02: 1000 mg via ORAL

## 2023-08-02 MED ORDER — ALBUTEROL SULFATE (2.5 MG/3ML) 0.083% IN NEBU
2.5000 mg | INHALATION_SOLUTION | Freq: Four times a day (QID) | RESPIRATORY_TRACT | Status: DC | PRN
  Administered 2023-08-02: 2.5 mg via RESPIRATORY_TRACT

## 2023-08-02 MED ORDER — CIPROFLOXACIN IN D5W 400 MG/200ML IV SOLN
400.0000 mg | INTRAVENOUS | Status: AC
  Administered 2023-08-02: 400 mg via INTRAVENOUS

## 2023-08-02 MED ORDER — LIDOCAINE-EPINEPHRINE (PF) 1 %-1:200000 IJ SOLN
INTRAMUSCULAR | Status: AC
  Filled 2023-08-02: qty 60

## 2023-08-02 MED ORDER — OXYCODONE HCL 5 MG PO TABS: 5.0000 mg | ORAL_TABLET | Freq: Once | ORAL | Status: DC | PRN

## 2023-08-02 MED ORDER — PROPOFOL 500 MG/50ML IV EMUL
INTRAVENOUS | Status: DC | PRN
  Administered 2023-08-02: 200 ug/kg/min via INTRAVENOUS

## 2023-08-02 MED ORDER — DEXAMETHASONE SODIUM PHOSPHATE 4 MG/ML IJ SOLN
INTRAMUSCULAR | Status: DC | PRN
  Administered 2023-08-02: 5 mg via INTRAVENOUS

## 2023-08-02 MED ORDER — ALBUTEROL SULFATE (2.5 MG/3ML) 0.083% IN NEBU
INHALATION_SOLUTION | RESPIRATORY_TRACT | Status: AC
  Filled 2023-08-02: qty 3

## 2023-08-02 MED ORDER — ACETAMINOPHEN 500 MG PO TABS
ORAL_TABLET | ORAL | Status: AC
  Filled 2023-08-02: qty 2

## 2023-08-02 MED ORDER — LIDOCAINE HCL (CARDIAC) PF 100 MG/5ML IV SOSY
PREFILLED_SYRINGE | INTRAVENOUS | Status: DC | PRN
  Administered 2023-08-02: 100 mg via INTRAVENOUS

## 2023-08-02 MED ORDER — LACTATED RINGERS IV SOLN: INTRAVENOUS | Status: DC

## 2023-08-02 MED ORDER — FENTANYL CITRATE (PF) 100 MCG/2ML IJ SOLN
25.0000 ug | INTRAMUSCULAR | Status: DC | PRN
  Administered 2023-08-02 (×2): 50 ug via INTRAVENOUS

## 2023-08-02 SURGICAL SUPPLY — 55 items
BINDER BREAST LRG (GAUZE/BANDAGES/DRESSINGS) IMPLANT
BINDER BREAST MEDIUM (GAUZE/BANDAGES/DRESSINGS) IMPLANT
BINDER BREAST XLRG (GAUZE/BANDAGES/DRESSINGS) IMPLANT
BINDER BREAST XXLRG (GAUZE/BANDAGES/DRESSINGS) IMPLANT
BLADE SURG 10 STRL SS (BLADE) ×2 IMPLANT
BLADE SURG 15 STRL LF DISP TIS (BLADE) IMPLANT
BLADE SURG 15 STRL SS (BLADE)
CANISTER SUC SOCK COL 7IN (MISCELLANEOUS) IMPLANT
CANISTER SUCT 1200ML W/VALVE (MISCELLANEOUS) IMPLANT
CHLORAPREP W/TINT 26 (MISCELLANEOUS) ×2 IMPLANT
CLIP TI LARGE 6 (CLIP) ×2 IMPLANT
CLIP TI MEDIUM 6 (CLIP) IMPLANT
COVER BACK TABLE 60X90IN (DRAPES) ×2 IMPLANT
COVER MAYO STAND STRL (DRAPES) ×2 IMPLANT
COVER PROBE CYLINDRICAL 5X96 (MISCELLANEOUS) ×2 IMPLANT
DERMABOND ADVANCED .7 DNX12 (GAUZE/BANDAGES/DRESSINGS) ×2 IMPLANT
DRAPE LAPAROSCOPIC ABDOMINAL (DRAPES) ×2 IMPLANT
DRAPE UTILITY XL STRL (DRAPES) ×2 IMPLANT
ELECT COATED BLADE 2.86 ST (ELECTRODE) ×2 IMPLANT
ELECT REM PT RETURN 9FT ADLT (ELECTROSURGICAL) ×1
ELECTRODE REM PT RTRN 9FT ADLT (ELECTROSURGICAL) ×2 IMPLANT
GAUZE SPONGE 4X4 12PLY STRL LF (GAUZE/BANDAGES/DRESSINGS) ×2 IMPLANT
GLOVE BIO SURGEON STRL SZ 6 (GLOVE) ×2 IMPLANT
GLOVE BIOGEL PI IND STRL 6.5 (GLOVE) ×2 IMPLANT
GLOVE BIOGEL PI IND STRL 7.0 (GLOVE) IMPLANT
GLOVE BIOGEL PI IND STRL 7.5 (GLOVE) IMPLANT
GLOVE SURG SS PI 6.5 STRL IVOR (GLOVE) IMPLANT
GLOVE SURG SYN 7.5 E (GLOVE) ×1
GLOVE SURG SYN 7.5 PF PI (GLOVE) IMPLANT
GOWN STRL REUS W/ TWL LRG LVL3 (GOWN DISPOSABLE) ×2 IMPLANT
GOWN STRL REUS W/ TWL XL LVL3 (GOWN DISPOSABLE) ×2 IMPLANT
GOWN STRL REUS W/TWL LRG LVL3 (GOWN DISPOSABLE) ×1
GOWN STRL REUS W/TWL XL LVL3 (GOWN DISPOSABLE) ×2
KIT MARKER MARGIN INK (KITS) ×2 IMPLANT
LIGHT WAVEGUIDE WIDE FLAT (MISCELLANEOUS) IMPLANT
NDL HYPO 25X1 1.5 SAFETY (NEEDLE) ×2 IMPLANT
NEEDLE HYPO 25X1 1.5 SAFETY (NEEDLE) ×1
NS IRRIG 1000ML POUR BTL (IV SOLUTION) ×2 IMPLANT
PACK BASIN DAY SURGERY FS (CUSTOM PROCEDURE TRAY) ×2 IMPLANT
PENCIL SMOKE EVACUATOR (MISCELLANEOUS) ×2 IMPLANT
SLEEVE SCD COMPRESS KNEE MED (STOCKING) ×2 IMPLANT
SPIKE FLUID TRANSFER (MISCELLANEOUS) IMPLANT
SPONGE T-LAP 18X18 ~~LOC~~+RFID (SPONGE) ×2 IMPLANT
STRIP CLOSURE SKIN 1/2X4 (GAUZE/BANDAGES/DRESSINGS) ×2 IMPLANT
SUT MNCRL AB 4-0 PS2 18 (SUTURE) ×2 IMPLANT
SUT SILK 2 0 SH (SUTURE) IMPLANT
SUT VIC AB 2-0 SH 27 (SUTURE) ×1
SUT VIC AB 2-0 SH 27XBRD (SUTURE) ×2 IMPLANT
SUT VIC AB 3-0 SH 27 (SUTURE) ×1
SUT VIC AB 3-0 SH 27X BRD (SUTURE) ×2 IMPLANT
SYR CONTROL 10ML LL (SYRINGE) ×2 IMPLANT
TOWEL GREEN STERILE FF (TOWEL DISPOSABLE) ×2 IMPLANT
TRAY FAXITRON CT DISP (TRAY / TRAY PROCEDURE) ×2 IMPLANT
TUBE CONNECTING 20X1/4 (TUBING) IMPLANT
YANKAUER SUCT BULB TIP NO VENT (SUCTIONS) IMPLANT

## 2023-08-02 NOTE — Transfer of Care (Signed)
Immediate Anesthesia Transfer of Care Note  Patient: Abigail [name]  Procedure(s) Performed: Gwendlyn Deutscher LOCALIZED LEFT BREAST LUMPECTOMY (Left: Breast)  Patient Location: PACU  Anesthesia Type:General  Level of Consciousness: awake, alert , oriented, drowsy, and patient cooperative  Airway & Oxygen Therapy: Patient Spontanous Breathing and Patient connected to face mask oxygen  Post-op Assessment: Report given to RN and Post -op Vital signs reviewed and stable  Post vital signs: Reviewed and stable  Last Vitals:  Vitals Value Taken Time  BP    Temp    Pulse 70 08/02/23 1444  Resp 23 08/02/23 1444  SpO2 98 % 08/02/23 1444  Vitals shown include unfiled device data.  Last Pain:  Vitals:   08/02/23 1304  TempSrc: Oral  PainSc: 0-No pain      Patients Stated Pain Goal: 6 (08/02/23 1304)  Complications: No notable events documented.

## 2023-08-02 NOTE — Anesthesia Procedure Notes (Signed)
Procedure Name: LMA Insertion Date/Time: 08/02/2023 1:38 PM  Performed by: Ronnette Hila, CRNAPre-anesthesia Checklist: Patient identified, Emergency Drugs available, Suction available and Patient being monitored Patient Re-evaluated:Patient Re-evaluated prior to induction Oxygen Delivery Method: Circle System Utilized Preoxygenation: Pre-oxygenation with 100% oxygen Induction Type: IV induction Ventilation: Mask ventilation without difficulty LMA: LMA inserted LMA Size: 3.0 Number of attempts: 1 Airway Equipment and Method: bite block Placement Confirmation: positive ETCO2 Tube secured with: Tape Dental Injury: Teeth and Oropharynx as per pre-operative assessment

## 2023-08-02 NOTE — Progress Notes (Signed)
Pt began hyperventiliating. Dr. Drue Novel notified. Breast binder loosened and helped with patient's complaints of being unable to catch her breath. Breast binder exchanged for larger size. Vitals remain stable, lung sounds clear.

## 2023-08-02 NOTE — H&P (Signed)
REFERRING PHYSICIAN: Pete Glatter  PROVIDER: Matthias Hughs, MD  Care Team: Patient Care Team: Dwaine Deter, MD as PCP - General (Family Medicine) Matthias Hughs, MD as Consulting Provider (Surgical Oncology) Malachy Mood, MD (Hematology and Oncology) Buckner Malta, MD (Radiation Oncology)   MRN: [id number] DOB: 1946/07/05 DATE OF ENCOUNTER: 07/05/2023  Subjective   Chief Complaint: New Consultation (Breast Clinic)   History of Present Illness: Abigail [name] is a 77 y.o. female who is seen today as an office consultation at the request of Dr. Mosetta Putt for evaluation of New Consultation (Breast Clinic) .   Patient presents with a new diagnosis of left breast cancer October 2024. The patient was noted to have screening detected calcifications. Diagnostic imaging was performed which showed 1.7 cm of calcifications in the upper outer quadrant on the left. Core needle biopsy was performed. This demonstrated intermediate grade ductal carcinoma in situ with focal necrosis also involving a papilloma. This was hormone positive with strong staining at 90% and 20%. Patient was noted to have breast density B.  Patient is s/p back surgery 02/2023 by Dr. Yevette Edwards. She is still in recovery and doing PT. She has had brief hiatus in PT with breast hematoma.   Of note, the patient has had 2 sisters with breast cancer.  Family cancer history - two sisters with breast cancer history, father with h/o prostate CA Menarche -84 Menopause - with hysterectomy Parity - P3 with first age 38  Work - retired Presenter, broadcasting  Diagnostic mammogram: [facility] 06/23/23  ACR Breast Density Category b: There are scattered areas of fibroglandular density.  FINDINGS: Spot compression magnification views were performed of the outer left breast demonstrating a 1.7 cm somewhat linear oriented group of pleomorphic calcifications.  IMPRESSION: Suspicious 1.7 cm group of calcifications in the  outer left breast.  RECOMMENDATION: Recommend stereotactic guided biopsy of the calcifications in the outer left breast.  I have discussed the findings and recommendations with the patient. If applicable, a reminder letter will be sent to the patient regarding the next appointment.  BI-RADS CATEGORY 4: Suspicious.   Pathology core needle biopsy: 06/29/23 1. Breast, left, needle core biopsy, outer, coil clip :  DUCTAL CARCINOMA IN SITU, INTERMEDIATE NUCLEAR GRADE, CRIBRIFORM TYPE WITH FOCAL  NECROSIS  NEGATIVE FOR INVASIVE CARCINOMA  DCIS FOCALLY INVOLVES A SMALL INTRADUCTAL PAPILLOMA  MICROCALCIFICATIONS PRESENT WITHIN DCIS AND PERITUMORAL STROMA  DCIS MEASURES 4.3 MM IN GREATEST LINEAR EXTENT   Receptors: IMMUNOHISTOCHEMICAL AND MORPHOMETRIC ANALYSIS PERFORMED MANUALLY Estrogen Receptor: 90%, POSITIVE, STRONG STAINING INTENSITY Progesterone Receptor: 20%, POSITIVE, WEAK-MODERATE STAINING INTENSITY   Review of Systems: A complete review of systems was obtained from the patient. I have reviewed this information and discussed as appropriate with the patient. See HPI as well for other ROS. ROS - sleep loss, chills, pain, shortness of breath, joint pain, arthritis, blood clots.   Medical History: Past Medical History:  Diagnosis Date  Hypertension  Sleep apnea  Thyroid disease   Patient Active Problem List  Diagnosis  Malignant neoplasm of upper-outer quadrant of left breast in female, estrogen receptor positive (CMS/HHS-HCC)  Family history of breast cancer  History of CVA (cerebrovascular accident)  Platelet inhibition due to Plavix   Past Surgical History:  Procedure Laterality Date  BREAST EXCISIONAL BIOPSY  COLON SURGERY  HERNIA REPAIR  LAPAROSCOPIC TUBAL LIGATION  POSTERIOR FUSION LUMBAR SPINE  SALPINGO OOPHORECTOMY    Allergies  Allergen Reactions  Aspirin-Dipyridamole Other (See Comments) and Nausea And Vomiting  Headache  Doxycycline Monohydrate  Diarrhea and Other (See Comments)  Codeine Other (See Comments) and Nausea And Vomiting  Meperidine Other (See Comments) and Nausea And Vomiting   Current Outpatient Medications on File Prior to Visit  Medication Sig Dispense Refill  amLODIPine (NORVASC) 10 MG tablet Take 1 tablet by mouth once daily  atorvastatin (LIPITOR) 40 MG tablet Take 1 tablet by mouth once daily  clopidogreL (PLAVIX) 75 mg tablet Take 75 mg by mouth once daily  levothyroxine (SYNTHROID) 125 MCG tablet levothyroxine 125 mcg tablet TAKE 1 TABLET (125 MCG TOTAL) BY MOUTH DAILY BEFORE BREAKFAST.  losartan (COZAAR) 100 MG tablet Take 100 mg by mouth once daily  metoprolol succinate (TOPROL-XL) 25 MG XL tablet TAKE 1 TABLET BY MOUTH EVERY DAY. (NEED APPT FOR REFILLS)  potassium chloride (KLOR-CON) 10 MEQ ER tablet Take by mouth  traMADoL (ULTRAM) 50 mg tablet   No current facility-administered medications on file prior to visit.   Family History  Problem Relation Age of Onset  Stroke Mother  High blood pressure (Hypertension) Mother  Breast cancer Sister    Social History   Tobacco Use  Smoking Status Never  Smokeless Tobacco Never    Social History   Socioeconomic History  Marital status: Married  Tobacco Use  Smoking status: Never  Smokeless tobacco: Never  Vaping Use  Vaping status: Never Used  Substance and Sexual Activity  Alcohol use: Not Currently  Drug use: Never   Objective:   Vitals:  07/05/23 0851  BP: (!) 145/66  Pulse: 64  Resp: 18  Temp: 36.3 C (97.3 F)  Weight: 90.1 kg (198 lb 9.6 oz)  Height: 165.1 cm (5\' 5" )  PainSc: 0-No pain   Body mass index is 33.05 kg/m.  Gen: No acute distress. Well nourished and well groomed.  Neurological: Alert and oriented to person, place, and time. Coordination normal.  Head: Normocephalic and atraumatic.  Eyes: Conjunctivae are normal. Pupils are equal, round, and reactive to light. No scleral icterus.  Neck: Normal range of motion. Neck  supple. No tracheal deviation or thyromegaly present.  Cardiovascular: Normal rate, regular rhythm, normal heart sounds and intact distal pulses. Exam reveals no gallop and no friction rub. No murmur heard. Breast: - significant hematoma laterally on left. No palpable masses. No LAD. No nipple retraction or discharge. No skin dimpling. Right side benign.  Respiratory: Effort normal. No respiratory distress. No chest wall tenderness. Breath sounds normal. No wheezes, rales or rhonchi.  GI: Soft. Bowel sounds are normal. The abdomen is soft and nontender. There is no rebound and no guarding.  Musculoskeletal: Normal range of motion. Extremities are nontender.  Lymphadenopathy: No cervical, preauricular, postauricular or axillary adenopathy is present Skin: Skin is warm and dry. No rash noted. No diaphoresis. No erythema. No pallor. No clubbing, cyanosis, or edema.  Psychiatric: Normal mood and affect. Behavior is normal. Judgment and thought content normal.   Labs 07/05/23 CBC ok CMET K 3.3, o/w ok  Assessment and Plan:   ICD-10-CM  1. Malignant neoplasm of upper-outer quadrant of left breast in female, estrogen receptor positive (CMS/HHS-HCC) C50.412  Z17.0   2. Family history of breast cancer Z80.3   3. History of CVA (cerebrovascular accident) Z86.73   4. Platelet inhibition due to Plavix Z79.02    Patient presents with a new diagnosis of stage 0 left breast cancer. This is amenable to breast conservation. Assuming that the patient has a lumpectomy, final pathology will determine whether radiation is recommended or is optional. Antihormonal  treatment will be recommended. The patient is offered genetic referral and testing. Patient is leaning toward XRT and foregoing antihormonal tx due to overall health issues/statin use.   We will obtain "clearance" to hold plavix from Dr. Pearlean Brownie, but I do not anticipate this being an issue as she has held it recently for back surgery without issue.    The surgical procedure was described to the patient. I discussed the incision type and location and that we will need radiology involved with a seed marker 1-2 days pre op.  We discussed the risks bleeding, infection, damage to other structures, need for further procedures/surgeries. We discussed the risk of seroma. The patient was advised if the breast has cancer, we may need to go back to surgery for additional tissue to obtain negative margins or for a lymph node biopsy. The patient was advised that these are the most common complications, but that others can occur as well. I discussed the risk of alteration in breast contour or size. I discussed risk of chronic pain. There are rare instances of heart/lung issues post op as well as blood clots.   They were advised against taking aspirin or other anti-inflammatory agents/blood thinners the week before surgery.   The risks and benefits of the procedure were described to the patient and she wishes to proceed.

## 2023-08-02 NOTE — Progress Notes (Signed)
Albuterol nebulizer started per Dr. Armond Hang

## 2023-08-02 NOTE — Anesthesia Postprocedure Evaluation (Signed)
Anesthesia Post Note  Patient: Abigail [name]  Procedure(s) Performed: WIRE LOCALIZED LEFT BREAST LUMPECTOMY (Left: Breast)     Patient location during evaluation: PACU Anesthesia Type: General Level of consciousness: awake Pain management: pain level controlled Vital Signs Assessment: post-procedure vital signs reviewed and stable Respiratory status: spontaneous breathing, nonlabored ventilation and respiratory function stable Cardiovascular status: blood pressure returned to baseline and stable Postop Assessment: no apparent nausea or vomiting Anesthetic complications: no   No notable events documented.  Last Vitals:  Vitals:   08/02/23 1545 08/02/23 1648  BP: 131/85 133/73  Pulse: 63 66  Resp: 20 18  Temp:  (!) 36.2 C  SpO2: 98% 96%    Last Pain:  Vitals:   08/02/23 1648  TempSrc: Oral  PainSc: 0-No pain                 Linton Rump

## 2023-08-02 NOTE — Anesthesia Preprocedure Evaluation (Addendum)
Anesthesia Evaluation  Patient identified by MRN, date of birth, ID band Patient awake    Reviewed: Allergy & Precautions, NPO status , Patient's Chart, lab work & pertinent test results  History of Anesthesia Complications (+) PONV and history of anesthetic complications (SOB afterwards never requiring admission)  Airway Mallampati: II  TM Distance: >3 FB Neck ROM: Full   Comment: Previous grade I view with MAC 4, easy mask Dental  (+) Dental Advisory Given,    Pulmonary neg shortness of breath, sleep apnea (no CPAP) , neg COPD, neg recent URI, former smoker   Pulmonary exam normal breath sounds clear to auscultation       Cardiovascular hypertension (amlodipine, losartan, metoprolol), Pt. on medications (-) angina (-) Past MI, (-) Cardiac Stents and (-) CABG (-) dysrhythmias  Rhythm:Regular Rate:Normal  HLD, PFO  TTE 07/20/2023: IMPRESSIONS    1. Left ventricular ejection fraction, by estimation, is 65 to 70%. The  left ventricle has normal function. The left ventricle has no regional  wall motion abnormalities. There is moderate asymmetric left ventricular  hypertrophy. Left ventricular  diastolic parameters were normal.   2. Right ventricular systolic function is normal. The right ventricular  size is normal.   3. Trivial mitral valve regurgitation.   4. The aortic valve is tricuspid. Aortic valve regurgitation is not  visualized. Aortic valve sclerosis/calcification is present, without any  evidence of aortic stenosis.     Neuro/Psych  Headaches, neg Seizures PSYCHIATRIC DISORDERS  Depression    vertigo TIA (2009, 2016) Neuromuscular disease (lumbar radiculopathy) CVA (2012)    GI/Hepatic negative GI ROS, Neg liver ROS,,,  Endo/Other  neg diabetesHypothyroidism    Renal/GU negative Renal ROS     Musculoskeletal  (+) Arthritis , Osteoarthritis,    Abdominal  (+) + obese  Peds  Hematology negative  hematology ROS (+)   Anesthesia Other Findings Last Plavix: 07/28/2023  Reproductive/Obstetrics Left breast cancer                             Anesthesia Physical Anesthesia Plan  ASA: 3  Anesthesia Plan: General   Post-op Pain Management: Tylenol PO (pre-op)*   Induction: Intravenous  PONV Risk Score and Plan: 4 or greater and Ondansetron, Dexamethasone, Propofol infusion, TIVA and Treatment may vary due to age or medical condition  Airway Management Planned: LMA  Additional Equipment:   Intra-op Plan:   Post-operative Plan: Extubation in OR  Informed Consent: I have reviewed the patients History and Physical, chart, labs and discussed the procedure including the risks, benefits and alternatives for the proposed anesthesia with the patient or authorized representative who has indicated his/her understanding and acceptance.     Dental advisory given  Plan Discussed with: CRNA and Anesthesiologist  Anesthesia Plan Comments: (Risks of general anesthesia discussed including, but not limited to, sore throat, hoarse voice, chipped/damaged teeth, injury to vocal cords, nausea and vomiting, allergic reactions, lung infection, heart attack, stroke, and death. All questions answered. )        Anesthesia Quick Evaluation

## 2023-08-02 NOTE — Discharge Instructions (Addendum)
[facility] Office Phone Number [phone]  BREAST BIOPSY/ PARTIAL MASTECTOMY: POST OP INSTRUCTIONS  Always review your discharge instruction sheet given to you by the facility where your surgery was performed.  IF YOU HAVE DISABILITY OR FAMILY LEAVE FORMS, YOU MUST BRING THEM TO THE OFFICE FOR PROCESSING.  DO NOT GIVE THEM TO YOUR DOCTOR.  Take 2 tylenol (acetominophen) three times a day for 3 days.  If you still have pain, add ibuprofen with food in between if able to take this (if you have kidney issues or stomach issues, do not take ibuprofen).  If both of those are not enough, add the narcotic pain pill.  If you find you are needing a lot of this overnight after surgery, call the next morning for a refill.   Next tylenol dose 7pm  Prescriptions will not be filled after 5pm or on week-ends. Take your usually prescribed medications unless otherwise directed You should eat very light the first 24 hours after surgery, such as soup, crackers, pudding, etc.  Resume your normal diet the day after surgery. Most patients will experience some swelling and bruising in the breast.  Ice packs and a good support bra will help.  Swelling and bruising can take several days to resolve.  It is common to experience some constipation if taking pain medication after surgery.  Increasing fluid intake and taking a stool softener will usually help or prevent this problem from occurring.  A mild laxative (Milk of Magnesia or Miralax) should be taken according to package directions if there are no bowel movements after 48 hours. Unless discharge instructions indicate otherwise, you may remove your bandages 48 hours after surgery, and you may shower at that time.  You may have steri-strips (small skin tapes) in place directly over the incision.  These strips should be left on the skin at least for for 7-10 days.    ACTIVITIES:  You may resume regular daily activities (gradually increasing) beginning the  next day.  Wearing a good support bra or sports bra (or the breast binder) minimizes pain and swelling.  You may have sexual intercourse when it is comfortable. No heavy lifting for 1-2 weeks (not over around 10 pounds).  You may drive when you no longer are taking prescription pain medication, you can comfortably wear a seatbelt, and you can safely maneuver your car and apply brakes. RETURN TO WORK:  __________3-14 days depending on job. _______________ Abigail [name] should see your doctor in the office for a follow-up appointment approximately two weeks after your surgery.  Your doctor's nurse will typically make your follow-up appointment when she calls you with your pathology report.  Expect your pathology report 3-4 business days after your surgery.  You may call to check if you do not hear from [place] after three days.   WHEN TO CALL YOUR DOCTOR: Fever over 101.0 Nausea and/or vomiting. Extreme swelling or bruising. Continued bleeding from incision. Increased pain, redness, or dra Post Anesthesia Home Care Instructions  Activity: Get plenty of rest for the remainder of the day. A responsible individual must stay with you for 24 hours following the procedure.  For the next 24 hours, DO NOT: -Drive a car -Advertising copywriter -Drink alcoholic beverages -Take any medication unless instructed by your physician -Make any legal decisions or sign important papers.  Meals: Start with liquid foods such as gelatin or soup. Progress to regular foods as tolerated. Avoid greasy, spicy, heavy foods. If nausea and/or vomiting occur, drink only clear liquids  until the nausea and/or vomiting subsides. Call your physician if vomiting continues.  Special Instructions/Symptoms: Your throat may feel dry or sore from the anesthesia or the breathing tube placed in your throat during surgery. If this causes discomfort, gargle with warm salt water. The discomfort should disappear within 24 hours.  If you had a scopolamine  patch placed behind your ear for the management of post- operative nausea and/or vomiting:  1. The medication in the patch is effective for 72 hours, after which it should be removed.  Wrap patch in a tissue and discard in the trash. Wash hands thoroughly with soap and water. 2. You may remove the patch earlier than 72 hours if you experience unpleasant side effects which may include dry mouth, dizziness or visual disturbances. 3. Avoid touching the patch. Wash your hands with soap and water after contact with the patch.    Post Anesthesia Home Care Instructions  Activity: Get plenty of rest for the remainder of the day. A responsible individual must stay with you for 24 hours following the procedure.  For the next 24 hours, DO NOT: -Drive a car -Advertising copywriter -Drink alcoholic beverages -Take any medication unless instructed by your physician -Make any legal decisions or sign important papers.  Meals: Start with liquid foods such as gelatin or soup. Progress to regular foods as tolerated. Avoid greasy, spicy, heavy foods. If nausea and/or vomiting occur, drink only clear liquids until the nausea and/or vomiting subsides. Call your physician if vomiting continues.  Special Instructions/Symptoms: Your throat may feel dry or sore from the anesthesia or the breathing tube placed in your throat during surgery. If this causes discomfort, gargle with warm salt water. The discomfort should disappear within 24 hours.  If you had a scopolamine patch placed behind your ear for the management of post- operative nausea and/or vomiting:  1. The medication in the patch is effective for 72 hours, after which it should be removed.  Wrap patch in a tissue and discard in the trash. Wash hands thoroughly with soap and water. 2. You may remove the patch earlier than 72 hours if you experience unpleasant side effects which may include dry mouth, dizziness or visual disturbances. 3. Avoid touching the patch.  Wash your hands with soap and water after contact with the patch.   inage from the incision.  The clinic staff is available to answer your questions during regular business hours.  Please don't hesitate to call and ask to speak to one of the nurses for clinical concerns.  If you have a medical emergency, go to the nearest emergency room or call 911.  A surgeon from [facility] is always on call at the hospital.  For further questions, please visit [email or web address]

## 2023-08-02 NOTE — Op Note (Signed)
Left Breast wire localized lumpectomy  Indications: This patient presents with history of left breast cancer, upper outer quadrant, cTis, intermediate grade with focal necrosis, receptors +/+  Pre-operative Diagnosis: left breast cancer and hematoma  Post-operative Diagnosis: Same  Surgeon: Almond Lint   Anesthesia: General endotracheal anesthesia  ASA Class: 3  Procedure Details  The patient was seen in the Holding Room. The risks, benefits, complications, treatment options, and expected outcomes were discussed with the patient. The possibilities of bleeding, infection, the need for additional procedures, failure to diagnose a condition, and creating a complication requiring other procedures or operations were discussed with the patient. The patient concurred with the proposed plan, giving informed consent.  The site of surgery properly noted/marked. The patient was taken to Operating Room # 8, identified, and the procedure verified as left breast seed localized lumpectomy.  The left breast and chest were prepped and draped in standard fashion. A transverse lateral incision was made near the site of the wire.  Dissection was carried down through the breast tissue to incorporate the tissue around wire.  The hematoma was evacuated. The cautery was used to perform the dissection.   The specimen was inked with the margin marker paint kit.    Specimen radiography confirmed inclusion of the mammographic lesion, the clip, and the wire.  Hemostasis was achieved with cautery.  The cavity was marked with clips on each border other than the anterior border.  The wound was irrigated and closed with 3-0 vicryl interrupted deep dermal sutures and 4-0 monocryl running subcuticular suture.      Sterile dressings were applied. At the end of the operation, all sponge, instrument, and needle counts were correct.   Findings: Clip in specimen.  anterior margin is skin   Estimated Blood Loss:  min          Specimens: left breast tissue with clip and wire         Complications:  None; patient tolerated the procedure well.         Disposition: PACU - hemodynamically stable.         Condition: stable

## 2023-08-02 NOTE — Interval H&P Note (Signed)
History and Physical Interval Note:  08/02/2023 1:04 PM  Abigail [name]  has presented today for surgery, with the diagnosis of LEFT BREAST CANCER.  The various methods of treatment have been discussed with the patient and family. After consideration of risks, benefits and other options for treatment, the patient has consented to  Procedure(s) with comments: LEFT BREAST LUMPECTOMY WITH RADIOACTIVE SEED LOCALIZATION (Left) - 60 MINUTES as a surgical intervention.  The patient's history has been reviewed, patient examined, no change in status, stable for surgery.  I have reviewed the patient's chart and labs.  Questions were answered to the patient's satisfaction.     Almond Lint

## 2023-08-03 ENCOUNTER — Encounter (HOSPITAL_BASED_OUTPATIENT_CLINIC_OR_DEPARTMENT_OTHER): Payer: Self-pay | Admitting: General Surgery

## 2023-08-04 LAB — SURGICAL PATHOLOGY

## 2023-08-07 ENCOUNTER — Encounter: Payer: Self-pay | Admitting: *Deleted

## 2023-08-28 DIAGNOSIS — Z170 Estrogen receptor positive status [ER+]: Secondary | ICD-10-CM | POA: Diagnosis not present

## 2023-08-28 DIAGNOSIS — C50412 Malignant neoplasm of upper-outer quadrant of left female breast: Secondary | ICD-10-CM | POA: Diagnosis not present

## 2023-08-28 NOTE — Progress Notes (Signed)
Radiation Oncology         [phone] ________________________________  Name: Abigail [name] MRN: [id number]  Date: 08/29/2023  DOB: 08/08/1946  Follow-Up Visit Note  Outpatient  CC: Nelwyn Salisbury, MD  Malachy Mood, MD  Diagnosis:   No diagnosis found.   Stage 0 (cTis (DCIS), cN0, cM0) Outer Left Breast, Intermediate grade DCIS, ER+ / PR+ / Her2 not assessed : s/p left lumpectomy without SLN evaluation   CHIEF COMPLAINT: Here to discuss management of left breast cancer  Narrative:  The patient returns today for follow-up.     On her breast clinic consultation date of 07/05/23, she underwent genetic testing which showed no clinically significant variants detected by BRCA+ or +RNAinsight testing.    Since her consultation date, she opted to proceed with a left breast lumpectomy without SLN evaluation on 08/02/23 under the care of Dr. Donell Beers. Pathology from the procedure revealed: tumor/DCIS size of 4.3 mm; histology of intermediate grade ductal carcinoma in situ with necrosis; all margins negative for DCIS; margin status to in situ disease of 1.2 mm from the posterior margin. ER status: 90% positive with strong staining intensity; PR status 20% positive with weak-moderate staining intensity, Her2 not assessed.   She was evaluated by Dr. Mosetta Putt on her breast clinic consultation date. Due to her advanced age, and overall estimated 15% risk of future breast cancer in the next 10 years, Dr. Mosetta Putt states that she does not need both antiestrogen therapy and radiation therapy. At this time, she is interested in proceeding with radiation therapy alone which we will discuss in detail today.   Symptomatically, the patient reports: ***        ALLERGIES:  is allergic to dilaudid [hydromorphone hcl], shellfish allergy, aggrenox [aspirin-dipyridamole er], codeine, doxycycline monohydrate, cephalexin, codeine phosphate, contrast media [iodinated contrast media], hydrocodone, meperidine hcl, and percocet  [oxycodone-acetaminophen].  Meds: Current Outpatient Medications  Medication Sig Dispense Refill   amLODipine (NORVASC) 10 MG tablet TAKE 1 TABLET BY MOUTH EVERY DAY 90 tablet 1   atorvastatin (LIPITOR) 40 MG tablet TAKE 1 TABLET BY MOUTH EVERY DAY 30 tablet 1   butalbital-acetaminophen-caffeine (FIORICET) 50-325-40 MG tablet TAKE 1 TABLET BY MOUTH EVERY 6 HOURS AS NEEDED FOR HEADACHE 60 tablet 5   clopidogrel (PLAVIX) 75 MG tablet Take 75 mg by mouth daily.     ketoconazole (NIZORAL) 2 % cream Apply 1 Application topically 2 (two) times daily. 30 g 2   levothyroxine (SYNTHROID) 125 MCG tablet TAKE 1 TABLET BY MOUTH EVERY DAY BEFORE BREAKFAST 90 tablet 1   losartan (COZAAR) 100 MG tablet TAKE 1 TABLET BY MOUTH EVERY DAY 90 tablet 3   metoprolol succinate (TOPROL-XL) 25 MG 24 hr tablet TAKE 1 TABLET BY MOUTH EVERY DAY. (NEED APPT FOR REFILLS) 90 tablet 1   potassium chloride (KLOR-CON 10) 10 MEQ tablet Take 1 tablet (10 mEq total) by mouth 2 (two) times daily. Take 2 tab twice daily for 5 days 20 tablet 0   potassium chloride (KLOR-CON M) 10 MEQ tablet Take 2 tablets twice a day 360 tablet 3   traMADol (ULTRAM) 50 MG tablet TAKE 1 TO 2 TABLETS BY MOUTH EVERY 4 TO 6 HOURS AS NEEDED FOR PAIN 120 tablet 5   No current facility-administered medications for this encounter.    Physical Findings:  vitals were not taken for this visit. .     General: Alert and oriented, in no acute distress HEENT: Head is normocephalic. Extraocular movements are intact. Oropharynx  is clear. Neck: Neck is supple, no palpable cervical or supraclavicular lymphadenopathy. Heart: Regular in rate and rhythm with no murmurs, rubs, or gallops. Chest: Clear to auscultation bilaterally, with no rhonchi, wheezes, or rales. Abdomen: Soft, nontender, nondistended, with no rigidity or guarding. Extremities: No cyanosis or edema. Lymphatics: see Neck Exam Musculoskeletal: symmetric strength and muscle tone  throughout. Neurologic: No obvious focalities. Speech is fluent.  Psychiatric: Judgment and insight are intact. Affect is appropriate. Breast exam reveals ***  Lab Findings: Lab Results  Component Value Date   WBC 5.0 07/05/2023   HGB 12.0 07/05/2023   HCT 36.5 07/05/2023   MCV 89.2 07/05/2023   PLT 171 07/05/2023    @LASTCHEMISTRY @  Radiographic Findings: MM Breast Surgical Specimen  Result Date: 08/02/2023 CLINICAL DATA:  Status post excision of a left breast lesion following mammographically guided wire localization. Assess surgical specimen. EXAM: SPECIMEN RADIOGRAPH OF THE LEFT BREAST COMPARISON:  Previous exam(s). FINDINGS: Status post excision of the left breast. The wire tip and biopsy marker clip are present and are marked for pathology. IMPRESSION: Specimen radiograph of the left breast. Electronically Signed   By: Amie Portland M.D.   On: 08/02/2023 14:17   MM LT PLC BREAST LOC DEV   1ST LESION  INC MAMMO GUIDE  Result Date: 08/02/2023 CLINICAL DATA:  Patient presents for mammographically guided needle localization of left breast DCIS prior to surgical excision. EXAM: NEEDLE LOCALIZATION OF THE LEFT BREAST WITH MAMMO GUIDANCE COMPARISON:  Previous exam(s). PROCEDURE: Patient presents for needle localization prior to surgical excision. I met with the patient and we discussed the procedure of needle localization including benefits and alternatives. We discussed the high likelihood of a successful procedure. We discussed the risks of the procedure, including infection, bleeding, tissue injury, and further surgery. Informed, written consent was given. The usual time-out protocol was performed immediately prior to the procedure. Using mammographic guidance, sterile technique, 1% lidocaine and a 7 cm modified Kopans needle, the coil shaped post biopsy marker clip was localized using lateral approach. The images were marked for Dr. Donell Beers. IMPRESSION: Needle localization of the left  breast. No apparent complications. Electronically Signed   By: Amie Portland M.D.   On: 08/02/2023 11:54    Impression/Plan: We discussed adjuvant radiotherapy today.  I recommend *** in order to ***.  I reviewed the logistics, benefits, risks, and potential side effects of this treatment in detail. Risks may include but not necessary be limited to acute and late injury tissue in the radiation fields such as skin irritation (change in color/pigmentation, itching, dryness, pain, peeling). She may experience fatigue. We also discussed possible risk of long term cosmetic changes or scar tissue. There is also a smaller risk for lung toxicity, ***cardiac toxicity, ***brachial plexopathy, ***lymphedema, ***musculoskeletal changes, ***rib fragility or ***induction of a second malignancy, ***late chronic non-healing soft tissue wound.    The patient asked good questions which I answered to her satisfaction. She is enthusiastic about proceeding with treatment. A consent form has been *** signed and placed in her chart.  A total of *** medically necessary complex treatment devices will be fabricated and supervised by me: *** fields with MLCs for custom blocks to protect heart, and lungs;  and, a Vac-lok. MORE COMPLEX DEVICES MAY BE MADE IN DOSIMETRY FOR FIELD IN FIELD BEAMS FOR DOSE HOMOGENEITY.  I have requested : 3D Simulation which is medically necessary to give adequate dose to at risk tissues while sparing lungs and heart.  I have requested  a DVH of the following structures: lungs, heart, *** lumpectomy cavity.    The patient will receive *** Gy in *** fractions to the *** with *** fields.  This will be *** followed by a boost.  On date of service, in total, I spent *** minutes on this encounter. Patient was seen in person.  _____________________________________   Lonie Peak, MD  This document serves as a record of services personally performed by Lonie Peak, MD. It was created on her behalf by Neena Rhymes, a trained medical scribe. The creation of this record is based on the scribe's personal observations and the provider's statements to them. This document has been checked and approved by the attending provider.

## 2023-08-29 ENCOUNTER — Ambulatory Visit
Admission: RE | Admit: 2023-08-29 | Discharge: 2023-08-29 | Disposition: A | Discharge status: Home / Self Care | Payer: Medicare PPO | Source: Ambulatory Visit | Attending: Radiation Oncology | Admitting: Radiation Oncology

## 2023-08-29 ENCOUNTER — Encounter: Payer: Self-pay | Admitting: Radiation Oncology

## 2023-08-29 VITALS — BP 144/77 | HR 60 | Resp 18 | Wt 198.4 lb

## 2023-08-29 DIAGNOSIS — D0512 Intraductal carcinoma in situ of left breast: Secondary | ICD-10-CM

## 2023-08-29 DIAGNOSIS — Z170 Estrogen receptor positive status [ER+]: Secondary | ICD-10-CM | POA: Insufficient documentation

## 2023-08-29 DIAGNOSIS — Z79890 Hormone replacement therapy (postmenopausal): Secondary | ICD-10-CM | POA: Insufficient documentation

## 2023-08-29 DIAGNOSIS — Z7902 Long term (current) use of antithrombotics/antiplatelets: Secondary | ICD-10-CM | POA: Insufficient documentation

## 2023-08-29 DIAGNOSIS — Z79899 Other long term (current) drug therapy: Secondary | ICD-10-CM | POA: Insufficient documentation

## 2023-08-29 DIAGNOSIS — Z510 Encounter for antineoplastic radiation therapy: Secondary | ICD-10-CM | POA: Diagnosis not present

## 2023-08-29 DIAGNOSIS — C50412 Malignant neoplasm of upper-outer quadrant of left female breast: Secondary | ICD-10-CM | POA: Diagnosis not present

## 2023-08-29 NOTE — Progress Notes (Signed)
Location of Breast Cancer: Left breast cancer   Histology per Pathology Report:  08/02/2023     FINAL MICROSCOPIC DIAGNOSIS:  A. BREAST, LEFT, LUMPECTOMY:      Ductal carcinoma in situ, solid and cribriform types, nuclear grade 2, with necrosis DCIS greatest dimension: DCIS involves 5 blocks with 4.3 mm in maximal linear dimension Margins:  Negative           Closest margin: 1.2 mm from posterior margin Prognostic markers: ER: 90%, positive, strong staining intensity PR: 20%, positive, weak-moderate staining intensity Biopsy site and clip (coil shaped clip): Identified Other findings: None See oncology table  ONCOLOGY TABLE:  DCIS OF THE BREAST:  Resection  Procedure: Lumpectomy Specimen Laterality: Left Histologic Type: Ductal carcinoma in situ Size of DCIS: Nuclear Grade: DCIS involves 5 blocks with 4.3 mm in maximal linear dimension Necrosis: Identified Margins: All margins negative for DCIS      Specify Closest Margin (required only if <40mm): 1.2 mm from the posterior margin Regional Lymph Nodes: Not applicable (no lymph nodes submitted or found)      Number of Lymph Nodes Examined: 0      Number of Sentinel Nodes Examined (if applicable): 0      Number of Lymph Nodes with Macrometastases: NA      Number of Lymph Nodes with Micrometastases): NA      Number of Lymph Nodes with Isolated Tumor Cells (=0.2 mm or =200 cells): NA      Extranodal Extension: NA Breast Biomarker Testing Performed on Previous Biopsy:      Testing performed on Case Number: [id number]      Estrogen Receptor: 90%, positive, strong staining intensity      Progesterone Receptor: 20%, positive, weak-moderate staining intensity Pathologic Stage Classification (pTNM, AJCC 8th Edition): pTis, pNx Representative Tumor Block: A1, A6, A7, A8, A10 Comment(s): None (v4.4.0.0)   GROSS DESCRIPTION:  Specimen type: Received fresh and placed in formalin at 3:55 PM on 08/02/2023, labeled  wire-guided left breast lumpectomy Size: 5.1 cm superior to inferior by 4.2 cm anterior to posterior by 2.8 cm medial to lateral Orientation: Specimen is received inked as follows: Anterior- green, inferior- blue, lateral- orange, medial- yellow, posterior- black, superior- red. Localized area: The guiding wire is present in the specimen, extending from the anterior to posterior aspect. Cut surface: Yellow lobulated adipose tissue with tan-yellow, indurated tissue.  Within the central aspect of the specimen a red-brown hemorrhagic cavity is identified, measuring 3.0 x 2.5 x 1.5 cm.  Within the cavity silver metallic coil-shaped biopsy clip is identified.  A few scattered possible calcifications are noted on the specimen radiograph, within the indurated tissue surrounding the hemorrhagic cavity. Margins: Margins are inked as previously stated.  The cavity measures 0.4 cm to the anterior margin, 0.4 cm to the medial margin, 0.5 cm to the lateral margin, 0.8 cm to the posterior margin, 1.5 cm to the superior margin, and 1.3 cm to the inferior margin. Prognostic indicators: Obtain from paraffin blocks if needed Block summary: 16 blocks submitted 1 = superior margin, perpendicular 2 and 3 = hemorrhagic cavity with anterior medial margins 4 and 5 = posterior, medial, anterior margins 6-9 = posterior medial margins 10 = anterior, lateral, posterior margins 11-13 = lateral anterior margins 14 and 15 = posterior lateral margins 16 = inferior margin, perpendicular  Lovey Newcomer 08/03/2023)   Receptor Status: Estrogen Receptor: 90%, positive, strong staining intensity      Progesterone Receptor: 20%, positive, weak-moderate staining   Did patient  present with symptoms (if so, please note symptoms) or was this found on screening mammography?: Mammogram  Past/Anticipated interventions by surgeon, 08/02/23  Almond Lint, MD Primary     Procedure Laterality Anesthesia  WIRE LOCALIZED LEFT BREAST  LUMPECTOMY Left General  60 MINUTES         Past/Anticipated interventions by medical oncology, if any:  . Patient presents with a new diagnosis of stage 0 left breast cancer. This is amenable to breast conservation. Assuming that the patient has a lumpectomy, final pathology will determine whether radiation is recommended or is optional. Antihormonal treatment will be recommended. The patient is offered genetic referral and testing. Patient is leaning toward XRT and foregoing antihormonal tx due to overall health issues/statin use.   Lymphedema issues, if any:  Some swelling   Pain issues, if any:   Denies any pain in her breast area.  SAFETY ISSUES: Prior radiation? Yes she had radiation when she was 14. She had radiation on her thyroid. Pacemaker/ICD? no Possible current pregnancy?no Is the patient on methotrexate? no  Current Complaints / other details:      Vitals:   08/29/23 1239  BP: (!) 144/77  Pulse: 60  Resp: 18  SpO2: 100%  Weight: 90 kg

## 2023-09-01 DIAGNOSIS — D0512 Intraductal carcinoma in situ of left breast: Secondary | ICD-10-CM | POA: Diagnosis not present

## 2023-09-01 DIAGNOSIS — Z510 Encounter for antineoplastic radiation therapy: Secondary | ICD-10-CM | POA: Diagnosis not present

## 2023-09-01 DIAGNOSIS — Z170 Estrogen receptor positive status [ER+]: Secondary | ICD-10-CM | POA: Diagnosis not present

## 2023-09-01 DIAGNOSIS — C50412 Malignant neoplasm of upper-outer quadrant of left female breast: Secondary | ICD-10-CM | POA: Diagnosis not present

## 2023-09-05 ENCOUNTER — Other Ambulatory Visit: Payer: Self-pay

## 2023-09-05 ENCOUNTER — Ambulatory Visit
Admission: RE | Admit: 2023-09-05 | Discharge: 2023-09-05 | Disposition: A | Discharge status: Home / Self Care | Payer: Medicare PPO | Source: Ambulatory Visit | Attending: Radiation Oncology | Admitting: Radiation Oncology

## 2023-09-05 ENCOUNTER — Encounter: Payer: Self-pay | Admitting: *Deleted

## 2023-09-05 DIAGNOSIS — Z170 Estrogen receptor positive status [ER+]: Secondary | ICD-10-CM | POA: Diagnosis not present

## 2023-09-05 DIAGNOSIS — Z510 Encounter for antineoplastic radiation therapy: Secondary | ICD-10-CM | POA: Diagnosis not present

## 2023-09-05 DIAGNOSIS — D0512 Intraductal carcinoma in situ of left breast: Secondary | ICD-10-CM

## 2023-09-05 DIAGNOSIS — C50412 Malignant neoplasm of upper-outer quadrant of left female breast: Secondary | ICD-10-CM | POA: Diagnosis not present

## 2023-09-05 LAB — RAD ONC ARIA SESSION SUMMARY
Course Elapsed Days: 0
Plan Fractions Treated to Date: 1
Plan Prescribed Dose Per Fraction: 2.66 Gy
Plan Total Fractions Prescribed: 16
Plan Total Prescribed Dose: 42.56 Gy
Reference Point Dosage Given to Date: 2.66 Gy
Reference Point Session Dosage Given: 2.66 Gy
Session Number: 1

## 2023-09-06 ENCOUNTER — Ambulatory Visit
Admission: RE | Admit: 2023-09-06 | Discharge: 2023-09-06 | Disposition: A | Discharge status: Home / Self Care | Payer: Medicare PPO | Source: Ambulatory Visit | Attending: Radiation Oncology | Admitting: Radiation Oncology

## 2023-09-06 ENCOUNTER — Other Ambulatory Visit: Payer: Self-pay

## 2023-09-06 DIAGNOSIS — D0512 Intraductal carcinoma in situ of left breast: Secondary | ICD-10-CM | POA: Diagnosis not present

## 2023-09-06 DIAGNOSIS — Z170 Estrogen receptor positive status [ER+]: Secondary | ICD-10-CM | POA: Diagnosis not present

## 2023-09-06 DIAGNOSIS — C50412 Malignant neoplasm of upper-outer quadrant of left female breast: Secondary | ICD-10-CM | POA: Diagnosis not present

## 2023-09-06 DIAGNOSIS — Z510 Encounter for antineoplastic radiation therapy: Secondary | ICD-10-CM | POA: Diagnosis not present

## 2023-09-06 LAB — RAD ONC ARIA SESSION SUMMARY
Course Elapsed Days: 1
Plan Fractions Treated to Date: 2
Plan Prescribed Dose Per Fraction: 2.66 Gy
Plan Total Fractions Prescribed: 16
Plan Total Prescribed Dose: 42.56 Gy
Reference Point Dosage Given to Date: 5.32 Gy
Reference Point Session Dosage Given: 2.66 Gy
Session Number: 2

## 2023-09-07 ENCOUNTER — Ambulatory Visit
Admission: RE | Admit: 2023-09-07 | Discharge: 2023-09-07 | Disposition: A | Discharge status: Home / Self Care | Payer: Medicare PPO | Source: Ambulatory Visit | Attending: Radiation Oncology | Admitting: Radiation Oncology

## 2023-09-07 ENCOUNTER — Other Ambulatory Visit: Payer: Self-pay

## 2023-09-07 DIAGNOSIS — C50412 Malignant neoplasm of upper-outer quadrant of left female breast: Secondary | ICD-10-CM | POA: Diagnosis not present

## 2023-09-07 DIAGNOSIS — Z170 Estrogen receptor positive status [ER+]: Secondary | ICD-10-CM | POA: Diagnosis not present

## 2023-09-07 DIAGNOSIS — D0512 Intraductal carcinoma in situ of left breast: Secondary | ICD-10-CM | POA: Diagnosis not present

## 2023-09-07 DIAGNOSIS — Z510 Encounter for antineoplastic radiation therapy: Secondary | ICD-10-CM | POA: Diagnosis not present

## 2023-09-07 LAB — RAD ONC ARIA SESSION SUMMARY
Course Elapsed Days: 2
Plan Fractions Treated to Date: 3
Plan Prescribed Dose Per Fraction: 2.66 Gy
Plan Total Fractions Prescribed: 16
Plan Total Prescribed Dose: 42.56 Gy
Reference Point Dosage Given to Date: 7.98 Gy
Reference Point Session Dosage Given: 2.66 Gy
Session Number: 3

## 2023-09-08 ENCOUNTER — Ambulatory Visit
Admission: RE | Admit: 2023-09-08 | Discharge: 2023-09-08 | Disposition: A | Discharge status: Home / Self Care | Payer: Medicare PPO | Source: Ambulatory Visit | Attending: Radiation Oncology | Admitting: Radiation Oncology

## 2023-09-08 ENCOUNTER — Other Ambulatory Visit: Payer: Self-pay

## 2023-09-08 ENCOUNTER — Other Ambulatory Visit: Payer: Self-pay | Admitting: Family Medicine

## 2023-09-08 DIAGNOSIS — D0512 Intraductal carcinoma in situ of left breast: Secondary | ICD-10-CM | POA: Diagnosis not present

## 2023-09-08 DIAGNOSIS — Z170 Estrogen receptor positive status [ER+]: Secondary | ICD-10-CM | POA: Diagnosis not present

## 2023-09-08 DIAGNOSIS — Z510 Encounter for antineoplastic radiation therapy: Secondary | ICD-10-CM | POA: Diagnosis not present

## 2023-09-08 LAB — RAD ONC ARIA SESSION SUMMARY
Course Elapsed Days: 3
Plan Fractions Treated to Date: 4
Plan Prescribed Dose Per Fraction: 2.66 Gy
Plan Total Fractions Prescribed: 16
Plan Total Prescribed Dose: 42.56 Gy
Reference Point Dosage Given to Date: 10.64 Gy
Reference Point Session Dosage Given: 2.66 Gy
Session Number: 4

## 2023-09-11 ENCOUNTER — Ambulatory Visit
Admission: RE | Admit: 2023-09-11 | Discharge: 2023-09-11 | Disposition: A | Discharge status: Home / Self Care | Payer: Medicare PPO | Source: Ambulatory Visit | Attending: Radiation Oncology | Admitting: Radiation Oncology

## 2023-09-11 ENCOUNTER — Ambulatory Visit
Admission: RE | Admit: 2023-09-11 | Discharge: 2023-09-11 | Discharge status: Home / Self Care | Payer: Medicare PPO | Source: Ambulatory Visit | Attending: Radiation Oncology

## 2023-09-11 ENCOUNTER — Other Ambulatory Visit: Payer: Self-pay

## 2023-09-11 DIAGNOSIS — C50412 Malignant neoplasm of upper-outer quadrant of left female breast: Secondary | ICD-10-CM | POA: Diagnosis not present

## 2023-09-11 DIAGNOSIS — D0512 Intraductal carcinoma in situ of left breast: Secondary | ICD-10-CM

## 2023-09-11 DIAGNOSIS — Z510 Encounter for antineoplastic radiation therapy: Secondary | ICD-10-CM | POA: Diagnosis not present

## 2023-09-11 DIAGNOSIS — Z170 Estrogen receptor positive status [ER+]: Secondary | ICD-10-CM | POA: Diagnosis not present

## 2023-09-11 LAB — RAD ONC ARIA SESSION SUMMARY
Course Elapsed Days: 6
Plan Fractions Treated to Date: 5
Plan Prescribed Dose Per Fraction: 2.66 Gy
Plan Total Fractions Prescribed: 16
Plan Total Prescribed Dose: 42.56 Gy
Reference Point Dosage Given to Date: 13.3 Gy
Reference Point Session Dosage Given: 2.66 Gy
Session Number: 5

## 2023-09-11 MED ORDER — RADIAPLEXRX EX GEL
Freq: Once | CUTANEOUS | Status: AC
Start: 2023-09-11 — End: 2023-09-11

## 2023-09-11 MED ORDER — ALRA NON-METALLIC DEODORANT (RAD-ONC)
1.0000 | Freq: Once | TOPICAL | Status: AC
  Administered 2023-09-11: 1 via TOPICAL

## 2023-09-12 ENCOUNTER — Ambulatory Visit
Admission: RE | Admit: 2023-09-12 | Discharge: 2023-09-12 | Disposition: A | Discharge status: Home / Self Care | Payer: Medicare PPO | Source: Ambulatory Visit | Attending: Radiation Oncology | Admitting: Radiation Oncology

## 2023-09-12 ENCOUNTER — Other Ambulatory Visit: Payer: Self-pay

## 2023-09-12 DIAGNOSIS — Z170 Estrogen receptor positive status [ER+]: Secondary | ICD-10-CM | POA: Diagnosis not present

## 2023-09-12 DIAGNOSIS — Z510 Encounter for antineoplastic radiation therapy: Secondary | ICD-10-CM | POA: Diagnosis not present

## 2023-09-12 DIAGNOSIS — C50412 Malignant neoplasm of upper-outer quadrant of left female breast: Secondary | ICD-10-CM | POA: Diagnosis not present

## 2023-09-12 DIAGNOSIS — D0512 Intraductal carcinoma in situ of left breast: Secondary | ICD-10-CM | POA: Diagnosis not present

## 2023-09-12 LAB — RAD ONC ARIA SESSION SUMMARY
Course Elapsed Days: 7
Plan Fractions Treated to Date: 6
Plan Prescribed Dose Per Fraction: 2.66 Gy
Plan Total Fractions Prescribed: 16
Plan Total Prescribed Dose: 42.56 Gy
Reference Point Dosage Given to Date: 15.96 Gy
Reference Point Session Dosage Given: 2.66 Gy
Session Number: 6

## 2023-09-14 ENCOUNTER — Ambulatory Visit
Admission: RE | Admit: 2023-09-14 | Discharge: 2023-09-14 | Disposition: A | Discharge status: Home / Self Care | Payer: Medicare PPO | Source: Ambulatory Visit | Attending: Radiation Oncology | Admitting: Radiation Oncology

## 2023-09-14 ENCOUNTER — Other Ambulatory Visit: Payer: Self-pay

## 2023-09-14 DIAGNOSIS — C50412 Malignant neoplasm of upper-outer quadrant of left female breast: Secondary | ICD-10-CM | POA: Diagnosis not present

## 2023-09-14 DIAGNOSIS — Z170 Estrogen receptor positive status [ER+]: Secondary | ICD-10-CM | POA: Diagnosis not present

## 2023-09-14 DIAGNOSIS — D0512 Intraductal carcinoma in situ of left breast: Secondary | ICD-10-CM | POA: Diagnosis not present

## 2023-09-14 DIAGNOSIS — Z510 Encounter for antineoplastic radiation therapy: Secondary | ICD-10-CM | POA: Diagnosis not present

## 2023-09-14 LAB — RAD ONC ARIA SESSION SUMMARY
Course Elapsed Days: 9
Plan Fractions Treated to Date: 7
Plan Prescribed Dose Per Fraction: 2.66 Gy
Plan Total Fractions Prescribed: 16
Plan Total Prescribed Dose: 42.56 Gy
Reference Point Dosage Given to Date: 18.62 Gy
Reference Point Session Dosage Given: 2.66 Gy
Session Number: 7

## 2023-09-15 ENCOUNTER — Ambulatory Visit
Admission: RE | Admit: 2023-09-15 | Discharge: 2023-09-15 | Disposition: A | Discharge status: Home / Self Care | Payer: Medicare PPO | Source: Ambulatory Visit | Attending: Radiation Oncology

## 2023-09-15 ENCOUNTER — Other Ambulatory Visit: Payer: Self-pay

## 2023-09-15 DIAGNOSIS — D0512 Intraductal carcinoma in situ of left breast: Secondary | ICD-10-CM | POA: Diagnosis not present

## 2023-09-15 DIAGNOSIS — Z510 Encounter for antineoplastic radiation therapy: Secondary | ICD-10-CM | POA: Diagnosis not present

## 2023-09-15 DIAGNOSIS — Z170 Estrogen receptor positive status [ER+]: Secondary | ICD-10-CM | POA: Diagnosis not present

## 2023-09-15 DIAGNOSIS — C50412 Malignant neoplasm of upper-outer quadrant of left female breast: Secondary | ICD-10-CM | POA: Diagnosis not present

## 2023-09-15 LAB — RAD ONC ARIA SESSION SUMMARY
Course Elapsed Days: 10
Plan Fractions Treated to Date: 8
Plan Prescribed Dose Per Fraction: 2.66 Gy
Plan Total Fractions Prescribed: 16
Plan Total Prescribed Dose: 42.56 Gy
Reference Point Dosage Given to Date: 21.28 Gy
Reference Point Session Dosage Given: 2.66 Gy
Session Number: 8

## 2023-09-16 ENCOUNTER — Other Ambulatory Visit: Payer: Self-pay | Admitting: Family Medicine

## 2023-09-18 ENCOUNTER — Ambulatory Visit
Admission: RE | Admit: 2023-09-18 | Discharge: 2023-09-18 | Disposition: A | Discharge status: Home / Self Care | Payer: Medicare PPO | Source: Ambulatory Visit | Attending: Radiation Oncology | Admitting: Radiation Oncology

## 2023-09-18 ENCOUNTER — Other Ambulatory Visit: Payer: Self-pay | Admitting: Family Medicine

## 2023-09-18 ENCOUNTER — Other Ambulatory Visit: Payer: Self-pay

## 2023-09-18 DIAGNOSIS — C50412 Malignant neoplasm of upper-outer quadrant of left female breast: Secondary | ICD-10-CM | POA: Diagnosis not present

## 2023-09-18 DIAGNOSIS — Z510 Encounter for antineoplastic radiation therapy: Secondary | ICD-10-CM | POA: Diagnosis not present

## 2023-09-18 DIAGNOSIS — D0512 Intraductal carcinoma in situ of left breast: Secondary | ICD-10-CM | POA: Diagnosis not present

## 2023-09-18 DIAGNOSIS — Z170 Estrogen receptor positive status [ER+]: Secondary | ICD-10-CM | POA: Diagnosis not present

## 2023-09-18 LAB — RAD ONC ARIA SESSION SUMMARY
Course Elapsed Days: 13
Plan Fractions Treated to Date: 9
Plan Prescribed Dose Per Fraction: 2.66 Gy
Plan Total Fractions Prescribed: 16
Plan Total Prescribed Dose: 42.56 Gy
Reference Point Dosage Given to Date: 23.94 Gy
Reference Point Session Dosage Given: 2.66 Gy
Session Number: 9

## 2023-09-19 ENCOUNTER — Other Ambulatory Visit: Payer: Self-pay | Admitting: Family Medicine

## 2023-09-19 ENCOUNTER — Ambulatory Visit
Admission: RE | Admit: 2023-09-19 | Discharge: 2023-09-19 | Disposition: A | Discharge status: Home / Self Care | Payer: Medicare PPO | Source: Ambulatory Visit | Attending: Radiation Oncology | Admitting: Radiation Oncology

## 2023-09-19 ENCOUNTER — Other Ambulatory Visit: Payer: Self-pay

## 2023-09-19 DIAGNOSIS — Z510 Encounter for antineoplastic radiation therapy: Secondary | ICD-10-CM | POA: Diagnosis not present

## 2023-09-19 DIAGNOSIS — C50412 Malignant neoplasm of upper-outer quadrant of left female breast: Secondary | ICD-10-CM | POA: Diagnosis not present

## 2023-09-19 DIAGNOSIS — Z170 Estrogen receptor positive status [ER+]: Secondary | ICD-10-CM | POA: Diagnosis not present

## 2023-09-19 DIAGNOSIS — D0512 Intraductal carcinoma in situ of left breast: Secondary | ICD-10-CM | POA: Diagnosis not present

## 2023-09-19 LAB — RAD ONC ARIA SESSION SUMMARY
Course Elapsed Days: 14
Plan Fractions Treated to Date: 10
Plan Prescribed Dose Per Fraction: 2.66 Gy
Plan Total Fractions Prescribed: 16
Plan Total Prescribed Dose: 42.56 Gy
Reference Point Dosage Given to Date: 26.6 Gy
Reference Point Session Dosage Given: 2.66 Gy
Session Number: 10

## 2023-09-19 NOTE — Telephone Encounter (Signed)
 Copied from CRM [id number]. Topic: Clinical - Medication Refill >> Sep 19, 2023  1:02 PM [facility] ORN wrote: Most Recent Primary Care Visit:  Provider: JOHNNY SENIOR A  Department: [facility]  Visit Type: OFFICE VISIT  Date: 06/06/2023  Medication: butalbital -acetaminophen -caffeine  (FIORICET ) 50-325-40 MG tablet  Has the patient contacted their pharmacy? Yes (Agent: If no, request that the patient contact the pharmacy for the refill. If patient does not wish to contact the pharmacy document the reason why and proceed with request.) (Agent: If yes, when and what did the pharmacy advise?)  Is this the correct pharmacy for this prescription? Yes If no, delete pharmacy and type the correct one.  This is the patient's preferred pharmacy:  [id number] - [place], [place] - [address] AT [facility] [address] [place] [address] Phone: [phone] Fax: [phone]   Has the prescription been filled recently? Yes  Is the patient out of the medication? No, but only has few tablets left   Has the patient been seen for an appointment in the last year OR does the patient have an upcoming appointment? Yes  Can we respond through MyChart? Yes  Agent: Please be advised that Rx refills may take up to 3 business days. We ask that you follow-up with your pharmacy.

## 2023-09-19 NOTE — Telephone Encounter (Signed)
Pt LOV was on 06/06/23 Last refill was done on 03/28/23 Please advise

## 2023-09-21 ENCOUNTER — Telehealth: Payer: Self-pay | Admitting: *Deleted

## 2023-09-21 ENCOUNTER — Ambulatory Visit
Admission: RE | Admit: 2023-09-21 | Discharge: 2023-09-21 | Disposition: A | Discharge status: Home / Self Care | Payer: Medicare PPO | Source: Ambulatory Visit | Attending: Radiation Oncology | Admitting: Radiation Oncology

## 2023-09-21 ENCOUNTER — Other Ambulatory Visit: Payer: Self-pay

## 2023-09-21 DIAGNOSIS — D0512 Intraductal carcinoma in situ of left breast: Secondary | ICD-10-CM | POA: Diagnosis not present

## 2023-09-21 DIAGNOSIS — Z510 Encounter for antineoplastic radiation therapy: Secondary | ICD-10-CM | POA: Diagnosis present

## 2023-09-21 DIAGNOSIS — Z170 Estrogen receptor positive status [ER+]: Secondary | ICD-10-CM | POA: Diagnosis not present

## 2023-09-21 DIAGNOSIS — C50412 Malignant neoplasm of upper-outer quadrant of left female breast: Secondary | ICD-10-CM | POA: Diagnosis not present

## 2023-09-21 LAB — RAD ONC ARIA SESSION SUMMARY
Course Elapsed Days: 16
Plan Fractions Treated to Date: 11
Plan Prescribed Dose Per Fraction: 2.66 Gy
Plan Total Fractions Prescribed: 16
Plan Total Prescribed Dose: 42.56 Gy
Reference Point Dosage Given to Date: 29.26 Gy
Reference Point Session Dosage Given: 2.66 Gy
Session Number: 11

## 2023-09-21 NOTE — Telephone Encounter (Signed)
 Copied from CRM [id number]. Topic: Clinical - Prescription Issue >> Sep 19, 2023 12:57 PM [facility] ORN wrote: Reason for CRM: pt already spoken with pharmacy having issues, butalbital -acetaminophen -caffeine  (FIORICET ) 50-325-40 MG tablet, chart shows discontinued 09/19/23

## 2023-09-22 ENCOUNTER — Other Ambulatory Visit: Payer: Self-pay

## 2023-09-22 ENCOUNTER — Telehealth: Payer: Self-pay | Admitting: *Deleted

## 2023-09-22 ENCOUNTER — Ambulatory Visit
Admission: RE | Admit: 2023-09-22 | Discharge: 2023-09-22 | Disposition: A | Discharge status: Home / Self Care | Payer: Medicare PPO | Source: Ambulatory Visit | Attending: Radiation Oncology | Admitting: Radiation Oncology

## 2023-09-22 DIAGNOSIS — Z170 Estrogen receptor positive status [ER+]: Secondary | ICD-10-CM | POA: Diagnosis not present

## 2023-09-22 DIAGNOSIS — C50412 Malignant neoplasm of upper-outer quadrant of left female breast: Secondary | ICD-10-CM | POA: Diagnosis not present

## 2023-09-22 DIAGNOSIS — D0512 Intraductal carcinoma in situ of left breast: Secondary | ICD-10-CM | POA: Diagnosis not present

## 2023-09-22 DIAGNOSIS — Z510 Encounter for antineoplastic radiation therapy: Secondary | ICD-10-CM | POA: Diagnosis not present

## 2023-09-22 LAB — RAD ONC ARIA SESSION SUMMARY
Course Elapsed Days: 17
Plan Fractions Treated to Date: 12
Plan Prescribed Dose Per Fraction: 2.66 Gy
Plan Total Fractions Prescribed: 16
Plan Total Prescribed Dose: 42.56 Gy
Reference Point Dosage Given to Date: 31.92 Gy
Reference Point Session Dosage Given: 2.66 Gy
Session Number: 12

## 2023-09-22 NOTE — Telephone Encounter (Signed)
 Copied from CRM [id number]. Topic: Clinical - Medication Refill >> Sep 19, 2023  1:02 PM [facility] ORN wrote: Most Recent Primary Care Visit:  Provider: JOHNNY SENIOR A  Department: [facility]  Visit Type: OFFICE VISIT  Date: 06/06/2023  Medication: butalbital -acetaminophen -caffeine  (FIORICET ) 50-325-40 MG tablet  Has the patient contacted their pharmacy? Yes (Agent: If no, request that the patient contact the pharmacy for the refill. If patient does not wish to contact the pharmacy document the reason why and proceed with request.) (Agent: If yes, when and what did the pharmacy advise?)  Is this the correct pharmacy for this prescription? Yes If no, delete pharmacy and type the correct one.  This is the patient's preferred pharmacy:  [id number] - [place], [place] - [address] AT [facility] [address] [place] [address] Phone: [phone] Fax: [phone]   Has the prescription been filled recently? Yes  Is the patient out of the medication? No, but only has few tablets left   Has the patient been seen for an appointment in the last year OR does the patient have an upcoming appointment? Yes  Can we respond through MyChart? Yes  Agent: Please be advised that Rx refills may take up to 3 business days. We ask that you follow-up with your pharmacy.

## 2023-09-22 NOTE — Telephone Encounter (Signed)
 This was NOT discontinued. I gave her #60 with 5 refills on 09-19-23

## 2023-09-22 NOTE — Telephone Encounter (Signed)
Message sent to Dr Fry for advise 

## 2023-09-25 ENCOUNTER — Ambulatory Visit
Admission: RE | Admit: 2023-09-25 | Discharge: 2023-09-25 | Disposition: A | Discharge status: Home / Self Care | Payer: Medicare PPO | Source: Ambulatory Visit | Attending: Radiation Oncology | Admitting: Radiation Oncology

## 2023-09-25 ENCOUNTER — Other Ambulatory Visit: Payer: Self-pay

## 2023-09-25 DIAGNOSIS — Z170 Estrogen receptor positive status [ER+]: Secondary | ICD-10-CM | POA: Diagnosis not present

## 2023-09-25 DIAGNOSIS — D0512 Intraductal carcinoma in situ of left breast: Secondary | ICD-10-CM | POA: Diagnosis not present

## 2023-09-25 DIAGNOSIS — Z510 Encounter for antineoplastic radiation therapy: Secondary | ICD-10-CM | POA: Diagnosis not present

## 2023-09-25 DIAGNOSIS — C50412 Malignant neoplasm of upper-outer quadrant of left female breast: Secondary | ICD-10-CM | POA: Diagnosis not present

## 2023-09-25 LAB — RAD ONC ARIA SESSION SUMMARY
Course Elapsed Days: 20
Plan Fractions Treated to Date: 13
Plan Prescribed Dose Per Fraction: 2.66 Gy
Plan Total Fractions Prescribed: 16
Plan Total Prescribed Dose: 42.56 Gy
Reference Point Dosage Given to Date: 34.58 Gy
Reference Point Session Dosage Given: 2.66 Gy
Session Number: 13

## 2023-09-26 ENCOUNTER — Other Ambulatory Visit: Payer: Self-pay

## 2023-09-26 ENCOUNTER — Ambulatory Visit
Admission: RE | Admit: 2023-09-26 | Discharge: 2023-09-26 | Disposition: A | Discharge status: Home / Self Care | Payer: Medicare PPO | Source: Ambulatory Visit | Attending: Radiation Oncology | Admitting: Radiation Oncology

## 2023-09-26 DIAGNOSIS — Z510 Encounter for antineoplastic radiation therapy: Secondary | ICD-10-CM | POA: Diagnosis not present

## 2023-09-26 DIAGNOSIS — C50412 Malignant neoplasm of upper-outer quadrant of left female breast: Secondary | ICD-10-CM | POA: Diagnosis not present

## 2023-09-26 DIAGNOSIS — Z170 Estrogen receptor positive status [ER+]: Secondary | ICD-10-CM | POA: Diagnosis not present

## 2023-09-26 DIAGNOSIS — D0512 Intraductal carcinoma in situ of left breast: Secondary | ICD-10-CM | POA: Diagnosis not present

## 2023-09-26 LAB — RAD ONC ARIA SESSION SUMMARY
Course Elapsed Days: 21
Plan Fractions Treated to Date: 14
Plan Prescribed Dose Per Fraction: 2.66 Gy
Plan Total Fractions Prescribed: 16
Plan Total Prescribed Dose: 42.56 Gy
Reference Point Dosage Given to Date: 37.24 Gy
Reference Point Session Dosage Given: 2.66 Gy
Session Number: 14

## 2023-09-27 ENCOUNTER — Ambulatory Visit
Admission: RE | Admit: 2023-09-27 | Discharge: 2023-09-27 | Disposition: A | Discharge status: Home / Self Care | Payer: Medicare PPO | Source: Ambulatory Visit | Attending: Radiation Oncology | Admitting: Radiation Oncology

## 2023-09-27 ENCOUNTER — Other Ambulatory Visit: Payer: Self-pay

## 2023-09-27 DIAGNOSIS — Z170 Estrogen receptor positive status [ER+]: Secondary | ICD-10-CM | POA: Diagnosis not present

## 2023-09-27 DIAGNOSIS — Z510 Encounter for antineoplastic radiation therapy: Secondary | ICD-10-CM | POA: Diagnosis not present

## 2023-09-27 DIAGNOSIS — D0512 Intraductal carcinoma in situ of left breast: Secondary | ICD-10-CM | POA: Diagnosis not present

## 2023-09-27 DIAGNOSIS — C50412 Malignant neoplasm of upper-outer quadrant of left female breast: Secondary | ICD-10-CM | POA: Diagnosis not present

## 2023-09-27 LAB — RAD ONC ARIA SESSION SUMMARY
Course Elapsed Days: 22
Plan Fractions Treated to Date: 15
Plan Prescribed Dose Per Fraction: 2.66 Gy
Plan Total Fractions Prescribed: 16
Plan Total Prescribed Dose: 42.56 Gy
Reference Point Dosage Given to Date: 39.9 Gy
Reference Point Session Dosage Given: 2.66 Gy
Session Number: 15

## 2023-09-28 ENCOUNTER — Other Ambulatory Visit: Payer: Self-pay

## 2023-09-28 ENCOUNTER — Ambulatory Visit
Admission: RE | Admit: 2023-09-28 | Discharge: 2023-09-28 | Disposition: A | Discharge status: Home / Self Care | Payer: Medicare PPO | Source: Ambulatory Visit | Attending: Radiation Oncology | Admitting: Radiation Oncology

## 2023-09-28 ENCOUNTER — Inpatient Hospital Stay: Payer: Medicare PPO | Attending: Hematology | Admitting: Hematology

## 2023-09-28 DIAGNOSIS — Z170 Estrogen receptor positive status [ER+]: Secondary | ICD-10-CM | POA: Diagnosis not present

## 2023-09-28 DIAGNOSIS — Z510 Encounter for antineoplastic radiation therapy: Secondary | ICD-10-CM | POA: Diagnosis not present

## 2023-09-28 DIAGNOSIS — C50412 Malignant neoplasm of upper-outer quadrant of left female breast: Secondary | ICD-10-CM | POA: Diagnosis not present

## 2023-09-28 DIAGNOSIS — D0512 Intraductal carcinoma in situ of left breast: Secondary | ICD-10-CM | POA: Diagnosis not present

## 2023-09-28 LAB — RAD ONC ARIA SESSION SUMMARY
Course Elapsed Days: 23
Plan Fractions Treated to Date: 16
Plan Prescribed Dose Per Fraction: 2.66 Gy
Plan Total Fractions Prescribed: 16
Plan Total Prescribed Dose: 42.56 Gy
Reference Point Dosage Given to Date: 42.56 Gy
Reference Point Session Dosage Given: 2.66 Gy
Session Number: 16

## 2023-09-28 NOTE — Assessment & Plan Note (Deleted)
-  Diagnosed in October 2024 -Status post lumpectomy in November 2024, surgical path reviewed, margins were negative. -She started adjuvant radiation on September 05, 2023 -I reviewed the role of antiestrogen therapy.  Given her advanced age, and she already received adjuvant radiation, I think it is reasonable to forego antiestrogen therapy.

## 2023-09-28 NOTE — Telephone Encounter (Signed)
 Spoke with pt states that she picked up Rx from her pharmacy

## 2023-09-29 NOTE — Radiation Completion Notes (Signed)
 Patient Name: Abigail [name], Abigail [name] MRN: [id number] Date of Birth: 06/03/1946 Referring Physician: GARNETTE OLMSTED, M.D. Date of Service: 2023-09-29 Radiation Oncologist: Lauraine Golden, M.D. [facility] -                              RADIATION ONCOLOGY END OF TREATMENT NOTE     Diagnosis: D05.12 Intraductal carcinoma in situ of left breast Staging on 2023-08-02: Ductal carcinoma in situ (DCIS) of left breast T=pTis (DCIS), N=pNX, M=cM0 Staging on 2023-07-05: Ductal carcinoma in situ (DCIS) of left breast T=cTis (DCIS), N=cN0, M=cM0 Intent: Curative     ==========DELIVERED PLANS==========  First Treatment Date: 2023-09-05 Last Treatment Date: 2023-09-28   Plan Name: Breast_L_BH Site: Breast, Left Technique: 3D Mode: Photon Dose Per Fraction: 2.66 Gy Prescribed Dose (Delivered / Prescribed): 42.56 Gy / 42.56 Gy Prescribed Fxs (Delivered / Prescribed): 16 / 16     ==========ON TREATMENT VISIT DATES========== 2023-09-11, 2023-09-15, 2023-09-25     ==========UPCOMING VISITS========== 11/02/2023 [facility] ON[facility] FOLLOW UP 20 Golden Lauraine, MD  10/19/2023 [facility] VIDEO VISIT Luke Chiquita SAUNDERS, DO        ==========APPENDIX - ON TREATMENT VISIT NOTES==========   See weekly On Treatment Notes in Epic for details in the Media tab (listed as Progress notes on the On Treatment Visit Dates listed above).

## 2023-09-30 ENCOUNTER — Other Ambulatory Visit: Payer: Self-pay | Admitting: Family Medicine

## 2023-09-30 DIAGNOSIS — E7800 Pure hypercholesterolemia, unspecified: Secondary | ICD-10-CM

## 2023-10-19 ENCOUNTER — Encounter: Payer: Medicare PPO | Admitting: Family Medicine

## 2023-10-19 NOTE — Progress Notes (Signed)
error

## 2023-10-22 ENCOUNTER — Other Ambulatory Visit: Payer: Self-pay | Admitting: Cardiology

## 2023-10-22 DIAGNOSIS — I10 Essential (primary) hypertension: Secondary | ICD-10-CM

## 2023-10-26 ENCOUNTER — Telehealth: Payer: Self-pay | Admitting: Neurology

## 2023-10-26 NOTE — Telephone Encounter (Signed)
 Pt called to schedule her 1 yr f/u

## 2023-10-26 NOTE — Progress Notes (Addendum)
  Abigail [name] presents today for follow-up after completing radiation to her left breast.  Pain: Patient states she has 8 to 9 Intermittent sharp pain of the breast for a couple of seconds. Patient says she has a follow up appointment in April with her surgeon. Skin: Patient had peeling and discoloration of the left breast. She uses vitamin E oil and says it has cleared up the discoloration. ROM: Patient denies any problem with range of motion. Lymphedema: Patient denies having any swelling of the extremities MedOnc F/U: N/A Other issues of note: None  Pt reports Yes No Comments  Tamoxifen []  [x]    Letrozole []  [x]    Anastrazole []  [x]    Mammogram [x]  Date: October 2024 []     Patient says she is healing well after surgery.  Encouraged patient to come to all their follow up appointments and mammograms. Patient was worried and voiced concerns that her hair is thinning, reassured patient that radiation only causes hair loss in the treatment field. Encouraged patient to follow up with their primary care provider

## 2023-11-02 ENCOUNTER — Ambulatory Visit
Admission: RE | Admit: 2023-11-02 | Discharge: 2023-11-02 | Disposition: A | Discharge status: Home / Self Care | Payer: Medicare PPO | Source: Ambulatory Visit | Attending: Radiation Oncology | Admitting: Radiation Oncology

## 2023-11-02 DIAGNOSIS — D0512 Intraductal carcinoma in situ of left breast: Secondary | ICD-10-CM

## 2023-11-21 ENCOUNTER — Encounter: Payer: Self-pay | Admitting: Family Medicine

## 2023-11-21 ENCOUNTER — Telehealth (INDEPENDENT_AMBULATORY_CARE_PROVIDER_SITE_OTHER): Admitting: Family Medicine

## 2023-11-21 VITALS — Wt 192.0 lb

## 2023-11-21 DIAGNOSIS — K5732 Diverticulitis of large intestine without perforation or abscess without bleeding: Secondary | ICD-10-CM

## 2023-11-21 MED ORDER — DIPHENOXYLATE-ATROPINE 2.5-0.025 MG PO TABS: 2.0000 | ORAL_TABLET | Freq: Four times a day (QID) | ORAL | 0 refills | Status: DC | PRN

## 2023-11-21 MED ORDER — METRONIDAZOLE 500 MG PO TABS: 500.0000 mg | ORAL_TABLET | Freq: Three times a day (TID) | ORAL | 0 refills | Status: DC

## 2023-11-21 MED ORDER — CIPROFLOXACIN HCL 500 MG PO TABS
500.0000 mg | ORAL_TABLET | Freq: Two times a day (BID) | ORAL | 0 refills | Status: AC
Start: 2023-11-21 — End: 2023-12-01

## 2023-11-21 NOTE — Progress Notes (Signed)
 Subjective:    Patient ID: Abigail [name], female    DOB: 1946/02/27, 78 y.o.   MRN: [id number]  HPI Virtual Visit via Video Note  I connected with the patient on 11/21/23 at 11:00 AM EST by a video enabled telemedicine application and verified that I am speaking with the correct person using two identifiers.  Location patient: home Location provider:work or home office Persons participating in the virtual visit: patient, provider  I discussed the limitations of evaluation and management by telemedicine and the availability of in person appointments. The patient expressed understanding and agreed to proceed.   HPI: Here for what she thinks is another episode of diverticulitis. 3 days ago she developed lower abdominal cramping pains that are more pronounced on the left side. She has had low grade fevers. She has liquid diarrhea multiple times a day. She vomited once yesterday, but she has had no nausea since then. She is afraid to eat food, but she has been drinking fluids, mostly water or ginger ale. No urinary symptoms.    ROS: See pertinent positives and negatives per HPI.  Past Medical History:  Diagnosis Date   Breast cancer (HCC)    Bronchitis 09/2016   Bruises easily    DDD (degenerative disc disease)    Degenerative joint disease of cervical spine 09/05/2011   Cervival area, now some osteoarthritis-lower back and Rt. shoulder   Depression    Diverticulitis 09/05/2011   hx. gastritis, diverticulitis x2 -now surgery planned   DIVERTICULITIS, ACUTE 02/10/2010   Dyspnea    with exertion   Essential hypertension 09/05/2011   tx. Verapamil   GROIN PAIN 09/21/2010   Headache(784.0)    headaches are better   Hearing loss    right ear   Heart palpitations    Hemorrhoids    History of bronchitis    History of colonic polyps    History of pneumonia    History of tension headache    Hyperlipidemia    Hypertension    Hypothyroidism 09/05/2011   Supplement used   Late  effect of adverse effect of drug, medicinal or biological substance    NECK PAIN, ACUTE 09/21/2010   Neuromuscular disorder (HCC)    carpal tunnel in both hands   Nocturia    Osteoarthritis 112-17-12   spine and rt. hip, rt. shoulder   Patent foramen ovale    Small - unable to be closed -    Pneumonia    PONV (postoperative nausea and vomiting)    Sleep apnea 09/05/2011   no cpap ever, had surgery to remove cartilage, no problems now   SORE THROAT 04/30/2009   Stroke (HCC) 09/05/2011   2006/2009-(loss of memory, balance issues remains occ.)   TIA 09/28/2007, 10/2014   Unstable angina (HCC)    a. 05/2010 Cath: nl cors, EF 55%;  b. 05/2015 Lexiscan MV: small, severe, fixed apical defect and a small, severe, reversible inf lateral defect w/ apical thinning and mild ischemia, EF 54%.   URI 09/02/2008   Vertigo     Past Surgical History:  Procedure Laterality Date   ABDOMINAL HYSTERECTOMY     ANTERIOR LAT LUMBAR FUSION Left 04/05/2023   Procedure: LEFT-SIDED LUMBAR 2- LUMBAR 3, LUMBAR 3- LUMBAR 4 LATERAL INTERBODY FUSION WITH INSTRUMENTATION AND ALLOGRAFT;  Surgeon: Estill Bamberg, MD;  Location: [facility];  Service: Orthopedics;  Laterality: Left;   BACK SURGERY     BREAST BIOPSY Left 06/29/2023   MM LT BREAST BX W LOC DEV  1ST LESION IMAGE BX SPEC STEREO GUIDE 06/29/2023 [facility]   BREAST BIOPSY Left 08/02/2023   MM LT PLC BREAST LOC DEV   1ST LESION  INC MAMMO GUIDE 08/02/2023 [facility]   BREAST LUMPECTOMY WITH RADIOACTIVE SEED LOCALIZATION Left 08/02/2023   Procedure: WIRE LOCALIZED LEFT BREAST LUMPECTOMY;  Surgeon: Almond Lint, MD;  Location: [facility];  Service: General;  Laterality: Left;  60 MINUTES   CARDIAC CATHETERIZATION N/A 06/01/2015   Procedure: Left Heart Cath and Coronary Angiography;  Surgeon: Kathleene Hazel, MD;  Location: [facility] INVASIVE CV LAB;  Service: Cardiovascular;  Laterality: N/A;   COLON RESECTION  09/07/2011   Procedure:  LAPAROSCOPIC SIGMOID COLON RESECTION;  Surgeon: Mariella Saa, MD;  Location: [facility];  Service: General;  Laterality: N/A;  with proctoscopy   COLONOSCOPY  08/06/2015   per Dr. Kinnie Scales, adenomatous polyps, repeat in 5 yrs    CYST EXCISION N/A 11/29/2016   Procedure: EXCISION UMBILICAL CYST;  Surgeon: Glenna Fellows, MD;  Location: [facility];  Service: General;  Laterality: N/A;   ELBOW SURGERY  09-05-11   left elbow -ligament repair   EYE SURGERY     cataract   INSERTION OF MESH N/A 12/13/2013   Procedure: INSERTION OF MESH;  Surgeon: Mariella Saa, MD;  Location: [facility];  Service: General;  Laterality: N/A;   LAPAROSCOPY  09/07/2011   Procedure: LAPAROSCOPY DIAGNOSTIC;  Surgeon: Roselle Locus II;  Location: [facility];  Service: Gynecology;  Laterality: N/A;   MAXIMUM ACCESS (MAS)POSTERIOR LUMBAR INTERBODY FUSION (PLIF) 2 LEVEL N/A 05/03/2016   Procedure: L4-5 L5-S1 Maximum access posterior lumbar interbody fusion;  Surgeon: Maeola Harman, MD;  Location: [facility];  Service: Neurosurgery;  Laterality: N/A;  L4-5 L5-S1 Maximum access posterior lumbar interbody fusion   SALPINGOOPHORECTOMY  09/07/2011   Procedure: SALPINGO OOPHERECTOMY;  Surgeon: Roselle Locus II;  Location: [facility];  Service: Gynecology;  Laterality: Right;   THYROIDECTOMY, PARTIAL  09-05-11   TOTAL HIP ARTHROPLASTY Right 08/21/2018   Procedure: TOTAL HIP ARTHROPLASTY ANTERIOR APPROACH;  Surgeon: Marcene Corning, MD;  Location: [facility];  Service: Orthopedics;  Laterality: Right;   TUBAL LIGATION  1983   UMBILICAL HERNIA REPAIR  yrs ago   VENTRAL HERNIA REPAIR  06/20/2012   Procedure: LAPAROSCOPIC VENTRAL HERNIA;  Surgeon: Mariella Saa, MD;  Location: [facility];  Service: General;  Laterality: N/A;  Laparoscopic Repair of Ventral Hernia with mesh   VENTRAL HERNIA REPAIR N/A 12/13/2013   Procedure: LAPAROSCOPIC REPAIR RECURRENT VENTRAL INCISIONAL  HERNIA;  Surgeon: Mariella Saa, MD;  Location: [facility];  Service:  General;  Laterality: N/A;    Family History  Problem Relation Age of Onset   Arrhythmia Mother    Hypertension Mother    Stroke Mother    Prostate cancer Father 83 - 84       metastatic   Breast cancer Sister        65s   Breast cancer Sister        60s   Stroke Maternal Grandmother    Diabetes Paternal Grandmother    Brain cancer Cousin        dx. >50     Current Outpatient Medications:    amLODipine (NORVASC) 10 MG tablet, TAKE 1 TABLET BY MOUTH EVERY DAY, Disp: 90 tablet, Rfl: 1   atorvastatin (LIPITOR) 40 MG tablet, TAKE 1 TABLET BY MOUTH EVERY DAY, Disp: 30 tablet, Rfl: 1   butalbital-acetaminophen-caffeine (FIORICET) 50-325-40 MG  tablet, TAKE 1 TABLET BY MOUTH EVERY 6 HOURS AS NEEDED FOR HEADACHE, Disp: 60 tablet, Rfl: 5   clopidogrel (PLAVIX) 75 MG tablet, TAKE 1 TABLET (75 MG TOTAL) BY MOUTH DAILY. *LABS REQUIRED FOR FUTURE REFILLS*, Disp: 90 tablet, Rfl: 1   ketoconazole (NIZORAL) 2 % cream, Apply 1 Application topically 2 (two) times daily., Disp: 30 g, Rfl: 2   levothyroxine (SYNTHROID) 125 MCG tablet, TAKE 1 TABLET BY MOUTH EVERY DAY BEFORE BREAKFAST, Disp: 90 tablet, Rfl: 1   losartan (COZAAR) 100 MG tablet, TAKE 1 TABLET BY MOUTH EVERY DAY, Disp: 90 tablet, Rfl: 2   metoprolol succinate (TOPROL-XL) 25 MG 24 hr tablet, TAKE 1 TABLET BY MOUTH EVERY DAY. (NEED APPT FOR REFILLS), Disp: 90 tablet, Rfl: 1   potassium chloride (KLOR-CON 10) 10 MEQ tablet, Take 1 tablet (10 mEq total) by mouth 2 (two) times daily. Take 2 tab twice daily for 5 days, Disp: 20 tablet, Rfl: 0   potassium chloride (KLOR-CON M) 10 MEQ tablet, Take 2 tablets twice a day, Disp: 360 tablet, Rfl: 3   traMADol (ULTRAM) 50 MG tablet, TAKE 1 TO 2 TABLETS BY MOUTH EVERY 4 TO 6 HOURS AS NEEDED FOR PAIN, Disp: 120 tablet, Rfl: 5  EXAM:  VITALS per patient if applicable:  GENERAL: alert, oriented, appears well and in no acute distress  HEENT: atraumatic, conjunttiva clear, no obvious abnormalities on  inspection of external nose and ears  NECK: normal movements of the head and neck  LUNGS: on inspection no signs of respiratory distress, breathing rate appears normal, no obvious gross SOB, gasping or wheezing  CV: no obvious cyanosis  MS: moves all visible extremities without noticeable abnormality  PSYCH/NEURO: pleasant and cooperative, no obvious depression or anxiety, speech and thought processing grossly intact  ASSESSMENT AND PLAN: Diverticulitis, treat with 10 days of Flagyl and Cipro. Use Lomotil as needed for diarrhea. Gershon Crane, MD  Discussed the following assessment and plan:  No diagnosis found.     I discussed the assessment and treatment plan with the patient. The patient was provided an opportunity to ask questions and all were answered. The patient agreed with the plan and demonstrated an understanding of the instructions.   The patient was advised to call back or seek an in-person evaluation if the symptoms worsen or if the condition fails to improve as anticipated.      Review of Systems     Objective:   Physical Exam        Assessment & Plan:

## 2023-12-03 ENCOUNTER — Other Ambulatory Visit: Payer: Self-pay | Admitting: Family Medicine

## 2023-12-03 DIAGNOSIS — E7800 Pure hypercholesterolemia, unspecified: Secondary | ICD-10-CM

## 2023-12-12 ENCOUNTER — Encounter: Payer: Self-pay | Admitting: Adult Health

## 2023-12-12 ENCOUNTER — Ambulatory Visit: Admitting: Adult Health

## 2023-12-12 VITALS — BP 122/80 | HR 67 | Temp 98.5°F | Ht 65.5 in | Wt 191.0 lb

## 2023-12-12 DIAGNOSIS — R197 Diarrhea, unspecified: Secondary | ICD-10-CM | POA: Diagnosis not present

## 2023-12-12 DIAGNOSIS — K5732 Diverticulitis of large intestine without perforation or abscess without bleeding: Secondary | ICD-10-CM | POA: Diagnosis not present

## 2023-12-12 DIAGNOSIS — R1084 Generalized abdominal pain: Secondary | ICD-10-CM | POA: Diagnosis not present

## 2023-12-12 LAB — COMPREHENSIVE METABOLIC PANEL
ALT: 11 U/L (ref 0–35)
AST: 14 U/L (ref 0–37)
Albumin: 4.1 g/dL (ref 3.5–5.2)
Alkaline Phosphatase: 116 U/L (ref 39–117)
BUN: 11 mg/dL (ref 6–23)
CO2: 25 meq/L (ref 19–32)
Calcium: 9.5 mg/dL (ref 8.4–10.5)
Chloride: 109 meq/L (ref 96–112)
Creatinine, Ser: 0.75 mg/dL (ref 0.40–1.20)
GFR: 76.14 mL/min (ref >60.00)
Glucose, Bld: 119 mg/dL — ABNORMAL HIGH (ref 70–99)
Potassium: 2.9 meq/L — ABNORMAL LOW (ref 3.5–5.1)
Sodium: 143 meq/L (ref 135–145)
Total Bilirubin: 0.4 mg/dL (ref 0.2–1.2)
Total Protein: 7.2 g/dL (ref 6.0–8.3)

## 2023-12-12 LAB — CBC
HCT: 40.1 % (ref 36.0–46.0)
Hemoglobin: 13.7 g/dL (ref 12.0–15.0)
MCHC: 34 g/dL (ref 30.0–36.0)
MCV: 88.9 fl (ref 78.0–100.0)
Platelets: 144 10*3/uL — ABNORMAL LOW (ref 150.0–400.0)
RBC: 4.51 Mil/uL (ref 3.87–5.11)
RDW: 13.7 % (ref 11.5–15.5)
WBC: 7.4 10*3/uL (ref 4.0–10.5)

## 2023-12-12 MED ORDER — ONDANSETRON HCL 4 MG PO TABS: 4.0000 mg | ORAL_TABLET | Freq: Three times a day (TID) | ORAL | 0 refills | Status: DC | PRN

## 2023-12-12 MED ORDER — AMOXICILLIN-POT CLAVULANATE 875-125 MG PO TABS: 1.0000 | ORAL_TABLET | Freq: Two times a day (BID) | ORAL | 0 refills | Status: DC

## 2023-12-12 NOTE — Progress Notes (Signed)
 Subjective:    Patient ID: Abigail [name], female    DOB: [birth year], 78 y.o.   MRN: [id number]  Diarrhea    78 year old female who  has a past medical history of Breast cancer (HCC), Bronchitis (09/2016), Bruises easily, DDD (degenerative disc disease), Degenerative joint disease of cervical spine (09/05/2011), Depression, Diverticulitis (09/05/2011), DIVERTICULITIS, ACUTE (02/10/2010), Dyspnea, Essential hypertension (09/05/2011), GROIN PAIN (09/21/2010), Headache(784.0), Hearing loss, Heart palpitations, Hemorrhoids, History of bronchitis, History of colonic polyps, History of pneumonia, History of tension headache, Hyperlipidemia, Hypertension, Hypothyroidism (09/05/2011), Late effect of adverse effect of drug, medicinal or biological substance, NECK PAIN, ACUTE (09/21/2010), Neuromuscular disorder (HCC), Nocturia, Osteoarthritis (112-17-12), Patent foramen ovale, Pneumonia, PONV (postoperative nausea and vomiting), Sleep apnea (09/05/2011), SORE THROAT (04/30/2009), Stroke (HCC) (09/05/2011), TIA (09/28/2007, 10/2014), Unstable angina (HCC), URI (09/02/2008), and Vertigo.  She is a patient of Dr. Clent Ridges who I am seeing today for an acute visit of diarrhea. She reports having diarrhea for the last three weeks. She was seen by her PCP earlier this month when her symptoms started. At this time she was having more pronounced left sided pain and low grade fevers. She has a history of diverticulitis and was felt that her symptoms were similar in nature. She was prescribed a course of cipro and flagyl as well as lomotil.   Today she reports that she continues to have diarrhea but it is not as watery " I am about 70% better". Continues to have abdominal pain as well. She did not take her full dose of antibiotics because it made her vomit. Last dose was 3 days ago. She is no longer having low grade fevers. She has not noticed any blood in stool. Noticed some formed stool over the weekend.   Wt Readings from Last  3 Encounters:  12/12/23 191 lb (86.6 kg)  11/21/23 192 lb (87.1 kg)  08/29/23 198 lb 6 oz (90 kg)   Review of Systems  Gastrointestinal:  Positive for diarrhea.   See HPI   Past Medical History:  Diagnosis Date   Breast cancer (HCC)    Bronchitis 09/2016   Bruises easily    DDD (degenerative disc disease)    Degenerative joint disease of cervical spine 09/05/2011   Cervival area, now some osteoarthritis-lower back and Rt. shoulder   Depression    Diverticulitis 09/05/2011   hx. gastritis, diverticulitis x2 -now surgery planned   DIVERTICULITIS, ACUTE 02/10/2010   Dyspnea    with exertion   Essential hypertension 09/05/2011   tx. Verapamil   GROIN PAIN 09/21/2010   Headache(784.0)    headaches are better   Hearing loss    right ear   Heart palpitations    Hemorrhoids    History of bronchitis    History of colonic polyps    History of pneumonia    History of tension headache    Hyperlipidemia    Hypertension    Hypothyroidism 09/05/2011   Supplement used   Late effect of adverse effect of drug, medicinal or biological substance    NECK PAIN, ACUTE 09/21/2010   Neuromuscular disorder (HCC)    carpal tunnel in both hands   Nocturia    Osteoarthritis 112-17-12   spine and rt. hip, rt. shoulder   Patent foramen ovale    Small - unable to be closed -    Pneumonia    PONV (postoperative nausea and vomiting)    Sleep apnea 09/05/2011   no cpap ever, had surgery to remove cartilage,  no problems now   SORE THROAT 04/30/2009   Stroke (HCC) 09/05/2011   2006/2009-(loss of memory, balance issues remains occ.)   TIA 09/28/2007, 10/2014   Unstable angina (HCC)    a. 05/2010 Cath: nl cors, EF 55%;  b. 05/2015 Lexiscan MV: small, severe, fixed apical defect and a small, severe, reversible inf lateral defect w/ apical thinning and mild ischemia, EF 54%.   URI 09/02/2008   Vertigo     Social History   Socioeconomic History   Marital status: Married    Spouse name: Not on  file   Number of children: 3   Years of education: Not on file   Highest education level: Not on file  Occupational History   Not on file  Tobacco Use   Smoking status: Former    Current packs/day: 0.00    Average packs/day: 0.5 packs/day for 5.0 years (2.5 ttl pk-yrs)    Types: Cigarettes    Start date: 09/20/1963    Quit date: 09/19/1968    Years since quitting: 55.2   Smokeless tobacco: Never  Vaping Use   Vaping status: Never Used  Substance and Sexual Activity   Alcohol use: Yes    Alcohol/week: 2.0 standard drinks of alcohol    Types: 1 Cans of beer, 1 Shots of liquor per week    Comment: rarely   Drug use: No   Sexual activity: Yes  Other Topics Concern   Not on file  Social History Narrative   Lives in [place] with her husband.  She does not routinely exercise.   Social Drivers of Corporate investment banker Strain: Not on file  Food Insecurity: No Food Insecurity (08/29/2023)   Hunger Vital Sign    Worried About Running Out of Food in the Last Year: Never true    Ran Out of Food in the Last Year: Never true  Transportation Needs: No Transportation Needs (08/29/2023)   PRAPARE - Administrator, Civil Service (Medical): No    Lack of Transportation (Non-Medical): No  Physical Activity: Not on file  Stress: Not on file  Social Connections: Not on file  Intimate Partner Violence: Not At Risk (08/29/2023)   Humiliation, Afraid, Rape, and Kick questionnaire    Fear of Current or Ex-Partner: No    Emotionally Abused: No    Physically Abused: No    Sexually Abused: No    Past Surgical History:  Procedure Laterality Date   ABDOMINAL HYSTERECTOMY     ANTERIOR LAT LUMBAR FUSION Left 04/05/2023   Procedure: LEFT-SIDED LUMBAR 2- LUMBAR 3, LUMBAR 3- LUMBAR 4 LATERAL INTERBODY FUSION WITH INSTRUMENTATION AND ALLOGRAFT;  Surgeon: Estill Bamberg, MD;  Location: [facility];  Service: Orthopedics;  Laterality: Left;   BACK SURGERY     BREAST BIOPSY Left 06/29/2023   MM  LT BREAST BX W LOC DEV 1ST LESION IMAGE BX SPEC STEREO GUIDE 06/29/2023 [facility]   BREAST BIOPSY Left 08/02/2023   MM LT PLC BREAST LOC DEV   1ST LESION  INC MAMMO GUIDE 08/02/2023 [facility]   BREAST LUMPECTOMY WITH RADIOACTIVE SEED LOCALIZATION Left 08/02/2023   Procedure: WIRE LOCALIZED LEFT BREAST LUMPECTOMY;  Surgeon: Almond Lint, MD;  Location: [facility];  Service: General;  Laterality: Left;  60 MINUTES   CARDIAC CATHETERIZATION N/A 06/01/2015   Procedure: Left Heart Cath and Coronary Angiography;  Surgeon: Kathleene Hazel, MD;  Location: [facility] INVASIVE CV LAB;  Service: Cardiovascular;  Laterality: N/A;   COLON  RESECTION  09/07/2011   Procedure: LAPAROSCOPIC SIGMOID COLON RESECTION;  Surgeon: Mariella Saa, MD;  Location: [facility];  Service: General;  Laterality: N/A;  with proctoscopy   COLONOSCOPY  08/06/2015   per Dr. Kinnie Scales, adenomatous polyps, repeat in 5 yrs    CYST EXCISION N/A 11/29/2016   Procedure: EXCISION UMBILICAL CYST;  Surgeon: Glenna Fellows, MD;  Location: [facility];  Service: General;  Laterality: N/A;   ELBOW SURGERY  09-05-11   left elbow -ligament repair   EYE SURGERY     cataract   INSERTION OF MESH N/A 12/13/2013   Procedure: INSERTION OF MESH;  Surgeon: Mariella Saa, MD;  Location: [facility];  Service: General;  Laterality: N/A;   LAPAROSCOPY  09/07/2011   Procedure: LAPAROSCOPY DIAGNOSTIC;  Surgeon: Roselle Locus II;  Location: [facility];  Service: Gynecology;  Laterality: N/A;   MAXIMUM ACCESS (MAS)POSTERIOR LUMBAR INTERBODY FUSION (PLIF) 2 LEVEL N/A 05/03/2016   Procedure: L4-5 L5-S1 Maximum access posterior lumbar interbody fusion;  Surgeon: Maeola Harman, MD;  Location: [facility];  Service: Neurosurgery;  Laterality: N/A;  L4-5 L5-S1 Maximum access posterior lumbar interbody fusion   SALPINGOOPHORECTOMY  09/07/2011   Procedure: SALPINGO OOPHERECTOMY;  Surgeon: Roselle Locus II;  Location: [facility];  Service:  Gynecology;  Laterality: Right;   THYROIDECTOMY, PARTIAL  09-05-11   TOTAL HIP ARTHROPLASTY Right 08/21/2018   Procedure: TOTAL HIP ARTHROPLASTY ANTERIOR APPROACH;  Surgeon: Marcene Corning, MD;  Location: [facility];  Service: Orthopedics;  Laterality: Right;   TUBAL LIGATION  1983   UMBILICAL HERNIA REPAIR  yrs ago   VENTRAL HERNIA REPAIR  06/20/2012   Procedure: LAPAROSCOPIC VENTRAL HERNIA;  Surgeon: Mariella Saa, MD;  Location: [facility];  Service: General;  Laterality: N/A;  Laparoscopic Repair of Ventral Hernia with mesh   VENTRAL HERNIA REPAIR N/A 12/13/2013   Procedure: LAPAROSCOPIC REPAIR RECURRENT VENTRAL INCISIONAL  HERNIA;  Surgeon: Mariella Saa, MD;  Location: [facility];  Service: General;  Laterality: N/A;    Family History  Problem Relation Age of Onset   Arrhythmia Mother    Hypertension Mother    Stroke Mother    Prostate cancer Father 71 - 22       metastatic   Breast cancer Sister        60s   Breast cancer Sister        26s   Stroke Maternal Grandmother    Diabetes Paternal Grandmother    Brain cancer Cousin        dx. >50    Allergies  Allergen Reactions   Dilaudid [Hydromorphone Hcl] Shortness Of Breath, Nausea And Vomiting and Other (See Comments)    Able to take morphine without issue   Shellfish Allergy Shortness Of Breath and Rash    Only shrimp allergy   Aggrenox [Aspirin-Dipyridamole Er] Nausea And Vomiting and Other (See Comments)    Headache    Codeine Nausea And Vomiting   Doxycycline Monohydrate Diarrhea   Cephalexin Itching and Rash   Codeine Phosphate Nausea And Vomiting   Contrast Media [Iodinated Contrast Media] Nausea And Vomiting   Hydrocodone Nausea And Vomiting and Other (See Comments)    Can take morphine without issue   Meperidine Hcl Nausea And Vomiting   Percocet [Oxycodone-Acetaminophen] Nausea And Vomiting and Other (See Comments)    Can take morphine without issue    Current Outpatient Medications on File Prior to Visit   Medication Sig Dispense Refill   amLODipine (NORVASC)  10 MG tablet TAKE 1 TABLET BY MOUTH EVERY DAY 90 tablet 1   atorvastatin (LIPITOR) 40 MG tablet TAKE 1 TABLET BY MOUTH EVERY DAY 30 tablet 1   butalbital-acetaminophen-caffeine (FIORICET) 50-325-40 MG tablet TAKE 1 TABLET BY MOUTH EVERY 6 HOURS AS NEEDED FOR HEADACHE 60 tablet 5   clopidogrel (PLAVIX) 75 MG tablet TAKE 1 TABLET (75 MG TOTAL) BY MOUTH DAILY. *LABS REQUIRED FOR FUTURE REFILLS* 90 tablet 1   diphenoxylate-atropine (LOMOTIL) 2.5-0.025 MG tablet Take 2 tablets by mouth 4 (four) times daily as needed for diarrhea or loose stools. 60 tablet 0   ketoconazole (NIZORAL) 2 % cream Apply 1 Application topically 2 (two) times daily. 30 g 2   levothyroxine (SYNTHROID) 125 MCG tablet TAKE 1 TABLET BY MOUTH EVERY DAY BEFORE BREAKFAST 90 tablet 1   losartan (COZAAR) 100 MG tablet TAKE 1 TABLET BY MOUTH EVERY DAY 90 tablet 2   metoprolol succinate (TOPROL-XL) 25 MG 24 hr tablet TAKE 1 TABLET BY MOUTH EVERY DAY. (NEED APPT FOR REFILLS) 90 tablet 1   metroNIDAZOLE (FLAGYL) 500 MG tablet Take 1 tablet (500 mg total) by mouth 3 (three) times daily. 30 tablet 0   potassium chloride (KLOR-CON 10) 10 MEQ tablet Take 1 tablet (10 mEq total) by mouth 2 (two) times daily. Take 2 tab twice daily for 5 days 20 tablet 0   potassium chloride (KLOR-CON M) 10 MEQ tablet Take 2 tablets twice a day 360 tablet 3   traMADol (ULTRAM) 50 MG tablet TAKE 1 TO 2 TABLETS BY MOUTH EVERY 4 TO 6 HOURS AS NEEDED FOR PAIN 120 tablet 5   No current facility-administered medications on file prior to visit.    BP 122/80   Pulse 67   Temp 98.5 F (36.9 C) (Oral)   Ht 5' 5.5" (1.664 m)   Wt 191 lb (86.6 kg)   LMP  (LMP Unknown)   SpO2 97%   BMI 31.30 kg/m       Objective:   Physical Exam Vitals and nursing note reviewed.  Constitutional:      Appearance: Normal appearance.  Cardiovascular:     Rate and Rhythm: Normal rate and regular rhythm.     Pulses: Normal  pulses.     Heart sounds: Normal heart sounds.  Pulmonary:     Effort: Pulmonary effort is normal.     Breath sounds: Normal breath sounds.  Abdominal:     General: Abdomen is flat. Bowel sounds are normal.     Palpations: Abdomen is soft. There is no mass.     Tenderness: There is abdominal tenderness in the right lower quadrant, periumbilical area and left lower quadrant.     Hernia: No hernia is present.  Musculoskeletal:        General: Normal range of motion.  Skin:    General: Skin is warm and dry.  Neurological:     General: No focal deficit present.     Mental Status: She is alert and oriented to person, place, and time.  Psychiatric:        Mood and Affect: Mood normal.        Behavior: Behavior normal.        Thought Content: Thought content normal.        Judgment: Judgment normal.        Assessment & Plan:  1. Diarrhea, unspecified type (Primary)  - Comprehensive metabolic panel; Future - CBC; Future  2. Generalized abdominal pain  - Comprehensive metabolic panel;  Future - CBC; Future  3. Diverticulitis of sigmoid colon - Continuation of diverticulitis due to not finishing antibiotic course. Will place on Augmentin and send in Zofran incase she has nausea. Will check lab work today  - Follow up if not improving over the next 2-3 days or sooner if fevers develop  - Comprehensive metabolic panel; Future - CBC; Future - amoxicillin-clavulanate (AUGMENTIN) 875-125 MG tablet; Take 1 tablet by mouth 2 (two) times daily.  Dispense: 20 tablet; Refill: 0 - ondansetron (ZOFRAN) 4 MG tablet; Take 1 tablet (4 mg total) by mouth every 8 (eight) hours as needed for nausea or vomiting.  Dispense: 20 tablet; Refill: 0  Shirline Frees, NP  Time spent with patient today was 31 minutes which consisted of chart review, discussing  diverticulitis, finishing antibiotics and diarrhea, work up, treatment answering questions and documentation.

## 2023-12-13 ENCOUNTER — Other Ambulatory Visit: Payer: Self-pay | Admitting: Adult Health

## 2023-12-13 MED ORDER — POTASSIUM CHLORIDE ER 10 MEQ PO TBCR: 10.0000 meq | EXTENDED_RELEASE_TABLET | Freq: Two times a day (BID) | ORAL | 0 refills | Status: DC

## 2023-12-18 DIAGNOSIS — M545 Low back pain, unspecified: Secondary | ICD-10-CM | POA: Diagnosis not present

## 2023-12-25 DIAGNOSIS — Z8673 Personal history of transient ischemic attack (TIA), and cerebral infarction without residual deficits: Secondary | ICD-10-CM | POA: Diagnosis not present

## 2023-12-25 DIAGNOSIS — Z170 Estrogen receptor positive status [ER+]: Secondary | ICD-10-CM | POA: Diagnosis not present

## 2023-12-25 DIAGNOSIS — Z7902 Long term (current) use of antithrombotics/antiplatelets: Secondary | ICD-10-CM | POA: Diagnosis not present

## 2023-12-25 DIAGNOSIS — C50412 Malignant neoplasm of upper-outer quadrant of left female breast: Secondary | ICD-10-CM | POA: Diagnosis not present

## 2023-12-25 DIAGNOSIS — Z803 Family history of malignant neoplasm of breast: Secondary | ICD-10-CM | POA: Diagnosis not present

## 2023-12-30 ENCOUNTER — Other Ambulatory Visit: Payer: Self-pay | Admitting: Physician Assistant

## 2024-01-01 DIAGNOSIS — R2689 Other abnormalities of gait and mobility: Secondary | ICD-10-CM | POA: Diagnosis not present

## 2024-01-01 DIAGNOSIS — M4326 Fusion of spine, lumbar region: Secondary | ICD-10-CM | POA: Diagnosis not present

## 2024-01-10 DIAGNOSIS — R2689 Other abnormalities of gait and mobility: Secondary | ICD-10-CM | POA: Diagnosis not present

## 2024-01-10 DIAGNOSIS — M4326 Fusion of spine, lumbar region: Secondary | ICD-10-CM | POA: Diagnosis not present

## 2024-01-12 ENCOUNTER — Other Ambulatory Visit: Payer: Self-pay | Admitting: General Surgery

## 2024-01-12 DIAGNOSIS — Z803 Family history of malignant neoplasm of breast: Secondary | ICD-10-CM

## 2024-01-12 DIAGNOSIS — Z170 Estrogen receptor positive status [ER+]: Secondary | ICD-10-CM

## 2024-01-17 DIAGNOSIS — M4326 Fusion of spine, lumbar region: Secondary | ICD-10-CM | POA: Diagnosis not present

## 2024-01-17 DIAGNOSIS — R2689 Other abnormalities of gait and mobility: Secondary | ICD-10-CM | POA: Diagnosis not present

## 2024-01-18 DIAGNOSIS — M545 Low back pain, unspecified: Secondary | ICD-10-CM | POA: Diagnosis not present

## 2024-01-18 DIAGNOSIS — M25562 Pain in left knee: Secondary | ICD-10-CM | POA: Diagnosis not present

## 2024-01-19 ENCOUNTER — Ambulatory Visit
Admission: RE | Admit: 2024-01-19 | Discharge: 2024-01-19 | Disposition: A | Discharge status: Home / Self Care | Source: Ambulatory Visit | Attending: General Surgery | Admitting: General Surgery

## 2024-01-19 DIAGNOSIS — Z86000 Personal history of in-situ neoplasm of breast: Secondary | ICD-10-CM | POA: Diagnosis not present

## 2024-01-19 DIAGNOSIS — Z803 Family history of malignant neoplasm of breast: Secondary | ICD-10-CM

## 2024-01-19 DIAGNOSIS — Z170 Estrogen receptor positive status [ER+]: Secondary | ICD-10-CM

## 2024-01-19 DIAGNOSIS — N644 Mastodynia: Secondary | ICD-10-CM | POA: Diagnosis not present

## 2024-01-19 MED ORDER — GADOPICLENOL 0.5 MMOL/ML IV SOLN
9.0000 mL | Freq: Once | INTRAVENOUS | Status: AC | PRN
Start: 2024-01-19 — End: 2024-01-19
  Administered 2024-01-19: 9 mL via INTRAVENOUS

## 2024-01-22 DIAGNOSIS — M4326 Fusion of spine, lumbar region: Secondary | ICD-10-CM | POA: Diagnosis not present

## 2024-01-22 DIAGNOSIS — R2689 Other abnormalities of gait and mobility: Secondary | ICD-10-CM | POA: Diagnosis not present

## 2024-01-26 DIAGNOSIS — M1712 Unilateral primary osteoarthritis, left knee: Secondary | ICD-10-CM | POA: Diagnosis not present

## 2024-01-29 ENCOUNTER — Encounter (HOSPITAL_COMMUNITY): Payer: Self-pay

## 2024-01-30 DIAGNOSIS — M4326 Fusion of spine, lumbar region: Secondary | ICD-10-CM | POA: Diagnosis not present

## 2024-01-30 DIAGNOSIS — R2689 Other abnormalities of gait and mobility: Secondary | ICD-10-CM | POA: Diagnosis not present

## 2024-02-01 ENCOUNTER — Other Ambulatory Visit: Payer: Self-pay | Admitting: Cardiology

## 2024-02-01 DIAGNOSIS — R2689 Other abnormalities of gait and mobility: Secondary | ICD-10-CM | POA: Diagnosis not present

## 2024-02-01 DIAGNOSIS — M4326 Fusion of spine, lumbar region: Secondary | ICD-10-CM | POA: Diagnosis not present

## 2024-02-05 ENCOUNTER — Ambulatory Visit: Admitting: Family Medicine

## 2024-02-05 ENCOUNTER — Encounter: Payer: Self-pay | Admitting: Family Medicine

## 2024-02-05 VITALS — BP 120/66 | HR 60 | Temp 98.1°F | Ht 65.5 in | Wt 193.0 lb

## 2024-02-05 DIAGNOSIS — G4486 Cervicogenic headache: Secondary | ICD-10-CM | POA: Diagnosis not present

## 2024-02-05 NOTE — Progress Notes (Signed)
   Subjective:    Patient ID: Abigail [name], female    DOB: 15-Aug-1946, 78 y.o.   MRN: [id number]  HPI Here for intermittent headaches that started about 3 months ago. They have been coming more frequently, and this has alarmed her. She had a stroke in 2012 so she always worries about having another one. The headaches come about one day a week, and they last about 10-15 seconds at a time. On a day when she has the headaches, she will have 1-3 over the sourse of 24 hours. No vision changes. No other neurologic changes. The headaches are always on the left side of the head, and sometimes she feels a pain behind the left eye. She has an appt to see Dr. Janett Medin for a neurology follow up in October.    Review of Systems  Constitutional: Negative.   Respiratory: Negative.    Cardiovascular: Negative.   Musculoskeletal:  Positive for neck pain and neck stiffness.  Neurological:  Positive for headaches.       Objective:   Physical Exam Constitutional:      Appearance: Normal appearance. She is well-developed.  Cardiovascular:     Rate and Rhythm: Normal rate and regular rhythm.     Pulses: Normal pulses.     Heart sounds: Normal heart sounds.  Pulmonary:     Effort: Pulmonary effort is normal.     Breath sounds: Normal breath sounds.  Musculoskeletal:     Comments: She is quite tender along the left posterior neck,  and palpation of this area causes her left temple to have pain. She has full extension and flexion of the neck, but rotation is limited by pain   Neurological:     Mental Status: She is alert and oriented to person, place, and time. Mental status is at baseline.           Assessment & Plan:  She is having left sided headaches which seem to originate from the neck. We will set up CT scans of the head and neck soon. I reassured her that these pains are not related to her stroke at all.  Corita Diego, MD

## 2024-02-07 ENCOUNTER — Other Ambulatory Visit: Payer: Self-pay | Admitting: Family Medicine

## 2024-02-07 DIAGNOSIS — E7800 Pure hypercholesterolemia, unspecified: Secondary | ICD-10-CM

## 2024-02-13 ENCOUNTER — Ambulatory Visit
Admission: RE | Admit: 2024-02-13 | Discharge: 2024-02-13 | Disposition: A | Discharge status: Home / Self Care | Source: Ambulatory Visit | Attending: Family Medicine | Admitting: Family Medicine

## 2024-02-13 ENCOUNTER — Other Ambulatory Visit

## 2024-02-13 DIAGNOSIS — I6782 Cerebral ischemia: Secondary | ICD-10-CM | POA: Diagnosis not present

## 2024-02-13 DIAGNOSIS — G4486 Cervicogenic headache: Secondary | ICD-10-CM

## 2024-02-13 DIAGNOSIS — R519 Headache, unspecified: Secondary | ICD-10-CM | POA: Diagnosis not present

## 2024-02-19 DIAGNOSIS — M4326 Fusion of spine, lumbar region: Secondary | ICD-10-CM | POA: Diagnosis not present

## 2024-02-19 DIAGNOSIS — R2689 Other abnormalities of gait and mobility: Secondary | ICD-10-CM | POA: Diagnosis not present

## 2024-02-20 ENCOUNTER — Ambulatory Visit
Admission: RE | Admit: 2024-02-20 | Discharge: 2024-02-20 | Disposition: A | Discharge status: Home / Self Care | Source: Ambulatory Visit | Attending: Family Medicine | Admitting: Family Medicine

## 2024-02-20 DIAGNOSIS — M47812 Spondylosis without myelopathy or radiculopathy, cervical region: Secondary | ICD-10-CM | POA: Diagnosis not present

## 2024-02-20 DIAGNOSIS — G4486 Cervicogenic headache: Secondary | ICD-10-CM

## 2024-02-26 ENCOUNTER — Telehealth: Payer: Self-pay | Admitting: Neurology

## 2024-02-26 NOTE — Telephone Encounter (Signed)
 Patient said pain on left temple and imbalance in walking (hold on the husband to walk to keep from falling or bump into somebody). Do not take anything for the pain, the pain from and goes and only last less than  minute. Probably pain on left side of temple have one time a week. Would like a call back for an earlier appointment   Reschedule appt for Imbalance 04/08/24 at 1:00 pm. Informed patient would need a referral for pain on left temple.

## 2024-02-26 NOTE — Telephone Encounter (Signed)
 Noted. At this time there is no sooner availability for Dr Janett Medin. I will place the pt on waitlist and see if anything opens up sooner to offer

## 2024-02-29 ENCOUNTER — Other Ambulatory Visit: Payer: Self-pay | Admitting: Family Medicine

## 2024-03-10 ENCOUNTER — Other Ambulatory Visit: Payer: Self-pay | Admitting: Family Medicine

## 2024-03-11 ENCOUNTER — Ambulatory Visit: Payer: Self-pay | Admitting: Family Medicine

## 2024-03-15 ENCOUNTER — Telehealth: Payer: Self-pay | Admitting: Family Medicine

## 2024-03-15 ENCOUNTER — Ambulatory Visit (INDEPENDENT_AMBULATORY_CARE_PROVIDER_SITE_OTHER): Payer: Medicare PPO

## 2024-03-15 VITALS — BP 120/60 | HR 63 | Temp 98.5°F | Ht 65.5 in | Wt 194.5 lb

## 2024-03-15 DIAGNOSIS — Z0000 Encounter for general adult medical examination without abnormal findings: Secondary | ICD-10-CM | POA: Diagnosis not present

## 2024-03-15 NOTE — Progress Notes (Signed)
 Subjective:   Abigail [name] is a 78 y.o. who presents for a Medicare Wellness preventive visit.  As a reminder, Annual Wellness Visits don't include a physical exam, and some assessments may be limited, especially if this visit is performed virtually. We may recommend an in-person follow-up visit with your provider if needed.  Visit Complete: In person    Persons Participating in Visit: Patient.  AWV Questionnaire: No: Patient Medicare AWV questionnaire was not completed prior to this visit.  Cardiac Risk Factors include: advanced age (>69men, >35 women);hypertension     Objective:    Today's Vitals   03/15/24 1543  BP: 120/60  Pulse: 63  Temp: 98.5 F (36.9 C)  TempSrc: Oral  SpO2: 97%  Weight: 194 lb 8 oz (88.2 kg)  Height: 5' 5.5 (1.664 m)   Body mass index is 31.87 kg/m.     03/15/2024    4:02 PM 08/29/2023   12:42 PM 08/02/2023   12:57 PM 07/24/2023    3:46 PM 04/06/2023    6:52 AM 03/28/2023    8:53 AM 11/02/2020    6:02 PM  Advanced Directives  Does Patient Have a Medical Advance Directive? Yes Yes Yes Yes Yes Yes No  Type of Estate agent of [place];Living will Living will  Healthcare Power of [place];Living will Healthcare Power of [place];Living will Living will;Healthcare Power of Attorney   Does patient want to make changes to medical advance directive?   No - Patient declined No - Patient declined     Copy of Healthcare Power of Attorney in Chart? No - copy requested Yes - validated most recent copy scanned in chart (See row information)  Yes - validated most recent copy scanned in chart (See row information) No - copy requested No - copy requested   Would patient like information on creating a medical advance directive?   No - Patient declined        Current Medications (verified) Outpatient Encounter Medications as of 03/15/2024  Medication Sig   amLODipine  (NORVASC ) 10 MG tablet TAKE 1 TABLET BY MOUTH EVERY DAY    amoxicillin -clavulanate (AUGMENTIN ) 875-125 MG tablet Take 1 tablet by mouth 2 (two) times daily. (Patient not taking: Reported on 03/15/2024)   atorvastatin  (LIPITOR) 40 MG tablet TAKE 1 TABLET BY MOUTH EVERY DAY   butalbital -acetaminophen -caffeine  (FIORICET ) 50-325-40 MG tablet TAKE 1 TABLET BY MOUTH EVERY 6 HOURS AS NEEDED FOR HEADACHE   clopidogrel  (PLAVIX ) 75 MG tablet TAKE 1 TABLET (75 MG TOTAL) BY MOUTH DAILY. *LABS REQUIRED FOR FUTURE REFILLS*   ketoconazole  (NIZORAL ) 2 % cream Apply 1 Application topically 2 (two) times daily.   levothyroxine  (SYNTHROID ) 125 MCG tablet TAKE 1 TABLET BY MOUTH EVERY DAY BEFORE BREAKFAST   losartan  (COZAAR ) 100 MG tablet TAKE 1 TABLET BY MOUTH EVERY DAY   metoprolol  succinate (TOPROL -XL) 25 MG 24 hr tablet TAKE 1 TABLET BY MOUTH EVERY DAY.   ondansetron  (ZOFRAN ) 4 MG tablet Take 1 tablet (4 mg total) by mouth every 8 (eight) hours as needed for nausea or vomiting. (Patient not taking: Reported on 03/15/2024)   potassium chloride  (KLOR-CON  10) 10 MEQ tablet Take 1 tablet (10 mEq total) by mouth 2 (two) times daily. Take 2 tab twice daily for 5 days   traMADol  (ULTRAM ) 50 MG tablet TAKE 1 TO 2 TABLETS BY MOUTH EVERY 4 TO 6 HOURS AS NEEDED FOR PAIN   No facility-administered encounter medications on file as of 03/15/2024.    Allergies (verified) Dilaudid  [hydromorphone   hcl], Shellfish allergy, Aggrenox [aspirin -dipyridamole er], Codeine, Doxycycline  monohydrate, Cephalexin, Codeine phosphate, Contrast media [iodinated contrast media], Hydrocodone , Meperidine  hcl, and Percocet [oxycodone -acetaminophen ]   History: Past Medical History:  Diagnosis Date   Breast cancer (HCC)    Bronchitis 09/2016   Bruises easily    DDD (degenerative disc disease)    Degenerative joint disease of cervical spine 09/05/2011   Cervival area, now some osteoarthritis-lower back and Rt. shoulder   Depression    Diverticulitis 09/05/2011   hx. gastritis, diverticulitis x2 -now  surgery planned   DIVERTICULITIS, ACUTE 02/10/2010   Dyspnea    with exertion   Essential hypertension 09/05/2011   tx. Verapamil    GROIN PAIN 09/21/2010   Headache(784.0)    headaches are better   Hearing loss    right ear   Heart palpitations    Hemorrhoids    History of bronchitis    History of colonic polyps    History of pneumonia    History of tension headache    Hyperlipidemia    Hypertension    Hypothyroidism 09/05/2011   Supplement used   Late effect of adverse effect of drug, medicinal or biological substance    NECK PAIN, ACUTE 09/21/2010   Neuromuscular disorder (HCC)    carpal tunnel in both hands   Nocturia    Osteoarthritis 112-17-12   spine and rt. hip, rt. shoulder   Patent foramen ovale    Small - unable to be closed -    Pneumonia    PONV (postoperative nausea and vomiting)    Sleep apnea 09/05/2011   no cpap ever, had surgery to remove cartilage, no problems now   SORE THROAT 04/30/2009   Stroke (HCC) 09/05/2011   2006/2009-(loss of memory, balance issues remains occ.)   TIA 09/28/2007, 10/2014   Unstable angina (HCC)    a. 05/2010 Cath: nl cors, EF 55%;  b. 05/2015 Lexiscan  MV: small, severe, fixed apical defect and a small, severe, reversible inf lateral defect w/ apical thinning and mild ischemia, EF 54%.   URI 09/02/2008   Vertigo    Past Surgical History:  Procedure Laterality Date   ABDOMINAL HYSTERECTOMY     ANTERIOR LAT LUMBAR FUSION Left 04/05/2023   Procedure: LEFT-SIDED LUMBAR 2- LUMBAR 3, LUMBAR 3- LUMBAR 4 LATERAL INTERBODY FUSION WITH INSTRUMENTATION AND ALLOGRAFT;  Surgeon: Beuford Anes, MD;  Location: [facility];  Service: Orthopedics;  Laterality: Left;   BACK SURGERY     BREAST BIOPSY Left 06/29/2023   MM LT BREAST BX W LOC DEV 1ST LESION IMAGE BX SPEC STEREO GUIDE 06/29/2023 [facility]   BREAST BIOPSY Left 08/02/2023   MM LT PLC BREAST LOC DEV   1ST LESION  INC MAMMO GUIDE 08/02/2023 [facility]   BREAST LUMPECTOMY  WITH RADIOACTIVE SEED LOCALIZATION Left 08/02/2023   Procedure: WIRE LOCALIZED LEFT BREAST LUMPECTOMY;  Surgeon: Aron Shoulders, MD;  Location: [facility];  Service: General;  Laterality: Left;  60 MINUTES   CARDIAC CATHETERIZATION N/A 06/01/2015   Procedure: Left Heart Cath and Coronary Angiography;  Surgeon: Lonni JONETTA Cash, MD;  Location: [facility] INVASIVE CV [facility];  Service: Cardiovascular;  Laterality: N/A;   COLON RESECTION  09/07/2011   Procedure: LAPAROSCOPIC SIGMOID COLON RESECTION;  Surgeon: Morene ONEIDA Olives, MD;  Location: [facility];  Service: General;  Laterality: N/A;  with proctoscopy   COLONOSCOPY  08/06/2015   per Dr. Luis, adenomatous polyps, repeat in 5 yrs    CYST EXCISION N/A 11/29/2016   Procedure: EXCISION  UMBILICAL CYST;  Surgeon: Morene Olives, MD;  Location: [facility];  Service: General;  Laterality: N/A;   ELBOW SURGERY  09-05-11   left elbow -ligament repair   EYE SURGERY     cataract   INSERTION OF MESH N/A 12/13/2013   Procedure: INSERTION OF MESH;  Surgeon: Morene ONEIDA Olives, MD;  Location: [facility];  Service: General;  Laterality: N/A;   LAPAROSCOPY  09/07/2011   Procedure: LAPAROSCOPY DIAGNOSTIC;  Surgeon: Lynwood FORBES Clubs II;  Location: [facility];  Service: Gynecology;  Laterality: N/A;   MAXIMUM ACCESS (MAS)POSTERIOR LUMBAR INTERBODY FUSION (PLIF) 2 LEVEL N/A 05/03/2016   Procedure: L4-5 L5-S1 Maximum access posterior lumbar interbody fusion;  Surgeon: Fairy Levels, MD;  Location: [facility];  Service: Neurosurgery;  Laterality: N/A;  L4-5 L5-S1 Maximum access posterior lumbar interbody fusion   SALPINGOOPHORECTOMY  09/07/2011   Procedure: SALPINGO OOPHERECTOMY;  Surgeon: Lynwood FORBES Clubs II;  Location: [facility];  Service: Gynecology;  Laterality: Right;   THYROIDECTOMY, PARTIAL  09-05-11   TOTAL HIP ARTHROPLASTY Right 08/21/2018   Procedure: TOTAL HIP ARTHROPLASTY ANTERIOR APPROACH;  Surgeon: Sheril Coy, MD;  Location: [facility];  Service:  Orthopedics;  Laterality: Right;   TUBAL LIGATION  1983   UMBILICAL HERNIA REPAIR  yrs ago   VENTRAL HERNIA REPAIR  06/20/2012   Procedure: LAPAROSCOPIC VENTRAL HERNIA;  Surgeon: Morene ONEIDA Olives, MD;  Location: [facility];  Service: General;  Laterality: N/A;  Laparoscopic Repair of Ventral Hernia with mesh   VENTRAL HERNIA REPAIR N/A 12/13/2013   Procedure: LAPAROSCOPIC REPAIR RECURRENT VENTRAL INCISIONAL  HERNIA;  Surgeon: Morene ONEIDA Olives, MD;  Location: [facility];  Service: General;  Laterality: N/A;   Family History  Problem Relation Age of Onset   Arrhythmia Mother    Hypertension Mother    Stroke Mother    Prostate cancer Father 69 - 20       metastatic   Breast cancer Sister        48s   Breast cancer Sister        12s   Stroke Maternal Grandmother    Diabetes Paternal Grandmother    Brain cancer Cousin        dx. >50   Social History   Socioeconomic History   Marital status: Married    Spouse name: Not on file   Number of children: 3   Years of education: Not on file   Highest education level: Not on file  Occupational History   Not on file  Tobacco Use   Smoking status: Former    Current packs/day: 0.00    Average packs/day: 0.5 packs/day for 5.0 years (2.5 ttl pk-yrs)    Types: Cigarettes    Start date: 09/20/1963    Quit date: 09/19/1968    Years since quitting: 55.5   Smokeless tobacco: Never  Vaping Use   Vaping status: Never Used  Substance and Sexual Activity   Alcohol  use: Yes    Alcohol /week: 2.0 standard drinks of alcohol     Types: 1 Cans of beer, 1 Shots of liquor per week    Comment: rarely   Drug use: No   Sexual activity: Yes  Other Topics Concern   Not on file  Social History Narrative   Lives in [place] with her husband.  She does not routinely exercise.   Social Drivers of Health   Financial Resource Strain: Low Risk  (03/15/2024)   Overall Financial Resource Strain (CARDIA)    Difficulty of Paying  Living Expenses: Not hard at all  Food  Insecurity: No Food Insecurity (03/15/2024)   Hunger Vital Sign    Worried About Running Out of Food in the Last Year: Never true    Ran Out of Food in the Last Year: Never true  Transportation Needs: No Transportation Needs (03/15/2024)   PRAPARE - Administrator, Civil Service (Medical): No    Lack of Transportation (Non-Medical): No  Physical Activity: Inactive (03/15/2024)   Exercise Vital Sign    Days of Exercise per Week: 0 days    Minutes of Exercise per Session: 0 min  Stress: No Stress Concern Present (03/15/2024)   [employer] of Occupational Health - Occupational Stress Questionnaire    Feeling of Stress: Not at all  Social Connections: Socially Integrated (03/15/2024)   Social Connection and Isolation Panel    Frequency of Communication with Friends and Family: More than three times a week    Frequency of Social Gatherings with Friends and Family: More than three times a week    Attends Religious Services: More than 4 times per year    Active Member of [employer] or Organizations: Yes    Attends Engineer, structural: More than 4 times per year    Marital Status: Married    Tobacco Counseling Counseling given: Not Answered    Clinical Intake:  Pre-visit preparation completed: Yes  Pain : No/denies pain     BMI - recorded: 31.87 Nutritional Status: BMI > 30  Obese Nutritional Risks: None Diabetes: No  Lab Results  Component Value Date   HGBA1C 6.5 01/06/2023   HGBA1C 6.3 02/21/2022   HGBA1C 5.9 (H) 09/29/2021     How often do you need to have someone help you when you read instructions, pamphlets, or other written materials from your doctor or pharmacy?: 1 - Never  Interpreter Needed?: No  Information entered by :: Rojelio Blush LPN   Activities of Daily Living     03/15/2024    4:01 PM 08/02/2023    1:02 PM  In your present state of health, do you have any difficulty performing the following activities:  Hearing? 0 0  Vision?  0 0  Difficulty concentrating or making decisions? 0 0  Walking or climbing stairs? 1   Comment Uses Cane   Dressing or bathing? 0   Doing errands, shopping? 0   Preparing Food and eating ? N   Using the Toilet? N   In the past six months, have you accidently leaked urine? N   Do you have problems with loss of bowel control? N   Managing your Medications? N   Managing your Finances? N   Housekeeping or managing your Housekeeping? N     Patient Care Team: Johnny Garnette LABOR, MD as PCP - General (Family Medicine) Pietro Redell RAMAN, MD as PCP - Cardiology (Cardiology) Glean Stephane BROCKS, RN (Inactive) as Registered Nurse Tyree Nanetta SAILOR, RN as Registered Nurse Lanny Callander, MD as Consulting Physician (Hematology) Izell Domino, MD as Attending Physician (Radiation Oncology) Aron Shoulders, MD as Consulting Physician (General Surgery)  I have updated your Care Teams any recent Medical Services you may have received from other providers in the past year.     Assessment:   This is a routine wellness examination for [employer].  Hearing/Vision screen Hearing Screening - Comments:: Denies hearing difficulties   Vision Screening - Comments:: Wears rx glasses - up to date with routine eye exams with  [facility]  Goals Addressed               This Visit's Progress     Increase physical activity (pt-stated)        Remain active.       Depression Screen     03/15/2024    3:46 PM 08/29/2023   12:45 PM 07/05/2023    3:51 PM 04/26/2023    1:56 PM 08/01/2022    2:16 PM 06/01/2022   10:59 AM 01/12/2022    2:13 PM  PHQ 2/9 Scores  PHQ - 2 Score 0 0 0 0 0 0 0  PHQ- 9 Score    0 0 3 0    Fall Risk     03/15/2024    4:01 PM 04/26/2023    1:56 PM 08/01/2022    2:16 PM 06/01/2022   10:59 AM 01/12/2022    2:14 PM  Fall Risk   Falls in the past year? 1 0 0 1 0  Number falls in past yr: 0 0 0 0 0  Injury with Fall? 0 0 0 0 0  Risk for fall due to : No Fall Risks No Fall Risks Impaired  balance/gait;History of fall(s) No Fall Risks History of fall(s);Impaired balance/gait  Follow up Falls evaluation completed Falls evaluation completed Falls evaluation completed  Falls evaluation completed  Falls evaluation completed      Data saved with a previous flowsheet row definition    MEDICARE RISK AT HOME:  Medicare Risk at Home Any stairs in or around the home?: Yes If so, are there any without handrails?: No Home free of loose throw rugs in walkways, pet beds, electrical cords, etc?: Yes Adequate lighting in your home to reduce risk of falls?: Yes Life alert?: No Use of a cane, walker or w/c?: Yes Grab bars in the bathroom?: Yes Shower chair or bench in shower?: Yes Elevated toilet seat or a handicapped toilet?: No  TIMED UP AND GO:  Was the test performed?  Yes  Length of time to ambulate 10 feet: 10 sec Gait slow and steady with assistive device  Cognitive Function: 6CIT completed        03/15/2024    4:02 PM  6CIT Screen  What Year? 0 points  What month? 0 points  What time? 0 points  Count back from 20 0 points  Months in reverse 0 points  Repeat phrase 0 points  Total Score 0 points    Immunizations Immunization History  Administered Date(s) Administered   Fluad Quad(high Dose 65+) 06/07/2019   Influenza Split 08/08/2011, 07/25/2012   Influenza Whole 07/23/2007, 05/28/2009   Influenza, High Dose Seasonal PF 06/05/2015, 06/27/2017, 05/28/2018   Influenza,inj,Quad PF,6+ Mos 06/14/2013   Moderna Covid-19 Vaccine Bivalent Booster 19yrs & up 06/28/2021   Moderna Sars-Covid-2 Vaccination 10/08/2019, 11/06/2019, 04/29/2020, 12/18/2020   Pneumococcal Conjugate-13 06/18/2015   Pneumococcal Polysaccharide-23 08/08/2011   Tdap 08/08/2012   Zoster, Live 11/26/2014    Screening Tests Health Maintenance  Topic Date Due   Hepatitis C Screening  Never done   Zoster Vaccines- Shingrix (1 of 2) 04/24/1964   COVID-19 Vaccine (6 - 2024-25 season) 05/21/2023    INFLUENZA VACCINE  04/19/2024   Medicare Annual Wellness (AWV)  03/15/2025   Pneumococcal Vaccine: 50+ Years  Completed   DEXA SCAN  Completed   Hepatitis B Vaccines  Aged Out   HPV VACCINES  Aged Out   Meningococcal B Vaccine  Aged Out   DTaP/Tdap/Td  Discontinued   Colonoscopy  Discontinued    Health Maintenance  Health Maintenance Due  Topic Date Due   Hepatitis C Screening  Never done   Zoster Vaccines- Shingrix (1 of 2) 04/24/1964   COVID-19 Vaccine (6 - 2024-25 season) 05/21/2023   Health Maintenance Items Addressed:   Additional Screening:  Vision Screening: Recommended annual ophthalmology exams for early detection of glaucoma and other disorders of the eye. Would you like a referral to an eye doctor? No    Dental Screening: Recommended annual dental exams for proper oral hygiene  Community Resource Referral / Chronic Care Management: CRR required this visit?  No   CCM required this visit?  No   Plan:    I have personally reviewed and noted the following in the patient's chart:   Medical and social history Use of alcohol , tobacco or illicit drugs  Current medications and supplements including opioid prescriptions. Patient is currently taking opioid prescriptions. Information provided to patient regarding non-opioid alternatives. Patient advised to discuss non-opioid treatment plan with their provider. Functional ability and status Nutritional status Physical activity Advanced directives List of other physicians Hospitalizations, surgeries, and ER visits in previous 12 months Vitals Screenings to include cognitive, depression, and falls Referrals and appointments  In addition, I have reviewed and discussed with patient certain preventive protocols, quality metrics, and best practice recommendations. A written personalized care plan for preventive services as well as general preventive health recommendations were provided to patient.   Rojelio LELON Blush,  LPN   [id number]   After Visit Summary: (In Person-Printed) AVS printed and given to the patient  Notes: Nothing significant to report at this time.

## 2024-03-15 NOTE — Telephone Encounter (Signed)
 Patient requests a call to discuss in detail her most recent lab results

## 2024-03-15 NOTE — Patient Instructions (Addendum)
 Abigail [name] , Thank you for taking time out of your busy schedule to complete your Annual Wellness Visit with me. I enjoyed our conversation and look forward to speaking with you again next year. I, as well as your care team,  appreciate your ongoing commitment to your health goals. Please review the following plan we discussed and let me know if I can assist you in the future. Your Game plan/ To Do List    Referrals: If you haven't heard from the office you've been referred to, please reach out to them at the phone provided.   Follow up Visits: Next Medicare AWV with our clinical staff: 03/24/25 @ 3:40p   Have you seen your provider in the last 6 months (3 months if uncontrolled diabetes)? 12/12/23 Next Office Visit with your provider:   Clinician Recommendations:  Aim for 30 minutes of exercise or brisk walking, 6-8 glasses of water, and 5 servings of fruits and vegetables each day.       This is a list of the screening recommended for you and due dates:  Health Maintenance  Topic Date Due   Hepatitis C Screening  Never done   Zoster (Shingles) Vaccine (1 of 2) 04/24/1965   COVID-19 Vaccine (6 - 2024-25 season) 05/21/2023   Flu Shot  04/19/2024   Medicare Annual Wellness Visit  03/15/2025   Pneumococcal Vaccine for age over 44  Completed   DEXA scan (bone density measurement)  Completed   Hepatitis B Vaccine  Aged Out   HPV Vaccine  Aged Out   Meningitis B Vaccine  Aged Out   DTaP/Tdap/Td vaccine  Discontinued   Colon Cancer Screening  Discontinued   Opioid Pain Medicine Management Opioids are powerful medicines that are used to treat moderate to severe pain. When used for short periods of time, they can help you to: Sleep better. Do better in physical or occupational therapy. Feel better in the first few days after an injury. Recover from surgery. Opioids should be taken with the supervision of a trained health care provider. They should be taken for the shortest period of time  possible. This is because opioids can be addictive, and the longer you take opioids, the greater your risk of addiction. This addiction can also be called opioid use disorder. What are the risks? Using opioid pain medicines for longer than 3 days increases your risk of side effects. Side effects include: Constipation. Nausea and vomiting. Breathing difficulties (respiratory depression). Drowsiness. Confusion. Opioid use disorder. Itching. Taking opioid pain medicine for a long period of time can affect your ability to do daily tasks. It also puts you at risk for: Motor vehicle crashes. Depression. Suicide. Heart attack. Overdose, which can be life-threatening. What is a pain treatment plan? A pain treatment plan is an agreement between you and your health care provider. Pain is unique to each person, and treatments vary depending on your condition. To manage your pain, you and your health care provider need to work together. To help you do this: Discuss the goals of your treatment, including how much pain you might expect to have and how you will manage the pain. Review the risks and benefits of taking opioid medicines. Remember that a good treatment plan uses more than one approach and minimizes the chance of side effects. Be honest about the amount of medicines you take and about any drug or alcohol  use. Get pain medicine prescriptions from only one health care provider. Pain can be managed with many types  of alternative treatments. Ask your health care provider to refer you to one or more specialists who can help you manage pain through: Physical or occupational therapy. Counseling (cognitive behavioral therapy). Good nutrition. Biofeedback. Massage. Meditation. Non-opioid medicine. Following a gentle exercise program. How to use opioid pain medicine Taking medicine Take your pain medicine exactly as told by your health care provider. Take it only when you need it. If your pain  gets less severe, you may take less than your prescribed dose if your health care provider approves. If you are not having pain, do nottake pain medicine unless your health care provider tells you to take it. If your pain is severe, do nottry to treat it yourself by taking more pills than instructed on your prescription. Contact your health care provider for help. Write down the times when you take your pain medicine. It is easy to become confused while on pain medicine. Writing the time can help you avoid overdose. Take other over-the-counter or prescription medicines only as told by your health care provider. Keeping yourself and others safe  While you are taking opioid pain medicine: Do not drive, use machinery, or power tools. Do not sign legal documents. Do not drink alcohol . Do not take sleeping pills. Do not supervise children by yourself. Do not do activities that require climbing or being in high places. Do not go to a lake, river, ocean, spa, or swimming pool. Do not share your pain medicine with anyone. Keep pain medicine in a locked cabinet or in a secure area where pets and children cannot reach it. Stopping your use of opioids If you have been taking opioid medicine for more than a few weeks, you may need to slowly decrease (taper) how much you take until you stop completely. Tapering your use of opioids can decrease your risk of symptoms of withdrawal, such as: Pain and cramping in the abdomen. Nausea. Sweating. Sleepiness. Restlessness. Uncontrollable shaking (tremors). Cravings for the medicine. Do not attempt to taper your use of opioids on your own. Talk with your health care provider about how to do this. Your health care provider may prescribe a step-down schedule based on how much medicine you are taking and how long you have been taking it. Getting rid of leftover pills Do not save any leftover pills. Get rid of leftover pills safely by: Taking the medicine to a  prescription take-back program. This is usually offered by the county or law enforcement. Bringing them to a pharmacy that has a drug disposal container. Flushing them down the toilet. Check the label or package insert of your medicine to see whether this is safe to do. Throwing them out in the trash. Check the label or package insert of your medicine to see whether this is safe to do. If it is safe to throw it out, remove the medicine from the original container, put it into a sealable bag or container, and mix it with used coffee grounds, food scraps, dirt, or cat litter before putting it in the trash. Follow these instructions at home: Activity Do exercises as told by your health care provider. Avoid activities that make your pain worse. Return to your normal activities as told by your health care provider. Ask your health care provider what activities are safe for you. General instructions You may need to take these actions to prevent or treat constipation: Drink enough fluid to keep your urine pale yellow. Take over-the-counter or prescription medicines. Eat foods that are high in fiber,  such as beans, whole grains, and fresh fruits and vegetables. Limit foods that are high in fat and processed sugars, such as fried or sweet foods. Keep all follow-up visits. This is important. Where to find support If you have been taking opioids for a long time, you may benefit from receiving support for quitting from a local support group or counselor. Ask your health care provider for a referral to these resources in your area. Where to find more information Centers for Disease Control and Prevention (CDC): FootballExhibition.com.br [place] Food and Drug Administration (FDA): PumpkinSearch.com.ee Get help right away if: You may have taken too much of an opioid (overdosed). Common symptoms of an overdose: Your breathing is slower or more shallow than normal. You have a very slow heartbeat (pulse). You have slurred  speech. You have nausea and vomiting. Your pupils become very small. You have other potential symptoms: You are very confused. You faint or feel like you will faint. You have cold, clammy skin. You have blue lips or fingernails. You have thoughts of harming yourself or harming others. These symptoms may represent a serious problem that is an emergency. Do not wait to see if the symptoms will go away. Get medical help right away. Call your local emergency services (911 in the [place] Do not drive yourself to the hospital.  If you ever feel like you may hurt yourself or others, or have thoughts about taking your own life, get help right away. Go to your nearest emergency department or: Call your local emergency services (911 in the [place] Call the [facility] (330-835-8869 in the [place] Call a suicide crisis helpline, such as the National Suicide Prevention Lifeline at (848) 401-5032 or 988 in the [place] This is open 24 hours a day in the [place] If you're a Veteran: Call 988 and press 1. This is open 24 hours a day. Text the [employer] at (276) 543-2415. Summary Opioid medicines can help you manage moderate to severe pain for a short period of time. A pain treatment plan is an agreement between you and your health care provider. Discuss the goals of your treatment, including how much pain you might expect to have and how you will manage the pain. If you think that you or someone else may have taken too much of an opioid, get medical help right away. This information is not intended to replace advice given to you by your health care provider. Make sure you discuss any questions you have with your health care provider. Document Revised: 06/12/2023 Document Reviewed: 12/16/2020 [employer] Patient Education  2024 [employer] Advanced directives: (Copy Requested) Please bring a copy of your health care power of attorney and living will to the office to be added to your chart at  your convenience. You can mail to [facility] [address] [place], [place] [address] or email to [email or web address] Advance Care Planning is important because it:  [x]  Makes sure you receive the medical care that is consistent with your values, goals, and preferences  [x]  It provides guidance to your family and loved ones and reduces their decisional burden about whether or not they are making the right decisions based on your wishes.  Follow the link provided in your after visit summary or read over the paperwork we have mailed to you to help you started getting your Advance Directives in place. If you need assistance in completing these, please reach out to us  so that we can help you!  See attachments for Preventive Care and Fall Prevention Tips.

## 2024-03-18 ENCOUNTER — Other Ambulatory Visit: Payer: Self-pay | Admitting: Family Medicine

## 2024-03-18 NOTE — Telephone Encounter (Signed)
Left pt a message advised to call the office back regarding this message

## 2024-03-20 NOTE — Telephone Encounter (Signed)
Reviewed MRI results with pt verbalized understanding

## 2024-03-25 NOTE — Progress Notes (Deleted)
 CLINIC:  Survivorship   REASON FOR VISIT:  Routine follow-up post-treatment for a recent history of breast cancer.  BRIEF ONCOLOGIC HISTORY:  Oncology History  Ductal carcinoma in situ (DCIS) of left breast  07/03/2023 Initial Diagnosis   Ductal carcinoma in situ (DCIS) of left breast    Genetic Testing   [facility] CancerNext+RNA Panel was Negative. Report date is 07/25/2023.   The CancerNext gene panel offered by [employer] includes sequencing, rearrangement analysis, and RNA analysis for the following 34 genes:  APC, ATM, AXIN2, BARD1, BMPR1A, BRCA1, BRCA2, BRIP1, CDH1, CDK4, CDKN2A, CHEK2, DICER1, HOXB13, EPCAM, GREM1, MLH1, MSH2, MSH3, MSH6, MUTYH, NF1, NTHL1, PALB2, PMS2, POLD1, POLE, PTEN, RAD51C, RAD51D, SMAD4, SMARCA4, STK11, and TP53.    08/02/2023 Cancer Staging   Staging form: Breast, AJCC 8th Edition - Pathologic stage from 08/02/2023: Stage Unknown (pTis (DCIS), pNX, cM0, G2, ER+, PR+) - Signed by Lanny Callander, MD on 09/28/2023 Histologic grading system: 3 grade system Residual tumor (R): R0 - None     INTERVAL HISTORY:  Ms. Glasco presents to the Survivorship Clinic today for our initial meeting to review her survivorship care plan detailing her treatment course for breast cancer, as well as monitoring long-term side effects of that treatment, education regarding health maintenance, screening, and overall wellness and health promotion.     Overall, Ms. Mitchner reports feeling quite well since completing her radiation therapy approximately 3 months ago.  She ***    REVIEW OF SYSTEMS:  Review of Systems - Oncology Breast: Denies any new nodularity, masses, tenderness, nipple changes, or nipple discharge.      ONCOLOGY TREATMENT TEAM:  1. Surgeon:  Dr. PIERRETTE at [facility] 2. Medical Oncologist: Dr. PIERRETTE  3. Radiation Oncologist: Dr. PIERRETTE    PAST MEDICAL/SURGICAL HISTORY:  Past Medical History:  Diagnosis Date   Breast cancer (HCC)    Bronchitis 09/2016    Bruises easily    DDD (degenerative disc disease)    Degenerative joint disease of cervical spine 09/05/2011   Cervival area, now some osteoarthritis-lower back and Rt. shoulder   Depression    Diverticulitis 09/05/2011   hx. gastritis, diverticulitis x2 -now surgery planned   DIVERTICULITIS, ACUTE 02/10/2010   Dyspnea    with exertion   Essential hypertension 09/05/2011   tx. Verapamil    GROIN PAIN 09/21/2010   Headache(784.0)    headaches are better   Hearing loss    right ear   Heart palpitations    Hemorrhoids    History of bronchitis    History of colonic polyps    History of pneumonia    History of tension headache    Hyperlipidemia    Hypertension    Hypothyroidism 09/05/2011   Supplement used   Late effect of adverse effect of drug, medicinal or biological substance    NECK PAIN, ACUTE 09/21/2010   Neuromuscular disorder (HCC)    carpal tunnel in both hands   Nocturia    Osteoarthritis 112-17-12   spine and rt. hip, rt. shoulder   Patent foramen ovale    Small - unable to be closed -    Pneumonia    PONV (postoperative nausea and vomiting)    Sleep apnea 09/05/2011   no cpap ever, had surgery to remove cartilage, no problems now   SORE THROAT 04/30/2009   Stroke (HCC) 09/05/2011   2006/2009-(loss of memory, balance issues remains occ.)   TIA 09/28/2007, 10/2014   Unstable angina (HCC)    a. 05/2010 Cath:  nl cors, EF 55%;  b. 05/2015 Lexiscan  MV: small, severe, fixed apical defect and a small, severe, reversible inf lateral defect w/ apical thinning and mild ischemia, EF 54%.   URI 09/02/2008   Vertigo    Past Surgical History:  Procedure Laterality Date   ABDOMINAL HYSTERECTOMY     ANTERIOR LAT LUMBAR FUSION Left 04/05/2023   Procedure: LEFT-SIDED LUMBAR 2- LUMBAR 3, LUMBAR 3- LUMBAR 4 LATERAL INTERBODY FUSION WITH INSTRUMENTATION AND ALLOGRAFT;  Surgeon: Beuford Anes, MD;  Location: [facility];  Service: Orthopedics;  Laterality: Left;   BACK SURGERY      BREAST BIOPSY Left 06/29/2023   MM LT BREAST BX W LOC DEV 1ST LESION IMAGE BX SPEC STEREO GUIDE 06/29/2023 [facility]   BREAST BIOPSY Left 08/02/2023   MM LT PLC BREAST LOC DEV   1ST LESION  INC MAMMO GUIDE 08/02/2023 [facility]   BREAST LUMPECTOMY WITH RADIOACTIVE SEED LOCALIZATION Left 08/02/2023   Procedure: WIRE LOCALIZED LEFT BREAST LUMPECTOMY;  Surgeon: Aron Shoulders, MD;  Location: [facility];  Service: General;  Laterality: Left;  60 MINUTES   CARDIAC CATHETERIZATION N/A 06/01/2015   Procedure: Left Heart Cath and Coronary Angiography;  Surgeon: Lonni JONETTA Cash, MD;  Location: [facility] IN[facility];  Service: Cardiovascular;  Laterality: N/A;   COLON RESECTION  09/07/2011   Procedure: LAPAROSCOPIC SIGMOID COLON RESECTION;  Surgeon: Morene ONEIDA Olives, MD;  Location: [facility];  Service: General;  Laterality: N/A;  with proctoscopy   COLONOSCOPY  08/06/2015   per Dr. Luis, adenomatous polyps, repeat in 5 yrs    CYST EXCISION N/A 11/29/2016   Procedure: EXCISION UMBILICAL CYST;  Surgeon: Morene Olives, MD;  Location: [facility];  Service: General;  Laterality: N/A;   ELBOW SURGERY  09-05-11   left elbow -ligament repair   EYE SURGERY     cataract   INSERTION OF MESH N/A 12/13/2013   Procedure: INSERTION OF MESH;  Surgeon: Morene ONEIDA Olives, MD;  Location: [facility];  Service: General;  Laterality: N/A;   LAPAROSCOPY  09/07/2011   Procedure: LAPAROSCOPY DIAGNOSTIC;  Surgeon: Lynwood FORBES Clubs II;  Location: [facility];  Service: Gynecology;  Laterality: N/A;   MAXIMUM ACCESS (MAS)POSTERIOR LUMBAR INTERBODY FUSION (PLIF) 2 LEVEL N/A 05/03/2016   Procedure: L4-5 L5-S1 Maximum access posterior lumbar interbody fusion;  Surgeon: Fairy Levels, MD;  Location: [facility];  Service: Neurosurgery;  Laterality: N/A;  L4-5 L5-S1 Maximum access posterior lumbar interbody fusion   SALPINGOOPHORECTOMY  09/07/2011   Procedure: SALPINGO OOPHERECTOMY;  Surgeon: Lynwood FORBES Clubs  II;  Location: [facility];  Service: Gynecology;  Laterality: Right;   THYROIDECTOMY, PARTIAL  09-05-11   TOTAL HIP ARTHROPLASTY Right 08/21/2018   Procedure: TOTAL HIP ARTHROPLASTY ANTERIOR APPROACH;  Surgeon: Sheril Coy, MD;  Location: [facility];  Service: Orthopedics;  Laterality: Right;   TUBAL LIGATION  1983   UMBILICAL HERNIA REPAIR  yrs ago   VENTRAL HERNIA REPAIR  06/20/2012   Procedure: LAPAROSCOPIC VENTRAL HERNIA;  Surgeon: Morene ONEIDA Olives, MD;  Location: [facility];  Service: General;  Laterality: N/A;  Laparoscopic Repair of Ventral Hernia with mesh   VENTRAL HERNIA REPAIR N/A 12/13/2013   Procedure: LAPAROSCOPIC REPAIR RECURRENT VENTRAL INCISIONAL  HERNIA;  Surgeon: Morene ONEIDA Olives, MD;  Location: [facility];  Service: General;  Laterality: N/A;     ALLERGIES:  Allergies  Allergen Reactions   Dilaudid  [Hydromorphone  Hcl] Shortness Of Breath, Nausea And Vomiting and Other (See Comments)    Able  to take morphine  without issue   Shellfish Allergy Shortness Of Breath and Rash    Only shrimp allergy   Aggrenox [Aspirin -Dipyridamole Er] Nausea And Vomiting and Other (See Comments)    Headache    Codeine Nausea And Vomiting   Doxycycline  Monohydrate Diarrhea   Cephalexin Itching and Rash   Codeine Phosphate Nausea And Vomiting   Contrast Media [Iodinated Contrast Media] Nausea And Vomiting   Hydrocodone  Nausea And Vomiting and Other (See Comments)    Can take morphine  without issue   Meperidine  Hcl Nausea And Vomiting   Percocet [Oxycodone -Acetaminophen ] Nausea And Vomiting and Other (See Comments)    Can take morphine  without issue     CURRENT MEDICATIONS:  Outpatient Encounter Medications as of 03/26/2024  Medication Sig   amLODipine  (NORVASC ) 10 MG tablet TAKE 1 TABLET BY MOUTH EVERY DAY   atorvastatin  (LIPITOR) 40 MG tablet TAKE 1 TABLET BY MOUTH EVERY DAY   butalbital -acetaminophen -caffeine  (FIORICET ) 50-325-40 MG tablet TAKE 1 TABLET BY MOUTH EVERY 6 HOURS AS NEEDED FOR  HEADACHE   clopidogrel  (PLAVIX ) 75 MG tablet TAKE 1 TABLET (75 MG TOTAL) BY MOUTH DAILY. *LABS REQUIRED FOR FUTURE REFILLS*   ketoconazole  (NIZORAL ) 2 % cream Apply 1 Application topically 2 (two) times daily.   levothyroxine  (SYNTHROID ) 125 MCG tablet TAKE 1 TABLET BY MOUTH EVERY DAY BEFORE BREAKFAST   losartan  (COZAAR ) 100 MG tablet TAKE 1 TABLET BY MOUTH EVERY DAY   metoprolol  succinate (TOPROL -XL) 25 MG 24 hr tablet TAKE 1 TABLET BY MOUTH EVERY DAY.   potassium chloride  (KLOR-CON  10) 10 MEQ tablet Take 1 tablet (10 mEq total) by mouth 2 (two) times daily. Take 2 tab twice daily for 5 days   traMADol  (ULTRAM ) 50 MG tablet TAKE 1 TO 2 TABLETS BY MOUTH EVERY 4 TO 6 HOURS AS NEEDED FOR PAIN   No facility-administered encounter medications on file as of 03/26/2024.     ONCOLOGIC FAMILY HISTORY:  Family History  Problem Relation Age of Onset   Arrhythmia Mother    Hypertension Mother    Stroke Mother    Prostate cancer Father 69 - 8       metastatic   Breast cancer Sister        53s   Breast cancer Sister        43s   Stroke Maternal Grandmother    Diabetes Paternal Grandmother    Brain cancer Cousin        dx. >50     GENETIC COUNSELING/TESTING: ***  SOCIAL HISTORY:  Dayleen G Merkey is /single/married/divorced/widowed/separated and lives alone/with her spouse/family/friend in (city), [place] .  She has (#) children and they live in (city).  Ms. Laine is currently retired/disabled/working part-time/full-time as ***.  She denies any current or history of tobacco, alcohol , or illicit drug use.     PHYSICAL EXAMINATION:  Vital Signs:  There were no vitals filed for this visit. There were no vitals filed for this visit. General: Well-nourished, well-appearing female in no acute distress.  She is unaccompanied/accompanied in clinic by her ***** today.   HEENT: Head is normocephalic.  Pupils equal and reactive to light. Conjunctivae clear without exudate.  Sclerae anicteric. Oral  mucosa is pink, moist.  Oropharynx is pink without lesions or erythema.  Lymph: No cervical, supraclavicular, or infraclavicular lymphadenopathy noted on palpation.  Cardiovascular: Regular rate and rhythm.SABRA Respiratory: Clear to auscultation bilaterally. Chest expansion symmetric; breathing non-labored.  GI: Abdomen soft and round; non-tender, non-distended. Bowel sounds normoactive.  GU: Deferred.  Neuro: No focal deficits. Steady gait.  Psych: Mood and affect normal and appropriate for situation.  Extremities: No edema. MSK: No focal spinal tenderness to palpation.  Full range of motion in bilateral upper extremities Skin: Warm and dry.  LABORATORY DATA:  None for this visit.  DIAGNOSTIC IMAGING:  None for this visit.      ASSESSMENT AND PLAN:  Ms.. Skyles is a pleasant 78 y.o. female with Stage *** right/left breast invasive ductal carcinoma, ER+/PR+/HER2-, diagnosed in (date), treated with lumpectomy, adjuvant radiation therapy, and anti-estrogen therapy with *** beginning in (date).  She presents to the Survivorship Clinic for our initial meeting and routine follow-up post-completion of treatment for breast cancer.    1. Stage *** right/left breast cancer:  Ms. Altamura is continuing to recover from definitive treatment for breast cancer. She will follow-up with her medical oncologist, Dr. Reine in (month) /2017 with history and physical exam per surveillance protocol.  She will continue her anti-estrogen therapy with (drug). Thus far, she is tolerating the *** well, with minimal side effects. She was instructed to make Dr. Gudena or myself aware if she begins to experience any worsening side effects of the medication and I could see her back in clinic to help manage those side effects, as needed. Though the incidence is low, there is an associated risk of endometrial cancer with anti-estrogen therapies like Tamoxifen.  Ms. Braaten was encouraged to contact Dr. Dorethia or  myself with any vaginal bleeding while taking Tamoxifen. Other side effects of Tamoxifen were again reviewed with her as well. Today, a comprehensive survivorship care plan and treatment summary was reviewed with the patient today detailing her breast cancer diagnosis, treatment course, potential late/long-term effects of treatment, appropriate follow-up care with recommendations for the future, and patient education resources.  A copy of this summary, along with a letter will be sent to the patient's primary care provider via mail/fax/In Basket message after today's visit.    #. Problem(s) at Visit______________  #. Bone health:  Given Ms. Counts age/history of breast cancer and her current treatment regimen including anti-estrogen therapy with _______, she is at risk for bone demineralization.  Her last DEXA scan was **/**/20**, which showed (results).***  In the meantime, she was encouraged to increase her consumption of foods rich in calcium , as well as increase her weight-bearing activities.  She was given education on specific activities to promote bone health.  #. Cancer screening:  Due to Ms. Maske's history and her age, she should receive screening for skin cancers, colon cancer, and gynecologic cancers.  The information and recommendations are listed on the patient's comprehensive care plan/treatment summary and were reviewed in detail with the patient.    #. Health maintenance and wellness promotion: Ms. Pacey was encouraged to consume 5-7 servings of fruits and vegetables per day. We reviewed the [employer] handout, as well as the handout Take Control of Your Health and Reduce Your Cancer Risk from the [employer].  She was also encouraged to engage in moderate to vigorous exercise for 30 minutes per day most days of the week. We discussed the [employer] fitness program, which is designed for cancer survivors to help them become more physically fit after cancer  treatments.  She was instructed to limit her alcohol  consumption and continue to abstain from tobacco use/***was encouraged stop smoking.     #. Support services/counseling: It is not uncommon for this period of the patient's cancer care trajectory to be one of  many emotions and stressors.  We discussed an opportunity for her to participate in the next session of FYNN (Finding Your New Normal) support group series designed for patients after they have completed treatment.   Ms. Ammar was encouraged to take advantage of our many other support services programs, support groups, and/or counseling in coping with her new life as a cancer survivor after completing anti-cancer treatment.  She was offered support today through active listening and expressive supportive counseling.  She was given information regarding our available services and encouraged to contact me with any questions or for help enrolling in any of our support group/programs.    Dispo:   -Return to cancer center ***  -Mammogram due in *** -Follow up with surgery *** -She is welcome to return back to the Survivorship Clinic at any time; no additional follow-up needed at this time.  -Consider referral back to survivorship as a long-term survivor for continued surveillance  A total of (30) minutes of face-to-face time was spent with this patient with greater than 50% of that time in counseling and care-coordination.   Morrie Daywalt, NP Survivorship Program [facility] [phone]   Note: PRIMARY CARE PROVIDER Johnny Garnette LABOR, MD [phone]

## 2024-03-26 ENCOUNTER — Encounter: Payer: Self-pay | Admitting: *Deleted

## 2024-03-26 ENCOUNTER — Other Ambulatory Visit: Payer: Self-pay

## 2024-03-26 ENCOUNTER — Inpatient Hospital Stay: Payer: Medicare PPO | Attending: Nurse Practitioner | Admitting: Nurse Practitioner

## 2024-03-26 DIAGNOSIS — Z170 Estrogen receptor positive status [ER+]: Secondary | ICD-10-CM | POA: Insufficient documentation

## 2024-03-26 DIAGNOSIS — Z923 Personal history of irradiation: Secondary | ICD-10-CM | POA: Insufficient documentation

## 2024-03-26 DIAGNOSIS — D0512 Intraductal carcinoma in situ of left breast: Secondary | ICD-10-CM | POA: Insufficient documentation

## 2024-03-26 NOTE — Progress Notes (Signed)
 Called patient due to not showing up for her 1130 app with Lacie Burton NP. Unable to speak to anyone so I left a message on her VM stating we would send a message to scheduling to have it rescheduled. Will no show patient for app today.

## 2024-04-03 ENCOUNTER — Other Ambulatory Visit: Payer: Self-pay | Admitting: Family Medicine

## 2024-04-03 DIAGNOSIS — E7800 Pure hypercholesterolemia, unspecified: Secondary | ICD-10-CM

## 2024-04-08 ENCOUNTER — Ambulatory Visit: Admitting: Neurology

## 2024-04-08 ENCOUNTER — Other Ambulatory Visit: Payer: Self-pay | Admitting: Family Medicine

## 2024-04-08 ENCOUNTER — Encounter: Payer: Self-pay | Admitting: Neurology

## 2024-04-08 VITALS — BP 131/77 | HR 51 | Ht 65.5 in | Wt 196.6 lb

## 2024-04-08 DIAGNOSIS — R269 Unspecified abnormalities of gait and mobility: Secondary | ICD-10-CM

## 2024-04-08 DIAGNOSIS — R799 Abnormal finding of blood chemistry, unspecified: Secondary | ICD-10-CM | POA: Diagnosis not present

## 2024-04-08 DIAGNOSIS — Z8673 Personal history of transient ischemic attack (TIA), and cerebral infarction without residual deficits: Secondary | ICD-10-CM | POA: Diagnosis not present

## 2024-04-08 DIAGNOSIS — Z79899 Other long term (current) drug therapy: Secondary | ICD-10-CM | POA: Diagnosis not present

## 2024-04-08 DIAGNOSIS — R200 Anesthesia of skin: Secondary | ICD-10-CM

## 2024-04-08 DIAGNOSIS — Z0001 Encounter for general adult medical examination with abnormal findings: Secondary | ICD-10-CM | POA: Diagnosis not present

## 2024-04-08 NOTE — Progress Notes (Signed)
 [facility] [address] [place]. [place] [address] [phone]       OFFICE FOLLOW-UP NOTE  Abigail [name] Date of Birth:  1945/11/29 Medical Record Number:  [id number]   HPI: Update 09/27/2021 Abigail [name] is a 78 year old African-American lady with longstanding history of a multifactorial gait and balance difficulties due to combination of old right pontine and cerebellar strokes as well as left thalamic strokes.  Vascular risk factors of hypertension, hyperlipidemia, history of sleep apnea and multiple strokes.  She had seen me in the clinic several times with last visit being on 04/01/2019.  She returns today for worsening of her existing symptoms.  For the last 9 months she has noticed that her balance is difficult.  She tires easily.  She has been using a cane almost constantly.  She still feels that she is starting to 1 side and not sure of her balance.  She does have chronic low back pain and right hip pain.  She has seen Dr. Unice in the past she had refused back surgery.  She continues to have severe right hip and thigh pain which also limits her walking.  She recently had significant elevation in the blood pressure and saw her cardiologist who increased her medications.  She denies any tingling numbness or pain or burning in her feet.  She however had an episode of gout and painful swollen toes for which she saw her primary physician who gave her a course of steroids which seems to have helped.  She has not had any recent visits with her orthopedic surgeon or with Dr. Unice her back surgeon.  Review of electronic medical records show that she had lab work on 08/20/2021 which showed LDL cholesterol of 82 mg percent.  Her last hemoglobin A1c was on 11/24/20) 6.1.  Last carotid ultrasound was on 04/06/2019 and was unremarkable. Initial Consult 06/03/2015 :  Abigail [name] is a 42 year pleasant lady whose had five-month history of gait and balance difficulties. She just feels she often leans to  the left and stumbles if she is not careful. She feels the symptoms began suddenly one day when she noticed trouble with balance and walking she also had some trouble swallowing but she did not seek immediate help at that time. She does have a prior history of her left thalamic lacunar infarct in 2007 for which she had seen me. That was felt to be due to small vessel disease and she was started on Aggrenox which did not tolerate due to side effects. She was lost to medical follow-up to me. She is recently had an outpatient MRI scan of the brain which have personally reviewed on 05/02/15 which does show a right pontine and cerebellar hyperintensity which is likely a age indeterminate lacunar infarcts which were not noticed on the previous MRI and perhaps may be the explanation of her recent dizziness and gait difficulties. The patient is currently on Plavix  which is tolerating well without bleeding or bruising. She states her blood pressure has been difficult to control and today it is elevated at 171/97 in office. She did also had some recent chest pain or shortness of breath for which she underwent cardiac catheterization last Monday which did not show any correctable lesion. Dr. Pietro for cardiologist has also added a baby aspirin  and she seems to be having increased bruising tendency since then. She's not sure when her last lipid profile was checked but she does take Lipitor 40 mg daily which is  tolerating well without side effects. She does have history of sleep apnea and had surgery for that and does not use CPAP Update 11/17/15 : She returns for follow-up after last visit 5 months ago. She is accompanied by husband. Patient states she was doing fine until a few weeks ago since then she's noticed intermittent tingling in the left hand as well as weakness and dropping of objects from that hand. She's also noticed some walking difficulty and having to drag her left leg. She denies any slurred speech, headache,  fall or injury. She was seen by me in September of last year and at that time MRI scan of the brain prior to that visit had shown small remote age lacunar infarcts in the right cerebellum and pons. I had ordered MRA of the neck and brain which have personally reviewed done on 06/03/15 which showed no significant intra-or extracranial stenosis. Lab work on 06/03/15 showed LDL cholesterol 84 but HDL of 77 with the ratio of 2.3. Hemoglobin A1c was borderline at 6.0. Patient has been on Plavix  since 2006 following her previous stroke. She was unable to tolerate Aggrenox at that time. Patient states she is having gradual worsening of her balance and walking since spring of last year and perhaps had a small stroke at that time which was not recognized. She states she is tolerating Plavix  well without bleeding or bruising. Her blood pressure is well controlled and today it is 139/76. The patient is refusing a referral to physical outpatient therapy but she wants to go and participate in the Silver sneakers program at the [facility]. She is tolerating Lipitor without myalgias or arthralgias. She does use a CPAP for sleep apnea. Update 05/19/2017 ; she returns for follow-up after last visit for year and a half ago. She however was admitted to the hospital in June 2018 with a right lip and hand paresthesias. MRI scan did show a small left thalamic lacunar infarct. MRA of the brain was unremarkable. Carotid Doppler showed no significant extracranial stenosis. Transthoracic echo showed normal ejection fraction. LDL cholesterol was borderline at 72 mg percent. Hemoglobin A1c was 6.1. Patient was started on aspirin  81 and Plavix  75 mg per 3 weeks and since then she has discontinued aspirin  and is currently on Plavix  alone. She is tolerating well without bruising or bleeding. She states her right lip numbness has resolved but right little finger in [place] persists. She has seen Dr. Unice for back pain and he has discontinued both) and  replace it with Aleve and tramadol . Patient also complains of subjective mild memory difficulties since her stroke. She has trouble remembering recent information  and names. Update 10/31/2018 : She returns for follow-up after last visit a year and half ago.  She is accompanied by her husband.  Patient is doing well from stroke standpoint without recurrent stroke or TIA symptoms.  She states she had hip replacement surgery in December 2019 and a few days later developed leg pain and swelling and was diagnosed as femoral vein deep vein thrombosis.  She has since stopped Plavix  and has been switched to Xarelto  which she seems to be tolerating well without bleeding or bruising.  She states her blood pressure is under good control though today it is slightly elevated at 1 four 8/83.  She is tolerating Lipitor well without any side effects.  Her last lipid profile was satisfactory.  She has no new neurological complaints.  She is walking with a cane and has had no falls or  injuries.  She states her mild subjective memory difficulties are unchanged and are not getting worse Update 04/01/2019 : She returns for follow-up after last visit 5 months ago.  She has not had any recurrent stroke or TIA symptoms.  She continues to have back pain and hip pain and walking difficulties mostly related to her bad hip.  She had hip replacement surgery in December but has had persistent pain.  She recently underwent epidural back injection by orthopedic surgeon without significant relief.  She complains of difficulty swallowing for the last 1 month which is intermittent and occurs once or twice a week.  This is mainly when she is trying to swallow liquids.  She initially has trouble initiating it but once she can swallow the fluid goes down well.  She denies any nasal regurgitation coughing or gagging.  She is tolerating Plavix  well with only minor bruising and no bleeding.  Her blood pressures well controlled and today it is 140/78.  She  remains on Lipitor which is tolerating well and states lipid profile was checked only last year was satisfactory.  She has not had follow-up carotid ultrasound and for more than a year.  Update 02/15/2022 : She returns for follow-up after last visit 5 months ago.  Patient continues to have gait and balance difficulties which are unchanged.  She is using a cane now mostly all the time she has had a couple of near falls 2.  She fell once on the gravel but luckily did not have any major injuries.  She did undergo EMG nerve conduction study on 11/25/2021 which was normal and did not show any underlying neuropathy or radiculopathy.  MRI scan of the brain on 10/21/2021 showed no acute abnormalities.  Extensive changes of small vessel disease were noted.  MR angiogram of the brain and neck did not reveal any major large vessel stenosis or occlusion.  She will develop COVID in April and was symptomatic for 3 to 4 days and was treated as an outpatient with primary care physician.  She remains on Plavix  which is tolerating well with only easy bruising but no bleeding episodes.  Her blood pressures under good control and today it is 132/81.  She remains on Lipitor which is tolerating well without muscle aches and pains and last lipid profile on 09/29/2021 showed LDL cholesterol 92 mg percent.  Hemoglobin A1c was 5.9.  She has no other new complaints.  She has had no recurrent stroke or TIA symptoms. Update 01/17/2023 : She returns for follow-up after last visit a year ago.  Patient continues to do well from neurovascular standpoint.  Has not had recurrent stroke or TIA symptoms.  Remains on Plavix  which is tolerating well without significant bruising or bleeding.  Blood pressure is well-controlled on Norvasc  and Cozaar  and Toprol .  He is tolerating Lipitor well without side effects.  Lipid profile on 12/27/2022 showed LDL cholesterol to be 83 mg percent.  Hemoglobin A1c was borderline at 6.5.  Patient continues G[place] difficulties  mostly related to chronic back and hip pain.  He is also complaining of sciatica in both legs.  He has been evaluated by orthopedic surgeon Dr. Davie had an MRI of the spine and he has an appointment to see him today to discuss the results available for me today.  Possibly be referred to Dr. Cyrena for spine injections as she is reluctant to have any surgery. Update 04/08/2024 : She returns for follow-up after last visit 15 months ago.  Patient  had called office a few months ago complaining of shooting temporal pain and saw her primary care physician who ordered CT scan of the head on 02/13/2024 which was normal.  CT scan of cervical spine showed spondylitic changes at C6-7.  Patient states that visit headaches have since resolved.  She also had an episode of transient heaviness in the right upper extremity a month ago which lasted a day and a half and resolved.  She also subsequently had some tingling around the right lip and fingertips on for few days which is also resolved.  Patient admits that she was under significant stress at this time.  She had recently been diagnosed with breast cancer in last October and underwent surgery in November.  Her husband was also diagnosed with cancer.  When her husband was diagnosed with a recurrence of his cancer she was quite stressed out and that is the time she has the symptoms.  Her sister also died in December 25, 2023.  Patient continues to have gait and balance difficulties.  She uses a cane quite regularly with it.  She has had no falls or injuries.  She did have back surgery last year.  She has had no definite recurrent stroke or TIA symptoms.  She remains on Plavix  she is tolerating well without bruising or bleeding.  She states her blood pressures are in good control.  She is tolerating Lipitor well without muscle aches and pains.  She has had no recent lab work or carotid ultrasound done. ROS:   14 system review of systems is positive for back pain, sciatica bruising, sore  throat, hip pain, thigh pain, difficulty walking, imbalance, back pain, gout, trouble swallowing, headache all other systems negative  PMH:  Past Medical History:  Diagnosis Date   Breast cancer (HCC)    Bronchitis 09/2016   Bruises easily    DDD (degenerative disc disease)    Degenerative joint disease of cervical spine 09/05/2011   Cervival area, now some osteoarthritis-lower back and Rt. shoulder   Depression    Diverticulitis 09/05/2011   hx. gastritis, diverticulitis x2 -now surgery planned   DIVERTICULITIS, ACUTE 02/10/2010   Dyspnea    with exertion   Essential hypertension 09/05/2011   tx. Verapamil    GROIN PAIN 09/21/2010   Headache(784.0)    headaches are better   Hearing loss    right ear   Heart palpitations    Hemorrhoids    History of bronchitis    History of colonic polyps    History of pneumonia    History of tension headache    Hyperlipidemia    Hypertension    Hypothyroidism 09/05/2011   Supplement used   Late effect of adverse effect of drug, medicinal or biological substance    NECK PAIN, ACUTE 09/21/2010   Neuromuscular disorder (HCC)    carpal tunnel in both hands   Nocturia    Osteoarthritis 112-17-12   spine and rt. hip, rt. shoulder   Patent foramen ovale    Small - unable to be closed -    Pneumonia    PONV (postoperative nausea and vomiting)    Sleep apnea 09/05/2011   no cpap ever, had surgery to remove cartilage, no problems now   SORE THROAT 04/30/2009   Stroke (HCC) 09/05/2011   2006/2009-(loss of memory, balance issues remains occ.)   TIA 09/28/2007, 10/2014   Unstable angina (HCC)    a. 05/2010 Cath: nl cors, EF 55%;  b. 05/2015 Lexiscan  MV: small, severe, fixed apical  defect and a small, severe, reversible inf lateral defect w/ apical thinning and mild ischemia, EF 54%.   URI 09/02/2008   Vertigo     Social History:  Social History   Socioeconomic History   Marital status: Married    Spouse name: Not on file   Number of  children: 3   Years of education: Not on file   Highest education level: Not on file  Occupational History   Not on file  Tobacco Use   Smoking status: Former    Current packs/day: 0.00    Average packs/day: 0.5 packs/day for 5.0 years (2.5 ttl pk-yrs)    Types: Cigarettes    Start date: 09/20/1963    Quit date: 09/19/1968    Years since quitting: 55.5   Smokeless tobacco: Never  Vaping Use   Vaping status: Never Used  Substance and Sexual Activity   Alcohol  use: Yes    Alcohol /week: 2.0 standard drinks of alcohol     Types: 1 Cans of beer, 1 Shots of liquor per week    Comment: rarely   Drug use: No   Sexual activity: Yes  Other Topics Concern   Not on file  Social History Narrative   Lives in [place] with her husband.  She does not routinely exercise.   Social Drivers of Corporate investment banker Strain: Low Risk  (03/15/2024)   Overall Financial Resource Strain (CARDIA)    Difficulty of Paying Living Expenses: Not hard at all  Food Insecurity: No Food Insecurity (03/15/2024)   Hunger Vital Sign    Worried About Running Out of Food in the Last Year: Never true    Ran Out of Food in the Last Year: Never true  Transportation Needs: No Transportation Needs (03/15/2024)   PRAPARE - Administrator, Civil Service (Medical): No    Lack of Transportation (Non-Medical): No  Physical Activity: Inactive (03/15/2024)   Exercise Vital Sign    Days of Exercise per Week: 0 days    Minutes of Exercise per Session: 0 min  Stress: No Stress Concern Present (03/15/2024)   [employer] of Occupational Health - Occupational Stress Questionnaire    Feeling of Stress: Not at all  Social Connections: Socially Integrated (03/15/2024)   Social Connection and Isolation Panel    Frequency of Communication with Friends and Family: More than three times a week    Frequency of Social Gatherings with Friends and Family: More than three times a week    Attends Religious Services: More than  4 times per year    Active Member of [employer] or Organizations: Yes    Attends Banker Meetings: More than 4 times per year    Marital Status: Married  Catering manager Violence: Not At Risk (03/15/2024)   Humiliation, Afraid, Rape, and Kick questionnaire    Fear of Current or Ex-Partner: No    Emotionally Abused: No    Physically Abused: No    Sexually Abused: No    Medications:   Current Outpatient Medications on File Prior to Visit  Medication Sig Dispense Refill   amLODipine  (NORVASC ) 10 MG tablet TAKE 1 TABLET BY MOUTH EVERY DAY 90 tablet 1   atorvastatin  (LIPITOR) 40 MG tablet TAKE 1 TABLET BY MOUTH EVERY DAY 30 tablet 1   butalbital -acetaminophen -caffeine  (FIORICET ) 50-325-40 MG tablet TAKE 1 TABLET BY MOUTH EVERY 6 HOURS AS NEEDED FOR HEADACHE 60 tablet 5   clopidogrel  (PLAVIX ) 75 MG tablet TAKE 1 TABLET (75 MG TOTAL)  BY MOUTH DAILY. *LABS REQUIRED FOR FUTURE REFILLS* 90 tablet 1   ketoconazole  (NIZORAL ) 2 % cream Apply 1 Application topically 2 (two) times daily. 30 g 2   levothyroxine  (SYNTHROID ) 125 MCG tablet TAKE 1 TABLET BY MOUTH EVERY DAY BEFORE BREAKFAST 90 tablet 1   losartan  (COZAAR ) 100 MG tablet TAKE 1 TABLET BY MOUTH EVERY DAY 90 tablet 2   metoprolol  succinate (TOPROL -XL) 25 MG 24 hr tablet TAKE 1 TABLET BY MOUTH EVERY DAY. 90 tablet 1   potassium chloride  (KLOR-CON  10) 10 MEQ tablet Take 1 tablet (10 mEq total) by mouth 2 (two) times daily. Take 2 tab twice daily for 5 days 20 tablet 0   traMADol  (ULTRAM ) 50 MG tablet TAKE 1 TO 2 TABLETS BY MOUTH EVERY 4 TO 6 HOURS AS NEEDED FOR PAIN 120 tablet 5   No current facility-administered medications on file prior to visit.    Allergies:   Allergies  Allergen Reactions   Dilaudid  [Hydromorphone  Hcl] Shortness Of Breath, Nausea And Vomiting and Other (See Comments)    Able to take morphine  without issue   Shellfish Allergy Shortness Of Breath and Rash    Only shrimp allergy   Aggrenox [Aspirin -Dipyridamole  Er] Nausea And Vomiting and Other (See Comments)    Headache    Codeine Nausea And Vomiting   Doxycycline  Monohydrate Diarrhea   Cephalexin Itching and Rash   Codeine Phosphate Nausea And Vomiting   Contrast Media [Iodinated Contrast Media] Nausea And Vomiting   Hydrocodone  Nausea And Vomiting and Other (See Comments)    Can take morphine  without issue   Meperidine  Hcl Nausea And Vomiting   Percocet [Oxycodone -Acetaminophen ] Nausea And Vomiting and Other (See Comments)    Can take morphine  without issue    Physical Exam  Vitals:   04/08/24 1247  BP: 131/77  Pulse: (!) 51   General: Mildly obese elderly African-American lady , seated, in no evident distress Head: head normocephalic and atraumatic.   Neck: supple with no carotid or supraclavicular bruits Cardiovascular: regular rate and rhythm, no murmurs Musculoskeletal: no deformity Skin:  no rash/petichiae Vascular:  Normal pulses all extremities   Neurologic Exam Mental Status: Awake and fully alert. Oriented to place and time. Recent and remote memory intact. Attention span, concentration and fund of knowledge appropriate. Mood and affect appropriate.  Cranial Nerves: Fundoscopic exam not done. Pupils equal, briskly reactive to light. Extraocular movements full without nystagmus. Visual fields full to confrontation. Hearing intact. Facial sensation intact. Face, tongue, palate moves normally and symmetrically.  Motor: Normal bulk and tone.Mild weakness of left grip and intrinsic hand muscles. Orbits right over left upper extremity. Mild weakness of right hip flexors, extensors and adductor's due to pain,   Sensory.: intact to touch , pinprick , position and vibratory sensation. Mild subjective paresthesias in the right little finger but no objective sensory loss Coordination: Rapid alternating movements normal in all extremities. Finger-to-nose and heel-to-shin performed accurately bilaterally. Gait and Station: Arises from  chair without difficulty. Stance is slightly broad-based Gait demonstrates favoring of the right hip due to pain and stiffness and dragging of the right leg.. Unable to heel, toe and tandem walk without difficulty.  She uses a cane for walking.She is offbalance whi;le turning Reflexes: 1+ and symmetric. Toes downgoing.         ASSESSMENT: 78 year old lady with 3-year history of worsening chronic multifactorial gait and balance difficulties following remote right pontine and cerebellar infarcts in 2016 as well as left thalamic infarct in  2007 which appeared to have worsened the last year and a half likely due to progressive degenerative spine disease     PLAN:I had a long discussion with the patient regarding her gait imbalance and fall risk and answered questions. .  I recommend she stay on Plavix  for stroke prevention and maintain aggressive risk factor modification with strict control of hypertension with blood pressure goal below 130/90, lipids with LDL cholesterol goal below 70 mg percent and diabetes with hemoglobin A1c goal below 6.5%.  We also discussed fall prevention precautions and encouraged to use a cane at all times and to consider using a walker while walking outdoors for long distances.  .  Check screening carotid ultrasound study, lipid panel and HbA1c.Abigail [name]  She will return for follow-up in the future in a year or call earlier if necessary. Greater than 50% of time during this 35-minute visit was spent on counseling,explanation of diagnosis, planning of further management, discussion with patient and family and coordination of care Eather Popp MD Note: This document was prepared with digital dictation and possible smart phrase technology. Any transcriptional errors that result from this process are unintentional

## 2024-04-08 NOTE — Patient Instructions (Signed)
 I had a long discussion with the patient regarding her gait imbalance and fall risk and answered questions. .  I recommend she stay on Plavix  for stroke prevention and maintain aggressive risk factor modification with strict control of hypertension with blood pressure goal below 130/90, lipids with LDL cholesterol goal below 70 mg percent and diabetes with hemoglobin A1c goal below 6.5%.  We also discussed fall prevention precautions and encouraged to use a cane at all times and to consider using a walker while walking outdoors for long distances.  .  Check screening carotid ultrasound study, lipid panel and HbA1c.SABRA  She will return for follow-up in the future in a year or call earlier if necessary.  Fall Prevention in the Home, Adult Falls can cause injuries and affect people of all ages. There are many simple things that you can do to make your home safe and to help prevent falls. If you need it, ask for help making these changes. What actions can I take to prevent falls? General information Use good lighting in all rooms. Make sure to: Replace any light bulbs that burn out. Turn on lights if it is dark and use night-lights. Keep items that you use often in easy-to-reach places. Lower the shelves around your home if needed. Move furniture so that there are clear paths around it. Do not keep throw rugs or other things on the floor that can make you trip. If any of your floors are uneven, fix them. Add color or contrast paint or tape to clearly mark and help you see: Grab bars or handrails. First and last steps of staircases. Where the edge of each step is. If you use a ladder or stepladder: Make sure that it is fully opened. Do not climb a closed ladder. Make sure the sides of the ladder are locked in place. Have someone hold the ladder while you use it. Know where your pets are as you move through your home. What can I do in the bathroom?     Keep the floor dry. Clean up any water that  is on the floor right away. Remove soap buildup in the bathtub or shower. Buildup makes bathtubs and showers slippery. Use non-skid mats or decals on the floor of the bathtub or shower. Attach bath mats securely with double-sided, non-slip rug tape. If you need to sit down while you are in the shower, use a non-slip stool. Install grab bars by the toilet and in the bathtub and shower. Do not use towel bars as grab bars. What can I do in the bedroom? Make sure that you have a light by your bed that is easy to reach. Do not use any sheets or blankets on your bed that hang to the floor. Have a firm bench or chair with side arms that you can use for support when you get dressed. What can I do in the kitchen? Clean up any spills right away. If you need to reach something above you, use a sturdy step stool that has a grab bar. Keep electrical cables out of the way. Do not use floor polish or wax that makes floors slippery. What can I do with my stairs? Do not leave anything on the stairs. Make sure that you have a light switch at the top and the bottom of the stairs. Have them installed if you do not have them. Make sure that there are handrails on both sides of the stairs. Fix handrails that are broken or loose. Make  sure that handrails are as long as the staircases. Install non-slip stair treads on all stairs in your home if they do not have carpet. Avoid having throw rugs at the top or bottom of stairs, or secure the rugs with carpet tape to prevent them from moving. Choose a carpet design that does not hide the edge of steps on the stairs. Make sure that carpet is firmly attached to the stairs. Fix any carpet that is loose or worn. What can I do on the outside of my home? Use bright outdoor lighting. Repair the edges of walkways and driveways and fix any cracks. Clear paths of anything that can make you trip, such as tools or rocks. Add color or contrast paint or tape to clearly mark and help  you see high doorway thresholds. Trim any bushes or trees on the main path into your home. Check that handrails are securely fastened and in good repair. Both sides of all steps should have handrails. Install guardrails along the edges of any raised decks or porches. Have leaves, snow, and ice cleared regularly. Use sand, salt, or ice melt on walkways during winter months if you live where there is ice and snow. In the garage, clean up any spills right away, including grease or oil spills. What other actions can I take? Review your medicines with your health care provider. Some medicines can make you confused or feel dizzy. This can increase your chance of falling. Wear closed-toe shoes that fit well and support your feet. Wear shoes that have rubber soles and low heels. Use a cane, walker, scooter, or crutches that help you move around if needed. Talk with your provider about other ways that you can decrease your risk of falls. This may include seeing a physical therapist to learn to do exercises to improve movement and strength. Where to find more information Centers for Disease Control and Prevention, STEADI: TonerPromos.no [employer] on Aging: BaseRingTones.pl National Institute on Aging: BaseRingTones.pl Contact a health care provider if: You are afraid of falling at home. You feel weak, drowsy, or dizzy at home. You fall at home. Get help right away if you: Lose consciousness or have trouble moving after a fall. Have a fall that causes a head injury. These symptoms may be an emergency. Get help right away. Call 911. Do not wait to see if the symptoms will go away. Do not drive yourself to the hospital. This information is not intended to replace advice given to you by your health care provider. Make sure you discuss any questions you have with your health care provider. Document Revised: 05/09/2022 Document Reviewed: 05/09/2022 Elsevier Patient Education  2024 [employer].

## 2024-04-09 LAB — LIPID PANEL
Chol/HDL Ratio: 2.3 ratio (ref 0.0–4.4)
Cholesterol, Total: 184 mg/dL (ref 100–199)
HDL: 80 mg/dL (ref >39)
LDL Chol Calc (NIH): 86 mg/dL (ref 0–99)
Triglycerides: 99 mg/dL (ref 0–149)
VLDL Cholesterol Cal: 18 mg/dL (ref 5–40)

## 2024-04-09 LAB — HEMOGLOBIN A1C
Est. average glucose Bld gHb Est-mCnc: 128 mg/dL
Hgb A1c MFr Bld: 6.1 % — ABNORMAL HIGH (ref 4.8–5.6)

## 2024-04-10 ENCOUNTER — Ambulatory Visit: Payer: Self-pay | Admitting: Neurology

## 2024-04-10 ENCOUNTER — Other Ambulatory Visit: Payer: Self-pay | Admitting: Neurology

## 2024-04-10 DIAGNOSIS — E7800 Pure hypercholesterolemia, unspecified: Secondary | ICD-10-CM

## 2024-04-10 MED ORDER — ATORVASTATIN CALCIUM 40 MG PO TABS: 80.0000 mg | ORAL_TABLET | Freq: Every day | ORAL | 1 refills | Status: DC

## 2024-04-10 NOTE — Progress Notes (Signed)
 Kindly inform the patient that cholesterol profile is still not satisfactory with a bad cholesterol being slightly high so I recommend she increase the dose of Lipitor to 80 mg daily.  Screening test for diabetes is borderline but improved from a year ago.

## 2024-04-11 MED ORDER — ATORVASTATIN CALCIUM 80 MG PO TABS: 80.0000 mg | ORAL_TABLET | Freq: Every day | ORAL | 3 refills | Status: AC

## 2024-04-11 NOTE — Telephone Encounter (Signed)
 Called and spoke to pt and stated to use the 40 and take 2 tabs daily until she runs out and sent in 80mg  of lipitor as she requested

## 2024-04-11 NOTE — Telephone Encounter (Signed)
 Pt called to discuss the increase for her atorvastatin  (LIPITOR) 40 MG tablet

## 2024-04-15 ENCOUNTER — Inpatient Hospital Stay (HOSPITAL_BASED_OUTPATIENT_CLINIC_OR_DEPARTMENT_OTHER): Admitting: Nurse Practitioner

## 2024-04-15 ENCOUNTER — Encounter: Payer: Self-pay | Admitting: Nurse Practitioner

## 2024-04-15 VITALS — BP 136/70 | HR 51 | Temp 97.8°F | Resp 17 | Wt 194.9 lb

## 2024-04-15 DIAGNOSIS — Z170 Estrogen receptor positive status [ER+]: Secondary | ICD-10-CM

## 2024-04-15 DIAGNOSIS — Z923 Personal history of irradiation: Secondary | ICD-10-CM | POA: Diagnosis not present

## 2024-04-15 DIAGNOSIS — C50912 Malignant neoplasm of unspecified site of left female breast: Secondary | ICD-10-CM | POA: Insufficient documentation

## 2024-04-15 DIAGNOSIS — D0512 Intraductal carcinoma in situ of left breast: Secondary | ICD-10-CM

## 2024-04-15 DIAGNOSIS — C50922 Malignant neoplasm of unspecified site of left male breast: Secondary | ICD-10-CM

## 2024-04-15 NOTE — Progress Notes (Signed)
 CLINIC:  Survivorship   REASON FOR VISIT:  Routine follow-up post-treatment for a recent history of breast cancer.  BRIEF ONCOLOGIC HISTORY:  Oncology History  Ductal carcinoma in situ (DCIS) of left breast  07/03/2023 Initial Diagnosis   Ductal carcinoma in situ (DCIS) of left breast    Genetic Testing   [facility] CancerNext+RNA Panel was Negative. Report date is 07/25/2023.   The CancerNext gene panel offered by [employer] includes sequencing, rearrangement analysis, and RNA analysis for the following 34 genes:  APC, ATM, AXIN2, BARD1, BMPR1A, BRCA1, BRCA2, BRIP1, CDH1, CDK4, CDKN2A, CHEK2, DICER1, HOXB13, EPCAM, GREM1, MLH1, MSH2, MSH3, MSH6, MUTYH, NF1, NTHL1, PALB2, PMS2, POLD1, POLE, PTEN, RAD51C, RAD51D, SMAD4, SMARCA4, STK11, and TP53.    08/02/2023 Cancer Staging   Staging form: Breast, AJCC 8th Edition - Pathologic stage from 08/02/2023: Stage Unknown (pTis (DCIS), pNX, cM0, G2, ER+, PR+) - Signed by Lanny Callander, MD on 09/28/2023 Histologic grading system: 3 grade system Residual tumor (R): R0 - None     INTERVAL HISTORY:  Ms. Aziz presents to the Survivorship Clinic today for our initial meeting to review her survivorship care plan detailing her treatment course for breast cancer, as well as monitoring long-term side effects of that treatment, education regarding health maintenance, screening, and overall wellness and health promotion.     Overall, Ms. Brocks reports feeling quite well.  Recovered from surgery and radiation.  Still has mild nipple discoloration.  Denies breast pain or other concerns.    REVIEW OF SYSTEMS:  Review of Systems - Oncology Breast: Denies any new nodularity, masses, tenderness, nipple changes, or nipple discharge.      ONCOLOGY TREATMENT TEAM:  1. Surgeon:  Dr. Aron at [facility] 2. Medical Oncologist: Dr. Lanny  3. Radiation Oncologist: Dr. Izell    PAST MEDICAL/SURGICAL HISTORY:  Past Medical History:  Diagnosis Date    Breast cancer (HCC)    Bronchitis 09/2016   Bruises easily    DDD (degenerative disc disease)    Degenerative joint disease of cervical spine 09/05/2011   Cervival area, now some osteoarthritis-lower back and Rt. shoulder   Depression    Diverticulitis 09/05/2011   hx. gastritis, diverticulitis x2 -now surgery planned   DIVERTICULITIS, ACUTE 02/10/2010   Dyspnea    with exertion   Essential hypertension 09/05/2011   tx. Verapamil    GROIN PAIN 09/21/2010   Headache(784.0)    headaches are better   Hearing loss    right ear   Heart palpitations    Hemorrhoids    History of bronchitis    History of colonic polyps    History of pneumonia    History of tension headache    Hyperlipidemia    Hypertension    Hypothyroidism 09/05/2011   Supplement used   Late effect of adverse effect of drug, medicinal or biological substance    NECK PAIN, ACUTE 09/21/2010   Neuromuscular disorder (HCC)    carpal tunnel in both hands   Nocturia    Osteoarthritis 112-17-12   spine and rt. hip, rt. shoulder   Patent foramen ovale    Small - unable to be closed -    Pneumonia    PONV (postoperative nausea and vomiting)    Sleep apnea 09/05/2011   no cpap ever, had surgery to remove cartilage, no problems now   SORE THROAT 04/30/2009   Stroke (HCC) 09/05/2011   2006/2009-(loss of memory, balance issues remains occ.)   TIA 09/28/2007, 10/2014   Unstable angina (  HCC)    a. 05/2010 Cath: nl cors, EF 55%;  b. 05/2015 Lexiscan  MV: small, severe, fixed apical defect and a small, severe, reversible inf lateral defect w/ apical thinning and mild ischemia, EF 54%.   URI 09/02/2008   Vertigo    Past Surgical History:  Procedure Laterality Date   ABDOMINAL HYSTERECTOMY     ANTERIOR LAT LUMBAR FUSION Left 04/05/2023   Procedure: LEFT-SIDED LUMBAR 2- LUMBAR 3, LUMBAR 3- LUMBAR 4 LATERAL INTERBODY FUSION WITH INSTRUMENTATION AND ALLOGRAFT;  Surgeon: Beuford Anes, MD;  Location: [facility];  Service:  Orthopedics;  Laterality: Left;   BACK SURGERY     BREAST BIOPSY Left 06/29/2023   MM LT BREAST BX W LOC DEV 1ST LESION IMAGE BX SPEC STEREO GUIDE 06/29/2023 [facility]   BREAST BIOPSY Left 08/02/2023   MM LT PLC BREAST LOC DEV   1ST LESION  INC MAMMO GUIDE 08/02/2023 [facility]   BREAST LUMPECTOMY WITH RADIOACTIVE SEED LOCALIZATION Left 08/02/2023   Procedure: WIRE LOCALIZED LEFT BREAST LUMPECTOMY;  Surgeon: Aron Shoulders, MD;  Location:  SURGERY CENTER;  Service: General;  Laterality: Left;  60 MINUTES   CARDIAC CATHETERIZATION N/A 06/01/2015   Procedure: Left Heart Cath and Coronary Angiography;  Surgeon: Lonni JONETTA Cash, MD;  Location: [facility] INVASIVE CV LAB;  Service: Cardiovascular;  Laterality: N/A;   COLON RESECTION  09/07/2011   Procedure: LAPAROSCOPIC SIGMOID COLON RESECTION;  Surgeon: Morene ONEIDA Olives, MD;  Location: [facility];  Service: General;  Laterality: N/A;  with proctoscopy   COLONOSCOPY  08/06/2015   per Dr. Luis, adenomatous polyps, repeat in 5 yrs    CYST EXCISION N/A 11/29/2016   Procedure: EXCISION UMBILICAL CYST;  Surgeon: Morene Olives, MD;  Location: [facility];  Service: General;  Laterality: N/A;   ELBOW SURGERY  09-05-11   left elbow -ligament repair   EYE SURGERY     cataract   INSERTION OF MESH N/A 12/13/2013   Procedure: INSERTION OF MESH;  Surgeon: Morene ONEIDA Olives, MD;  Location: [facility];  Service: General;  Laterality: N/A;   LAPAROSCOPY  09/07/2011   Procedure: LAPAROSCOPY DIAGNOSTIC;  Surgeon: Lynwood FORBES Clubs II;  Location: [facility];  Service: Gynecology;  Laterality: N/A;   MAXIMUM ACCESS (MAS)POSTERIOR LUMBAR INTERBODY FUSION (PLIF) 2 LEVEL N/A 05/03/2016   Procedure: L4-5 L5-S1 Maximum access posterior lumbar interbody fusion;  Surgeon: Fairy Levels, MD;  Location: [facility];  Service: Neurosurgery;  Laterality: N/A;  L4-5 L5-S1 Maximum access posterior lumbar interbody fusion   SALPINGOOPHORECTOMY  09/07/2011    Procedure: SALPINGO OOPHERECTOMY;  Surgeon: Lynwood FORBES Clubs II;  Location: [facility];  Service: Gynecology;  Laterality: Right;   THYROIDECTOMY, PARTIAL  09-05-11   TOTAL HIP ARTHROPLASTY Right 08/21/2018   Procedure: TOTAL HIP ARTHROPLASTY ANTERIOR APPROACH;  Surgeon: Sheril Coy, MD;  Location: [facility];  Service: Orthopedics;  Laterality: Right;   TUBAL LIGATION  1983   UMBILICAL HERNIA REPAIR  yrs ago   VENTRAL HERNIA REPAIR  06/20/2012   Procedure: LAPAROSCOPIC VENTRAL HERNIA;  Surgeon: Morene ONEIDA Olives, MD;  Location: [facility];  Service: General;  Laterality: N/A;  Laparoscopic Repair of Ventral Hernia with mesh   VENTRAL HERNIA REPAIR N/A 12/13/2013   Procedure: LAPAROSCOPIC REPAIR RECURRENT VENTRAL INCISIONAL  HERNIA;  Surgeon: Morene ONEIDA Olives, MD;  Location: [facility];  Service: General;  Laterality: N/A;     ALLERGIES:  Allergies  Allergen Reactions   Dilaudid  [Hydromorphone  Hcl] Shortness Of Breath, Nausea And Vomiting and  Other (See Comments)    Able to take morphine  without issue   Shellfish Allergy Shortness Of Breath and Rash    Only shrimp allergy   Aggrenox [Aspirin -Dipyridamole Er] Nausea And Vomiting and Other (See Comments)    Headache    Codeine Nausea And Vomiting   Doxycycline  Monohydrate Diarrhea   Cephalexin Itching and Rash   Codeine Phosphate Nausea And Vomiting   Contrast Media [Iodinated Contrast Media] Nausea And Vomiting   Hydrocodone  Nausea And Vomiting and Other (See Comments)    Can take morphine  without issue   Meperidine  Hcl Nausea And Vomiting   Percocet [Oxycodone -Acetaminophen ] Nausea And Vomiting and Other (See Comments)    Can take morphine  without issue     CURRENT MEDICATIONS:  Outpatient Encounter Medications as of 04/15/2024  Medication Sig   amLODipine  (NORVASC ) 10 MG tablet TAKE 1 TABLET BY MOUTH EVERY DAY   atorvastatin  (LIPITOR) 80 MG tablet Take 1 tablet (80 mg total) by mouth daily.   butalbital -acetaminophen -caffeine  (FIORICET )  50-325-40 MG tablet TAKE 1 TABLET BY MOUTH EVERY 6 HOURS AS NEEDED FOR HEADACHE   clopidogrel  (PLAVIX ) 75 MG tablet TAKE 1 TABLET (75 MG TOTAL) BY MOUTH DAILY. *LABS REQUIRED FOR FUTURE REFILLS*   ketoconazole  (NIZORAL ) 2 % cream Apply 1 Application topically 2 (two) times daily.   levothyroxine  (SYNTHROID ) 125 MCG tablet TAKE 1 TABLET BY MOUTH EVERY DAY BEFORE BREAKFAST   losartan  (COZAAR ) 100 MG tablet TAKE 1 TABLET BY MOUTH EVERY DAY   metoprolol  succinate (TOPROL -XL) 25 MG 24 hr tablet TAKE 1 TABLET BY MOUTH EVERY DAY.   potassium chloride  (KLOR-CON  10) 10 MEQ tablet Take 1 tablet (10 mEq total) by mouth 2 (two) times daily. Take 2 tab twice daily for 5 days   traMADol  (ULTRAM ) 50 MG tablet Take 2 tablets (100 mg total) by mouth every 6 (six) hours as needed. for pain   No facility-administered encounter medications on file as of 04/15/2024.     ONCOLOGIC FAMILY HISTORY:  Family History  Problem Relation Age of Onset   Arrhythmia Mother    Hypertension Mother    Stroke Mother    Prostate cancer Father 52 - 9       metastatic   Breast cancer Sister        79s   Breast cancer Sister        85s   Stroke Maternal Grandmother    Diabetes Paternal Grandmother    Brain cancer Cousin        dx. >50     GENETIC COUNSELING/TESTING: Yes, negative  SOCIAL HISTORY:  RYLA CAUTHON is married and lives in [place], [place] .  She denies any current alcohol  or illicit drug use.  Quit smoking in her 75s.   PHYSICAL EXAMINATION:  Vital Signs:   Vitals:   04/15/24 1149  BP: 136/70  Pulse: (!) 51  Resp: 17  Temp: 97.8 F (36.6 C)  SpO2: 97%   Filed Weights   04/15/24 1149  Weight: 194 lb 14.4 oz (88.4 kg)   General: Well-nourished, well-appearing female in no acute distress.   HEENT:  Sclerae anicteric.  Respiratory: breathing non-labored.  Neuro: No focal deficits. Steady gait.  Psych: Mood and affect normal and appropriate for situation.  Extremities: No  edema. Breast exam: Deferred  LABORATORY DATA:  None for this visit.  DIAGNOSTIC IMAGING:  None for this visit.      ASSESSMENT AND PLAN:  Ms.. Pevehouse is a pleasant 78 y.o. female with Stage  0 left ductal carcinoma in situ ER+/PR weak +, diagnosed in 06/2023, treated with lumpectomy and adjuvant radiation therapy completing 09/2023.  She presents to the Survivorship Clinic for our initial meeting and routine follow-up post-completion of treatment for breast cancer.    1.  Left breast DCIS:  Ms. Thier has recovered well from definitive treatment.  She will follow-up with her medical oncologist, Dr. Lanny in 6 months with history and physical exam per surveillance protocol.  Given that she completed radiation, she is not on adjuvant endocrine therapy.  Today, a comprehensive survivorship care plan and treatment summary was reviewed with the patient today detailing her breast cancer diagnosis, treatment course, potential late/long-term effects of treatment, appropriate follow-up care with recommendations for the future, and patient education resources.  A copy of this summary, along with a letter will be sent to the patient's primary care provider via mail/fax/In Basket message after today's visit.    2. Bone health:  Given Ms. Rittenhouse age/history of breast cancer, she is at risk for bone demineralization.  She has not had a recent DEXA.  Can go through PCP.  Encouraged to increase her consumption of foods rich in calcium , as well as increase her weight-bearing activities.  She was given education on specific activities to promote bone health.  3. Cancer screening: Per patient she has aged out of GYN and colon cancer screenings.  She does not qualify for lung cancer screening CT.  The information and recommendations are listed on the patient's comprehensive care plan/treatment summary and were reviewed in detail with the patient.    4. Health maintenance and wellness promotion: Ms. Bognar was encouraged to  consume 5-7 servings of fruits and vegetables per day.  She was also encouraged to engage in moderate to vigorous exercise for 30 minutes per day most days of the week. She was instructed to limit her alcohol  consumption and continue to abstain from tobacco use.     5. Support services/counseling: It is not uncommon for this period of the patient's cancer care trajectory to be one of many emotions and stressors.  We discussed an opportunity for her to participate in the next session of FYNN (Finding Your New Normal) support group series designed for patients after they have completed treatment.   Ms. Leckey was encouraged to take advantage of our many other support services programs, support groups, and/or counseling in coping with her new life as a cancer survivor after completing anti-cancer treatment.  She was offered support today through active listening and expressive supportive counseling.  She was given information regarding our available services and encouraged to contact me with any questions or for help enrolling in any of our support group/programs.    Dispo:   -Return to cancer center in 6 months -Mammogram due in 06/2024 (ordered) -Follow up with surgery as indicated -She is welcome to return back to the Survivorship Clinic at any time; no additional follow-up needed at this time.  -Consider referral back to survivorship as a long-term survivor for continued surveillance  Orders Placed This Encounter  Procedures   MM DIAG BREAST TOMO BILATERAL    Standing Status:   Future    Expected Date:   06/23/2024    Expiration Date:   04/15/2025    Reason for Exam (SYMPTOM  OR DIAGNOSIS REQUIRED):   L breast DCIS    Preferred imaging location?:   [facility]     A total of (30) minutes of face-to-face time was spent with this  patient with greater than 50% of that time in counseling and care-coordination.   Daune Colgate, NP Survivorship Program [facility] [phone]   Note: PRIMARY CARE PROVIDER Johnny Garnette LABOR, MD [phone]

## 2024-04-27 ENCOUNTER — Other Ambulatory Visit: Payer: Self-pay | Admitting: Cardiology

## 2024-05-02 ENCOUNTER — Ambulatory Visit (HOSPITAL_COMMUNITY)
Admission: RE | Admit: 2024-05-02 | Discharge: 2024-05-02 | Disposition: A | Discharge status: Home / Self Care | Source: Ambulatory Visit | Attending: Neurology | Admitting: Neurology

## 2024-05-02 DIAGNOSIS — Z8673 Personal history of transient ischemic attack (TIA), and cerebral infarction without residual deficits: Secondary | ICD-10-CM | POA: Diagnosis not present

## 2024-05-02 DIAGNOSIS — I779 Disorder of arteries and arterioles, unspecified: Secondary | ICD-10-CM | POA: Diagnosis not present

## 2024-05-22 ENCOUNTER — Ambulatory Visit: Admitting: Family Medicine

## 2024-05-22 ENCOUNTER — Encounter: Payer: Self-pay | Admitting: Family Medicine

## 2024-05-22 VITALS — BP 110/62 | HR 51 | Temp 98.1°F | Wt 195.0 lb

## 2024-05-22 DIAGNOSIS — E7800 Pure hypercholesterolemia, unspecified: Secondary | ICD-10-CM | POA: Diagnosis not present

## 2024-05-22 DIAGNOSIS — Z8673 Personal history of transient ischemic attack (TIA), and cerebral infarction without residual deficits: Secondary | ICD-10-CM | POA: Diagnosis not present

## 2024-05-22 DIAGNOSIS — Z23 Encounter for immunization: Secondary | ICD-10-CM | POA: Diagnosis not present

## 2024-05-22 DIAGNOSIS — M67442 Ganglion, left hand: Secondary | ICD-10-CM | POA: Diagnosis not present

## 2024-05-22 MED ORDER — POTASSIUM CHLORIDE ER 10 MEQ PO TBCR
10.0000 meq | EXTENDED_RELEASE_TABLET | Freq: Two times a day (BID) | ORAL | 3 refills | Status: AC
Start: 2024-05-22 — End: ?

## 2024-05-22 NOTE — Progress Notes (Signed)
   Subjective:    Patient ID: Abigail [name], female    DOB: March 05, 1946, 78 y.o.   MRN: [id number]  HPI Here for several issues. First she asks my advice about the dose of Atorvastatin  she should take. She saw Dr. Rosemarie in July for follow up on her strokes, and he checked a lipid panel. This revealed an LDL of 86, and his target was to get this below 70. He increased the dose of Atorvastatin  to 80 mg daily, but she has actually been taking 40 mg daily instead. She was nervous about possible side effects from the higher dose. Also she noticed a lump on her left index finger a few days ago. This does not bother her at all.    Review of Systems  Constitutional: Negative.   Respiratory: Negative.    Cardiovascular: Negative.   Musculoskeletal:  Positive for joint swelling.       Objective:   Physical Exam Constitutional:      Comments: Walks slowly with a cane   Cardiovascular:     Rate and Rhythm: Normal rate and regular rhythm.     Pulses: Normal pulses.     Heart sounds: Normal heart sounds.  Pulmonary:     Effort: Pulmonary effort is normal.     Breath sounds: Normal breath sounds.  Musculoskeletal:     Comments: There is a smooth mobile round non-tender lump on the dorsal surface of the left index PIP joint.   Neurological:     Mental Status: She is alert.           Assessment & Plan:  Due to her hx of strokes, I also advised her to take the full 80 mg dose of Atorvastatin . She agreed to do so. We will plan to recheck a lipid panel as well as a liver panel in 90 days. The lump on her finger is a benign ganglion cyst. As long as it is not painful and as long as it does not interfere with her use of the finger, I advised her to simply monitor this. She will follow up as needed.  Garnette Olmsted, MD

## 2024-05-30 ENCOUNTER — Other Ambulatory Visit: Payer: Self-pay | Admitting: Family Medicine

## 2024-05-30 DIAGNOSIS — E7800 Pure hypercholesterolemia, unspecified: Secondary | ICD-10-CM

## 2024-06-25 DIAGNOSIS — N760 Acute vaginitis: Secondary | ICD-10-CM | POA: Diagnosis not present

## 2024-06-25 DIAGNOSIS — N952 Postmenopausal atrophic vaginitis: Secondary | ICD-10-CM | POA: Diagnosis not present

## 2024-06-25 DIAGNOSIS — N941 Unspecified dyspareunia: Secondary | ICD-10-CM | POA: Diagnosis not present

## 2024-07-04 ENCOUNTER — Other Ambulatory Visit: Payer: Self-pay | Admitting: Family Medicine

## 2024-07-04 ENCOUNTER — Telehealth: Payer: Self-pay | Admitting: Family Medicine

## 2024-07-04 DIAGNOSIS — Z860100 Personal history of colon polyps, unspecified: Secondary | ICD-10-CM

## 2024-07-04 NOTE — Telephone Encounter (Signed)
 Copied from CRM #[id number]. Topic: General - Call Back - No Documentation >> Jul 04, 2024 11:07 AM Rea BROCKS wrote: Reason for CRM: Patient called in and stated that she was supposed to receive a referral or call from Dr. Stacia for her colonoscopy. Patient stated that she didn't receive a call for scheduling for it. She has not heard from their office.   May patient receive a follow up call on next steps.   [phone] (M)

## 2024-07-05 NOTE — Telephone Encounter (Signed)
I did the referral to GI °

## 2024-07-09 ENCOUNTER — Other Ambulatory Visit: Payer: Self-pay | Admitting: Family Medicine

## 2024-07-09 ENCOUNTER — Other Ambulatory Visit: Payer: Self-pay | Admitting: Cardiology

## 2024-07-15 ENCOUNTER — Ambulatory Visit: Payer: Medicare PPO | Admitting: Neurology

## 2024-07-23 ENCOUNTER — Other Ambulatory Visit: Payer: Self-pay

## 2024-07-23 ENCOUNTER — Ambulatory Visit
Admission: RE | Admit: 2024-07-23 | Discharge: 2024-07-23 | Disposition: A | Source: Ambulatory Visit | Attending: Nurse Practitioner

## 2024-07-23 DIAGNOSIS — R928 Other abnormal and inconclusive findings on diagnostic imaging of breast: Secondary | ICD-10-CM | POA: Diagnosis not present

## 2024-07-23 DIAGNOSIS — C50922 Malignant neoplasm of unspecified site of left male breast: Secondary | ICD-10-CM

## 2024-07-23 DIAGNOSIS — I10 Essential (primary) hypertension: Secondary | ICD-10-CM

## 2024-07-25 MED ORDER — LOSARTAN POTASSIUM 100 MG PO TABS: 100.0000 mg | ORAL_TABLET | Freq: Every day | ORAL | 0 refills | Status: DC

## 2024-07-26 DIAGNOSIS — M67442 Ganglion, left hand: Secondary | ICD-10-CM | POA: Diagnosis not present

## 2024-07-26 DIAGNOSIS — M79642 Pain in left hand: Secondary | ICD-10-CM | POA: Diagnosis not present

## 2024-08-08 DIAGNOSIS — M67442 Ganglion, left hand: Secondary | ICD-10-CM | POA: Diagnosis not present

## 2024-08-10 ENCOUNTER — Other Ambulatory Visit: Payer: Self-pay | Admitting: Cardiology

## 2024-08-19 ENCOUNTER — Telehealth: Payer: Self-pay | Admitting: Cardiology

## 2024-08-19 NOTE — Telephone Encounter (Signed)
*  STAT* If patient is at the pharmacy, call can be transferred to refill team.   1. Which medications need to be refilled? (please list name of each medication and dose if known)   amLODipine  (NORVASC ) 10 MG tablet    4. Which pharmacy/location (including street and city if local pharmacy) is medication to be sent to? [id number] - [place], [place] - [address] [facility] [place] [facility]    5. Do they need a 30 day or 90 day supply? 90    Sch 1/26

## 2024-08-20 MED ORDER — AMLODIPINE BESYLATE 10 MG PO TABS: 10.0000 mg | ORAL_TABLET | Freq: Every day | ORAL | 0 refills | Status: DC

## 2024-08-20 NOTE — Telephone Encounter (Signed)
 Refill sent

## 2024-08-21 ENCOUNTER — Ambulatory Visit: Admitting: Family Medicine

## 2024-08-22 ENCOUNTER — Encounter: Payer: Self-pay | Admitting: Family Medicine

## 2024-08-22 ENCOUNTER — Ambulatory Visit: Admitting: Family Medicine

## 2024-08-22 VITALS — BP 118/64 | HR 57 | Temp 99.0°F | Wt 204.0 lb

## 2024-08-22 DIAGNOSIS — D696 Thrombocytopenia, unspecified: Secondary | ICD-10-CM

## 2024-08-22 DIAGNOSIS — I10 Essential (primary) hypertension: Secondary | ICD-10-CM | POA: Diagnosis not present

## 2024-08-22 DIAGNOSIS — R739 Hyperglycemia, unspecified: Secondary | ICD-10-CM | POA: Diagnosis not present

## 2024-08-22 DIAGNOSIS — E039 Hypothyroidism, unspecified: Secondary | ICD-10-CM

## 2024-08-22 DIAGNOSIS — Z860100 Personal history of colon polyps, unspecified: Secondary | ICD-10-CM | POA: Diagnosis not present

## 2024-08-22 DIAGNOSIS — E7800 Pure hypercholesterolemia, unspecified: Secondary | ICD-10-CM | POA: Diagnosis not present

## 2024-08-22 LAB — CBC WITH DIFFERENTIAL/PLATELET
Basophils Absolute: 0 K/uL (ref 0.0–0.1)
Basophils Relative: 0.2 % (ref 0.0–3.0)
Eosinophils Absolute: 0.1 K/uL (ref 0.0–0.7)
Eosinophils Relative: 1.8 % (ref 0.0–5.0)
HCT: 40.5 % (ref 36.0–46.0)
Hemoglobin: 13.7 g/dL (ref 12.0–15.0)
Lymphocytes Relative: 34.6 % (ref 12.0–46.0)
Lymphs Abs: 1.7 K/uL (ref 0.7–4.0)
MCHC: 33.9 g/dL (ref 30.0–36.0)
MCV: 90.5 fl (ref 78.0–100.0)
Monocytes Absolute: 0.6 K/uL (ref 0.1–1.0)
Monocytes Relative: 11.2 % (ref 3.0–12.0)
Neutro Abs: 2.6 K/uL (ref 1.4–7.7)
Neutrophils Relative %: 52.2 % (ref 43.0–77.0)
Platelets: 137 K/uL — ABNORMAL LOW (ref 150.0–400.0)
RBC: 4.48 Mil/uL (ref 3.87–5.11)
RDW: 13.2 % (ref 11.5–15.5)
WBC: 4.9 K/uL (ref 4.0–10.5)

## 2024-08-22 LAB — LIPID PANEL
Cholesterol: 161 mg/dL (ref 0–200)
HDL: 71.7 mg/dL (ref >39.00)
LDL Cholesterol: 75 mg/dL (ref 0–99)
NonHDL: 89.56
Total CHOL/HDL Ratio: 2
Triglycerides: 72 mg/dL (ref 0.0–149.0)
VLDL: 14.4 mg/dL (ref 0.0–40.0)

## 2024-08-22 LAB — BASIC METABOLIC PANEL WITH GFR
BUN: 12 mg/dL (ref 6–23)
CO2: 24 meq/L (ref 19–32)
Calcium: 9.7 mg/dL (ref 8.4–10.5)
Chloride: 107 meq/L (ref 96–112)
Creatinine, Ser: 0.68 mg/dL (ref 0.40–1.20)
GFR: 82.89 mL/min (ref >60.00)
Glucose, Bld: 105 mg/dL — ABNORMAL HIGH (ref 70–99)
Potassium: 3.9 meq/L (ref 3.5–5.1)
Sodium: 140 meq/L (ref 135–145)

## 2024-08-22 LAB — HEPATIC FUNCTION PANEL
ALT: 19 U/L (ref 0–35)
AST: 18 U/L (ref 0–37)
Albumin: 4.1 g/dL (ref 3.5–5.2)
Alkaline Phosphatase: 120 U/L — ABNORMAL HIGH (ref 39–117)
Bilirubin, Direct: 0 mg/dL (ref 0.0–0.3)
Total Bilirubin: 0.5 mg/dL (ref 0.2–1.2)
Total Protein: 7.5 g/dL (ref 6.0–8.3)

## 2024-08-22 LAB — HEMOGLOBIN A1C: Hgb A1c MFr Bld: 6 % (ref 4.6–6.5)

## 2024-08-22 LAB — TSH: TSH: 3.13 u[IU]/mL (ref 0.35–5.50)

## 2024-08-22 MED ORDER — DIPHENOXYLATE-ATROPINE 2.5-0.025 MG PO TABS: 2.0000 | ORAL_TABLET | Freq: Four times a day (QID) | ORAL | 3 refills | Status: AC | PRN

## 2024-08-22 NOTE — Progress Notes (Signed)
   Subjective:    Patient ID: Abigail [name], female    DOB: 05-20-1946, 78 y.o.   MRN: [id number]  HPI Here for several issues. First she began taking high dose (80 mg) Atorvastatin  3 months ago, and she wants to check for any liver side effects. I also note that she has not had other labs checked for awhile (potassium, etc). Finally she asks whether she needs to have another colonoscopy or not. She used to see Dr. Luis, but he has retired. During her last colonoscopy with him on 10-29-20 four polyps were removed and some of these were adenomatous. I cannot find in her records what his recommendation to her was based on these results.    Review of Systems  Constitutional: Negative.   Respiratory: Negative.    Cardiovascular: Negative.   Gastrointestinal: Negative.        Objective:   Physical Exam Constitutional:      Comments: Walks with a cane   Cardiovascular:     Rate and Rhythm: Normal rate and regular rhythm.     Pulses: Normal pulses.     Heart sounds: Normal heart sounds.  Pulmonary:     Effort: Pulmonary effort is normal.     Breath sounds: Normal breath sounds.  Neurological:     Mental Status: She is alert.           Assessment & Plan:  For her dyslipidemia, we will check  lipids and a liver panel today. For her hypothyroidism, we will check a CBC. For her hx of thrombocytopenia, we will check a CBC. For the presdiabetes, check an A1c. As for whether she should have one more colonoscopy, we will refer her to [facility] to get their advice.  Garnette Olmsted, MD

## 2024-08-26 ENCOUNTER — Ambulatory Visit: Payer: Self-pay | Admitting: Family Medicine

## 2024-08-26 DIAGNOSIS — R7989 Other specified abnormal findings of blood chemistry: Secondary | ICD-10-CM

## 2024-08-27 DIAGNOSIS — N771 Vaginitis, vulvitis and vulvovaginitis in diseases classified elsewhere: Secondary | ICD-10-CM | POA: Diagnosis not present

## 2024-08-27 DIAGNOSIS — N760 Acute vaginitis: Secondary | ICD-10-CM | POA: Diagnosis not present

## 2024-08-29 ENCOUNTER — Encounter: Payer: Self-pay | Admitting: Gastroenterology

## 2024-08-31 ENCOUNTER — Other Ambulatory Visit: Payer: Self-pay | Admitting: Cardiology

## 2024-08-31 DIAGNOSIS — I10 Essential (primary) hypertension: Secondary | ICD-10-CM

## 2024-09-02 NOTE — Telephone Encounter (Signed)
 PT calling to check on medication. PT is out of Medication. Pt has an appt scheduled 10/14/24, please refill until then.

## 2024-09-08 ENCOUNTER — Other Ambulatory Visit: Payer: Self-pay | Admitting: Family Medicine

## 2024-09-29 ENCOUNTER — Other Ambulatory Visit: Payer: Self-pay | Admitting: Family Medicine

## 2024-09-30 NOTE — Progress Notes (Unsigned)
 "    HPI: FU palpitations. She has a long history of palpitations and presyncope. Patient apparently noted to have a PFO on transesophageal echocardiogram in 2006 following CVA. Cardiac catheterization September 2016 showed no obstructive coronary disease and normal LV function.  Monitor December 2022 showed sinus rhythm with PACs, brief PAT, rare PVC and 9 beats nonsustained ventricular tachycardia.  Cardiac CTA January 2024 showed calcium  score of 58 which was 77 percentile and mild stenosis in the second diagonal and right coronary artery.  Echocardiogram October 2024 showed normal LV function.  Carotid Dopplers August 2025 near normal bilaterally.  Since last seen,   Current Outpatient Medications  Medication Sig Dispense Refill   amLODipine  (NORVASC ) 10 MG tablet Take 1 tablet (10 mg total) by mouth daily. 90 tablet 0   atorvastatin  (LIPITOR) 80 MG tablet Take 1 tablet (80 mg total) by mouth daily. 90 tablet 3   butalbital -acetaminophen -caffeine  (FIORICET ) 50-325-40 MG tablet TAKE 1 TABLET BY MOUTH EVERY 6 HOURS AS NEEDED FOR HEADACHE 60 tablet 5   clopidogrel  (PLAVIX ) 75 MG tablet TAKE 1 TABLET (75 MG TOTAL) BY MOUTH DAILY. *LABS REQUIRED FOR FUTURE REFILLS* 90 tablet 1   diphenoxylate -atropine  (LOMOTIL ) 2.5-0.025 MG tablet Take 2 tablets by mouth 4 (four) times daily as needed for diarrhea or loose stools. 60 tablet 3   ketoconazole  (NIZORAL ) 2 % cream Apply 1 Application topically 2 (two) times daily. 30 g 2   levothyroxine  (SYNTHROID ) 125 MCG tablet TAKE 1 TABLET BY MOUTH EVERY DAY BEFORE BREAKFAST 90 tablet 3   losartan  (COZAAR ) 100 MG tablet TAKE 1 TABLET BY MOUTH EVERY DAY 30 tablet 1   metoprolol  succinate (TOPROL -XL) 25 MG 24 hr tablet TAKE 1 TABLET BY MOUTH EVERY DAY 30 tablet 1   potassium chloride  (KLOR-CON  10) 10 MEQ tablet Take 1 tablet (10 mEq total) by mouth 2 (two) times daily. 180 tablet 3   traMADol  (ULTRAM ) 50 MG tablet Take 2 tablets (100 mg total) by mouth every 6 (six)  hours as needed. for pain 120 tablet 5   No current facility-administered medications for this visit.     Past Medical History:  Diagnosis Date   Breast cancer (HCC)    Bronchitis 09/2016   Bruises easily    DDD (degenerative disc disease)    Degenerative joint disease of cervical spine 09/05/2011   Cervival area, now some osteoarthritis-lower back and Rt. shoulder   Depression    Diverticulitis 09/05/2011   hx. gastritis, diverticulitis x2 -now surgery planned   DIVERTICULITIS, ACUTE 02/10/2010   Dyspnea    with exertion   Essential hypertension 09/05/2011   tx. Verapamil    GROIN PAIN 09/21/2010   Headache(784.0)    headaches are better   Hearing loss    right ear   Heart palpitations    Hemorrhoids    History of bronchitis    History of colonic polyps    History of pneumonia    History of tension headache    Hyperlipidemia    Hypertension    Hypothyroidism 09/05/2011   Supplement used   Late effect of adverse effect of drug, medicinal or biological substance    NECK PAIN, ACUTE 09/21/2010   Neuromuscular disorder (HCC)    carpal tunnel in both hands   Nocturia    Osteoarthritis 112-17-12   spine and rt. hip, rt. shoulder   Patent foramen ovale    Small - unable to be closed -    Pneumonia    PONV (postoperative nausea  and vomiting)    Sleep apnea 09/05/2011   no cpap ever, had surgery to remove cartilage, no problems now   SORE THROAT 04/30/2009   Stroke (HCC) 09/05/2011   2006/2009-(loss of memory, balance issues remains occ.)   TIA 09/28/2007, 10/2014   Unstable angina (HCC)    a. 05/2010 Cath: nl cors, EF 55%;  b. 05/2015 Lexiscan  MV: small, severe, fixed apical defect and a small, severe, reversible inf lateral defect w/ apical thinning and mild ischemia, EF 54%.   URI 09/02/2008   Vertigo     Past Surgical History:  Procedure Laterality Date   ABDOMINAL HYSTERECTOMY     ANTERIOR LAT LUMBAR FUSION Left 04/05/2023   Procedure: LEFT-SIDED LUMBAR 2-  LUMBAR 3, LUMBAR 3- LUMBAR 4 LATERAL INTERBODY FUSION WITH INSTRUMENTATION AND ALLOGRAFT;  Surgeon: Beuford Anes, MD;  Location: [facility];  Service: Orthopedics;  Laterality: Left;   BACK SURGERY     BREAST BIOPSY Left 06/29/2023   MM LT BREAST BX W LOC DEV 1ST LESION IMAGE BX SPEC STEREO GUIDE 06/29/2023 [facility]   BREAST BIOPSY Left 08/02/2023   MM LT PLC BREAST LOC DEV   1ST LESION  INC MAMMO GUIDE 08/02/2023 [facility]   BREAST LUMPECTOMY WITH RADIOACTIVE SEED LOCALIZATION Left 08/02/2023   Procedure: WIRE LOCALIZED LEFT BREAST LUMPECTOMY;  Surgeon: Aron Shoulders, MD;  Location: [facility];  Service: General;  Laterality: Left;  60 MINUTES   CARDIAC CATHETERIZATION N/A 06/01/2015   Procedure: Left Heart Cath and Coronary Angiography;  Surgeon: Lonni JONETTA Cash, MD;  Location: [facility];  Service: Cardiovascular;  Laterality: N/A;   COLON RESECTION  09/07/2011   Procedure: LAPAROSCOPIC SIGMOID COLON RESECTION;  Surgeon: Morene ONEIDA Olives, MD;  Location: [facility];  Service: General;  Laterality: N/A;  with proctoscopy   COLONOSCOPY  08/06/2015   per Dr. Luis, adenomatous polyps, repeat in 5 yrs    CYST EXCISION N/A 11/29/2016   Procedure: EXCISION UMBILICAL CYST;  Surgeon: Morene Olives, MD;  Location: [facility];  Service: General;  Laterality: N/A;   ELBOW SURGERY  09-05-11   left elbow -ligament repair   EYE SURGERY     cataract   INSERTION OF MESH N/A 12/13/2013   Procedure: INSERTION OF MESH;  Surgeon: Morene ONEIDA Olives, MD;  Location: [facility];  Service: General;  Laterality: N/A;   LAPAROSCOPY  09/07/2011   Procedure: LAPAROSCOPY DIAGNOSTIC;  Surgeon: Lynwood FORBES Clubs II;  Location: [facility];  Service: Gynecology;  Laterality: N/A;   MAXIMUM ACCESS (MAS)POSTERIOR LUMBAR INTERBODY FUSION (PLIF) 2 LEVEL N/A 05/03/2016   Procedure: L4-5 L5-S1 Maximum access posterior lumbar interbody fusion;  Surgeon: Fairy Levels, MD;  Location: [facility];   Service: Neurosurgery;  Laterality: N/A;  L4-5 L5-S1 Maximum access posterior lumbar interbody fusion   SALPINGOOPHORECTOMY  09/07/2011   Procedure: SALPINGO OOPHERECTOMY;  Surgeon: Lynwood FORBES Clubs II;  Location: [facility];  Service: Gynecology;  Laterality: Right;   THYROIDECTOMY, PARTIAL  09-05-11   TOTAL HIP ARTHROPLASTY Right 08/21/2018   Procedure: TOTAL HIP ARTHROPLASTY ANTERIOR APPROACH;  Surgeon: Sheril Coy, MD;  Location: [facility];  Service: Orthopedics;  Laterality: Right;   TUBAL LIGATION  1983   UMBILICAL HERNIA REPAIR  yrs ago   VENTRAL HERNIA REPAIR  06/20/2012   Procedure: LAPAROSCOPIC VENTRAL HERNIA;  Surgeon: Morene ONEIDA Olives, MD;  Location: [facility];  Service: General;  Laterality: N/A;  Laparoscopic Repair of Ventral Hernia with mesh   VENTRAL HERNIA REPAIR  N/A 12/13/2013   Procedure: LAPAROSCOPIC REPAIR RECURRENT VENTRAL INCISIONAL  HERNIA;  Surgeon: Morene ONEIDA Olives, MD;  Location: [facility];  Service: General;  Laterality: N/A;    Social History   Socioeconomic History   Marital status: Married    Spouse name: Not on file   Number of children: 3   Years of education: Not on file   Highest education level: Not on file  Occupational History   Not on file  Tobacco Use   Smoking status: Former    Current packs/day: 0.00    Average packs/day: 0.5 packs/day for 5.0 years (2.5 ttl pk-yrs)    Types: Cigarettes    Start date: 09/20/1963    Quit date: 09/19/1968    Years since quitting: 56.0   Smokeless tobacco: Never  Vaping Use   Vaping status: Never Used  Substance and Sexual Activity   Alcohol  use: Yes    Alcohol /week: 2.0 standard drinks of alcohol     Types: 1 Cans of beer, 1 Shots of liquor per week    Comment: rarely   Drug use: No   Sexual activity: Yes  Other Topics Concern   Not on file  Social History Narrative   Lives in [place] with her husband.  She does not routinely exercise.   Social Drivers of Health   Tobacco Use: Medium Risk (08/22/2024)    Patient History    Smoking Tobacco Use: Former    Smokeless Tobacco Use: Never    Passive Exposure: Not on file  Financial Resource Strain: Low Risk (03/15/2024)   Overall Financial Resource Strain (CARDIA)    Difficulty of Paying Living Expenses: Not hard at all  Food Insecurity: No Food Insecurity (03/15/2024)   Epic    Worried About Radiation Protection Practitioner of Food in the Last Year: Never true    Ran Out of Food in the Last Year: Never true  Transportation Needs: No Transportation Needs (03/15/2024)   Epic    Lack of Transportation (Medical): No    Lack of Transportation (Non-Medical): No  Physical Activity: Inactive (03/15/2024)   Exercise Vital Sign    Days of Exercise per Week: 0 days    Minutes of Exercise per Session: 0 min  Stress: No Stress Concern Present (03/15/2024)   [employer] of Occupational Health - Occupational Stress Questionnaire    Feeling of Stress: Not at all  Social Connections: Socially Integrated (03/15/2024)   Social Connection and Isolation Panel    Frequency of Communication with Friends and Family: More than three times a week    Frequency of Social Gatherings with Friends and Family: More than three times a week    Attends Religious Services: More than 4 times per year    Active Member of Clubs or Organizations: Yes    Attends Banker Meetings: More than 4 times per year    Marital Status: Married  Catering Manager Violence: Not At Risk (03/15/2024)   Epic    Fear of Current or Ex-Partner: No    Emotionally Abused: No    Physically Abused: No    Sexually Abused: No  Depression (PHQ2-9): Low Risk (04/15/2024)   Depression (PHQ2-9)    PHQ-2 Score: 0  Alcohol  Screen: Low Risk (03/15/2024)   Alcohol  Screen    Last Alcohol  Screening Score (AUDIT): 1  Housing: Unknown (03/15/2024)   Epic    Unable to Pay for Housing in the Last Year: No    Number of Times Moved in the Last Year: Not  on file    Homeless in the Last Year: No  Utilities: Not At Risk  (03/15/2024)   Epic    Threatened with loss of utilities: No  Health Literacy: Adequate Health Literacy (03/15/2024)   B1300 Health Literacy    Frequency of need for help with medical instructions: Never    Family History  Problem Relation Age of Onset   Arrhythmia Mother    Hypertension Mother    Stroke Mother    Prostate cancer Father 25 - 24       metastatic   Breast cancer Sister        53s   Breast cancer Sister        71s   Stroke Maternal Grandmother    Diabetes Paternal Grandmother    Brain cancer Cousin        dx. >50    ROS: no fevers or chills, productive cough, hemoptysis, dysphasia, odynophagia, melena, hematochezia, dysuria, hematuria, rash, seizure activity, orthopnea, PND, pedal edema, claudication. Remaining systems are negative.  Physical Exam: Well-developed well-nourished in no acute distress.  Skin is warm and dry.  HEENT is normal.  Neck is supple.  Chest is clear to auscultation with normal expansion.  Cardiovascular exam is regular rate and rhythm.  Abdominal exam nontender or distended. No masses palpated. Extremities show no edema. neuro grossly intact  ECG- personally reviewed  A/P  1 palpitations-LV function is normal.  Patient with history of palpitations previously.  We again discussed a smart watch to record any rhythm strips associated with her symptoms.  2 hypertension-patient's blood pressure is controlled.  Continue present medical regimen.  3 coronary artery disease-mild on previous CTA.  Continue statin and Plavix .  4 prior CVA-continue Plavix .  Redell Shallow, MD    "

## 2024-10-10 ENCOUNTER — Ambulatory Visit: Admitting: Gastroenterology

## 2024-10-10 ENCOUNTER — Encounter: Payer: Self-pay | Admitting: Gastroenterology

## 2024-10-10 VITALS — BP 122/70 | HR 51 | Ht 65.0 in | Wt 207.0 lb

## 2024-10-10 DIAGNOSIS — Z8719 Personal history of other diseases of the digestive system: Secondary | ICD-10-CM

## 2024-10-10 DIAGNOSIS — Z860101 Personal history of adenomatous and serrated colon polyps: Secondary | ICD-10-CM | POA: Diagnosis not present

## 2024-10-10 DIAGNOSIS — K5730 Diverticulosis of large intestine without perforation or abscess without bleeding: Secondary | ICD-10-CM

## 2024-10-10 DIAGNOSIS — Z8673 Personal history of transient ischemic attack (TIA), and cerebral infarction without residual deficits: Secondary | ICD-10-CM

## 2024-10-10 DIAGNOSIS — Z860100 Personal history of colon polyps, unspecified: Secondary | ICD-10-CM

## 2024-10-10 NOTE — Progress Notes (Signed)
 "  Discussed the use of AI scribe software for clinical note transcription with the patient, who gave verbal consent to proceed.  HPI : Abigail [name] is a 79 year old female with a history of multiple strokes, coronary artery disease, breast cancer and complicated diverticulitis status post sigmoid resection who presents for follow-up regarding colon cancer screening/polyp surveillance.  She was previously followed by Dr. Luis.  Records are partially available.  Her most recent colonoscopy in February 2022 revealed four small tubular adenomas, three in the cecum and one in the hepatic flexure, all of which were removed. She has had at least two or three colonoscopies in total, with polyps removed at each procedure.  Other than the colonoscopy in February 2022, the other procedure reports/pathology reports are not available for review.  The patient does not ever recall having lots of polyps or any large polyps that required close follow-up.   Following her last colonoscopy, she experienced significant pain and diarrhea and required an emergency room visit and urgent CT scan.  Currently, she denies any concerning gastrointestinal symptoms. Bowel habits have changed, with up to three stools per day. Diarrhea occurs only occasionally and is managed with as-needed Imodium-type medication.   She denies abdominal pain and does not use fiber supplements.   No current gastrointestinal symptoms are of concern to her.  Medical history is notable for multiple strokes, including a major stroke in 2006 and several smaller strokes, as well as prior hip replacement and two back surgeries. Cardiac history includes heart problems managed with metoprolol  and amlodipine . She experiences intermittent sharp or aching chest pain and shortness of breath, which she attributes to her cardiac condition.  Family history includes a father who died of prostate cancer, a paternal uncle with colon polyps, a sister who died of brain  cancer, and another sister with breast cancer. She underwent a lumpectomy for breast cancer in the past year.      Past Medical History:  Diagnosis Date   Breast cancer (HCC)    Bronchitis 09/2016   Bruises easily    DDD (degenerative disc disease)    Degenerative joint disease of cervical spine 09/05/2011   Cervival area, now some osteoarthritis-lower back and Rt. shoulder   Depression    Diverticulitis 09/05/2011   hx. gastritis, diverticulitis x2 -now surgery planned   DIVERTICULITIS, ACUTE 02/10/2010   Dyspnea    with exertion   Essential hypertension 09/05/2011   tx. Verapamil    GROIN PAIN 09/21/2010   Headache(784.0)    headaches are better   Hearing loss    right ear   Heart palpitations    Hemorrhoids    History of bronchitis    History of colonic polyps    History of pneumonia    History of tension headache    Hyperlipidemia    Hypertension    Hypothyroidism 09/05/2011   Supplement used   Late effect of adverse effect of drug, medicinal or biological substance    NECK PAIN, ACUTE 09/21/2010   Neuromuscular disorder (HCC)    carpal tunnel in both hands   Nocturia    Osteoarthritis 112-17-12   spine and rt. hip, rt. shoulder   Patent foramen ovale    Small - unable to be closed -    Pneumonia    PONV (postoperative nausea and vomiting)    Sleep apnea 09/05/2011   no cpap ever, had surgery to remove cartilage, no problems now   SORE THROAT 04/30/2009   Stroke (HCC)  09/05/2011   2006/2009-(loss of memory, balance issues remains occ.)   TIA 09/28/2007, 10/2014   Unstable angina (HCC)    a. 05/2010 Cath: nl cors, EF 55%;  b. 05/2015 Lexiscan  MV: small, severe, fixed apical defect and a small, severe, reversible inf lateral defect w/ apical thinning and mild ischemia, EF 54%.   URI 09/02/2008   Vertigo      Past Surgical History:  Procedure Laterality Date   ABDOMINAL HYSTERECTOMY     ANTERIOR LAT LUMBAR FUSION Left 04/05/2023   Procedure: LEFT-SIDED LUMBAR  2- LUMBAR 3, LUMBAR 3- LUMBAR 4 LATERAL INTERBODY FUSION WITH INSTRUMENTATION AND ALLOGRAFT;  Surgeon: Beuford Anes, MD;  Location: [facility];  Service: Orthopedics;  Laterality: Left;   BACK SURGERY     BREAST BIOPSY Left 06/29/2023   MM LT BREAST BX W LOC DEV 1ST LESION IMAGE BX SPEC STEREO GUIDE 06/29/2023 [facility]   BREAST BIOPSY Left 08/02/2023   MM LT PLC BREAST LOC DEV   1ST LESION  INC MAMMO GUIDE 08/02/2023 [facility]   BREAST LUMPECTOMY WITH RADIOACTIVE SEED LOCALIZATION Left 08/02/2023   Procedure: WIRE LOCALIZED LEFT BREAST LUMPECTOMY;  Surgeon: Aron Shoulders, MD;  Location: [facility];  Service: General;  Laterality: Left;  60 MINUTES   CARDIAC CATHETERIZATION N/A 06/01/2015   Procedure: Left Heart Cath and Coronary Angiography;  Surgeon: Lonni JONETTA Cash, MD;  Location: [facility] INV[facility] CV [facility];  Service: Cardiovascular;  Laterality: N/A;   COLON RESECTION  09/07/2011   Procedure: LAPAROSCOPIC SIGMOID COLON RESECTION;  Surgeon: Morene ONEIDA Olives, MD;  Location: [facility];  Service: General;  Laterality: N/A;  with proctoscopy   COLONOSCOPY  08/06/2015   per Dr. Luis, adenomatous polyps, repeat in 5 yrs    CYST EXCISION N/A 11/29/2016   Procedure: EXCISION UMBILICAL CYST;  Surgeon: Morene Olives, MD;  Location: [facility];  Service: General;  Laterality: N/A;   ELBOW SURGERY  09-05-11   left elbow -ligament repair   EYE SURGERY     cataract   INSERTION OF MESH N/A 12/13/2013   Procedure: INSERTION OF MESH;  Surgeon: Morene ONEIDA Olives, MD;  Location: [facility];  Service: General;  Laterality: N/A;   LAPAROSCOPY  09/07/2011   Procedure: LAPAROSCOPY DIAGNOSTIC;  Surgeon: Lynwood FORBES Clubs II;  Location: [facility];  Service: Gynecology;  Laterality: N/A;   MAXIMUM ACCESS (MAS)POSTERIOR LUMBAR INTERBODY FUSION (PLIF) 2 LEVEL N/A 05/03/2016   Procedure: L4-5 L5-S1 Maximum access posterior lumbar interbody fusion;  Surgeon: Fairy Levels, MD;  Location: [facility];  Service: Neurosurgery;  Laterality: N/A;  L4-5 L5-S1 Maximum access posterior lumbar interbody fusion   SALPINGOOPHORECTOMY  09/07/2011   Procedure: SALPINGO OOPHERECTOMY;  Surgeon: Lynwood FORBES Clubs II;  Location: [facility];  Service: Gynecology;  Laterality: Right;   THYROIDECTOMY, PARTIAL  09-05-11   TOTAL HIP ARTHROPLASTY Right 08/21/2018   Procedure: TOTAL HIP ARTHROPLASTY ANTERIOR APPROACH;  Surgeon: Sheril Coy, MD;  Location: [facility];  Service: Orthopedics;  Laterality: Right;   TUBAL LIGATION  1983   UMBILICAL HERNIA REPAIR  yrs ago   VENTRAL HERNIA REPAIR  06/20/2012   Procedure: LAPAROSCOPIC VENTRAL HERNIA;  Surgeon: Morene ONEIDA Olives, MD;  Location: [facility];  Service: General;  Laterality: N/A;  Laparoscopic Repair of Ventral Hernia with mesh   VENTRAL HERNIA REPAIR N/A 12/13/2013   Procedure: LAPAROSCOPIC REPAIR RECURRENT VENTRAL INCISIONAL  HERNIA;  Surgeon: Morene ONEIDA Olives, MD;  Location: [facility];  Service: General;  Laterality: N/A;  Family History  Problem Relation Age of Onset   Arrhythmia Mother    Hypertension Mother    Stroke Mother    Prostate cancer Father 75 - 53       metastatic   Breast cancer Sister        36s   Breast cancer Sister        79s   Stroke Maternal Grandmother    Diabetes Paternal Grandmother    Brain cancer Cousin        dx. >50   Social History[1] Current Outpatient Medications  Medication Sig Dispense Refill   amLODipine  (NORVASC ) 10 MG tablet Take 1 tablet (10 mg total) by mouth daily. 90 tablet 0   atorvastatin  (LIPITOR) 80 MG tablet Take 1 tablet (80 mg total) by mouth daily. 90 tablet 3   butalbital -acetaminophen -caffeine  (FIORICET ) 50-325-40 MG tablet TAKE 1 TABLET BY MOUTH EVERY 6 HOURS AS NEEDED FOR HEADACHE 60 tablet 5   clopidogrel  (PLAVIX ) 75 MG tablet TAKE 1 TABLET (75 MG TOTAL) BY MOUTH DAILY. *LABS REQUIRED FOR FUTURE REFILLS* 90 tablet 1   diphenoxylate -atropine  (LOMOTIL ) 2.5-0.025 MG tablet Take 2 tablets by mouth 4  (four) times daily as needed for diarrhea or loose stools. 60 tablet 3   ketoconazole  (NIZORAL ) 2 % cream Apply 1 Application topically 2 (two) times daily. 30 g 2   levothyroxine  (SYNTHROID ) 125 MCG tablet TAKE 1 TABLET BY MOUTH EVERY DAY BEFORE BREAKFAST 90 tablet 3   losartan  (COZAAR ) 100 MG tablet TAKE 1 TABLET BY MOUTH EVERY DAY 30 tablet 1   metoprolol  succinate (TOPROL -XL) 25 MG 24 hr tablet TAKE 1 TABLET BY MOUTH EVERY DAY 30 tablet 1   potassium chloride  (KLOR-CON  10) 10 MEQ tablet Take 1 tablet (10 mEq total) by mouth 2 (two) times daily. 180 tablet 3   traMADol  (ULTRAM ) 50 MG tablet Take 2 tablets (100 mg total) by mouth every 6 (six) hours as needed. for pain 120 tablet 5   No current facility-administered medications for this visit.   Allergies[2]   Review of Systems: All systems reviewed and negative except where noted in HPI.    No results found.  Physical Exam: BP 122/70   Pulse (!) 51   Ht 5' 5 (1.651 m)   Wt 207 lb (93.9 kg)   LMP  (LMP Unknown)   SpO2 98%   BMI 34.45 kg/m  Constitutional: Pleasant,well-developed, African-American female in no acute distress.  Uses cane for ambulation HEENT: Normocephalic and atraumatic. Conjunctivae are normal. No scleral icterus. Neck supple.  Cardiovascular: Normal rate, regular rhythm.  Pulmonary/chest: Effort normal and breath sounds normal. No wheezing, rales or rhonchi. Abdominal: Soft, nondistended, nontender. Bowel sounds active throughout. There are no masses palpable. No hepatomegaly. Extremities: no edema Neurological: Alert and oriented to person place and time. Skin: Skin is warm and dry. No rashes noted. Psychiatric: Normal mood and affect. Behavior is normal.  CBC    Component Value Date/Time   WBC 4.9 08/22/2024 1143   RBC 4.48 08/22/2024 1143   HGB 13.7 08/22/2024 1143   HGB 12.0 07/05/2023 1238   HCT 40.5 08/22/2024 1143   PLT 137.0 (L) 08/22/2024 1143   PLT 171 07/05/2023 1238   MCV 90.5  08/22/2024 1143   MCH 29.3 07/05/2023 1238   MCHC 33.9 08/22/2024 1143   RDW 13.2 08/22/2024 1143   LYMPHSABS 1.7 08/22/2024 1143   MONOABS 0.6 08/22/2024 1143   EOSABS 0.1 08/22/2024 1143   BASOSABS 0.0 08/22/2024 1143  CMP     Component Value Date/Time   NA 140 08/22/2024 1143   NA 143 08/30/2022 1155   K 3.9 08/22/2024 1143   CL 107 08/22/2024 1143   CO2 24 08/22/2024 1143   GLUCOSE 105 (H) 08/22/2024 1143   BUN 12 08/22/2024 1143   BUN 12 08/30/2022 1155   CREATININE 0.68 08/22/2024 1143   CREATININE 0.75 07/05/2023 1238   CREATININE 0.73 04/01/2020 1402   CALCIUM  9.7 08/22/2024 1143   PROT 7.5 08/22/2024 1143   PROT 7.5 08/20/2021 1035   ALBUMIN 4.1 08/22/2024 1143   ALBUMIN 4.3 08/20/2021 1035   AST 18 08/22/2024 1143   AST 9 (L) 07/05/2023 1238   ALT 19 08/22/2024 1143   ALT 7 07/05/2023 1238   ALKPHOS 120 (H) 08/22/2024 1143   BILITOT 0.5 08/22/2024 1143   BILITOT 0.4 07/05/2023 1238   GFRNONAA >60 07/05/2023 1238   GFRAA 100 08/27/2020 1238       Latest Ref Rng & Units 08/22/2024   11:43 AM 12/12/2023    2:51 PM 07/05/2023   12:38 PM  CBC EXTENDED  WBC 4.0 - 10.5 K/uL 4.9  7.4  5.0   RBC 3.87 - 5.11 Mil/uL 4.48  4.51  4.09   Hemoglobin 12.0 - 15.0 g/dL 86.2  86.2  87.9   HCT 36.0 - 46.0 % 40.5  40.1  36.5   Platelets 150.0 - 400.0 K/uL 137.0  144.0  171   NEUT# 1.4 - 7.7 K/uL 2.6   2.7   Lymph# 0.7 - 4.0 K/uL 1.7   1.4       ASSESSMENT AND PLAN:  79 year old female with history of multiple strokes, coronary artery disease, breast cancer and history of polyps on previous colonoscopies, here to discuss possible surveillance colonoscopy.  Her last colonoscopy in 2022 had 4 small polyps.  Current guidelines would recommend repeat colonoscopy in 3-5 years.  Patient does not have other risk factors for colon cancer.  Overall, I think that her risk of colon cancer in her lifetime is quite low given her regular endoscopic surveillance and lack of high risk  polyps.  She is at increased risk for complications from sedation given her age, comorbidities and recurrent strokes.  I recommended against any further colon cancer screening.  Personal history of colonic polyps Low risk of colorectal carcinoma due to age, comorbidities, and prior findings of small tubular adenomas. Risks of further colonoscopic surveillance likely outweigh benefits. - Discontinued routine colonoscopic screening for colorectal carcinoma. - Advised to seek evaluation for symptoms such as hematochezia or unexplained anemia.  Diverticulosis of colon status post resection for diverticulitis Asymptomatic with regular bowel movements. Daily fiber supplementation indicated to maintain bowel regularity and reduce recurrence risk. - Recommended daily fiber supplementation with Metamucil. - Advised to monitor for gastrointestinal symptoms and return for evaluation if symptoms develop.  Recording duration: 11 minutes      I spent a total of 30 minutes reviewing the patient's medical record, interviewing and examining the patient, discussing her diagnosis and management of her condition going forward, and documenting in the medical record   Skylor Schnapp E. Stacia, MD [facility]    Johnny Garnette LABOR, MD     [1]  Social History Tobacco Use   Smoking status: Former    Current packs/day: 0.00    Average packs/day: 0.5 packs/day for 5.0 years (2.5 ttl pk-yrs)    Types: Cigarettes    Start date: 09/20/1963    Quit date: 09/19/1968  Years since quitting: 56.0   Smokeless tobacco: Never  Vaping Use   Vaping status: Never Used  Substance Use Topics   Alcohol  use: Yes    Alcohol /week: 2.0 standard drinks of alcohol     Types: 1 Cans of beer, 1 Shots of liquor per week    Comment: rarely   Drug use: No  [2]  Allergies Allergen Reactions   Dilaudid  [Hydromorphone  Hcl] Shortness Of Breath, Nausea And Vomiting and Other (See Comments)    Able to take morphine  without  issue   Shellfish Allergy Shortness Of Breath and Rash    Only shrimp allergy   Aggrenox [Aspirin -Dipyridamole Er] Nausea And Vomiting and Other (See Comments)    Headache    Codeine Nausea And Vomiting   Doxycycline  Monohydrate Diarrhea   Cephalexin Itching and Rash   Codeine Phosphate Nausea And Vomiting   Contrast Media [Iodinated Contrast Media] Nausea And Vomiting   Hydrocodone  Nausea And Vomiting and Other (See Comments)    Can take morphine  without issue   Meperidine  Hcl Nausea And Vomiting   Percocet [Oxycodone -Acetaminophen ] Nausea And Vomiting and Other (See Comments)    Can take morphine  without issue   "

## 2024-10-10 NOTE — Patient Instructions (Signed)
 A high fiber diet with plenty of fluids (up to 8 glasses of water daily) is suggested to relieve these symptoms.  Metamucil, 1 tablespoon once daily can be used to keep bowels regular if needed.   Follow up as needed.   _______________________________________________________  If your blood pressure at your visit was 140/90 or greater, please contact your primary care physician to follow up on this.  _______________________________________________________  If you are age 22 or older, your body mass index should be between 23-30. Your Body mass index is 34.45 kg/m. If this is out of the aforementioned range listed, please consider follow up with your Primary Care Provider.  If you are age 64 or younger, your body mass index should be between 19-25. Your Body mass index is 34.45 kg/m. If this is out of the aformentioned range listed, please consider follow up with your Primary Care Provider.   ________________________________________________________  The [facility] providers would like to encourage you to use MYCHART to communicate with providers for non-urgent requests or questions.  Due to long hold times on the telephone, sending your provider a message by [facility] may be a faster and more efficient way to get a response.  Please allow 48 business hours for a response.  Please remember that this is for non-urgent requests.  _______________________________________________________  [facility] is using a team-based approach to care.  Your team is made up of your doctor and two to three APPS. Our APPS (Nurse Practitioners and Physician Assistants) work with your physician to ensure care continuity for you. They are fully qualified to address your health concerns and develop a treatment plan. They communicate directly with your gastroenterologist to care for you. Seeing the Advanced Practice Practitioners on your physician's team can help you by facilitating care more promptly, often  allowing for earlier appointments, access to diagnostic testing, procedures, and other specialty referrals.   Thank you for choosing me and [facility].  Dr.Cunningham

## 2024-10-14 ENCOUNTER — Telehealth: Payer: Self-pay | Admitting: Nurse Practitioner

## 2024-10-14 ENCOUNTER — Other Ambulatory Visit: Payer: Self-pay | Admitting: Nurse Practitioner

## 2024-10-14 ENCOUNTER — Ambulatory Visit: Admitting: Cardiology

## 2024-10-14 DIAGNOSIS — C50922 Malignant neoplasm of unspecified site of left male breast: Secondary | ICD-10-CM

## 2024-10-14 NOTE — Assessment & Plan Note (Signed)
 grade 2, ER+ /PR+ -I discussed her breast imaging and needle biopsy results with patient and her family members in great detail. -She is a candidate for breast conservation surgery. She has been seen by breast surgeon Dr. Aron who recommends lumpectomy. -Given her strong family history of breast cancer, we recommend her to undergo genetic testing to ruled out inheritable breast cancer.  Her genetic screening was negative. -Her DCIS will be cured by complete surgical resection. Any form of adjuvant therapy is preventive. -Given her strongly positive/negative ER and PR, the benefit and potential side effect of  antiestrogen therapy  with tamoxifen was discussed, which would decrease her risk of future breast cancer by ~40%. Ttamoxifen 5 mg daily for 3 years was recommended. -She will likely benefit from breast radiation after she undergoes lumpectomy to decrease the risk of breast cancer. -Due to her advanced age, and overall estimated 15% risk of future breast cancer in the next 10 years, we do not feel she needs both antiestrogen therapy or radiation, she is leaning towards radiation. -We also discussed that biopsy may have sampling limitation, we will review her surgical path, to see if she has any invasive carcinoma components. -We discussed breast cancer surveillance after she completes treatment, Including annual mammogram, breast exam every 6-12 months.

## 2024-10-14 NOTE — Telephone Encounter (Signed)
 Rescheduled appointments due to cancer cancer not opening up until 10am on Tuesday 1/27. Called and left VM with the changes made to the patients upcoming appointments.

## 2024-10-14 NOTE — Progress Notes (Unsigned)
 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 -She is a candidate for breast conservation surgery. She has been seen by breast surgeon Dr. Aron who recommends lumpectomy. -Given her strong family history of breast cancer, we recommend her to undergo genetic testing to ruled out inheritable breast cancer.  Her genetic screening was negative. -Her DCIS will be cured by complete surgical resection. Any form of adjuvant therapy is preventive. -Given her strongly positive/negative ER and PR, the benefit and potential side effect of  antiestrogen therapy  with tamoxifen was discussed, which would decrease her risk of future breast cancer by ~40%. Ttamoxifen 5 mg daily for 3 years was recommended. -She will likely benefit from breast radiation after she undergoes lumpectomy to decrease the risk of breast cancer. -Due to her advanced age, and overall estimated 15% risk of future breast cancer in the next 10 years, we do not feel she needs both antiestrogen therapy or radiation, she is leaning towards radiation. -We also discussed that biopsy may have sampling limitation, we will review her surgical path, to see if she has any invasive carcinoma components. -We discussed breast cancer surveillance after she completes treatment, Including annual mammogram, breast exam every 6-12 months.    The patient understands the plans  discussed today and is in agreement with them.  She knows to contact our office if she develops concerns prior to her next appointment.  I provided *** minutes of face-to-face time during this encounter and > 50% was spent counseling as documented under my assessment and plan.    Powell FORBES Lessen, NP  [facility] - A DEPT OF [facility] [address] [place] [address] Dept: [phone] Dept Fax: [phone]   No orders of the defined types were placed in this encounter.     CHIEF COMPLAINT:  CC: DCIS of left breast  Current Treatment: Breast cancer surveillance  INTERVAL HISTORY:  Cheronda is here today for repeat clinical assessment.  She had a survivorship visit with Lacie, NP, 04/15/2024.  She had 3D diagnostic mammogram on 08/12/2024.  She has breast density category C with overall benign results.  Consider contrast-enhanced mammogram in November 2026.  She denies fevers or chills. She denies pain. Her appetite is good. Her weight {Weight change:10426}.  I have reviewed the past medical history, past surgical history, social history and family history with the patient and they are unchanged from previous note.  ALLERGIES:  is allergic to dilaudid  [hydromorphone  hcl], shellfish allergy, aggrenox [aspirin -dipyridamole er], codeine, doxycycline  monohydrate, cephalexin, codeine phosphate, contrast media [iodinated contrast media], hydrocodone , meperidine  hcl, and percocet [oxycodone -acetaminophen ].  MEDICATIONS:  Current Outpatient Medications  Medication Sig Dispense Refill   amLODipine  (NORVASC ) 10 MG tablet Take 1 tablet (10 mg total) by mouth daily. 90 tablet 0   atorvastatin  (LIPITOR) 80 MG tablet Take 1 tablet (80 mg total) by mouth daily. 90 tablet  3   butalbital -acetaminophen -caffeine  (FIORICET ) 50-325-40 MG tablet TAKE 1 TABLET BY MOUTH EVERY 6 HOURS AS NEEDED FOR HEADACHE 60 tablet 5   clopidogrel  (PLAVIX )  75 MG tablet TAKE 1 TABLET (75 MG TOTAL) BY MOUTH DAILY. *LABS REQUIRED FOR FUTURE REFILLS* 90 tablet 1   diphenoxylate -atropine  (LOMOTIL ) 2.5-0.025 MG tablet Take 2 tablets by mouth 4 (four) times daily as needed for diarrhea or loose stools. 60 tablet 3   ketoconazole  (NIZORAL ) 2 % cream Apply 1 Application topically 2 (two) times daily. 30 g 2   levothyroxine  (SYNTHROID ) 125 MCG tablet TAKE 1 TABLET BY MOUTH EVERY DAY BEFORE BREAKFAST 90 tablet 3   losartan  (COZAAR ) 100 MG tablet TAKE 1 TABLET BY MOUTH EVERY DAY 30 tablet 1   metoprolol  succinate (TOPROL -XL) 25 MG 24 hr tablet TAKE 1 TABLET BY MOUTH EVERY DAY 30 tablet 1   potassium chloride  (KLOR-CON  10) 10 MEQ tablet Take 1 tablet (10 mEq total) by mouth 2 (two) times daily. 180 tablet 3   traMADol  (ULTRAM ) 50 MG tablet Take 2 tablets (100 mg total) by mouth every 6 (six) hours as needed. for pain 120 tablet 5   No current facility-administered medications for this visit.    HISTORY OF PRESENT ILLNESS:   Oncology History  Ductal carcinoma in situ (DCIS) of left breast  07/03/2023 Initial Diagnosis   Ductal carcinoma in situ (DCIS) of left breast    Genetic Testing   [facility] CancerNext+RNA Panel was Negative. Report date is 07/25/2023.   The CancerNext gene panel offered by [employer] includes sequencing, rearrangement analysis, and RNA analysis for the following 34 genes:  APC, ATM, AXIN2, BARD1, BMPR1A, BRCA1, BRCA2, BRIP1, CDH1, CDK4, CDKN2A, CHEK2, DICER1, HOXB13, EPCAM, GREM1, MLH1, MSH2, MSH3, MSH6, MUTYH, NF1, NTHL1, PALB2, PMS2, POLD1, POLE, PTEN, RAD51C, RAD51D, SMAD4, SMARCA4, STK11, and TP53.    08/02/2023 Cancer Staging   Staging form: Breast, AJCC 8th Edition - Pathologic stage from 08/02/2023: Stage Unknown (pTis (DCIS), pNX, cM0, G2, ER+, PR+) - Signed by Lanny Callander, MD on 09/28/2023 Histologic grading system: 3 grade system Residual tumor (R): R0 - None       REVIEW OF SYSTEMS:   Constitutional: Denies fevers,  chills or abnormal weight loss Eyes: Denies blurriness of vision Ears, nose, mouth, throat, and face: Denies mucositis or sore throat Respiratory: Denies cough, dyspnea or wheezes Cardiovascular: Denies palpitation, chest discomfort or lower extremity swelling Gastrointestinal:  Denies nausea, heartburn or change in bowel habits Skin: Denies abnormal skin rashes Lymphatics: Denies new lymphadenopathy or easy bruising Neurological:Denies numbness, tingling or new weaknesses Behavioral/Psych: Mood is stable, no new changes  All other systems were reviewed with the patient and are negative.   VITALS:  There were no vitals taken for this visit.  Wt Readings from Last 3 Encounters:  10/10/24 207 lb (93.9 kg)  08/22/24 204 lb (92.5 kg)  05/22/24 195 lb (88.5 kg)    There is no height or weight on file to calculate BMI.  Performance status (ECOG): {CHL ONC [id number]  PHYSICAL EXAM:   GENERAL:alert, no distress and comfortable SKIN: skin color, texture, turgor are normal, no rashes or significant lesions EYES: normal, Conjunctiva are pink and non-injected, sclera clear OROPHARYNX:no exudate, no erythema and lips, buccal mucosa, and tongue normal  NECK: supple, thyroid  normal size, non-tender, without nodularity LYMPH:  no palpable lymphadenopathy in the cervical, axillary or inguinal LUNGS: clear to auscultation and percussion with normal breathing effort HEART: regular rate & rhythm and no murmurs and  no lower extremity edema ABDOMEN:abdomen soft, non-tender and normal bowel sounds Musculoskeletal:no cyanosis of digits and no clubbing  NEURO: alert & oriented x 3 with fluent speech, no focal motor/sensory deficits  LABORATORY DATA:  I have reviewed the data as listed    Component Value Date/Time   NA 140 08/22/2024 1143   NA 143 08/30/2022 1155   K 3.9 08/22/2024 1143   CL 107 08/22/2024 1143   CO2 24 08/22/2024 1143   GLUCOSE 105 (H) 08/22/2024 1143   BUN 12 08/22/2024  1143   BUN 12 08/30/2022 1155   CREATININE 0.68 08/22/2024 1143   CREATININE 0.75 07/05/2023 1238   CREATININE 0.73 04/01/2020 1402   CALCIUM  9.7 08/22/2024 1143   PROT 7.5 08/22/2024 1143   PROT 7.5 08/20/2021 1035   ALBUMIN 4.1 08/22/2024 1143   ALBUMIN 4.3 08/20/2021 1035   AST 18 08/22/2024 1143   AST 9 (L) 07/05/2023 1238   ALT 19 08/22/2024 1143   ALT 7 07/05/2023 1238   ALKPHOS 120 (H) 08/22/2024 1143   BILITOT 0.5 08/22/2024 1143   BILITOT 0.4 07/05/2023 1238   GFRNONAA >60 07/05/2023 1238   GFRAA 100 08/27/2020 1238    No results found for: SPEP, UPEP  Lab Results  Component Value Date   WBC 4.9 08/22/2024   NEUTROABS 2.6 08/22/2024   HGB 13.7 08/22/2024   HCT 40.5 08/22/2024   MCV 90.5 08/22/2024   PLT 137.0 (L) 08/22/2024      Chemistry      Component Value Date/Time   NA 140 08/22/2024 1143   NA 143 08/30/2022 1155   K 3.9 08/22/2024 1143   CL 107 08/22/2024 1143   CO2 24 08/22/2024 1143   BUN 12 08/22/2024 1143   BUN 12 08/30/2022 1155   CREATININE 0.68 08/22/2024 1143   CREATININE 0.75 07/05/2023 1238   CREATININE 0.73 04/01/2020 1402      Component Value Date/Time   CALCIUM  9.7 08/22/2024 1143   ALKPHOS 120 (H) 08/22/2024 1143   AST 18 08/22/2024 1143   AST 9 (L) 07/05/2023 1238   ALT 19 08/22/2024 1143   ALT 7 07/05/2023 1238   BILITOT 0.5 08/22/2024 1143   BILITOT 0.4 07/05/2023 1238       RADIOGRAPHIC STUDIES: I have personally reviewed the radiological images as listed and agreed with the findings in the report. No results found. "

## 2024-10-15 ENCOUNTER — Ambulatory Visit: Payer: Self-pay

## 2024-10-15 ENCOUNTER — Inpatient Hospital Stay

## 2024-10-15 ENCOUNTER — Inpatient Hospital Stay: Admitting: Nurse Practitioner

## 2024-10-15 NOTE — Telephone Encounter (Signed)
 FYI Only or Action Required?: FYI only for provider: appointment scheduled on 10/16/2024 9:00 AM Johnny Garnette LABOR, MD [facility].  Patient was last seen in primary care on 08/22/2024 by Johnny Garnette LABOR, MD.  Called Nurse Triage reporting Leg Swelling.  Symptoms began 1-2 months ago.  Interventions attempted: Other: elevation .  Symptoms are: stable.  Triage Disposition: See Physician Within 24 Hours  Patient/caregiver understands and will follow disposition?: Yes    Reason for Disposition  [1] MODERATE leg swelling (e.g., swelling extends up to knees) AND [2] new-onset or getting worse  Answer Assessment - Initial Assessment Questions Patient reports symptoms started this Jeanine January feet swelling was waiting to see cardiologist had appointment Monday cancelled due to snow. Feet swelling some December too swelling went down. And now its constant swelling. Was wondering if the amlodipine  has to do with it. The swelling ankle , feet and lower legs, its not above the knees. Legs and feet are not red. Able to stand and walk. Pain in right leg is sore to touch not constant pain. Booked appointment 10/16/2024 9:00 AM Johnny Garnette LABOR, MD [facility].     1. ONSET: When did the swelling start? (e.g., minutes, hours, days)     In December was intermittent and now constant this month  2. LOCATION: What part of the leg is swollen?  Are both legs swollen or just one leg?     Both lower legs ankles and feet  3. SEVERITY: How bad is the swelling? (e.g., localized; mild, moderate, severe)     Moderate  4. REDNESS: Is there redness or signs of infection?     Denies  5. PAIN: Is the swelling painful to touch? If Yes, ask: How painful is it?   (Scale 1-10; mild, moderate or severe)     Mild pain tender in lower right leg to touch  6. FEVER: Do you have a fever? If Yes, ask: What is it, how was it measured, and when did it start?      denies 7. CAUSE: What do you think  is causing the leg swelling?     Unknown  8. MEDICAL HISTORY: Do you have a history of blood clots (e.g., DVT), cancer, heart failure, kidney disease, or liver failure?     Seeing cardiology angina  9. RECURRENT SYMPTOM: Have you had leg swelling before? If Yes, ask: When was the last time? What happened that time?     No first time  10. OTHER SYMPTOMS: Do you have any other symptoms? (e.g., chest pain, difficulty breathing)       Patient denies the following chest pain , difficulty breathing  Protocols used: Leg Swelling and Edema-A-AH  Message from [facility] sent at 10/15/2024  1:48 PM EST  Reason for Triage: sudden or increased swelling to the legs and feet

## 2024-10-16 ENCOUNTER — Encounter: Payer: Self-pay | Admitting: Family Medicine

## 2024-10-16 ENCOUNTER — Ambulatory Visit: Admitting: Family Medicine

## 2024-10-16 VITALS — BP 118/80 | HR 52 | Temp 98.4°F | Wt 208.0 lb

## 2024-10-16 DIAGNOSIS — M25471 Effusion, right ankle: Secondary | ICD-10-CM | POA: Diagnosis not present

## 2024-10-16 DIAGNOSIS — M25472 Effusion, left ankle: Secondary | ICD-10-CM | POA: Diagnosis not present

## 2024-10-16 DIAGNOSIS — I10 Essential (primary) hypertension: Secondary | ICD-10-CM | POA: Diagnosis not present

## 2024-10-16 MED ORDER — DILTIAZEM HCL ER COATED BEADS 180 MG PO CP24: 180.0000 mg | ORAL_CAPSULE | Freq: Every day | ORAL | 2 refills | Status: AC

## 2024-10-16 NOTE — Progress Notes (Signed)
" ° °  Subjective:    Patient ID: Abigail [name], female    DOB: 10/03/1945, 79 y.o.   MRN: [id number]  HPI Here for swelling in both feet and ankles that began 2 months ago. This goes down at night and comes back during the day. She has mild pain in the right lower leg. No SOB. She had an ECHO on 07-20-23 showing normal LV function. She had labs on 08-22-24 showing normal renal function. Her BP has been stable.    Review of Systems  Constitutional: Negative.   Respiratory: Negative.    Cardiovascular:  Positive for leg swelling. Negative for chest pain and palpitations.       Objective:   Physical Exam Constitutional:      Appearance: Normal appearance.  Cardiovascular:     Rate and Rhythm: Normal rate and regular rhythm.     Pulses: Normal pulses.     Heart sounds: Normal heart sounds.  Pulmonary:     Effort: Pulmonary effort is normal.     Breath sounds: Normal breath sounds.  Musculoskeletal:     Comments: 2+ edema in the right lower leg and 1+ edema in the left lower leg   Neurological:     Mental Status: She is alert.           Assessment & Plan:  Ankle edema, likely a side effect of the Amlodipine . We will stop this and start her on Diltiazem  CD 180 mg daily. Recheck here in 3 weeks. Garnette Olmsted, MD   "

## 2024-11-04 ENCOUNTER — Inpatient Hospital Stay

## 2024-11-04 ENCOUNTER — Inpatient Hospital Stay: Admitting: Nurse Practitioner

## 2024-11-05 ENCOUNTER — Ambulatory Visit: Admitting: Family Medicine

## 2024-11-06 ENCOUNTER — Ambulatory Visit: Admitting: Family Medicine

## 2024-11-26 ENCOUNTER — Other Ambulatory Visit

## 2025-01-16 ENCOUNTER — Ambulatory Visit: Admitting: Cardiology

## 2025-03-24 ENCOUNTER — Ambulatory Visit

## 2025-04-09 ENCOUNTER — Ambulatory Visit: Admitting: Neurology
